# Patient Record
Sex: Female | Born: 1965
Health system: Southern US, Community
[De-identification: ages and names within clinical notes are randomized; demographics above are authoritative.]

## PROBLEM LIST (undated history)

## (undated) DIAGNOSIS — I8500 Esophageal varices without bleeding: Secondary | ICD-10-CM

## (undated) DIAGNOSIS — K709 Alcoholic liver disease, unspecified: Secondary | ICD-10-CM

## (undated) DIAGNOSIS — R17 Unspecified jaundice: Secondary | ICD-10-CM

## (undated) DIAGNOSIS — K219 Gastro-esophageal reflux disease without esophagitis: Secondary | ICD-10-CM

## (undated) DIAGNOSIS — M549 Dorsalgia, unspecified: Secondary | ICD-10-CM

## (undated) DIAGNOSIS — R519 Headache, unspecified: Secondary | ICD-10-CM

## (undated) DIAGNOSIS — K7460 Unspecified cirrhosis of liver: Secondary | ICD-10-CM

## (undated) DIAGNOSIS — G8929 Other chronic pain: Secondary | ICD-10-CM

## (undated) DIAGNOSIS — J948 Other specified pleural conditions: Secondary | ICD-10-CM

## (undated) HISTORY — DX: Other specified pleural conditions: J94.8

## (undated) HISTORY — DX: Gastro-esophageal reflux disease without esophagitis: K21.9

## (undated) HISTORY — DX: Alcoholic liver disease, unspecified: K70.9

## (undated) HISTORY — DX: Dorsalgia, unspecified: M54.9

## (undated) HISTORY — DX: Unspecified jaundice: R17

## (undated) HISTORY — PX: BACK SURGERY: SHX140

## (undated) HISTORY — DX: Other chronic pain: G89.29

## (undated) HISTORY — DX: Unspecified cirrhosis of liver: K74.60

---

## 2016-12-31 DIAGNOSIS — K7460 Unspecified cirrhosis of liver: Secondary | ICD-10-CM | POA: Diagnosis not present

## 2016-12-31 DIAGNOSIS — K760 Fatty (change of) liver, not elsewhere classified: Secondary | ICD-10-CM | POA: Diagnosis not present

## 2016-12-31 DIAGNOSIS — R188 Other ascites: Secondary | ICD-10-CM | POA: Diagnosis not present

## 2016-12-31 DIAGNOSIS — D649 Anemia, unspecified: Secondary | ICD-10-CM | POA: Diagnosis not present

## 2016-12-31 DIAGNOSIS — R140 Abdominal distension (gaseous): Secondary | ICD-10-CM | POA: Diagnosis not present

## 2017-01-01 DIAGNOSIS — I340 Nonrheumatic mitral (valve) insufficiency: Secondary | ICD-10-CM | POA: Diagnosis not present

## 2017-01-01 DIAGNOSIS — K7460 Unspecified cirrhosis of liver: Secondary | ICD-10-CM | POA: Diagnosis not present

## 2017-01-01 DIAGNOSIS — I10 Essential (primary) hypertension: Secondary | ICD-10-CM | POA: Diagnosis not present

## 2017-01-01 DIAGNOSIS — R188 Other ascites: Secondary | ICD-10-CM | POA: Diagnosis not present

## 2017-01-01 DIAGNOSIS — D509 Iron deficiency anemia, unspecified: Secondary | ICD-10-CM | POA: Diagnosis not present

## 2017-01-01 DIAGNOSIS — Z7289 Other problems related to lifestyle: Secondary | ICD-10-CM | POA: Diagnosis not present

## 2017-01-01 DIAGNOSIS — F329 Major depressive disorder, single episode, unspecified: Secondary | ICD-10-CM | POA: Diagnosis not present

## 2017-01-02 DIAGNOSIS — K7460 Unspecified cirrhosis of liver: Secondary | ICD-10-CM | POA: Diagnosis not present

## 2017-01-02 DIAGNOSIS — F329 Major depressive disorder, single episode, unspecified: Secondary | ICD-10-CM | POA: Diagnosis not present

## 2017-01-02 DIAGNOSIS — Z7289 Other problems related to lifestyle: Secondary | ICD-10-CM | POA: Diagnosis not present

## 2017-01-02 DIAGNOSIS — D509 Iron deficiency anemia, unspecified: Secondary | ICD-10-CM | POA: Diagnosis not present

## 2017-01-02 DIAGNOSIS — I10 Essential (primary) hypertension: Secondary | ICD-10-CM | POA: Diagnosis not present

## 2017-01-03 DIAGNOSIS — I10 Essential (primary) hypertension: Secondary | ICD-10-CM | POA: Diagnosis not present

## 2017-01-03 DIAGNOSIS — F329 Major depressive disorder, single episode, unspecified: Secondary | ICD-10-CM | POA: Diagnosis not present

## 2017-01-03 DIAGNOSIS — D509 Iron deficiency anemia, unspecified: Secondary | ICD-10-CM | POA: Diagnosis not present

## 2017-01-03 DIAGNOSIS — K7460 Unspecified cirrhosis of liver: Secondary | ICD-10-CM | POA: Diagnosis not present

## 2017-01-03 DIAGNOSIS — Z7289 Other problems related to lifestyle: Secondary | ICD-10-CM | POA: Diagnosis not present

## 2017-01-04 DIAGNOSIS — Z7289 Other problems related to lifestyle: Secondary | ICD-10-CM | POA: Diagnosis not present

## 2017-01-04 DIAGNOSIS — D509 Iron deficiency anemia, unspecified: Secondary | ICD-10-CM | POA: Diagnosis not present

## 2017-01-04 DIAGNOSIS — F329 Major depressive disorder, single episode, unspecified: Secondary | ICD-10-CM | POA: Diagnosis not present

## 2017-01-04 DIAGNOSIS — K649 Unspecified hemorrhoids: Secondary | ICD-10-CM | POA: Diagnosis not present

## 2017-01-04 DIAGNOSIS — K2960 Other gastritis without bleeding: Secondary | ICD-10-CM | POA: Diagnosis not present

## 2017-01-04 DIAGNOSIS — I8500 Esophageal varices without bleeding: Secondary | ICD-10-CM | POA: Diagnosis not present

## 2017-01-04 DIAGNOSIS — I864 Gastric varices: Secondary | ICD-10-CM | POA: Diagnosis not present

## 2017-01-04 DIAGNOSIS — K2950 Unspecified chronic gastritis without bleeding: Secondary | ICD-10-CM | POA: Diagnosis not present

## 2017-01-04 DIAGNOSIS — K7460 Unspecified cirrhosis of liver: Secondary | ICD-10-CM | POA: Diagnosis not present

## 2017-01-04 DIAGNOSIS — K449 Diaphragmatic hernia without obstruction or gangrene: Secondary | ICD-10-CM | POA: Diagnosis not present

## 2017-01-04 DIAGNOSIS — I10 Essential (primary) hypertension: Secondary | ICD-10-CM | POA: Diagnosis not present

## 2017-01-05 DIAGNOSIS — D509 Iron deficiency anemia, unspecified: Secondary | ICD-10-CM | POA: Diagnosis not present

## 2017-01-05 DIAGNOSIS — K7460 Unspecified cirrhosis of liver: Secondary | ICD-10-CM | POA: Diagnosis not present

## 2017-01-06 DIAGNOSIS — D509 Iron deficiency anemia, unspecified: Secondary | ICD-10-CM | POA: Diagnosis not present

## 2017-01-06 DIAGNOSIS — K7460 Unspecified cirrhosis of liver: Secondary | ICD-10-CM | POA: Diagnosis not present

## 2017-01-06 DIAGNOSIS — R188 Other ascites: Secondary | ICD-10-CM | POA: Diagnosis not present

## 2017-01-06 DIAGNOSIS — K7689 Other specified diseases of liver: Secondary | ICD-10-CM | POA: Diagnosis not present

## 2017-01-15 DIAGNOSIS — Z5181 Encounter for therapeutic drug level monitoring: Secondary | ICD-10-CM | POA: Diagnosis not present

## 2017-01-15 DIAGNOSIS — H538 Other visual disturbances: Secondary | ICD-10-CM | POA: Diagnosis not present

## 2017-01-15 DIAGNOSIS — I10 Essential (primary) hypertension: Secondary | ICD-10-CM | POA: Diagnosis not present

## 2017-01-15 DIAGNOSIS — Z01118 Encounter for examination of ears and hearing with other abnormal findings: Secondary | ICD-10-CM | POA: Diagnosis not present

## 2017-01-15 DIAGNOSIS — D638 Anemia in other chronic diseases classified elsewhere: Secondary | ICD-10-CM | POA: Diagnosis not present

## 2017-01-15 DIAGNOSIS — K7460 Unspecified cirrhosis of liver: Secondary | ICD-10-CM | POA: Diagnosis not present

## 2017-01-15 DIAGNOSIS — Z136 Encounter for screening for cardiovascular disorders: Secondary | ICD-10-CM | POA: Diagnosis not present

## 2017-01-15 DIAGNOSIS — Z131 Encounter for screening for diabetes mellitus: Secondary | ICD-10-CM | POA: Diagnosis not present

## 2017-01-15 DIAGNOSIS — Z720 Tobacco use: Secondary | ICD-10-CM | POA: Diagnosis not present

## 2017-01-15 DIAGNOSIS — I119 Hypertensive heart disease without heart failure: Secondary | ICD-10-CM | POA: Diagnosis not present

## 2017-01-15 DIAGNOSIS — Z113 Encounter for screening for infections with a predominantly sexual mode of transmission: Secondary | ICD-10-CM | POA: Diagnosis not present

## 2017-08-11 DIAGNOSIS — R079 Chest pain, unspecified: Secondary | ICD-10-CM | POA: Diagnosis not present

## 2017-08-11 DIAGNOSIS — K922 Gastrointestinal hemorrhage, unspecified: Secondary | ICD-10-CM | POA: Diagnosis not present

## 2017-08-11 DIAGNOSIS — E872 Acidosis: Secondary | ICD-10-CM | POA: Diagnosis not present

## 2017-08-11 DIAGNOSIS — I959 Hypotension, unspecified: Secondary | ICD-10-CM | POA: Diagnosis not present

## 2017-08-11 DIAGNOSIS — K7030 Alcoholic cirrhosis of liver without ascites: Secondary | ICD-10-CM | POA: Diagnosis not present

## 2017-08-11 DIAGNOSIS — R58 Hemorrhage, not elsewhere classified: Secondary | ICD-10-CM | POA: Diagnosis not present

## 2017-08-11 DIAGNOSIS — D500 Iron deficiency anemia secondary to blood loss (chronic): Secondary | ICD-10-CM | POA: Diagnosis not present

## 2017-08-11 DIAGNOSIS — J9811 Atelectasis: Secondary | ICD-10-CM | POA: Diagnosis not present

## 2017-08-11 DIAGNOSIS — I8501 Esophageal varices with bleeding: Secondary | ICD-10-CM | POA: Diagnosis not present

## 2017-08-12 DIAGNOSIS — I8501 Esophageal varices with bleeding: Secondary | ICD-10-CM | POA: Diagnosis not present

## 2017-08-12 DIAGNOSIS — K7460 Unspecified cirrhosis of liver: Secondary | ICD-10-CM | POA: Diagnosis not present

## 2017-08-12 DIAGNOSIS — J9600 Acute respiratory failure, unspecified whether with hypoxia or hypercapnia: Secondary | ICD-10-CM | POA: Diagnosis not present

## 2017-08-12 DIAGNOSIS — K7030 Alcoholic cirrhosis of liver without ascites: Secondary | ICD-10-CM | POA: Diagnosis not present

## 2017-08-12 DIAGNOSIS — K922 Gastrointestinal hemorrhage, unspecified: Secondary | ICD-10-CM | POA: Diagnosis not present

## 2017-08-12 DIAGNOSIS — R188 Other ascites: Secondary | ICD-10-CM | POA: Diagnosis not present

## 2017-08-13 DIAGNOSIS — K922 Gastrointestinal hemorrhage, unspecified: Secondary | ICD-10-CM | POA: Diagnosis not present

## 2017-08-13 DIAGNOSIS — I8501 Esophageal varices with bleeding: Secondary | ICD-10-CM | POA: Diagnosis not present

## 2017-08-13 DIAGNOSIS — K7030 Alcoholic cirrhosis of liver without ascites: Secondary | ICD-10-CM | POA: Diagnosis not present

## 2017-08-13 DIAGNOSIS — I361 Nonrheumatic tricuspid (valve) insufficiency: Secondary | ICD-10-CM | POA: Diagnosis not present

## 2017-08-13 DIAGNOSIS — R188 Other ascites: Secondary | ICD-10-CM | POA: Diagnosis not present

## 2017-08-14 DIAGNOSIS — K922 Gastrointestinal hemorrhage, unspecified: Secondary | ICD-10-CM | POA: Diagnosis not present

## 2017-08-15 DIAGNOSIS — K922 Gastrointestinal hemorrhage, unspecified: Secondary | ICD-10-CM | POA: Diagnosis not present

## 2017-08-16 DIAGNOSIS — K922 Gastrointestinal hemorrhage, unspecified: Secondary | ICD-10-CM | POA: Diagnosis not present

## 2017-08-17 DIAGNOSIS — K922 Gastrointestinal hemorrhage, unspecified: Secondary | ICD-10-CM | POA: Diagnosis not present

## 2017-08-18 DIAGNOSIS — K922 Gastrointestinal hemorrhage, unspecified: Secondary | ICD-10-CM | POA: Diagnosis not present

## 2017-08-18 DIAGNOSIS — R4182 Altered mental status, unspecified: Secondary | ICD-10-CM | POA: Diagnosis not present

## 2017-08-20 DIAGNOSIS — K922 Gastrointestinal hemorrhage, unspecified: Secondary | ICD-10-CM | POA: Diagnosis not present

## 2017-08-21 DIAGNOSIS — K922 Gastrointestinal hemorrhage, unspecified: Secondary | ICD-10-CM | POA: Diagnosis not present

## 2017-08-22 DIAGNOSIS — K922 Gastrointestinal hemorrhage, unspecified: Secondary | ICD-10-CM | POA: Diagnosis not present

## 2017-08-23 DIAGNOSIS — K922 Gastrointestinal hemorrhage, unspecified: Secondary | ICD-10-CM | POA: Diagnosis not present

## 2017-08-24 DIAGNOSIS — K922 Gastrointestinal hemorrhage, unspecified: Secondary | ICD-10-CM | POA: Diagnosis not present

## 2017-08-25 DIAGNOSIS — K922 Gastrointestinal hemorrhage, unspecified: Secondary | ICD-10-CM | POA: Diagnosis not present

## 2017-08-26 DIAGNOSIS — K922 Gastrointestinal hemorrhage, unspecified: Secondary | ICD-10-CM | POA: Diagnosis not present

## 2017-09-29 ENCOUNTER — Ambulatory Visit: Payer: Medicare Other | Admitting: Family Medicine

## 2017-10-08 ENCOUNTER — Ambulatory Visit: Payer: Medicare Other | Admitting: Family Medicine

## 2017-10-26 ENCOUNTER — Ambulatory Visit (INDEPENDENT_AMBULATORY_CARE_PROVIDER_SITE_OTHER): Payer: Medicare Other | Admitting: Family Medicine

## 2017-10-26 ENCOUNTER — Encounter: Payer: Self-pay | Admitting: Family Medicine

## 2017-10-26 VITALS — BP 162/88 | HR 90 | Resp 16 | Ht 64.0 in

## 2017-10-26 DIAGNOSIS — Z7689 Persons encountering health services in other specified circumstances: Secondary | ICD-10-CM

## 2017-10-26 DIAGNOSIS — F17210 Nicotine dependence, cigarettes, uncomplicated: Secondary | ICD-10-CM

## 2017-10-26 DIAGNOSIS — Z8719 Personal history of other diseases of the digestive system: Secondary | ICD-10-CM

## 2017-10-26 DIAGNOSIS — K7030 Alcoholic cirrhosis of liver without ascites: Secondary | ICD-10-CM | POA: Diagnosis not present

## 2017-10-26 DIAGNOSIS — F17200 Nicotine dependence, unspecified, uncomplicated: Secondary | ICD-10-CM | POA: Insufficient documentation

## 2017-10-26 DIAGNOSIS — K7031 Alcoholic cirrhosis of liver with ascites: Secondary | ICD-10-CM | POA: Insufficient documentation

## 2017-10-26 LAB — COMPREHENSIVE METABOLIC PANEL
ALBUMIN: 3.8 g/dL (ref 3.5–5.2)
ALK PHOS: 180 U/L — AB (ref 39–117)
ALT: 17 U/L (ref 0–35)
AST: 50 U/L — AB (ref 0–37)
BILIRUBIN TOTAL: 1.4 mg/dL — AB (ref 0.2–1.2)
BUN: 8 mg/dL (ref 6–23)
CHLORIDE: 101 meq/L (ref 96–112)
CO2: 25 mEq/L (ref 19–32)
CREATININE: 0.57 mg/dL (ref 0.40–1.20)
Calcium: 9.4 mg/dL (ref 8.4–10.5)
GFR: 142.79 mL/min (ref >60.00)
Glucose, Bld: 83 mg/dL (ref 70–99)
Potassium: 3.9 mEq/L (ref 3.5–5.1)
SODIUM: 140 meq/L (ref 135–145)
TOTAL PROTEIN: 7.8 g/dL (ref 6.0–8.3)

## 2017-10-26 LAB — CBC WITH DIFFERENTIAL/PLATELET
BASOS ABS: 0 10*3/uL (ref 0.0–0.1)
BASOS PCT: 1.2 % (ref 0.0–3.0)
EOS ABS: 0.2 10*3/uL (ref 0.0–0.7)
Eosinophils Relative: 4.2 % (ref 0.0–5.0)
HEMATOCRIT: 34.8 % — AB (ref 36.0–46.0)
HEMOGLOBIN: 11.2 g/dL — AB (ref 12.0–15.0)
LYMPHS PCT: 37.2 % (ref 12.0–46.0)
Lymphs Abs: 1.3 10*3/uL (ref 0.7–4.0)
MCHC: 32.2 g/dL (ref 30.0–36.0)
MCV: 76.8 fl — ABNORMAL LOW (ref 78.0–100.0)
MONO ABS: 0.6 10*3/uL (ref 0.1–1.0)
Monocytes Relative: 16.3 % — ABNORMAL HIGH (ref 3.0–12.0)
Neutro Abs: 1.5 10*3/uL (ref 1.4–7.7)
Neutrophils Relative %: 41.1 % — ABNORMAL LOW (ref 43.0–77.0)
Platelets: 143 10*3/uL — ABNORMAL LOW (ref 150.0–400.0)
RBC: 4.54 Mil/uL (ref 3.87–5.11)
RDW: 20.8 % — AB (ref 11.5–15.5)
WBC: 3.6 10*3/uL — AB (ref 4.0–10.5)

## 2017-10-26 LAB — AMMONIA: AMMONIA: 85 umol/L — AB (ref 11–35)

## 2017-10-26 MED ORDER — LACTULOSE 20 GM/30ML PO SOLN: 30.0000 mL | Freq: Two times a day (BID) | ORAL | 3 refills | Status: DC

## 2017-10-26 MED ORDER — PANTOPRAZOLE SODIUM 40 MG PO TBEC: 40.0000 mg | DELAYED_RELEASE_TABLET | Freq: Every day | ORAL | 3 refills | Status: DC

## 2017-10-26 NOTE — Progress Notes (Signed)
Patient presents to clinic today to establish care.  Patient is accompanied by her daughter  SUBJECTIVE: PMH: Patient is a 52 year old female with past medical history significant for recent GI bleed, liver failure, cirrhosis, alcohol abuse, tobacco use.  Patient was briefly seen and [facility].  Patient endorses recent hospitalization for GIB while visiting [place], [place].  Alcoholic cirrhosis/ recent GIB: -Patient endorses ongoing history of alcohol use. -Currently drinking a sixpack of beer per day.  Patient's daughter thinks she is drinking more. -During recent hospitalization 08/26/2017 pt informed of liver damage 2/2 alcohol use she had 2/2 esophageal varices -Patient started on lactulose 30 mL 3 times daily, Protonix 40 mg daily, propranolol every 8 hours hold for systolic blood pressure less than 90, Spironolactone 50 mg daily, thiamine daily  Allergies: Sulfa-swelling  Past surgical history Back surgery-had rods put in place  Social history: Patient is single.  She has 8 children, 6 boys and 2 girls.  Patient has 2 grandchildren.  Patient is currently retired/disabled.  Patient endorses alcohol use.  Patient endorses recent tobacco use.  Smoking approximately 5 cigarettes/day.  Patient states 1 pack for last 3 to 4 days.  Patient denies drug use.  Family medical history: Mom-alive, thyroid issues, arthritis Dad-deceased MGM-cancer  Health Maintenance: Immunizations --pneumonia and influenza vaccine given during hospitalization March 2019 Mammogram --2 years ago PAP --2 years ago    Past Medical History:  Diagnosis Date  . Alcoholic liver disease ([place])   . Chronic back pain   . Cirrhosis of liver ([place])   . GERD (gastroesophageal reflux disease)   . Jaundice     Current Outpatient Medications on File Prior to Visit  Medication Sig Dispense Refill  . Lactulose 20 GM/30ML SOLN Take 30 mLs by mouth. Take 30 mls every eight hours. Titrate for 3 bm per  24 hrs    . pantoprazole (PROTONIX) 40 MG tablet Take 40 mg by mouth daily.    . propranolol (INDERAL) 10 MG tablet Take 10 mg by mouth every 8 (eight) hours. Hold for SBP <90    . spironolactone (ALDACTONE) 25 MG tablet Take 25 mg by mouth daily. Take 2 oral daily    . Thiamine Mononitrate 100 MG TABS Take by mouth.     No current facility-administered medications on file prior to visit.     Allergies  Allergen Reactions  . Contrast Media [Iodinated Diagnostic Agents]   . Latex   . Sulfa Antibiotics     No family history on file.  Social History   Socioeconomic History  . Marital status: Single    Spouse name: Not on file  . Number of children: Not on file  . Years of education: Not on file  . Highest education level: Not on file  Occupational History  . Not on file  Social Needs  . Financial resource strain: Not on file  . Food insecurity:    Worry: Not on file    Inability: Not on file  . Transportation needs:    Medical: Not on file    Non-medical: Not on file  Tobacco Use  . Smoking status: Current Every Day Smoker  . Smokeless tobacco: Never Used  Substance and Sexual Activity  . Alcohol use: Yes  . Drug use: Not Currently  . Sexual activity: Not on file  Lifestyle  . Physical activity:    Days per week: Not on file    Minutes per session: Not on file  . Stress:  Not on file  Relationships  . Social connections:    Talks on phone: Not on file    Gets together: Not on file    Attends religious service: Not on file    Active member of club or organization: Not on file    Attends meetings of clubs or organizations: Not on file    Relationship status: Not on file  . Intimate partner violence:    Fear of current or ex partner: Not on file    Emotionally abused: Not on file    Physically abused: Not on file    Forced sexual activity: Not on file  Other Topics Concern  . Not on file  Social History Narrative  . Not on file    ROS General: Denies  fever, chills, night sweats, changes in weight, changes in appetite HEENT: Denies headaches, ear pain, changes in vision, rhinorrhea, sore throat CV: Denies CP, palpitations, SOB, orthopnea Pulm: Denies SOB, cough, wheezing GI: Denies abdominal pain, nausea, vomiting, diarrhea, constipation GU: Denies dysuria, hematuria, frequency, vaginal discharge Msk: Denies muscle cramps, joint pains Neuro: Denies weakness, numbness, tingling Skin: Denies rashes, bruising Psych: Denies depression, anxiety, hallucinations  BP (!) 162/88 (BP Location: Left Arm, Patient Position: Sitting, Cuff Size: Large)   Pulse 90   Resp 16   Ht 5\' 4"  (1.626 m)   SpO2 97%   Physical Exam Gen. Pleasant, well developed, well-nourished, in NAD HEENT - [place]/AT, PERRL, conjunctive clear, no scleral icterus, no nasal drainage, pharynx without erythema or exudate.  TMs normal bilaterally.  No cervical lymphadenopathy. Neck: No JVD, no thyromegaly, no carotid bruits Lungs: no use of accessory muscles, CTAB, no wheezes, rales or rhonchi Cardiovascular: RRR, No r/g/m, no peripheral edema Abdomen: BS present, soft, nontender,nondistended, no hepatosplenomegaly Neuro:  A&Ox3, CN II-XII intact, normal gait Skin:  Warm, dry, intact, no lesions Psych: normal affect, mood appropriate, patient hesitant/in denial about her current health problems, tearful at times  No results found for this or any previous visit (from the past 2160 hour(s)).  Assessment/Plan: Alcoholic cirrhosis of liver without ascites ([place]) -Patient advised to refrain from alcohol use.  Patient advised she may have to cut down the amount she is drinking daily to avoid DTs. -Given handout -We will obtain labs this visit  - Plan: Ambulatory referral to Gastroenterology, Comprehensive metabolic panel, CBC with Differential/Platelet, Ammonia  History of GI bleed -Continue Protonix 40 mg daily -Patient given RTC or ED precautions for recurrent bleeding -We will  place referral for GI  Nicotine use -Smoking cessation counseling greater than 3 minutes, less than 10 minutes -Patient smoking approximately 5 cigarettes/day -Discussed cutting down -Patient declines quit aids at this time. -We will readdress at each visit  Encounter to establish care -We reviewed the PMH, PSH, FH, SH, Meds and Allergies. -We provided refills for any medications we will prescribe as needed. -We addressed current concerns per orders and patient instructions. -We have asked for records for pertinent exams, studies, vaccines and notes from previous providers. -We have advised patient to follow up per instructions below.  Follow-up in the next month, sooner if needed  Grier Mitts, MD

## 2017-10-26 NOTE — Patient Instructions (Signed)
Cirrhosis Cirrhosis is long-term (chronic) liver injury. The liver is your largest internal organ, and it performs many functions. The liver converts food into energy, removes toxic material from your blood, makes important proteins, and absorbs necessary vitamins from your diet. If you have cirrhosis, it means many of your healthy liver cells have been replaced by scar tissue. This prevents blood from flowing through your liver, which makes it difficult for your liver to function. This scarring is not reversible, but treatment can prevent it from getting worse. What are the causes? Hepatitis C and long-term alcohol abuse are the most common causes of cirrhosis. Other causes include:  Nonalcoholic fatty liver disease.  Hepatitis B infection.  Autoimmune hepatitis.  Diseases that cause blockage of ducts inside the liver.  Inherited liver diseases.  Reactions to certain long-term medicines.  Parasitic infections.  Long-term exposure to certain toxins.  What increases the risk? You may have a higher risk of cirrhosis if you:  Have certain hepatitis viruses.  Abuse alcohol, especially if you are female.  Are overweight.  Share needles.  Have unprotected sex with someone who has hepatitis.  What are the signs or symptoms? You may not have any signs and symptoms at first. Symptoms may not develop until the damage to your liver starts to get worse. Signs and symptoms of cirrhosis may include:  Tenderness in the right-upper part of your abdomen.  Weakness and tiredness (fatigue).  Loss of appetite.  Nausea.  Weight loss and muscle loss.  Itchiness.  Yellow skin and eyes (jaundice).  Buildup of fluid in the abdomen (ascites).  Swelling of the feet and ankles (edema).  Appearance of tiny blood vessels under the skin.  Mental confusion.  Easy bruising and bleeding.  How is this diagnosed? Your health care provider may suspect cirrhosis based on your symptoms and  medical history, especially if you have other medical conditions or a history of alcohol abuse. Your health care provider will do a physical exam to feel your liver and check for signs of cirrhosis. Your health care provider may perform other tests, including:  Blood tests to check: ? Whether you have hepatitis B or C. ? Kidney function. ? Liver function.  Imaging tests such as: ? MRI or CT scan to look for changes seen in advanced cirrhosis. ? Ultrasound to see if normal liver tissue is being replaced by scar tissue.  A procedure using a long needle to take a sample of liver tissue (biopsy) for examination under a microscope. Liver biopsy can confirm the diagnosis of cirrhosis.  How is this treated? Treatment depends on how damaged your liver is and what caused the damage. Treatment may include treating cirrhosis symptoms or treating the underlying causes of the condition to try to slow the progression of the damage. Treatment may include:  Making lifestyle changes, such as: ? Eating a healthy diet. ? Restricting salt intake. ? Maintaining a healthy weight. ? Not abusing drugs or alcohol.  Taking medicines to: ? Treat liver infections or other infections. ? Control itching. ? Reduce fluid buildup. ? Reduce certain blood toxins. ? Reduce risk of bleeding from enlarged blood vessels in the stomach or esophagus (varices).  If varices are causing bleeding problems, you may need treatment with a procedure that ties up the vessels causing them to fall off (band ligation).  If cirrhosis is causing your liver to fail, your health care provider may recommend a liver transplant.  Other treatments may be recommended depending on any complications   of cirrhosis, such as liver-related kidney failure (hepatorenal syndrome).  Follow these instructions at home:  Take medicines only as directed by your health care provider. Do not use drugs that are toxic to your liver. Ask your health care  provider before taking any new medicines, including over-the-counter medicines.  Rest as needed.  Eat a well-balanced diet. Ask your health care provider or dietitian for more information.  You may have to follow a low-salt diet or restrict your water intake as directed.  Do not drink alcohol. This is especially important if you are taking acetaminophen.  Keep all follow-up visits as directed by your health care provider. This is important. Contact a health care provider if:  You have fatigue or weakness that is getting worse.  You develop swelling of the hands, feet, legs, or face.  You have a fever.  You develop loss of appetite.  You have nausea or vomiting.  You develop jaundice.  You develop easy bruising or bleeding. Get help right away if:  You vomit bright red blood or a material that looks like coffee grounds.  You have blood in your stools.  Your stools appear black and tarry.  You become confused.  You have chest pain or trouble breathing. This information is not intended to replace advice given to you by your health care provider. Make sure you discuss any questions you have with your health care provider. Document Released: 06/09/2005 Document Revised: 10/18/2015 Document Reviewed: 02/15/2014 Elsevier Interactive Patient Education  2018 [employer]. Alcohol Abuse and Nutrition Alcohol abuse is any pattern of alcohol consumption that harms your health, relationships, or work. Alcohol abuse can affect how your body breaks down and absorbs nutrients from food by causing your liver to work abnormally. Additionally, many people who abuse alcohol do not eat enough carbohydrates, protein, fat, vitamins, and minerals. This can cause poor nutrition (malnutrition) and a lack of nutrients (nutrient deficiencies), which can lead to further complications. Nutrients that are commonly lacking (deficient) among people who abuse alcohol include:  Vitamins. ? Vitamin A. This  is stored in your liver. It is important for your vision, metabolism, and ability to fight off infections (immunity). ? B vitamins. These include vitamins such as folate, thiamin, and niacin. These are important in new cell growth and maintenance. ? Vitamin C. This plays an important role in iron absorption, wound healing, and immunity. ? Vitamin D. This is produced by your liver, but you can also get vitamin D from food. Vitamin D is necessary for your body to absorb and use calcium.  Minerals. ? Calcium. This is important for your bones and your heart and blood vessel (cardiovascular) function. ? Iron. This is important for blood, muscle, and nervous system functioning. ? Magnesium. This plays an important role in muscle and nerve function, and it helps to control blood sugar and blood pressure. ? Zinc. This is important for the normal function of your nervous system and digestive system (gastrointestinal tract).  Nutrition is an essential component of therapy for alcohol abuse. Your health care provider or dietitian will work with you to design a plan that can help restore nutrients to your body and prevent potential complications. What is my plan? Your dietitian may develop a specific diet plan that is based on your condition and any other complications you may have. A diet plan will commonly include:  A balanced diet. ? Grains: 6-8 oz per day. ? Vegetables: 2-3 cups per day. ? Fruits: 1-2 cups per day. ?  Meat and other protein: 5-6 oz per day. ? Dairy: 2-3 cups per day.  Vitamin and mineral supplements.  What do I need to know about alcohol and nutrition?  Consume foods that are high in antioxidants, such as grapes, berries, nuts, green tea, and dark green and orange vegetables. This can help to counteract some of the stress that is placed on your liver by consuming alcohol.  Avoid food and drinks that are high in fat and sugar. Foods such as sugared soft drinks, salty snack foods,  and candy contain empty calories. This means that they lack important nutrients such as protein, fiber, and vitamins.  Eat frequent meals and snacks. Try to eat 5-6 small meals each day.  Eat a variety of fresh fruits and vegetables each day. This will help you get plenty of water, fiber, and vitamins in your diet.  Drink plenty of water and other clear fluids. Try to drink at least 48-64 oz (1.5-2 L) of water per day.  If you are a vegetarian, eat a variety of protein-rich foods. Pair whole grains with plant-based proteins at meals and snacks to obtain the greatest nutrient benefit from your food. For example, eat rice with beans, put peanut butter on whole-grain toast, or eat oatmeal with sunflower seeds.  Soak beans and whole grains overnight before cooking. This can help your body to absorb the nutrients more easily.  Include foods fortified with vitamins and minerals in your diet. Commonly fortified foods include milk, orange juice, cereal, and bread.  If you are malnourished, your dietitian may recommend a high-protein, high-calorie diet. This may include: ? 2,000-3,000 calories (kilocalories) per day. ? 70-100 grams of protein per day.  Your health care provider may recommend a complete nutritional supplement beverage. This can help to restore calories, protein, and vitamins to your body. Depending on your condition, you may be advised to consume this instead of or in addition to meals.  Limit your intake of caffeine. Replace drinks like coffee and black tea with decaffeinated coffee and herbal tea.  Eat a variety of foods that are high in omega fatty acids. These include fish, nuts and seeds, and soybeans. These foods may help your liver to recover and may also stabilize your mood.  Certain medicines may cause changes in your appetite, taste, and weight. Work with your health care provider and dietitian to make any adjustments to your medicines and diet plan.  Include other healthy  lifestyle choices in your daily routine. ? Be physically active. ? Get enough sleep. ? Spend time doing activities that you enjoy.  If you are unable to take in enough food and calories by mouth, your health care provider may recommend a feeding tube. This is a tube that passes through your nose and throat, directly into your stomach. Nutritional supplement beverages can be given to you through the feeding tube to help you get the nutrients you need.  Take vitamin or mineral supplements as recommended by your health care provider. What foods can I eat? Grains Enriched pasta. Enriched rice. Fortified whole-grain bread. Fortified whole-grain cereal. Barley. Brown rice. Quinoa. [place]. Vegetables All fresh, frozen, and canned vegetables. Spinach. Kale. Artichoke. Carrots. Winter squash and pumpkin. Sweet potatoes. Broccoli. Cabbage. Cucumbers. Tomatoes. Sweet peppers. Green beans. Peas. Corn. Fruits All fresh and frozen fruits. Berries. Grapes. Mango. Papaya. Guava. Cherries. Apples. Bananas. Peaches. Plums. Pineapple. Watermelon. Cantaloupe. Oranges. Avocado. Meats and Other Protein Sources Beef liver. Lean beef. Pork. Fresh and canned chicken. Fresh fish. Oysters. Sardines.  Canned tuna. Shrimp. Eggs with yolks. Nuts and seeds. Peanut butter. Beans and lentils. Soybeans. Tofu. Dairy Whole, low-fat, and nonfat milk. Whole, low-fat, and nonfat yogurt. Cottage cheese. Sour cream. Hard and soft cheeses. Beverages Water. Herbal tea. Decaffeinated coffee. Decaffeinated green tea. 100% fruit juice. 100% vegetable juice. Instant breakfast shakes. Condiments Ketchup. Mayonnaise. Mustard. Salad dressing. Barbecue sauce. Sweets and Desserts Sugar-free ice cream. Sugar-free pudding. Sugar-free gelatin. Fats and Oils Butter. Vegetable oil, flaxseed oil, olive oil, and walnut oil. Other Complete nutrition shakes. Protein bars. Sugar-free gum. The items listed above may not be a complete list of  recommended foods or beverages. Contact your dietitian for more options. What foods are not recommended? Grains Sugar-sweetened breakfast cereals. Flavored instant oatmeal. Fried breads. Vegetables Breaded or deep-fried vegetables. Fruits Dried fruit with added sugar. Candied fruit. Canned fruit in syrup. Meats and Other Protein Sources Breaded or deep-fried meats. Dairy Flavored milks. Fried cheese curds or fried cheese sticks. Beverages Alcohol. Sugar-sweetened soft drinks. Sugar-sweetened tea. Caffeinated coffee and tea. Condiments Sugar. Honey. Agave nectar. Molasses. Sweets and Desserts Chocolate. Cake. Cookies. Candy. Other Potato chips. Pretzels. Salted nuts. Candied nuts. The items listed above may not be a complete list of foods and beverages to avoid. Contact your dietitian for more information. This information is not intended to replace advice given to you by your health care provider. Make sure you discuss any questions you have with your health care provider. Document Released: 04/03/2005 Document Revised: 10/17/2015 Document Reviewed: 01/10/2014 Elsevier Interactive Patient Education  [employer].

## 2017-10-27 ENCOUNTER — Encounter: Payer: Self-pay | Admitting: Internal Medicine

## 2017-12-28 ENCOUNTER — Ambulatory Visit: Payer: Medicare Other | Admitting: Internal Medicine

## 2017-12-28 ENCOUNTER — Encounter: Payer: Self-pay | Admitting: Family Medicine

## 2018-04-24 ENCOUNTER — Encounter (HOSPITAL_COMMUNITY): Payer: Self-pay | Admitting: Emergency Medicine

## 2018-04-24 ENCOUNTER — Inpatient Hospital Stay (HOSPITAL_COMMUNITY)
Admission: EM | Admit: 2018-04-24 | Discharge: 2018-05-03 | DRG: 432 | Disposition: A | Discharge status: Home Health | Payer: Medicare Other | Attending: Internal Medicine | Admitting: Internal Medicine

## 2018-04-24 DIAGNOSIS — K7011 Alcoholic hepatitis with ascites: Secondary | ICD-10-CM | POA: Diagnosis present

## 2018-04-24 DIAGNOSIS — Z79899 Other long term (current) drug therapy: Secondary | ICD-10-CM

## 2018-04-24 DIAGNOSIS — K7290 Hepatic failure, unspecified without coma: Secondary | ICD-10-CM | POA: Diagnosis not present

## 2018-04-24 DIAGNOSIS — I8501 Esophageal varices with bleeding: Secondary | ICD-10-CM | POA: Diagnosis not present

## 2018-04-24 DIAGNOSIS — F1010 Alcohol abuse, uncomplicated: Secondary | ICD-10-CM | POA: Diagnosis not present

## 2018-04-24 DIAGNOSIS — F10239 Alcohol dependence with withdrawal, unspecified: Secondary | ICD-10-CM | POA: Diagnosis present

## 2018-04-24 DIAGNOSIS — K7460 Unspecified cirrhosis of liver: Secondary | ICD-10-CM | POA: Diagnosis not present

## 2018-04-24 DIAGNOSIS — R188 Other ascites: Secondary | ICD-10-CM | POA: Diagnosis not present

## 2018-04-24 DIAGNOSIS — D62 Acute posthemorrhagic anemia: Secondary | ICD-10-CM | POA: Diagnosis present

## 2018-04-24 DIAGNOSIS — F1020 Alcohol dependence, uncomplicated: Secondary | ICD-10-CM | POA: Diagnosis not present

## 2018-04-24 DIAGNOSIS — D696 Thrombocytopenia, unspecified: Secondary | ICD-10-CM | POA: Diagnosis present

## 2018-04-24 DIAGNOSIS — R000 Tachycardia, unspecified: Secondary | ICD-10-CM | POA: Diagnosis not present

## 2018-04-24 DIAGNOSIS — I8511 Secondary esophageal varices with bleeding: Secondary | ICD-10-CM | POA: Diagnosis not present

## 2018-04-24 DIAGNOSIS — I8500 Esophageal varices without bleeding: Secondary | ICD-10-CM | POA: Diagnosis not present

## 2018-04-24 DIAGNOSIS — G8929 Other chronic pain: Secondary | ICD-10-CM | POA: Diagnosis present

## 2018-04-24 DIAGNOSIS — E872 Acidosis, unspecified: Secondary | ICD-10-CM

## 2018-04-24 DIAGNOSIS — K219 Gastro-esophageal reflux disease without esophagitis: Secondary | ICD-10-CM | POA: Diagnosis not present

## 2018-04-24 DIAGNOSIS — K922 Gastrointestinal hemorrhage, unspecified: Secondary | ICD-10-CM | POA: Diagnosis not present

## 2018-04-24 DIAGNOSIS — R109 Unspecified abdominal pain: Secondary | ICD-10-CM | POA: Diagnosis present

## 2018-04-24 DIAGNOSIS — R791 Abnormal coagulation profile: Secondary | ICD-10-CM | POA: Diagnosis present

## 2018-04-24 DIAGNOSIS — E7220 Disorder of urea cycle metabolism, unspecified: Secondary | ICD-10-CM | POA: Diagnosis not present

## 2018-04-24 DIAGNOSIS — F17210 Nicotine dependence, cigarettes, uncomplicated: Secondary | ICD-10-CM | POA: Diagnosis present

## 2018-04-24 DIAGNOSIS — Z882 Allergy status to sulfonamides status: Secondary | ICD-10-CM

## 2018-04-24 DIAGNOSIS — K7031 Alcoholic cirrhosis of liver with ascites: Principal | ICD-10-CM

## 2018-04-24 DIAGNOSIS — T424X5A Adverse effect of benzodiazepines, initial encounter: Secondary | ICD-10-CM | POA: Diagnosis not present

## 2018-04-24 DIAGNOSIS — K920 Hematemesis: Secondary | ICD-10-CM

## 2018-04-24 DIAGNOSIS — Y92230 Patient room in hospital as the place of occurrence of the external cause: Secondary | ICD-10-CM | POA: Diagnosis not present

## 2018-04-24 DIAGNOSIS — E876 Hypokalemia: Secondary | ICD-10-CM

## 2018-04-24 DIAGNOSIS — Z91040 Latex allergy status: Secondary | ICD-10-CM

## 2018-04-24 DIAGNOSIS — K3189 Other diseases of stomach and duodenum: Secondary | ICD-10-CM | POA: Diagnosis not present

## 2018-04-24 DIAGNOSIS — Z91041 Radiographic dye allergy status: Secondary | ICD-10-CM

## 2018-04-24 DIAGNOSIS — K921 Melena: Secondary | ICD-10-CM

## 2018-04-24 DIAGNOSIS — E871 Hypo-osmolality and hyponatremia: Secondary | ICD-10-CM

## 2018-04-24 DIAGNOSIS — M549 Dorsalgia, unspecified: Secondary | ICD-10-CM | POA: Diagnosis present

## 2018-04-24 DIAGNOSIS — D649 Anemia, unspecified: Secondary | ICD-10-CM

## 2018-04-24 DIAGNOSIS — E43 Unspecified severe protein-calorie malnutrition: Secondary | ICD-10-CM | POA: Diagnosis present

## 2018-04-24 DIAGNOSIS — K7030 Alcoholic cirrhosis of liver without ascites: Secondary | ICD-10-CM

## 2018-04-24 LAB — CBC WITH DIFFERENTIAL/PLATELET
Abs Immature Granulocytes: 0.02 10*3/uL (ref 0.00–0.07)
Basophils Absolute: 0 10*3/uL (ref 0.0–0.1)
Basophils Relative: 1 %
Eosinophils Absolute: 0 10*3/uL (ref 0.0–0.5)
Eosinophils Relative: 1 %
HCT: 28.2 % — ABNORMAL LOW (ref 36.0–46.0)
Hemoglobin: 9.6 g/dL — ABNORMAL LOW (ref 12.0–15.0)
Immature Granulocytes: 0 %
Lymphocytes Relative: 19 %
Lymphs Abs: 1.1 10*3/uL (ref 0.7–4.0)
MCH: 29.3 pg (ref 26.0–34.0)
MCHC: 34 g/dL (ref 30.0–36.0)
MCV: 86 fL (ref 80.0–100.0)
Monocytes Absolute: 0.8 10*3/uL (ref 0.1–1.0)
Monocytes Relative: 14 %
Neutro Abs: 4 10*3/uL (ref 1.7–7.7)
Neutrophils Relative %: 65 %
Platelets: DECREASED 10*3/uL (ref 150–400)
RBC: 3.28 MIL/uL — ABNORMAL LOW (ref 3.87–5.11)
RDW: 22.4 % — ABNORMAL HIGH (ref 11.5–15.5)
WBC: 6.1 10*3/uL (ref 4.0–10.5)
nRBC: 0 % (ref 0.0–0.2)

## 2018-04-24 LAB — I-STAT BETA HCG BLOOD, ED (MC, WL, AP ONLY): I-stat hCG, quantitative: <5 m[IU]/mL (ref <5)

## 2018-04-24 LAB — COMPREHENSIVE METABOLIC PANEL
ALT: 32 U/L (ref 0–44)
AST: 80 U/L — ABNORMAL HIGH (ref 15–41)
Albumin: 1.9 g/dL — ABNORMAL LOW (ref 3.5–5.0)
Alkaline Phosphatase: 184 U/L — ABNORMAL HIGH (ref 38–126)
Anion gap: 15 (ref 5–15)
BUN: 7 mg/dL (ref 6–20)
CO2: 19 mmol/L — ABNORMAL LOW (ref 22–32)
Calcium: 8.4 mg/dL — ABNORMAL LOW (ref 8.9–10.3)
Chloride: 99 mmol/L (ref 98–111)
Creatinine, Ser: 0.9 mg/dL (ref 0.44–1.00)
GFR calc Af Amer: >60 mL/min (ref >60)
GFR calc non Af Amer: >60 mL/min (ref >60)
Glucose, Bld: 95 mg/dL (ref 70–99)
Potassium: 3.3 mmol/L — ABNORMAL LOW (ref 3.5–5.1)
Sodium: 133 mmol/L — ABNORMAL LOW (ref 135–145)
Total Bilirubin: 8.9 mg/dL — ABNORMAL HIGH (ref 0.3–1.2)
Total Protein: 6.7 g/dL (ref 6.5–8.1)

## 2018-04-24 LAB — CBC
HCT: 27.1 % — ABNORMAL LOW (ref 36.0–46.0)
HEMOGLOBIN: 9.3 g/dL — AB (ref 12.0–15.0)
MCH: 29.5 pg (ref 26.0–34.0)
MCHC: 34.3 g/dL (ref 30.0–36.0)
MCV: 86 fL (ref 80.0–100.0)
NRBC: 0 % (ref 0.0–0.2)
PLATELETS: 81 10*3/uL — AB (ref 150–400)
RBC: 3.15 MIL/uL — ABNORMAL LOW (ref 3.87–5.11)
RDW: 22.5 % — ABNORMAL HIGH (ref 11.5–15.5)
WBC: 6.2 10*3/uL (ref 4.0–10.5)

## 2018-04-24 LAB — PROTIME-INR
INR: 2.4
PROTHROMBIN TIME: 25.8 s — AB (ref 11.4–15.2)

## 2018-04-24 LAB — LIPASE, BLOOD: Lipase: 24 U/L (ref 11–51)

## 2018-04-24 MED ORDER — ONDANSETRON HCL 4 MG/2ML IJ SOLN
4.0000 mg | Freq: Four times a day (QID) | INTRAMUSCULAR | Status: DC | PRN
  Administered 2018-04-25 – 2018-04-28 (×2): 4 mg via INTRAVENOUS
  Filled 2018-04-24 (×2): qty 2

## 2018-04-24 MED ORDER — SODIUM CHLORIDE 0.9 % IV SOLN
50.0000 ug/h | INTRAVENOUS | Status: DC
  Administered 2018-04-24 – 2018-04-28 (×8): 50 ug/h via INTRAVENOUS
  Filled 2018-04-24 (×16): qty 1

## 2018-04-24 MED ORDER — VITAMIN K1 10 MG/ML IJ SOLN
10.0000 mg | Freq: Once | INTRAVENOUS | Status: AC
  Administered 2018-04-24: 10 mg via INTRAVENOUS
  Filled 2018-04-24: qty 1

## 2018-04-24 MED ORDER — ONDANSETRON HCL 4 MG/2ML IJ SOLN
4.0000 mg | Freq: Once | INTRAMUSCULAR | Status: AC
  Administered 2018-04-24: 4 mg via INTRAVENOUS
  Filled 2018-04-24: qty 2

## 2018-04-24 MED ORDER — PANTOPRAZOLE SODIUM 40 MG IV SOLR
40.0000 mg | Freq: Once | INTRAVENOUS | Status: AC
  Administered 2018-04-24: 40 mg via INTRAVENOUS
  Filled 2018-04-24: qty 40

## 2018-04-24 MED ORDER — PANTOPRAZOLE SODIUM 40 MG IV SOLR
40.0000 mg | Freq: Two times a day (BID) | INTRAVENOUS | Status: DC
  Administered 2018-04-25 – 2018-04-29 (×9): 40 mg via INTRAVENOUS
  Filled 2018-04-24 (×9): qty 40

## 2018-04-24 MED ORDER — LORAZEPAM 2 MG/ML IJ SOLN
2.0000 mg | INTRAMUSCULAR | Status: DC | PRN
  Administered 2018-04-25 – 2018-04-26 (×6): 2 mg via INTRAVENOUS
  Filled 2018-04-24: qty 1
  Filled 2018-04-24: qty 2
  Filled 2018-04-24 (×3): qty 1

## 2018-04-24 MED ORDER — POTASSIUM CHLORIDE CRYS ER 20 MEQ PO TBCR
40.0000 meq | EXTENDED_RELEASE_TABLET | Freq: Once | ORAL | Status: AC
  Administered 2018-04-24: 40 meq via ORAL
  Filled 2018-04-24: qty 2

## 2018-04-24 MED ORDER — LORAZEPAM 1 MG PO TABS: 1.0000 mg | ORAL_TABLET | Freq: Four times a day (QID) | ORAL | Status: DC | PRN

## 2018-04-24 MED ORDER — MORPHINE SULFATE (PF) 4 MG/ML IV SOLN
4.0000 mg | Freq: Once | INTRAVENOUS | Status: AC
  Administered 2018-04-24: 4 mg via INTRAVENOUS
  Filled 2018-04-24: qty 1

## 2018-04-24 MED ORDER — OCTREOTIDE LOAD VIA INFUSION
50.0000 ug | Freq: Once | INTRAVENOUS | Status: AC
  Administered 2018-04-24: 50 ug via INTRAVENOUS
  Filled 2018-04-24: qty 25

## 2018-04-24 MED ORDER — ADULT MULTIVITAMIN W/MINERALS CH
1.0000 | ORAL_TABLET | Freq: Every day | ORAL | Status: DC
  Administered 2018-04-26 – 2018-05-03 (×8): 1 via ORAL
  Filled 2018-04-24 (×8): qty 1

## 2018-04-24 MED ORDER — BARIUM SULFATE 2.1 % PO SUSP
ORAL | Status: AC
  Filled 2018-04-24: qty 2

## 2018-04-24 MED ORDER — FENTANYL CITRATE (PF) 100 MCG/2ML IJ SOLN
25.0000 ug | INTRAMUSCULAR | Status: DC | PRN
  Administered 2018-04-25: 25 ug via INTRAVENOUS
  Filled 2018-04-24: qty 2

## 2018-04-24 MED ORDER — FENTANYL CITRATE (PF) 100 MCG/2ML IJ SOLN
50.0000 ug | Freq: Once | INTRAMUSCULAR | Status: AC
  Administered 2018-04-24: 50 ug via INTRAVENOUS
  Filled 2018-04-24: qty 2

## 2018-04-24 MED ORDER — SODIUM CHLORIDE 0.9 % IV SOLN
1.0000 g | INTRAVENOUS | Status: DC
  Administered 2018-04-24 – 2018-04-30 (×6): 1 g via INTRAVENOUS
  Filled 2018-04-24 (×6): qty 10

## 2018-04-24 MED ORDER — THIAMINE HCL 100 MG/ML IJ SOLN
100.0000 mg | Freq: Every day | INTRAMUSCULAR | Status: DC
  Administered 2018-04-25 – 2018-04-26 (×2): 100 mg via INTRAVENOUS
  Filled 2018-04-24 (×5): qty 2

## 2018-04-24 MED ORDER — FOLIC ACID 1 MG PO TABS
1.0000 mg | ORAL_TABLET | Freq: Every day | ORAL | Status: DC
  Administered 2018-04-26 – 2018-05-03 (×8): 1 mg via ORAL
  Filled 2018-04-24 (×9): qty 1

## 2018-04-24 MED ORDER — LORAZEPAM 2 MG/ML IJ SOLN: 1.0000 mg | Freq: Four times a day (QID) | INTRAMUSCULAR | Status: DC | PRN

## 2018-04-24 MED ORDER — VITAMIN B-1 100 MG PO TABS
100.0000 mg | ORAL_TABLET | Freq: Every day | ORAL | Status: DC
  Administered 2018-04-27 – 2018-05-03 (×7): 100 mg via ORAL
  Filled 2018-04-24 (×9): qty 1

## 2018-04-24 NOTE — ED Triage Notes (Signed)
Pt here for eval of abdominal distention for 1 week with diarrhea. Pt also having nausea vomiting. Hx of liver cirrhosis.

## 2018-04-24 NOTE — H&P (Addendum)
History and Physical    Katie Holland VQM:[id number] DOB: [birth year] DOA: 04/24/2018  PCP: Billie Ruddy, MD Patient coming from: Home  Chief Complaint: Abdominal pain, blood in vomit and stool  HPI: Katie Holland is a 52 y.o. female with medical history significant of alcoholic liver cirrhosis, GERD presenting to the hospital for evaluation of abdominal pain and blood in vomit and stool.  Patient reports having severe right upper quadrant abdominal pain and abdominal distention for the past 2 days.  Reports having 2 episodes of nonbilious vomit with streaks of blood and 2 episodes of diarrhea mixed with blood in the past 2 days.  Also reports having chills.  States her appetite is okay.  States she was diagnosed with alcoholic liver cirrhosis during a previous hospitalization in [place] in March 2019.  Discharge document from the hospital showing that the patient was diagnosed with esophageal varices at that time.  She reports drinking alcohol for the past 15 years.  She drinks up to a sixpack of beer twice a week.  Her last drink was yesterday.  ED Course: Tachycardic (pulse in 100s to 120s).  Not hypotensive.  Hemoglobin 9.6, was 11.2 six months ago (no recent baseline in chart).  No platelet count reported due to clumping on smear. AST 80, ALT 32, alk phos 184, and T bili 8.9.  Lipase normal.  INR 2.4.  CT abdomen pelvis without contrast pending.  ED physician discussed the case with Dr. Luan Pulling from gastroenterology, recommendation is to get octreotide and IV vitamin K 10 mg.  GI will see the patient in the morning. [facility] paged to admit.   Review of Systems: As per HPI otherwise 10 point review of systems negative.  Past Medical History:  Diagnosis Date  . Alcoholic liver disease ([place])   . Chronic back pain   . Cirrhosis of liver ([place])   . GERD (gastroesophageal reflux disease)   . Jaundice     Past Surgical History:  Procedure Laterality Date  . BACK SURGERY       reports that  she has been smoking. She has never used smokeless tobacco. She reports that she drinks alcohol. She reports that she has current or past drug history.  Allergies  Allergen Reactions  . Contrast Media [Iodinated Diagnostic Agents] Itching and Swelling  . Latex Itching and Swelling    No breathing impairment, however  . Sulfa Antibiotics Itching and Swelling    No breathing impairment, however    No family history on file.  Prior to Admission medications   Medication Sig Start Date End Date Taking? Authorizing Provider  acetaminophen (TYLENOL) 325 MG tablet Take 325-650 mg by mouth every 6 (six) hours as needed (for pain or headaches).    Yes [provider]  Lactulose 20 GM/30ML SOLN Take 30 mLs (20 g total) by mouth 2 (two) times daily. Take 30 mls every eight hours. Titrate for 3 bm per 24 hrs Patient taking differently: Take 30 mLs by mouth 2 (two) times daily.  10/26/17  Yes Billie Ruddy, MD  pantoprazole (PROTONIX) 40 MG tablet Take 1 tablet (40 mg total) by mouth daily. Patient not taking: Reported on 04/24/2018 10/26/17   Billie Ruddy, MD    Physical Exam: Vitals:   04/24/18 1634 04/24/18 1934 04/24/18 2045 04/24/18 2130  BP: 121/78 119/83 107/78 109/76  Pulse: (!) 125 (!) 108 (!) 108 (!) 112  Resp: _0 Temp: 98.4 F (36.9 C)  TempSrc: Oral     SpO2: 99% 100% 99% 99%   Physical Exam  Constitutional: She is oriented to person, place, and time. She appears well-developed and well-nourished.  Appears uncomfortable due to severe abdominal distention  HENT:  Head: Normocephalic.  Mouth/Throat: Oropharynx is clear and moist.  Eyes: Scleral icterus is present.  Neck: Neck supple. JVD present. No tracheal deviation present.  Cardiovascular: Normal rate, regular rhythm and intact distal pulses.  Pulmonary/Chest: Effort normal and breath sounds normal. No respiratory distress. She has no wheezes. She has no rales.  Abdominal:  Severely  distended Generalized tenderness to palpation  Musculoskeletal: She exhibits no edema.  Neurological: She is alert and oriented to person, place, and time.  Skin: Skin is warm and dry. She is not diaphoretic.     Labs on Admission: I have personally reviewed following labs and imaging studies  CBC: Recent Labs  Lab 04/24/18 1657 04/24/18 2105  WBC 6.1 6.2  NEUTROABS 4.0  --   HGB 9.6* 9.3*  HCT 28.2* 27.1*  MCV 86.0 86.0  PLT PLATELET CLUMPS NOTED ON SMEAR, COUNT APPEARS DECREASED PENDING   Basic Metabolic Panel: Recent Labs  Lab 04/24/18 1657  NA 133*  K 3.3*  CL 99  CO2 19*  GLUCOSE 95  BUN 7  CREATININE 0.90  CALCIUM 8.4*   GFR: CrCl cannot be calculated (Unknown ideal weight.). Liver Function Tests: Recent Labs  Lab 04/24/18 1657  AST 80*  ALT 32  ALKPHOS 184*  BILITOT 8.9*  PROT 6.7  ALBUMIN 1.9*   Recent Labs  Lab 04/24/18 1657  LIPASE 24   No results for input(s): AMMONIA in the last 168 hours. Coagulation Profile: Recent Labs  Lab 04/24/18 1758  INR 2.40   Cardiac Enzymes: No results for input(s): CKTOTAL, CKMB, CKMBINDEX, TROPONINI in the last 168 hours. BNP (last 3 results) No results for input(s): PROBNP in the last 8760 hours. HbA1C: No results for input(s): HGBA1C in the last 72 hours. CBG: No results for input(s): GLUCAP in the last 168 hours. Lipid Profile: No results for input(s): CHOL, HDL, LDLCALC, TRIG, CHOLHDL, LDLDIRECT in the last 72 hours. Thyroid Function Tests: No results for input(s): TSH, T4TOTAL, FREET4, T3FREE, THYROIDAB in the last 72 hours. Anemia Panel: No results for input(s): VITAMINB12, FOLATE, FERRITIN, TIBC, IRON, RETICCTPCT in the last 72 hours. Urine analysis: No results found for: COLORURINE, APPEARANCEUR, LABSPEC, PHURINE, GLUCOSEU, HGBUR, BILIRUBINUR, KETONESUR, PROTEINUR, UROBILINOGEN, NITRITE, LEUKOCYTESUR  Radiological Exams on Admission: No results found.  EKG: Independently reviewed.  Sinus  tachycardia (heart rate 115), low voltage, nonspecific ST changes.  No prior EKG for comparison.  Assessment/Plan Principal Problem:   Hyponatremia Active Problems:   Abdominal pain   Hematemesis   Hematochezia   Alcohol abuse   Hypokalemia   Metabolic acidosis, normal anion gap (NAG)  Abdominal pain, hematemesis, hematochezia History of alcoholic liver cirrhosis and esophageal varices.  Severe abdominal distention and scleral icterus on exam.  Suspect her current presentation is due to ascites and esophageal varices. T bili significantly elevated at 8.9, suspicion for biliary tree obstruction. Tachycardic (pulse in 100s to 120s).  Not hypotensive.  Hemoglobin 9.6, was 11.2 six months ago (no recent baseline in chart).  No platelet count reported due to clumping on smear. AST 80, ALT 32, and alk phos 184.  Albumin 1.9.  Lipase normal.  INR 2.4. ED physician discussed the case with Dr. Luan Pulling from gastroenterology, recommendation is to give octreotide and IV vitamin K 10 mg.  GI will see the patient in the morning.  No signs of hepatic encephalopathy at this time. -Received octreotide bolus in the ED, continue infusion -Received IV vitamin K 10 mg in the ED -Start ceftriaxone -IV Protonix 40 mg twice daily -IV fentanyl 25 mg every 2 hours as needed for severe pain -IV Zofran prn nausea -Stat CBC to check platelet count -CBC in a.m. -Hepatic function panel in a.m. -Stat CT abdomen pelvis without contrast pending -Monitor in stepdown -CIWA monitoring; Ativan prn -Keep n.p.o. -Check FOBT -If CT confirms ascites, consult IR in the morning for paracentesis  Alcohol abuse Patient states her last drink was yesterday.  Currently tachycardic but not displaying any other signs of withdrawal. -Stepdown CIWA monitoring; Ativan prn -Thiamine, folate, multivitamin  Mild hyponatremia Sodium 133.  Likely due to underlying liver cirrhosis. -Continue to monitor; BMP in a.m.  Mild  hypokalemia Potassium 3.3 in the setting of nausea and vomiting. -Continue to replete -Check magnesium level -Continue to monitor; BMP in a.m.  Non-anion gap metabolic acidosis Bicarb 19 and anion gap 15.  Likely due to bicarbonate loss from episodes of diarrhea.  -Continue to monitor; BMP in a.m.   DVT prophylaxis: SCDs  Code Status: Patient wishes to be full code. Family Communication: Daughter at bedside updated. Disposition Plan: Anticipate discharge to home in 2 to 3 days. Consults called: Gastroenterology (Dr. Alessandra Bevels) Admission status: It is my clinical opinion that admission to INPATIENT is reasonable and necessary in this 52 y.o. female . presenting with symptoms of abdominal pain, abdominal distention, hematemesis, hematochezia, concerning for esophageal varices and ascites in the setting of alcoholic liver cirrhosis . in the context of PMH including: Alcoholic liver cirrhosis, esophageal varices, alcohol abuse . with pertinent positives on physical exam including: Scleral icterus, abdominal pain, severe abdominal distention, tachycardia . and pertinent positives on radiographic and laboratory data including: Elevated T bili, elevated LFTs . Workup and treatment include IV octreotide, IV vitamin K, IV antibiotic, IV PPI, IV pain management.  GI evaluation pending.   Given the aforementioned, the predictability of an adverse outcome is felt to be significant. I expect that the patient will require at least 2 midnights in the hospital to treat this condition.    Shela Leff MD [facility] Pager [phone]  If 7PM-7AM, please contact night-coverage [email or web address] Password [email or web address]  04/24/2018, 10:12 PM

## 2018-04-24 NOTE — ED Provider Notes (Addendum)
[facility] EMERGENCY DEPARTMENT Provider Note   CSN: [id number] Arrival date & time: 04/24/18  1630     History   Chief Complaint Chief Complaint  Patient presents with  . Abdominal Distention  . Diarrhea    HPI Katie Holland is a 52 y.o. female.  HPI   29yF with increasing abdominal pain and distention.  She is also complaining of diarrhea and nausea/vomiting.  She has had hematemesis. She reports bright red blood in her emesis and darker colored blood in her stool. She reports a hospitalization in March of this year in [place].  She was told she has alcoholic cirrhosis. She says she was told she has esophageal varices..  She reports that they "put in clips and stents."  She is unable to provide me much detail in terms of her care. The discharge summary she has with her just provides generic information and lacks specific. She has been to see a primary care provider here locally but does not sound like she has established with a local GI physician. She continues to drink daily.   Past Medical History:  Diagnosis Date  . Alcoholic liver disease ([place])   . Chronic back pain   . Cirrhosis of liver ([place])   . GERD (gastroesophageal reflux disease)   . Jaundice     Patient Active Problem List   Diagnosis Date Noted  . History of GI bleed 10/26/2017  . Alcoholic cirrhosis of liver without ascites ([place]) 10/26/2017  . Cigarette nicotine dependence without complication 69/67/8938    Past Surgical History:  Procedure Laterality Date  . BACK SURGERY       OB History   None      Home Medications    Prior to Admission medications   Medication Sig Start Date End Date Taking? Authorizing Provider  Lactulose 20 GM/30ML SOLN Take 30 mLs (20 g total) by mouth 2 (two) times daily. Take 30 mls every eight hours. Titrate for 3 bm per 24 hrs 10/26/17   Billie Ruddy, MD  pantoprazole (PROTONIX) 40 MG tablet Take 1 tablet (40 mg total) by mouth daily. 10/26/17    Billie Ruddy, MD  propranolol (INDERAL) 10 MG tablet Take 10 mg by mouth every 8 (eight) hours. Hold for SBP <90    [provider]  spironolactone (ALDACTONE) 25 MG tablet Take 25 mg by mouth daily. Take 2 oral daily    [provider]  Thiamine Mononitrate 100 MG TABS Take by mouth.    [provider]    Family History No family history on file.  Social History Social History   Tobacco Use  . Smoking status: Current Every Day Smoker  . Smokeless tobacco: Never Used  Substance Use Topics  . Alcohol use: Yes  . Drug use: Not Currently     Allergies   Contrast media [iodinated diagnostic agents]; Latex; and Sulfa antibiotics   Review of Systems Review of Systems  All systems reviewed and negative, other than as noted in HPI.  Physical Exam Updated Vital Signs BP 121/78 (BP Location: Right Arm)   Pulse (!) 125   Temp 98.4 F (36.9 C) (Oral)   Resp 18   SpO2 99%   Physical Exam  Constitutional: She appears well-developed and well-nourished. No distress.  HENT:  Head: Normocephalic and atraumatic.  Eyes: Conjunctivae are normal. Right eye exhibits no discharge. Left eye exhibits no discharge.  Neck: Neck supple.  Cardiovascular: Regular rhythm and normal heart sounds. Exam  reveals no gallop and no friction rub.  No murmur heard. Mild tachycardia  Pulmonary/Chest: Effort normal and breath sounds normal. No respiratory distress.  Abdominal: Soft. She exhibits distension. There is no tenderness.  Significant distention.  Mild diffuse abdominal tenderness without rebound or guarding.  Musculoskeletal: She exhibits no edema or tenderness.  Neurological: She is alert.  Skin: Skin is warm and dry.  Psychiatric: She has a normal mood and affect. Her behavior is normal. Thought content normal.  Nursing note and vitals reviewed.    ED Treatments / Results  Labs (all labs ordered are listed, but only abnormal results are displayed) Labs  Reviewed  COMPREHENSIVE METABOLIC PANEL - Abnormal; Notable for the following components:      Result Value   Sodium 133 (*)    Potassium 3.3 (*)    CO2 19 (*)    Calcium 8.4 (*)    Albumin 1.9 (*)    AST 80 (*)    Alkaline Phosphatase 184 (*)    Total Bilirubin 8.9 (*)    All other components within normal limits  CBC WITH DIFFERENTIAL/PLATELET - Abnormal; Notable for the following components:   RBC 3.28 (*)    Hemoglobin 9.6 (*)    HCT 28.2 (*)    RDW 22.4 (*)    All other components within normal limits  PROTIME-INR - Abnormal; Notable for the following components:   Prothrombin Time 25.8 (*)    All other components within normal limits  CBC - Abnormal; Notable for the following components:   RBC 3.15 (*)    Hemoglobin 9.3 (*)    HCT 27.1 (*)    RDW 22.5 (*)    Platelets 81 (*)    All other components within normal limits  CBC - Abnormal; Notable for the following components:   RBC 3.00 (*)    Hemoglobin 8.5 (*)    HCT 25.9 (*)    RDW 21.8 (*)    Platelets 73 (*)    All other components within normal limits  BASIC METABOLIC PANEL - Abnormal; Notable for the following components:   Sodium 134 (*)    Creatinine, Ser 1.01 (*)    Calcium 8.2 (*)    All other components within normal limits  HEPATIC FUNCTION PANEL - Abnormal; Notable for the following components:   Total Protein 6.3 (*)    Albumin 1.7 (*)    AST 66 (*)    Alkaline Phosphatase 161 (*)    Total Bilirubin 9.1 (*)    Bilirubin, Direct 4.5 (*)    Indirect Bilirubin 4.6 (*)    All other components within normal limits  MAGNESIUM - Abnormal; Notable for the following components:   Magnesium 1.6 (*)    All other components within normal limits  CBC - Abnormal; Notable for the following components:   RBC 3.11 (*)    Hemoglobin 8.9 (*)    HCT 26.8 (*)    RDW 21.9 (*)    Platelets 67 (*)    All other components within normal limits  BASIC METABOLIC PANEL - Abnormal; Notable for the following components:     CO2 20 (*)    Calcium 8.3 (*)    All other components within normal limits  POC OCCULT BLOOD, ED - Abnormal; Notable for the following components:   Fecal Occult Bld POSITIVE (*)    All other components within normal limits  MRSA PCR SCREENING  LIPASE, BLOOD  HIV ANTIBODY (ROUTINE TESTING W REFLEX)  MAGNESIUM  URINALYSIS, ROUTINE W REFLEX MICROSCOPIC  OCCULT BLOOD X 1 CARD TO LAB, STOOL  AFP TUMOR MARKER  HEPATITIS PANEL, ACUTE  HEPATITIS A ANTIBODY, TOTAL  HEPATITIS B SURFACE ANTIBODY,QUALITATIVE  CBC  COMPREHENSIVE METABOLIC PANEL  MAGNESIUM  I-STAT BETA HCG BLOOD, ED ([facility], [facility], AP ONLY)  TYPE AND SCREEN  ABO/RH    EKG EKG Interpretation  Date/Time:  Saturday April 24 2018 16:49:10 EDT Ventricular Rate:  115 PR Interval:  152 QRS Duration: 68 QT Interval:  310 QTC Calculation: 428 R Axis:   -4 Text Interpretation:  Sinus tachycardia Minimal voltage criteria for LVH, may be normal variant Cannot rule out Anterior infarct , age undetermined Non-specific ST-t changes Confirmed by Virgel Manifold [phone]) on 04/24/2018 9:46:03 PM   Radiology No results found.  Procedures Procedures (including critical care time)  CRITICAL CARE Performed by: Virgel Manifold Total critical care time: 35 minutes Critical care time was exclusive of separately billable procedures and treating other patients. Critical care was necessary to treat or prevent imminent or life-threatening deterioration. Critical care was time spent personally by me on the following activities: development of treatment plan with patient and/or surrogate as well as nursing, discussions with consultants, evaluation of patient's response to treatment, examination of patient, obtaining history from patient or surrogate, ordering and performing treatments and interventions, ordering and review of laboratory studies, ordering and review of radiographic studies, pulse oximetry and re-evaluation of patient's  condition.  Medications Ordered in ED Medications  octreotide (SANDOSTATIN) 2 mcg/mL load via infusion 50 mcg (has no administration in time range)    And  octreotide (SANDOSTATIN) 500 mcg in sodium chloride 0.9 % 250 mL (2 mcg/mL) infusion (has no administration in time range)  pantoprazole (PROTONIX) injection 40 mg (has no administration in time range)  fentaNYL (SUBLIMAZE) injection 50 mcg (has no administration in time range)  ondansetron (ZOFRAN) injection 4 mg (has no administration in time range)     Initial Impression / Assessment and Plan / ED Course  I have reviewed the triage vital signs and the nursing notes.  Pertinent labs & imaging results that were available during my care of the patient were reviewed by me and considered in my medical decision making (see chart for details).     52 year old female with hematemesis with known esophageal varices.  No hematemesis here in the emergency room.  Mild tachycardia but improving.  No hypotension.  She started on octreotide.  PPI.  INR 2.4.  Vitamin K.  Discussed with GI.  Admitted to the medical service.  Final Clinical Impressions(s) / ED Diagnoses   Final diagnoses:  Alcoholic cirrhosis of liver with ascites ([place])  Anemia, unspecified type    ED Discharge Orders    None       Virgel Manifold, MD 04/25/18 2332    Virgel Manifold, MD 05/06/18 [phone]

## 2018-04-25 ENCOUNTER — Inpatient Hospital Stay (HOSPITAL_COMMUNITY): Payer: Medicare Other | Admitting: Anesthesiology

## 2018-04-25 ENCOUNTER — Other Ambulatory Visit: Payer: Self-pay

## 2018-04-25 ENCOUNTER — Inpatient Hospital Stay (HOSPITAL_COMMUNITY): Payer: Medicare Other

## 2018-04-25 ENCOUNTER — Encounter (HOSPITAL_COMMUNITY): Payer: Self-pay | Admitting: Certified Registered Nurse Anesthetist

## 2018-04-25 ENCOUNTER — Encounter (HOSPITAL_COMMUNITY)
Admission: EM | Disposition: A | Discharge status: Home Health | Payer: Self-pay | Source: Home / Self Care | Attending: Internal Medicine

## 2018-04-25 DIAGNOSIS — E876 Hypokalemia: Secondary | ICD-10-CM

## 2018-04-25 DIAGNOSIS — I8501 Esophageal varices with bleeding: Secondary | ICD-10-CM

## 2018-04-25 HISTORY — PX: ESOPHAGEAL BANDING: SHX5518

## 2018-04-25 HISTORY — PX: ESOPHAGOGASTRODUODENOSCOPY (EGD) WITH PROPOFOL: SHX5813

## 2018-04-25 LAB — CBC
HCT: 25.9 % — ABNORMAL LOW (ref 36.0–46.0)
HEMATOCRIT: 26.8 % — AB (ref 36.0–46.0)
Hemoglobin: 8.5 g/dL — ABNORMAL LOW (ref 12.0–15.0)
Hemoglobin: 8.9 g/dL — ABNORMAL LOW (ref 12.0–15.0)
MCH: 28.3 pg (ref 26.0–34.0)
MCH: 28.6 pg (ref 26.0–34.0)
MCHC: 32.8 g/dL (ref 30.0–36.0)
MCHC: 33.2 g/dL (ref 30.0–36.0)
MCV: 86.3 fL (ref 80.0–100.0)
MCV: 86.2 fL (ref 80.0–100.0)
NRBC: 0 % (ref 0.0–0.2)
NRBC: 0 % (ref 0.0–0.2)
PLATELETS: 73 10*3/uL — AB (ref 150–400)
Platelets: 67 10*3/uL — ABNORMAL LOW (ref 150–400)
RBC: 3 MIL/uL — ABNORMAL LOW (ref 3.87–5.11)
RBC: 3.11 MIL/uL — ABNORMAL LOW (ref 3.87–5.11)
RDW: 21.8 % — ABNORMAL HIGH (ref 11.5–15.5)
RDW: 21.9 % — AB (ref 11.5–15.5)
WBC: 6.5 10*3/uL (ref 4.0–10.5)
WBC: 5.7 10*3/uL (ref 4.0–10.5)

## 2018-04-25 LAB — MAGNESIUM
MAGNESIUM: 2.3 mg/dL (ref 1.7–2.4)
Magnesium: 1.6 mg/dL — ABNORMAL LOW (ref 1.7–2.4)

## 2018-04-25 LAB — BASIC METABOLIC PANEL
ANION GAP: 7 (ref 5–15)
ANION GAP: 12 (ref 5–15)
BUN: 7 mg/dL (ref 6–20)
BUN: 7 mg/dL (ref 6–20)
CO2: 22 mmol/L (ref 22–32)
CO2: 20 mmol/L — AB (ref 22–32)
Calcium: 8.2 mg/dL — ABNORMAL LOW (ref 8.9–10.3)
Calcium: 8.3 mg/dL — ABNORMAL LOW (ref 8.9–10.3)
Chloride: 105 mmol/L (ref 98–111)
Chloride: 104 mmol/L (ref 98–111)
Creatinine, Ser: 1.01 mg/dL — ABNORMAL HIGH (ref 0.44–1.00)
Creatinine, Ser: 0.98 mg/dL (ref 0.44–1.00)
GFR calc Af Amer: >60 mL/min (ref >60)
GFR calc non Af Amer: >60 mL/min (ref >60)
GFR calc non Af Amer: >60 mL/min (ref >60)
GLUCOSE: 83 mg/dL (ref 70–99)
Glucose, Bld: 94 mg/dL (ref 70–99)
POTASSIUM: 3.8 mmol/L (ref 3.5–5.1)
Potassium: 4.1 mmol/L (ref 3.5–5.1)
SODIUM: 134 mmol/L — AB (ref 135–145)
SODIUM: 136 mmol/L (ref 135–145)

## 2018-04-25 LAB — HEPATIC FUNCTION PANEL
ALBUMIN: 1.7 g/dL — AB (ref 3.5–5.0)
ALT: 29 U/L (ref 0–44)
AST: 66 U/L — AB (ref 15–41)
Alkaline Phosphatase: 161 U/L — ABNORMAL HIGH (ref 38–126)
BILIRUBIN INDIRECT: 4.6 mg/dL — AB (ref 0.3–0.9)
Bilirubin, Direct: 4.5 mg/dL — ABNORMAL HIGH (ref 0.0–0.2)
TOTAL PROTEIN: 6.3 g/dL — AB (ref 6.5–8.1)
Total Bilirubin: 9.1 mg/dL — ABNORMAL HIGH (ref 0.3–1.2)

## 2018-04-25 LAB — POC OCCULT BLOOD, ED: Fecal Occult Bld: POSITIVE — AB

## 2018-04-25 LAB — MRSA PCR SCREENING: MRSA by PCR: NEGATIVE

## 2018-04-25 LAB — HIV ANTIBODY (ROUTINE TESTING W REFLEX): HIV SCREEN 4TH GENERATION: NONREACTIVE

## 2018-04-25 SURGERY — ESOPHAGOGASTRODUODENOSCOPY (EGD) WITH PROPOFOL
Anesthesia: Monitor Anesthesia Care

## 2018-04-25 MED ORDER — MORPHINE SULFATE (PF) 4 MG/ML IV SOLN
4.0000 mg | INTRAVENOUS | Status: DC | PRN
  Administered 2018-04-25 – 2018-04-27 (×2): 4 mg via INTRAVENOUS
  Filled 2018-04-25 (×2): qty 1

## 2018-04-25 MED ORDER — MAGNESIUM SULFATE 2 GM/50ML IV SOLN
2.0000 g | Freq: Once | INTRAVENOUS | Status: AC
  Administered 2018-04-25: 2 g via INTRAVENOUS
  Filled 2018-04-25: qty 50

## 2018-04-25 MED ORDER — SODIUM CHLORIDE 0.9 % IV SOLN
INTRAVENOUS | Status: DC
  Administered 2018-04-25: 03:00:00 via INTRAVENOUS

## 2018-04-25 MED ORDER — PROPOFOL 10 MG/ML IV BOLUS
INTRAVENOUS | Status: DC | PRN
  Administered 2018-04-25: 50 mg via INTRAVENOUS
  Administered 2018-04-25: 30 mg via INTRAVENOUS

## 2018-04-25 MED ORDER — PHENYLEPHRINE HCL 10 MG/ML IJ SOLN
INTRAMUSCULAR | Status: DC | PRN
  Administered 2018-04-25: 80 ug via INTRAVENOUS
  Administered 2018-04-25: 120 ug via INTRAVENOUS

## 2018-04-25 MED ORDER — PROPOFOL 500 MG/50ML IV EMUL
INTRAVENOUS | Status: DC | PRN
  Administered 2018-04-25: 100 ug/kg/min via INTRAVENOUS

## 2018-04-25 MED ORDER — LACTATED RINGERS IV SOLN
INTRAVENOUS | Status: DC | PRN
  Administered 2018-04-25: 11:00:00 via INTRAVENOUS

## 2018-04-25 MED ORDER — NICOTINE 7 MG/24HR TD PT24
7.0000 mg | MEDICATED_PATCH | Freq: Every day | TRANSDERMAL | Status: DC
  Administered 2018-04-25 – 2018-05-03 (×9): 7 mg via TRANSDERMAL
  Filled 2018-04-25 (×9): qty 1

## 2018-04-25 SURGICAL SUPPLY — 15 items

## 2018-04-25 NOTE — Progress Notes (Signed)
Kathrin Greathouse is pt's daughter. Her cellphone is [phone]. Work 336 [phone].  She would like a call in the morning with updates for plan/progress please. She is a CSW at [employer] and will be at the hospital tomorrow, 04/26/18.

## 2018-04-25 NOTE — ED Notes (Signed)
Patient currently at CT scan .  

## 2018-04-25 NOTE — Progress Notes (Signed)
Patient admitted to room [phone] in stable condition via stretcher accompanied by daughter and ED RN.  Patient assisted to bed, skin check completed and bath and CHG bath completed.  VSS.  Patient and daughter oriented to room, call bell and phone, both voiced understanding.  RN will continue to monitor patient and make MD aware of any changes.  P.J. Linus Mako, RN

## 2018-04-25 NOTE — Transfer of Care (Signed)
Immediate Anesthesia Transfer of Care Note  Patient: Katie Holland  Procedure(s) Performed: ESOPHAGOGASTRODUODENOSCOPY (EGD) WITH PROPOFOL (N/A ) ESOPHAGEAL BANDING  Patient Location: Endoscopy Unit  Anesthesia Type:MAC  Level of Consciousness: awake, alert , oriented and patient cooperative  Airway & Oxygen Therapy: Patient Spontanous Breathing and Patient connected to nasal cannula oxygen  Post-op Assessment: Report given to RN and Post -op Vital signs reviewed and stable  Post vital signs: Reviewed and stable  Last Vitals:  Vitals Value Taken Time  BP    Temp    Pulse    Resp    SpO2      Last Pain:  Vitals:   04/25/18 1100  TempSrc: Oral  PainSc: 0-No pain      Patients Stated Pain Goal: 5 (17/51/02 5852)  Complications: No apparent anesthesia complications

## 2018-04-25 NOTE — ED Notes (Signed)
Attempted to call report

## 2018-04-25 NOTE — Progress Notes (Signed)
PROGRESS NOTE   Noga Fogg  MEQ:[id number]    DOB: 12/21/1965    DOA: 04/24/2018  PCP: Billie Ruddy, MD   I have briefly reviewed patients previous medical records in [facility].  Brief Narrative:  52 year old female, lives alone, PMH of alcoholic cirrhosis complicated by esophageal varices, ascites, encephalopathy, coagulopathy, ongoing alcohol abuse, tobacco abuse, medication noncompliance, GERD, presented to [facility] ED 04/24/2018 due to progressive abdominal distention, pain, couple of episodes of coffee-ground emesis and dark stools.  Admitted for acute upper GI/variceal bleed, acute blood loss anemia, cirrhosis complicated by ascites and possible SBP.  [facility] [place] [facility] consulted, status post EGD and variceal banding 11/3.   Assessment & Plan:   Principal Problem:   Bleeding esophageal varices (HCC) Active Problems:   Abdominal pain   Hematemesis   Hematochezia   Alcohol abuse   Hyponatremia   Hypokalemia   Metabolic acidosis, normal anion gap (NAG)   Acute upper GI/variceal bleed: She reported history of couple of episodes of coffee-ground emesis and melena more than 48 hours prior to admission.  Hemoglobin 11.2 in May.  Initial hemoglobin 9.6 which has dropped to mid 8 g range and stable.  She was made n.p.o., placed on IV Protonix, IV octreotide infusion, received a dose of vitamin K, IV ceftriaxone.  [facility] was consulted and underwent EGD 11/3 which showed esophageal varices, 7 bands placed, no overt bleeding.  Advance diet gradually.  Acute blood loss anemia complicating chronic anemia: Secondary to GI bleed.  GI bleed seems to have clinically resolved.  Hemoglobin stable in the mid 8 g range.  Follow CBC in a.m.  Transfuse to goal hemoglobin of 7.  Decompensated alcoholic cirrhosis with ascites, suspecting SBP, jaundice, coagulopathy, thrombocytopenia and variceal bleed: Continue IV ceftriaxone.  Discontinued IV fluids due to concern for worsening ascites.  Consult IR in  a.m. for diagnostic and therapeutic ascitic tap and albumin infusion if high-volume >4 L.  Eventually will need to be on Lasix, Aldactone and nonselective beta-blockers for esophageal varices.  Absolute alcohol cessation counseled.  Not a transplant candidate at this time while still using alcohol.  No clinical hepatic encephalopathy at this time.  Strict intake output, daily weights, low-sodium diet when allowed oral intake.  Follow AFP, hepatitis panel.  As per GI, MRI of the liver while inpatient for [facility] screening given CT findings and worsening jaundice (total bilirubin 9.1).  Alcohol dependence: Patient understates the amount of alcohol she consumes.  Abstinence counseled. CIWA protocol.  Tobacco abuse: Cessation counseled.  Patient requests nicotine patch.  Abnormal CT abdomen/possible carcinomatosis: When ascitic tap done, check cytology.  Chronic hypotension: Likely related to her cirrhosis and third spacing.  Asymptomatic.  Avoid IV fluids.  Hypokalemia: Replaced.  Hypomagnesemia: Replaced.  Severe protein calorie malnutrition: Dietitian consulted.  GERD: PPI.   DVT prophylaxis: SCDs. Code Status: Full. Family Communication: Discussed in detail with patient's daughter at bedside. Disposition: DC home pending clinical improvement.   Consultants:  [facility].  Procedures:  EGD 04/25/2018:  Impression:  - Z-line regular. - Large esophageal varices with red markings. Banded x 7 with good result - Normal stomach. - Congested duodenal mucosa.  Recommendations: - Return patient to hospital ward for ongoing care. - NPO for now, clear liquids later today okay if stable - Continue present medications. - Full recommendations per consult note - GI service will continue to follow  Antimicrobials:  IV ceftriaxone 11/2 >   Subjective: Seen this morning prior to procedure.  Had  1 or 2 emesis in the ED, bilious, not coffee-ground and no blood.  No BM since admission.  Ongoing  abdominal distention and pain.  No fever or chills.  No dyspnea or chest pain.  Denies dizziness or lightheadedness.  Last consumed alcohol day prior to admission.  ROS: As above, otherwise negative.  Objective:  Vitals:   04/25/18 1146 04/25/18 1202 04/25/18 1217 04/25/18 1247  BP: (!) 89/67 (!) 85/47 92/70 103/81  Pulse: (!) 105 98 (!) 104   Resp: (!) 30 20 (!) 23 (!) 21  Temp: 97.6 F (36.4 C)     TempSrc: Oral     SpO2: 100% 95%      Examination:  General exam: Pleasant young female, small built, frail and chronically ill looking, lying comfortably propped up in bed.  Appears well-groomed. Respiratory system: Clear to auscultation. Respiratory effort normal. Cardiovascular system: S1 & S2 heard, RRR. No JVD, murmurs, rubs, gallops or clicks. No pedal edema.  Telemetry personally reviewed: Sinus rhythm. Gastrointestinal system: Abdomen is markedly distended but not tense, mild diffuse tenderness without rigidity, guarding or rebound. No organomegaly or masses felt.  Ascites + + +.  Normal bowel sounds heard. Central nervous system: Alert and oriented. No focal neurological deficits.  No asterixis. Extremities: Symmetric 5 x 5 power. Skin: No rashes, lesions or ulcers Psychiatry: Judgement and insight appear normal. Mood & affect appropriate.     Data Reviewed: I have personally reviewed following labs and imaging studies  CBC: Recent Labs  Lab 04/24/18 1657 04/24/18 2105 04/25/18 0310 04/25/18 0946  WBC 6.1 6.2 6.5 5.7  NEUTROABS 4.0  --   --   --   HGB 9.6* 9.3* 8.5* 8.9*  HCT 28.2* 27.1* 25.9* 26.8*  MCV 86.0 86.0 86.3 86.2  PLT PLATELET CLUMPS NOTED ON SMEAR, COUNT APPEARS DECREASED 81* 73* 67*   Basic Metabolic Panel: Recent Labs  Lab 04/24/18 1657 04/24/18 2343 04/25/18 0310 04/25/18 0946  NA 133*  --  134* 136  K 3.3*  --  3.8 4.1  CL 99  --  105 104  CO2 19*  --  22 20*  GLUCOSE 95  --  94 83  BUN 7  --  7 7  CREATININE 0.90  --  1.01* 0.98    CALCIUM 8.4*  --  8.2* 8.3*  MG  --  1.6*  --  2.3   Liver Function Tests: Recent Labs  Lab 04/24/18 1657 04/25/18 0310  AST 80* 66*  ALT 32 29  ALKPHOS 184* 161*  BILITOT 8.9* 9.1*  PROT 6.7 6.3*  ALBUMIN 1.9* 1.7*   Coagulation Profile: Recent Labs  Lab 04/24/18 1758  INR 2.40     Recent Results (from the past 240 hour(s))  MRSA PCR Screening     Status: None   Collection Time: 04/25/18  3:25 AM  Result Value Ref Range Status   MRSA by PCR NEGATIVE NEGATIVE Final    Comment:        The GeneXpert MRSA Assay (FDA approved for NASAL specimens only), is one component of a comprehensive MRSA colonization surveillance program. It is not intended to diagnose MRSA infection nor to guide or monitor treatment for MRSA infections. Performed at [facility], [place] [address], [place], [place] [address]          Radiology Studies: Ct Abdomen Pelvis Wo Contrast  Result Date: 04/25/2018 CLINICAL DATA:  Abdominal pain, hematemesis and diarrhea. History of cirrhosis and alcohol abuse. EXAM: CT  ABDOMEN AND PELVIS WITHOUT CONTRAST TECHNIQUE: Multidetector CT imaging of the abdomen and pelvis was performed following the standard protocol without IV contrast. Oral contrast administered. COMPARISON:  None. FINDINGS: LOWER CHEST: Lung bases are clear. The visualized heart size is normal. No pericardial effusion. Contrast distended esophagus gas with periesophageal varicosities. HEPATOBILIARY: Nodular liver. Granuloma RIGHT lobe of the liver. Faint densities layering in gallbladder equivocal for sludge or gallstones. PANCREAS: Normal. SPLEEN: Normal. ADRENALS/URINARY TRACT: Kidneys are orthotopic, demonstrating normal size and morphology. No nephrolithiasis, hydronephrosis; limited assessment for renal masses by nonenhanced CT. The unopacified ureters are normal in course and caliber. Urinary bladder is partially distended and unremarkable. Normal adrenal glands. STOMACH/BOWEL: The  stomach, small and large bowel are normal in course and caliber without inflammatory changes, poor contrast opacification of colon limiting assessment. Normal appendix. VASCULAR/LYMPHATIC: Aortoiliac vessels are normal in course and caliber. No lymphadenopathy by CT size criteria. REPRODUCTIVE: IUD in central uterus. OTHER: Large volume low-density ascites. Mesenteric edema and nodular omentum. MUSCULOSKELETAL: Non-acute.  Status post L5-S1 PLIF. IMPRESSION: 1. Cirrhosis and large volume ascites. 2. Nodular omentum, carcinomatosis possible. Recommend correlation with sampling of ascites. Electronically Signed   By: Elon Alas M.D.   On: 04/25/2018 01:52        Scheduled Meds: . folic acid  1 mg Oral Daily  . multivitamin with minerals  1 tablet Oral Daily  . pantoprazole (PROTONIX) IV  40 mg Intravenous Q12H  . thiamine  100 mg Oral Daily   Or  . thiamine  100 mg Intravenous Daily   Continuous Infusions: . cefTRIAXone (ROCEPHIN)  IV Stopped (04/24/18 2248)  . octreotide  (SANDOSTATIN)    IV infusion 50 mcg/hr (04/25/18 1108)     LOS: 1 day     Vernell Leep, MD, FACP, [facility]. [facility] Pager [phone]  If 7PM-7AM, please contact night-coverage [email or web address] Password [facility] 04/25/2018, 3:43 PM

## 2018-04-25 NOTE — Progress Notes (Signed)
Pt off floor to Endo for procedure.

## 2018-04-25 NOTE — Anesthesia Postprocedure Evaluation (Signed)
Anesthesia Post Note  Patient: Katie Holland  Procedure(s) Performed: ESOPHAGOGASTRODUODENOSCOPY (EGD) WITH PROPOFOL (N/A ) ESOPHAGEAL BANDING     Patient location during evaluation: PACU Anesthesia Type: MAC Level of consciousness: awake and alert Pain management: pain level controlled Vital Signs Assessment: post-procedure vital signs reviewed and stable Respiratory status: spontaneous breathing and respiratory function stable Cardiovascular status: stable Postop Assessment: no apparent nausea or vomiting Anesthetic complications: no    Last Vitals:  Vitals:   04/25/18 1217 04/25/18 1247  BP: 92/70 103/81  Pulse: (!) 104   Resp: (!) 23 (!) 21  Temp:    SpO2:                    Suellen Durocher DANIEL

## 2018-04-25 NOTE — Progress Notes (Signed)
RN paged Jeannette Corpus, NP to make her aware of patient's BP of 89/58, awaiting response.  Patient asymptomatic at this time.  RN will continue to monitor patient and make provider aware of any changes.  P.J. Linus Mako, RN

## 2018-04-25 NOTE — Progress Notes (Addendum)
Paged Dr Algis Liming for concern of pt's ascites shown on CT scan with NS @ 100 ml/hr. Awaiting callback.   See new orders.

## 2018-04-25 NOTE — Consult Note (Addendum)
Consultation  Referring Provider:     Vernell Leep MD Primary Care Physician:  Billie Ruddy, MD - [place] Primary Gastroenterologist:        NA Reason for Consultation:     Cirrhosis, concern for GI Bleeding         HPI:   Katie Holland is a 52 y.o. female with a reported history of alcoholic cirrhosis. She came to the hospital with abdominal pain, worsening abdominal distension, recent vomiting of blood, we were asked to see in consultation.  She was diagnosed with cirrhosis when she presented to a hospital in [place] last Spring. Reportedly she had a variceal bleed for which received banding, was given diuretics for ascites, propranolol, and lactulose for history of encephalopathy. She has not established with GI since that time. She saw Dr. Volanda Napoleon in May who referred her to [place], she never followed through with that appointment.  She states she drinks a few beers every few days. Her daughter present in the room slipped me a letter stating that her mother drinks beer and hard liquor every day and is not truthful. The daughter additionally reports she has not taken any of the medications that were prescribed to her previously, and is pleading with her to allow [place] to take care of her.   She has had progressively worsening abdominal distension over the past week. She has RUQ TTP. No fevers. Mental status has appeared appropriate. She endorse endorse vomiting dark / coffee ground material a few days ago, none since that time. She thinks her stools are mostly brown / yellow, denies melena, but had some red blood in her stool recently as well.  Hgb has downtrended since admission, down a point from 9s to 8 this AM. INR at 2.4, platelets in 70s. Renal function normal, BUN is 7. She is also newly jaundiced, prior bili of 1.4 in May, now is 9.1.   She denies any FH of liver disease.   Noncontrast CT scan of the liver shows cirrhosis with large volume ascites. She also has a nodular  appearing omentum concerning for possible carcinomatosis.   She was given PPI, octreotide, and vitamin K overnight, she is currently NPO.  Past Medical History:  Diagnosis Date  . Alcoholic liver disease ([place])   . Chronic back pain   . Cirrhosis of liver ([place])   . GERD (gastroesophageal reflux disease)   . Jaundice     Past Surgical History:  Procedure Laterality Date  . BACK SURGERY      No family history on file. Unremarkable FH per patient  Social History   Tobacco Use  . Smoking status: Current Every Day Smoker  . Smokeless tobacco: Never Used  Substance Use Topics  . Alcohol use: Yes  . Drug use: Not Currently    Prior to Admission medications   Medication Sig Start Date End Date Taking? Authorizing Provider  acetaminophen (TYLENOL) 325 MG tablet Take 325-650 mg by mouth every 6 (six) hours as needed (for pain or headaches).    Yes [provider]  Lactulose 20 GM/30ML SOLN Take 30 mLs (20 g total) by mouth 2 (two) times daily. Take 30 mls every eight hours. Titrate for 3 bm per 24 hrs Patient taking differently: Take 30 mLs by mouth 2 (two) times daily.  10/26/17  Yes Billie Ruddy, MD  pantoprazole (PROTONIX) 40 MG tablet Take 1 tablet (40 mg total) by mouth daily. Patient not taking: Reported on 04/24/2018 10/26/17  Billie Ruddy, MD    Current Facility-Administered Medications  Medication Dose Route Frequency Provider Last Rate Last Dose  . 0.9 %  sodium chloride infusion   Intravenous Continuous Modena Jansky, MD 10 mL/hr at 04/25/18 0810    . Barium Sulfate 2.1 % SUSP           . cefTRIAXone (ROCEPHIN) 1 g in sodium chloride 0.9 % 100 mL IVPB  1 g Intravenous Q24H Shela Leff, MD   Stopped at 04/24/18 2248  . fentaNYL (SUBLIMAZE) injection 25 mcg  25 mcg Intravenous Q2H PRN Shela Leff, MD   25 mcg at 04/25/18 0235  . folic acid (FOLVITE) tablet 1 mg  1 mg Oral Daily Shela Leff, MD      . LORazepam (ATIVAN) injection 2-3 mg   2-3 mg Intravenous Q1H PRN Shela Leff, MD      . multivitamin with minerals tablet 1 tablet  1 tablet Oral Daily Shela Leff, MD      . octreotide (SANDOSTATIN) 500 mcg in sodium chloride 0.9 % 250 mL (2 mcg/mL) infusion  50 mcg/hr Intravenous Continuous Shela Leff, MD 25 mL/hr at 04/25/18 0358 50 mcg/hr at 04/25/18 0358  . ondansetron (ZOFRAN) injection 4 mg  4 mg Intravenous Q6H PRN Shela Leff, MD   4 mg at 04/25/18 0235  . pantoprazole (PROTONIX) injection 40 mg  40 mg Intravenous Q12H Shela Leff, MD      . thiamine (VITAMIN B-1) tablet 100 mg  100 mg Oral Daily Shela Leff, MD       Or  . thiamine (B-1) injection 100 mg  100 mg Intravenous Daily Shela Leff, MD        Allergies as of 04/24/2018 - Review Complete 04/24/2018  Allergen Reaction Noted  . Contrast media [iodinated diagnostic agents] Itching and Swelling 10/26/2017  . Latex Itching and Swelling 10/26/2017  . Sulfa antibiotics Itching and Swelling 10/26/2017     Review of Systems:    As per HPI, otherwise negative    Physical Exam:  Vital signs in last 24 hours: Temp:  [97.5 F (36.4 C)-98.4 F (36.9 C)] 97.5 F (36.4 C) (11/03 0355) Pulse Rate:  [94-125] 94 (11/03 0800) Resp:  [16-18] 18 (11/03 0355) BP: (84-121)/(52-83) 89/58 (11/03 0355) SpO2:  [94 %-100 %] 98 % (11/03 0355)   General:   Pleasant femal in NAD Head:  Normocephalic and atraumatic. Eyes:   No icterus.   Conjunctiva icteric. Ears:  Normal auditory acuity. Neck:  Supple Lungs:  Respirations even and unlabored. Lungs clear to auscultation bilaterally.   Heart:  Regular rate and rhythm; no MRG Abdomen:  Soft, distended with ascites, RUQ TTP. [name]   Rectal:  Not performed.  Msk:  Symmetrical without gross deformities.  Extremities:  Without edema. Neurologic:  Alert and  oriented x4;  grossly normal neurologically. Skin:  Intact without significant lesions or rashes. Psych:  Alert and  cooperative. Normal affect.  LAB RESULTS: Recent Labs    04/24/18 1657 04/24/18 2105 04/25/18 0310  WBC 6.1 6.2 6.5  HGB 9.6* 9.3* 8.5*  HCT 28.2* 27.1* 25.9*  PLT PLATELET CLUMPS NOTED ON SMEAR, COUNT APPEARS DECREASED 81* 73*   BMET Recent Labs    04/24/18 1657 04/25/18 0310  NA 133* 134*  K 3.3* 3.8  CL 99 105  CO2 19* 22  GLUCOSE 95 94  BUN 7 7  CREATININE 0.90 1.01*  CALCIUM 8.4* 8.2*   LFT Recent Labs    04/25/18 0310  PROT 6.3*  ALBUMIN 1.7*  AST 66*  ALT 29  ALKPHOS 161*  BILITOT 9.1*  BILIDIR 4.5*  IBILI 4.6*   PT/INR Recent Labs    04/24/18 1758  LABPROT 25.8*  INR 2.40    STUDIES: Ct Abdomen Pelvis Wo Contrast  Result Date: 04/25/2018 CLINICAL DATA:  Abdominal pain, hematemesis and diarrhea. History of cirrhosis and alcohol abuse. EXAM: CT ABDOMEN AND PELVIS WITHOUT CONTRAST TECHNIQUE: Multidetector CT imaging of the abdomen and pelvis was performed following the standard protocol without IV contrast. Oral contrast administered. COMPARISON:  None. FINDINGS: LOWER CHEST: Lung bases are clear. The visualized heart size is normal. No pericardial effusion. Contrast distended esophagus gas with periesophageal varicosities. HEPATOBILIARY: Nodular liver. Granuloma RIGHT lobe of the liver. Faint densities layering in gallbladder equivocal for sludge or gallstones. PANCREAS: Normal. SPLEEN: Normal. ADRENALS/URINARY TRACT: Kidneys are orthotopic, demonstrating normal size and morphology. No nephrolithiasis, hydronephrosis; limited assessment for renal masses by nonenhanced CT. The unopacified ureters are normal in course and caliber. Urinary bladder is partially distended and unremarkable. Normal adrenal glands. STOMACH/BOWEL: The stomach, small and large bowel are normal in course and caliber without inflammatory changes, poor contrast opacification of colon limiting assessment. Normal appendix. VASCULAR/LYMPHATIC: Aortoiliac vessels are normal in course and  caliber. No lymphadenopathy by CT size criteria. REPRODUCTIVE: IUD in central uterus. OTHER: Large volume low-density ascites. Mesenteric edema and nodular omentum. MUSCULOSKELETAL: Non-acute.  Status post L5-S1 PLIF. IMPRESSION: 1. Cirrhosis and large volume ascites. 2. Nodular omentum, carcinomatosis possible. Recommend correlation with sampling of ascites. Electronically Signed   By: Elon Alas M.D.   On: 04/25/2018 01:52      Impression / Plan:   52 y/o female presenting with decompensated alcoholic cirrhosis, still drinking heavily per daughter - history of variceal bleeding, jaundice, ascites, and encephalopathy. She has worsening anemia, recent reports of upper GI bleeding although does not have active symptoms of bleeding currently. Her CT Scan is concerning for possible carcinomatosis. She is quite ill - MELD currently 25. Regarding her pain it's possible she has SBP, she is being empirically treated with ceftriaxone.  I had a lengthy discussion with the patient and daughter about the severity of her liver disease. She needs to completely stop drinking alcohol and will do our best to manage her decompensations. She will need to be plugged in with a Hepatologist as an outpatient but is not currently transplant eligible due to her alcohol use.   Recommend the following: - EGD for re-evaluation of varices in light of her recent bleeding and downtrending Hgb, although I do not think she is actively bleeding right now (BUN normal, last bleeding symptoms a few days ago). If anesthesia is available for EGD today will proceed with it, if not will plan for EGD tomorrow with MAC (she will not sedate well with moderate sedation in light of alcohol use.) Please keep her NPO for now in case we can do her case this AM.  - monitor Hgb, transfuse to goal Hgb of 7 - continue PPI and octreotide until EGD is done - large volume paracentesis - probably tomorrow - send for cell count, culture, cytology,  total protein, albumin. Give albumin if more than 5L removed - continue empiric Ceftriaxone - please do not give normal saline, if she needs volume give albumin. I suspect she has chronic hypotension from her cirrhosis - low Na < 2gm diet when she is cleared to take PO - once EGD / paracentesis done and hemodynamics stable, will need  diuretics - AFP, hepatitis panel, hep A / B total AB - she will warrant MRI of the liver while inpatient for [facility] screening given CT findings and worsening jaundice (CT was noncontrast) - complete alcohol abstinence which we discussed at length   Call with questions, GI service will follow.  [place] Cellar, MD [facility] Gastroenterology

## 2018-04-25 NOTE — Interval H&P Note (Signed)
History and Physical Interval Note:  04/25/2018 10:52 AM  Katie Holland  has presented today for surgery, with the diagnosis of history of varices, anemia, cirrhosis  The various methods of treatment have been discussed with the patient and family. After consideration of risks, benefits and other options for treatment, the patient has consented to  Procedure(s): ESOPHAGOGASTRODUODENOSCOPY (EGD) WITH PROPOFOL (N/A) as a surgical intervention .  The patient's history has been reviewed, patient examined, no change in status, stable for surgery.  I have reviewed the patient's chart and labs.  Questions were answered to the patient's satisfaction.     [name]

## 2018-04-25 NOTE — ED Notes (Signed)
Patient transported to CT scan . 

## 2018-04-25 NOTE — Progress Notes (Addendum)
Pt returned to room from Endo. Per report pt has 7 bands for varices. Pt c/o pain. Per report pt to continue NPO status due to banding. VS assessed. Pt systolic BP in the 11'S. Paged Dr Algis Liming to request options for pain control other than fentanyl IV. Awaiting callback.

## 2018-04-25 NOTE — Op Note (Signed)
[facility] Patient Name: Katie Holland Procedure Date : 04/25/2018 MRN: [id number] Attending MD: Carlota Raspberry. Havery Moros , MD Date of Birth: [birth year] CSN: [id number] Age: 52 Admit Type: Inpatient Procedure:                Upper GI endoscopy Indications:              decompensated cirrhosis with history of esophageal                            varices with bleeding / banding, recent                            hemetemesis, anemia Providers:                Carlota Raspberry. Havery Moros, MD, Baird Cancer, RN, Charolette Child, Technician, Tawni Carnes, CRNA Referring MD:              Medicines:                Monitored Anesthesia Care Complications:            No immediate complications. Estimated blood loss:                            Minimal. Estimated Blood Loss:     Estimated blood loss was minimal. Procedure:                Pre-Anesthesia Assessment:                           - Prior to the procedure, a History and Physical                            was performed, and patient medications and                            allergies were reviewed. The patient's tolerance of                            previous anesthesia was also reviewed. The risks                            and benefits of the procedure and the sedation                            options and risks were discussed with the patient.                            All questions were answered, and informed consent                            was obtained. Prior Anticoagulants: The patient has  taken no previous anticoagulant or antiplatelet                            agents. ASA Grade Assessment: III - A patient with                            severe systemic disease. After reviewing the risks                            and benefits, the patient was deemed in                            satisfactory condition to undergo the procedure.                           After obtaining  informed consent, the endoscope was                            passed under direct vision. Throughout the                            procedure, the patient's blood pressure, pulse, and                            oxygen saturations were monitored continuously. The                            [id number]-H190 ([id number] Olympus Adult EGD was introduced                            through the mouth, and advanced to the second part                            of duodenum. The upper GI endoscopy was                            accomplished without difficulty. The patient                            tolerated the procedure well. Scope In: Scope Out: Findings:      The Z-line appeared regular.      Multiple columns of large varices with some red markings were found in       the middle third of the esophagus and in the lower third of the       esophagus. They were very large in size, some stigmata of prior banding       noted. No active bleeding appreciated. Seven bands were successfully       placed resulting in deflation of the varices.      The exam of the esophagus was otherwise normal.      The entire examined stomach was normal. No gastric varices. No ulcers      Diffusely edematous mucosa without active bleeding and with no stigmata       of bleeding was found in the duodenal bulb and in  the second portion of       the duodenum, likely due to portal hypertension. Impression:               - Z-line regular.                           - Large esophageal varices with red markings.                            Banded x 7 with good result                           - Normal stomach.                           - Congested duodenal mucosa. Recommendation:           - Return patient to hospital ward for ongoing care.                           - NPO for now, clear liquids later today okay if                            stable                           - Continue present medications.                           -  Full recommendations per consult note                           - GI service will continue to follow Procedure Code(s):        --- Professional ---                           [phone], Esophagogastroduodenoscopy, flexible,                            transoral; with band ligation of esophageal/gastric                            varices Diagnosis Code(s):        --- Professional ---                           I85.00, Esophageal varices without bleeding                           K31.89, Other diseases of stomach and duodenum CPT copyright 2018 [employer]. All rights reserved. The codes documented in this report are preliminary and upon coder review may  be revised to meet current compliance requirements. Remo Lipps P. Armbruster, MD 04/25/2018 11:47:08 AM This report has been signed electronically. Number of Addenda: 0

## 2018-04-25 NOTE — Anesthesia Preprocedure Evaluation (Addendum)
Anesthesia Evaluation  Patient identified by MRN, date of birth, ID band Patient awake    Reviewed: Allergy & Precautions, NPO status , Patient's Chart, lab work & pertinent test results  History of Anesthesia Complications Negative for: history of anesthetic complications  Airway Mallampati: II  TM Distance: >3 FB Neck ROM: Full    Dental no notable dental hx. (+) Dental Advisory Given, Caps   Pulmonary Current Smoker,    Pulmonary exam normal        Cardiovascular negative cardio ROS Normal cardiovascular exam     Neuro/Psych negative neurological ROS  negative psych ROS   GI/Hepatic GERD  ,(+) Cirrhosis     substance abuse  alcohol use,   Endo/Other  negative endocrine ROS  Renal/GU negative Renal ROS     Musculoskeletal   Abdominal   Peds  Hematology  (+) anemia ,   Anesthesia Other Findings   Reproductive/Obstetrics                           Anesthesia Physical Anesthesia Plan  ASA: III and emergent  Anesthesia Plan: MAC   Post-op Pain Management:    Induction:   PONV Risk Score and Plan: 1 and Ondansetron  Airway Management Planned: Natural Airway and Simple Face Mask  Additional Equipment:   Intra-op Plan:   Post-operative Plan:   Informed Consent: I have reviewed the patients History and Physical, chart, labs and discussed the procedure including the risks, benefits and alternatives for the proposed anesthesia with the patient or authorized representative who has indicated his/her understanding and acceptance.   Dental advisory given  Plan Discussed with: CRNA and Anesthesiologist  Anesthesia Plan Comments: (Will start with MAC, may need GA.  If GA needed, potential for remaining intubated.  This was discussed with pt and daughter.)      Anesthesia Quick Evaluation

## 2018-04-25 NOTE — H&P (View-Only) (Signed)
   Consultation  Referring Provider:     Anand Hongalgi MD Primary Care Physician:  Banks, Shannon R, MD - [place] Primary Gastroenterologist:        NA Reason for Consultation:     Cirrhosis, concern for GI Bleeding         HPI:   Katie Holland is a 52 y.o. female with a reported history of alcoholic cirrhosis. She came to the hospital with abdominal pain, worsening abdominal distension, recent vomiting of blood, we were asked to see in consultation.  She was diagnosed with cirrhosis when she presented to a hospital in [place] last Spring. Reportedly she had a variceal bleed for which received banding, was given diuretics for ascites, propranolol, and lactulose for history of encephalopathy. She has not established with GI since that time. She saw Dr. Banks in May who referred her to us, she never followed through with that appointment.  She states she drinks a few beers every few days. Her daughter present in the room slipped me a letter stating that her mother drinks beer and hard liquor every day and is not truthful. The daughter additionally reports she has not taken any of the medications that were prescribed to her previously, and is pleading with her to allow us to take care of her.   She has had progressively worsening abdominal distension over the past week. She has RUQ TTP. No fevers. Mental status has appeared appropriate. She endorse endorse vomiting dark / coffee ground material a few days ago, none since that time. She thinks her stools are mostly brown / yellow, denies melena, but had some red blood in her stool recently as well.  Hgb has downtrended since admission, down a point from 9s to 8 this AM. INR at 2.4, platelets in 70s. Renal function normal, BUN is 7. She is also newly jaundiced, prior bili of 1.4 in May, now is 9.1.   She denies any FH of liver disease.   Noncontrast CT scan of the liver shows cirrhosis with large volume ascites. She also has a nodular  appearing omentum concerning for possible carcinomatosis.   She was given PPI, octreotide, and vitamin K overnight, she is currently NPO.  Past Medical History:  Diagnosis Date  . Alcoholic liver disease (HCC)   . Chronic back pain   . Cirrhosis of liver (HCC)   . GERD (gastroesophageal reflux disease)   . Jaundice     Past Surgical History:  Procedure Laterality Date  . BACK SURGERY      No family history on file. Unremarkable FH per patient  Social History   Tobacco Use  . Smoking status: Current Every Day Smoker  . Smokeless tobacco: Never Used  Substance Use Topics  . Alcohol use: Yes  . Drug use: Not Currently    Prior to Admission medications   Medication Sig Start Date End Date Taking? Authorizing Provider  acetaminophen (TYLENOL) 325 MG tablet Take 325-650 mg by mouth every 6 (six) hours as needed (for pain or headaches).    Yes [provider]  Lactulose 20 GM/30ML SOLN Take 30 mLs (20 g total) by mouth 2 (two) times daily. Take 30 mls every eight hours. Titrate for 3 bm per 24 hrs Patient taking differently: Take 30 mLs by mouth 2 (two) times daily.  10/26/17  Yes Banks, Shannon R, MD  pantoprazole (PROTONIX) 40 MG tablet Take 1 tablet (40 mg total) by mouth daily. Patient not taking: Reported on 04/24/2018 10/26/17     Banks, Shannon R, MD    Current Facility-Administered Medications  Medication Dose Route Frequency Provider Last Rate Last Dose  . 0.9 %  sodium chloride infusion   Intravenous Continuous Hongalgi, Anand D, MD 10 mL/hr at 04/25/18 0810    . Barium Sulfate 2.1 % SUSP           . cefTRIAXone (ROCEPHIN) 1 g in sodium chloride 0.9 % 100 mL IVPB  1 g Intravenous Q24H Rathore, Vasundhra, MD   Stopped at 04/24/18 2248  . fentaNYL (SUBLIMAZE) injection 25 mcg  25 mcg Intravenous Q2H PRN Rathore, Vasundhra, MD   25 mcg at 04/25/18 0235  . folic acid (FOLVITE) tablet 1 mg  1 mg Oral Daily Rathore, Vasundhra, MD      . LORazepam (ATIVAN) injection 2-3 mg   2-3 mg Intravenous Q1H PRN Rathore, Vasundhra, MD      . multivitamin with minerals tablet 1 tablet  1 tablet Oral Daily Rathore, Vasundhra, MD      . octreotide (SANDOSTATIN) 500 mcg in sodium chloride 0.9 % 250 mL (2 mcg/mL) infusion  50 mcg/hr Intravenous Continuous Rathore, Vasundhra, MD 25 mL/hr at 04/25/18 0358 50 mcg/hr at 04/25/18 0358  . ondansetron (ZOFRAN) injection 4 mg  4 mg Intravenous Q6H PRN Rathore, Vasundhra, MD   4 mg at 04/25/18 0235  . pantoprazole (PROTONIX) injection 40 mg  40 mg Intravenous Q12H Rathore, Vasundhra, MD      . thiamine (VITAMIN B-1) tablet 100 mg  100 mg Oral Daily Rathore, Vasundhra, MD       Or  . thiamine (B-1) injection 100 mg  100 mg Intravenous Daily Rathore, Vasundhra, MD        Allergies as of 04/24/2018 - Review Complete 04/24/2018  Allergen Reaction Noted  . Contrast media [iodinated diagnostic agents] Itching and Swelling 10/26/2017  . Latex Itching and Swelling 10/26/2017  . Sulfa antibiotics Itching and Swelling 10/26/2017     Review of Systems:    As per HPI, otherwise negative    Physical Exam:  Vital signs in last 24 hours: Temp:  [97.5 F (36.4 C)-98.4 F (36.9 C)] 97.5 F (36.4 C) (11/03 0355) Pulse Rate:  [94-125] 94 (11/03 0800) Resp:  [16-18] 18 (11/03 0355) BP: (84-121)/(52-83) 89/58 (11/03 0355) SpO2:  [94 %-100 %] 98 % (11/03 0355)   General:   Pleasant femal in NAD Head:  Normocephalic and atraumatic. Eyes:   No icterus.   Conjunctiva icteric. Ears:  Normal auditory acuity. Neck:  Supple Lungs:  Respirations even and unlabored. Lungs clear to auscultation bilaterally.   Heart:  Regular rate and rhythm; no MRG Abdomen:  Soft, distended with ascites, RUQ TTP. .   Rectal:  Not performed.  Msk:  Symmetrical without gross deformities.  Extremities:  Without edema. Neurologic:  Alert and  oriented x4;  grossly normal neurologically. Skin:  Intact without significant lesions or rashes. Psych:  Alert and  cooperative. Normal affect.  LAB RESULTS: Recent Labs    04/24/18 1657 04/24/18 2105 04/25/18 0310  WBC 6.1 6.2 6.5  HGB 9.6* 9.3* 8.5*  HCT 28.2* 27.1* 25.9*  PLT PLATELET CLUMPS NOTED ON SMEAR, COUNT APPEARS DECREASED 81* 73*   BMET Recent Labs    04/24/18 1657 04/25/18 0310  NA 133* 134*  K 3.3* 3.8  CL 99 105  CO2 19* 22  GLUCOSE 95 94  BUN 7 7  CREATININE 0.90 1.01*  CALCIUM 8.4* 8.2*   LFT Recent Labs    04/25/18 0310    PROT 6.3*  ALBUMIN 1.7*  AST 66*  ALT 29  ALKPHOS 161*  BILITOT 9.1*  BILIDIR 4.5*  IBILI 4.6*   PT/INR Recent Labs    04/24/18 1758  LABPROT 25.8*  INR 2.40    STUDIES: Ct Abdomen Pelvis Wo Contrast  Result Date: 04/25/2018 CLINICAL DATA:  Abdominal pain, hematemesis and diarrhea. History of cirrhosis and alcohol abuse. EXAM: CT ABDOMEN AND PELVIS WITHOUT CONTRAST TECHNIQUE: Multidetector CT imaging of the abdomen and pelvis was performed following the standard protocol without IV contrast. Oral contrast administered. COMPARISON:  None. FINDINGS: LOWER CHEST: Lung bases are clear. The visualized heart size is normal. No pericardial effusion. Contrast distended esophagus gas with periesophageal varicosities. HEPATOBILIARY: Nodular liver. Granuloma RIGHT lobe of the liver. Faint densities layering in gallbladder equivocal for sludge or gallstones. PANCREAS: Normal. SPLEEN: Normal. ADRENALS/URINARY TRACT: Kidneys are orthotopic, demonstrating normal size and morphology. No nephrolithiasis, hydronephrosis; limited assessment for renal masses by nonenhanced CT. The unopacified ureters are normal in course and caliber. Urinary bladder is partially distended and unremarkable. Normal adrenal glands. STOMACH/BOWEL: The stomach, small and large bowel are normal in course and caliber without inflammatory changes, poor contrast opacification of colon limiting assessment. Normal appendix. VASCULAR/LYMPHATIC: Aortoiliac vessels are normal in course and  caliber. No lymphadenopathy by CT size criteria. REPRODUCTIVE: IUD in central uterus. OTHER: Large volume low-density ascites. Mesenteric edema and nodular omentum. MUSCULOSKELETAL: Non-acute.  Status post L5-S1 PLIF. IMPRESSION: 1. Cirrhosis and large volume ascites. 2. Nodular omentum, carcinomatosis possible. Recommend correlation with sampling of ascites. Electronically Signed   By: Courtnay  Bloomer M.D.   On: 04/25/2018 01:52      Impression / Plan:   52 y/o female presenting with decompensated alcoholic cirrhosis, still drinking heavily per daughter - history of variceal bleeding, jaundice, ascites, and encephalopathy. She has worsening anemia, recent reports of upper GI bleeding although does not have active symptoms of bleeding currently. Her CT Scan is concerning for possible carcinomatosis. She is quite ill - MELD currently 25. Regarding her pain it's possible she has SBP, she is being empirically treated with ceftriaxone.  I had a lengthy discussion with the patient and daughter about the severity of her liver disease. She needs to completely stop drinking alcohol and will do our best to manage her decompensations. She will need to be plugged in with a Hepatologist as an outpatient but is not currently transplant eligible due to her alcohol use.   Recommend the following: - EGD for re-evaluation of varices in light of her recent bleeding and downtrending Hgb, although I do not think she is actively bleeding right now (BUN normal, last bleeding symptoms a few days ago). If anesthesia is available for EGD today will proceed with it, if not will plan for EGD tomorrow with MAC (she will not sedate well with moderate sedation in light of alcohol use.) Please keep her NPO for now in case we can do her case this AM.  - monitor Hgb, transfuse to goal Hgb of 7 - continue PPI and octreotide until EGD is done - large volume paracentesis - probably tomorrow - send for cell count, culture, cytology,  total protein, albumin. Give albumin if more than 5L removed - continue empiric Ceftriaxone - please do not give normal saline, if she needs volume give albumin. I suspect she has chronic hypotension from her cirrhosis - low Na < 2gm diet when she is cleared to take PO - once EGD / paracentesis done and hemodynamics stable, will need   diuretics - AFP, hepatitis panel, hep A / B total AB - she will warrant MRI of the liver while inpatient for HCC screening given CT findings and worsening jaundice (CT was noncontrast) - complete alcohol abstinence which we discussed at length   Call with questions, GI service will follow.  Ariyana Faw, MD [facility]   

## 2018-04-26 ENCOUNTER — Inpatient Hospital Stay (HOSPITAL_COMMUNITY): Payer: Medicare Other

## 2018-04-26 ENCOUNTER — Encounter (HOSPITAL_COMMUNITY): Payer: Self-pay | Admitting: Gastroenterology

## 2018-04-26 DIAGNOSIS — K7031 Alcoholic cirrhosis of liver with ascites: Principal | ICD-10-CM

## 2018-04-26 DIAGNOSIS — E43 Unspecified severe protein-calorie malnutrition: Secondary | ICD-10-CM

## 2018-04-26 HISTORY — PX: IR PARACENTESIS: IMG2679

## 2018-04-26 LAB — COMPREHENSIVE METABOLIC PANEL
ALT: 28 U/L (ref 0–44)
ANION GAP: 6 (ref 5–15)
AST: 70 U/L — ABNORMAL HIGH (ref 15–41)
Albumin: 1.7 g/dL — ABNORMAL LOW (ref 3.5–5.0)
Alkaline Phosphatase: 147 U/L — ABNORMAL HIGH (ref 38–126)
BUN: 14 mg/dL (ref 6–20)
CALCIUM: 8.1 mg/dL — AB (ref 8.9–10.3)
CO2: 21 mmol/L — ABNORMAL LOW (ref 22–32)
Chloride: 108 mmol/L (ref 98–111)
Creatinine, Ser: 1.1 mg/dL — ABNORMAL HIGH (ref 0.44–1.00)
GFR calc non Af Amer: 56 mL/min — ABNORMAL LOW (ref >60)
Glucose, Bld: 93 mg/dL (ref 70–99)
POTASSIUM: 4.3 mmol/L (ref 3.5–5.1)
SODIUM: 135 mmol/L (ref 135–145)
TOTAL PROTEIN: 6.1 g/dL — AB (ref 6.5–8.1)
Total Bilirubin: 9.2 mg/dL — ABNORMAL HIGH (ref 0.3–1.2)

## 2018-04-26 LAB — LACTATE DEHYDROGENASE, PLEURAL OR PERITONEAL FLUID: LD FL: 33 U/L — AB (ref 3–23)

## 2018-04-26 LAB — CBC
HCT: 25.4 % — ABNORMAL LOW (ref 36.0–46.0)
Hemoglobin: 8.3 g/dL — ABNORMAL LOW (ref 12.0–15.0)
MCH: 28.3 pg (ref 26.0–34.0)
MCHC: 32.7 g/dL (ref 30.0–36.0)
MCV: 86.7 fL (ref 80.0–100.0)
PLATELETS: 83 10*3/uL — AB (ref 150–400)
RBC: 2.93 MIL/uL — AB (ref 3.87–5.11)
RDW: 21.4 % — AB (ref 11.5–15.5)
WBC: 7.2 10*3/uL (ref 4.0–10.5)
nRBC: 0 % (ref 0.0–0.2)

## 2018-04-26 LAB — PROTEIN, PLEURAL OR PERITONEAL FLUID: Total protein, fluid: <3 g/dL

## 2018-04-26 LAB — BODY FLUID CELL COUNT WITH DIFFERENTIAL
Lymphs, Fluid: 21 %
MONOCYTE-MACROPHAGE-SEROUS FLUID: 54 % (ref 50–90)
Neutrophil Count, Fluid: 25 % (ref 0–25)
Total Nucleated Cell Count, Fluid: 15 cu mm (ref 0–1000)

## 2018-04-26 LAB — GLUCOSE, PLEURAL OR PERITONEAL FLUID: Glucose, Fluid: 101 mg/dL

## 2018-04-26 LAB — MAGNESIUM: Magnesium: 2.2 mg/dL (ref 1.7–2.4)

## 2018-04-26 LAB — GRAM STAIN

## 2018-04-26 LAB — AFP TUMOR MARKER: AFP, Serum, Tumor Marker: 1.4 ng/mL (ref 0.0–8.3)

## 2018-04-26 LAB — ABO/RH: ABO/RH(D): O POS

## 2018-04-26 LAB — ALBUMIN, PLEURAL OR PERITONEAL FLUID

## 2018-04-26 MED ORDER — BOOST / RESOURCE BREEZE PO LIQD CUSTOM
1.0000 | Freq: Three times a day (TID) | ORAL | Status: DC
  Administered 2018-04-27 – 2018-05-03 (×16): 1 via ORAL

## 2018-04-26 MED ORDER — LIDOCAINE HCL 1 % IJ SOLN
INTRAMUSCULAR | Status: AC | PRN
  Administered 2018-04-26: 10 mL

## 2018-04-26 MED ORDER — LIDOCAINE HCL 1 % IJ SOLN
INTRAMUSCULAR | Status: AC
  Filled 2018-04-26: qty 20

## 2018-04-26 NOTE — Procedures (Signed)
PROCEDURE SUMMARY:  Successful US guided paracentesis from right lateral abdomen.  Yielded 4 of clear yellow fluid.  No immediate complications.  Patient tolerated well.   Specimen was sent for labs.  WENDY S BLAIR PA-C 04/26/2018 2:00 PM

## 2018-04-26 NOTE — Progress Notes (Signed)
PROGRESS NOTE   Katie Holland  IWP:[id number]    DOB: 01/25/1966    DOA: 04/24/2018  PCP: Billie Ruddy, MD   I have briefly reviewed patients previous medical records in [facility].  Brief Narrative:  52 year old female, lives alone, PMH of alcoholic cirrhosis complicated by esophageal varices, ascites, encephalopathy, coagulopathy, ongoing alcohol abuse, tobacco abuse, medication noncompliance, GERD, presented to [facility] ED 04/24/2018 due to progressive abdominal distention, pain, couple of episodes of coffee-ground emesis and dark stools.  Admitted for acute upper GI/variceal bleed, acute blood loss anemia, cirrhosis complicated by ascites and possible SBP.  [facility] consulted, status post EGD and variceal banding 11/3.  Consulted IR for ultrasound-guided diagnostic and therapeutic paracentesis on 11/4.   Assessment & Plan:   Principal Problem:   Bleeding esophageal varices (HCC) Active Problems:   Abdominal pain   Hematemesis   Hematochezia   Alcohol abuse   Hyponatremia   Hypokalemia   Metabolic acidosis, normal anion gap (NAG)   Protein-calorie malnutrition, severe   Acute upper GI/variceal bleed: She reported history of couple of episodes of coffee-ground emesis and melena more than 48 hours prior to admission.  Hemoglobin 11.2 in May.  Initial hemoglobin 9.6 which has dropped to mid 8 g range and stable.  She was made n.p.o., placed on IV Protonix, IV octreotide infusion, received a dose of vitamin K, IV ceftriaxone.  [facility] was consulted and underwent EGD 11/3 which showed esophageal varices, 7 bands placed, no overt bleeding.  Currently on clear liquids and advance diet gradually.  As per GI follow-up, continue IV octreotide until 11/5 night to complete 48 hours.  She will need repeat variceal banding as outpatient in 4-5 weeks.  Acute blood loss anemia complicating chronic anemia: Secondary to GI bleed.  GI bleed seems to have clinically resolved. Follow CBC in a.m.   Transfuse to goal hemoglobin of 7.  Hemoglobin stable in the 8 g range.  Decompensated alcoholic cirrhosis with ascites, suspecting SBP, jaundice, coagulopathy, thrombocytopenia and variceal bleed: Continue IV ceftriaxone.  Discontinued IV fluids due to concern for worsening ascites.  Consult IR in a.m. for diagnostic and therapeutic ascitic tap and albumin infusion if high-volume >4 L.  Eventually will need to be on Lasix, Aldactone and nonselective beta-blockers for esophageal varices.  Absolute alcohol cessation counseled.  Not a transplant candidate at this time while still using alcohol.  No clinical hepatic encephalopathy at this time.  Strict intake output, daily weights, low-sodium diet when allowed oral intake.  Follow AFP, hepatitis panel.  As per GI, MRI of the liver while inpatient for [facility] screening given CT findings and worsening jaundice (total bilirubin 9.1).  IR consulted on 11/4 for ultrasound-guided diagnostic and therapeutic paracentesis.  Alcohol dependence: Patient understates the amount of alcohol she consumes.  Abstinence counseled. CIWA protocol.  Patient somnolent this morning from Ativan she had received earlier.  Clinical social work consult when patient is out of withdrawal and consistently awake and alert to participate in outpatient resources to assist with alcohol dependence issues.  Tobacco abuse: Cessation counseled.  Patient requests nicotine patch.  Abnormal CT abdomen/possible carcinomatosis: When ascitic tap done, check cytology.  Chronic hypotension: Likely related to her cirrhosis and third spacing.  Asymptomatic.  Avoid IV fluids.  Hypokalemia: Replaced.  Hypomagnesemia: Replaced.  Severe protein calorie malnutrition: Dietitian input appreciated.  GERD: PPI.   DVT prophylaxis: SCDs. Code Status: Full. Family Communication: Daughter who works as a Chartered loss adjuster work at [employer],  updated care and answered questions. Disposition: DC home  pending clinical improvement.   Consultants:   GI.  Procedures:  EGD 04/25/2018:  Impression:  - Z-line regular. - Large esophageal varices with red markings. Banded x 7 with good result - Normal stomach. - Congested duodenal mucosa.  Recommendations: - Return patient to hospital ward for ongoing care. - NPO for now, clear liquids later today okay if stable - Continue present medications. - Full recommendations per consult note - GI service will continue to follow  Antimicrobials:  IV ceftriaxone 11/2 >   Subjective: Patient seen this morning.  Drowsy but arousable.  No pain reported.  She received Ativan at 8:14 AM for CIWA of 12.  As per RN, no vomiting or melena.  No other acute issues reported.  ROS: As above, otherwise unable.  Objective:  Vitals:   04/25/18 2110 04/26/18 0052 04/26/18 0404 04/26/18 1031  BP:  95/62 98/66   Pulse:  (!) 112 (!) 108   Resp:  14 18   Temp: 97.6 F (36.4 C) 97.6 F (36.4 C) 97.7 F (36.5 C)   TempSrc: Oral Oral Oral   SpO2:  94% 98%   Weight:    70 kg  Height:    5\' 4"  (1.626 m)    Examination:  General exam: Pleasant young female, small built, frail and chronically ill looking, lying comfortably propped up in bed.  Does not appear in any distress. Respiratory system: Clear to auscultation. Respiratory effort normal.  Stable. Cardiovascular system: S1 & S2 heard, RRR. No JVD, murmurs, rubs, gallops or clicks. No pedal edema.  Telemetry personally reviewed: SR-mild ST in the 110s. Gastrointestinal system: Abdomen is markedly distended but not tense, nontender today. No organomegaly or masses felt.  Ascites + + +.  Normal bowel sounds heard. Central nervous system: Mental status as indicated above. No focal neurological deficits.  No asterixis 11/3. Extremities: Symmetric 5 x 5 power. Skin: No rashes, lesions or ulcers Psychiatry: Judgement and insight appear normal. Mood & affect sleeping.     Data Reviewed: I have  personally reviewed following labs and imaging studies  CBC: Recent Labs  Lab 04/24/18 1657 04/24/18 2105 04/25/18 0310 04/25/18 0946 04/26/18 0451  WBC 6.1 6.2 6.5 5.7 7.2  NEUTROABS 4.0  --   --   --   --   HGB 9.6* 9.3* 8.5* 8.9* 8.3*  HCT 28.2* 27.1* 25.9* 26.8* 25.4*  MCV 86.0 86.0 86.3 86.2 86.7  PLT PLATELET CLUMPS NOTED ON SMEAR, COUNT APPEARS DECREASED 81* 73* 67* 83*   Basic Metabolic Panel: Recent Labs  Lab 04/24/18 1657 04/24/18 2343 04/25/18 0310 04/25/18 0946 04/26/18 0451  NA 133*  --  134* 136 135  K 3.3*  --  3.8 4.1 4.3  CL 99  --  105 104 108  CO2 19*  --  22 20* 21*  GLUCOSE 95  --  94 83 93  BUN 7  --  7 7 14   CREATININE 0.90  --  1.01* 0.98 1.10*  CALCIUM 8.4*  --  8.2* 8.3* 8.1*  MG  --  1.6*  --  2.3 2.2   Liver Function Tests: Recent Labs  Lab 04/24/18 1657 04/25/18 0310 04/26/18 0451  AST 80* 66* 70*  ALT 32 29 28  ALKPHOS 184* 161* 147*  BILITOT 8.9* 9.1* 9.2*  PROT 6.7 6.3* 6.1*  ALBUMIN 1.9* 1.7* 1.7*   Coagulation Profile: Recent Labs  Lab 04/24/18 1758  INR 2.40  Recent Results (from the past 240 hour(s))  MRSA PCR Screening     Status: None   Collection Time: 04/25/18  3:25 AM  Result Value Ref Range Status   MRSA by PCR NEGATIVE NEGATIVE Final    Comment:        The GeneXpert MRSA Assay (FDA approved for NASAL specimens only), is one component of a comprehensive MRSA colonization surveillance program. It is not intended to diagnose MRSA infection nor to guide or monitor treatment for MRSA infections. Performed at [facility], [place] [address], [place], [place] [address]          Radiology Studies: Ct Abdomen Pelvis Wo Contrast  Result Date: 04/25/2018 CLINICAL DATA:  Abdominal pain, hematemesis and diarrhea. History of cirrhosis and alcohol abuse. EXAM: CT ABDOMEN AND PELVIS WITHOUT CONTRAST TECHNIQUE: Multidetector CT imaging of the abdomen and pelvis was performed following the standard  protocol without IV contrast. Oral contrast administered. COMPARISON:  None. FINDINGS: LOWER CHEST: Lung bases are clear. The visualized heart size is normal. No pericardial effusion. Contrast distended esophagus gas with periesophageal varicosities. HEPATOBILIARY: Nodular liver. Granuloma RIGHT lobe of the liver. Faint densities layering in gallbladder equivocal for sludge or gallstones. PANCREAS: Normal. SPLEEN: Normal. ADRENALS/URINARY TRACT: Kidneys are orthotopic, demonstrating normal size and morphology. No nephrolithiasis, hydronephrosis; limited assessment for renal masses by nonenhanced CT. The unopacified ureters are normal in course and caliber. Urinary bladder is partially distended and unremarkable. Normal adrenal glands. STOMACH/BOWEL: The stomach, small and large bowel are normal in course and caliber without inflammatory changes, poor contrast opacification of colon limiting assessment. Normal appendix. VASCULAR/LYMPHATIC: Aortoiliac vessels are normal in course and caliber. No lymphadenopathy by CT size criteria. REPRODUCTIVE: IUD in central uterus. OTHER: Large volume low-density ascites. Mesenteric edema and nodular omentum. MUSCULOSKELETAL: Non-acute.  Status post L5-S1 PLIF. IMPRESSION: 1. Cirrhosis and large volume ascites. 2. Nodular omentum, carcinomatosis possible. Recommend correlation with sampling of ascites. Electronically Signed   By: Elon Alas M.D.   On: 04/25/2018 01:52        Scheduled Meds: . feeding supplement  1 Container Oral TID BM  . folic acid  1 mg Oral Daily  . multivitamin with minerals  1 tablet Oral Daily  . nicotine  7 mg Transdermal Daily  . pantoprazole (PROTONIX) IV  40 mg Intravenous Q12H  . thiamine  100 mg Oral Daily   Or  . thiamine  100 mg Intravenous Daily   Continuous Infusions: . cefTRIAXone (ROCEPHIN)  IV 1 g (04/25/18 2204)  . octreotide  (SANDOSTATIN)    IV infusion 50 mcg/hr (04/26/18 0405)     LOS: 2 days     Vernell Leep, MD, FACP, [facility]. [facility] Pager [phone]  If 7PM-7AM, please contact night-coverage [email or web address] Password [email or web address] 04/26/2018, 11:47 AM

## 2018-04-26 NOTE — Progress Notes (Signed)
Initial Nutrition Assessment  DOCUMENTATION CODES:   Severe malnutrition in context of chronic illness  INTERVENTION:   Boost Breeze po TID, each supplement provides 250 kcal and 9 grams of protein   Encourage PO intake while awake   NUTRITION DIAGNOSIS:   Severe Malnutrition related to chronic illness(cirrhosis ) as evidenced by severe fat depletion, severe muscle depletion.  GOAL:   Patient will meet greater than or equal to 90% of their needs  MONITOR:   PO intake, Supplement acceptance  REASON FOR ASSESSMENT:   Consult Assessment of nutrition requirement/status  ASSESSMENT:   Pt who lives alone with PMH of alcoholic cirrhosis, esophageal varices, ascites, encephalopathy, coagulopathy, ongoing alcohol abuse, tobacco abuse, medication noncompliance, GERD is now admitted due ot progressive abd distention, pain, and bleeding esophageal varices s/p banding (7 bands placed) on 11/3.    Pt will awake to voice but unable to stay awake.  Breakfast (clear liquids) untouched on bedside table. Per nurse tech, pt is too sleepy to eat. Pt on CIWA protocol.  Daughter is a CSW at [employer].   Medications reviewed and include: folic acid, MVI, thiamine, IV octreotide  Labs reviewed: hgb 8.3 (L) Weight is falsely elevated due to +ascites.   NUTRITION - FOCUSED PHYSICAL EXAM:    Most Recent Value  Orbital Region  No depletion  Upper Arm Region  Severe depletion  Thoracic and Lumbar Region  Unable to assess  Buccal Region  Severe depletion  Temple Region  Severe depletion  Clavicle Bone Region  Severe depletion  Clavicle and Acromion Bone Region  Severe depletion  Scapular Bone Region  Unable to assess  Dorsal Hand  Severe depletion  Patellar Region  Mild depletion  Anterior Thigh Region  Mild depletion  Posterior Calf Region  Mild depletion  Edema (RD Assessment)  None [+large ascites]  Hair  Reviewed  Eyes  Reviewed  Mouth  Unable to assess  Skin  Reviewed  Nails  Reviewed        Diet Order:   Diet Order            Diet clear liquid Room service appropriate? Yes; Fluid consistency: Thin  Diet effective now              EDUCATION NEEDS:   Not appropriate for education at this time  Skin:  Skin Assessment: Reviewed RN Assessment  Last BM:  11/2  Height:   Ht Readings from Last 1 Encounters:  04/26/18 5\' 4"  (1.626 m)    Weight:   Wt Readings from Last 1 Encounters:  04/26/18 70 kg    Ideal Body Weight:  54.5 kg  BMI:  Body mass index is 26.49 kg/m.  Estimated Nutritional Needs:   Kcal:  1800-2000  Protein:  85-100 grams  Fluid:  >1.5 L/day  Maylon Peppers RD, LDN, CNSC [phone] Pager [phone] After Hours Pager

## 2018-04-26 NOTE — Progress Notes (Signed)
[facility] Progress Note    Since last GI note: EGD yesterday found, banded large esophageal varices  This morning she is drowsy, arousable but falls asleep quickly  Objective: Vital signs in last 24 hours: Temp:  [97.5 F (36.4 C)-98.1 F (36.7 C)] 97.7 F (36.5 C) (11/04 0404) Pulse Rate:  [91-112] 108 (11/04 0404) Resp:  [13-30] 18 (11/04 0404) BP: (85-108)/(47-81) 98/66 (11/04 0404) SpO2:  [94 %-100 %] 98 % (11/04 0404) Last BM Date: 04/24/18 General: alert and oriented times 1 or 2 (very sleepy) Heart: regular rate and rythm Abdomen: soft, non-tender, +distended, normal bowel sounds No lower ext edema  Lab Results: Recent Labs    04/25/18 0310 04/25/18 0946 04/26/18 0451  WBC 6.5 5.7 7.2  HGB 8.5* 8.9* 8.3*  PLT 73* 67* 83*  MCV 86.3 86.2 86.7   Recent Labs    04/25/18 0310 04/25/18 0946 04/26/18 0451  NA 134* 136 135  K 3.8 4.1 4.3  CL 105 104 108  CO2 22 20* 21*  GLUCOSE 94 83 93  BUN 7 7 14   CREATININE 1.01* 0.98 1.10*  CALCIUM 8.2* 8.3* 8.1*   Recent Labs    04/24/18 1657 04/25/18 0310 04/26/18 0451  PROT 6.7 6.3* 6.1*  ALBUMIN 1.9* 1.7* 1.7*  AST 80* 66* 70*  ALT 32 29 28  ALKPHOS 184* 161* 147*  BILITOT 8.9* 9.1* 9.2*  BILIDIR  --  4.5*  --   IBILI  --  4.6*  --    Recent Labs    04/24/18 1758  INR 2.40    Studies/Results: Ct Abdomen Pelvis Wo Contrast  Result Date: 04/25/2018 CLINICAL DATA:  Abdominal pain, hematemesis and diarrhea. History of cirrhosis and alcohol abuse. EXAM: CT ABDOMEN AND PELVIS WITHOUT CONTRAST TECHNIQUE: Multidetector CT imaging of the abdomen and pelvis was performed following the standard protocol without IV contrast. Oral contrast administered. COMPARISON:  None. FINDINGS: LOWER CHEST: Lung bases are clear. The visualized heart size is normal. No pericardial effusion. Contrast distended esophagus gas with periesophageal varicosities. HEPATOBILIARY: Nodular liver. Granuloma RIGHT lobe of the  liver. Faint densities layering in gallbladder equivocal for sludge or gallstones. PANCREAS: Normal. SPLEEN: Normal. ADRENALS/URINARY TRACT: Kidneys are orthotopic, demonstrating normal size and morphology. No nephrolithiasis, hydronephrosis; limited assessment for renal masses by nonenhanced CT. The unopacified ureters are normal in course and caliber. Urinary bladder is partially distended and unremarkable. Normal adrenal glands. STOMACH/BOWEL: The stomach, small and large bowel are normal in course and caliber without inflammatory changes, poor contrast opacification of colon limiting assessment. Normal appendix. VASCULAR/LYMPHATIC: Aortoiliac vessels are normal in course and caliber. No lymphadenopathy by CT size criteria. REPRODUCTIVE: IUD in central uterus. OTHER: Large volume low-density ascites. Mesenteric edema and nodular omentum. MUSCULOSKELETAL: Non-acute.  Status post L5-S1 PLIF. IMPRESSION: 1. Cirrhosis and large volume ascites. 2. Nodular omentum, carcinomatosis possible. Recommend correlation with sampling of ascites. Electronically Signed   By: Elon Alas M.D.   On: 04/25/2018 01:52     Medications: Scheduled Meds: . folic acid  1 mg Oral Daily  . multivitamin with minerals  1 tablet Oral Daily  . nicotine  7 mg Transdermal Daily  . pantoprazole (PROTONIX) IV  40 mg Intravenous Q12H  . thiamine  100 mg Oral Daily   Or  . thiamine  100 mg Intravenous Daily   Continuous Infusions: . cefTRIAXone (ROCEPHIN)  IV 1 g (04/25/18 2204)  . octreotide  (SANDOSTATIN)    IV infusion 50 mcg/hr (04/26/18 0405)   PRN  Meds:.LORazepam, morphine injection, ondansetron (ZOFRAN) IV    Assessment/Plan: 52 y.o. female with etoh related cirrhosis, decompensated  Recently bled from esophageal varices likely. She does not appear to be still bleeding.  Should continue IV octreotide until tomorrow night (would be 48 hours).  Continue IV PPI. She will need repeat varices banding as an outpatient  in 4-5 weeks.  Workup for other potential causes of her liver disease continues (viral panels ordered, will also order ANA level).  Most important is that she never resume drinking alcohol.  She is in withdrawal from etoh now, CIWA protocol in place seems to be working well.  Continue Abx.  She is getting a paracentesis today for usual testing (cytology, albumin, cell count, differential, protein).  Not sure if the nodular appearing omentum is neoplastic or related to ascites, low albumin soft tissue changes.  Will follow along.  Milus Banister, MD  04/26/2018, 9:01 AM  Gastroenterology Pager [phone]

## 2018-04-27 ENCOUNTER — Encounter (HOSPITAL_COMMUNITY)
Admission: EM | Disposition: A | Discharge status: Home Health | Payer: Self-pay | Source: Home / Self Care | Attending: Internal Medicine

## 2018-04-27 ENCOUNTER — Inpatient Hospital Stay (HOSPITAL_COMMUNITY): Payer: Medicare Other | Admitting: Certified Registered Nurse Anesthetist

## 2018-04-27 ENCOUNTER — Encounter (HOSPITAL_COMMUNITY): Payer: Self-pay | Admitting: *Deleted

## 2018-04-27 DIAGNOSIS — F1010 Alcohol abuse, uncomplicated: Secondary | ICD-10-CM

## 2018-04-27 DIAGNOSIS — I8511 Secondary esophageal varices with bleeding: Secondary | ICD-10-CM

## 2018-04-27 HISTORY — PX: ESOPHAGEAL BANDING: SHX5518

## 2018-04-27 HISTORY — PX: ESOPHAGOGASTRODUODENOSCOPY: SHX5428

## 2018-04-27 LAB — TYPE AND SCREEN
ABO/RH(D): O POS
Antibody Screen: POSITIVE
DAT, IgG: NEGATIVE
Donor AG Type: NEGATIVE
PT AG Type: NEGATIVE
Unit division: 0

## 2018-04-27 LAB — CBC
HEMATOCRIT: 22.3 % — AB (ref 36.0–46.0)
Hemoglobin: 7.4 g/dL — ABNORMAL LOW (ref 12.0–15.0)
MCH: 28.9 pg (ref 26.0–34.0)
MCHC: 33.2 g/dL (ref 30.0–36.0)
MCV: 87.1 fL (ref 80.0–100.0)
NRBC: 0.6 % — AB (ref 0.0–0.2)
Platelets: 75 10*3/uL — ABNORMAL LOW (ref 150–400)
RBC: 2.56 MIL/uL — AB (ref 3.87–5.11)
RDW: 21.6 % — AB (ref 11.5–15.5)
WBC: 5.2 10*3/uL (ref 4.0–10.5)

## 2018-04-27 LAB — BASIC METABOLIC PANEL
Anion gap: 6 (ref 5–15)
BUN: 16 mg/dL (ref 6–20)
CHLORIDE: 107 mmol/L (ref 98–111)
CO2: 21 mmol/L — ABNORMAL LOW (ref 22–32)
Calcium: 7.9 mg/dL — ABNORMAL LOW (ref 8.9–10.3)
Creatinine, Ser: 0.98 mg/dL (ref 0.44–1.00)
Glucose, Bld: 83 mg/dL (ref 70–99)
Potassium: 4 mmol/L (ref 3.5–5.1)
SODIUM: 134 mmol/L — AB (ref 135–145)

## 2018-04-27 LAB — HEPATITIS PANEL, ACUTE
HEP B S AG: NEGATIVE
Hep A IgM: NEGATIVE
Hep B C IgM: NEGATIVE

## 2018-04-27 LAB — BPAM RBC
Blood Product Expiration Date: 201912012359
Unit Type and Rh: 5100

## 2018-04-27 LAB — PROTIME-INR
INR: 2.11
Prothrombin Time: 23.4 seconds — ABNORMAL HIGH (ref 11.4–15.2)

## 2018-04-27 LAB — PREPARE RBC (CROSSMATCH)

## 2018-04-27 LAB — HEPATITIS B SURFACE ANTIBODY,QUALITATIVE: Hep B S Ab: REACTIVE

## 2018-04-27 LAB — HEPATITIS A ANTIBODY, TOTAL: Hep A Total Ab: NEGATIVE

## 2018-04-27 SURGERY — EGD (ESOPHAGOGASTRODUODENOSCOPY)
Anesthesia: Monitor Anesthesia Care

## 2018-04-27 MED ORDER — SODIUM CHLORIDE 0.9% IV SOLUTION
Freq: Once | INTRAVENOUS | Status: AC
  Administered 2018-04-27: 18:00:00 via INTRAVENOUS

## 2018-04-27 MED ORDER — SODIUM CHLORIDE 0.9 % IV SOLN
INTRAVENOUS | Status: DC | PRN
  Administered 2018-04-27: 16:00:00 via INTRAVENOUS

## 2018-04-27 MED ORDER — SODIUM CHLORIDE 0.9 % IV SOLN
INTRAVENOUS | Status: DC | PRN
  Administered 2018-04-27: 40 ug/min via INTRAVENOUS

## 2018-04-27 MED ORDER — FUROSEMIDE 40 MG PO TABS
40.0000 mg | ORAL_TABLET | Freq: Every day | ORAL | Status: DC
  Administered 2018-04-27 – 2018-04-29 (×3): 40 mg via ORAL
  Filled 2018-04-27 (×3): qty 1

## 2018-04-27 MED ORDER — PROPOFOL 10 MG/ML IV BOLUS
INTRAVENOUS | Status: DC | PRN
  Administered 2018-04-27: 40 mg via INTRAVENOUS
  Administered 2018-04-27: 20 mg via INTRAVENOUS

## 2018-04-27 MED ORDER — LACTULOSE 10 GM/15ML PO SOLN
10.0000 g | Freq: Two times a day (BID) | ORAL | Status: DC
  Administered 2018-04-27 (×2): 10 g via ORAL
  Filled 2018-04-27 (×2): qty 15

## 2018-04-27 MED ORDER — PROPOFOL 500 MG/50ML IV EMUL
INTRAVENOUS | Status: DC | PRN
  Administered 2018-04-27: 150 ug/kg/min via INTRAVENOUS

## 2018-04-27 MED ORDER — ALBUMIN HUMAN 5 % IV SOLN
INTRAVENOUS | Status: DC | PRN
  Administered 2018-04-27: 17:00:00 via INTRAVENOUS

## 2018-04-27 MED ORDER — SPIRONOLACTONE 25 MG PO TABS
100.0000 mg | ORAL_TABLET | Freq: Every day | ORAL | Status: DC
  Administered 2018-04-27 – 2018-04-29 (×3): 100 mg via ORAL
  Filled 2018-04-27 (×4): qty 4

## 2018-04-27 MED ORDER — MORPHINE SULFATE (PF) 2 MG/ML IV SOLN: 1.0000 mg | INTRAVENOUS | Status: DC | PRN

## 2018-04-27 MED ORDER — SODIUM CHLORIDE 0.9 % IV SOLN: INTRAVENOUS | Status: DC | PRN

## 2018-04-27 MED ORDER — SODIUM CHLORIDE 0.9% IV SOLUTION: Freq: Once | INTRAVENOUS | Status: DC

## 2018-04-27 MED ORDER — VITAMIN K1 10 MG/ML IJ SOLN
10.0000 mg | Freq: Every day | INTRAMUSCULAR | Status: AC
  Administered 2018-04-27 – 2018-04-29 (×3): 10 mg via SUBCUTANEOUS
  Filled 2018-04-27 (×6): qty 1

## 2018-04-27 NOTE — Care Management Important Message (Signed)
Important Message  Patient Details  Name: Katie Holland MRN: [id number] Date of Birth: [birth year]   Medicare Important Message Given:  Yes    Dexton Zwilling Montine Circle 04/27/2018, 3:43 PM

## 2018-04-27 NOTE — Anesthesia Preprocedure Evaluation (Signed)
Anesthesia Evaluation  Patient identified by MRN, date of birth, ID band Patient awake    Reviewed: Allergy & Precautions, NPO status , Patient's Chart, lab work & pertinent test results  History of Anesthesia Complications Negative for: history of anesthetic complications  Airway Mallampati: II  TM Distance: >3 FB Neck ROM: Full    Dental no notable dental hx. (+) Dental Advisory Given, Caps   Pulmonary Current Smoker,    Pulmonary exam normal        Cardiovascular negative cardio ROS Normal cardiovascular exam     Neuro/Psych negative neurological ROS  negative psych ROS   GI/Hepatic GERD  ,(+) Cirrhosis   Esophageal Varices  substance abuse  alcohol use,   Endo/Other  negative endocrine ROS  Renal/GU negative Renal ROS     Musculoskeletal   Abdominal   Peds  Hematology  (+) anemia ,   Anesthesia Other Findings   Reproductive/Obstetrics                             Anesthesia Physical  Anesthesia Plan  ASA: III and emergent  Anesthesia Plan: MAC   Post-op Pain Management:    Induction:   PONV Risk Score and Plan: 1 and Ondansetron  Airway Management Planned: Natural Airway and Simple Face Mask  Additional Equipment:   Intra-op Plan:   Post-operative Plan:   Informed Consent: I have reviewed the patients History and Physical, chart, labs and discussed the procedure including the risks, benefits and alternatives for the proposed anesthesia with the patient or authorized representative who has indicated his/her understanding and acceptance.   Dental advisory given  Plan Discussed with: CRNA and Anesthesiologist  Anesthesia Plan Comments: (Will start with MAC, may need GA.  If GA needed, potential for remaining intubated.  This was discussed with pt and daughter.)        Anesthesia Quick Evaluation

## 2018-04-27 NOTE — Progress Notes (Signed)
Pt found to be desatting by charge nurse in 52s. Pt stated that she wears 5L of O2 at home. Ppt placed on 2L [place] and O2 sats at 96%. Will continue to monitor.

## 2018-04-27 NOTE — Transfer of Care (Signed)
Immediate Anesthesia Transfer of Care Note  Patient: Katie Holland  Procedure(s) Performed: ESOPHAGOGASTRODUODENOSCOPY (EGD) (N/A ) ESOPHAGEAL BANDING  Patient Location: Endoscopy Unit  Anesthesia Type:MAC  Level of Consciousness: sedated  Airway & Oxygen Therapy: Patient Spontanous Breathing and Patient connected to nasal cannula oxygen  Post-op Assessment: Report given to RN and Post -op Vital signs reviewed and stable  Post vital signs: Reviewed and stable  Last Vitals:  Vitals Value Taken Time  BP    Temp    Pulse    Resp    SpO2      Last Pain:  Vitals:   04/27/18 1559  TempSrc: Oral  PainSc: 0-No pain      Patients Stated Pain Goal: 5 (29/56/21 3086)  Complications: No apparent anesthesia complications. Albumin started per MDA- phenylephrine gtt 40 mcg/min.

## 2018-04-27 NOTE — Anesthesia Postprocedure Evaluation (Signed)
Anesthesia Post Note  Patient: Katie Holland  Procedure(s) Performed: ESOPHAGOGASTRODUODENOSCOPY (EGD) (N/A ) ESOPHAGEAL BANDING     Patient location during evaluation: PACU Anesthesia Type: MAC Level of consciousness: awake and alert Pain management: pain level controlled Vital Signs Assessment: post-procedure vital signs reviewed and stable Respiratory status: spontaneous breathing, nonlabored ventilation, respiratory function stable and patient connected to nasal cannula oxygen Cardiovascular status: blood pressure returned to baseline and stable Postop Assessment: no apparent nausea or vomiting Anesthetic complications: no    Last Vitals:  Vitals:   04/27/18 1700 04/27/18 1705  BP: 111/75 127/75  Pulse: 82 84  Resp: (!) 27 (!) 28  Temp:    SpO2: 100% 100%    Last Pain:  Vitals:   04/27/18 1559  TempSrc: Oral  PainSc: 0-No pain                 Montez Hageman

## 2018-04-27 NOTE — Op Note (Signed)
[facility] Patient Name: Katie Holland Procedure Date : 04/27/2018 MRN: [id number] Attending MD: Milus Banister , MD Date of Birth: 05/16/1966 CSN: [id number] Age: 52 Admit Type: Inpatient Procedure:                Upper GI endoscopy Indications:              Variceal band ligation 2 days ago, suspected                            recurrent bleeding this afternoon (hematochezia) Providers:                Milus Banister, MD, Carlyn Reichert, RN, Elspeth Cho Tech., Technician, Trixie Deis, CRNA Referring MD:              Medicines:                Monitored Anesthesia Care Complications:            No immediate complications. Estimated blood loss:                            None. Estimated Blood Loss:     Estimated blood loss: none. Procedure:                Pre-Anesthesia Assessment:                           - Prior to the procedure, a History and Physical                            was performed, and patient medications and                            allergies were reviewed. The patient's tolerance of                            previous anesthesia was also reviewed. The risks                            and benefits of the procedure and the sedation                            options and risks were discussed with the patient.                            All questions were answered, and informed consent                            was obtained. Prior Anticoagulants: The patient has                            taken no previous anticoagulant or antiplatelet  agents. ASA Grade Assessment: IV - A patient with                            severe systemic disease that is a constant threat                            to life. After reviewing the risks and benefits,                            the patient was deemed in satisfactory condition to                            undergo the procedure.                           After  obtaining informed consent, the endoscope was                            passed under direct vision. Throughout the                            procedure, the patient's blood pressure, pulse, and                            oxygen saturations were monitored continuously. The                            [id number]-H190 ([id number]) Olympus Adult EGD was introduced                            through the mouth, and advanced to the second part                            of duodenum. The upper GI endoscopy was                            accomplished without difficulty. The patient                            tolerated the procedure well. Scope In: Scope Out: Findings:      No recent or old blood in the UGI tract.      There was about 3-400cc of clear liquid in the stomach.      Portal gastropathy changed, mild.      The recently placed variceal ligating bands were still in place along       the esophagus. There were still several areas of large varices in the       distal and mid esophagus and I placed 4 more ligating bands today.      The exam was otherwise without abnormality. Impression:               - No recent or old blood in the UGI tract.                           -  There was about 3-400cc of clear liquid in the                            stomach.                           - Portal gastropathy changed, mild                           - The recently placed variceal ligating bands were                            still in place along the esophagus. There were                            still several areas of large varices in the distal                            and mid esophagus and I placed 4 more ligating                            bands today.                           Possibly the red blood per rectum this afternoon                            was old blood finally evacuating. Also may have                            been from another source, lower in the GI tract. Recommendation:           -  Return patient to hospital ward for ongoing care.                           - Continue IV octreotide, IV ppi for now.                           - Transfuse blood products as needed.                           - She may need a colonoscopy in the near future.                            Given her withdrawal issues, she will probably not                            be able to prep for a while however. Procedure Code(s):        --- Professional ---                           [phone], Esophagogastroduodenoscopy, flexible,  transoral; with band ligation of esophageal/gastric                            varices Diagnosis Code(s):        --- Professional ---                           I85.00, Esophageal varices without bleeding                           K92.1, Melena (includes Hematochezia) CPT copyright 2018 American Medical Association. All rights reserved. The codes documented in this report are preliminary and upon coder review may  be revised to meet current compliance requirements. Milus Banister, MD 04/27/2018 4:40:08 PM This report has been signed electronically. Number of Addenda: 0

## 2018-04-27 NOTE — Progress Notes (Signed)
Removed 4mg  of ativan out of the pyxis, but only needed 2mg  for q4 CIWA score. Returned unopened vial. To pyxis, and documented 0.0 waste per pharmacy instructions.

## 2018-04-27 NOTE — Progress Notes (Signed)
Patient had one dark red loose BM. Dr. Algis Liming and Dr. Ardis Hughs from GI aware. Will continue to monitor.

## 2018-04-27 NOTE — Anesthesia Procedure Notes (Signed)
Procedure Name: MAC Date/Time: 04/27/2018 4:15 PM Performed by: Leonor Liv, CRNA Pre-anesthesia Checklist: Patient identified, Emergency Drugs available, Suction available, Patient being monitored and Timeout performed Oxygen Delivery Method: Nasal cannula Airway Equipment and Method: Bite block Placement Confirmation: positive ETCO2 Dental Injury: Teeth and Oropharynx as per pre-operative assessment

## 2018-04-27 NOTE — H&P (View-Only) (Signed)
[facility] Progress Note   Chief Complaint:   Cirrhosis , upper GI bleed   SUBJECTIVE:    says she vomiting what was dark and looked like "stool" this am   ASSESSMENT AND PLAN:   1. 52 yo female with decompensated ETOH cirrhosis / UGI bleed from varices, s/p EGD with banding x7 on 11/3.  -reportedly vomited stool colored material this am and hgb did drift some overnight from 8.3 >> 7.4.  -Low thresh hold repeat EGD if vomits dark emesis again (assuming she did, patient confused) -keep on clear for now until N/V resolved.  -continue Octreotide gtt.  -continue BID IV PPI. -continue Rocephin - INR 2.11. If evidence for rebleeding than consider Vit K or FFP  -she is s/p 4 liter LVP, still pretty distended but not tense. o SBP.  Renal function okay, will start low dose diuretics.  -cytology pending.  -Workup to exclude other etiologies of liver disease are pending Hep A, B negative. ANA pending.  -Groggy state likely multifactorial ( ETOH withdrawal, Benzos and HE as she has some asterixis). Will start low dose Lactulose and further sort out after ETOH w/d   2. ABL anemia. -will transfuse one unit of blood.  -CBC this evening.    OBJECTIVE:     Vital signs in last 24 hours: Temp:  [97.4 F (36.3 C)-97.9 F (36.6 C)] 97.4 F (36.3 C) (11/05 0800) Pulse Rate:  [99-107] 99 (11/05 0800) Resp:  [18-24] 23 (11/05 0800) BP: (88-114)/(56-79) 101/71 (11/05 0800) SpO2:  [74 %-100 %] 99 % (11/05 0800) Weight:  [68.7 kg-70 kg] 68.7 kg (11/05 0623) Last BM Date: 04/24/18 General:   Groggy thin female in NAD Heart:  Regular rate and rhythm.  No lower extremity edema   Pulm: Normal respiratory effort Abdomen:  Soft, distended, nontender.  Normal bowel sounds     Neurologic:  Groggy. Oriented to year, + asterixis Psych:  cooperative.    Intake/Output from previous day: 11/04 0701 - 11/05 0700 In: 756.3 [I.V.:546.3; IV Piggyback:210] Out: -  Intake/Output this  shift: No intake/output data recorded.  Lab Results: Recent Labs    04/25/18 0946 04/26/18 0451 04/27/18 0316  WBC 5.7 7.2 5.2  HGB 8.9* 8.3* 7.4*  HCT 26.8* 25.4* 22.3*  PLT 67* 83* 75*   BMET Recent Labs    04/25/18 0946 04/26/18 0451 04/27/18 0316  NA 136 135 134*  K 4.1 4.3 4.0  CL 104 108 107  CO2 20* 21* 21*  GLUCOSE 83 93 83  BUN 7 14 16   CREATININE 0.98 1.10* 0.98  CALCIUM 8.3* 8.1* 7.9*   LFT Recent Labs    04/25/18 0310 04/26/18 0451  PROT 6.3* 6.1*  ALBUMIN 1.7* 1.7*  AST 66* 70*  ALT 29 28  ALKPHOS 161* 147*  BILITOT 9.1* 9.2*  BILIDIR 4.5*  --   IBILI 4.6*  --    PT/INR Recent Labs    04/24/18 1758 04/27/18 0316  LABPROT 25.8* 23.4*  INR 2.40 2.11   Hepatitis Panel Recent Labs    04/25/18 0946  HEPBSAG Negative  HCVAB <0.1  HEPAIGM Negative  HEPBIGM Negative    Ir Paracentesis  Result Date: 04/26/2018 INDICATION: Ascites likely secondary to alcohol abuse with cirrhosis. Also nodular omentum seen on CT scan. Request for diagnostic and therapeutic paracentesis up to 4 L. EXAM: ULTRASOUND GUIDED PARACENTESIS MEDICATIONS: 1% lidocaine 10 mL COMPLICATIONS: None immediate. PROCEDURE: Informed written consent was obtained from the patient after a discussion  of the risks, benefits and alternatives to treatment. A timeout was performed prior to the initiation of the procedure. Initial ultrasound scanning demonstrates a large amount of ascites within the right lower abdominal quadrant. The right lower abdomen was prepped and draped in the usual sterile fashion. 1% lidocaine with epinephrine was used for local anesthesia. Following this, a 19 gauge, 7-cm, Yueh catheter was introduced. An ultrasound image was saved for documentation purposes. The paracentesis was performed. The catheter was removed and a dressing was applied. The patient tolerated the procedure well without immediate post procedural complication. FINDINGS: A total of approximately 4 L  of clear yellow fluid was removed. Samples were sent to the laboratory as requested by the clinical team. IMPRESSION: Successful ultrasound-guided paracentesis yielding 4 liters of peritoneal fluid. Read by: Gareth Eagle, PA-C Electronically Signed   By: Aletta Edouard M.D.   On: 04/26/2018 14:00     Principal Problem:   Bleeding esophageal varices (HCC) Active Problems:   Abdominal pain   Hematemesis   Hematochezia   Alcohol abuse   Hyponatremia   Hypokalemia   Metabolic acidosis, normal anion gap (NAG)   Protein-calorie malnutrition, severe     LOS: 3 days   Tye Savoy ,NP 04/27/2018, 8:53 AM

## 2018-04-27 NOTE — Progress Notes (Signed)
PROGRESS NOTE   Katie Holland  CBJ:[id number]    DOB: Oct 09, 1965    DOA: 04/24/2018  PCP: Billie Ruddy, MD   I have briefly reviewed patients previous medical [facility] in [facility].  Brief Narrative:  52 year old female, lives alone, PMH of alcoholic cirrhosis complicated by esophageal varices, ascites, encephalopathy, coagulopathy, ongoing alcohol abuse, tobacco abuse, medication noncompliance, GERD, presented to [facility] ED 04/24/2018 due to progressive abdominal distention, pain, couple of episodes of coffee-ground emesis and dark stools.  Admitted for acute upper GI/variceal bleed, acute blood loss anemia, cirrhosis complicated by ascites and possible SBP.  [facility] consulted, status post EGD and variceal banding 11/3.  Consulted IR for ultrasound-guided diagnostic and therapeutic paracentesis on 11/4.   Assessment & Plan:   Principal Problem:   Bleeding esophageal varices (HCC) Active Problems:   Abdominal pain   Hematemesis   Hematochezia   Alcohol abuse   Hyponatremia   Hypokalemia   Metabolic acidosis, normal anion gap (NAG)   Protein-calorie malnutrition, severe   Acute upper GI/variceal bleed: She reported history of couple of episodes of coffee-ground emesis and melena more than 48 hours prior to admission.  Hemoglobin 11.2 in May.  Initial hemoglobin 9.6 which has dropped to mid 8 g range and stable.  She was made n.p.o., placed on IV Protonix, IV octreotide infusion, received a dose of vitamin K, IV ceftriaxone.  [facility] was consulted and underwent EGD 11/3 which showed esophageal varices, 7 bands placed, no overt bleeding.  On 11/5, reportedly vomited stool colored material in the morning and then had a large dark red loose BM as per RN report, hemoglobin dropped from 8.3 yesterday to 7.4 this morning.  Concern for recurrent variceal bleed.  Discussed with GI and plan to repeat EGD this afternoon.  Continue IV Protonix, octreotide infusion and Rocephin.  Also getting  daily vitamin K x3 days to try and reverse INR.  Remains on clears..  She will need repeat variceal banding as outpatient in 4-5 weeks.  Acute blood loss anemia complicating chronic anemia: Secondary to GI bleed.  Concern for recurrent GI bleeding on 11/5, management as above.  Hemoglobin dropped from 8.3-7.4.  Transfusing 1 unit PRBC.  Follow posttransfusion CBC and transfuse to goal 7 g per DL.  Avoid over transfusion due to varices.  Decompensated alcoholic cirrhosis with ascites, suspecting SBP, jaundice, coagulopathy, thrombocytopenia and variceal bleed: Continue IV ceftriaxone.  Discontinued IV fluids due to concern for worsening ascites.  Absolute alcohol cessation counseled.  Not a transplant candidate at this time while still using alcohol. Strict intake output, daily weights, low-sodium diet when allowed oral intake.  Follow AFP, hepatitis panel.  As per GI, MRI of the liver while inpatient for [facility] screening given CT findings and worsening jaundice (total bilirubin 9.1).  Status post ultrasound-guided 4 L paracentesis by IR on 11/4.  Fluid not consistent with SBP.  However continue ceftriaxone due to GI bleed and ascites.  GI starting low-dose diuretics.  Concern for encephalopathy, starting lactulose.  Check ammonia in a.m.   Alcohol dependence: Patient understates the amount of alcohol she consumes.  Abstinence counseled. CIWA protocol.  Patient somnolent this morning from Ativan she had received earlier or hepatic encephalopathy.  Clinical social work consult when patient is out of withdrawal and consistently awake and alert to participate in outpatient resources to assist with alcohol dependence issues.CIWA 3-7   Tobacco abuse: Cessation counseled.  Patient requests nicotine patch.  Abnormal CT abdomen/possible carcinomatosis: Ascitic fluid  cytology 04/26/2018: No malignant cells identified.  Chronic hypotension: Likely related to her cirrhosis and third spacing.  Asymptomatic.  Avoid IV  fluids.  Hypokalemia: Replaced.  Hypomagnesemia: Replaced.  Severe protein calorie malnutrition: Dietitian input appreciated.  GERD: PPI.   DVT prophylaxis: SCDs. Code Status: Full. Family Communication: Daughter who works as a CSW at [employer], updated care and answered questions. Disposition: DC home pending clinical improvement.  Not medically stable for discharge.   Consultants:  [facility].  Procedures:  EGD 04/25/2018:  Impression:  - Z-line regular. - Large esophageal varices with red markings. Banded x 7 with good result - Normal stomach. - Congested duodenal mucosa.  Recommendations: - Return patient to hospital ward for ongoing care. - NPO for now, clear liquids later today okay if stable - Continue present medications. - Full recommendations per consult note - GI service will continue to follow  Antimicrobials:  IV ceftriaxone 11/2 >   Subjective: Patient seen this morning.  Drowsy but arousable.  Oriented to self.  Indicates abdominal pain is better.  Denies pain elsewhere.  As per report, vomited stool colored material in the morning.  Subsequently RN reported large dark red loose BM.  ROS: As above, otherwise unable.  Objective:  Vitals:   04/27/18 0557 04/27/18 0623 04/27/18 0800 04/27/18 1313  BP:   101/71 106/80  Pulse:   99 96  Resp:   (!) 23 20  Temp:   (!) 97.4 F (36.3 C) (!) 97.4 F (36.3 C)  TempSrc:   Oral Oral  SpO2: (!) 74%  99% 98%  Weight:  68.7 kg    Height:        Examination:  General exam: Pleasant young female, small built, frail and chronically ill looking, lying comfortably propped up in bed.  She did not appear in any distress. Respiratory system: Clear to auscultation.  No increased work of breathing. Cardiovascular system: S1 and S2 heard, RRR.  No JVD, murmurs or pedal edema.  Telemetry personally reviewed: SR-ST in the 110s. Gastrointestinal system: Abdomen is less distended, not tense, nontender.  No  organomegaly or masses appreciated.  Ascites + +.  Normal bowel sounds heard. Central nervous system: Mental status as indicated above. No focal neurological deficits.  Patient not cooperating with me to check asterixis. Extremities: Moves all limbs symmetrically. Skin: No rashes, lesions or ulcers Psychiatry: Judgement and insight impaired currently. Mood & affect unable to assess at this time.     Data Reviewed: I have personally reviewed following labs and imaging studies  CBC: Recent Labs  Lab 04/24/18 1657 04/24/18 2105 04/25/18 0310 04/25/18 0946 04/26/18 0451 04/27/18 0316  WBC 6.1 6.2 6.5 5.7 7.2 5.2  NEUTROABS 4.0  --   --   --   --   --   HGB 9.6* 9.3* 8.5* 8.9* 8.3* 7.4*  HCT 28.2* 27.1* 25.9* 26.8* 25.4* 22.3*  MCV 86.0 86.0 86.3 86.2 86.7 87.1  PLT PLATELET CLUMPS NOTED ON SMEAR, COUNT APPEARS DECREASED 81* 73* 67* 83* 75*   Basic Metabolic Panel: Recent Labs  Lab 04/24/18 1657 04/24/18 2343 04/25/18 0310 04/25/18 0946 04/26/18 0451 04/27/18 0316  NA 133*  --  134* 136 135 134*  K 3.3*  --  3.8 4.1 4.3 4.0  CL 99  --  105 104 108 107  CO2 19*  --  22 20* 21* 21*  GLUCOSE 95  --  94 83 93 83  BUN 7  --  7 7 14  16  CREATININE 0.90  --  1.01* 0.98 1.10* 0.98  CALCIUM 8.4*  --  8.2* 8.3* 8.1* 7.9*  MG  --  1.6*  --  2.3 2.2  --    Liver Function Tests: Recent Labs  Lab 04/24/18 1657 04/25/18 0310 04/26/18 0451  AST 80* 66* 70*  ALT 32 29 28  ALKPHOS 184* 161* 147*  BILITOT 8.9* 9.1* 9.2*  PROT 6.7 6.3* 6.1*  ALBUMIN 1.9* 1.7* 1.7*   Coagulation Profile: Recent Labs  Lab 04/24/18 1758 04/27/18 0316  INR 2.40 2.11     Recent Results (from the past 240 hour(s))  MRSA PCR Screening     Status: None   Collection Time: 04/25/18  3:25 AM  Result Value Ref Range Status   MRSA by PCR NEGATIVE NEGATIVE Final    Comment:        The GeneXpert MRSA Assay (FDA approved for NASAL specimens only), is one component of a comprehensive MRSA  colonization surveillance program. It is not intended to diagnose MRSA infection nor to guide or monitor treatment for MRSA infections. Performed at [facility], [place] [address] [place], [place] [address]   Gram stain     Status: None   Collection Time: 04/26/18  1:34 PM  Result Value Ref Range Status   Specimen Description FLUID  Final   Special Requests NONE  Final   Gram Stain   Final    WBC PRESENT, PREDOMINANTLY MONONUCLEAR NO ORGANISMS SEEN CYTOSPIN SMEAR Performed at [facility], [place] [address], [place], [place] [address]    Report Status 04/26/2018 FINAL  Final         Radiology Studies: Ir Paracentesis  Result Date: 04/26/2018 INDICATION: Ascites likely secondary to alcohol abuse with cirrhosis. Also nodular omentum seen on CT scan. Request for diagnostic and therapeutic paracentesis up to 4 L. EXAM: ULTRASOUND GUIDED PARACENTESIS MEDICATIONS: 1% lidocaine 10 mL COMPLICATIONS: None immediate. PROCEDURE: Informed written consent was obtained from the patient after a discussion of the risks, benefits and alternatives to treatment. A timeout was performed prior to the initiation of the procedure. Initial ultrasound scanning demonstrates a large amount of ascites within the right lower abdominal quadrant. The right lower abdomen was prepped and draped in the usual sterile fashion. 1% lidocaine with epinephrine was used for local anesthesia. Following this, a 19 gauge, 7-cm, Yueh catheter was introduced. An ultrasound image was saved for documentation purposes. The paracentesis was performed. The catheter was removed and a dressing was applied. The patient tolerated the procedure well without immediate post procedural complication. FINDINGS: A total of approximately 4 L of clear yellow fluid was removed. Samples were sent to the laboratory as requested by the clinical team. IMPRESSION: Successful ultrasound-guided paracentesis yielding 4 liters of peritoneal fluid. Read  by: Gareth Eagle, PA-C Electronically Signed   By: Aletta Edouard M.D.   On: 04/26/2018 14:00        Scheduled Meds: . sodium chloride   Intravenous Once  . feeding supplement  1 Container Oral TID BM  . folic acid  1 mg Oral Daily  . furosemide  40 mg Oral Daily  . lactulose  10 g Oral BID  . multivitamin with minerals  1 tablet Oral Daily  . nicotine  7 mg Transdermal Daily  . pantoprazole (PROTONIX) IV  40 mg Intravenous Q12H  . phytonadione  10 mg Subcutaneous Daily  . spironolactone  100 mg Oral Daily  . thiamine  100 mg Oral Daily  Or  . thiamine  100 mg Intravenous Daily   Continuous Infusions: . cefTRIAXone (ROCEPHIN)  IV 1 g (04/27/18 0600)  . octreotide  (SANDOSTATIN)    IV infusion 50 mcg/hr (04/27/18 1046)     LOS: 3 days     Vernell Leep, MD, FACP, [facility]. [facility] Pager [phone]  If 7PM-7AM, please contact night-coverage [email or web address] Password [email or web address] 04/27/2018, 2:49 PM

## 2018-04-27 NOTE — Progress Notes (Signed)
[facility] Progress Note   Chief Complaint:   Cirrhosis , upper GI bleed   SUBJECTIVE:    says she vomiting what was dark and looked like "stool" this am   ASSESSMENT AND PLAN:   1. 52 yo female with decompensated ETOH cirrhosis / UGI bleed from varices, s/p EGD with banding x7 on 11/3.  -reportedly vomited stool colored material this am and hgb did drift some overnight from 8.3 >> 7.4.  -Low thresh hold repeat EGD if vomits dark emesis again (assuming she did, patient confused) -keep on clear for now until N/V resolved.  -continue Octreotide gtt.  -continue BID IV PPI. -continue Rocephin - INR 2.11. If evidence for rebleeding than consider Vit K or FFP  -she is s/p 4 liter LVP, still pretty distended but not tense. o SBP.  Renal function okay, will start low dose diuretics.  -cytology pending.  -Workup to exclude other etiologies of liver disease are pending Hep A, B negative. ANA pending.  -Groggy state likely multifactorial ( ETOH withdrawal, Benzos and HE as she has some asterixis). Will start low dose Lactulose and further sort out after ETOH w/d   2. ABL anemia. -will transfuse one unit of blood.  -CBC this evening.    OBJECTIVE:     Vital signs in last 24 hours: Temp:  [97.4 F (36.3 C)-97.9 F (36.6 C)] 97.4 F (36.3 C) (11/05 0800) Pulse Rate:  [99-107] 99 (11/05 0800) Resp:  [18-24] 23 (11/05 0800) BP: (88-114)/(56-79) 101/71 (11/05 0800) SpO2:  [74 %-100 %] 99 % (11/05 0800) Weight:  [68.7 kg-70 kg] 68.7 kg (11/05 0623) Last BM Date: 04/24/18 General:   Groggy thin female in NAD Heart:  Regular rate and rhythm.  No lower extremity edema   Pulm: Normal respiratory effort Abdomen:  Soft, distended, nontender.  Normal bowel sounds     Neurologic:  Groggy. Oriented to year, + asterixis Psych:  cooperative.    Intake/Output from previous day: 11/04 0701 - 11/05 0700 In: 756.3 [I.V.:546.3; IV Piggyback:210] Out: -  Intake/Output this  shift: No intake/output data recorded.  Lab Results: Recent Labs    04/25/18 0946 04/26/18 0451 04/27/18 0316  WBC 5.7 7.2 5.2  HGB 8.9* 8.3* 7.4*  HCT 26.8* 25.4* 22.3*  PLT 67* 83* 75*   BMET Recent Labs    04/25/18 0946 04/26/18 0451 04/27/18 0316  NA 136 135 134*  K 4.1 4.3 4.0  CL 104 108 107  CO2 20* 21* 21*  GLUCOSE 83 93 83  BUN 7 14 16   CREATININE 0.98 1.10* 0.98  CALCIUM 8.3* 8.1* 7.9*   LFT Recent Labs    04/25/18 0310 04/26/18 0451  PROT 6.3* 6.1*  ALBUMIN 1.7* 1.7*  AST 66* 70*  ALT 29 28  ALKPHOS 161* 147*  BILITOT 9.1* 9.2*  BILIDIR 4.5*  --   IBILI 4.6*  --    PT/INR Recent Labs    04/24/18 1758 04/27/18 0316  LABPROT 25.8* 23.4*  INR 2.40 2.11   Hepatitis Panel Recent Labs    04/25/18 0946  HEPBSAG Negative  HCVAB <0.1  HEPAIGM Negative  HEPBIGM Negative    Ir Paracentesis  Result Date: 04/26/2018 INDICATION: Ascites likely secondary to alcohol abuse with cirrhosis. Also nodular omentum seen on CT scan. Request for diagnostic and therapeutic paracentesis up to 4 L. EXAM: ULTRASOUND GUIDED PARACENTESIS MEDICATIONS: 1% lidocaine 10 mL COMPLICATIONS: None immediate. PROCEDURE: Informed written consent was obtained from the patient after a discussion  of the risks, benefits and alternatives to treatment. A timeout was performed prior to the initiation of the procedure. Initial ultrasound scanning demonstrates a large amount of ascites within the right lower abdominal quadrant. The right lower abdomen was prepped and draped in the usual sterile fashion. 1% lidocaine with epinephrine was used for local anesthesia. Following this, a 19 gauge, 7-cm, Yueh catheter was introduced. An ultrasound image was saved for documentation purposes. The paracentesis was performed. The catheter was removed and a dressing was applied. The patient tolerated the procedure well without immediate post procedural complication. FINDINGS: A total of approximately 4 L  of clear yellow fluid was removed. Samples were sent to the laboratory as requested by the clinical team. IMPRESSION: Successful ultrasound-guided paracentesis yielding 4 liters of peritoneal fluid. Read by: Gareth Eagle, PA-C Electronically Signed   By: Aletta Edouard M.D.   On: 04/26/2018 14:00     Principal Problem:   Bleeding esophageal varices (HCC) Active Problems:   Abdominal pain   Hematemesis   Hematochezia   Alcohol abuse   Hyponatremia   Hypokalemia   Metabolic acidosis, normal anion gap (NAG)   Protein-calorie malnutrition, severe     LOS: 3 days   Tye Savoy ,NP 04/27/2018, 8:53 AM

## 2018-04-27 NOTE — Interval H&P Note (Signed)
History and Physical Interval Note:  04/27/2018 3:59 PM  Katie Holland  has presented today for surgery, with the diagnosis of variceal bleed  The various methods of treatment have been discussed with the patient and family. After consideration of risks, benefits and other options for treatment, the patient has consented to  Procedure(s): ESOPHAGOGASTRODUODENOSCOPY (EGD) (N/A) as a surgical intervention .  The patient's history has been reviewed, patient examined, no change in status, stable for surgery.  I have reviewed the patient's chart and labs.  Questions were answered to the patient's satisfaction.     Milus Banister

## 2018-04-28 ENCOUNTER — Encounter (HOSPITAL_COMMUNITY): Payer: Self-pay | Admitting: Gastroenterology

## 2018-04-28 DIAGNOSIS — E7220 Disorder of urea cycle metabolism, unspecified: Secondary | ICD-10-CM

## 2018-04-28 LAB — CBC
HCT: 26.8 % — ABNORMAL LOW (ref 36.0–46.0)
HEMATOCRIT: 27.1 % — AB (ref 36.0–46.0)
HEMOGLOBIN: 9.4 g/dL — AB (ref 12.0–15.0)
Hemoglobin: 8.9 g/dL — ABNORMAL LOW (ref 12.0–15.0)
MCH: 28.3 pg (ref 26.0–34.0)
MCH: 29.5 pg (ref 26.0–34.0)
MCHC: 33.2 g/dL (ref 30.0–36.0)
MCHC: 34.7 g/dL (ref 30.0–36.0)
MCV: 85 fL (ref 80.0–100.0)
MCV: 85.4 fL (ref 80.0–100.0)
NRBC: 1 % — AB (ref 0.0–0.2)
Platelets: 63 10*3/uL — ABNORMAL LOW (ref 150–400)
Platelets: 64 10*3/uL — ABNORMAL LOW (ref 150–400)
RBC: 3.14 MIL/uL — AB (ref 3.87–5.11)
RBC: 3.19 MIL/uL — ABNORMAL LOW (ref 3.87–5.11)
RDW: 20.9 % — ABNORMAL HIGH (ref 11.5–15.5)
RDW: 21.2 % — AB (ref 11.5–15.5)
WBC: 3.9 10*3/uL — AB (ref 4.0–10.5)
WBC: 4.3 10*3/uL (ref 4.0–10.5)
nRBC: 0.5 % — ABNORMAL HIGH (ref 0.0–0.2)

## 2018-04-28 LAB — COMPREHENSIVE METABOLIC PANEL
ALBUMIN: 2 g/dL — AB (ref 3.5–5.0)
ALT: 30 U/L (ref 0–44)
AST: 72 U/L — AB (ref 15–41)
Alkaline Phosphatase: 127 U/L — ABNORMAL HIGH (ref 38–126)
Anion gap: 6 (ref 5–15)
BUN: 12 mg/dL (ref 6–20)
CHLORIDE: 107 mmol/L (ref 98–111)
CO2: 23 mmol/L (ref 22–32)
Calcium: 8.2 mg/dL — ABNORMAL LOW (ref 8.9–10.3)
Creatinine, Ser: 0.71 mg/dL (ref 0.44–1.00)
GFR calc Af Amer: >60 mL/min (ref >60)
GFR calc non Af Amer: >60 mL/min (ref >60)
Glucose, Bld: 115 mg/dL — ABNORMAL HIGH (ref 70–99)
POTASSIUM: 3.3 mmol/L — AB (ref 3.5–5.1)
SODIUM: 136 mmol/L (ref 135–145)
Total Bilirubin: 9.2 mg/dL — ABNORMAL HIGH (ref 0.3–1.2)
Total Protein: 5.6 g/dL — ABNORMAL LOW (ref 6.5–8.1)

## 2018-04-28 LAB — ANTINUCLEAR ANTIBODIES, IFA: ANA Ab, IFA: NEGATIVE

## 2018-04-28 LAB — AMMONIA: AMMONIA: 73 umol/L — AB (ref 9–35)

## 2018-04-28 MED ORDER — LACTULOSE 10 GM/15ML PO SOLN: 20.0000 g | Freq: Three times a day (TID) | ORAL | Status: DC

## 2018-04-28 MED ORDER — PREDNISOLONE 5 MG PO TABS
40.0000 mg | ORAL_TABLET | Freq: Every day | ORAL | Status: DC
  Administered 2018-04-28 – 2018-05-03 (×6): 40 mg via ORAL
  Filled 2018-04-28 (×6): qty 8

## 2018-04-28 MED ORDER — MORPHINE SULFATE (PF) 2 MG/ML IV SOLN
0.5000 mg | INTRAVENOUS | Status: DC | PRN
  Administered 2018-04-28: 0.5 mg via INTRAVENOUS
  Filled 2018-04-28 (×3): qty 1

## 2018-04-28 MED ORDER — POTASSIUM CHLORIDE 20 MEQ PO PACK
40.0000 meq | PACK | Freq: Once | ORAL | Status: AC
  Administered 2018-04-28: 40 meq via ORAL
  Filled 2018-04-28 (×2): qty 2

## 2018-04-28 MED ORDER — LACTULOSE 10 GM/15ML PO SOLN
20.0000 g | Freq: Three times a day (TID) | ORAL | Status: DC
  Administered 2018-04-28: 20 g via ORAL
  Filled 2018-04-28: qty 30

## 2018-04-28 MED ORDER — LACTULOSE 10 GM/15ML PO SOLN: 30.0000 g | Freq: Three times a day (TID) | ORAL | Status: DC

## 2018-04-28 MED ORDER — LACTULOSE 10 GM/15ML PO SOLN
30.0000 g | Freq: Four times a day (QID) | ORAL | Status: DC
  Administered 2018-04-28 – 2018-04-29 (×7): 30 g via ORAL
  Filled 2018-04-28 (×9): qty 45

## 2018-04-28 NOTE — Progress Notes (Signed)
PROGRESS NOTE   Katie Holland  QQV:[id number]    DOB: 08/07/1965    DOA: 04/24/2018  PCP: Billie Ruddy, MD   I have briefly reviewed patients previous medical records in [facility].  Brief Narrative:  52 year old female, lives alone, PMH of alcoholic cirrhosis complicated by esophageal varices, ascites, encephalopathy, coagulopathy, ongoing alcohol abuse, tobacco abuse, medication noncompliance, GERD, presented to [facility] ED 04/24/2018 due to progressive abdominal distention, pain, couple of episodes of coffee-ground emesis and dark stools.  Admitted for acute upper GI/variceal bleed, acute blood loss anemia, cirrhosis complicated by ascites and possible SBP.  [facility] consulted, status post EGD and variceal banding 11/3.  Consulted IR for ultrasound-guided diagnostic and therapeutic paracentesis on 11/4.   Assessment & Plan:   Principal Problem:   Bleeding esophageal varices (HCC) Active Problems:   Abdominal pain   Hematemesis   Hematochezia   Alcohol abuse   Hyponatremia   Hypokalemia   Metabolic acidosis, normal anion gap (NAG)   Protein-calorie malnutrition, severe   Acute upper GI/variceal bleed:  -Most likely in the setting of esophageal variceal bleed, active bleed during endoscopy 04/25/2018, 04/27/2018, . [facility] for the endoscopy 04/25/2018, noted for scarring from previous banding, no active bleeding or stigmata of bleeding, she had 7 bands placed 2 large esophageal varices, noted to have congested duodenal mucosa -Endoscopy 04/27/2018, significant for mild portal gastropathy, where for additional bands were placed, no fresh blood was noted. -Management per rheumatology, she remains on IV Protonix, IV octreotide, and IV Rocephin.  Acute blood loss anemia complicating chronic anemia: -Secondary to GI bleed, most likely apple, variceal, so far required 2 units PRBC transfusion 04/27/2018. -Monitor CBC closely and transfuse as needed   alcoholic cirrhosis -Decompensated,  with recurrent ascites, jaundice thrombocytopenia, hyperammonemia and coagulopathy -With elevated LFTs, AST/alk phos is improving, total bilirubin still elevated . -With known thrombocytopenia, on SCD for DVT prophylaxis, monitor platelet count closely . -With coagulopathy, on vitamin K  -With hyperammonemia , increase her lactulose dosing and monitor closely  -With recurrent ascites, large volume, with 4 L paracentesis 04/26/2018, no evidence of SBP , appears to be reaccumulating, started on Aldactone and Lasix   Encephalopathy -She remains altered, lethargic, multifactorial, most likely toxic in the setting of alcohol withdrawals, and hepatic in the setting of hyperammonemia  Alcohol dependence:  - Patient understates the amount of alcohol she consumes.  Abstinence counseled. CIWA protocol.    Tobacco abuse: Cessation counseled.  Patient requests nicotine patch.  Abnormal CT abdomen/possible carcinomatosis: Ascitic fluid cytology 04/26/2018: No malignant cells identified.  Chronic hypotension: Likely related to her cirrhosis and third spacing.  Asymptomatic.  Avoid IV fluids.  Hypokalemia: Replaced.  Hypomagnesemia: Replaced.  Severe protein calorie malnutrition: Dietitian input appreciated.  GERD: PPI.   DVT prophylaxis: SCDs. Code Status: Full. Family Communication: none at bedside Disposition: DC home pending clinical improvement.  Not medically stable for discharge.   Consultants:   GI.  Procedures:  EGD 04/25/2018:  Impression:  - Z-line regular. - Large esophageal varices with red markings. Banded x 7 with good result - Normal stomach. - Congested duodenal mucosa.  Recommendations: - Return patient to hospital ward for ongoing care. - NPO for now, clear liquids later today okay if stable - Continue present medications. - Full recommendations per consult note - GI service will continue to follow  Antimicrobials:  IV ceftriaxone 11/2  >   Subjective: Patient unable to provide any complaints, remains drowsy, per staff no significant events overnight .  Objective:  Vitals:   04/28/18 0451 04/28/18 0746 04/28/18 1200 04/28/18 1241  BP:  94/61  95/74  Pulse:  (!) 102  (!) 102  Resp:  17  18  Temp: 98 F (36.7 C) 98.3 F (36.8 C) 98.3 F (36.8 C) 98.4 F (36.9 C)  TempSrc: Oral Oral Oral Oral  SpO2:  98%  98%  Weight:      Height:        Examination:  Sleeping comfortably, but wakes up to verbal stimuli, confused, unable to answer questions appropriately, no apparent distress, asterixis plus  Symmetrical Chest wall movement, Good air movement bilaterally, CTAB RRR,No Gallops,Rubs or new Murmurs, No Parasternal Heave +ve B.Sounds, mildly distended as it appears ascites is reaccumulating , No tenderness, No rebound - guarding or rigidity. No Cyanosis, Clubbing or edema, No new Rash or bruise     Data Reviewed: I have personally reviewed following labs and imaging studies  CBC: Recent Labs  Lab 04/24/18 1657  04/25/18 0946 04/26/18 0451 04/27/18 0316 04/28/18 0001 04/28/18 0332  WBC 6.1   < > 5.7 7.2 5.2 4.3 3.9*  NEUTROABS 4.0  --   --   --   --   --   --   HGB 9.6*   < > 8.9* 8.3* 7.4* 9.4* 8.9*  HCT 28.2*   < > 26.8* 25.4* 22.3* 27.1* 26.8*  MCV 86.0   < > 86.2 86.7 87.1 85.0 85.4  PLT PLATELET CLUMPS NOTED ON SMEAR, COUNT APPEARS DECREASED   < > 67* 83* 75* 63* 64*   < > = values in this interval not displayed.   Basic Metabolic Panel: Recent Labs  Lab 04/24/18 2343 04/25/18 0310 04/25/18 0946 04/26/18 0451 04/27/18 0316 04/28/18 0332  NA  --  134* 136 135 134* 136  K  --  3.8 4.1 4.3 4.0 3.3*  CL  --  105 104 108 107 107  CO2  --  22 20* 21* 21* 23  GLUCOSE  --  94 83 93 83 115*  BUN  --  '7 7 14 16 12  '$ CREATININE  --  1.01* 0.98 1.10* 0.98 0.71  CALCIUM  --  8.2* 8.3* 8.1* 7.9* 8.2*  MG 1.6*  --  2.3 2.2  --   --    Liver Function Tests: Recent Labs  Lab 04/24/18 1657  04/25/18 0310 04/26/18 0451 04/28/18 0332  AST 80* 66* 70* 72*  ALT 32 '29 28 30  '$ ALKPHOS 184* 161* 147* 127*  BILITOT 8.9* 9.1* 9.2* 9.2*  PROT 6.7 6.3* 6.1* 5.6*  ALBUMIN 1.9* 1.7* 1.7* 2.0*   Coagulation Profile: Recent Labs  Lab 04/24/18 1758 04/27/18 0316  INR 2.40 2.11     Recent Results (from the past 240 hour(s))  MRSA PCR Screening     Status: None   Collection Time: 04/25/18  3:25 AM  Result Value Ref Range Status   MRSA by PCR NEGATIVE NEGATIVE Final    Comment:        The GeneXpert MRSA Assay (FDA approved for NASAL specimens only), is one component of a comprehensive MRSA colonization surveillance program. It is not intended to diagnose MRSA infection nor to guide or monitor treatment for MRSA infections. Performed at [facility], [place] [address], [place], [place] [address]   Gram stain     Status: None   Collection Time: 04/26/18  1:34 PM  Result Value Ref Range Status   Specimen Description FLUID  Final  Special Requests NONE  Final   Gram Stain   Final    WBC PRESENT, PREDOMINANTLY MONONUCLEAR NO ORGANISMS SEEN CYTOSPIN SMEAR Performed at [facility], [place] [address] [place], [place] [address]    Report Status 04/26/2018 FINAL  Final  Culture, body fluid-bottle     Status: None (Preliminary result)   Collection Time: 04/26/18  3:30 PM  Result Value Ref Range Status   Specimen Description PERITONEAL CAVITY  Final   Special Requests NONE  Final   Culture   Final    NO GROWTH 2 DAYS Performed at [facility], [place] [address], [place], [place] [address]    Report Status PENDING  Incomplete         Radiology Studies: Ir Paracentesis  Result Date: 04/26/2018 INDICATION: Ascites likely secondary to alcohol abuse with cirrhosis. Also nodular omentum seen on CT scan. Request for diagnostic and therapeutic paracentesis up to 4 L. EXAM: ULTRASOUND GUIDED PARACENTESIS MEDICATIONS: 1% lidocaine 10 mL COMPLICATIONS: None  immediate. PROCEDURE: Informed written consent was obtained from the patient after a discussion of the risks, benefits and alternatives to treatment. A timeout was performed prior to the initiation of the procedure. Initial ultrasound scanning demonstrates a large amount of ascites within the right lower abdominal quadrant. The right lower abdomen was prepped and draped in the usual sterile fashion. 1% lidocaine with epinephrine was used for local anesthesia. Following this, a 19 gauge, 7-cm, Yueh catheter was introduced. An ultrasound image was saved for documentation purposes. The paracentesis was performed. The catheter was removed and a dressing was applied. The patient tolerated the procedure well without immediate post procedural complication. FINDINGS: A total of approximately 4 L of clear yellow fluid was removed. Samples were sent to the laboratory as requested by the clinical team. IMPRESSION: Successful ultrasound-guided paracentesis yielding 4 liters of peritoneal fluid. Read by: Gareth Eagle, PA-C Electronically Signed   By: Aletta Edouard M.D.   On: 04/26/2018 14:00        Scheduled Meds: . sodium chloride   Intravenous Once  . feeding supplement  1 Container Oral TID BM  . folic acid  1 mg Oral Daily  . furosemide  40 mg Oral Daily  . lactulose  20 g Oral TID  . multivitamin with minerals  1 tablet Oral Daily  . nicotine  7 mg Transdermal Daily  . pantoprazole (PROTONIX) IV  40 mg Intravenous Q12H  . phytonadione  10 mg Subcutaneous Daily  . spironolactone  100 mg Oral Daily  . thiamine  100 mg Oral Daily   Or  . thiamine  100 mg Intravenous Daily   Continuous Infusions: . cefTRIAXone (ROCEPHIN)  IV 1 g (04/28/18 0655)  . octreotide  (SANDOSTATIN)    IV infusion 50 mcg/hr (04/28/18 5176)     LOS: 4 days     Phillips Climes, MD,. [facility] Pager [phone]  If 7PM-7AM, please contact night-coverage [email or web address] Password [email or web address] 04/28/2018, 1:09 PM

## 2018-04-28 NOTE — Progress Notes (Addendum)
Daily Rounding Note  04/28/2018, 9:42 AM  LOS: 4 days   SUBJECTIVE:   Chief complaint: CGE, variceal bleed, cirrhosis    Remains encephalopathic, somnolent.   Last BM was the bloody stool she passed before EGD # 2 yesterday.   Pt denies abd pain, denies nausea.    OBJECTIVE:         Vital signs in last 24 hours:    Temp:  [97.2 F (36.2 C)-98.4 F (36.9 C)] 98.3 F (36.8 C) (11/06 0746) Pulse Rate:  [82-103] 102 (11/06 0746) Resp:  [13-38] 17 (11/06 0746) BP: (74-128)/(41-97) 94/61 (11/06 0746) SpO2:  [94 %-100 %] 98 % (11/06 0746) Weight:  [68.7 kg] 68.7 kg (11/05 1559) Last BM Date: 04/27/18 Filed Weights   04/26/18 1031 04/27/18 0623 04/27/18 1559  Weight: 70 kg 68.7 kg 68.7 kg   General: somnolent, difficulty maintaining arousal   Heart: RRR Chest: clear in front.  No dyspnea or cough Abdomen: soft, slight distention, NT.  Active BS  Extremities: no CCE Neuro/Psych:  Oriented to self, "institution", [place], 2017.   Automatically flexed hands, wrists as I began to assess arms and hands for asterixis, which is present.    Intake/Output from previous day: 11/05 0701 - 11/06 0700 In: 1413.8 [I.V.:582.8; Blood:831] Out: 650 [Urine:650]  Intake/Output this shift: No intake/output data recorded.  Lab Results: Recent Labs    04/27/18 0316 04/28/18 0001 04/28/18 0332  WBC 5.2 4.3 3.9*  HGB 7.4* 9.4* 8.9*  HCT 22.3* 27.1* 26.8*  PLT 75* 63* 64*   BMET Recent Labs    04/26/18 0451 04/27/18 0316 04/28/18 0332  NA 135 134* 136  K 4.3 4.0 3.3*  CL 108 107 107  CO2 21* 21* 23  GLUCOSE 93 83 115*  BUN '14 16 12  '$ CREATININE 1.10* 0.98 0.71  CALCIUM 8.1* 7.9* 8.2*   LFT Recent Labs    04/26/18 0451 04/28/18 0332  PROT 6.1* 5.6*  ALBUMIN 1.7* 2.0*  AST 70* 72*  ALT 28 30  ALKPHOS 147* 127*  BILITOT 9.2* 9.2*   PT/INR Recent Labs    04/27/18 0316  LABPROT 23.4*  INR 2.11   Hepatitis  Panel Recent Labs    04/25/18 0946  HEPBSAG Negative  HCVAB <0.1  HEPAIGM Negative  HEPBIGM Negative    Studies/Results: Ir Paracentesis  Result Date: 04/26/2018 INDICATION: Ascites likely secondary to alcohol abuse with cirrhosis. Also nodular omentum seen on CT scan. Request for diagnostic and therapeutic paracentesis up to 4 L. EXAM: ULTRASOUND GUIDED PARACENTESIS MEDICATIONS: 1% lidocaine 10 mL COMPLICATIONS: None immediate. PROCEDURE: Informed written consent was obtained from the patient after a discussion of the risks, benefits and alternatives to treatment. A timeout was performed prior to the initiation of the procedure. Initial ultrasound scanning demonstrates a large amount of ascites within the right lower abdominal quadrant. The right lower abdomen was prepped and draped in the usual sterile fashion. 1% lidocaine with epinephrine was used for local anesthesia. Following this, a 19 gauge, 7-cm, Yueh catheter was introduced. An ultrasound image was saved for documentation purposes. The paracentesis was performed. The catheter was removed and a dressing was applied. The patient tolerated the procedure well without immediate post procedural complication. FINDINGS: A total of approximately 4 L of clear yellow fluid was removed. Samples were sent to the laboratory as requested by the clinical team. IMPRESSION: Successful ultrasound-guided paracentesis yielding 4 liters of peritoneal fluid. Read by: Gareth Eagle, PA-C  Electronically Signed   By: Aletta Edouard M.D.   On: 04/26/2018 14:00   Scheduled Meds: . sodium chloride   Intravenous Once  . feeding supplement  1 Container Oral TID BM  . folic acid  1 mg Oral Daily  . furosemide  40 mg Oral Daily  . lactulose  20 g Oral TID  . multivitamin with minerals  1 tablet Oral Daily  . nicotine  7 mg Transdermal Daily  . pantoprazole (PROTONIX) IV  40 mg Intravenous Q12H  . phytonadione  10 mg Subcutaneous Daily  . spironolactone  100 mg  Oral Daily  . thiamine  100 mg Oral Daily   Or  . thiamine  100 mg Intravenous Daily   Continuous Infusions: . cefTRIAXone (ROCEPHIN)  IV 1 g (04/28/18 0655)  . octreotide  (SANDOSTATIN)    IV infusion 50 mcg/hr (04/28/18 0649)   PRN Meds:.LORazepam, morphine injection, ondansetron (ZOFRAN) IV  ASSESMENT:   *  CG, dark emesis.    Esophageal variceal banding in Spring 2019.   EGD 04/25/18:  Scarring from previous banding, no active bleeding or stigmata of bleeding.  7 bands placed to large esophageal varices. Congested duodenal mucosa.    EGD 04/27/18: mild portal gastropathy  Retained esophageal variceal bands, 4 additional bands placed.  No fresh or old blood seen.   On Octreotide (day 4), Protonix 40 IV BID, Rocephin, .    *    Blood loss anemia.  S/p PRBC x 1 U 11/5.    *   ETOH cirrhosis, decmopensated.  ETOH hepatitis.  T bili remains elevated;  AST and alk phos improved.  Continues to drink heavily.   Hep ABC negative.   Experiencing ETOH withdrawal sxs, prn Lorazepam.   MELD 23, MELD-Na 25.  Discriminant fx score 62.    *  Large volume ascites, abdominal pain.   Started on diuretics in spring 2019.   4 liter paracentesis op 11/4, no signs of SBP.  Weight 70 >> 68.7 Kg in last 3 days.   Aldactone 100 mg, Lasix 40 mg daily.    *   Hepatic encephalopathy, treated with Lactulose starting spring 2019.  Not sure if still taking.  TID Lactulose in place.    *   Nodular omentum on CT, concerning for carcinomatosis.  No malignant cells on ascitic cytology study.     *   Thrombocytopenia.    *    Coagulopathy.  S/p FFP x 2 on 11/6. Daily x 3 days sq Vit K in place.    PLAN   *  Queried care everywhere for records from [facility], "no matching pt's found".   *   Continue supportive care.  ? How long to continue Octreotide.  She is beyond 72 hours of this. Also consider switching to PO Protonix, will d/w MD.    *   Leave on clear liquids.   ? Add prednisolone?        Azucena Freed  04/28/2018, 9:42 AM Phone [phone]

## 2018-04-29 LAB — COMPREHENSIVE METABOLIC PANEL
ALT: 37 U/L (ref 0–44)
AST: 74 U/L — ABNORMAL HIGH (ref 15–41)
Albumin: 1.8 g/dL — ABNORMAL LOW (ref 3.5–5.0)
Alkaline Phosphatase: 132 U/L — ABNORMAL HIGH (ref 38–126)
Anion gap: 8 (ref 5–15)
BILIRUBIN TOTAL: 8.8 mg/dL — AB (ref 0.3–1.2)
BUN: 7 mg/dL (ref 6–20)
CO2: 21 mmol/L — ABNORMAL LOW (ref 22–32)
CREATININE: 0.76 mg/dL (ref 0.44–1.00)
Calcium: 8.5 mg/dL — ABNORMAL LOW (ref 8.9–10.3)
Chloride: 109 mmol/L (ref 98–111)
GFR calc non Af Amer: >60 mL/min (ref >60)
Glucose, Bld: 155 mg/dL — ABNORMAL HIGH (ref 70–99)
POTASSIUM: 3.4 mmol/L — AB (ref 3.5–5.1)
Sodium: 138 mmol/L (ref 135–145)
TOTAL PROTEIN: 6 g/dL — AB (ref 6.5–8.1)

## 2018-04-29 LAB — PREPARE FRESH FROZEN PLASMA
UNIT DIVISION: 0
Unit division: 0

## 2018-04-29 LAB — CBC
HEMATOCRIT: 27.7 % — AB (ref 36.0–46.0)
Hemoglobin: 9.2 g/dL — ABNORMAL LOW (ref 12.0–15.0)
MCH: 28.3 pg (ref 26.0–34.0)
MCHC: 33.2 g/dL (ref 30.0–36.0)
MCV: 85.2 fL (ref 80.0–100.0)
Platelets: 80 10*3/uL — ABNORMAL LOW (ref 150–400)
RBC: 3.25 MIL/uL — ABNORMAL LOW (ref 3.87–5.11)
RDW: 21.4 % — ABNORMAL HIGH (ref 11.5–15.5)
WBC: 3.8 10*3/uL — ABNORMAL LOW (ref 4.0–10.5)
nRBC: 0 % (ref 0.0–0.2)

## 2018-04-29 LAB — BPAM FFP
BLOOD PRODUCT EXPIRATION DATE: 201911072359
BLOOD PRODUCT EXPIRATION DATE: 201911092359
ISSUE DATE / TIME: 201911060251
ISSUE DATE / TIME: 201911060005
Unit Type and Rh: 6200
Unit Type and Rh: 6200

## 2018-04-29 LAB — AMMONIA: Ammonia: 49 umol/L — ABNORMAL HIGH (ref 9–35)

## 2018-04-29 MED ORDER — POTASSIUM CHLORIDE CRYS ER 20 MEQ PO TBCR
40.0000 meq | EXTENDED_RELEASE_TABLET | Freq: Once | ORAL | Status: AC
  Administered 2018-04-29: 40 meq via ORAL
  Filled 2018-04-29: qty 2

## 2018-04-29 MED ORDER — FUROSEMIDE 80 MG PO TABS
80.0000 mg | ORAL_TABLET | Freq: Every day | ORAL | Status: DC
  Administered 2018-04-30 – 2018-05-03 (×4): 80 mg via ORAL
  Filled 2018-04-29 (×4): qty 1

## 2018-04-29 MED ORDER — SPIRONOLACTONE 25 MG PO TABS
100.0000 mg | ORAL_TABLET | Freq: Two times a day (BID) | ORAL | Status: DC
  Administered 2018-04-29 – 2018-05-03 (×8): 100 mg via ORAL
  Filled 2018-04-29 (×8): qty 4

## 2018-04-29 MED ORDER — PANTOPRAZOLE SODIUM 40 MG PO TBEC
40.0000 mg | DELAYED_RELEASE_TABLET | Freq: Two times a day (BID) | ORAL | Status: DC
  Administered 2018-04-29 – 2018-04-30 (×2): 40 mg via ORAL
  Filled 2018-04-29 (×2): qty 1

## 2018-04-29 NOTE — Progress Notes (Signed)
Daily Rounding Note  04/29/2018, 11:52 AM  LOS: 5 days   SUBJECTIVE:   Chief complaint:    No BMs yet today, 2 stools reported yesterday but not clear what color they were..  Some pain in the mid to lower abdomen.  Appetite pretty good, ate 100% of yesterday's meals but this morning she did not eat as much. Has not required Ativan since 11/4.  Mental status is improving.   OBJECTIVE:         Vital signs in last 24 hours:    Temp:  [98.2 F (36.8 C)-98.6 F (37 C)] 98.6 F (37 C) (11/07 0812) Pulse Rate:  [25-110] 110 (11/07 0812) Resp:  [12-19] 19 (11/07 0812) BP: (94-109)/(64-75) 108/75 (11/07 0812) SpO2:  [94 %-98 %] 94 % (11/07 0812) Last BM Date: 04/28/18 Filed Weights   04/26/18 1031 04/27/18 0623 04/27/18 1559  Weight: 70 kg 68.7 kg 68.7 kg   General: More alert.  Seems very depressed.  Laconic. Heart: RRR. Chest: Clear bilaterally.  No labored breathing, no cough. Abdomen: Soft, protuberant.  Nontender.  Bowel sounds normal but hypoactive. Extremities: No CCE. Neuro/Psych: Asterixis resolved.  No tremor.  Oriented to self, [facility] but not able to tell me the year accurately  Intake/Output from previous day: 11/06 0701 - 11/07 0700 In: 118 [P.O.:118] Out: -   Intake/Output this shift: Total I/O In: 237 [Other:237] Out: -   Lab Results: Recent Labs    04/28/18 0001 04/28/18 0332 04/29/18 0451  WBC 4.3 3.9* 3.8*  HGB 9.4* 8.9* 9.2*  HCT 27.1* 26.8* 27.7*  PLT 63* 64* 80*   BMET Recent Labs    04/27/18 0316 04/28/18 0332 04/29/18 0451  NA 134* 136 138  K 4.0 3.3* 3.4*  CL 107 107 109  CO2 21* 23 21*  GLUCOSE 83 115* 155*  BUN 16 12 7   CREATININE 0.98 0.71 0.76  CALCIUM 7.9* 8.2* 8.5*   LFT Recent Labs    04/28/18 0332 04/29/18 0451  PROT 5.6* 6.0*  ALBUMIN 2.0* 1.8*  AST 72* 74*  ALT 30 37  ALKPHOS 127* 132*  BILITOT 9.2* 8.8*   PT/INR Recent Labs   04/27/18 0316  LABPROT 23.4*  INR 2.11   Hepatitis Panel No results for input(s): HEPBSAG, HCVAB, HEPAIGM, HEPBIGM in the last 72 hours.  Studies/Results: No results found.   Scheduled Meds: . sodium chloride   Intravenous Once  . feeding supplement  1 Container Oral TID BM  . folic acid  1 mg Oral Daily  . furosemide  40 mg Oral Daily  . lactulose  30 g Oral QID  . multivitamin with minerals  1 tablet Oral Daily  . nicotine  7 mg Transdermal Daily  . pantoprazole (PROTONIX) IV  40 mg Intravenous Q12H  . phytonadione  10 mg Subcutaneous Daily  . prednisoLONE  40 mg Oral Daily  . spironolactone  100 mg Oral Daily  . thiamine  100 mg Oral Daily   Continuous Infusions: . cefTRIAXone (ROCEPHIN)  IV 1 g (04/29/18 0519)   PRN Meds:.LORazepam, morphine injection, ondansetron (ZOFRAN) IV   ASSESMENT:   *  CG, dark emesis.    Esophageal variceal banding in Spring 2019.   EGD 04/25/18:  Scarring from previous banding, no active bleeding or stigmata of bleeding.  7 bands placed to large esophageal varices. Congested duodenal mucosa.    EGD 04/27/18: mild portal gastropathy  Retained esophageal variceal bands,  4 additional bands placed.  No fresh or old blood seen.   Finished 4 d Octreotide on 11/6.  Protonix 40 IV BID, Rocephin, .    *    Blood loss anemia.  S/p PRBC x 1 U 11/5.  Hgb overall improved.    *   ETOH cirrhosis, decmopensated.  ETOH hepatitis.   Continues to drink heavily at home, ETOH withdrawal observed.   Hep ABC negative.  MELD 23, MELD-Na 25.  Discriminant fx score 62.  AFP 1.4.   Prednisolone started 11/6.  LFT's overall stable to improved.     *  Large volume ascites, abdominal pain.   Started on diuretics in spring 2019.   4 liter paracentesis op 11/4, no signs of SBP.  Weight 70 >> 68.7 Kg in last 3 days.   Aldactone 100 mg, Lasix 40 mg daily.    *   Hepatic encephalopathy, treated with Lactulose starting spring 2019.  Not sure if still taking.  TID  Lactulose in place.    *   Nodular omentum on CT, concerning for carcinomatosis.  No malignant cells on ascitic cytology study.     *   Thrombocytopenia.  improved, non-critical at 80.    *    Coagulopathy.  S/p FFP x 2 on 11/6. Daily x 3 days sq Vit K in place.      PLAN   *  Continue supportive care.  Switch to PO BID Protonix.  Continue Rocephin, lactulose. ?  Increase dose of Aldactone and Lasix?,  Her renal function is normal and should tolerate increase diuretics.  *   PT/INR, CMET, CBC in AM.    *   Does the CT finding of nodular omentum concerning for carcinomatosis need any further work-up given that there were no malignant cells on ascitic fluid?    Azucena Freed  04/29/2018, 11:52 AM Phone [phone]

## 2018-04-29 NOTE — Progress Notes (Signed)
PROGRESS NOTE   Katie Holland  PZW:[id number]    DOB: 1965/07/13    DOA: 04/24/2018  PCP: Billie Ruddy, MD   I have briefly reviewed patients previous medical records in [facility].  Brief Narrative:  52 year old female, lives alone, PMH of alcoholic cirrhosis complicated by esophageal varices, ascites, encephalopathy, coagulopathy, ongoing alcohol abuse, tobacco abuse, medication noncompliance, GERD, presented to [facility] ED 04/24/2018 due to progressive abdominal distention, pain, couple of episodes of coffee-ground emesis and dark stools.  Admitted for acute upper GI/variceal bleed, acute blood loss anemia, cirrhosis complicated by ascites and possible SBP.  [facility] consulted, status post EGD and variceal banding 11/3.  As well 11/3, consulted IR for ultrasound-guided diagnostic and therapeutic paracentesis on 11/4.   Assessment & Plan:   Principal Problem:   Bleeding esophageal varices (HCC) Active Problems:   Alcoholic cirrhosis of liver with ascites (HCC)   Abdominal pain   Hematemesis   Hematochezia   Alcohol abuse   Hyponatremia   Hypokalemia   Metabolic acidosis, normal anion gap (NAG)   Protein-calorie malnutrition, severe   Acute upper GI/variceal bleed:  -Most likely in the setting of esophageal variceal bleed, active bleed during endoscopy 04/25/2018, 04/27/2018, . [facility] for the endoscopy 04/25/2018, noted for scarring from previous banding, no active bleeding or stigmata of bleeding, she had 7 bands placed 2 large esophageal varices, noted to have congested duodenal mucosa -Endoscopy 04/27/2018, significant for mild portal gastropathy, where for additional bands were placed, no fresh blood was noted. -Globin appears to be stable, no recurrence of GI bleed, Protonix drip has been stopped, as well IV octreotide drip stopped 04/28/2018, continue with IV Rocephin .  Acute blood loss anemia complicating chronic anemia: -Secondary to GI bleed, most likely upper, variceal, so  far required 2 units PRBC transfusion 04/27/2018. -Monitor CBC closely and transfuse as needed, hemoglobin remained stable so far   alcoholic cirrhosis -Decompensated, with recurrent ascites, jaundice thrombocytopenia, hyperammonemia and coagulopathy -With elevated LFTs, AST/alk phos is improving, total bilirubin still elevated .  Please see discussion below regarding alcoholic hepatitis. -With known thrombocytopenia, on SCD for DVT prophylaxis, monitor platelet count closely . -With coagulopathy, on vitamin K  -With recurrent ascites, large volume, with 4 L paracentesis 04/26/2018, no evidence of SBP , appears to be reaccumulating, started on Aldactone and Lasix dose has been increased today per GI  Alcoholic hepatitis -Started on prednisone  Hyperammonemia -Due to liver failure, ammonia level has been trending down, as well mentation has improved today, continue with current dose of lactulose, repeat ammonia in a.m.  Encephalopathy - multifactorial, most likely toxic in the setting of alcohol withdrawals, and hepatic in the setting of hyperammonemia -More awake and conversant and appropriate today  Alcohol dependence:  - Patient understates the amount of alcohol she consumes.  Abstinence counseled. CIWA protocol.    Tobacco abuse: Cessation counseled.  Patient requests nicotine patch.  Abnormal CT abdomen/possible carcinomatosis: Ascitic fluid cytology 04/26/2018: No malignant cells identified.  Chronic hypotension: Likely related to her cirrhosis and third spacing.  Asymptomatic.  Avoid IV fluids.  Hypokalemia: Replaced.  Hypomagnesemia: Replaced.  Severe protein calorie malnutrition: Dietitian input appreciated.  GERD: PPI.  Generalized weakness and severe deconditioning -He is more awake and alert today, will try to get out of bed to chair and consult PT  DVT prophylaxis: SCDs. Code Status: Full. Family Communication: none at bedside Disposition: We will consult  PT  Consultants:  [facility].  Procedures:  EGD 04/25/2018: 04/27/2018  Paracentesis 2 units PRBC transfusion  Impression:  - Z-line regular. - Large esophageal varices with red markings. Banded x 7 with good result - Normal stomach. - Congested duodenal mucosa.  Recommendations: - Return patient to hospital ward for ongoing care. - NPO for now, clear liquids later today okay if stable - Continue present medications. - Full recommendations per consult note - GI service will continue to follow  Antimicrobials:  IV ceftriaxone 11/2 >   Subjective: Patient unable to provide any complaints, remains drowsy, per staff no significant events overnight .  Objective:  Vitals:   04/29/18 0032 04/29/18 0419 04/29/18 0812 04/29/18 1211  BP: 105/66 104/71 108/75 127/79  Pulse:  (!) 103 (!) 110   Resp: '12 16 19 19  '$ Temp: 98.4 F (36.9 C) 98.4 F (36.9 C) 98.6 F (37 C) 97.9 F (36.6 C)  TempSrc: Oral Oral Oral Oral  SpO2:  96% 94%   Weight:      Height:        Examination:  More awake and appropriate today, answer couple questions appropriately by [place] now, appears to be more coherent, but she remains confused/encephalopathic . Good air entry bilaterally, symmetrical chest wall movement, clear to auscultation  Regular rate and rhythm, no rubs murmurs gallops  Bowel sounds present, nontender, no rebound, no guarding, ascites present  Lower extremities with no edema, clubbing or cyanosis     Data Reviewed: I have personally reviewed following labs and imaging studies  CBC: Recent Labs  Lab 04/24/18 1657  04/26/18 0451 04/27/18 0316 04/28/18 0001 04/28/18 0332 04/29/18 0451  WBC 6.1   < > 7.2 5.2 4.3 3.9* 3.8*  NEUTROABS 4.0  --   --   --   --   --   --   HGB 9.6*   < > 8.3* 7.4* 9.4* 8.9* 9.2*  HCT 28.2*   < > 25.4* 22.3* 27.1* 26.8* 27.7*  MCV 86.0   < > 86.7 87.1 85.0 85.4 85.2  PLT PLATELET CLUMPS NOTED ON SMEAR, COUNT APPEARS DECREASED   < > 83* 75* 63* 64*  80*   < > = values in this interval not displayed.   Basic Metabolic Panel: Recent Labs  Lab 04/24/18 2343  04/25/18 0946 04/26/18 0451 04/27/18 0316 04/28/18 0332 04/29/18 0451  NA  --    < > 136 135 134* 136 138  K  --    < > 4.1 4.3 4.0 3.3* 3.4*  CL  --    < > 104 108 107 107 109  CO2  --    < > 20* 21* 21* 23 21*  GLUCOSE  --    < > 83 93 83 115* 155*  BUN  --    < > '7 14 16 12 7  '$ CREATININE  --    < > 0.98 1.10* 0.98 0.71 0.76  CALCIUM  --    < > 8.3* 8.1* 7.9* 8.2* 8.5*  MG 1.6*  --  2.3 2.2  --   --   --    < > = values in this interval not displayed.   Liver Function Tests: Recent Labs  Lab 04/24/18 1657 04/25/18 0310 04/26/18 0451 04/28/18 0332 04/29/18 0451  AST 80* 66* 70* 72* 74*  ALT 32 '29 28 30 '$ 37  ALKPHOS 184* 161* 147* 127* 132*  BILITOT 8.9* 9.1* 9.2* 9.2* 8.8*  PROT 6.7 6.3* 6.1* 5.6* 6.0*  ALBUMIN 1.9* 1.7* 1.7* 2.0* 1.8*   Coagulation Profile: Recent  Labs  Lab 04/24/18 1758 04/27/18 0316  INR 2.40 2.11     Recent Results (from the past 240 hour(s))  MRSA PCR Screening     Status: None   Collection Time: 04/25/18  3:25 AM  Result Value Ref Range Status   MRSA by PCR NEGATIVE NEGATIVE Final    Comment:        The GeneXpert MRSA Assay (FDA approved for NASAL specimens only), is one component of a comprehensive MRSA colonization surveillance program. It is not intended to diagnose MRSA infection nor to guide or monitor treatment for MRSA infections. Performed at [facility], [place] [address] [place], [place] [address]   Gram stain     Status: None   Collection Time: 04/26/18  1:34 PM  Result Value Ref Range Status   Specimen Description FLUID  Final   Special Requests NONE  Final   Gram Stain   Final    WBC PRESENT, PREDOMINANTLY MONONUCLEAR NO ORGANISMS SEEN CYTOSPIN SMEAR Performed at [facility], [place] [address], [place], [place] [address]    Report Status 04/26/2018 FINAL  Final  Culture, body fluid-bottle      Status: None (Preliminary result)   Collection Time: 04/26/18  3:30 PM  Result Value Ref Range Status   Specimen Description PERITONEAL CAVITY  Final   Special Requests NONE  Final   Culture   Final    NO GROWTH 3 DAYS Performed at [facility] [address] [place], [place] [address]    Report Status PENDING  Incomplete         Radiology Studies: No results found.      Scheduled Meds: . sodium chloride   Intravenous Once  . feeding supplement  1 Container Oral TID BM  . folic acid  1 mg Oral Daily  . [START ON 04/30/2018] furosemide  80 mg Oral Daily  . lactulose  30 g Oral QID  . multivitamin with minerals  1 tablet Oral Daily  . nicotine  7 mg Transdermal Daily  . pantoprazole  40 mg Oral BID  . prednisoLONE  40 mg Oral Daily  . spironolactone  100 mg Oral BID  . thiamine  100 mg Oral Daily   Continuous Infusions: . cefTRIAXone (ROCEPHIN)  IV 1 g (04/29/18 0519)     LOS: 5 days     Phillips Climes, MD,. [facility] Pager [phone]  If 7PM-7AM, please contact night-coverage [email or web address] Password [facility] 04/29/2018, 3:27 PM

## 2018-04-30 ENCOUNTER — Telehealth: Payer: Self-pay

## 2018-04-30 DIAGNOSIS — K7031 Alcoholic cirrhosis of liver with ascites: Secondary | ICD-10-CM

## 2018-04-30 LAB — COMPREHENSIVE METABOLIC PANEL
ALK PHOS: 112 U/L (ref 38–126)
ALT: 35 U/L (ref 0–44)
AST: 67 U/L — ABNORMAL HIGH (ref 15–41)
Albumin: 1.7 g/dL — ABNORMAL LOW (ref 3.5–5.0)
Anion gap: 8 (ref 5–15)
BUN: 7 mg/dL (ref 6–20)
CALCIUM: 8.7 mg/dL — AB (ref 8.9–10.3)
CHLORIDE: 109 mmol/L (ref 98–111)
CO2: 21 mmol/L — ABNORMAL LOW (ref 22–32)
CREATININE: 0.77 mg/dL (ref 0.44–1.00)
GFR calc Af Amer: >60 mL/min (ref >60)
Glucose, Bld: 170 mg/dL — ABNORMAL HIGH (ref 70–99)
Potassium: 3.5 mmol/L (ref 3.5–5.1)
SODIUM: 138 mmol/L (ref 135–145)
Total Bilirubin: 6.9 mg/dL — ABNORMAL HIGH (ref 0.3–1.2)
Total Protein: 5.6 g/dL — ABNORMAL LOW (ref 6.5–8.1)

## 2018-04-30 LAB — CBC
HCT: 26.4 % — ABNORMAL LOW (ref 36.0–46.0)
HEMOGLOBIN: 8.9 g/dL — AB (ref 12.0–15.0)
MCH: 29.1 pg (ref 26.0–34.0)
MCHC: 33.7 g/dL (ref 30.0–36.0)
MCV: 86.3 fL (ref 80.0–100.0)
NRBC: 0.3 % — AB (ref 0.0–0.2)
Platelets: 76 10*3/uL — ABNORMAL LOW (ref 150–400)
RBC: 3.06 MIL/uL — ABNORMAL LOW (ref 3.87–5.11)
RDW: 22 % — ABNORMAL HIGH (ref 11.5–15.5)
WBC: 6.8 10*3/uL (ref 4.0–10.5)

## 2018-04-30 LAB — AMMONIA: Ammonia: 60 umol/L — ABNORMAL HIGH (ref 9–35)

## 2018-04-30 LAB — PROTIME-INR
INR: 2.1
PROTHROMBIN TIME: 23.3 s — AB (ref 11.4–15.2)

## 2018-04-30 MED ORDER — LACTULOSE 10 GM/15ML PO SOLN
30.0000 g | ORAL | Status: AC
  Administered 2018-04-30 – 2018-05-01 (×6): 30 g via ORAL
  Filled 2018-04-30 (×5): qty 45

## 2018-04-30 MED ORDER — VITAMIN K1 10 MG/ML IJ SOLN
10.0000 mg | Freq: Every day | INTRAMUSCULAR | Status: DC
  Administered 2018-04-30: 10 mg via SUBCUTANEOUS
  Filled 2018-04-30: qty 1

## 2018-04-30 MED ORDER — PANTOPRAZOLE SODIUM 40 MG PO TBEC
40.0000 mg | DELAYED_RELEASE_TABLET | Freq: Every day | ORAL | Status: DC
  Administered 2018-05-01 – 2018-05-03 (×3): 40 mg via ORAL
  Filled 2018-04-30 (×3): qty 1

## 2018-04-30 MED ORDER — LACTULOSE 10 GM/15ML PO SOLN: 30.0000 g | Freq: Three times a day (TID) | ORAL | Status: DC

## 2018-04-30 NOTE — Evaluation (Signed)
Physical Therapy Evaluation Patient Details Name: Katie Holland MRN: [id number] DOB: [birth year] Today's Date: 04/30/2018   History of Present Illness  52 yo female with onset of esophageal varices with bleeding was admitted and referred to PT.  Her symptoms were evaluated by EGD on 11/5, paracentesis on 11/4, on CIWA protocol.  Has decompensated cirrhosis, encephalopathy, transfused one unit blood.   PMHx:  hepatitis, cirrhosis, anemia, LBP, jaundice,   Clinical Impression  Pt was seen for evaluation of mobility with notable control of standing with walker.  Pt is expecting to return home with HHPT per conversation with PT and expect her to need to use RW inside now instead of SPC as previously.  Pt in agreement with this plan so will work acutely on strengthening and balance to increase her transition to independence faster.    Follow Up Recommendations Home health PT;Supervision for mobility/OOB    Equipment Recommendations  Rolling walker with 5" wheels(if her walker is not in good shape)    Recommendations for Other Services       Precautions / Restrictions Precautions Precautions: Fall Restrictions Weight Bearing Restrictions: No      Mobility  Bed Mobility Overal bed mobility: Modified Independent                Transfers Overall transfer level: Needs assistance Equipment used: Rolling walker (2 wheeled);1 person hand held assist Transfers: Sit to/from Stand Sit to Stand: Min guard         General transfer comment: min guard for safety  Ambulation/Gait Ambulation/Gait assistance: Min guard Gait Distance (Feet): 35 Feet Assistive device: Rolling walker (2 wheeled);1 person hand held assist Gait Pattern/deviations: Step-through pattern;Decreased stride length;Wide base of support;Drifts right/left Gait velocity: reduced Gait velocity interpretation: <1.31 ft/sec, indicative of household ambulator General Gait Details: cued pt to direct walker with  purpose  Stairs            Wheelchair Mobility    Modified Rankin (Stroke Patients Only)       Balance Overall balance assessment: Needs assistance Sitting-balance support: Feet supported;Bilateral upper extremity supported Sitting balance-Leahy Scale: Good     Standing balance support: Bilateral upper extremity supported;During functional activity Standing balance-Leahy Scale: Fair Standing balance comment: pt is unsafe with HHA and requires walker with dynamic mobility                             Pertinent Vitals/Pain Pain Assessment: No/denies pain    Home Living Family/patient expects to be discharged to:: Private residence Living Arrangements: Alone Available Help at Discharge: Family;Available PRN/intermittently Type of Home: House Home Access: Level entry     Home Layout: One level Home Equipment: Walker - 2 wheels;Cane - single point Additional Comments: uses SPC inside and RW outdoors    Prior Function Level of Independence: Independent with assistive device(s)               Hand Dominance   Dominant Hand: Right    Extremity/Trunk Assessment   Upper Extremity Assessment Upper Extremity Assessment: Overall WFL for tasks assessed    Lower Extremity Assessment Lower Extremity Assessment: Generalized weakness    Cervical / Trunk Assessment Cervical / Trunk Assessment: Normal  Communication   Communication: No difficulties  Cognition Arousal/Alertness: Awake/alert Behavior During Therapy: WFL for tasks assessed/performed Overall Cognitive Status: Within Functional Limits for tasks assessed  General Comments: pt reports dizziness and has no corroborating vitals      General Comments      Exercises Other Exercises Other Exercises: LE strength is 4+ hips and WFL otherwise   Assessment/Plan    PT Assessment Patient needs continued PT services  PT Problem List Decreased  strength;Decreased range of motion;Decreased activity tolerance;Decreased balance;Decreased mobility;Decreased coordination;Decreased knowledge of use of DME;Decreased safety awareness       PT Treatment Interventions DME instruction;Gait training;Functional mobility training;Therapeutic activities;Therapeutic exercise;Balance training;Neuromuscular re-education;Patient/family education    PT Goals (Current goals can be found in the Care Plan section)  Acute Rehab PT Goals Patient Stated Goal: to get stronger and get home PT Goal Formulation: With patient Time For Goal Achievement: 05/14/18 Potential to Achieve Goals: Good    Frequency Min 3X/week   Barriers to discharge Decreased caregiver support home alone at times    Co-evaluation               AM-PAC PT "6 Clicks" Daily Activity  Outcome Measure Difficulty turning over in bed (including adjusting bedclothes, sheets and blankets)?: A Little Difficulty moving from lying on back to sitting on the side of the bed? : A Little Difficulty sitting down on and standing up from a chair with arms (e.g., wheelchair, bedside commode, etc,.)?: A Little Help needed moving to and from a bed to chair (including a wheelchair)?: A Little Help needed walking in hospital room?: A Little Help needed climbing 3-5 steps with a railing? : A Lot 6 Click Score: 17    End of Session Equipment Utilized During Treatment: Gait belt Activity Tolerance: Patient tolerated treatment well Patient left: in bed;with call bell/phone within reach;with bed alarm set Nurse Communication: Mobility status PT Visit Diagnosis: Unsteadiness on feet (R26.81);Muscle weakness (generalized) (M62.81);Difficulty in walking, not elsewhere classified (R26.2)    Time: 4270-6237 PT Time Calculation (min) (ACUTE ONLY): 21 min   Charges:   PT Evaluation $PT Eval Moderate Complexity: 1 Mod         Ramond Dial 04/30/2018, 4:28 PM   Mee Hives, PT MS Acute Rehab  Dept. Number: [place] and [place]

## 2018-04-30 NOTE — Progress Notes (Addendum)
Pt ascites site leaking clear yellow fluid. Site unremarkable, no redness or signs of infection. Pt gown and bed pad wet. ABD placed and MD notifies. Pt stable.

## 2018-04-30 NOTE — Telephone Encounter (Signed)
-----   Message from Milus Banister, MD sent at 04/30/2018  1:47 PM EST ----- She will be leaving [facility] in 1-2 days. Needs OV with extender or Armbruster in 3-4 weeks. Needs labs a couple days prior (cbc, cmet, inr).  thanks

## 2018-04-30 NOTE — Telephone Encounter (Signed)
appt made with Dr Havery Moros and labs in [place]. Pt to be notified at discharge

## 2018-04-30 NOTE — Progress Notes (Addendum)
Daily Rounding Note  04/30/2018, 11:42 AM  LOS: 6 days   SUBJECTIVE:   Chief complaint: Variceal bleed, anemia, ascites, cirrhosis Patient has some abdominal discomfort in the upper abdomen.  It is mild.  Some occasional nausea but eating well, tolerating solids. 2 loose, brown stools yesterday.  One this morning. Has not noticed an increase in urine output.    OBJECTIVE:         Vital signs in last 24 hours:    Temp:  [97.9 F (36.6 C)-98.6 F (37 C)] 98.6 F (37 C) (11/08 0821) Pulse Rate:  [91] 91 (11/08 0821) Resp:  [13-19] 18 (11/08 0821) BP: (107-127)/(73-79) 107/73 (11/08 0821) SpO2:  [98 %] 98 % (11/08 0821) Weight:  [69.2 kg] 69.2 kg (11/08 0440) Last BM Date: 04/29/18 Filed Weights   04/27/18 0623 04/27/18 1559 04/30/18 0440  Weight: 68.7 kg 68.7 kg 69.2 kg   General: Sitting up in the chair at the bedside, looks better.  A little bit more engaged. Heart: Regular rate.  Slightly tachycardic. Chest: Clear bilaterally.  No cough or labored breathing. Abdomen: Soft but protuberant.  Not tender.  Active bowel sounds. Extremities: No CCE. Neuro/Psych: Alert.  Oriented x3.  No asterixis or tremor.  Intake/Output from previous day: 11/07 0701 - 11/08 0700 In: 717 [P.O.:480] Out: -   Intake/Output this shift: No intake/output data recorded.  Lab Results: Recent Labs    04/28/18 0332 04/29/18 0451 04/30/18 0250  WBC 3.9* 3.8* 6.8  HGB 8.9* 9.2* 8.9*  HCT 26.8* 27.7* 26.4*  PLT 64* 80* 76*   BMET Recent Labs    04/28/18 0332 04/29/18 0451 04/30/18 0250  NA 136 138 138  K 3.3* 3.4* 3.5  CL 107 109 109  CO2 23 21* 21*  GLUCOSE 115* 155* 170*  BUN 12 7 7   CREATININE 0.71 0.76 0.77  CALCIUM 8.2* 8.5* 8.7*   LFT Recent Labs    04/28/18 0332 04/29/18 0451 04/30/18 0250  PROT 5.6* 6.0* 5.6*  ALBUMIN 2.0* 1.8* 1.7*  AST 72* 74* 67*  ALT 30 37 35  ALKPHOS 127* 132* 112  BILITOT 9.2*  8.8* 6.9*   PT/INR Recent Labs    04/30/18 0250  LABPROT 23.3*  INR 2.10   Hepatitis Panel No results for input(s): HEPBSAG, HCVAB, HEPAIGM, HEPBIGM in the last 72 hours.  Studies/Results: No results found.   Scheduled Meds: . sodium chloride   Intravenous Once  . feeding supplement  1 Container Oral TID BM  . folic acid  1 mg Oral Daily  . furosemide  80 mg Oral Daily  . lactulose  30 g Oral Q3H  . [START ON 05/01/2018] lactulose  30 g Oral TID  . multivitamin with minerals  1 tablet Oral Daily  . nicotine  7 mg Transdermal Daily  . pantoprazole  40 mg Oral BID  . phytonadione  10 mg Subcutaneous Daily  . prednisoLONE  40 mg Oral Daily  . spironolactone  100 mg Oral BID  . thiamine  100 mg Oral Daily   Continuous Infusions: . cefTRIAXone (ROCEPHIN)  IV 1 g (04/30/18 0522)   PRN Meds:.LORazepam, morphine injection, ondansetron (ZOFRAN) IV   ASSESMENT:    *CG, dark emesis.  Esophageal variceal banding in Spring 2019.  EGD 04/25/18:Scarring from previous banding, no active bleeding or stigmata of bleeding.7 bands placed to large esophageal varices. Congested duodenal mucosa.  EGD 04/27/18: mild portal gastropathy Retained esophageal variceal bands,  4 additional bands placed. No fresh or old blood seen.  Finished 4 d Octreotide on 11/6.  Protonix 40 IV >> PO BID, Rocephin, .   * Blood loss anemia. S/p PRBC x 1 U 11/5. Hgb fluctuating but overall improved.    * ETOH cirrhosis, decompensated.ETOH hepatitis.   Continues to drink heavily at home, ETOH withdrawal observed a few days ago. Hep ABC negative.  MELD 23, MELD-Na 25. Discriminant fx score 62.  AFP 1.4.   Prednisolone started 11/6, day 3.  LFT's overall stable to improved.   * Large volume ascites, abdominal pain.  4 liter paracentesis op 11/4, no signs of SBP. Weight 70 >>68.7 kg >> 69.2 in last 4 days.  Aldactone 100 mg BID , Lasix 80 mg daily. No signs of renal dysfx.    *  Hepatic encephalopathy.  TID Lactulose in place.   * Nodular omentum on CT, concerning for carcinomatosis. No malignant cells on ascitic cytology study. Dr Ardis Hughs is not recommending further work up of this.    * Thrombocytopenia. improved, non-critical at 76.  * Coagulopathy. S/p FFP x 2 on 11/6. Daily x 3 days sq Vit K in place.    PLAN   *   Stop daily SQ vitamin K.  In 3 days there has been minor improvement in her PT, INR.  More doses of vitamin K or not going to remedy her coagulopathy which is driven by her cirrhosis though seated liver dysfunction.  *    Complete a 30-day course of prednisolone.   Stopping Rocephin after today, she has completed 7 days. Continue lactulose and current diuretics. Going to drop the Protonix to 40 mg daily  *   Within the next several weeks she is going to need repeat EGD for surveillance of esophageal varices and repeat banding if needed.  *   Reminded patient that she needs to have complete alcohol abstinence.      Katie Holland  04/30/2018, 11:42 AM Phone [phone]

## 2018-04-30 NOTE — Progress Notes (Signed)
PROGRESS NOTE   Katie Holland  DJS:[id number]    DOB: 1965/12/18    DOA: 04/24/2018  PCP: Billie Ruddy, MD   I have briefly reviewed patients previous medical records in [facility].  Brief Narrative:  52 year old female, lives alone, PMH of alcoholic cirrhosis complicated by esophageal varices, ascites, encephalopathy, coagulopathy, ongoing alcohol abuse, tobacco abuse, medication noncompliance, GERD, presented to [facility] ED 04/24/2018 due to progressive abdominal distention, pain, couple of episodes of coffee-ground emesis and dark stools.  Admitted for acute upper GI/variceal bleed, acute blood loss anemia, cirrhosis complicated by ascites and possible SBP.  [facility] consulted, status post EGD and variceal banding 11/3.  As well 11/3, consulted IR for ultrasound-guided diagnostic and therapeutic paracentesis on 11/4.  Subjective: Patient unable to provide any complaints, more awake today, was able to sit on the recliner per staff, she had multiple bowel movements today after using her lactulose dose. Assessment & Plan:   Principal Problem:   Bleeding esophageal varices (HCC) Active Problems:   Alcoholic cirrhosis of liver with ascites (HCC)   Abdominal pain   Hematemesis   Hematochezia   Alcohol abuse   Hyponatremia   Hypokalemia   Metabolic acidosis, normal anion gap (NAG)   Protein-calorie malnutrition, severe   Acute upper GI/variceal bleed:  -Most likely in the setting of esophageal variceal bleed, active bleed during endoscopy 04/25/2018, 04/27/2018, . Martin [facility]estic for the endoscopy 04/25/2018, noted for scarring from previous banding, no active bleeding or stigmata of bleeding, she had 7 bands placed 2 large esophageal varices, noted to have congested duodenal mucosa -Endoscopy 04/27/2018, significant for mild portal gastropathy, where for additional bands were placed, no fresh blood was noted. -Hemoglobin appears to be stable, no recurrence of GI bleed, Protonix drip has been  stopped, as well IV octreotide drip stopped 04/28/2018, continue with IV Rocephin .   Acute blood loss anemia complicating chronic anemia: -Secondary to GI bleed, most likely upper, variceal, so far required 2 units PRBC transfusion 04/27/2018. -Continue to monitor hemoglobin closely, it is 8.9 today, no indication for transfusion   alcoholic cirrhosis -Decompensated, with recurrent ascites, jaundice thrombocytopenia, hyperammonemia and coagulopathy -With elevated LFTs, AST/alk phos is improving, total bilirubin still elevated .  Please see discussion below regarding alcoholic hepatitis. -With known thrombocytopenia, on SCD for DVT prophylaxis, monitor platelet count closely . -With coagulopathy, on vitamin K, she received total of 3 subcu doses, INR remains elevated at 2.1 today, will give another 3 days -With recurrent ascites, large volume, with 4 L paracentesis 04/26/2018, no evidence of SBP , appears to be reaccumulating, she is on Aldactone and Lasix, will discuss with GI as appropriate to repeat paracentesis  Alcoholic hepatitis -Started on prednisolone, total bili is trending down  Hyperammonemia -Due to liver failure, ammonia level still elevated, but overall is trending down, mentation has improved, ammonia is back up to 60 today, I will give her 30 mils of lactulose every 3 hours total of 5 doses, and repeat ammonia in a.m. .  Encephalopathy - multifactorial, most likely toxic in the setting of alcohol withdrawals, and hepatic in the setting of hyperammonemia -More awake and conversant and appropriate today  Alcohol dependence:  - Patient understates the amount of alcohol she consumes.  Abstinence counseled. CIWA protocol.    Tobacco abuse: Cessation counseled.  Patient requests nicotine patch.  Abnormal CT abdomen/possible carcinomatosis: Ascitic fluid cytology 04/26/2018: No malignant cells identified.  Chronic hypotension: Likely related to her cirrhosis and third spacing.   Asymptomatic.  Avoid IV fluids.  Hypokalemia: Replaced.  Hypomagnesemia: Replaced.  Severe protein calorie malnutrition: Dietitian input appreciated.  GERD: PPI.  Generalized weakness and severe deconditioning -He is more awake and alert today, will try to get out of bed to chair and consult PT  DVT prophylaxis: SCDs. Code Status: Full. Family Communication: Discussed with daughter phone 04/29/2018 Disposition: We will consult PT  Consultants:  [facility].  Procedures:  EGD 04/25/2018: 04/27/2018 Paracentesis 2 units PRBC transfusion  Impression:  - Z-line regular. - Large esophageal varices with red markings. Banded x 7 with good result - Normal stomach. - Congested duodenal mucosa.  Recommendations: - Return patient to hospital ward for ongoing care. - NPO for now, clear liquids later today okay if stable - Continue present medications. - Full recommendations per consult note - GI service will continue to follow  Antimicrobials:  IV ceftriaxone 11/2 >     Objective:  Vitals:   04/29/18 2000 04/30/18 0000 04/30/18 0440 04/30/18 0821  BP:    107/73  Pulse:    91  Resp:    18  Temp: 98 F (36.7 C) 98.2 F (36.8 C)  98.6 F (37 C)  TempSrc: Oral Oral  Oral  SpO2:    98%  Weight:   69.2 kg   Height:        Examination:  More awake, alert and appropriate today, answering questions more appropriately, but still confused Symmetrical Chest wall movement, Good air movement bilaterally, CTAB RRR,No Gallops,Rubs or new Murmurs, No Parasternal Heave +ve B.Sounds, Abd Soft, No tenderness, ascites present no rebound - guarding or rigidity. No Cyanosis, Clubbing or edema, No new Rash or bruise     Data Reviewed: I have personally reviewed following labs and imaging studies  CBC: Recent Labs  Lab 04/24/18 1657  04/27/18 0316 04/28/18 0001 04/28/18 0332 04/29/18 0451 04/30/18 0250  WBC 6.1   < > 5.2 4.3 3.9* 3.8* 6.8  NEUTROABS 4.0  --   --   --   --    --   --   HGB 9.6*   < > 7.4* 9.4* 8.9* 9.2* 8.9*  HCT 28.2*   < > 22.3* 27.1* 26.8* 27.7* 26.4*  MCV 86.0   < > 87.1 85.0 85.4 85.2 86.3  PLT PLATELET CLUMPS NOTED ON SMEAR, COUNT APPEARS DECREASED   < > 75* 63* 64* 80* 76*   < > = values in this interval not displayed.   Basic Metabolic Panel: Recent Labs  Lab 04/24/18 2343  04/25/18 0946 04/26/18 0451 04/27/18 0316 04/28/18 0332 04/29/18 0451 04/30/18 0250  NA  --    < > 136 135 134* 136 138 138  K  --    < > 4.1 4.3 4.0 3.3* 3.4* 3.5  CL  --    < > 104 108 107 107 109 109  CO2  --    < > 20* 21* 21* 23 21* 21*  GLUCOSE  --    < > 83 93 83 115* 155* 170*  BUN  --    < > '7 14 16 12 7 7  '$ CREATININE  --    < > 0.98 1.10* 0.98 0.71 0.76 0.77  CALCIUM  --    < > 8.3* 8.1* 7.9* 8.2* 8.5* 8.7*  MG 1.6*  --  2.3 2.2  --   --   --   --    < > = values in this interval not displayed.   Liver Function Tests:  Recent Labs  Lab 04/25/18 0310 04/26/18 0451 04/28/18 0332 04/29/18 0451 04/30/18 0250  AST 66* 70* 72* 74* 67*  ALT '29 28 30 '$ 37 35  ALKPHOS 161* 147* 127* 132* 112  BILITOT 9.1* 9.2* 9.2* 8.8* 6.9*  PROT 6.3* 6.1* 5.6* 6.0* 5.6*  ALBUMIN 1.7* 1.7* 2.0* 1.8* 1.7*   Coagulation Profile: Recent Labs  Lab 04/24/18 1758 04/27/18 0316 04/30/18 0250  INR 2.40 2.11 2.10     Recent Results (from the past 240 hour(s))  MRSA PCR Screening     Status: None   Collection Time: 04/25/18  3:25 AM  Result Value Ref Range Status   MRSA by PCR NEGATIVE NEGATIVE Final    Comment:        The GeneXpert MRSA Assay (FDA approved for NASAL specimens only), is one component of a comprehensive MRSA colonization surveillance program. It is not intended to diagnose MRSA infection nor to guide or monitor treatment for MRSA infections. Performed at [facility], [place] [address] [place], [place] [address]   Gram stain     Status: None   Collection Time: 04/26/18  1:34 PM  Result Value Ref Range Status   Specimen  Description FLUID  Final   Special Requests NONE  Final   Gram Stain   Final    WBC PRESENT, PREDOMINANTLY MONONUCLEAR NO ORGANISMS SEEN CYTOSPIN SMEAR Performed at [facility], [place] [address] [place], [place] [address]    Report Status 04/26/2018 FINAL  Final  Culture, body fluid-bottle     Status: None (Preliminary result)   Collection Time: 04/26/18  3:30 PM  Result Value Ref Range Status   Specimen Description PERITONEAL CAVITY  Final   Special Requests NONE  Final   Culture   Final    NO GROWTH 3 DAYS Performed at [facility] [address], [place], [place] [address]    Report Status PENDING  Incomplete         Radiology Studies: No results found.      Scheduled Meds: . sodium chloride   Intravenous Once  . feeding supplement  1 Container Oral TID BM  . folic acid  1 mg Oral Daily  . furosemide  80 mg Oral Daily  . lactulose  30 g Oral Q3H  . [START ON 05/01/2018] lactulose  30 g Oral TID  . multivitamin with minerals  1 tablet Oral Daily  . nicotine  7 mg Transdermal Daily  . pantoprazole  40 mg Oral BID  . prednisoLONE  40 mg Oral Daily  . spironolactone  100 mg Oral BID  . thiamine  100 mg Oral Daily   Continuous Infusions: . cefTRIAXone (ROCEPHIN)  IV 1 g (04/30/18 0522)     LOS: 6 days     Phillips Climes, MD,. [facility] Pager [phone]  If 7PM-7AM, please contact night-coverage [email or web address] Password [facility] 04/30/2018, 9:26 AM

## 2018-05-01 LAB — COMPREHENSIVE METABOLIC PANEL
ALBUMIN: 1.6 g/dL — AB (ref 3.5–5.0)
ALT: 41 U/L (ref 0–44)
AST: 68 U/L — AB (ref 15–41)
Alkaline Phosphatase: 109 U/L (ref 38–126)
Anion gap: 7 (ref 5–15)
BUN: 6 mg/dL (ref 6–20)
CHLORIDE: 107 mmol/L (ref 98–111)
CO2: 23 mmol/L (ref 22–32)
Calcium: 8.4 mg/dL — ABNORMAL LOW (ref 8.9–10.3)
Creatinine, Ser: 0.74 mg/dL (ref 0.44–1.00)
GFR calc Af Amer: >60 mL/min (ref >60)
GFR calc non Af Amer: >60 mL/min (ref >60)
GLUCOSE: 132 mg/dL — AB (ref 70–99)
POTASSIUM: 3.3 mmol/L — AB (ref 3.5–5.1)
Sodium: 137 mmol/L (ref 135–145)
Total Bilirubin: 6.7 mg/dL — ABNORMAL HIGH (ref 0.3–1.2)
Total Protein: 5.6 g/dL — ABNORMAL LOW (ref 6.5–8.1)

## 2018-05-01 LAB — TYPE AND SCREEN
ABO/RH(D): O POS
ANTIBODY SCREEN: POSITIVE
DAT, IgG: NEGATIVE
Unit division: 0
Unit division: 0

## 2018-05-01 LAB — CULTURE, BODY FLUID W GRAM STAIN -BOTTLE: Culture: NO GROWTH

## 2018-05-01 LAB — BPAM RBC
BLOOD PRODUCT EXPIRATION DATE: 201912012359
BLOOD PRODUCT EXPIRATION DATE: 201912032359
ISSUE DATE / TIME: 201911051801
UNIT TYPE AND RH: 5100
Unit Type and Rh: 5100

## 2018-05-01 LAB — CBC
HEMATOCRIT: 26.3 % — AB (ref 36.0–46.0)
Hemoglobin: 9 g/dL — ABNORMAL LOW (ref 12.0–15.0)
MCH: 28.9 pg (ref 26.0–34.0)
MCHC: 34.2 g/dL (ref 30.0–36.0)
MCV: 84.6 fL (ref 80.0–100.0)
NRBC: 0 % (ref 0.0–0.2)
Platelets: 77 10*3/uL — ABNORMAL LOW (ref 150–400)
RBC: 3.11 MIL/uL — AB (ref 3.87–5.11)
RDW: 21.2 % — ABNORMAL HIGH (ref 11.5–15.5)
WBC: 6.6 10*3/uL (ref 4.0–10.5)

## 2018-05-01 LAB — AMMONIA: Ammonia: 48 umol/L — ABNORMAL HIGH (ref 9–35)

## 2018-05-01 MED ORDER — ALBUMIN HUMAN 25 % IV SOLN
12.5000 g | Freq: Once | INTRAVENOUS | Status: AC | PRN
  Administered 2018-05-02: 12.5 g via INTRAVENOUS
  Filled 2018-05-01: qty 50

## 2018-05-01 MED ORDER — LACTULOSE 10 GM/15ML PO SOLN
30.0000 g | Freq: Four times a day (QID) | ORAL | Status: DC
  Administered 2018-05-01: 30 g via ORAL
  Filled 2018-05-01: qty 45

## 2018-05-01 MED ORDER — LACTULOSE 10 GM/15ML PO SOLN
30.0000 g | ORAL | Status: DC
  Administered 2018-05-01 – 2018-05-02 (×6): 30 g via ORAL
  Filled 2018-05-01 (×5): qty 45

## 2018-05-01 NOTE — Progress Notes (Signed)
PROGRESS NOTE   Therma Lasure  NLZ:[id number]    DOB: 07/16/1965    DOA: 04/24/2018  PCP: Billie Ruddy, MD   I have briefly reviewed patients previous medical records in [facility].  Brief Narrative:  52 year old female, lives alone, PMH of alcoholic cirrhosis complicated by esophageal varices, ascites, encephalopathy, coagulopathy, ongoing alcohol abuse, tobacco abuse, medication noncompliance, GERD, presented to [facility] ED 04/24/2018 due to progressive abdominal distention, pain, couple of episodes of coffee-ground emesis and dark stools.  Admitted for acute upper GI/variceal bleed, acute blood loss anemia, cirrhosis complicated by ascites and possible SBP.  [facility] consulted, status post EGD and variceal banding 11/3.  As well 11/3, consulted IR for ultrasound-guided diagnostic and therapeutic paracentesis on 11/4.  Subjective: Patient is awake alert, pleasant and appropriate today, she denies any complaints, she only had 2 bowel movements yesterday. Assessment & Plan:   Principal Problem:   Bleeding esophageal varices (HCC) Active Problems:   Alcoholic cirrhosis of liver with ascites (HCC)   Abdominal pain   Hematemesis   Hematochezia   Alcohol abuse   Hyponatremia   Hypokalemia   Metabolic acidosis, normal anion gap (NAG)   Protein-calorie malnutrition, severe   Acute upper GI/variceal bleed:  -Most likely in the setting of esophageal variceal bleed, active bleed during endoscopy 04/25/2018, 04/27/2018, . [facility]estic for the endoscopy 04/25/2018, noted for scarring from previous banding, no active bleeding or stigmata of bleeding, she had 7 bands placed 2 large esophageal varices, noted to have congested duodenal mucosa -Endoscopy 04/27/2018, significant for mild portal gastropathy, where for additional bands were placed, no fresh blood was noted. -Hemoglobin appears to be stable, no recurrence of GI bleed, Protonix drip has been stopped, as well IV octreotide drip stopped 04/28/2018,  treated with IV Rocephin  Acute blood loss anemia complicating chronic anemia: -Secondary to GI bleed, most likely upper, variceal, so far required 2 units PRBC transfusion 04/27/2018. -Globin remained stable, continue to monitor closely   alcoholic cirrhosis -Decompensated, with recurrent ascites, jaundice thrombocytopenia, hyperammonemia and coagulopathy -With elevated LFTs, AST/alk phos is improving, total bilirubin still elevated .  Please see discussion below regarding alcoholic hepatitis. -With known thrombocytopenia, on SCD for DVT prophylaxis, monitor platelet count closely . -With coagulopathy, on vitamin K, she received total of 3 subcu doses, INR remains elevated at 2.1 today, will give another 3 days -With recurrent ascites, large volume, with 4 L paracentesis 04/26/2018, no evidence of SBP , appears to be reaccumulating, she is on Aldactone and Lasix, patient with recurrence of her ascites, I have requested therapeutic paracentesis, will give albumin with it giving her low albumin at 1.6  Alcoholic hepatitis -Started on prednisolone, total bili is trending down  Hyperammonemia -Due to liver failure, ammonia level still elevated, but overall is trending down, mentation has improved, when he has improved to 48 today, will continue with lactulose today as well, repeat ammonia in a.m. .  Encephalopathy - multifactorial, most likely toxic in the setting of alcohol withdrawals, and hepatic in the setting of hyperammonemia -Do think the most contributing factor is hyperammonemia, as she significantly improved today after improvement of her ammonia level  Alcohol dependence:  - Patient understates the amount of alcohol she consumes.  Abstinence counseled. CIWA protocol.  Will consult social worker  Tobacco abuse: Cessation counseled.  Patient requests nicotine patch.  Abnormal CT abdomen/possible carcinomatosis: Ascitic fluid cytology 04/26/2018: No malignant cells  identified.  Chronic hypotension: Likely related to her cirrhosis and third spacing.  Asymptomatic.  Avoid IV fluids.  Hypokalemia: Replaced.  Hypomagnesemia: Replaced.  Severe protein calorie malnutrition: Dietitian input appreciated.  GERD: PPI.  Generalized weakness and severe deconditioning -He is more awake and alert today, will try to get out of bed to chair and consult PT  DVT prophylaxis: SCDs. Code Status: Full. Family Communication: Discussed with daughter phone today Disposition: PT consulted  Consultants:  [facility].  Procedures:  EGD 04/25/2018: 04/27/2018 Paracentesis 2 units PRBC transfusion  Impression:  - Z-line regular. - Large esophageal varices with red markings. Banded x 7 with good result - Normal stomach. - Congested duodenal mucosa.  Recommendations: - Return patient to hospital ward for ongoing care. - NPO for now, clear liquids later today okay if stable - Continue present medications. - Full recommendations per consult note - GI service will continue to follow  Antimicrobials:  IV ceftriaxone 11/2 >     Objective:  Vitals:   05/01/18 0308 05/01/18 0334 05/01/18 0548 05/01/18 1500  BP: 101/71   113/76  Pulse: 91     Resp: 16     Temp:  98.3 F (36.8 C)  98.5 F (36.9 C)  TempSrc:  Oral  Oral  SpO2: 96%     Weight:   70.2 kg   Height:        Examination:  Awake Alert, Oriented X 3, No new F.N deficits, Normal affect, pleasant and appropriate, no asterixis today Symmetrical Chest wall movement, Good air movement bilaterally, CTAB RRR,No Gallops,Rubs or new Murmurs, No Parasternal Heave +ve B.Sounds, Abd Soft, No tenderness, ascites present, no rebound - guarding or rigidity. No Cyanosis, Clubbing or edema, No new Rash or bruise      Data Reviewed: I have personally reviewed following labs and imaging studies  CBC: Recent Labs  Lab 04/24/18 1657  04/28/18 0001 04/28/18 0332 04/29/18 0451 04/30/18 0250  05/01/18 0353  WBC 6.1   < > 4.3 3.9* 3.8* 6.8 6.6  NEUTROABS 4.0  --   --   --   --   --   --   HGB 9.6*   < > 9.4* 8.9* 9.2* 8.9* 9.0*  HCT 28.2*   < > 27.1* 26.8* 27.7* 26.4* 26.3*  MCV 86.0   < > 85.0 85.4 85.2 86.3 84.6  PLT PLATELET CLUMPS NOTED ON SMEAR, COUNT APPEARS DECREASED   < > 63* 64* 80* 76* 77*   < > = values in this interval not displayed.   Basic Metabolic Panel: Recent Labs  Lab 04/24/18 2343  04/25/18 0946 04/26/18 0451 04/27/18 0316 04/28/18 0332 04/29/18 0451 04/30/18 0250 05/01/18 0353  NA  --    < > 136 135 134* 136 138 138 137  K  --    < > 4.1 4.3 4.0 3.3* 3.4* 3.5 3.3*  CL  --    < > 104 108 107 107 109 109 107  CO2  --    < > 20* 21* 21* 23 21* 21* 23  GLUCOSE  --    < > 83 93 83 115* 155* 170* 132*  BUN  --    < > _0 CREATININE  --    < > 0.98 1.10* 0.98 0.71 0.76 0.77 0.74  CALCIUM  --    < > 8.3* 8.1* 7.9* 8.2* 8.5* 8.7* 8.4*  MG 1.6*  --  2.3 2.2  --   --   --   --   --    < > =  values in this interval not displayed.   Liver Function Tests: Recent Labs  Lab 04/26/18 0451 04/28/18 0332 04/29/18 0451 04/30/18 0250 05/01/18 0353  AST 70* 72* 74* 67* 68*  ALT 28 30 37 35 41  ALKPHOS 147* 127* 132* 112 109  BILITOT 9.2* 9.2* 8.8* 6.9* 6.7*  PROT 6.1* 5.6* 6.0* 5.6* 5.6*  ALBUMIN 1.7* 2.0* 1.8* 1.7* 1.6*   Coagulation Profile: Recent Labs  Lab 04/24/18 1758 04/27/18 0316 04/30/18 0250  INR 2.40 2.11 2.10     Recent Results (from the past 240 hour(s))  MRSA PCR Screening     Status: None   Collection Time: 04/25/18  3:25 AM  Result Value Ref Range Status   MRSA by PCR NEGATIVE NEGATIVE Final    Comment:        The GeneXpert MRSA Assay (FDA approved for NASAL specimens only), is one component of a comprehensive MRSA colonization surveillance program. It is not intended to diagnose MRSA infection nor to guide or monitor treatment for MRSA infections. Performed at [facility], [place] [address],  [place], [place] [address]   Gram stain     Status: None   Collection Time: 04/26/18  1:34 PM  Result Value Ref Range Status   Specimen Description FLUID  Final   Special Requests NONE  Final   Gram Stain   Final    WBC PRESENT, PREDOMINANTLY MONONUCLEAR NO ORGANISMS SEEN CYTOSPIN SMEAR Performed at [facility], [place] [address], [place], [place] [address]    Report Status 04/26/2018 FINAL  Final  Culture, body fluid-bottle     Status: None (Preliminary result)   Collection Time: 04/26/18  3:30 PM  Result Value Ref Range Status   Specimen Description PERITONEAL CAVITY  Final   Special Requests NONE  Final   Culture   Final    NO GROWTH 4 DAYS Performed at [facility] [address] [place], [place] [address]    Report Status PENDING  Incomplete         Radiology Studies: No results found.      Scheduled Meds: . sodium chloride   Intravenous Once  . feeding supplement  1 Container Oral TID BM  . folic acid  1 mg Oral Daily  . furosemide  80 mg Oral Daily  . lactulose  30 g Oral Q4H  . multivitamin with minerals  1 tablet Oral Daily  . nicotine  7 mg Transdermal Daily  . pantoprazole  40 mg Oral Daily  . prednisoLONE  40 mg Oral Daily  . spironolactone  100 mg Oral BID  . thiamine  100 mg Oral Daily   Continuous Infusions: . albumin human       LOS: 7 days     Phillips Climes, MD,. [facility] Pager [phone]  If 7PM-7AM, please contact night-coverage [email or web address] Password [email or web address] 05/01/2018, 3:40 PM

## 2018-05-02 ENCOUNTER — Inpatient Hospital Stay (HOSPITAL_COMMUNITY): Payer: Medicare Other

## 2018-05-02 LAB — COMPREHENSIVE METABOLIC PANEL
ALT: 42 U/L (ref 0–44)
ANION GAP: 7 (ref 5–15)
AST: 64 U/L — ABNORMAL HIGH (ref 15–41)
Albumin: 1.5 g/dL — ABNORMAL LOW (ref 3.5–5.0)
Alkaline Phosphatase: 105 U/L (ref 38–126)
BUN: 7 mg/dL (ref 6–20)
CHLORIDE: 103 mmol/L (ref 98–111)
CO2: 23 mmol/L (ref 22–32)
Calcium: 8.5 mg/dL — ABNORMAL LOW (ref 8.9–10.3)
Creatinine, Ser: 0.8 mg/dL (ref 0.44–1.00)
Glucose, Bld: 166 mg/dL — ABNORMAL HIGH (ref 70–99)
POTASSIUM: 3.1 mmol/L — AB (ref 3.5–5.1)
Sodium: 133 mmol/L — ABNORMAL LOW (ref 135–145)
Total Bilirubin: 6.2 mg/dL — ABNORMAL HIGH (ref 0.3–1.2)
Total Protein: 5.2 g/dL — ABNORMAL LOW (ref 6.5–8.1)

## 2018-05-02 LAB — CBC
HCT: 25.5 % — ABNORMAL LOW (ref 36.0–46.0)
HEMOGLOBIN: 8.8 g/dL — AB (ref 12.0–15.0)
MCH: 29 pg (ref 26.0–34.0)
MCHC: 34.5 g/dL (ref 30.0–36.0)
MCV: 84.2 fL (ref 80.0–100.0)
PLATELETS: 70 10*3/uL — AB (ref 150–400)
RBC: 3.03 MIL/uL — ABNORMAL LOW (ref 3.87–5.11)
RDW: 20.5 % — AB (ref 11.5–15.5)
WBC: 5.9 10*3/uL (ref 4.0–10.5)
nRBC: 0.5 % — ABNORMAL HIGH (ref 0.0–0.2)

## 2018-05-02 LAB — AMMONIA: AMMONIA: 52 umol/L — AB (ref 9–35)

## 2018-05-02 MED ORDER — LIDOCAINE HCL (PF) 1 % IJ SOLN
INTRAMUSCULAR | Status: AC
  Filled 2018-05-02: qty 30

## 2018-05-02 MED ORDER — LACTULOSE 10 GM/15ML PO SOLN
30.0000 g | Freq: Four times a day (QID) | ORAL | Status: DC
  Administered 2018-05-02 – 2018-05-03 (×4): 30 g via ORAL
  Filled 2018-05-02 (×4): qty 45

## 2018-05-02 MED ORDER — ALBUMIN HUMAN 25 % IV SOLN
12.5000 g | Freq: Once | INTRAVENOUS | Status: AC
  Administered 2018-05-02: 12.5 g via INTRAVENOUS
  Filled 2018-05-02: qty 50

## 2018-05-02 MED ORDER — POTASSIUM CHLORIDE CRYS ER 20 MEQ PO TBCR
40.0000 meq | EXTENDED_RELEASE_TABLET | ORAL | Status: AC
  Administered 2018-05-02 (×3): 40 meq via ORAL
  Filled 2018-05-02 (×3): qty 2

## 2018-05-02 NOTE — Procedures (Signed)
PROCEDURE SUMMARY:  Successful image-guided paracentesis from the left lower abdomen.  Yielded 3.6 liters of clear yellow fluid.  No immediate complications.  Patient tolerated well.   Specimen was not sent for labs.  Joaquim Nam PA-C 05/02/2018 10:32 AM

## 2018-05-02 NOTE — Progress Notes (Signed)
PROGRESS NOTE   Katie Holland  ZHY:[id number]    DOB: 1965-10-17    DOA: 04/24/2018  PCP: Billie Ruddy, MD   I have briefly reviewed patients previous medical records in [facility].  Brief Narrative:  52 year old female, lives alone, PMH of alcoholic cirrhosis complicated by esophageal varices, ascites, encephalopathy, coagulopathy, ongoing alcohol abuse, tobacco abuse, medication noncompliance, GERD, presented to [facility] ED 04/24/2018 due to progressive abdominal distention, pain, couple of episodes of coffee-ground emesis and dark stools.  Admitted for acute upper GI/variceal bleed, acute blood loss anemia, cirrhosis complicated by ascites and possible SBP.   GI consulted, status post EGD and variceal banding 11/3.  As well 11/3, consulted IR for ultrasound-guided diagnostic and therapeutic paracentesis on 11/4.  Subjective: Patient is awake alert, pleasant and appropriate today, she denies any complaints, she only had 2 bowel movements yesterday. Assessment & Plan:   Principal Problem:   Bleeding esophageal varices (HCC) Active Problems:   Alcoholic cirrhosis of liver with ascites (HCC)   Abdominal pain   Hematemesis   Hematochezia   Alcohol abuse   Hyponatremia   Hypokalemia   Metabolic acidosis, normal anion gap (NAG)   Protein-calorie malnutrition, severe   Acute upper GI/variceal bleed:  -Most likely in the setting of esophageal variceal bleed, active bleed during endoscopy 04/25/2018, 04/27/2018, . Martin Majestic for the endoscopy 04/25/2018, noted for scarring from previous banding, no active bleeding or stigmata of bleeding, she had 7 bands placed 2 large esophageal varices, noted to have congested duodenal mucosa -Endoscopy 04/27/2018, significant for mild portal gastropathy, where for additional bands were placed, no fresh blood was noted. -Hemoglobin appears to be stable, no recurrence of GI bleed, Protonix drip has been stopped, as well IV octreotide drip stopped 04/28/2018,  treated with IV Rocephin as well  Acute blood loss anemia complicating chronic anemia: -Secondary to GI bleed, most likely upper, variceal, so far required 2 units PRBC transfusion 04/27/2018. -Hemoglobin remained stable, continue to monitor closely   alcoholic cirrhosis -Decompensated, with recurrent ascites, jaundice thrombocytopenia, hyperammonemia and coagulopathy -With elevated LFTs, AST/alk phos is improving, total bilirubin still elevated .  Please see discussion below regarding alcoholic hepatitis. -With known thrombocytopenia, on SCD for DVT prophylaxis, monitor platelet count closely . -With coagulopathy, on vitamin K, she received total of 3 subcu doses, INR remains elevated at 2.1 today, will give another 3 days -With recurrent ascites, large volume, with 4 L paracentesis 04/26/2018, no evidence of SBP , appears to be reaccumulating, he went for another paracentesis today with 3.6 L drained, will give albumin x2. - 52 is on Aldactone and Lasix,  Alcoholic hepatitis -Started on prednisolone, lefty/total bilirubin trending down  Hyperammonemia -Due to liver failure, ammonia level still elevated, but overall is trending down, continue with lactulose every 4 hour today as well as her ammonia level still elevated at 52   Encephalopathy - multifactorial, most likely toxic in the setting of alcohol withdrawals, and hepatic in the setting of hyperammonemia -Do think the most contributing factor is hyperammonemia, as she significantly improved today after improvement of her ammonia level  Alcohol dependence:  - Patient understates the amount of alcohol she consumes.  Abstinence counseled. CIWA protocol.  Will consult social worker  Tobacco abuse: Cessation counseled.  Patient requests nicotine patch.  Abnormal CT abdomen/possible carcinomatosis: Ascitic fluid cytology 04/26/2018: No malignant cells identified.  Chronic hypotension: Likely related to her cirrhosis and third spacing.   Asymptomatic.  Avoid IV fluids.  Hypokalemia: Replaced.  Hypomagnesemia:  Replaced.  Severe protein calorie malnutrition: Dietitian input appreciated.  GERD: PPI.  Generalized weakness and severe deconditioning -He is more awake and alert today, will try to get out of bed to chair and consult PT  DVT prophylaxis: SCDs. Code Status: Full. Family Communication: Discussed with daughter phone 05/01/2018 Disposition: PT consulted  Consultants:  [facility].  Procedures:  EGD 04/25/2018: 04/27/2018 Paracentesis on 04/26/2018 with 4 L removed, and paracentesis on 05/02/2018 with 3.6 L removed 2 units PRBC transfusion  Impression:  - Z-line regular. - Large esophageal varices with red markings. Banded x 7 with good result - Normal stomach. - Congested duodenal mucosa.  Recommendations: - Return patient to hospital ward for ongoing care. - NPO for now, clear liquids later today okay if stable - Continue present medications. - Full recommendations per consult note - GI service will continue to follow  Antimicrobials:  IV ceftriaxone 11/2 >     Objective:  Vitals:   05/02/18 1018 05/02/18 1026 05/02/18 1028 05/02/18 1038  BP: 114/75 110/72 107/71 106/69  Pulse:      Resp:      Temp:      TempSrc:      SpO2:      Weight:      Height:        Examination:  Awake Alert, Oriented X 3, No new F.N deficits, Normal affect, no asterixis Symmetrical Chest wall movement, Good air movement bilaterally, CTAB RRR,No Gallops,Rubs or new Murmurs, No Parasternal Heave +ve B.Sounds, Abd Soft, No tenderness, No rebound - guarding or rigidity.  Ascites(seen before paracentesis done today) No Cyanosis, Clubbing or edema, No new Rash or bruise    Data Reviewed: I have personally reviewed following labs and imaging studies  CBC: Recent Labs  Lab 04/28/18 0332 04/29/18 0451 04/30/18 0250 05/01/18 0353 05/02/18 0440  WBC 3.9* 3.8* 6.8 6.6 5.9  HGB 8.9* 9.2* 8.9* 9.0* 8.8*  HCT  26.8* 27.7* 26.4* 26.3* 25.5*  MCV 85.4 85.2 86.3 84.6 84.2  PLT 64* 80* 76* 77* 70*   Basic Metabolic Panel: Recent Labs  Lab 04/26/18 0451  04/28/18 0332 04/29/18 0451 04/30/18 0250 05/01/18 0353 05/02/18 0440  NA 135   < > 136 138 138 137 133*  K 4.3   < > 3.3* 3.4* 3.5 3.3* 3.1*  CL 108   < > 107 109 109 107 103  CO2 21*   < > 23 21* 21* 23 23  GLUCOSE 93   < > 115* 155* 170* 132* 166*  BUN 14   < > '12 7 7 6 7  '$ CREATININE 1.10*   < > 0.71 0.76 0.77 0.74 0.80  CALCIUM 8.1*   < > 8.2* 8.5* 8.7* 8.4* 8.5*  MG 2.2  --   --   --   --   --   --    < > = values in this interval not displayed.   Liver Function Tests: Recent Labs  Lab 04/28/18 0332 04/29/18 0451 04/30/18 0250 05/01/18 0353 05/02/18 0440  AST 72* 74* 67* 68* 64*  ALT 30 37 35 41 42  ALKPHOS 127* 132* 112 109 105  BILITOT 9.2* 8.8* 6.9* 6.7* 6.2*  PROT 5.6* 6.0* 5.6* 5.6* 5.2*  ALBUMIN 2.0* 1.8* 1.7* 1.6* 1.5*   Coagulation Profile: Recent Labs  Lab 04/27/18 0316 04/30/18 0250  INR 2.11 2.10     Recent Results (from the past 240 hour(s))  MRSA PCR Screening     Status: None  Collection Time: 04/25/18  3:25 AM  Result Value Ref Range Status   MRSA by PCR NEGATIVE NEGATIVE Final    Comment:        The GeneXpert MRSA Assay (FDA approved for NASAL specimens only), is one component of a comprehensive MRSA colonization surveillance program. It is not intended to diagnose MRSA infection nor to guide or monitor treatment for MRSA infections. Performed at [facility], [place] [address], [place], [place] [address]   Gram stain     Status: None   Collection Time: 04/26/18  1:34 PM  Result Value Ref Range Status   Specimen Description FLUID  Final   Special Requests NONE  Final   Gram Stain   Final    WBC PRESENT, PREDOMINANTLY MONONUCLEAR NO ORGANISMS SEEN CYTOSPIN SMEAR Performed at [facility], [place] [address] [place], [place] [address]    Report Status 04/26/2018 FINAL  Final    Culture, body fluid-bottle     Status: None   Collection Time: 04/26/18  3:30 PM  Result Value Ref Range Status   Specimen Description PERITONEAL CAVITY  Final   Special Requests NONE  Final   Culture   Final    NO GROWTH 5 DAYS Performed at [facility] [address], [place], [place] [address]    Report Status 05/01/2018 FINAL  Final         Radiology Studies: US Paracentesis  Result Date: 05/02/2018 INDICATION: History of ETOH cirrhosis with recurrent ascites, esophageal varices, GI bleed and encephalopathy. Request for therapeutic paracentesis today. EXAM: ULTRASOUND GUIDED THERAPEUTIC PARACENTESIS MEDICATIONS: 10 mL 1% lidocaine COMPLICATIONS: None immediate. PROCEDURE: Informed written consent was obtained from the patient after a discussion of the risks, benefits and alternatives to treatment. A timeout was performed prior to the initiation of the procedure. Initial ultrasound scanning demonstrates a large amount of ascites within the left lower abdominal quadrant. The left lower abdomen was prepped and draped in the usual sterile fashion. 1% lidocaine was used for local anesthesia. Following this, a 19 gauge, 7-cm, Yueh catheter was introduced. An ultrasound image was saved for documentation purposes. The paracentesis was performed. The catheter was removed and a dressing was applied. The patient tolerated the procedure well without immediate post procedural complication. FINDINGS: A total of approximately 3.6 L of clear yellow fluid was removed. IMPRESSION: Successful ultrasound-guided paracentesis yielding 3.6 liters of peritoneal fluid. Read by Candiss Norse, PA-C Electronically Signed   By: Jacqulynn Cadet M.D.   On: 05/02/2018 11:08        Scheduled Meds: . sodium chloride   Intravenous Once  . feeding supplement  1 Container Oral TID BM  . folic acid  1 mg Oral Daily  . furosemide  80 mg Oral Daily  . lactulose  30 g Oral QID  . multivitamin with  minerals  1 tablet Oral Daily  . nicotine  7 mg Transdermal Daily  . pantoprazole  40 mg Oral Daily  . potassium chloride  40 mEq Oral Q4H  . prednisoLONE  40 mg Oral Daily  . spironolactone  100 mg Oral BID  . thiamine  100 mg Oral Daily   Continuous Infusions: . albumin human       LOS: 8 days     Phillips Climes, MD,. [facility] Pager [phone]  If 7PM-7AM, please contact night-coverage [email or web address] Password [email or web address] 05/02/2018, 1:18 PM

## 2018-05-03 MED ORDER — FUROSEMIDE 80 MG PO TABS: 80.0000 mg | ORAL_TABLET | Freq: Every day | ORAL | 0 refills | Status: DC

## 2018-05-03 MED ORDER — ADULT MULTIVITAMIN W/MINERALS CH: 1.0000 | ORAL_TABLET | Freq: Every day | ORAL | 0 refills | Status: DC

## 2018-05-03 MED ORDER — NADOLOL 20 MG PO TABS: 20.0000 mg | ORAL_TABLET | Freq: Every day | ORAL | 0 refills | Status: DC

## 2018-05-03 MED ORDER — SPIRONOLACTONE 100 MG PO TABS: 100.0000 mg | ORAL_TABLET | Freq: Two times a day (BID) | ORAL | 0 refills | Status: DC

## 2018-05-03 MED ORDER — NADOLOL 20 MG PO TABS
20.0000 mg | ORAL_TABLET | Freq: Every day | ORAL | Status: DC
  Filled 2018-05-03 (×2): qty 1

## 2018-05-03 MED ORDER — PREDNISOLONE 5 MG PO TABS: 40.0000 mg | ORAL_TABLET | Freq: Every day | ORAL | 0 refills | Status: DC

## 2018-05-03 MED ORDER — FOLIC ACID 1 MG PO TABS: 1.0000 mg | ORAL_TABLET | Freq: Every day | ORAL | 0 refills | Status: DC

## 2018-05-03 MED ORDER — POTASSIUM CHLORIDE ER 10 MEQ PO TBCR: 10.0000 meq | EXTENDED_RELEASE_TABLET | Freq: Every day | ORAL | 0 refills | Status: DC

## 2018-05-03 MED ORDER — THIAMINE HCL 100 MG PO TABS: 100.0000 mg | ORAL_TABLET | Freq: Every day | ORAL | 0 refills | Status: DC

## 2018-05-03 MED ORDER — LACTULOSE 20 GM/30ML PO SOLN: 45.0000 mL | Freq: Three times a day (TID) | ORAL | 3 refills | Status: DC

## 2018-05-03 MED ORDER — FERROUS SULFATE 325 (65 FE) MG PO TBEC: 325.0000 mg | DELAYED_RELEASE_TABLET | Freq: Two times a day (BID) | ORAL | 0 refills | Status: DC

## 2018-05-03 NOTE — Progress Notes (Signed)
CSW received referral regarding ETOH resources. CSW spoke with patient. She reports that this hospital admission scared her and she really wants to stop drinking alcohol. She states that she will be staying with her daughter and son in law for a little while while she is recovering. She reports that she is very motivated to get better and be there fore her family. She accepted resources and thanked CSW.   CSW signing off.   Percell Locus Trenita Hulme LCSW [phone]

## 2018-05-03 NOTE — Care Management Note (Addendum)
Case Management Note  Patient Details  Name: Katie Holland MRN: [id number] Date of Birth: 26-Feb-1966  Subjective/Objective:         Bleeding esophageal varices. From home alone. Independent with ADL's PTA, no DME usage.  Kathrin Greathouse (Daughter)     [phone]      PCP: Grier Mitts  Action/Plan: Transition to home today with home health services to follow.  Rolling walker will be delivered to bedside prior to d/c. Pt states has transportation to home.  Expected Discharge Date:  05/03/18               Expected Discharge Plan:  [place]  In-House Referral:  Clinical Social Work  Discharge planning Services  CM Consult  Post Acute Care Choice:    Choice offered to:  Patient  DME Arranged:  Walker rolling DME Agency:  [place]:  PT, RN, Nurse's Aide, Social Work [employer] Agency:  [place]  Status of Service:  Completed, signed off  If discussed at [employer], dates discussed:    Additional Comments:  Sharin Mons, RN 05/03/2018, 9:53 AM

## 2018-05-03 NOTE — Progress Notes (Signed)
Leane Para to be D/C'd to home per MD order. Discussed with the patient and all questions fully answered.   VVS, Skin clean, dry and intact without evidence of skin break down, no evidence of skin tears noted.  IV catheter discontinued intact. Site without signs and symptoms of complications. Dressing and pressure applied.  An After Visit Summary was printed and given to the patient.  Patient escorted via [place], and D/C home via private auto.  Tama High  05/03/2018 1:06 PM

## 2018-05-03 NOTE — Progress Notes (Signed)
Pt requested morphine but BP 97/80 and MD said to hold. 1mg  wasted in controlled substance container with [place]isa.

## 2018-05-03 NOTE — Discharge Summary (Signed)
Katie Holland, is a 52 y.o. female  DOB Jun 14, 1966  MRN [id number].  Admission date:  04/24/2018  Admitting Physician  Shela Leff, MD  Discharge Date:  05/03/2018   Primary MD  Billie Ruddy, MD  Recommendations for primary care physician for things to follow:  -Check CBC, CMP, ammonia level as an outpatient, -continue counseling about alcohol cessation  Admission Diagnosis  Abdominal pain [R10.9]   Discharge Diagnosis  Abdominal pain [R10.9]   Principal Problem:   Bleeding esophageal varices ([place]) Active Problems:   Alcoholic cirrhosis of liver with ascites ([facility])   Abdominal pain   Hematemesis   Hematochezia   Alcohol abuse   Hyponatremia   Hypokalemia   Metabolic acidosis, normal anion gap (NAG)   Protein-calorie malnutrition, severe      Past Medical History:  Diagnosis Date  . Alcoholic liver disease ([place])   . Chronic back pain   . Cirrhosis of liver ([place])   . GERD (gastroesophageal reflux disease)   . Jaundice     Past Surgical History:  Procedure Laterality Date  . BACK SURGERY    . ESOPHAGEAL BANDING  04/25/2018   Procedure: ESOPHAGEAL BANDING;  Surgeon: Yetta Flock, MD;  Location: [facility] ENDOSCOPY;  Service: Gastroenterology;;  . ESOPHAGEAL BANDING  04/27/2018   Procedure: ESOPHAGEAL BANDING;  Surgeon: Milus Banister, MD;  Location: [facility] ENDOSCOPY;  Service: Endoscopy;;  . ESOPHAGOGASTRODUODENOSCOPY N/A 04/27/2018   Procedure: ESOPHAGOGASTRODUODENOSCOPY (EGD);  Surgeon: Milus Banister, MD;  Location: [facility] ENDOSCOPY;  Service: Endoscopy;  Laterality: N/A;  . ESOPHAGOGASTRODUODENOSCOPY (EGD) WITH PROPOFOL N/A 04/25/2018   Procedure: ESOPHAGOGASTRODUODENOSCOPY (EGD) WITH PROPOFOL;  Surgeon: Yetta Flock, MD;  Location: [place];  Service: Gastroenterology;  Laterality: N/A;  . IR PARACENTESIS  04/26/2018       History of present illness and  Hospital  Course:     Kindly see H&P for history of present illness and admission details, please review complete Labs, Consult reports and Test reports for all details in brief  HPI  from the history and physical done on the day of admission 04/24/2018   HPI: Katie Holland is a 52 y.o. female with medical history significant of alcoholic liver cirrhosis, GERD presenting to the hospital for evaluation of abdominal pain and blood in vomit and stool.  Patient reports having severe right upper quadrant abdominal pain and abdominal distention for the past 2 days.  Reports having 2 episodes of nonbilious vomit with streaks of blood and 2 episodes of diarrhea mixed with blood in the past 2 days.  Also reports having chills.  States her appetite is okay.  States she was diagnosed with alcoholic liver cirrhosis during a previous hospitalization in [place] in March 2019.  Discharge document from the hospital showing that the patient was diagnosed with esophageal varices at that time.  She reports drinking alcohol for the past 15 years.  She drinks up to a sixpack of beer twice a week.  Her last drink was yesterday.  ED Course: Tachycardic (pulse in  100s to 120s).  Not hypotensive.  Hemoglobin 9.6, was 11.2 six months ago (no recent baseline in chart).  No platelet count reported due to clumping on smear. AST 80, ALT 32, alk phos 184, and T bili 8.9.  Lipase normal.  INR 2.4.  CT abdomen pelvis without contrast pending.  ED physician discussed the case with Dr. Luan Pulling from gastroenterology, recommendation is to get octreotide and IV vitamin K 10 mg.  GI will see the patient in the morning. [facility] paged to admit.    Hospital Course  52 year old female, lives alone, PMH of alcoholic cirrhosis complicated by esophageal varices, ascites, encephalopathy, coagulopathy, ongoing alcohol abuse, tobacco abuse, medication noncompliance, GERD, presented to [facility] ED 04/24/2018 due to progressive abdominal distention, pain, couple of  episodes of coffee-ground emesis and dark stools.  Admitted for acute upper GI/variceal bleed, acute blood loss anemia, cirrhosis complicated by ascites and possible SBP.  [facility] consulted, status post EGD and variceal banding 11/3.  As well 11/3, consulted IR for ultrasound-guided diagnostic and therapeutic paracentesis on 11/4. As well Therapeutic paracentesis 05/02/2018    Acute upper GI/variceal bleed:  -Most likely in the setting of esophageal variceal bleed, active bleed during endoscopy 04/25/2018, 04/27/2018, . Martin Majestic for the endoscopy 04/25/2018, noted for scarring from previous banding, no active bleeding or stigmata of bleeding, she had 7 bands placed 2 large esophageal varices, noted to have congested duodenal mucosa -Endoscopy 04/27/2018, significant for mild portal gastropathy, where for additional bands were placed, no fresh blood was noted. -Hemoglobin appears to be stable, no recurrence of GI bleed, Protonix drip has been stopped, as well IV octreotide drip stopped 04/28/2018, treated with IV Rocephin as well -Will be discharged on Protonix, the pressure is soft, there is no room to start beta-blockers  Acute blood loss anemia complicating chronic anemia: -Secondary to GI bleed, most likely upper, variceal, so far required 2 units PRBC transfusion 04/27/2018. -Hemoglobin remained stable, continue to monitor closely, started on iron supplements on discharge   alcoholic cirrhosis -Decompensated, with recurrent ascites, jaundice thrombocytopenia, hyperammonemia and coagulopathy -With elevated LFTs, AST/alk phos is improving, total bilirubin still elevated .  Please see discussion below regarding alcoholic hepatitis. -With known thrombocytopenia, on SCD for DVT prophylaxis, monitor platelet count closely . -With coagulopathy, he received vitamin K during hospital stay -With recurrent ascites, large volume, with 4 L paracentesis 04/26/2018, no evidence of SBP , appears to be  reaccumulating, he went for another paracentesis today with 3.6 L drained 05/02/2018, will give albumin x2. - she is on Aldactone and Lasix,  Alcoholic hepatitis -Started on prednisolone, lefty/total bilirubin trending down continue with prednisone 40 mg oral daily for 3 days, she will be seen by GI by then regarding further recommendation  Hyperammonemia -Due to liver failure, ammonia level trending down, her lactulose has been increased on discharge   Encephalopathy - multifactorial, most likely toxic in the setting of alcohol withdrawals, and hepatic in the setting of hyperammonemia -Do think the most contributing factor is hyperammonemia, as she significantly improved today after improvement of her ammonia level  Alcohol dependence:  - Patient understates the amount of alcohol she consumes.  Abstinence counseled. CIWA protocol during hospital stay,.    Social worker consulted  Tobacco abuse: Cessation counseled.  Patient requests nicotine patch.  Abnormal CT abdomen/possible carcinomatosis: Ascitic fluid cytology 04/26/2018: No malignant cells identified.  Chronic hypotension: Likely related to her cirrhosis and third spacing.  Asymptomatic.  Avoid IV fluids.  Hypokalemia: Replaced.  Hypomagnesemia: Replaced.  Severe protein calorie malnutrition: Dietitian input appreciated.  GERD: PPI.  Generalized weakness and severe deconditioning -home health has been arranged   Discharge Condition:   Stable Follow UP  Follow-up Information    Billie Ruddy, MD Follow up in 1 week(s).   Specialty:  Family Medicine Contact information: [place] [address] [phone]        Yetta Flock, MD Follow up.   Specialty:  Gastroenterology Why:  you will be called with appointment with hime, or one of his assistants Contact information: [place] [address] [phone]        [facility] -  Dme Follow up.   Contact information: [place] [address] [place], [facility] Follow up.   Specialty:  Home Health Services Why:  home health services arranged Contact information: [address] [place] [address] [phone]             Discharge Instructions  and  Discharge Medications    Discharge Instructions    Discharge instructions   Complete by:  As directed    GI bleed, liver cirrhosis   Increase activity slowly   Complete by:  As directed      Allergies as of 05/03/2018      Reactions   Contrast Media [iodinated Diagnostic Agents] Itching, Swelling   Latex Itching, Swelling   No breathing impairment, however   Sulfa Antibiotics Itching, Swelling   No breathing impairment, however      Medication List    STOP taking these medications   acetaminophen 325 MG tablet Commonly known as:  TYLENOL     TAKE these medications   ferrous sulfate 325 (65 FE) MG EC tablet Take 1 tablet (325 mg total) by mouth 2 (two) times daily.   folic acid 1 MG tablet Commonly known as:  FOLVITE Take 1 tablet (1 mg total) by mouth daily.   furosemide 80 MG tablet Commonly known as:  LASIX Take 1 tablet (80 mg total) by mouth daily.   Lactulose 20 GM/30ML Soln Take 45 mLs (30 g total) by mouth 3 (three) times daily. Take 30 mls every eight hours. Titrate for 3 bm per 24 hrs What changed:    how much to take  when to take this   multivitamin with minerals Tabs tablet Take 1 tablet by mouth daily.   pantoprazole 40 MG tablet Commonly known as:  PROTONIX Take 1 tablet (40 mg total) by mouth daily.   potassium chloride 10 MEQ tablet Commonly known as:  K-DUR Take 1 tablet (10 mEq total) by mouth daily.   prednisoLONE 5 MG Tabs tablet Take 8 tablets (40 mg total) by mouth daily.   spironolactone 100 MG tablet Commonly known as:  ALDACTONE Take 1 tablet (100 mg total) by mouth 2 (two) times  daily.   thiamine 100 MG tablet Take 1 tablet (100 mg total) by mouth daily.            Durable Medical Equipment  (From admission, onward)         Start     Ordered   05/03/18 1001  For home use only DME Walker rolling  Once    Question:  Patient needs a walker to treat with the following condition  Answer:  Weakness   05/03/18 1001  Diet and Activity recommendation: See Discharge Instructions above   Consults obtained -  Gastroenterology   Major procedures and Radiology Reports - PLEASE review detailed and final reports for all details, in brief -   - EGD 04/25/2018 and 04/27/2018 - Paracentesis on 04/26/2018 with 4 L removed, and paracentesis on 05/02/2018 with 3.6 L removed - 2 units PRBC transfusion   Ct Abdomen Pelvis Wo Contrast  Result Date: 04/25/2018 CLINICAL DATA:  Abdominal pain, hematemesis and diarrhea. History of cirrhosis and alcohol abuse. EXAM: CT ABDOMEN AND PELVIS WITHOUT CONTRAST TECHNIQUE: Multidetector CT imaging of the abdomen and pelvis was performed following the standard protocol without IV contrast. Oral contrast administered. COMPARISON:  None. FINDINGS: LOWER CHEST: Lung bases are clear. The visualized heart size is normal. No pericardial effusion. Contrast distended esophagus gas with periesophageal varicosities. HEPATOBILIARY: Nodular liver. Granuloma RIGHT lobe of the liver. Faint densities layering in gallbladder equivocal for sludge or gallstones. PANCREAS: Normal. SPLEEN: Normal. ADRENALS/URINARY TRACT: Kidneys are orthotopic, demonstrating normal size and morphology. No nephrolithiasis, hydronephrosis; limited assessment for renal masses by nonenhanced CT. The unopacified ureters are normal in course and caliber. Urinary bladder is partially distended and unremarkable. Normal adrenal glands. STOMACH/BOWEL: The stomach, small and large bowel are normal in course and caliber without inflammatory changes, poor contrast opacification  of colon limiting assessment. Normal appendix. VASCULAR/LYMPHATIC: Aortoiliac vessels are normal in course and caliber. No lymphadenopathy by CT size criteria. REPRODUCTIVE: IUD in central uterus. OTHER: Large volume low-density ascites. Mesenteric edema and nodular omentum. MUSCULOSKELETAL: Non-acute.  Status post L5-S1 PLIF. IMPRESSION: 1. Cirrhosis and large volume ascites. 2. Nodular omentum, carcinomatosis possible. Recommend correlation with sampling of ascites. Electronically Signed   By: Elon Alas M.D.   On: 04/25/2018 01:52   US Paracentesis  Result Date: 05/02/2018 INDICATION: History of ETOH cirrhosis with recurrent ascites, esophageal varices, GI bleed and encephalopathy. Request for therapeutic paracentesis today. EXAM: ULTRASOUND GUIDED THERAPEUTIC PARACENTESIS MEDICATIONS: 10 mL 1% lidocaine COMPLICATIONS: None immediate. PROCEDURE: Informed written consent was obtained from the patient after a discussion of the risks, benefits and alternatives to treatment. A timeout was performed prior to the initiation of the procedure. Initial ultrasound scanning demonstrates a large amount of ascites within the left lower abdominal quadrant. The left lower abdomen was prepped and draped in the usual sterile fashion. 1% lidocaine was used for local anesthesia. Following this, a 19 gauge, 7-cm, Yueh catheter was introduced. An ultrasound image was saved for documentation purposes. The paracentesis was performed. The catheter was removed and a dressing was applied. The patient tolerated the procedure well without immediate post procedural complication. FINDINGS: A total of approximately 3.6 L of clear yellow fluid was removed. IMPRESSION: Successful ultrasound-guided paracentesis yielding 3.6 liters of peritoneal fluid. Read by Candiss Norse, PA-C Electronically Signed   By: Jacqulynn Cadet M.D.   On: 05/02/2018 11:08   Ir Paracentesis  Result Date: 04/26/2018 INDICATION: Ascites likely  secondary to alcohol abuse with cirrhosis. Also nodular omentum seen on CT scan. Request for diagnostic and therapeutic paracentesis up to 4 L. EXAM: ULTRASOUND GUIDED PARACENTESIS MEDICATIONS: 1% lidocaine 10 mL COMPLICATIONS: None immediate. PROCEDURE: Informed written consent was obtained from the patient after a discussion of the risks, benefits and alternatives to treatment. A timeout was performed prior to the initiation of the procedure. Initial ultrasound scanning demonstrates a large amount of ascites within the right lower abdominal quadrant. The right lower abdomen was prepped and draped in the usual sterile fashion. 1% lidocaine with epinephrine  was used for local anesthesia. Following this, a 19 gauge, 7-cm, Yueh catheter was introduced. An ultrasound image was saved for documentation purposes. The paracentesis was performed. The catheter was removed and a dressing was applied. The patient tolerated the procedure well without immediate post procedural complication. FINDINGS: A total of approximately 4 L of clear yellow fluid was removed. Samples were sent to the laboratory as requested by the clinical team. IMPRESSION: Successful ultrasound-guided paracentesis yielding 4 liters of peritoneal fluid. Read by: Gareth Eagle, PA-C Electronically Signed   By: Aletta Edouard M.D.   On: 04/26/2018 14:00    Micro Results     Recent Results (from the past 240 hour(s))  MRSA PCR Screening     Status: None   Collection Time: 04/25/18  3:25 AM  Result Value Ref Range Status   MRSA by PCR NEGATIVE NEGATIVE Final    Comment:        The GeneXpert MRSA Assay (FDA approved for NASAL specimens only), is one component of a comprehensive MRSA colonization surveillance program. It is not intended to diagnose MRSA infection nor to guide or monitor treatment for MRSA infections. Performed at [facility], [place] [address], [place], [place] [address]   Gram stain     Status: None   Collection Time:  04/26/18  1:34 PM  Result Value Ref Range Status   Specimen Description FLUID  Final   Special Requests NONE  Final   Gram Stain   Final    WBC PRESENT, PREDOMINANTLY MONONUCLEAR NO ORGANISMS SEEN CYTOSPIN SMEAR Performed at [facility], [place] [address], [place], [place] [address]    Report Status 04/26/2018 FINAL  Final  Culture, body fluid-bottle     Status: None   Collection Time: 04/26/18  3:30 PM  Result Value Ref Range Status   Specimen Description PERITONEAL CAVITY  Final   Special Requests NONE  Final   Culture   Final    NO GROWTH 5 DAYS Performed at [facility] [address] [place], [place] [address]    Report Status 05/01/2018 FINAL  Final       Today   Subjective:   Katie Holland today has no headache,no chest or abdominal pain,no new weakness tingling or numbness, feels much better wants to go home today.   Objective:   Blood pressure 97/80, pulse 90, temperature 98.7 F (37.1 C), temperature source Oral, resp. rate 14, height '5\' 4"'$  (1.626 m), weight 72.1 kg, SpO2 98 %.   Intake/Output Summary (Last 24 hours) at 05/03/2018 1302 Last data filed at 05/02/2018 1825 Gross per 24 hour  Intake 14.83 ml  Output -  Net 14.83 ml    Exam Awake Alert, Oriented x 3, No new F.N deficits, Normal affect Symmetrical Chest wall movement, Good air movement bilaterally, CTAB RRR,No Gallops,Rubs or new Murmurs, No Parasternal Heave +ve B.Sounds, Abd Soft, Non tender, mild ascitis, No rebound -guarding or rigidity. No Cyanosis, Clubbing or edema, No new Rash or bruise  Data Review   CBC w Diff:  Lab Results  Component Value Date   WBC 5.9 05/02/2018   HGB 8.8 (L) 05/02/2018   HCT 25.5 (L) 05/02/2018   PLT 70 (L) 05/02/2018   LYMPHOPCT 19 04/24/2018   MONOPCT 14 04/24/2018   EOSPCT 1 04/24/2018   BASOPCT 1 04/24/2018    CMP:  Lab Results  Component Value Date   NA 133 (L) 05/02/2018   K 3.1 (L) 05/02/2018  CL 103 05/02/2018   CO2 23  05/02/2018   BUN 7 05/02/2018   CREATININE 0.80 05/02/2018   PROT 5.2 (L) 05/02/2018   ALBUMIN 1.5 (L) 05/02/2018   BILITOT 6.2 (H) 05/02/2018   ALKPHOS 105 05/02/2018   AST 64 (H) 05/02/2018   ALT 42 05/02/2018  .   Total Time in preparing paper work, data evaluation and todays exam - 67 minutes  Phillips Climes M.D on 05/03/2018 at 1:02 PM  [facility]   Office  [phone]

## 2018-05-03 NOTE — Discharge Instructions (Signed)
Follow with Primary MD Billie Ruddy, MD in 7 days   Get CBC, CMP, ammoia, INR checked  by Primary MD next visit.    Activity: As tolerated with Full fall precautions use walker/cane & assistance as needed   Disposition Home with home health   Diet: Heart Healthy , low salt  , with feeding assistance and aspiration precautions.  For Heart failure patients - Check your Weight same time everyday, if you gain over 2 pounds, or you develop in leg swelling, experience more shortness of breath or chest pain, call your Primary MD immediately. Follow Cardiac Low Salt Diet and 1.5 lit/day fluid restriction.   On your next visit with your primary care physician please Get Medicines reviewed and adjusted.   Please request your Prim.MD to go over all Hospital Tests and Procedure/Radiological results at the follow up, please get all Hospital records sent to your Prim MD by signing hospital release before you go home.   If you experience worsening of your admission symptoms, develop shortness of breath, life threatening emergency, suicidal or homicidal thoughts you must seek medical attention immediately by calling 911 or calling your MD immediately  if symptoms less severe.  You Must read complete instructions/literature along with all the possible adverse reactions/side effects for all the Medicines you take and that have been prescribed to you. Take any new Medicines after you have completely understood and accpet all the possible adverse reactions/side effects.   Do not drive, operating heavy machinery, perform activities at heights, swimming or participation in water activities or provide baby sitting services if your were admitted for syncope or siezures until you have seen by Primary MD or a Neurologist and advised to do so again.  Do not drive when taking Pain medications.    Do not take more than prescribed Pain, Sleep and Anxiety Medications  Special Instructions: If you have smoked  or chewed Tobacco  in the last 2 yrs please stop smoking, stop any regular Alcohol  and or any Recreational drug use.  Wear Seat belts while driving.   Please note  You were cared for by a hospitalist during your hospital stay. If you have any questions about your discharge medications or the care you received while you were in the hospital after you are discharged, you can call the unit and asked to speak with the hospitalist on call if the hospitalist that took care of you is not available. Once you are discharged, your primary care physician will handle any further medical issues. Please note that NO REFILLS for any discharge medications will be authorized once you are discharged, as it is imperative that you return to your primary care physician (or establish a relationship with a primary care physician if you do not have one) for your aftercare needs so that they can reassess your need for medications and monitor your lab values.

## 2018-05-10 ENCOUNTER — Telehealth: Payer: Self-pay | Admitting: Family Medicine

## 2018-05-10 NOTE — Telephone Encounter (Signed)
Copied from [place] [id number]. Topic: Quick Communication - Home Health Verbal Orders >> May 10, 2018  2:26 PM Conception Chancy, NT wrote: Caller/Agency: Jeff/ PT [place] Number: [phone] Requesting OT/PT/Skilled Nursing/Social Work:  PT Frequency: 2x a week for 2 weeks and 1x a week for 2 weeks.

## 2018-05-12 NOTE — Telephone Encounter (Signed)
Ok for verbal orders ?

## 2018-05-12 NOTE — Telephone Encounter (Signed)
ok 

## 2018-05-13 NOTE — Telephone Encounter (Signed)
Left message for Merry Proud to return call for verbal orders.

## 2018-05-17 ENCOUNTER — Other Ambulatory Visit: Payer: Self-pay

## 2018-05-17 ENCOUNTER — Ambulatory Visit: Payer: Self-pay | Admitting: *Deleted

## 2018-05-17 ENCOUNTER — Emergency Department (HOSPITAL_COMMUNITY)
Admission: EM | Admit: 2018-05-17 | Discharge: 2018-05-17 | Disposition: A | Discharge status: Home / Self Care | Payer: Medicare Other | Attending: Emergency Medicine | Admitting: Emergency Medicine

## 2018-05-17 ENCOUNTER — Encounter (HOSPITAL_COMMUNITY): Payer: Self-pay | Admitting: Emergency Medicine

## 2018-05-17 ENCOUNTER — Ambulatory Visit: Payer: Medicare Other | Admitting: Family Medicine

## 2018-05-17 DIAGNOSIS — Z79899 Other long term (current) drug therapy: Secondary | ICD-10-CM | POA: Insufficient documentation

## 2018-05-17 DIAGNOSIS — R188 Other ascites: Secondary | ICD-10-CM

## 2018-05-17 DIAGNOSIS — K7031 Alcoholic cirrhosis of liver with ascites: Secondary | ICD-10-CM | POA: Diagnosis not present

## 2018-05-17 DIAGNOSIS — R109 Unspecified abdominal pain: Secondary | ICD-10-CM | POA: Diagnosis not present

## 2018-05-17 DIAGNOSIS — F17210 Nicotine dependence, cigarettes, uncomplicated: Secondary | ICD-10-CM | POA: Insufficient documentation

## 2018-05-17 DIAGNOSIS — Z91040 Latex allergy status: Secondary | ICD-10-CM | POA: Diagnosis not present

## 2018-05-17 LAB — CBC
HEMATOCRIT: 31 % — AB (ref 36.0–46.0)
Hemoglobin: 10.1 g/dL — ABNORMAL LOW (ref 12.0–15.0)
MCH: 27.7 pg (ref 26.0–34.0)
MCHC: 32.6 g/dL (ref 30.0–36.0)
MCV: 85.2 fL (ref 80.0–100.0)
NRBC: 0 % (ref 0.0–0.2)
Platelets: 120 10*3/uL — ABNORMAL LOW (ref 150–400)
RBC: 3.64 MIL/uL — ABNORMAL LOW (ref 3.87–5.11)
RDW: 22.9 % — AB (ref 11.5–15.5)
WBC: 4.9 10*3/uL (ref 4.0–10.5)

## 2018-05-17 LAB — URINALYSIS, ROUTINE W REFLEX MICROSCOPIC
Bilirubin Urine: NEGATIVE
GLUCOSE, UA: NEGATIVE mg/dL
Hgb urine dipstick: NEGATIVE
KETONES UR: NEGATIVE mg/dL
Nitrite: NEGATIVE
PROTEIN: NEGATIVE mg/dL
Specific Gravity, Urine: 1.01 (ref 1.005–1.030)
pH: 5 (ref 5.0–8.0)

## 2018-05-17 LAB — COMPREHENSIVE METABOLIC PANEL
ALBUMIN: 2.4 g/dL — AB (ref 3.5–5.0)
ALT: 33 U/L (ref 0–44)
AST: 60 U/L — AB (ref 15–41)
Alkaline Phosphatase: 160 U/L — ABNORMAL HIGH (ref 38–126)
Anion gap: 11 (ref 5–15)
BUN: 13 mg/dL (ref 6–20)
CHLORIDE: 102 mmol/L (ref 98–111)
CO2: 21 mmol/L — AB (ref 22–32)
CREATININE: 1.16 mg/dL — AB (ref 0.44–1.00)
Calcium: 8.8 mg/dL — ABNORMAL LOW (ref 8.9–10.3)
GFR calc Af Amer: >60 mL/min (ref >60)
GFR calc non Af Amer: 53 mL/min — ABNORMAL LOW (ref >60)
Glucose, Bld: 123 mg/dL — ABNORMAL HIGH (ref 70–99)
Potassium: 3 mmol/L — ABNORMAL LOW (ref 3.5–5.1)
SODIUM: 134 mmol/L — AB (ref 135–145)
Total Bilirubin: 9.6 mg/dL — ABNORMAL HIGH (ref 0.3–1.2)
Total Protein: 7.1 g/dL (ref 6.5–8.1)

## 2018-05-17 LAB — LIPASE, BLOOD: LIPASE: 25 U/L (ref 11–51)

## 2018-05-17 LAB — I-STAT BETA HCG BLOOD, ED (MC, WL, AP ONLY): I-stat hCG, quantitative: <5 m[IU]/mL (ref <5)

## 2018-05-17 MED ORDER — POTASSIUM CHLORIDE CRYS ER 20 MEQ PO TBCR
40.0000 meq | EXTENDED_RELEASE_TABLET | Freq: Once | ORAL | Status: AC
  Administered 2018-05-17: 40 meq via ORAL
  Filled 2018-05-17: qty 2

## 2018-05-17 MED ORDER — SODIUM CHLORIDE 0.9 % IV BOLUS: 500.0000 mL | Freq: Once | INTRAVENOUS | Status: DC

## 2018-05-17 NOTE — ED Notes (Signed)
Patient verbalizes understanding of discharge instructions. Opportunity for questioning and answers were provided. Armband removed by staff, pt discharged from ED ambulatory.   

## 2018-05-17 NOTE — Telephone Encounter (Addendum)
Patient has been having increased GI symptoms with increased bloating- daughter is calling to report. She is not with patient now- but did give contact number for patient- [phone].  Patient reports she took lactulose she can't get to the bathroom quick enough. Patient is still having some bloating- she is eating light- she is progressing diet- she is finding out what she can and can not eat. Patient states she is not having pain with bloating-although her daughter stated it seemed very tight yesterday. Patient states if she is up and moving and staying hydrated- it seems to be better. Advised patient to report any fever, pain, changes in bowel- bleeding. Patient states she will. Called daughter and reported mother seems stable at this time- I will send note for provider review.  Call to office with concerns that the mother states she is fine- but the daughter states her abdomen was very tight yesterday. Not sure if patient is doing as well as she reports.  Answer Assessment - Initial Assessment Questions 1. DIARRHEA SEVERITY: "How bad is the diarrhea?" "How many extra stools have you had in the past 24 hours than normal?"    - NO DIARRHEA (SCALE 0)   - MILD (SCALE 1-3): Few loose or mushy BMs; increase of 1-3 stools over normal daily number of stools; mild increase in ostomy output.   -  MODERATE (SCALE 4-7): Increase of 4-6 stools daily over normal; moderate increase in ostomy output. * SEVERE (SCALE 8-10; OR 'WORST POSSIBLE'): Increase of 7 or more stools daily over normal; moderate increase in ostomy output; incontinence.     2 loose stool today- over the week end- twice daily 2. ONSET: "When did the diarrhea begin?"      today 3. BM CONSISTENCY: "How loose or watery is the diarrhea?"      Watery stools at this point 4. VOMITING: "Are you also vomiting?" If so, ask: "How many times in the past 24 hours?"      No vomiting 5. ABDOMINAL PAIN: "Are you having any abdominal pain?" If yes:  "What does it feel like?" (e.g., crampy, dull, intermittent, constant)      On side where tubes were- but otherwise no 6. ABDOMINAL PAIN SEVERITY: If present, ask: "How bad is the pain?"  (e.g., Scale 1-10; mild, moderate, or severe)   - MILD (1-3): doesn't interfere with normal activities, abdomen soft and not tender to touch    - MODERATE (4-7): interferes with normal activities or awakens from sleep, tender to touch    - SEVERE (8-10): excruciating pain, doubled over, unable to do any normal activities       Mild- tightness with bloating- no pain 7. ORAL INTAKE: If vomiting, "Have you been able to drink liquids?" "How much fluids have you had in the past 24 hours?"     Gatorade and water  8. HYDRATION: "Any signs of dehydration?" (e.g., dry mouth [not just dry lips], too weak to stand, dizziness, new weight loss) "When did you last urinate?"     Urinating normally and staying hydrated 9. EXPOSURE: "Have you traveled to a foreign country recently?" "Have you been exposed to anyone with diarrhea?" "Could you have eaten any food that was spoiled?"     n/a 10. ANTIBIOTIC USE: "Are you taking antibiotics now or have you taken antibiotics in the past 2 months?"       Hospitalized recently 11. OTHER SYMPTOMS: "Do you have any other symptoms?" (e.g., fever, blood in stool)  No- muscle aches- but other than that patient reports she is doing fine 12. PREGNANCY: "Is there any chance you are pregnant?" "When was your last menstrual period?"       n/a  Protocols used: DIARRHEA-A-AH

## 2018-05-17 NOTE — Telephone Encounter (Signed)
Vinie Sill with [facility] with approval for verbal orders for pt  as requested

## 2018-05-17 NOTE — ED Provider Notes (Signed)
[facility] EMERGENCY DEPARTMENT Provider Note   CSN: [id number] Arrival date & time: 05/17/18  1422     History   Chief Complaint Chief Complaint  Patient presents with  . Abdominal Pain    HPI Jakhia Buxton is a 52 y.o. female.  Patient with hx etoh related liver disease/cirrhosis, presents with abd distension c/w prior ascites. States last had paracentesis 2 weeks ago with improvement in symptoms. States compliant w home meds. Is having normal bms, loose. No constipation. Denies nausea/vomiting. No focal abd pain, but states the distension is diffusely uncomfortable. No back or flank pain. No fever or chills.   The history is provided by the patient.  Abdominal Pain   Pertinent negatives include fever, vomiting and headaches.    Past Medical History:  Diagnosis Date  . Alcoholic liver disease ([place])   . Chronic back pain   . Cirrhosis of liver ([place])   . GERD (gastroesophageal reflux disease)   . Jaundice     Patient Active Problem List   Diagnosis Date Noted  . Protein-calorie malnutrition, severe 04/26/2018  . Bleeding esophageal varices ([place])   . Abdominal pain 04/24/2018  . Hematemesis 04/24/2018  . Hematochezia 04/24/2018  . Alcohol abuse 04/24/2018  . Hyponatremia 04/24/2018  . Hypokalemia 04/24/2018  . Metabolic acidosis, normal anion gap (NAG) 04/24/2018  . History of GI bleed 10/26/2017  . Alcoholic cirrhosis of liver with ascites ([place]) 10/26/2017  . Cigarette nicotine dependence without complication 01/60/1093    Past Surgical History:  Procedure Laterality Date  . BACK SURGERY    . ESOPHAGEAL BANDING  04/25/2018   Procedure: ESOPHAGEAL BANDING;  Surgeon: Yetta Flock, MD;  Location: [facility] ENDOSCOPY;  Service: Gastroenterology;;  . ESOPHAGEAL BANDING  04/27/2018   Procedure: ESOPHAGEAL BANDING;  Surgeon: Milus Banister, MD;  Location: [facility] ENDOSCOPY;  Service: Endoscopy;;  . ESOPHAGOGASTRODUODENOSCOPY N/A 04/27/2018   Procedure:  ESOPHAGOGASTRODUODENOSCOPY (EGD);  Surgeon: Milus Banister, MD;  Location: [facility] ENDOSCOPY;  Service: Endoscopy;  Laterality: N/A;  . ESOPHAGOGASTRODUODENOSCOPY (EGD) WITH PROPOFOL N/A 04/25/2018   Procedure: ESOPHAGOGASTRODUODENOSCOPY (EGD) WITH PROPOFOL;  Surgeon: Yetta Flock, MD;  Location: [facility];  Service: Gastroenterology;  Laterality: N/A;  . IR PARACENTESIS  04/26/2018     OB History   None      Home Medications    Prior to Admission medications   Medication Sig Start Date End Date Taking? Authorizing Provider  ferrous sulfate 325 (65 FE) MG EC tablet Take 1 tablet (325 mg total) by mouth 2 (two) times daily. 05/03/18 05/03/19  Elgergawy, Silver Huguenin, MD  folic acid (FOLVITE) 1 MG tablet Take 1 tablet (1 mg total) by mouth daily. 05/03/18   Elgergawy, Silver Huguenin, MD  furosemide (LASIX) 80 MG tablet Take 1 tablet (80 mg total) by mouth daily. 05/03/18   Elgergawy, Silver Huguenin, MD  Lactulose 20 GM/30ML SOLN Take 45 mLs (30 g total) by mouth 3 (three) times daily. Take 30 mls every eight hours. Titrate for 3 bm per 24 hrs 05/03/18   Elgergawy, Silver Huguenin, MD  Multiple Vitamin (MULTIVITAMIN WITH MINERALS) TABS tablet Take 1 tablet by mouth daily. 05/03/18   Elgergawy, Silver Huguenin, MD  pantoprazole (PROTONIX) 40 MG tablet Take 1 tablet (40 mg total) by mouth daily. Patient not taking: Reported on 04/24/2018 10/26/17   Billie Ruddy, MD  potassium chloride (K-DUR) 10 MEQ tablet Take 1 tablet (10 mEq total) by mouth daily. 05/03/18   Elgergawy, Silver Huguenin, MD  prednisoLONE 5 MG TABS tablet Take 8 tablets (40 mg total) by mouth daily. 05/03/18   Elgergawy, Silver Huguenin, MD  spironolactone (ALDACTONE) 100 MG tablet Take 1 tablet (100 mg total) by mouth 2 (two) times daily. 05/03/18   Elgergawy, Silver Huguenin, MD  thiamine 100 MG tablet Take 1 tablet (100 mg total) by mouth daily. 05/03/18   Elgergawy, Silver Huguenin, MD    Family History No family history on file.  Social History Social History    Tobacco Use  . Smoking status: Current Every Day Smoker  . Smokeless tobacco: Never Used  Substance Use Topics  . Alcohol use: Yes  . Drug use: Not Currently     Allergies   Contrast media [iodinated diagnostic agents]; Latex; and Sulfa antibiotics   Review of Systems Review of Systems  Constitutional: Negative for fever.  HENT: Negative for sore throat.   Eyes: Negative for redness.  Respiratory: Negative for cough and shortness of breath.   Cardiovascular: Negative for chest pain.  Gastrointestinal: Positive for abdominal pain. Negative for vomiting.  Genitourinary: Negative for flank pain.  Musculoskeletal: Negative for back pain and neck pain.  Skin: Negative for rash.  Neurological: Negative for headaches.  Hematological: Does not bruise/bleed easily.  Psychiatric/Behavioral: Negative for confusion.     Physical Exam Updated Vital Signs BP 124/83   Pulse (!) 110   Temp 98.3 F (36.8 C) (Oral)   Resp 20   SpO2 100%   Physical Exam  Constitutional: She appears well-developed and well-nourished.  HENT:  Mouth/Throat: Oropharynx is clear and moist.  Eyes: Conjunctivae are normal. No scleral icterus.  Neck: Neck supple. No tracheal deviation present.  Cardiovascular: Normal rate, regular rhythm, normal heart sounds and intact distal pulses. Exam reveals no gallop and no friction rub.  No murmur heard. Pulmonary/Chest: Effort normal and breath sounds normal. No respiratory distress.  Abdominal: Soft. Normal appearance and bowel sounds are normal. She exhibits distension. She exhibits no mass. There is no tenderness. There is no rebound and no guarding. No hernia.  Genitourinary:  Genitourinary Comments: No cva tenderness.   Musculoskeletal:  Mild bil foot/ankle edema.  Neurological: She is alert.  Alert, oriented. No confusion. Speech clear. Steady gait.   Skin: Skin is warm and dry. No rash noted.  Psychiatric: She has a normal mood and affect.  Nursing  note and vitals reviewed.    ED Treatments / Results  Labs (all labs ordered are listed, but only abnormal results are displayed) Results for orders placed or performed during the hospital encounter of 05/17/18  Lipase, blood  Result Value Ref Range   Lipase 25 11 - 51 U/L  Comprehensive metabolic panel  Result Value Ref Range   Sodium 134 (L) 135 - 145 mmol/L   Potassium 3.0 (L) 3.5 - 5.1 mmol/L   Chloride 102 98 - 111 mmol/L   CO2 21 (L) 22 - 32 mmol/L   Glucose, Bld 123 (H) 70 - 99 mg/dL   BUN 13 6 - 20 mg/dL   Creatinine, Ser 1.16 (H) 0.44 - 1.00 mg/dL   Calcium 8.8 (L) 8.9 - 10.3 mg/dL   Total Protein 7.1 6.5 - 8.1 g/dL   Albumin 2.4 (L) 3.5 - 5.0 g/dL   AST 60 (H) 15 - 41 U/L   ALT 33 0 - 44 U/L   Alkaline Phosphatase 160 (H) 38 - 126 U/L   Total Bilirubin 9.6 (H) 0.3 - 1.2 mg/dL   GFR calc non Af Amer 53 (  L) >60 mL/min   GFR calc Af Amer >60 >60 mL/min   Anion gap 11 5 - 15  CBC  Result Value Ref Range   WBC 4.9 4.0 - 10.5 K/uL   RBC 3.64 (L) 3.87 - 5.11 MIL/uL   Hemoglobin 10.1 (L) 12.0 - 15.0 g/dL   HCT 31.0 (L) 36.0 - 46.0 %   MCV 85.2 80.0 - 100.0 fL   MCH 27.7 26.0 - 34.0 pg   MCHC 32.6 30.0 - 36.0 g/dL   RDW 22.9 (H) 11.5 - 15.5 %   Platelets 120 (L) 150 - 400 K/uL   nRBC 0.0 0.0 - 0.2 %  Urinalysis, Routine w reflex microscopic  Result Value Ref Range   Color, Urine AMBER (A) YELLOW   APPearance HAZY (A) CLEAR   Specific Gravity, Urine 1.010 1.005 - 1.030   pH 5.0 5.0 - 8.0   Glucose, UA NEGATIVE NEGATIVE mg/dL   Hgb urine dipstick NEGATIVE NEGATIVE   Bilirubin Urine NEGATIVE NEGATIVE   Ketones, ur NEGATIVE NEGATIVE mg/dL   Protein, ur NEGATIVE NEGATIVE mg/dL   Nitrite NEGATIVE NEGATIVE   Leukocytes, UA SMALL (A) NEGATIVE   RBC / HPF 0-5 0 - 5 RBC/hpf   WBC, UA 0-5 0 - 5 WBC/hpf   Bacteria, UA RARE (A) NONE SEEN   Squamous Epithelial / LPF 11-20 0 - 5   Mucus PRESENT    Hyaline Casts, UA PRESENT    Non Squamous Epithelial 0-5 (A) NONE SEEN   I-Stat beta hCG blood, ED  Result Value Ref Range   I-stat hCG, quantitative <5.0 <5 mIU/mL   Comment 3           Ct Abdomen Pelvis Wo Contrast  Result Date: 04/25/2018 CLINICAL DATA:  Abdominal pain, hematemesis and diarrhea. History of cirrhosis and alcohol abuse. EXAM: CT ABDOMEN AND PELVIS WITHOUT CONTRAST TECHNIQUE: Multidetector CT imaging of the abdomen and pelvis was performed following the standard protocol without IV contrast. Oral contrast administered. COMPARISON:  None. FINDINGS: LOWER CHEST: Lung bases are clear. The visualized heart size is normal. No pericardial effusion. Contrast distended esophagus gas with periesophageal varicosities. HEPATOBILIARY: Nodular liver. Granuloma RIGHT lobe of the liver. Faint densities layering in gallbladder equivocal for sludge or gallstones. PANCREAS: Normal. SPLEEN: Normal. ADRENALS/URINARY TRACT: Kidneys are orthotopic, demonstrating normal size and morphology. No nephrolithiasis, hydronephrosis; limited assessment for renal masses by nonenhanced CT. The unopacified ureters are normal in course and caliber. Urinary bladder is partially distended and unremarkable. Normal adrenal glands. STOMACH/BOWEL: The stomach, small and large bowel are normal in course and caliber without inflammatory changes, poor contrast opacification of colon limiting assessment. Normal appendix. VASCULAR/LYMPHATIC: Aortoiliac vessels are normal in course and caliber. No lymphadenopathy by CT size criteria. REPRODUCTIVE: IUD in central uterus. OTHER: Large volume low-density ascites. Mesenteric edema and nodular omentum. MUSCULOSKELETAL: Non-acute.  Status post L5-S1 PLIF. IMPRESSION: 1. Cirrhosis and large volume ascites. 2. Nodular omentum, carcinomatosis possible. Recommend correlation with sampling of ascites. Electronically Signed   By: Elon Alas M.D.   On: 04/25/2018 01:52   US Paracentesis  Result Date: 05/02/2018 INDICATION: History of ETOH cirrhosis with  recurrent ascites, esophageal varices, GI bleed and encephalopathy. Request for therapeutic paracentesis today. EXAM: ULTRASOUND GUIDED THERAPEUTIC PARACENTESIS MEDICATIONS: 10 mL 1% lidocaine COMPLICATIONS: None immediate. PROCEDURE: Informed written consent was obtained from the patient after a discussion of the risks, benefits and alternatives to treatment. A timeout was performed prior to the initiation of the procedure. Initial ultrasound scanning demonstrates  a large amount of ascites within the left lower abdominal quadrant. The left lower abdomen was prepped and draped in the usual sterile fashion. 1% lidocaine was used for local anesthesia. Following this, a 19 gauge, 7-cm, Yueh catheter was introduced. An ultrasound image was saved for documentation purposes. The paracentesis was performed. The catheter was removed and a dressing was applied. The patient tolerated the procedure well without immediate post procedural complication. FINDINGS: A total of approximately 3.6 L of clear yellow fluid was removed. IMPRESSION: Successful ultrasound-guided paracentesis yielding 3.6 liters of peritoneal fluid. Read by Candiss Norse, PA-C Electronically Signed   By: Jacqulynn Cadet M.D.   On: 05/02/2018 11:08   Ir Paracentesis  Result Date: 04/26/2018 INDICATION: Ascites likely secondary to alcohol abuse with cirrhosis. Also nodular omentum seen on CT scan. Request for diagnostic and therapeutic paracentesis up to 4 L. EXAM: ULTRASOUND GUIDED PARACENTESIS MEDICATIONS: 1% lidocaine 10 mL COMPLICATIONS: None immediate. PROCEDURE: Informed written consent was obtained from the patient after a discussion of the risks, benefits and alternatives to treatment. A timeout was performed prior to the initiation of the procedure. Initial ultrasound scanning demonstrates a large amount of ascites within the right lower abdominal quadrant. The right lower abdomen was prepped and draped in the usual sterile fashion. 1%  lidocaine with epinephrine was used for local anesthesia. Following this, a 19 gauge, 7-cm, Yueh catheter was introduced. An ultrasound image was saved for documentation purposes. The paracentesis was performed. The catheter was removed and a dressing was applied. The patient tolerated the procedure well without immediate post procedural complication. FINDINGS: A total of approximately 4 L of clear yellow fluid was removed. Samples were sent to the laboratory as requested by the clinical team. IMPRESSION: Successful ultrasound-guided paracentesis yielding 4 liters of peritoneal fluid. Read by: Gareth Eagle, PA-C Electronically Signed   By: Aletta Edouard M.D.   On: 04/26/2018 14:00    EKG None  Radiology No results found.  Procedures Procedures (including critical care time)  Medications Ordered in ED Medications - No data to display   Initial Impression / Assessment and Plan / ED Course  I have reviewed the triage vital signs and the nursing notes.  Pertinent labs & imaging results that were available during my care of the patient were reviewed by me and considered in my medical decision making (see chart for details).  Labs sent.   Reviewed nursing notes and prior charts for additional history.   U/s paracentesis ordered.   Labs reviewed - k low. kcl po.   Cr mildly elev. Recent relatively poor po intake. No vomiting. Iv ns bolus.   Pt tolerating po.  abd is soft and non tender.  Radiology/IR/[facility] called and they indicate unable to do paracentesis today, but able to do as outpt radiology procedure tomorrow at 2 pm - they indicate for pt to arrive at 130 tomororow.  Pt is agreeable with this plan.  Pt is breathing comfortably, tolerating po, afebrile, abd soft nt, +distended - Pt currently appears stable for d/c.   Return precautions provided.      Final Clinical Impressions(s) / ED Diagnoses   Final diagnoses:  Ascites    ED Discharge Orders    None         Lajean Saver, MD 05/17/18 1709

## 2018-05-17 NOTE — ED Triage Notes (Signed)
Here for abd distention , states  Starts  10 dfys ago  Some n/v/d she states , has had to have it tapped before

## 2018-05-17 NOTE — ED Notes (Signed)
Per IR, pt cannot have paracentesis today. Will have to schedule outpatient appointment tomorrow at 2:00 pm. Pt and MD aware.

## 2018-05-17 NOTE — Discharge Instructions (Addendum)
It was our pleasure to provide your ER care today - we hope that you feel better.  We talked with our radiology department - they indicate for you to go directly to the radiology department tomorrow, for paracentesis - radiology is located on the first floor of this hospital - please arrive at 1:30 pm for the procedure that has been arranged for 2:00 pm - tell them that we spoke with Abigail Butts in radiology today.  From todays lab tests - your potassium level is low (3) - eat plenty of fruits and vegetables, and follow up with primary care doctor in 1 week for recheck.   Return to ER if worse, new symptoms, fevers, severe abdominal pain, persistent vomiting, other concern.

## 2018-05-17 NOTE — Telephone Encounter (Signed)
  Reason for Disposition . [1] MILD diarrhea (e.g., 1-3 or more stools than normal in past 24 hours) without known cause AND [2] present >  7 days    Patient has been in hospital recently and she is under treatment for chronic illness- reported to office for review of call.  Protocols used: [facility]

## 2018-05-17 NOTE — Telephone Encounter (Signed)
Pt scheduled to see [facility] today.

## 2018-05-17 NOTE — ED Notes (Signed)
Pt ambulated to bathroom with assistance of her walker. Pt given urine sample cup and instructed to bring back to room.

## 2018-05-18 ENCOUNTER — Other Ambulatory Visit (HOSPITAL_COMMUNITY): Payer: Self-pay | Admitting: Emergency Medicine

## 2018-05-18 ENCOUNTER — Ambulatory Visit (HOSPITAL_COMMUNITY)
Admission: RE | Admit: 2018-05-18 | Discharge: 2018-05-18 | Disposition: A | Discharge status: Home / Self Care | Payer: Medicare Other | Source: Ambulatory Visit | Attending: Emergency Medicine | Admitting: Emergency Medicine

## 2018-05-18 ENCOUNTER — Encounter (HOSPITAL_COMMUNITY): Payer: Self-pay | Admitting: Diagnostic Radiology

## 2018-05-18 DIAGNOSIS — R188 Other ascites: Secondary | ICD-10-CM

## 2018-05-18 DIAGNOSIS — K7031 Alcoholic cirrhosis of liver with ascites: Secondary | ICD-10-CM | POA: Diagnosis not present

## 2018-05-18 HISTORY — PX: IR PARACENTESIS: IMG2679

## 2018-05-18 MED ORDER — LIDOCAINE HCL (PF) 1 % IJ SOLN
INTRAMUSCULAR | Status: DC | PRN
  Administered 2018-05-18: 10 mL

## 2018-05-18 MED ORDER — LIDOCAINE HCL 1 % IJ SOLN
INTRAMUSCULAR | Status: AC
  Filled 2018-05-18: qty 20

## 2018-05-18 NOTE — Procedures (Signed)
PROCEDURE SUMMARY:  Successful US guided paracentesis from right lateral abdomen.  Yielded 6.5 L of clear yellow fluid.  No immediate complications.  Patient tolerated well.  EBL < 5 mL   Ladarrious Kirksey S Dorrance Sellick PA-C 05/18/2018 3:46 PM

## 2018-05-21 ENCOUNTER — Telehealth: Payer: Self-pay | Admitting: Family Medicine

## 2018-05-21 NOTE — Telephone Encounter (Signed)
Copied from [place] [id number]. Topic: Quick Communication - Rx Refill/Question >> May 21, 2018  1:49 PM Reyne Dumas L wrote: Medication: prednisoLONE 5 MG TABS tablet  Myriam Jacobson from [facility] calling.  States she needs to know if pt is supposed to be on this medication.  States it is on pt's med list, but pt states she has been out for several days.  Per NT send message to office.  Myriam Jacobson can be reached at [phone]

## 2018-05-24 ENCOUNTER — Encounter: Payer: Self-pay | Admitting: Family Medicine

## 2018-05-24 ENCOUNTER — Telehealth: Payer: Self-pay

## 2018-05-24 ENCOUNTER — Ambulatory Visit (INDEPENDENT_AMBULATORY_CARE_PROVIDER_SITE_OTHER): Payer: Medicare Other | Admitting: Family Medicine

## 2018-05-24 VITALS — BP 102/88 | HR 114 | Temp 98.4°F | Wt 141.0 lb

## 2018-05-24 DIAGNOSIS — K7031 Alcoholic cirrhosis of liver with ascites: Secondary | ICD-10-CM | POA: Diagnosis not present

## 2018-05-24 DIAGNOSIS — E43 Unspecified severe protein-calorie malnutrition: Secondary | ICD-10-CM | POA: Diagnosis not present

## 2018-05-24 NOTE — Progress Notes (Signed)
Subjective:    Patient ID: Katie Holland, female    DOB: September 21, 1965, 52 y.o.   MRN: [id number]  No chief complaint on file. Patient accompanied by her son-in-law.  HPI Patient with pmh sig for alcoholic cirrhosis, esophageal varices, nicotine dependence was seen today for HFU and ED f/u.  Patient admitted 84/1-32/44 for alcoholic cirrhosis with ascites.  Patient had abdominal distention, pain, coffee-ground emesis, and dark stools.  Would be our GI consulted, pt s/p EGD and variceal banding (7 bands placed)on 11/3.  Pt transfused 2 u pRBCs.  Pt underwent diagnostic and therapeutic paracentesis on 11/4, 4 L removed.  Pt had a 3.6 L removed at second therapeutic paracentesis w/ albumin x 2 given on 05/02/2018.  Pt went to the ED on 11/25 for continued abdominal distention.  She was unable to have paracentesis done in the ED, but was scheduled with outpatient radiology for 11/26.  Since discharge from hospital patient states she is feeling somewhat better.  Pt states she has stopped drinking.  Pt's son-in-law is unsure if if this is accurate.  Pt states she is motivated to do better.  Pt endorses medication compliance.  Pt notes itching all over which is improving.  Pt missed her initial outpatient GI appt as she was late.  Pt has appointment scheduled for December 9.     Past Medical History:  Diagnosis Date  . Alcoholic liver disease ([place])   . Chronic back pain   . Cirrhosis of liver ([place])   . GERD (gastroesophageal reflux disease)   . Jaundice     Allergies  Allergen Reactions  . Contrast Media [Iodinated Diagnostic Agents] Itching and Swelling  . Latex Itching and Swelling    No breathing impairment, however  . Sulfa Antibiotics Itching and Swelling    No breathing impairment, however    ROS General: Denies fever, chills, night sweats, changes in weight, changes in appetite HEENT: Denies headaches, ear pain, changes in vision, rhinorrhea, sore throat  + yellowing of eyes CV: Denies  CP, palpitations, SOB, orthopnea Pulm: Denies SOB, cough, wheezing GI: Denies abdominal pain, nausea, vomiting, diarrhea, constipation  + abdominal distention GU: Denies dysuria, hematuria, frequency, vaginal discharge Msk: Denies muscle cramps, joint pains Neuro: Denies weakness, numbness, tingling Skin: Denies rashes, bruising  + pruritus Psych: Denies depression, anxiety, hallucinations     Objective:    Blood pressure 102/88, pulse (!) 114, temperature 98.4 F (36.9 C), temperature source Oral, weight 141 lb (64 kg), SpO2 96 %.  Gen. Pleasant, well-nourished, in no distress, normal affect   HEENT: Normal/AT, face symmetric, scleral icterus, PERRLA, nares patent without drainage.  No spider telangiectasias Lungs: no accessory muscle use, CTAB, no wheezes or rales Cardiovascular: Tachycardic, no m/r/g, no peripheral edema Abdomen: BS present, soft, NT, moderately distended but not tight, no caput medusa Neuro:  A&Ox3, CN II-XII intact, ambulates with a cane Skin:  Warm, dry, no lesions/ rash  Wt Readings from Last 3 Encounters:  05/24/18 141 lb (64 kg)  05/02/18 158 lb 15.2 oz (72.1 kg)    Lab Results  Component Value Date   WBC 4.9 05/17/2018   HGB 10.1 (L) 05/17/2018   HCT 31.0 (L) 05/17/2018   PLT 120 (L) 05/17/2018   GLUCOSE 123 (H) 05/17/2018   ALT 33 05/17/2018   AST 60 (H) 05/17/2018   NA 134 (L) 05/17/2018   K 3.0 (L) 05/17/2018   CL 102 05/17/2018   CREATININE 1.16 (H) 05/17/2018   BUN 13 05/17/2018  CO2 21 (L) 05/17/2018   INR 2.10 04/30/2018    Assessment/Plan:  Alcoholic cirrhosis of liver with ascites ([place])  -Will obtain labs: CMP, Ammonia, CBC -called GI to move pt's appt up as she will likely need therapeutic paracentesis prior to December 9.  Message given to Dr. Doyne Keel nurse.  GI to call patient with appointment details.   -Tentative appointment on Thursday, December 5 at 2 PM - Plan: CBC with Differential/Platelet, Comprehensive metabolic  panel, Ammonia -Stressed the importance of avoiding alcohol.  -Discussed the possibility of hospice in the future depending on disease progression.  Protein-calorie malnutrition, severe -Pt encouraged to monitor sodium intake -Encouraged to eat several small meals if possible.  Follow-up PRN in the next few weeks, sooner if needed  Grier Mitts, MD

## 2018-05-24 NOTE — Telephone Encounter (Signed)
error 

## 2018-05-24 NOTE — Telephone Encounter (Signed)
Patient was evluated in the office with Dr. Volanda Napoleon.  She asked that we see the patient this week.  She will come in and see Nicoletta Ba PA on Thursday.  Dr. Volanda Napoleon feels patient most likely will need a paracentesis soon. Last 05/18/18.  Dr. Havery Moros please advise

## 2018-05-24 NOTE — Patient Instructions (Signed)
Cirrhosis Cirrhosis is long-term (chronic) liver injury. The liver is your largest internal organ, and it performs many functions. The liver converts food into energy, removes toxic material from your blood, makes important proteins, and absorbs necessary vitamins from your diet. If you have cirrhosis, it means many of your healthy liver cells have been replaced by scar tissue. This prevents blood from flowing through your liver, which makes it difficult for your liver to function. This scarring is not reversible, but treatment can prevent it from getting worse. What are the causes? Hepatitis C and long-term alcohol abuse are the most common causes of cirrhosis. Other causes include:  Nonalcoholic fatty liver disease.  Hepatitis B infection.  Autoimmune hepatitis.  Diseases that cause blockage of ducts inside the liver.  Inherited liver diseases.  Reactions to certain long-term medicines.  Parasitic infections.  Long-term exposure to certain toxins.  What increases the risk? You may have a higher risk of cirrhosis if you:  Have certain hepatitis viruses.  Abuse alcohol, especially if you are female.  Are overweight.  Share needles.  Have unprotected sex with someone who has hepatitis.  What are the signs or symptoms? You may not have any signs and symptoms at first. Symptoms may not develop until the damage to your liver starts to get worse. Signs and symptoms of cirrhosis may include:  Tenderness in the right-upper part of your abdomen.  Weakness and tiredness (fatigue).  Loss of appetite.  Nausea.  Weight loss and muscle loss.  Itchiness.  Yellow skin and eyes (jaundice).  Buildup of fluid in the abdomen (ascites).  Swelling of the feet and ankles (edema).  Appearance of tiny blood vessels under the skin.  Mental confusion.  Easy bruising and bleeding.  How is this diagnosed? Your health care provider may suspect cirrhosis based on your symptoms and  medical history, especially if you have other medical conditions or a history of alcohol abuse. Your health care provider will do a physical exam to feel your liver and check for signs of cirrhosis. Your health care provider may perform other tests, including:  Blood tests to check: ? Whether you have hepatitis B or C. ? Kidney function. ? Liver function.  Imaging tests such as: ? MRI or CT scan to look for changes seen in advanced cirrhosis. ? Ultrasound to see if normal liver tissue is being replaced by scar tissue.  A procedure using a long needle to take a sample of liver tissue (biopsy) for examination under a microscope. Liver biopsy can confirm the diagnosis of cirrhosis.  How is this treated? Treatment depends on how damaged your liver is and what caused the damage. Treatment may include treating cirrhosis symptoms or treating the underlying causes of the condition to try to slow the progression of the damage. Treatment may include:  Making lifestyle changes, such as: ? Eating a healthy diet. ? Restricting salt intake. ? Maintaining a healthy weight. ? Not abusing drugs or alcohol.  Taking medicines to: ? Treat liver infections or other infections. ? Control itching. ? Reduce fluid buildup. ? Reduce certain blood toxins. ? Reduce risk of bleeding from enlarged blood vessels in the stomach or esophagus (varices).  If varices are causing bleeding problems, you may need treatment with a procedure that ties up the vessels causing them to fall off (band ligation).  If cirrhosis is causing your liver to fail, your health care provider may recommend a liver transplant.  Other treatments may be recommended depending on any complications   of cirrhosis, such as liver-related kidney failure (hepatorenal syndrome).  Follow these instructions at home:  Take medicines only as directed by your health care provider. Do not use drugs that are toxic to your liver. Ask your health care  provider before taking any new medicines, including over-the-counter medicines.  Rest as needed.  Eat a well-balanced diet. Ask your health care provider or dietitian for more information.  You may have to follow a low-salt diet or restrict your water intake as directed.  Do not drink alcohol. This is especially important if you are taking acetaminophen.  Keep all follow-up visits as directed by your health care provider. This is important. Contact a health care provider if:  You have fatigue or weakness that is getting worse.  You develop swelling of the hands, feet, legs, or face.  You have a fever.  You develop loss of appetite.  You have nausea or vomiting.  You develop jaundice.  You develop easy bruising or bleeding. Get help right away if:  You vomit bright red blood or a material that looks like coffee grounds.  You have blood in your stools.  Your stools appear black and tarry.  You become confused.  You have chest pain or trouble breathing. This information is not intended to replace advice given to you by your health care provider. Make sure you discuss any questions you have with your health care provider. Document Released: 06/09/2005 Document Revised: 10/18/2015 Document Reviewed: 02/15/2014 Elsevier Interactive Patient Education  2018 [employer].  Esophageal Varices The esophagus is the passage that connects the throat to the stomach. Esophageal varices are blood vessels in the esophagus that have become enlarged. They develop when extra blood is forced to flow through them because the blood's normal pathway is blocked. Without treatment these blood vessels eventually break and bleed (hemorrhage). A hemorrhage is life-threatening. What are the causes? This condition may be caused by:  Scarring of the liver (cirrhosis) due to alcoholism. This is the most common cause.  Liver disease.  Severe heart failure.  A blood clot in the portal  vein.  Sarcoidosis. This is an inflammatory disease that can affect the liver.  Schistosomiasis. This is a parasitic infection that can cause liver damage.  What are the signs or symptoms? Usually there are no symptoms unless the esophageal varices bleed. Symptoms of bleeding esophageal varices include:  Vomiting material that is bright red or that is black and looks like coffee grounds.  Coughing up blood.  Black, tarry stools.  Dizziness or lightheadedness.  Low blood pressure.  Loss of consciousness.  How is this diagnosed? This condition is diagnosed with tests, such as:  Endoscopy. During this test a thin, lighted tube is inserted through the mouth and into the esophagus.  Blood tests. These may be done to check liver function, blood counts, and the body's ability to form blood clots.  How is this treated? This condition may be treated with:  Medicines that reduce pressure in the esophageal varices and reduce the risk of bleeding.  Procedures to reduce pressure in the esophageal varices and reduce the risk of bleeding or stop bleeding. These include: ? Variceal ligation. In this procedure, a rubber band is placed around the esophageal varices to keep them from bleeding. ? Injection therapy. This treatment involves an injection that causes the esophageal varices to shrink and close (sclerotherapy). Medicines that tighten blood vessels or alter blood flow may also be used. ? Balloon tamponade. In this procedure, a tube  is put into the esophagus and a balloon is passed through it and inflated. ? Transjugular intrahepatic portosystemic shunt (TIPS) placement. In this procedure, a small tube is placed within the liver veins. This decreases blood flow and pressure to the esophageal varices.  A liver transplant. This may be done if other treatments do not work.  Follow these instructions at home:  Take medicines only as directed by your health care provider.  Follow your  health care provider's instructions about rest and physical activity. Get help right away if:  You have any symptoms of this condition after treatment.  You are unable to eat or drink.  You have chest pain. This information is not intended to replace advice given to you by your health care provider. Make sure you discuss any questions you have with your health care provider. Document Released: 08/30/2003 Document Revised: 11/15/2015 Document Reviewed: 06/05/2014 Elsevier Interactive Patient Education  2018 [employer]. Alcoholic Liver Disease The liver is an organ that converts food into energy, absorbs vitamins from food, removes toxins from the blood, and makes proteins. Alcoholic liver disease happens when the liver becomes damaged due to alcohol consumption and it stops working properly. What are the causes? This condition is caused by drinking too much alcohol over a number of years. What increases the risk? This condition is more likely to develop in:  Women.  People who have a family history of the disease.  People who have poor nutrition.  People who are obese.  What are the signs or symptoms? Early symptoms of this condition include:  Fatigue.  Weakness.  Abdominal discomfort on the upper right side.  Symptoms of moderate disease include:  Fever.  Yellow, pale, or darkening skin.  Abdominal pain.  Nausea and vomiting.  Weight loss.  Loss of appetite.  Symptoms of advanced disease include:  Abdominal swelling.  Nosebleeds or bleeding gums.  Itchy skin.  Enlarged fingertips (clubbing).  Light-colored stools that smell very bad (steatorrhea).  Mood changes.  Feeling agitated.  Confusion.  Trouble concentrating.  Some people do not have symptoms until the condition becomes severe. Symptoms are often worse after heavy drinking. How is this diagnosed? This condition is diagnosed with:  A physical exam.  Blood tests.  Taking a sample of  liver tissue to examine under a microscope (liver biopsy).  You may also have other tests, including:  X-rays.  Ultrasound.  How is this treated? Treatment may include:  Stopping alcohol use to allow the liver to heal.  Joining a support group or meeting with a counselor.  Medicines to reduce inflammation. These may be recommended if the disease is severe.  A liver transplant. This is the only treatment if the disease is very severe.  Nutritional therapy. This may involve: ? Taking vitamins. ? Eating foods that are high in thiamine, such as whole-wheat cereals, pork, and raw vegetables. ? Eating foods that have a lot of folic acid, such as vegetables, fruits, meats, beans, nuts, and dairy foods. ? Eating a diet that includes carbohydrate-rich foods, such as yogurt, beans, potatoes, and rice.  Follow these instructions at home:  Do not drink alcohol.  Take medicines only as directed by your health care provider.  Take vitamins only as directed by your health care provider.  Follow any diet instructions that are given to you by your health care provider. Contact a health care provider if:  You have a fever.  You have shortness of breath or have difficulty breathing.  You  have bright red blood in your stool, or you have black, tarry stools.  You are vomiting blood.  Your skin color becomes more yellow, pale, or dark.  You develop headaches.  You have trouble thinking.  You have problems balancing or walking. This information is not intended to replace advice given to you by your health care provider. Make sure you discuss any questions you have with your health care provider. Document Released: 06/30/2014 Document Revised: 11/15/2015 Document Reviewed: 05/11/2014 Elsevier Interactive Patient Education  [employer].

## 2018-05-25 LAB — COMPREHENSIVE METABOLIC PANEL
ALBUMIN: 2.9 g/dL — AB (ref 3.5–5.2)
ALT: 27 U/L (ref 0–35)
AST: 52 U/L — ABNORMAL HIGH (ref 0–37)
Alkaline Phosphatase: 199 U/L — ABNORMAL HIGH (ref 39–117)
BUN: 22 mg/dL (ref 6–23)
CALCIUM: 9.3 mg/dL (ref 8.4–10.5)
CO2: 22 meq/L (ref 19–32)
Chloride: 100 mEq/L (ref 96–112)
Creatinine, Ser: 1.58 mg/dL — ABNORMAL HIGH (ref 0.40–1.20)
GFR: 43.93 mL/min — ABNORMAL LOW (ref >60.00)
Glucose, Bld: 102 mg/dL — ABNORMAL HIGH (ref 70–99)
POTASSIUM: 4.5 meq/L (ref 3.5–5.1)
SODIUM: 134 meq/L — AB (ref 135–145)
Total Bilirubin: 7.9 mg/dL — ABNORMAL HIGH (ref 0.2–1.2)
Total Protein: 7.1 g/dL (ref 6.0–8.3)

## 2018-05-25 LAB — CBC WITH DIFFERENTIAL/PLATELET
BASOS PCT: 1.1 % (ref 0.0–3.0)
Basophils Absolute: 0.1 10*3/uL (ref 0.0–0.1)
EOS ABS: 0.1 10*3/uL (ref 0.0–0.7)
EOS PCT: 2.1 % (ref 0.0–5.0)
HEMATOCRIT: 37.1 % (ref 36.0–46.0)
HEMOGLOBIN: 12.2 g/dL (ref 12.0–15.0)
Lymphocytes Relative: 24.8 % (ref 12.0–46.0)
Lymphs Abs: 1.5 10*3/uL (ref 0.7–4.0)
MCHC: 33 g/dL (ref 30.0–36.0)
MCV: 90.8 fl (ref 78.0–100.0)
MONO ABS: 0.8 10*3/uL (ref 0.1–1.0)
Monocytes Relative: 12.5 % — ABNORMAL HIGH (ref 3.0–12.0)
Neutro Abs: 3.6 10*3/uL (ref 1.4–7.7)
Neutrophils Relative %: 59.5 % (ref 43.0–77.0)
PLATELETS: 160 10*3/uL (ref 150.0–400.0)
RBC: 4.08 Mil/uL (ref 3.87–5.11)
RDW: 22.2 % — AB (ref 11.5–15.5)
WBC: 6.1 10*3/uL (ref 4.0–10.5)

## 2018-05-25 NOTE — Telephone Encounter (Signed)
Patient reports "I am doing okay" Patient can't come today for the lab work.  She does not have transportation She will come tomorrow when her daughter can bring her.  She will keep the appt for Thursday with Amy [facility] PA

## 2018-05-25 NOTE — Telephone Encounter (Signed)
Looks like she had an order for labs yesterday per PCP, she needs to go to the lab and have CMET, CBC, INR. I would prefer them to be ordered under me so I can see the results. If renal function stable may need to increase her diuretics. If she is significantly distended and does not think she can wait until clinic appointment this week can place order to IR for large volume paracentesis, give albumin if > 5 L removed. Thanks

## 2018-05-25 NOTE — Telephone Encounter (Signed)
Spoke with Myriam Jacobson from [facility] regarding pt Rx for prednisolone, requests if pt should continue taking this medication and if so is it ok to send refill to pt pharmacy.

## 2018-05-25 NOTE — Addendum Note (Signed)
Addended by: Marlon Pel on: 05/25/2018 10:48 AM   Modules accepted: Orders

## 2018-05-25 NOTE — Addendum Note (Signed)
Addended by: Gwynne Edinger on: 05/25/2018 10:30 AM   Modules accepted: Orders

## 2018-05-26 ENCOUNTER — Telehealth: Payer: Self-pay | Admitting: Gastroenterology

## 2018-05-26 NOTE — Telephone Encounter (Signed)
Reuel Derby, PT assistant with [facility] called requesting some medication for pt so she can making through the night before her appt tomorrow. She stated that pt has too much liquid in her stomach. Mary's phone is [phone]. She said that we could also call pt.

## 2018-05-26 NOTE — Telephone Encounter (Signed)
The prednisone 40 mg daily was started by GI while patient was hospitalized.  Patient should follow-up with the GI for further recommendations in regards to prednisone.  Patient has an appointment on Thursday with GI.

## 2018-05-26 NOTE — Telephone Encounter (Signed)
Left message for Katie Holland to call back 

## 2018-05-27 ENCOUNTER — Other Ambulatory Visit (INDEPENDENT_AMBULATORY_CARE_PROVIDER_SITE_OTHER): Payer: Medicare Other

## 2018-05-27 ENCOUNTER — Encounter: Payer: Self-pay | Admitting: Physician Assistant

## 2018-05-27 ENCOUNTER — Ambulatory Visit (INDEPENDENT_AMBULATORY_CARE_PROVIDER_SITE_OTHER): Payer: Medicare Other | Admitting: Physician Assistant

## 2018-05-27 VITALS — BP 104/74 | HR 116 | Ht 64.0 in | Wt 140.1 lb

## 2018-05-27 DIAGNOSIS — K7011 Alcoholic hepatitis with ascites: Secondary | ICD-10-CM

## 2018-05-27 DIAGNOSIS — K7030 Alcoholic cirrhosis of liver without ascites: Secondary | ICD-10-CM

## 2018-05-27 DIAGNOSIS — K7290 Hepatic failure, unspecified without coma: Secondary | ICD-10-CM

## 2018-05-27 LAB — CBC WITH DIFFERENTIAL/PLATELET
Basophils Absolute: 0 10*3/uL (ref 0.0–0.1)
Basophils Relative: 0.4 % (ref 0.0–3.0)
Eosinophils Absolute: 0.1 10*3/uL (ref 0.0–0.7)
Eosinophils Relative: 1.3 % (ref 0.0–5.0)
HCT: 35.3 % — ABNORMAL LOW (ref 36.0–46.0)
Hemoglobin: 11.5 g/dL — ABNORMAL LOW (ref 12.0–15.0)
Lymphocytes Relative: 25.1 % (ref 12.0–46.0)
Lymphs Abs: 1.2 10*3/uL (ref 0.7–4.0)
MCHC: 32.5 g/dL (ref 30.0–36.0)
MCV: 90.3 fl (ref 78.0–100.0)
Monocytes Absolute: 0.6 10*3/uL (ref 0.1–1.0)
Monocytes Relative: 11.6 % (ref 3.0–12.0)
NEUTROS PCT: 61.6 % (ref 43.0–77.0)
Neutro Abs: 3 10*3/uL (ref 1.4–7.7)
Platelets: 148 10*3/uL — ABNORMAL LOW (ref 150.0–400.0)
RBC: 3.91 Mil/uL (ref 3.87–5.11)
RDW: 22.1 % — ABNORMAL HIGH (ref 11.5–15.5)
WBC: 4.8 10*3/uL (ref 4.0–10.5)

## 2018-05-27 LAB — PROTIME-INR
INR: 1.6 ratio — ABNORMAL HIGH (ref 0.8–1.0)
Prothrombin Time: 18.5 s — ABNORMAL HIGH (ref 9.6–13.1)

## 2018-05-27 LAB — COMPREHENSIVE METABOLIC PANEL
ALT: 28 U/L (ref 0–35)
AST: 50 U/L — ABNORMAL HIGH (ref 0–37)
Albumin: 2.8 g/dL — ABNORMAL LOW (ref 3.5–5.2)
Alkaline Phosphatase: 212 U/L — ABNORMAL HIGH (ref 39–117)
BUN: 25 mg/dL — ABNORMAL HIGH (ref 6–23)
CO2: 23 mEq/L (ref 19–32)
Calcium: 9 mg/dL (ref 8.4–10.5)
Chloride: 101 mEq/L (ref 96–112)
Creatinine, Ser: 1.92 mg/dL — ABNORMAL HIGH (ref 0.40–1.20)
GFR: 35.08 mL/min — ABNORMAL LOW (ref >60.00)
Glucose, Bld: 135 mg/dL — ABNORMAL HIGH (ref 70–99)
Potassium: 4.4 mEq/L (ref 3.5–5.1)
Sodium: 134 mEq/L — ABNORMAL LOW (ref 135–145)
Total Bilirubin: 7 mg/dL — ABNORMAL HIGH (ref 0.2–1.2)
Total Protein: 6.9 g/dL (ref 6.0–8.3)

## 2018-05-27 LAB — AMMONIA: AMMONIA: 95 umol/L — AB (ref 11–35)

## 2018-05-27 MED ORDER — SPIRONOLACTONE 100 MG PO TABS: 100.0000 mg | ORAL_TABLET | Freq: Two times a day (BID) | ORAL | 3 refills | Status: DC

## 2018-05-27 MED ORDER — LACTULOSE 20 GM/30ML PO SOLN: 45.0000 mL | Freq: Three times a day (TID) | ORAL | 6 refills | Status: DC

## 2018-05-27 MED ORDER — FUROSEMIDE 80 MG PO TABS: 80.0000 mg | ORAL_TABLET | Freq: Every day | ORAL | 4 refills | Status: DC

## 2018-05-27 MED ORDER — RIFAXIMIN 550 MG PO TABS: 550.0000 mg | ORAL_TABLET | Freq: Two times a day (BID) | ORAL | 11 refills | Status: DC

## 2018-05-27 NOTE — Telephone Encounter (Signed)
Patient was evaluated in the office today by Nicoletta Ba PA

## 2018-05-27 NOTE — Progress Notes (Signed)
Subjective:    Patient ID: Katie Holland, female    DOB: 08/09/1965, 52 y.o.   MRN: [id number]  HPI Katie Holland is a pleasant 52 year old African-American female, new to the office today who was seen in consultation by Dr. Havery Moros during recent hospitalization when she presented with abdominal pain distention and hematemesis.  She was admitted on 04/24/2018 with diagnosis of decompensated alcoholic liver disease which had been previously diagnosed in [facility].  Her family relates that she did have a hospitalization earlier this year in [place] due to the decompensated cirrhosis and ascites and did undergo a couple of paracenteses. During her recent hospitalization at [facility] she had EGD on 04/25/2018 which showed multiple columns of large varices and 7 bands were placed also noted diffuse duodenitis consistent with portal hypertensive changes.  Due to concerns about repeat bleeding she had EGD again on 04/27/2018 per Dr. Ardis Hughs.  There was no blood noted in the esophagus or the stomach, portal gastropathy was noted and she still had large varices so for more bands were placed. She underwent paracentesis on 04/26/2018, then again on 05/02/2018.  She had an ER visit on 05/17/2018 and then subsequently had another paracentesis the following day with removal of 6.5 L of fluid.  She was discharged from the hospital about 2-1/2 weeks ago on Lasix 80 mg daily and Aldactone 100 mg twice daily which she says she is taking. Despite the fairly high-dose diuretics she has had rapid reaccumulation of ascites and says her abdomen feels very distended this makes her feel somewhat short of breath.  She denies pain.  Not had any fever or chills.  No nausea vomiting or hematemesis and says her bowel movements are loose but appear normal.  She has been taking lactulose 45 cc 3 times daily says she has 2-3 bowel movements per day.  She remains weak.  She is currently living alone but has family support here  in [place].  She denies any recent EtOH use.  Last alcohol prior to admission in early November.  CT imaging was also done during recent hospitalization which did not show any adenopathy there was a large amount of ascites and mesenteric edema and also noted a somewhat nodular omentum and sampling the ascites was suggested. She has had cell counts, Gram stain's and cultures but has not had cytology done.  Labs from 05/24/2018 hemoglobin 12.2 platelets 160 creatinine 1.58 which is up from 1.16 a week ago, sodium 134, T bili 7.9, alk phos 199, AST 52, ALT of 27.  She does not have any peripheral edema.  She has not not been following a low-sodium diet and was unaware that she should be.  Review of Systems Pertinent positive and negative review of systems were noted in the above HPI section.  All other review of systems was otherwise negative.  Outpatient Encounter Medications as of 05/27/2018  Medication Sig  . ferrous sulfate 325 (65 FE) MG EC tablet Take 1 tablet (325 mg total) by mouth 2 (two) times daily.  . folic acid (FOLVITE) 1 MG tablet Take 1 tablet (1 mg total) by mouth daily.  . furosemide (LASIX) 80 MG tablet Take 1 tablet (80 mg total) by mouth daily.  . Lactulose 20 GM/30ML SOLN Take 45 mLs (30 g total) by mouth 3 (three) times daily. Take 30 mls every eight hours. Titrate for 3 bm per 24 hrs  . Multiple Vitamin (MULTIVITAMIN WITH MINERALS) TABS tablet Take 1 tablet by mouth daily.  [name]  pantoprazole (PROTONIX) 40 MG tablet Take 1 tablet (40 mg total) by mouth daily.  . potassium chloride (K-DUR) 10 MEQ tablet Take 1 tablet (10 mEq total) by mouth daily.  . prednisoLONE 5 MG TABS tablet Take 8 tablets (40 mg total) by mouth daily.  [name] spironolactone (ALDACTONE) 100 MG tablet Take 1 tablet (100 mg total) by mouth 2 (two) times daily.  [name] thiamine 100 MG tablet Take 1 tablet (100 mg total) by mouth daily.  . [DISCONTINUED] furosemide (LASIX) 80 MG tablet Take 1 tablet (80 mg total) by  mouth daily.  . [DISCONTINUED] Lactulose 20 GM/30ML SOLN Take 45 mLs (30 g total) by mouth 3 (three) times daily. Take 30 mls every eight hours. Titrate for 3 bm per 24 hrs  . [DISCONTINUED] spironolactone (ALDACTONE) 100 MG tablet Take 1 tablet (100 mg total) by mouth 2 (two) times daily.  . rifaximin (XIFAXAN) 550 MG TABS tablet Take 1 tablet (550 mg total) by mouth 2 (two) times daily.   No facility-administered encounter medications on file as of 05/27/2018.    Allergies  Allergen Reactions  . Contrast Media [Iodinated Diagnostic Agents] Itching and Swelling  . Latex Itching and Swelling    No breathing impairment, however  . Sulfa Antibiotics Itching and Swelling    No breathing impairment, however   Patient Active Problem List   Diagnosis Date Noted  . Protein-calorie malnutrition, severe 04/26/2018  . Bleeding esophageal varices ([place])   . Abdominal pain 04/24/2018  . Hematemesis 04/24/2018  . Hematochezia 04/24/2018  . Alcohol abuse 04/24/2018  . Hyponatremia 04/24/2018  . Hypokalemia 04/24/2018  . Metabolic acidosis, normal anion gap (NAG) 04/24/2018  . History of GI bleed 10/26/2017  . Alcoholic cirrhosis of liver with ascites ([place]) 10/26/2017  . Cigarette nicotine dependence without complication 40/98/1191   Social History   Socioeconomic History  . Marital status: Single    Spouse name: Not on file  . Number of children: Not on file  . Years of education: Not on file  . Highest education level: Not on file  Occupational History  . Not on file  Social Needs  . Financial resource strain: Not on file  . Food insecurity:    Worry: Not on file    Inability: Not on file  . Transportation needs:    Medical: Not on file    Non-medical: Not on file  Tobacco Use  . Smoking status: Current Every Day Smoker  . Smokeless tobacco: Never Used  Substance and Sexual Activity  . Alcohol use: Yes  . Drug use: Not Currently  . Sexual activity: Not on file  Lifestyle  .  Physical activity:    Days per week: Not on file    Minutes per session: Not on file  . Stress: Not on file  Relationships  . Social connections:    Talks on phone: Not on file    Gets together: Not on file    Attends religious service: Not on file    Active member of club or organization: Not on file    Attends meetings of clubs or organizations: Not on file    Relationship status: Not on file  . Intimate partner violence:    Fear of current or ex partner: Not on file    Emotionally abused: Not on file    Physically abused: Not on file    Forced sexual activity: Not on file  Other Topics Concern  . Not on file  Social History Narrative  .  Not on file    Ms. Novicki's family history is not on file.      Objective:    Vitals:   05/27/18 1347  BP: 104/74  Pulse: (!) 116    Physical Exam; well-developed, chronically ill-appearing African-American female in no acute distress.  Pleasant, accompanied by her daughter and son-in-law.  Blood pressure 104/74 pulse 116, height 5 foot 4, weight 140, BMI 24.0.  HEENT; nontraumatic normocephalic EOMI PERRLA sclera are icteric, oral mucosa moist, Cardiovascular ;bounding regular rhythm with S1 and S2 no murmur rub or gallop, Pulmonary ;clear bilaterally, Abdomen; grossly protuberant with fairly tight ascites not tense, nontender, she has palpable nodular liver border in the right upper quadrant and palpable spleen tip in the left mid quadrant.  Rectal ;exam not done, Extremities ;no clubbing cyanosis or edema.  Neuropsych ;alert and oriented, with a nonfocal mood and affect appropriate, mentating well no asterixis       Assessment & Plan:   #54 52 year old African-American female with decompensated alcohol induced cirrhosis with recent hospitalization with abdominal distention and hematemesis.  She also met criteria for alcoholic hepatitis, and has been on a 28-day course of prednisolone. She has been stable since discharge, however has had 3  large-volume paracenteses within the past month with rapid reaccumulation of ascites despite diuretics. CT scan 1 month ago also mentioned a nodular omentum which may be secondary to underlying cirrhosis, however warrants further investigation to rule out malignancy.  #2 subacute variceal bleed-large varices noted at recent EGD x2 with multiple bands placed. #3 portal gastropathy  Plan; start 2 g sodium diet, this was reviewed with the patient and her daughter and emphasized importance of long-term adherence  Will schedule for  large-volume paracentesis-with removal of 6 to 7 L of fluid.  Fluid to be sent for cell counts and cytology.  Hesitant to increase dose of diuretics as her creatinine has been slowly on the rise.  For today we will continue Lasix 80 mg p.o. daily and Aldactone 100 mg twice daily and will adjust depending on his else of today's creatinine.  Labs today to include CBC, C met, INR, ammonia, AFP and CA 125 Continue lactulose 45 cc p.o. 3 times daily Start Xifaxan 500 mg p.o. twice daily Continue Protonix 40 mg daily Patient will complete current course of prednisolone 40 mg p.o. daily complete 28-day course  She will need follow-up EGD with Dr. Havery Moros and probable repeat banding.  She will see Dr. Havery Moros back in the office for follow-up in early January and can schedule at that time. Patient commended for staying off of alcohol and encouraged to continue complete abstinence Her daughter is to get her a scale and to weigh herself daily and record weights.   Calculated from today;s labs MELD =25 DF=32  Addendum; today's labs reviewed and creatinine now 1.9 Will discuss further with Dr. Havery Moros, for now decrease Lasix to 60 mg p.o. every morning and Aldactone 150 mg p.o. once daily. Repeat BMET in 1 week.   S  PA-C 05/27/2018   Cc: Billie Ruddy, MD

## 2018-05-27 NOTE — Patient Instructions (Addendum)
If you are age 53 or older, your body mass index should be between 23-30. Your Body mass index is 24.05 kg/m. If this is out of the aforementioned range listed, please consider follow up with your Primary Care Provider.  If you are age 63 or younger, your body mass index should be between 19-25. Your Body mass index is 24.05 kg/m. If this is out of the aformentioned range listed, please consider follow up with your Primary Care Provider.   You have been scheduled for ultrasound-guided IR paracentesis at [facility]  Radiology Dept.on May 31, 2018 at 12:45  Your provider has requested that you go to the basement level for lab work before leaving today. Press "B" on the elevator. The lab is located at the first door on the left as you exit the elevator.  We have sent the following medications to your pharmacy for you to pick up at your convenience: Lasix Aldactone Lactulose Xifaxan  Do daily weight checks and record.  Follow 2 gm Sodium Diet.  You have been given a handout.  Follow up with Dr. Havery Moros on 06/29/18 at 2:45 pm.  Thank you for choosing me and [facility].   Amy Esterwood, PA-C

## 2018-05-27 NOTE — Telephone Encounter (Signed)
Left a message on [facility] regarding pt, gave Dr Volanda Napoleon recommendation regarding pt concerns with the prednisone Rx

## 2018-05-28 ENCOUNTER — Other Ambulatory Visit: Payer: Self-pay

## 2018-05-28 ENCOUNTER — Other Ambulatory Visit: Payer: Self-pay | Admitting: Physician Assistant

## 2018-05-28 LAB — CA 125: CA 125: 485 U/mL — ABNORMAL HIGH (ref <35)

## 2018-05-28 LAB — AFP TUMOR MARKER: AFP-Tumor Marker: 2.7 ng/mL

## 2018-05-28 MED ORDER — SPIRONOLACTONE 100 MG PO TABS: 150.0000 mg | ORAL_TABLET | Freq: Every day | ORAL | 1 refills | Status: DC

## 2018-05-28 MED ORDER — FUROSEMIDE 20 MG PO TABS: 60.0000 mg | ORAL_TABLET | Freq: Every day | ORAL | 1 refills | Status: DC

## 2018-05-28 NOTE — Progress Notes (Signed)
Agree with assessment and plan as outlined. Patient has decompensated cirrhosis with high MELD - history of varices and difficult to manage ascites with encephalopathy. Her CA 125 level is markedly elevated and quite concerning. I agree she should have had cytology with her paracentesis, this was recommended previously and unclear why not done while inpatient. Agree with repeat large volume paracentesis, albumin if > 5 L removed, with cytology, and may need to have large volume paracentesis once every 7-14 days. Will need to review her cytology first. Pending her course she may need formal hepatology consult although is not yet a transplant candidate given relatively recent alcohol use. If she continues to have severe ascites and diuretics can't manage it, may need to consider TIPS if ascites is not malignant. Will need to be closely followed moving forward.

## 2018-05-31 ENCOUNTER — Ambulatory Visit (HOSPITAL_COMMUNITY)
Admission: RE | Admit: 2018-05-31 | Discharge: 2018-05-31 | Disposition: A | Discharge status: Home / Self Care | Payer: Medicare Other | Source: Ambulatory Visit | Attending: Physician Assistant | Admitting: Physician Assistant

## 2018-05-31 ENCOUNTER — Encounter (HOSPITAL_COMMUNITY): Payer: Self-pay | Admitting: Physician Assistant

## 2018-05-31 ENCOUNTER — Ambulatory Visit: Payer: Medicare Other | Admitting: Gastroenterology

## 2018-05-31 DIAGNOSIS — K7031 Alcoholic cirrhosis of liver with ascites: Secondary | ICD-10-CM | POA: Diagnosis not present

## 2018-05-31 DIAGNOSIS — K922 Gastrointestinal hemorrhage, unspecified: Secondary | ICD-10-CM | POA: Diagnosis not present

## 2018-05-31 DIAGNOSIS — R1110 Vomiting, unspecified: Secondary | ICD-10-CM | POA: Diagnosis not present

## 2018-05-31 DIAGNOSIS — J9690 Respiratory failure, unspecified, unspecified whether with hypoxia or hypercapnia: Secondary | ICD-10-CM | POA: Diagnosis not present

## 2018-05-31 DIAGNOSIS — R188 Other ascites: Secondary | ICD-10-CM | POA: Diagnosis not present

## 2018-05-31 DIAGNOSIS — Z8719 Personal history of other diseases of the digestive system: Secondary | ICD-10-CM | POA: Diagnosis not present

## 2018-05-31 DIAGNOSIS — K7030 Alcoholic cirrhosis of liver without ascites: Secondary | ICD-10-CM

## 2018-05-31 HISTORY — PX: IR PARACENTESIS: IMG2679

## 2018-05-31 LAB — BODY FLUID CELL COUNT WITH DIFFERENTIAL
Eos, Fluid: 0 %
Lymphs, Fluid: 27 %
MONOCYTE-MACROPHAGE-SEROUS FLUID: 57 % (ref 50–90)
Neutrophil Count, Fluid: 16 % (ref 0–25)
Other Cells, Fluid: 12 %
Total Nucleated Cell Count, Fluid: 56 cu mm (ref 0–1000)

## 2018-05-31 MED ORDER — LIDOCAINE HCL 1 % IJ SOLN
INTRAMUSCULAR | Status: AC
  Filled 2018-05-31: qty 20

## 2018-05-31 MED ORDER — LIDOCAINE HCL 1 % IJ SOLN
INTRAMUSCULAR | Status: DC | PRN
  Administered 2018-05-31: 10 mL

## 2018-05-31 MED ORDER — ALBUMIN HUMAN 25 % IV SOLN
INTRAVENOUS | Status: AC
  Filled 2018-05-31: qty 200

## 2018-05-31 NOTE — Procedures (Signed)
PROCEDURE SUMMARY:  Successful US guided paracentesis from right lateral abdomen.  Yielded 7 Liters of clear yellow fluid.  No immediate complications.  Patient tolerated well.  EBL trace   Specimen was sent for labs.  Heddy Vidana S Raywood Wailes PA-C 05/31/2018 2:12 PM

## 2018-06-02 ENCOUNTER — Inpatient Hospital Stay (HOSPITAL_COMMUNITY): Payer: Medicare Other | Admitting: Certified Registered"

## 2018-06-02 ENCOUNTER — Inpatient Hospital Stay (HOSPITAL_COMMUNITY): Payer: Medicare Other

## 2018-06-02 ENCOUNTER — Inpatient Hospital Stay (HOSPITAL_COMMUNITY)
Admission: EM | Admit: 2018-06-02 | Discharge: 2018-06-06 | DRG: 432 | Disposition: A | Discharge status: Home / Self Care | Payer: Medicare Other | Attending: Internal Medicine | Admitting: Internal Medicine

## 2018-06-02 ENCOUNTER — Other Ambulatory Visit: Payer: Self-pay

## 2018-06-02 ENCOUNTER — Encounter (HOSPITAL_COMMUNITY)
Admission: EM | Disposition: A | Discharge status: Home / Self Care | Payer: Self-pay | Source: Home / Self Care | Attending: Pulmonary Disease

## 2018-06-02 ENCOUNTER — Encounter (HOSPITAL_COMMUNITY): Payer: Self-pay | Admitting: *Deleted

## 2018-06-02 DIAGNOSIS — F17210 Nicotine dependence, cigarettes, uncomplicated: Secondary | ICD-10-CM | POA: Diagnosis present

## 2018-06-02 DIAGNOSIS — R188 Other ascites: Secondary | ICD-10-CM

## 2018-06-02 DIAGNOSIS — R971 Elevated cancer antigen 125 [CA 125]: Secondary | ICD-10-CM | POA: Diagnosis present

## 2018-06-02 DIAGNOSIS — K766 Portal hypertension: Secondary | ICD-10-CM | POA: Diagnosis present

## 2018-06-02 DIAGNOSIS — D696 Thrombocytopenia, unspecified: Secondary | ICD-10-CM | POA: Diagnosis not present

## 2018-06-02 DIAGNOSIS — I8501 Esophageal varices with bleeding: Secondary | ICD-10-CM | POA: Diagnosis present

## 2018-06-02 DIAGNOSIS — K7031 Alcoholic cirrhosis of liver with ascites: Secondary | ICD-10-CM | POA: Diagnosis present

## 2018-06-02 DIAGNOSIS — I81 Portal vein thrombosis: Secondary | ICD-10-CM | POA: Diagnosis present

## 2018-06-02 DIAGNOSIS — G9340 Encephalopathy, unspecified: Secondary | ICD-10-CM | POA: Diagnosis not present

## 2018-06-02 DIAGNOSIS — G8929 Other chronic pain: Secondary | ICD-10-CM | POA: Diagnosis present

## 2018-06-02 DIAGNOSIS — K219 Gastro-esophageal reflux disease without esophagitis: Secondary | ICD-10-CM | POA: Diagnosis present

## 2018-06-02 DIAGNOSIS — F1020 Alcohol dependence, uncomplicated: Secondary | ICD-10-CM | POA: Diagnosis present

## 2018-06-02 DIAGNOSIS — Z91040 Latex allergy status: Secondary | ICD-10-CM

## 2018-06-02 DIAGNOSIS — Z8719 Personal history of other diseases of the digestive system: Secondary | ICD-10-CM

## 2018-06-02 DIAGNOSIS — K3189 Other diseases of stomach and duodenum: Secondary | ICD-10-CM | POA: Diagnosis present

## 2018-06-02 DIAGNOSIS — Z79899 Other long term (current) drug therapy: Secondary | ICD-10-CM | POA: Diagnosis not present

## 2018-06-02 DIAGNOSIS — D689 Coagulation defect, unspecified: Secondary | ICD-10-CM | POA: Diagnosis not present

## 2018-06-02 DIAGNOSIS — K920 Hematemesis: Secondary | ICD-10-CM | POA: Diagnosis not present

## 2018-06-02 DIAGNOSIS — Z882 Allergy status to sulfonamides status: Secondary | ICD-10-CM

## 2018-06-02 DIAGNOSIS — K7290 Hepatic failure, unspecified without coma: Secondary | ICD-10-CM | POA: Diagnosis present

## 2018-06-02 DIAGNOSIS — K7010 Alcoholic hepatitis without ascites: Secondary | ICD-10-CM | POA: Diagnosis present

## 2018-06-02 DIAGNOSIS — D6959 Other secondary thrombocytopenia: Secondary | ICD-10-CM | POA: Diagnosis present

## 2018-06-02 DIAGNOSIS — R58 Hemorrhage, not elsewhere classified: Secondary | ICD-10-CM | POA: Diagnosis not present

## 2018-06-02 DIAGNOSIS — K7460 Unspecified cirrhosis of liver: Secondary | ICD-10-CM | POA: Diagnosis not present

## 2018-06-02 DIAGNOSIS — R578 Other shock: Secondary | ICD-10-CM | POA: Diagnosis present

## 2018-06-02 DIAGNOSIS — R1111 Vomiting without nausea: Secondary | ICD-10-CM | POA: Diagnosis not present

## 2018-06-02 DIAGNOSIS — I8511 Secondary esophageal varices with bleeding: Secondary | ICD-10-CM

## 2018-06-02 DIAGNOSIS — Z91041 Radiographic dye allergy status: Secondary | ICD-10-CM | POA: Diagnosis not present

## 2018-06-02 DIAGNOSIS — K709 Alcoholic liver disease, unspecified: Secondary | ICD-10-CM | POA: Insufficient documentation

## 2018-06-02 DIAGNOSIS — K922 Gastrointestinal hemorrhage, unspecified: Secondary | ICD-10-CM

## 2018-06-02 DIAGNOSIS — I8500 Esophageal varices without bleeding: Secondary | ICD-10-CM | POA: Insufficient documentation

## 2018-06-02 DIAGNOSIS — R1084 Generalized abdominal pain: Secondary | ICD-10-CM | POA: Diagnosis not present

## 2018-06-02 DIAGNOSIS — D62 Acute posthemorrhagic anemia: Secondary | ICD-10-CM | POA: Diagnosis not present

## 2018-06-02 DIAGNOSIS — R1110 Vomiting, unspecified: Secondary | ICD-10-CM | POA: Diagnosis present

## 2018-06-02 DIAGNOSIS — J9690 Respiratory failure, unspecified, unspecified whether with hypoxia or hypercapnia: Secondary | ICD-10-CM | POA: Diagnosis not present

## 2018-06-02 DIAGNOSIS — R52 Pain, unspecified: Secondary | ICD-10-CM | POA: Diagnosis not present

## 2018-06-02 DIAGNOSIS — F17200 Nicotine dependence, unspecified, uncomplicated: Secondary | ICD-10-CM

## 2018-06-02 HISTORY — PX: ESOPHAGEAL BANDING: SHX5518

## 2018-06-02 HISTORY — DX: Esophageal varices without bleeding: I85.00

## 2018-06-02 HISTORY — PX: ESOPHAGOGASTRODUODENOSCOPY (EGD) WITH PROPOFOL: SHX5813

## 2018-06-02 LAB — COMPREHENSIVE METABOLIC PANEL
ALT: 21 U/L (ref 0–44)
AST: 49 U/L — ABNORMAL HIGH (ref 15–41)
Albumin: 2.6 g/dL — ABNORMAL LOW (ref 3.5–5.0)
Alkaline Phosphatase: 152 U/L — ABNORMAL HIGH (ref 38–126)
Anion gap: 14 (ref 5–15)
BUN: 25 mg/dL — ABNORMAL HIGH (ref 6–20)
CO2: 19 mmol/L — ABNORMAL LOW (ref 22–32)
Calcium: 8.8 mg/dL — ABNORMAL LOW (ref 8.9–10.3)
Chloride: 102 mmol/L (ref 98–111)
Creatinine, Ser: 1.45 mg/dL — ABNORMAL HIGH (ref 0.44–1.00)
GFR calc Af Amer: 48 mL/min — ABNORMAL LOW (ref >60)
GFR, EST NON AFRICAN AMERICAN: 41 mL/min — AB (ref >60)
GLUCOSE: 103 mg/dL — AB (ref 70–99)
Potassium: 4.6 mmol/L (ref 3.5–5.1)
Sodium: 135 mmol/L (ref 135–145)
Total Bilirubin: 5.6 mg/dL — ABNORMAL HIGH (ref 0.3–1.2)
Total Protein: 6.2 g/dL — ABNORMAL LOW (ref 6.5–8.1)

## 2018-06-02 LAB — CBC
HCT: 25.4 % — ABNORMAL LOW (ref 36.0–46.0)
Hemoglobin: 8.1 g/dL — ABNORMAL LOW (ref 12.0–15.0)
MCH: 28.2 pg (ref 26.0–34.0)
MCHC: 31.9 g/dL (ref 30.0–36.0)
MCV: 88.5 fL (ref 80.0–100.0)
Platelets: 82 10*3/uL — ABNORMAL LOW (ref 150–400)
RBC: 2.87 MIL/uL — ABNORMAL LOW (ref 3.87–5.11)
RDW: 20.3 % — ABNORMAL HIGH (ref 11.5–15.5)
WBC: 6.1 10*3/uL (ref 4.0–10.5)
nRBC: 0 % (ref 0.0–0.2)

## 2018-06-02 LAB — CBC WITH DIFFERENTIAL/PLATELET
BAND NEUTROPHILS: 0 %
Basophils Absolute: 0.1 10*3/uL (ref 0.0–0.1)
Basophils Relative: 1 %
Blasts: 0 %
Eosinophils Absolute: 0.2 10*3/uL (ref 0.0–0.5)
Eosinophils Relative: 3 %
HCT: 28.2 % — ABNORMAL LOW (ref 36.0–46.0)
Hemoglobin: 9 g/dL — ABNORMAL LOW (ref 12.0–15.0)
LYMPHS ABS: 1.7 10*3/uL (ref 0.7–4.0)
Lymphocytes Relative: 29 %
MCH: 28 pg (ref 26.0–34.0)
MCHC: 31.9 g/dL (ref 30.0–36.0)
MCV: 87.9 fL (ref 80.0–100.0)
MONO ABS: 0.6 10*3/uL (ref 0.1–1.0)
Metamyelocytes Relative: 0 %
Monocytes Relative: 10 %
Myelocytes: 0 %
Neutro Abs: 3.1 10*3/uL (ref 1.7–7.7)
Neutrophils Relative %: 57 %
Other: 0 %
PROMYELOCYTES RELATIVE: 0 %
Platelets: 108 10*3/uL — ABNORMAL LOW (ref 150–400)
RBC: 3.21 MIL/uL — ABNORMAL LOW (ref 3.87–5.11)
RDW: 23.1 % — ABNORMAL HIGH (ref 11.5–15.5)
WBC: 5.7 10*3/uL (ref 4.0–10.5)
nRBC: 0 % (ref 0.0–0.2)
nRBC: 0 /100 WBC

## 2018-06-02 LAB — LIPASE, BLOOD: Lipase: 29 U/L (ref 11–51)

## 2018-06-02 LAB — PROTIME-INR
INR: 1.64
Prothrombin Time: 19.2 seconds — ABNORMAL HIGH (ref 11.4–15.2)

## 2018-06-02 LAB — GLUCOSE, CAPILLARY: Glucose-Capillary: 92 mg/dL (ref 70–99)

## 2018-06-02 LAB — AMMONIA: Ammonia: 97 umol/L — ABNORMAL HIGH (ref 9–35)

## 2018-06-02 LAB — MRSA PCR SCREENING: MRSA by PCR: NEGATIVE

## 2018-06-02 LAB — PREPARE RBC (CROSSMATCH)

## 2018-06-02 SURGERY — ESOPHAGOGASTRODUODENOSCOPY (EGD) WITH PROPOFOL
Anesthesia: General

## 2018-06-02 MED ORDER — LACTATED RINGERS IV SOLN
INTRAVENOUS | Status: DC | PRN
  Administered 2018-06-02: 12:00:00 via INTRAVENOUS

## 2018-06-02 MED ORDER — PHENYLEPHRINE 40 MCG/ML (10ML) SYRINGE FOR IV PUSH (FOR BLOOD PRESSURE SUPPORT)
PREFILLED_SYRINGE | INTRAVENOUS | Status: DC | PRN
  Administered 2018-06-02: 40 ug via INTRAVENOUS
  Administered 2018-06-02 (×2): 80 ug via INTRAVENOUS

## 2018-06-02 MED ORDER — ONDANSETRON HCL 4 MG/2ML IJ SOLN
4.0000 mg | Freq: Four times a day (QID) | INTRAMUSCULAR | Status: DC | PRN
  Administered 2018-06-02 – 2018-06-03 (×2): 4 mg via INTRAVENOUS
  Filled 2018-06-02 (×2): qty 2

## 2018-06-02 MED ORDER — PREDNISOLONE 5 MG PO TABS
40.0000 mg | ORAL_TABLET | Freq: Every day | ORAL | Status: DC
  Administered 2018-06-03 – 2018-06-06 (×4): 40 mg via ORAL
  Filled 2018-06-02 (×5): qty 8

## 2018-06-02 MED ORDER — SODIUM CHLORIDE 0.9 % IV SOLN
8.0000 mg/h | INTRAVENOUS | Status: DC
  Administered 2018-06-02 – 2018-06-04 (×5): 8 mg/h via INTRAVENOUS
  Filled 2018-06-02 (×9): qty 80

## 2018-06-02 MED ORDER — LACTATED RINGERS IV BOLUS
500.0000 mL | Freq: Once | INTRAVENOUS | Status: AC
  Administered 2018-06-02: 500 mL via INTRAVENOUS

## 2018-06-02 MED ORDER — ONDANSETRON HCL 4 MG PO TABS: 4.0000 mg | ORAL_TABLET | Freq: Four times a day (QID) | ORAL | Status: DC | PRN

## 2018-06-02 MED ORDER — NICOTINE 14 MG/24HR TD PT24
14.0000 mg | MEDICATED_PATCH | Freq: Every day | TRANSDERMAL | Status: DC
  Administered 2018-06-02: 14 mg via TRANSDERMAL
  Filled 2018-06-02: qty 1

## 2018-06-02 MED ORDER — SODIUM CHLORIDE 0.9 % IV SOLN
INTRAVENOUS | Status: DC | PRN
  Administered 2018-06-02: 50 ug/min via INTRAVENOUS

## 2018-06-02 MED ORDER — SODIUM CHLORIDE 0.9% IV SOLUTION: Freq: Once | INTRAVENOUS | Status: DC

## 2018-06-02 MED ORDER — OCTREOTIDE LOAD VIA INFUSION
50.0000 ug | Freq: Once | INTRAVENOUS | Status: AC
  Administered 2018-06-02: 50 ug via INTRAVENOUS
  Filled 2018-06-02: qty 25

## 2018-06-02 MED ORDER — ONDANSETRON HCL 4 MG/2ML IJ SOLN
4.0000 mg | Freq: Once | INTRAMUSCULAR | Status: AC
  Administered 2018-06-02: 4 mg via INTRAVENOUS
  Filled 2018-06-02: qty 2

## 2018-06-02 MED ORDER — SODIUM CHLORIDE 0.9 % IV SOLN
50.0000 ug/h | INTRAVENOUS | Status: AC
  Administered 2018-06-02 – 2018-06-04 (×5): 50 ug/h via INTRAVENOUS
  Filled 2018-06-02 (×12): qty 1

## 2018-06-02 MED ORDER — PROPOFOL 500 MG/50ML IV EMUL
INTRAVENOUS | Status: DC | PRN
  Administered 2018-06-02: 100 ug/kg/min via INTRAVENOUS

## 2018-06-02 MED ORDER — KETOROLAC TROMETHAMINE 15 MG/ML IJ SOLN: 15.0000 mg | Freq: Three times a day (TID) | INTRAMUSCULAR | Status: DC | PRN

## 2018-06-02 MED ORDER — PROPOFOL 10 MG/ML IV BOLUS
INTRAVENOUS | Status: DC | PRN
  Administered 2018-06-02: 20 mg via INTRAVENOUS
  Administered 2018-06-02: 70 mg via INTRAVENOUS
  Administered 2018-06-02: 10 mg via INTRAVENOUS

## 2018-06-02 MED ORDER — VITAMIN K1 10 MG/ML IJ SOLN
10.0000 mg | Freq: Every day | INTRAMUSCULAR | Status: AC
  Administered 2018-06-02 – 2018-06-03 (×2): 10 mg via SUBCUTANEOUS
  Filled 2018-06-02 (×5): qty 1

## 2018-06-02 MED ORDER — SODIUM CHLORIDE 0.9 % IV SOLN
80.0000 mg | Freq: Once | INTRAVENOUS | Status: AC
  Administered 2018-06-02: 80 mg via INTRAVENOUS
  Filled 2018-06-02: qty 80

## 2018-06-02 MED ORDER — PREDNISOLONE 5 MG PO TABS: 40.0000 mg | ORAL_TABLET | Freq: Every day | ORAL | Status: DC

## 2018-06-02 MED ORDER — ALBUMIN HUMAN 25 % IV SOLN
50.0000 g | Freq: Once | INTRAVENOUS | Status: AC
  Administered 2018-06-02: 50 g via INTRAVENOUS
  Filled 2018-06-02: qty 200

## 2018-06-02 MED ORDER — SUCCINYLCHOLINE CHLORIDE 200 MG/10ML IV SOSY
PREFILLED_SYRINGE | INTRAVENOUS | Status: DC | PRN
  Administered 2018-06-02: 100 mg via INTRAVENOUS

## 2018-06-02 MED ORDER — RIFAXIMIN 550 MG PO TABS
550.0000 mg | ORAL_TABLET | Freq: Two times a day (BID) | ORAL | Status: DC
  Administered 2018-06-03 – 2018-06-06 (×7): 550 mg via ORAL
  Filled 2018-06-02 (×8): qty 1

## 2018-06-02 MED ORDER — SODIUM CHLORIDE 0.9 % IV SOLN: INTRAVENOUS | Status: DC

## 2018-06-02 MED ORDER — LACTULOSE ENEMA
300.0000 mL | Freq: Two times a day (BID) | ORAL | Status: DC
  Administered 2018-06-02: 300 mL via RECTAL
  Filled 2018-06-02 (×3): qty 300

## 2018-06-02 MED ORDER — PHENYLEPHRINE HCL-NACL 10-0.9 MG/250ML-% IV SOLN
0.0000 ug/min | INTRAVENOUS | Status: DC
  Administered 2018-06-02: 20 ug/min via INTRAVENOUS
  Administered 2018-06-03: 40 ug/min via INTRAVENOUS
  Administered 2018-06-03: 30 ug/min via INTRAVENOUS
  Filled 2018-06-02: qty 250

## 2018-06-02 MED ORDER — LIDOCAINE 2% (20 MG/ML) 5 ML SYRINGE
INTRAMUSCULAR | Status: DC | PRN
  Administered 2018-06-02: 60 mg via INTRAVENOUS

## 2018-06-02 MED ORDER — SODIUM CHLORIDE 0.9 % IV SOLN
1.0000 g | INTRAVENOUS | Status: AC
  Administered 2018-06-02 – 2018-06-04 (×3): 1 g via INTRAVENOUS
  Filled 2018-06-02 (×3): qty 10

## 2018-06-02 MED ORDER — THIAMINE HCL 100 MG/ML IJ SOLN
INTRAVENOUS | Status: AC
  Administered 2018-06-02 – 2018-06-03 (×2): via INTRAVENOUS
  Filled 2018-06-02 (×3): qty 1000

## 2018-06-02 MED ORDER — ONDANSETRON HCL 4 MG/2ML IJ SOLN
INTRAMUSCULAR | Status: DC | PRN
  Administered 2018-06-02: 4 mg via INTRAVENOUS

## 2018-06-02 MED ORDER — LORAZEPAM 2 MG/ML IJ SOLN
2.0000 mg | INTRAMUSCULAR | Status: DC | PRN
  Administered 2018-06-02 – 2018-06-03 (×2): 2 mg via INTRAVENOUS
  Filled 2018-06-02 (×2): qty 1

## 2018-06-02 MED ORDER — SODIUM CHLORIDE 0.9 % IV BOLUS
1000.0000 mL | Freq: Once | INTRAVENOUS | Status: AC
  Administered 2018-06-02: 1000 mL via INTRAVENOUS

## 2018-06-02 SURGICAL SUPPLY — 14 items

## 2018-06-02 NOTE — ED Provider Notes (Addendum)
[facility] EMERGENCY DEPARTMENT Provider Note   CSN: [id number] Arrival date & time: 06/02/18  0507     History   Chief Complaint Chief Complaint  Patient presents with  . Emesis    HPI Katie Holland is a 52 y.o. female.  HPI  This is a 52 year old female with a history of cirrhosis, esophageal varices who presents with hematemesis.  Patient reports she woke up tonight and had 2 episodes of vomiting blood clots and coffee grounds.  Denies any black stools.  Reports that she has some crampy abdominal pain.  It is across her upper abdomen.  She has some abdominal pain at baseline.  She denies any recent fevers.  She had a paracentesis on 12/9 which was uneventful per the patient.  She has not had any fevers.  Is any significant pain at this time.  Of note, most of the history obtained from patient and her daughter.  At times the patient appears confused regarding her history.  She denies at this time chest pain, shortness of breath.  Patient was admitted to the hospital in early November.  At that time she had esophageal banding for bleeding esophageal varices.  She did rebleed once.  She since has had 2 paracenteses.  Past Medical History:  Diagnosis Date  . Alcoholic liver disease ([place])   . Chronic back pain   . Cirrhosis of liver ([place])   . GERD (gastroesophageal reflux disease)   . Jaundice     Patient Active Problem List   Diagnosis Date Noted  . Protein-calorie malnutrition, severe 04/26/2018  . Bleeding esophageal varices ([place])   . Abdominal pain 04/24/2018  . Hematemesis 04/24/2018  . Hematochezia 04/24/2018  . Alcohol abuse 04/24/2018  . Hyponatremia 04/24/2018  . Hypokalemia 04/24/2018  . Metabolic acidosis, normal anion gap (NAG) 04/24/2018  . History of GI bleed 10/26/2017  . Alcoholic cirrhosis of liver with ascites ([place]) 10/26/2017  . Cigarette nicotine dependence without complication 08/67/6195    Past Surgical History:  Procedure  Laterality Date  . BACK SURGERY    . ESOPHAGEAL BANDING  04/25/2018   Procedure: ESOPHAGEAL BANDING;  Surgeon: Yetta Flock, MD;  Location: [facility] ENDOSCOPY;  Service: Gastroenterology;;  . ESOPHAGEAL BANDING  04/27/2018   Procedure: ESOPHAGEAL BANDING;  Surgeon: Milus Banister, MD;  Location: [facility] ENDOSCOPY;  Service: Endoscopy;;  . ESOPHAGOGASTRODUODENOSCOPY N/A 04/27/2018   Procedure: ESOPHAGOGASTRODUODENOSCOPY (EGD);  Surgeon: Milus Banister, MD;  Location: [facility] ENDOSCOPY;  Service: Endoscopy;  Laterality: N/A;  . ESOPHAGOGASTRODUODENOSCOPY (EGD) WITH PROPOFOL N/A 04/25/2018   Procedure: ESOPHAGOGASTRODUODENOSCOPY (EGD) WITH PROPOFOL;  Surgeon: Yetta Flock, MD;  Location: [facility];  Service: Gastroenterology;  Laterality: N/A;  . IR PARACENTESIS  04/26/2018  . IR PARACENTESIS  05/18/2018  . IR PARACENTESIS  05/31/2018     OB History   None      Home Medications    Prior to Admission medications   Medication Sig Start Date End Date Taking? Authorizing Provider  ferrous sulfate 325 (65 FE) MG EC tablet Take 1 tablet (325 mg total) by mouth 2 (two) times daily. 05/03/18 05/03/19  Elgergawy, Silver Huguenin, MD  folic acid (FOLVITE) 1 MG tablet Take 1 tablet (1 mg total) by mouth daily. 05/03/18   Elgergawy, Silver Huguenin, MD  furosemide (LASIX) 20 MG tablet Take 3 tablets (60 mg total) by mouth daily. 05/28/18 06/27/18  Esterwood, Amy S, PA-C  Lactulose 20 GM/30ML SOLN Take 45 mLs (30 g total) by mouth 3 (  three) times daily. Take 30 mls every eight hours. Titrate for 3 bm per 24 hrs 05/27/18   Esterwood, Amy S, PA-C  Multiple Vitamin (MULTIVITAMIN WITH MINERALS) TABS tablet Take 1 tablet by mouth daily. 05/03/18   Elgergawy, Silver Huguenin, MD  pantoprazole (PROTONIX) 40 MG tablet Take 1 tablet (40 mg total) by mouth daily. 10/26/17   Billie Ruddy, MD  potassium chloride (K-DUR) 10 MEQ tablet Take 1 tablet (10 mEq total) by mouth daily. 05/03/18   Elgergawy, Silver Huguenin, MD  prednisoLONE 5 MG  TABS tablet Take 8 tablets (40 mg total) by mouth daily. 05/03/18   Elgergawy, Silver Huguenin, MD  rifaximin (XIFAXAN) 550 MG TABS tablet Take 1 tablet (550 mg total) by mouth 2 (two) times daily. 05/27/18   Esterwood, Amy S, PA-C  spironolactone (ALDACTONE) 100 MG tablet Take 1.5 tablets (150 mg total) by mouth daily. 05/28/18 06/27/18  Esterwood, Amy S, PA-C  thiamine 100 MG tablet Take 1 tablet (100 mg total) by mouth daily. 05/03/18   Elgergawy, Silver Huguenin, MD    Family History No family history on file.  Social History Social History   Tobacco Use  . Smoking status: Current Every Day Smoker  . Smokeless tobacco: Never Used  Substance Use Topics  . Alcohol use: Yes  . Drug use: Not Currently     Allergies   Contrast media [iodinated diagnostic agents]; Latex; and Sulfa antibiotics   Review of Systems Review of Systems  Constitutional: Negative for fever.  Respiratory: Negative for shortness of breath.   Cardiovascular: Negative for chest pain.  Gastrointestinal: Positive for abdominal distention, abdominal pain, nausea and vomiting. Negative for blood in stool and diarrhea.       Hematemesis  Genitourinary: Negative for dysuria.  Neurological: Negative for light-headedness.  Psychiatric/Behavioral: Positive for confusion.  All other systems reviewed and are negative.    Physical Exam Updated Vital Signs BP 110/81   Pulse (!) 108   Temp 98 F (36.7 C) (Oral)   Resp 20   Ht 1.626 m (5\' 4" )   Wt 63 kg   SpO2 100%   BMI 23.84 kg/m   Physical Exam  Constitutional: She is oriented to person, place, and time.  Chronically ill-appearing, nontoxic  HENT:  Head: Normocephalic and atraumatic.  Eyes: Pupils are equal, round, and reactive to light.  Scleral icterus noted  Neck: Neck supple.  Cardiovascular: Regular rhythm and normal heart sounds.  Tachycardia  Pulmonary/Chest: Effort normal and breath sounds normal. No respiratory distress. She has no wheezes.  Abdominal:  Soft. Bowel sounds are normal. She exhibits distension. There is no tenderness. There is no guarding.  Distended abdomen, no rebound or guarding, no significant tenderness to palpation  Musculoskeletal: She exhibits no edema.  Neurological: She is alert and oriented to person, place, and time.  Intermittently confused  Skin: Skin is warm and dry.  Psychiatric: She has a normal mood and affect.  Nursing note and vitals reviewed.    ED Treatments / Results  Labs (all labs ordered are listed, but only abnormal results are displayed) Labs Reviewed  CBC WITH DIFFERENTIAL/PLATELET - Abnormal; Notable for the following components:      Result Value   RBC 3.21 (*)    Hemoglobin 9.0 (*)    HCT 28.2 (*)    RDW 23.1 (*)    Platelets 108 (*)    All other components within normal limits  COMPREHENSIVE METABOLIC PANEL - Abnormal; Notable for the following components:  CO2 19 (*)    Glucose, Bld 103 (*)    BUN 25 (*)    Creatinine, Ser 1.45 (*)    Calcium 8.8 (*)    Total Protein 6.2 (*)    Albumin 2.6 (*)    AST 49 (*)    Alkaline Phosphatase 152 (*)    Total Bilirubin 5.6 (*)    GFR calc non Af Amer 41 (*)    GFR calc Af Amer 48 (*)    All other components within normal limits  AMMONIA - Abnormal; Notable for the following components:   Ammonia 97 (*)    All other components within normal limits  PROTIME-INR - Abnormal; Notable for the following components:   Prothrombin Time 19.2 (*)    All other components within normal limits  LIPASE, BLOOD  TYPE AND SCREEN    EKG None  Radiology Ir Paracentesis  Result Date: 05/31/2018 INDICATION: History of ETOH cirrhosis with recurrent ascites, esophageal varices, GI bleed and encephalopathy. Request for diagnostic and therapeutic paracentesis today. EXAM: ULTRASOUND GUIDED PARACENTESIS MEDICATIONS: 1% lidocaine 10 mL COMPLICATIONS: None immediate. PROCEDURE: Informed written consent was obtained from the patient after a discussion of  the risks, benefits and alternatives to treatment. A timeout was performed prior to the initiation of the procedure. Initial ultrasound scanning demonstrates a large amount of ascites within the right lower abdominal quadrant. The right lower abdomen was prepped and draped in the usual sterile fashion. 1% lidocaine with epinephrine was used for local anesthesia. Following this, a 19 gauge, 7-cm, Yueh catheter was introduced. An ultrasound image was saved for documentation purposes. The paracentesis was performed. The catheter was removed and a dressing was applied. The patient tolerated the procedure well without immediate post procedural complication. FINDINGS: A total of approximately 7 L of clear yellow fluid was removed. Samples were sent to the laboratory as requested by the clinical team. IMPRESSION: Successful ultrasound-guided paracentesis yielding 7 liters of peritoneal fluid. Read by: Gareth Eagle, PA-C Electronically Signed   By: Markus Daft M.D.   On: 05/31/2018 14:12    Procedures Procedures (including critical care time)  CRITICAL CARE Performed by: Merryl Hacker   Total critical care time: 50 minutes  Critical care time was exclusive of separately billable procedures and treating other patients.  Critical care was necessary to treat or prevent imminent or life-threatening deterioration.  Critical care was time spent personally by me on the following activities: development of treatment plan with patient and/or surrogate as well as nursing, discussions with consultants, evaluation of patient's response to treatment, examination of patient, obtaining history from patient or surrogate, ordering and performing treatments and interventions, ordering and review of laboratory studies, ordering and review of radiographic studies, pulse oximetry and re-evaluation of patient's condition.  Angiocath insertion Performed by: Merryl Hacker  Consent: Verbal consent obtained. Risks and  benefits: risks, benefits and alternatives were discussed Time out: Immediately prior to procedure a "time out" was called to verify the correct patient, procedure, equipment, support staff and site/side marked as required.  Preparation: Patient was prepped and draped in the usual sterile fashion.  Vein Location: left EJ   Gauge: 20  Normal blood return and flush without difficulty Patient tolerance: Patient tolerated the procedure well with no immediate complications.    Medications Ordered in ED Medications  octreotide (SANDOSTATIN) 2 mcg/mL load via infusion 50 mcg (50 mcg Intravenous Bolus from Bag 06/02/18 0630)    And  octreotide (SANDOSTATIN) 500 mcg in sodium  chloride 0.9 % 250 mL (2 mcg/mL) infusion (50 mcg/hr Intravenous Rate/Dose Change 06/02/18 0646)  pantoprazole (PROTONIX) 80 mg in sodium chloride 0.9 % 100 mL IVPB (80 mg Intravenous New Bag/Given 06/02/18 0656)  pantoprazole (PROTONIX) 80 mg in sodium chloride 0.9 % 250 mL (0.32 mg/mL) infusion (has no administration in time range)  ondansetron (ZOFRAN) injection 4 mg (4 mg Intravenous Given 06/02/18 0607)  sodium chloride 0.9 % bolus 1,000 mL (0 mLs Intravenous Stopped 06/02/18 0710)     Initial Impression / Assessment and Plan / ED Course  I have reviewed the triage vital signs and the nursing notes.  Pertinent labs & imaging results that were available during my care of the patient were reviewed by me and considered in my medical decision making (see chart for details).  Clinical Course as of Jun 07 653  Wed Jun 02, 2018  0740 Spoke with [facility].  Will be reassessed by gastroenterology.  Currently she is hemodynamically stable.  We will plan for admission to the hospitalist to the stepdown unit.   [CH]    Clinical Course User Index [CH] , Barbette Hair, MD    Patient presents with reported hematemesis at home.  Reports 2 episodes of hematemesis.  She is slightly tachycardic but otherwise her  vital signs are reassuring.  No active vomiting on my initial evaluation.  Abdomen is distended but soft and no significant tenderness.  IVs were placed.  Patient was typed and screened.  She has a history of variceal bleeds recently.  Given confusion, ammonia was also added to work-up.  Patient had no recurrent bleeding while in the emergency department on multiple evaluations.  I did place an EJ for additional IV access.  IV octreotide and Protonix were initiated immediately.  She was typed and screened.  She was given 1 L of fluids for tachycardia.  Lab work notable for hemoglobin of 9.  Last hemoglobin was 11.5 six days ago.  She will need to be admitted for reevaluation by GI.  History is highly suspicious that she may be rebleeding after banding.  At this time no indication for transfusion.  Will consult the hospitalist and [facility].   Final Clinical Impressions(s) / ED Diagnoses   Final diagnoses:  Upper GI bleed  History of esophageal varices    ED Discharge Orders    None       , Barbette Hair, MD 06/02/18 0037    Merryl Hacker, MD 06/07/18 [phone]

## 2018-06-02 NOTE — Transfer of Care (Signed)
Immediate Anesthesia Transfer of Care Note  Patient: Katie Holland  Procedure(s) Performed: ESOPHAGOGASTRODUODENOSCOPY (EGD) WITH PROPOFOL (N/A ) ESOPHAGEAL BANDING  Patient Location: PACU  Anesthesia Type:General  Level of Consciousness: awake and patient cooperative  Airway & Oxygen Therapy: Patient Spontanous Breathing and Patient connected to face mask oxygen  Post-op Assessment: Report given to RN and Post -op Vital signs reviewed and stable  Post vital signs: Reviewed and stable  Last Vitals:  Vitals Value Taken Time  BP 102/57 06/02/2018 12:43 PM  Temp    Pulse 117 06/02/2018 12:46 PM  Resp 55 06/02/2018 12:46 PM  SpO2 100 % 06/02/2018 12:46 PM  Vitals shown include unvalidated device data.  Last Pain:  Vitals:   06/02/18 1125  TempSrc: Oral  PainSc: 8          Complications: No apparent anesthesia complications

## 2018-06-02 NOTE — ED Notes (Signed)
resp rate on admission was 18 not 185

## 2018-06-02 NOTE — H&P (View-Only) (Signed)
Referring Provider:   Primary Care Physician:  Billie Ruddy, MD Primary Gastroenterologist:  Dr. Havery Moros  Reason for Consultation:  Cirrhosis, vomiting blood  HPI: Katie Holland is a 52 y.o. African-American female who was seen in consultation by Dr. Havery Moros during recent hospitalization when she presented with abdominal pain, distention, and hematemesis.  She was admitted on 04/24/2018 with diagnosis of decompensated alcoholic liver disease which had been previously diagnosed in [place], [place].  Her family related that she did have a hospitalization earlier this year in [place] due to the decompensated cirrhosis and ascites and did undergo a couple of paracenteses. During her recent hospitalization at [facility] she had EGD on 04/25/2018 which showed multiple columns of large varices and 7 bands were placed also noted diffuse duodenitis consistent with portal hypertensive changes.  Due to concerns about repeat bleeding she had EGD again on 04/27/2018 per Dr. Ardis Hughs.  There was no blood noted in the esophagus or the stomach, portal gastropathy was noted and she still had large varices so 4 more bands were placed.  She underwent paracentesis on 04/26/2018, then again on 05/02/2018.  She had an ER visit on 05/17/2018 and then subsequently had another paracentesis the following day with removal of 6.5 L of fluid.  She was discharged from the hospital on that occasion with Lasix 80 mg daily and Aldactone 100 mg twice daily, which she reported that she had been taking at her follow-up with one of our APP's last week.  CT imaging was also done during recent hospitalization which did not show any adenopathy but there was a large amount of ascites and mesenteric edema and also noted a somewhat nodular omentum and sampling the ascites was suggested. She has had cell counts, Gram stain's, but cytology was not done.  Since her OV on 12/5 she has undergone another 7 Liter paracentesis.   Fluid studies negative for SBP and cytology showed no malignant cells.  Due to some worsening renal function her lasix was decreased to 60 mg daily and aldactone decreased to 150 mg daily.  Presents to the hospital on this occasion with complaints of vomiting blood and having maroon stools.  She had the BM overnight and that was followed by 2 episodes of hematemesis with clots.  No further vomiting or other BM's since that time.  Nine days ago Hgb was 12.2 grams, then six days ago it was 11.5 grams, and today it was 9.0 grams on admission.  She has been started on PPI gtt and octreotide gtt.  Reports that she had a beer yesterday but that was the first and only since her last hospitalization.  Endoscopic history:  EGD 04/25/2018:  - Z-line regular. - Large esophageal varices with red markings. Banded x 7 with good result - Normal stomach. - Congested duodenal mucosa.  EGD 04/27/2018:  - No recent or old blood in the UGI tract. - There was about 3-400cc of clear liquid in the stomach. - Portal gastropathy changed, mild - The recently placed variceal ligating bands were still in place along the esophagus. There were still several areas of large varices in the distal and mid esophagus and I placed 4 more ligating bands today. Possibly the red blood per rectum this afternoon was old blood finally evacuating. Also may have been from another source, lower in the GI tract.  Past Medical History:  Diagnosis Date  . Alcoholic liver disease ([place])   . Chronic back pain    on disability  .  Cirrhosis of liver ([place])   . Esophageal varices ([place])   . GERD (gastroesophageal reflux disease)   . Jaundice     Past Surgical History:  Procedure Laterality Date  . BACK SURGERY    . ESOPHAGEAL BANDING  04/25/2018   Procedure: ESOPHAGEAL BANDING;  Surgeon: Yetta Flock, MD;  Location: [facility] ENDOSCOPY;  Service: Gastroenterology;;  . ESOPHAGEAL BANDING  04/27/2018   Procedure: ESOPHAGEAL BANDING;   Surgeon: Milus Banister, MD;  Location: [facility] ENDOSCOPY;  Service: Endoscopy;;  . ESOPHAGOGASTRODUODENOSCOPY N/A 04/27/2018   Procedure: ESOPHAGOGASTRODUODENOSCOPY (EGD);  Surgeon: Milus Banister, MD;  Location: [facility] ENDOSCOPY;  Service: Endoscopy;  Laterality: N/A;  . ESOPHAGOGASTRODUODENOSCOPY (EGD) WITH PROPOFOL N/A 04/25/2018   Procedure: ESOPHAGOGASTRODUODENOSCOPY (EGD) WITH PROPOFOL;  Surgeon: Yetta Flock, MD;  Location: [place];  Service: Gastroenterology;  Laterality: N/A;  . IR PARACENTESIS  04/26/2018  . IR PARACENTESIS  05/18/2018  . IR PARACENTESIS  05/31/2018    Prior to Admission medications   Medication Sig Start Date End Date Taking? Authorizing Provider  ferrous sulfate 325 (65 FE) MG EC tablet Take 1 tablet (325 mg total) by mouth 2 (two) times daily. 05/03/18 05/03/19 Yes Elgergawy, Silver Huguenin, MD  folic acid (FOLVITE) 1 MG tablet Take 1 tablet (1 mg total) by mouth daily. 05/03/18  Yes Elgergawy, Silver Huguenin, MD  furosemide (LASIX) 20 MG tablet Take 3 tablets (60 mg total) by mouth daily. 05/28/18 06/27/18 Yes Esterwood, Amy S, PA-C  Lactulose 20 GM/30ML SOLN Take 45 mLs (30 g total) by mouth 3 (three) times daily. Take 30 mls every eight hours. Titrate for 3 bm per 24 hrs 05/27/18  Yes Esterwood, Amy S, PA-C  Multiple Vitamin (MULTIVITAMIN WITH MINERALS) TABS tablet Take 1 tablet by mouth daily. 05/03/18  Yes Elgergawy, Silver Huguenin, MD  pantoprazole (PROTONIX) 40 MG tablet Take 1 tablet (40 mg total) by mouth daily. 10/26/17  Yes Billie Ruddy, MD  potassium chloride (K-DUR) 10 MEQ tablet Take 1 tablet (10 mEq total) by mouth daily. 05/03/18  Yes Elgergawy, Silver Huguenin, MD  prednisoLONE 5 MG TABS tablet Take 8 tablets (40 mg total) by mouth daily. 05/03/18  Yes Elgergawy, Silver Huguenin, MD  rifaximin (XIFAXAN) 550 MG TABS tablet Take 1 tablet (550 mg total) by mouth 2 (two) times daily. 05/27/18  Yes Esterwood, Amy S, PA-C  thiamine 100 MG tablet Take 1 tablet (100 mg total) by mouth  daily. 05/03/18  Yes Elgergawy, Silver Huguenin, MD  spironolactone (ALDACTONE) 100 MG tablet Take 1.5 tablets (150 mg total) by mouth daily. Patient not taking: Reported on 06/02/2018 05/28/18 06/27/18  Alfredia Ferguson, PA-C    Current Facility-Administered Medications  Medication Dose Route Frequency Provider Last Rate Last Dose  . octreotide (SANDOSTATIN) 500 mcg in sodium chloride 0.9 % 250 mL (2 mcg/mL) infusion  50 mcg/hr Intravenous Continuous Merryl Hacker, MD 25 mL/hr at 06/02/18 0646 50 mcg/hr at 06/02/18 0646  . pantoprazole (PROTONIX) 80 mg in sodium chloride 0.9 % 250 mL (0.32 mg/mL) infusion  8 mg/hr Intravenous Continuous Horton, Barbette Hair, MD 25 mL/hr at 06/02/18 0805 8 mg/hr at 06/02/18 0805   Current Outpatient Medications  Medication Sig Dispense Refill  . ferrous sulfate 325 (65 FE) MG EC tablet Take 1 tablet (325 mg total) by mouth 2 (two) times daily. 60 tablet 0  . folic acid (FOLVITE) 1 MG tablet Take 1 tablet (1 mg total) by mouth daily. 30 tablet 0  . furosemide (LASIX) 20  MG tablet Take 3 tablets (60 mg total) by mouth daily. 90 tablet 1  . Lactulose 20 GM/30ML SOLN Take 45 mLs (30 g total) by mouth 3 (three) times daily. Take 30 mls every eight hours. Titrate for 3 bm per 24 hrs 1892 mL 6  . Multiple Vitamin (MULTIVITAMIN WITH MINERALS) TABS tablet Take 1 tablet by mouth daily. 30 tablet 0  . pantoprazole (PROTONIX) 40 MG tablet Take 1 tablet (40 mg total) by mouth daily. 30 tablet 3  . potassium chloride (K-DUR) 10 MEQ tablet Take 1 tablet (10 mEq total) by mouth daily. 30 tablet 0  . prednisoLONE 5 MG TABS tablet Take 8 tablets (40 mg total) by mouth daily. 240 each 0  . rifaximin (XIFAXAN) 550 MG TABS tablet Take 1 tablet (550 mg total) by mouth 2 (two) times daily. 60 tablet 11  . thiamine 100 MG tablet Take 1 tablet (100 mg total) by mouth daily. 30 tablet 0  . spironolactone (ALDACTONE) 100 MG tablet Take 1.5 tablets (150 mg total) by mouth daily. (Patient not  taking: Reported on 06/02/2018) 45 tablet 1    Allergies as of 06/02/2018 - Review Complete 06/02/2018  Allergen Reaction Noted  . Contrast media [iodinated diagnostic agents] Itching and Swelling 10/26/2017  . Latex Itching and Swelling 10/26/2017  . Sulfa antibiotics Itching and Swelling 10/26/2017    History reviewed. No pertinent family history.  Social History   Socioeconomic History  . Marital status: Single    Spouse name: Not on file  . Number of children: Not on file  . Years of education: Not on file  . Highest education level: Not on file  Occupational History  . Occupation: disability  Social Needs  . Financial resource strain: Not on file  . Food insecurity:    Worry: Not on file    Inability: Not on file  . Transportation needs:    Medical: Not on file    Non-medical: Not on file  Tobacco Use  . Smoking status: Current Every Day Smoker    Packs/day: 0.75    Years: 20.00    Pack years: 15.00  . Smokeless tobacco: Never Used  Substance and Sexual Activity  . Alcohol use: Yes    Comment: denies h/o withdrawal  . Drug use: Not Currently  . Sexual activity: Not on file  Lifestyle  . Physical activity:    Days per week: Not on file    Minutes per session: Not on file  . Stress: Not on file  Relationships  . Social connections:    Talks on phone: Not on file    Gets together: Not on file    Attends religious service: Not on file    Active member of club or organization: Not on file    Attends meetings of clubs or organizations: Not on file    Relationship status: Not on file  . Intimate partner violence:    Fear of current or ex partner: Not on file    Emotionally abused: Not on file    Physically abused: Not on file    Forced sexual activity: Not on file  Other Topics Concern  . Not on file  Social History Narrative  . Not on file    Review of Systems: ROS is O/W negative except as mentioned in HPI.  Physical Exam: Vital signs in last 24  hours: Temp:  [98 F (36.7 C)] 98 F (36.7 C) (12/11 0519) Pulse Rate:  [95-109] 105 (12/11 0730)  Resp:  [11-185] 17 (12/11 0730) BP: (101-121)/(59-90) 113/66 (12/11 0730) SpO2:  [100 %] 100 % (12/11 0730) Weight:  [32 kg] 63 kg (12/11 0520)   General:  Alert, Well-developed, well-nourished, pleasant and cooperative in NAD Head:  Normocephalic and atraumatic.  Has some temporal wasting. Eyes:  Sclera mildly icteric. Ears:  Normal auditory acuity. Mouth:  No deformity or lesions.   Lungs:  Clear throughout to auscultation.  No wheezes, crackles, or rhonchi.  Heart:  Tachy.  No murmurs noted. Abdomen:  Soft, distended with ascites fluid.  BS present.  Non-tender.   Msk:  Symmetrical without gross deformities. Pulses:  Normal pulses noted. Extremities:  Without clubbing or edema. Neurologic:  Alert and oriented x 4;  grossly normal neurologically. Skin:  Intact without significant lesions or rashes. Psych:  Alert and cooperative. Normal mood and affect.  Intake/Output this shift: Total I/O In: 93.8 [IV Piggyback:93.8] Out: -   Lab Results: Recent Labs    06/02/18 0540  WBC 5.7  HGB 9.0*  HCT 28.2*  PLT 108*   BMET Recent Labs    06/02/18 0540  NA 135  K 4.6  CL 102  CO2 19*  GLUCOSE 103*  BUN 25*  CREATININE 1.45*  CALCIUM 8.8*   LFT Recent Labs    06/02/18 0540  PROT 6.2*  ALBUMIN 2.6*  AST 49*  ALT 21  ALKPHOS 152*  BILITOT 5.6*   PT/INR Recent Labs    06/02/18 0540  LABPROT 19.2*  INR 1.64   Studies/Results: Ir Paracentesis  Result Date: 05/31/2018 INDICATION: History of ETOH cirrhosis with recurrent ascites, esophageal varices, GI bleed and encephalopathy. Request for diagnostic and therapeutic paracentesis today. EXAM: ULTRASOUND GUIDED PARACENTESIS MEDICATIONS: 1% lidocaine 10 mL COMPLICATIONS: None immediate. PROCEDURE: Informed written consent was obtained from the patient after a discussion of the risks, benefits and alternatives to  treatment. A timeout was performed prior to the initiation of the procedure. Initial ultrasound scanning demonstrates a large amount of ascites within the right lower abdominal quadrant. The right lower abdomen was prepped and draped in the usual sterile fashion. 1% lidocaine with epinephrine was used for local anesthesia. Following this, a 19 gauge, 7-cm, Yueh catheter was introduced. An ultrasound image was saved for documentation purposes. The paracentesis was performed. The catheter was removed and a dressing was applied. The patient tolerated the procedure well without immediate post procedural complication. FINDINGS: A total of approximately 7 L of clear yellow fluid was removed. Samples were sent to the laboratory as requested by the clinical team. IMPRESSION: Successful ultrasound-guided paracentesis yielding 7 liters of peritoneal fluid. Read by: Gareth Eagle, PA-C Electronically Signed   By: Markus Daft M.D.   On: 05/31/2018 14:12   IMPRESSION:  *ETOH cirrhosis complicated by ascites, HE, bleeding esophageal varices, thrombocytopenia, and mild coagulopathy. *Variceal bleed with large esophageal varices with 10 bands total placed last month:  Presented on this occasion with 2 episodes of hematemesis.  Is on PPI gtt and octreotide gtt. *Acute blood loss anemia:  Hgb down about about 3 grams in the past 9 days. *Ascites:  S/p 7 Liter paracentesis on 12/9.  Cytology negative for malignant cells and studies negative for SBP.  Is on lasix 60 mg daily and aldactone 150 mg daily at home currently. *HE:  On lactulose 30 grams TID and xifaxan 550 mg BID at home. *ETOH abuse:  Needs abstinence.  Strongly encouraged to seek professional counseling. *Elevated CA 125 at 485  PLAN: *I have started  IV Rocephin. *Hopefully EGD later this afternoon with Dr. Loletha Carrow. *Monitor Hgb and transfuse prn.   Katie Holland. Katie Holland  06/02/2018, 8:59 AM

## 2018-06-02 NOTE — Progress Notes (Signed)
  I received a call from nursing about this patient, vomited up some dark red blood clot this evening. Vitals appear mostly stable compared to previous today. Recommend sending stat CBC to see where this is trending. Unclear if she is acutely rebleeding or passed old blood from her stomach as she had a significant burden of blood in her stomach during EGD this AM.  I spoke with Dr. Loletha Carrow who performed her EGD this AM. He banded the varices in her esophagus, does not appear to have much room left for additional banding after his therapy. In light of her recurrent bleeding, with 2 variceal bleeds in the past month, TIPS may be the best option to provide therapy for her varices and her severe ascites moving forward. I have called Dr. Kathlene Cote this evening and discussed her case.   If she has significant rebleeding this evening, TIPS would be the recommended modality to treat this acutely. She does have some reported history of encephalopathy but I think has been mild, I think she is still a candidate for TIPS. I would continue PPI, octreotide, and NPO tonight. If she is stable overnight, IR will evaluate her tomorrow for possible TIPS. Please contact me if she has any rebleeding overnight. Our service will otherwise reassess her in the morning.   [place] Cellar, MD [facility] Gastroenterology

## 2018-06-02 NOTE — Anesthesia Procedure Notes (Signed)
Procedure Name: Intubation Date/Time: 06/02/2018 12:12 PM Performed by: Orlie Dakin, CRNA Pre-anesthesia Checklist: Patient identified, Emergency Drugs available, Suction available, Patient being monitored and Timeout performed Patient Re-evaluated:Patient Re-evaluated prior to induction Oxygen Delivery Method: Circle system utilized Preoxygenation: Pre-oxygenation with 100% oxygen Induction Type: IV induction, Rapid sequence and Cricoid Pressure applied Ventilation: Mask ventilation without difficulty Laryngoscope Size: Mac and 3 Grade View: Grade I Tube type: Oral Tube size: 7.0 mm Number of attempts: 1 Airway Equipment and Method: Stylet Placement Confirmation: ETT inserted through vocal cords under direct vision,  positive ETCO2,  CO2 detector and breath sounds checked- equal and bilateral Secured at: 21 cm Tube secured with: Tape Dental Injury: Teeth and Oropharynx as per pre-operative assessment

## 2018-06-02 NOTE — H&P (Signed)
NAME:  Katie Holland, MRN:  [id number], DOB:  04-May-1966, LOS: 0 ADMISSION DATE:  06/02/2018, CONSULTATION DATE:  06/02/18 REFERRING MD:  Madelon Lips MD, CHIEF COMPLAINT:  GI bleed  Brief History   52 year old with alcohol cirrhosis, esophageal varices presenting with upper GI bleed Underwent EGD, banding.  Became hypotensive during procedure requiring Neo-Synephrine.  PCCM requested to admit to ICU for monitoring.  History of present illness   Complains of vomiting blood overnight.  Reports to episodes of emesis with bright red blood.  Trace history of similar episodes in the past with banding done 11 3 and 11 5.  Past Medical History  Alcohol cirrhosis, esophageal varices, GERD, chronic back pain Considering possible peritoneal carcinomatosis.  However ascites cytology is benign.  Significant Hospital Events   12/11-admit, EGD with banding of varices.  Consults:  GI 12/11-  Procedures:  12/11-EGD  Significant Diagnostic Tests:    Micro Data:    Antimicrobials:  Ceftriaxone 12/11>>   Interim history/subjective:    Objective   Blood pressure 98/64, pulse (!) 113, temperature 98.1 F (36.7 C), resp. rate (!) 41, height 5\' 4"  (1.626 m), weight 63 kg, SpO2 99 %.        Intake/Output Summary (Last 24 hours) at 06/02/2018 1324 Last data filed at 06/02/2018 1245 Gross per 24 hour  Intake 373.77 ml  Output -  Net 373.77 ml   Filed Weights   06/02/18 0520 06/02/18 1125  Weight: 63 kg 63 kg    Examination: Gen:      No acute distress HEENT:  EOMI, sclera + icterus Neck:     No masses; no thyromegaly Lungs:    Clear to auscultation bilaterally; normal respiratory effort CV:         Regular rate and rhythm; no murmurs Abd:      + bowel sounds; soft, non-tender; no palpable masses, no distension Ext:    No edema; adequate peripheral perfusion Skin:      Warm and dry; no rash Neuro: alert and oriented x 3  Resolved Hospital Problem list     Assessment & Plan:    Acute variceal bleed status post banding Admit to ICU Currently getting 1 unit PRBC Follow CBC.  Transfuse for hemoglobin less than 7 Octreotide, Protonix drip Empiric ceftriaxone  Hemorrhagic shock Required Neo-Synephrine during procedure Off pressors May need CVL if she becomes unstable again  EtOH cirrhosis Portal hypertension. Ascites Undergoes regular large-volume paracentesis.  Last tap was on 12/9.  Hepatic Encephalopathy Lactulose, rifaximin.  Best practice:  Diet: N.p.o. Pain/Anxiety/Delirium protocol (if indicated): NA VAP protocol (if indicated): NA DVT prophylaxis: SCDs GI prophylaxis: PPI drip Glucose control: NA Mobility: Bed Code Status: Full Family Communication:  Disposition: ICU  Labs   CBC: Recent Labs  Lab 05/27/18 1514 06/02/18 0540  WBC 4.8 5.7  NEUTROABS 3.0 3.1  HGB 11.5* 9.0*  HCT 35.3* 28.2*  MCV 90.3 87.9  PLT 148.0* 108*    Basic Metabolic Panel: Recent Labs  Lab 05/27/18 1514 06/02/18 0540  NA 134* 135  K 4.4 4.6  CL 101 102  CO2 23 19*  GLUCOSE 135* 103*  BUN 25* 25*  CREATININE 1.92* 1.45*  CALCIUM 9.0 8.8*   GFR: Estimated Creatinine Clearance: 38.7 mL/min (A) (by C-G formula based on SCr of 1.45 mg/dL (H)). Recent Labs  Lab 05/27/18 1514 06/02/18 0540  WBC 4.8 5.7    Liver Function Tests: Recent Labs  Lab 05/27/18 1514 06/02/18 0540  AST 50* 49*  ALT 28 21  ALKPHOS 212* 152*  BILITOT 7.0* 5.6*  PROT 6.9 6.2*  ALBUMIN 2.8* 2.6*   Recent Labs  Lab 06/02/18 0540  LIPASE 29   Recent Labs  Lab 05/27/18 1514 06/02/18 0540  AMMONIA 95* 97*    ABG No results found for: PHART, PCO2ART, PO2ART, HCO3, TCO2, ACIDBASEDEF, O2SAT   Coagulation Profile: Recent Labs  Lab 05/27/18 1514 06/02/18 0540  INR 1.6* 1.64    Cardiac Enzymes: No results for input(s): CKTOTAL, CKMB, CKMBINDEX, TROPONINI in the last 168 hours.  HbA1C: No results found for: HGBA1C  CBG: No results for input(s): GLUCAP  in the last 168 hours.  Review of Systems:    All negative; except for those that are bolded, which indicate positives.  Constitutional: weight loss, weight gain, night sweats, fevers, chills, fatigue, weakness.  HEENT: headaches, sore throat, sneezing, nasal congestion, post nasal drip, difficulty swallowing, tooth/dental problems, visual complaints, visual changes, ear aches. Neuro: difficulty with speech, weakness, numbness, ataxia. CV:  chest pain, orthopnea, PND, swelling in lower extremities, dizziness, palpitations, syncope.  Resp: cough, hemoptysis, dyspnea, wheezing. GI: heartburn, indigestion, abdominal pain, nausea, vomiting, diarrhea, constipation, change in bowel habits, loss of appetite, hematemesis, melena, hematochezia.  GU: dysuria, change in color of urine, urgency or frequency, flank pain, hematuria. MSK: joint pain or swelling, decreased range of motion. Psych: change in mood or affect, depression, anxiety, suicidal ideations, homicidal ideations. Skin: rash, itching, bruising.  Past Medical History  She,  has a past medical history of Alcoholic liver disease ([place]), Chronic back pain, Cirrhosis of liver ([place]), Esophageal varices ([place]), GERD (gastroesophageal reflux disease), and Jaundice.   Surgical History    Past Surgical History:  Procedure Laterality Date  . BACK SURGERY    . ESOPHAGEAL BANDING  04/25/2018   Procedure: ESOPHAGEAL BANDING;  Surgeon: Yetta Flock, MD;  Location: [facility] ENDOSCOPY;  Service: Gastroenterology;;  . ESOPHAGEAL BANDING  04/27/2018   Procedure: ESOPHAGEAL BANDING;  Surgeon: Milus Banister, MD;  Location: [facility] ENDOSCOPY;  Service: Endoscopy;;  . ESOPHAGOGASTRODUODENOSCOPY N/A 04/27/2018   Procedure: ESOPHAGOGASTRODUODENOSCOPY (EGD);  Surgeon: Milus Banister, MD;  Location: [facility] ENDOSCOPY;  Service: Endoscopy;  Laterality: N/A;  . ESOPHAGOGASTRODUODENOSCOPY (EGD) WITH PROPOFOL N/A 04/25/2018   Procedure: ESOPHAGOGASTRODUODENOSCOPY (EGD)  WITH PROPOFOL;  Surgeon: Yetta Flock, MD;  Location: [facility];  Service: Gastroenterology;  Laterality: N/A;  . IR PARACENTESIS  04/26/2018  . IR PARACENTESIS  05/18/2018  . IR PARACENTESIS  05/31/2018     Social History   reports that she has been smoking. She has a 15.00 pack-year smoking history. She has never used smokeless tobacco. She reports that she drinks alcohol. She reports that she has current or past drug history.   Family History   Her family history is not on file.   Allergies Allergies  Allergen Reactions  . Contrast Media [Iodinated Diagnostic Agents] Itching and Swelling  . Latex Itching and Swelling    No breathing impairment, however  . Sulfa Antibiotics Itching and Swelling    No breathing impairment, however     Home Medications  Prior to Admission medications   Medication Sig Start Date End Date Taking? Authorizing Provider  ferrous sulfate 325 (65 FE) MG EC tablet Take 1 tablet (325 mg total) by mouth 2 (two) times daily. 05/03/18 05/03/19 Yes Elgergawy, Silver Huguenin, MD  folic acid (FOLVITE) 1 MG tablet Take 1 tablet (1 mg total) by mouth daily. 05/03/18  Yes Elgergawy, Silver Huguenin, MD  furosemide (  LASIX) 20 MG tablet Take 3 tablets (60 mg total) by mouth daily. 05/28/18 06/27/18 Yes Esterwood, Amy S, PA-C  Lactulose 20 GM/30ML SOLN Take 45 mLs (30 g total) by mouth 3 (three) times daily. Take 30 mls every eight hours. Titrate for 3 bm per 24 hrs 05/27/18  Yes Esterwood, Amy S, PA-C  Multiple Vitamin (MULTIVITAMIN WITH MINERALS) TABS tablet Take 1 tablet by mouth daily. 05/03/18  Yes Elgergawy, Silver Huguenin, MD  pantoprazole (PROTONIX) 40 MG tablet Take 1 tablet (40 mg total) by mouth daily. 10/26/17  Yes Billie Ruddy, MD  potassium chloride (K-DUR) 10 MEQ tablet Take 1 tablet (10 mEq total) by mouth daily. 05/03/18  Yes Elgergawy, Silver Huguenin, MD  prednisoLONE 5 MG TABS tablet Take 8 tablets (40 mg total) by mouth daily. 05/03/18  Yes Elgergawy, Silver Huguenin, MD    rifaximin (XIFAXAN) 550 MG TABS tablet Take 1 tablet (550 mg total) by mouth 2 (two) times daily. 05/27/18  Yes Esterwood, Amy S, PA-C  thiamine 100 MG tablet Take 1 tablet (100 mg total) by mouth daily. 05/03/18  Yes Elgergawy, Silver Huguenin, MD  spironolactone (ALDACTONE) 100 MG tablet Take 1.5 tablets (150 mg total) by mouth daily. Patient not taking: Reported on 06/02/2018 05/28/18 06/27/18  Alfredia Ferguson, PA-C    The patient is critically ill with multiple organ system failure and requires high complexity decision making for assessment and support, frequent evaluation and titration of therapies, advanced monitoring, review of radiographic studies and interpretation of complex data.   Critical Care Time devoted to patient care services, exclusive of separately billable procedures, described in this note is 35 minutes.   Marshell Garfinkel MD [facility] Pager [phone] If no answer call: [phone] 06/02/2018, 1:32 PM

## 2018-06-02 NOTE — Consult Note (Signed)
Referring Provider:   Primary Care Physician:  Billie Ruddy, MD Primary Gastroenterologist:  Dr. Havery Moros  Reason for Consultation:  Cirrhosis, vomiting blood  HPI: Katie Holland is a 52 y.o. African-American female who was seen in consultation by Dr. Havery Moros during recent hospitalization when she presented with abdominal pain, distention, and hematemesis.  She was admitted on 04/24/2018 with diagnosis of decompensated alcoholic liver disease which had been previously diagnosed in [place], [place].  Her family related that she did have a hospitalization earlier this year in [place] due to the decompensated cirrhosis and ascites and did undergo a couple of paracenteses. During her recent hospitalization at [facility] she had EGD on 04/25/2018 which showed multiple columns of large varices and 7 bands were placed also noted diffuse duodenitis consistent with portal hypertensive changes.  Due to concerns about repeat bleeding she had EGD again on 04/27/2018 per Dr. Ardis Hughs.  There was no blood noted in the esophagus or the stomach, portal gastropathy was noted and she still had large varices so 4 more bands were placed.  She underwent paracentesis on 04/26/2018, then again on 05/02/2018.  She had an ER visit on 05/17/2018 and then subsequently had another paracentesis the following day with removal of 6.5 L of fluid.  She was discharged from the hospital on that occasion with Lasix 80 mg daily and Aldactone 100 mg twice daily, which she reported that she had been taking at her follow-up with one of our APP's last week.  CT imaging was also done during recent hospitalization which did not show any adenopathy but there was a large amount of ascites and mesenteric edema and also noted a somewhat nodular omentum and sampling the ascites was suggested. She has had cell counts, Gram stain's, but cytology was not done.  Since her OV on 12/5 she has undergone another 7 Liter paracentesis.   Fluid studies negative for SBP and cytology showed no malignant cells.  Due to some worsening renal function her lasix was decreased to 60 mg daily and aldactone decreased to 150 mg daily.  Presents to the hospital on this occasion with complaints of vomiting blood and having maroon stools.  She had the BM overnight and that was followed by 2 episodes of hematemesis with clots.  No further vomiting or other BM's since that time.  Nine days ago Hgb was 12.2 grams, then six days ago it was 11.5 grams, and today it was 9.0 grams on admission.  She has been started on PPI gtt and octreotide gtt.  Reports that she had a beer yesterday but that was the first and only since her last hospitalization.  Endoscopic history:  EGD 04/25/2018:  - Z-line regular. - Large esophageal varices with red markings. Banded x 7 with good result - Normal stomach. - Congested duodenal mucosa.  EGD 04/27/2018:  - No recent or old blood in the UGI tract. - There was about 3-400cc of clear liquid in the stomach. - Portal gastropathy changed, mild - The recently placed variceal ligating bands were still in place along the esophagus. There were still several areas of large varices in the distal and mid esophagus and I placed 4 more ligating bands today. Possibly the red blood per rectum this afternoon was old blood finally evacuating. Also may have been from another source, lower in the GI tract.  Past Medical History:  Diagnosis Date  . Alcoholic liver disease ([place])   . Chronic back pain    on disability  .  Cirrhosis of liver ([place])   . Esophageal varices ([place])   . GERD (gastroesophageal reflux disease)   . Jaundice     Past Surgical History:  Procedure Laterality Date  . BACK SURGERY    . ESOPHAGEAL BANDING  04/25/2018   Procedure: ESOPHAGEAL BANDING;  Surgeon: Yetta Flock, MD;  Location: [facility] ENDOSCOPY;  Service: Gastroenterology;;  . ESOPHAGEAL BANDING  04/27/2018   Procedure: ESOPHAGEAL BANDING;   Surgeon: Milus Banister, MD;  Location: [facility] ENDOSCOPY;  Service: Endoscopy;;  . ESOPHAGOGASTRODUODENOSCOPY N/A 04/27/2018   Procedure: ESOPHAGOGASTRODUODENOSCOPY (EGD);  Surgeon: Milus Banister, MD;  Location: [facility] ENDOSCOPY;  Service: Endoscopy;  Laterality: N/A;  . ESOPHAGOGASTRODUODENOSCOPY (EGD) WITH PROPOFOL N/A 04/25/2018   Procedure: ESOPHAGOGASTRODUODENOSCOPY (EGD) WITH PROPOFOL;  Surgeon: Yetta Flock, MD;  Location: [facility];  Service: Gastroenterology;  Laterality: N/A;  . IR PARACENTESIS  04/26/2018  . IR PARACENTESIS  05/18/2018  . IR PARACENTESIS  05/31/2018    Prior to Admission medications   Medication Sig Start Date End Date Taking? Authorizing Provider  ferrous sulfate 325 (65 FE) MG EC tablet Take 1 tablet (325 mg total) by mouth 2 (two) times daily. 05/03/18 05/03/19 Yes Elgergawy, Silver Huguenin, MD  folic acid (FOLVITE) 1 MG tablet Take 1 tablet (1 mg total) by mouth daily. 05/03/18  Yes Elgergawy, Silver Huguenin, MD  furosemide (LASIX) 20 MG tablet Take 3 tablets (60 mg total) by mouth daily. 05/28/18 06/27/18 Yes Esterwood, Amy S, PA-C  Lactulose 20 GM/30ML SOLN Take 45 mLs (30 g total) by mouth 3 (three) times daily. Take 30 mls every eight hours. Titrate for 3 bm per 24 hrs 05/27/18  Yes Esterwood, Amy S, PA-C  Multiple Vitamin (MULTIVITAMIN WITH MINERALS) TABS tablet Take 1 tablet by mouth daily. 05/03/18  Yes Elgergawy, Silver Huguenin, MD  pantoprazole (PROTONIX) 40 MG tablet Take 1 tablet (40 mg total) by mouth daily. 10/26/17  Yes Billie Ruddy, MD  potassium chloride (K-DUR) 10 MEQ tablet Take 1 tablet (10 mEq total) by mouth daily. 05/03/18  Yes Elgergawy, Silver Huguenin, MD  prednisoLONE 5 MG TABS tablet Take 8 tablets (40 mg total) by mouth daily. 05/03/18  Yes Elgergawy, Silver Huguenin, MD  rifaximin (XIFAXAN) 550 MG TABS tablet Take 1 tablet (550 mg total) by mouth 2 (two) times daily. 05/27/18  Yes Esterwood, Amy S, PA-C  thiamine 100 MG tablet Take 1 tablet (100 mg total) by mouth  daily. 05/03/18  Yes Elgergawy, Silver Huguenin, MD  spironolactone (ALDACTONE) 100 MG tablet Take 1.5 tablets (150 mg total) by mouth daily. Patient not taking: Reported on 06/02/2018 05/28/18 06/27/18  Alfredia Ferguson, PA-C    Current Facility-Administered Medications  Medication Dose Route Frequency Provider Last Rate Last Dose  . octreotide (SANDOSTATIN) 500 mcg in sodium chloride 0.9 % 250 mL (2 mcg/mL) infusion  50 mcg/hr Intravenous Continuous Merryl Hacker, MD 25 mL/hr at 06/02/18 0646 50 mcg/hr at 06/02/18 0646  . pantoprazole (PROTONIX) 80 mg in sodium chloride 0.9 % 250 mL (0.32 mg/mL) infusion  8 mg/hr Intravenous Continuous Horton, Barbette Hair, MD 25 mL/hr at 06/02/18 0805 8 mg/hr at 06/02/18 0805   Current Outpatient Medications  Medication Sig Dispense Refill  . ferrous sulfate 325 (65 FE) MG EC tablet Take 1 tablet (325 mg total) by mouth 2 (two) times daily. 60 tablet 0  . folic acid (FOLVITE) 1 MG tablet Take 1 tablet (1 mg total) by mouth daily. 30 tablet 0  . furosemide (LASIX) 20  MG tablet Take 3 tablets (60 mg total) by mouth daily. 90 tablet 1  . Lactulose 20 GM/30ML SOLN Take 45 mLs (30 g total) by mouth 3 (three) times daily. Take 30 mls every eight hours. Titrate for 3 bm per 24 hrs 1892 mL 6  . Multiple Vitamin (MULTIVITAMIN WITH MINERALS) TABS tablet Take 1 tablet by mouth daily. 30 tablet 0  . pantoprazole (PROTONIX) 40 MG tablet Take 1 tablet (40 mg total) by mouth daily. 30 tablet 3  . potassium chloride (K-DUR) 10 MEQ tablet Take 1 tablet (10 mEq total) by mouth daily. 30 tablet 0  . prednisoLONE 5 MG TABS tablet Take 8 tablets (40 mg total) by mouth daily. 240 each 0  . rifaximin (XIFAXAN) 550 MG TABS tablet Take 1 tablet (550 mg total) by mouth 2 (two) times daily. 60 tablet 11  . thiamine 100 MG tablet Take 1 tablet (100 mg total) by mouth daily. 30 tablet 0  . spironolactone (ALDACTONE) 100 MG tablet Take 1.5 tablets (150 mg total) by mouth daily. (Patient not  taking: Reported on 06/02/2018) 45 tablet 1    Allergies as of 06/02/2018 - Review Complete 06/02/2018  Allergen Reaction Noted  . Contrast media [iodinated diagnostic agents] Itching and Swelling 10/26/2017  . Latex Itching and Swelling 10/26/2017  . Sulfa antibiotics Itching and Swelling 10/26/2017    History reviewed. No pertinent family history.  Social History   Socioeconomic History  . Marital status: Single    Spouse name: Not on file  . Number of children: Not on file  . Years of education: Not on file  . Highest education level: Not on file  Occupational History  . Occupation: disability  Social Needs  . Financial resource strain: Not on file  . Food insecurity:    Worry: Not on file    Inability: Not on file  . Transportation needs:    Medical: Not on file    Non-medical: Not on file  Tobacco Use  . Smoking status: Current Every Day Smoker    Packs/day: 0.75    Years: 20.00    Pack years: 15.00  . Smokeless tobacco: Never Used  Substance and Sexual Activity  . Alcohol use: Yes    Comment: denies h/o withdrawal  . Drug use: Not Currently  . Sexual activity: Not on file  Lifestyle  . Physical activity:    Days per week: Not on file    Minutes per session: Not on file  . Stress: Not on file  Relationships  . Social connections:    Talks on phone: Not on file    Gets together: Not on file    Attends religious service: Not on file    Active member of club or organization: Not on file    Attends meetings of clubs or organizations: Not on file    Relationship status: Not on file  . Intimate partner violence:    Fear of current or ex partner: Not on file    Emotionally abused: Not on file    Physically abused: Not on file    Forced sexual activity: Not on file  Other Topics Concern  . Not on file  Social History Narrative  . Not on file    Review of Systems: ROS is O/W negative except as mentioned in HPI.  Physical Exam: Vital signs in last 24  hours: Temp:  [98 F (36.7 C)] 98 F (36.7 C) (12/11 0519) Pulse Rate:  [95-109] 105 (12/11 0730)  Resp:  [11-185] 17 (12/11 0730) BP: (101-121)/(59-90) 113/66 (12/11 0730) SpO2:  [100 %] 100 % (12/11 0730) Weight:  [48 kg] 63 kg (12/11 0520)   General:  Alert, Well-developed, well-nourished, pleasant and cooperative in NAD Head:  Normocephalic and atraumatic.  Has some temporal wasting. Eyes:  Sclera mildly icteric. Ears:  Normal auditory acuity. Mouth:  No deformity or lesions.   Lungs:  Clear throughout to auscultation.  No wheezes, crackles, or rhonchi.  Heart:  Tachy.  No murmurs noted. Abdomen:  Soft, distended with ascites fluid.  BS present.  Non-tender.   Msk:  Symmetrical without gross deformities. Pulses:  Normal pulses noted. Extremities:  Without clubbing or edema. Neurologic:  Alert and oriented x 4;  grossly normal neurologically. Skin:  Intact without significant lesions or rashes. Psych:  Alert and cooperative. Normal mood and affect.  Intake/Output this shift: Total I/O In: 93.8 [IV Piggyback:93.8] Out: -   Lab Results: Recent Labs    06/02/18 0540  WBC 5.7  HGB 9.0*  HCT 28.2*  PLT 108*   BMET Recent Labs    06/02/18 0540  NA 135  K 4.6  CL 102  CO2 19*  GLUCOSE 103*  BUN 25*  CREATININE 1.45*  CALCIUM 8.8*   LFT Recent Labs    06/02/18 0540  PROT 6.2*  ALBUMIN 2.6*  AST 49*  ALT 21  ALKPHOS 152*  BILITOT 5.6*   PT/INR Recent Labs    06/02/18 0540  LABPROT 19.2*  INR 1.64   Studies/Results: Ir Paracentesis  Result Date: 05/31/2018 INDICATION: History of ETOH cirrhosis with recurrent ascites, esophageal varices, GI bleed and encephalopathy. Request for diagnostic and therapeutic paracentesis today. EXAM: ULTRASOUND GUIDED PARACENTESIS MEDICATIONS: 1% lidocaine 10 mL COMPLICATIONS: None immediate. PROCEDURE: Informed written consent was obtained from the patient after a discussion of the risks, benefits and alternatives to  treatment. A timeout was performed prior to the initiation of the procedure. Initial ultrasound scanning demonstrates a large amount of ascites within the right lower abdominal quadrant. The right lower abdomen was prepped and draped in the usual sterile fashion. 1% lidocaine with epinephrine was used for local anesthesia. Following this, a 19 gauge, 7-cm, Yueh catheter was introduced. An ultrasound image was saved for documentation purposes. The paracentesis was performed. The catheter was removed and a dressing was applied. The patient tolerated the procedure well without immediate post procedural complication. FINDINGS: A total of approximately 7 L of clear yellow fluid was removed. Samples were sent to the laboratory as requested by the clinical team. IMPRESSION: Successful ultrasound-guided paracentesis yielding 7 liters of peritoneal fluid. Read by: Gareth Eagle, PA-C Electronically Signed   By: Markus Daft M.D.   On: 05/31/2018 14:12   IMPRESSION:  *ETOH cirrhosis complicated by ascites, HE, bleeding esophageal varices, thrombocytopenia, and mild coagulopathy. *Variceal bleed with large esophageal varices with 10 bands total placed last month:  Presented on this occasion with 2 episodes of hematemesis.  Is on PPI gtt and octreotide gtt. *Acute blood loss anemia:  Hgb down about about 3 grams in the past 9 days. *Ascites:  S/p 7 Liter paracentesis on 12/9.  Cytology negative for malignant cells and studies negative for SBP.  Is on lasix 60 mg daily and aldactone 150 mg daily at home currently. *HE:  On lactulose 30 grams TID and xifaxan 550 mg BID at home. *ETOH abuse:  Needs abstinence.  Strongly encouraged to seek professional counseling. *Elevated CA 125 at 485  PLAN: *I have started  IV Rocephin. *Hopefully EGD later this afternoon with Dr. Loletha Carrow. *Monitor Hgb and transfuse prn.   Laban Emperor. Cadynce Garrette  06/02/2018, 8:59 AM

## 2018-06-02 NOTE — Op Note (Signed)
[facility] Patient Name: Katie Holland Procedure Date : 06/02/2018 MRN: [id number] Attending MD: Estill Cotta. Loletha Carrow , MD Date of Birth: 10-16-1965 CSN: [id number] Age: 52 Admit Type: Outpatient Procedure:                Upper GI endoscopy Indications:              Acute post hemorrhagic anemia, Hematemesis,                            Cirrhosis with UGI bleeding suspected esophageal                            varices (patient with recent esophageal variceal                            bleeding) Providers:                Estill Cotta. Loletha Carrow, MD, Burtis Junes, RN, Elspeth Cho                            Tech., Technician, Vania Rea, CRNA Referring MD:             [facility] Medicines:                General Anesthesia (initially MAC, then converted                            to GETA for airway protection when active bleeding                            discovered) Complications:            No immediate complications. Estimated Blood Loss:     Estimated blood loss was minimal. Procedure:                Pre-Anesthesia Assessment:                           - Prior to the procedure, a History and Physical                            was performed, and patient medications and                            allergies were reviewed. The patient's tolerance of                            previous anesthesia was also reviewed. The risks                            and benefits of the procedure and the sedation                            options and risks were discussed with the patient.  All questions were answered, and informed consent                            was obtained. Prior Anticoagulants: The patient has                            taken no previous anticoagulant or antiplatelet                            agents. ASA Grade Assessment: IV - A patient with                            severe systemic disease that is a constant threat   to life. After reviewing the risks and benefits,                            the patient was deemed in satisfactory condition to                            undergo the procedure.                           After obtaining informed consent, the endoscope was                            passed under direct vision. Throughout the                            procedure, the patient's blood pressure, pulse, and                            oxygen saturations were monitored continuously. The                            GIF-H190 ([id number] Olympus Adult EGD was introduced                            through the mouth, and advanced to the second part                            of duodenum. The upper GI endoscopy was                            accomplished without difficulty. The patient                            tolerated the procedure well. Scope In: Scope Out: Findings:      Spurting grade II varices were found in the lower third of the       esophagus, 32 to 38 cm from the incisors. Scarring from prior treatment       was visible. Four bands were successfully placed with complete       eradication, resulting in deflation of varices. One additional band       failed. Bleeding had stopped  at the end of the procedure.      A large amount of fresh and clotted blood was found in the gastric       fundus and in the gastric body, obscuring visualization.      A small amount of fresh blood was found in the entire duodenum. It was       washed away without underyling bleeding source seen. Impression:               - Bleeding grade II esophageal varices. Completely                            eradicated. Banded.                           - Red blood in the gastric fundus and in the                            gastric body.                           - Blood in the entire examined duodenum.                           - No specimens collected. Moderate Sedation:      GETA Recommendation:           - Return patient  to ICU for ongoing care.                            Continuous protonix and octreotide drip x 48 hours,                            serial CBC and transfuse blood products as needed.                           No NGT or OGT                           - Continue present medications.                           - 2 grams ceftriaxone daily x 4 more days. Procedure Code(s):        --- Professional ---                           [phone], Esophagogastroduodenoscopy, flexible,                            transoral; with band ligation of esophageal/gastric                            varices Diagnosis Code(s):        --- Professional ---                           K74.60, Unspecified cirrhosis of liver  I85.11, Secondary esophageal varices with bleeding                           D62, Acute posthemorrhagic anemia                           K92.0, Hematemesis CPT copyright 2018 American Medical Association. All rights reserved. The codes documented in this report are preliminary and upon coder review may  be revised to meet current compliance requirements. Lache Dagher L. Loletha Carrow, MD 06/02/2018 12:58:10 PM This report has been signed electronically. Number of Addenda: 0

## 2018-06-02 NOTE — H&P (Signed)
History and Physical    Carloyn Lahue HER:[id number] DOB: 06-30-1965 DOA: 06/02/2018  PCP: Billie Ruddy, MD Consultants:  Ardis Hughs - GI Patient coming from:  Home - lives alone; [facility]: Daughter, [phone]  Chief Complaint: UGI bleeding  HPI: Katie Holland is a 52 y.o. female with medical history significant of alcoholic liver disease with cirrhosis and esophageal varices presenting with UGI bleeding.  She started vomiting blood overnight.  She had 2 episodes of bright red emesis with clots.  She denies h/o similar.  Last banding was 11/3 and 11/5.  She feels a bit better but is still nauseated.  She last drank a beer yesterday.   ED Course:  H/o esophageal bleeds - cirrhosis, ETOH dependence.  H/o banding in November.  2 paracentesis since including 2 days ago.  2 episodes of bloody emesis, no bleeding here.  Hgb 9, was 11.5 2 weeks ago.   Hemodynamically stable on Octreotide and Protonix.  [facility] will see.  Likely needs SDU.  Review of Systems: As per HPI; otherwise review of systems reviewed and negative.   Ambulatory Status:  Ambulates with a cane  Past Medical History:  Diagnosis Date  . Alcoholic liver disease ([place])   . Chronic back pain    on disability  . Cirrhosis of liver ([place])   . Esophageal varices ([place])   . GERD (gastroesophageal reflux disease)   . Jaundice     Past Surgical History:  Procedure Laterality Date  . BACK SURGERY    . ESOPHAGEAL BANDING  04/25/2018   Procedure: ESOPHAGEAL BANDING;  Surgeon: Yetta Flock, MD;  Location: [facility] ENDOSCOPY;  Service: Gastroenterology;;  . ESOPHAGEAL BANDING  04/27/2018   Procedure: ESOPHAGEAL BANDING;  Surgeon: Milus Banister, MD;  Location: [facility] ENDOSCOPY;  Service: Endoscopy;;  . ESOPHAGOGASTRODUODENOSCOPY N/A 04/27/2018   Procedure: ESOPHAGOGASTRODUODENOSCOPY (EGD);  Surgeon: Milus Banister, MD;  Location: [facility] ENDOSCOPY;  Service: Endoscopy;  Laterality: N/A;  . ESOPHAGOGASTRODUODENOSCOPY (EGD) WITH PROPOFOL N/A  04/25/2018   Procedure: ESOPHAGOGASTRODUODENOSCOPY (EGD) WITH PROPOFOL;  Surgeon: Yetta Flock, MD;  Location: [place];  Service: Gastroenterology;  Laterality: N/A;  . IR PARACENTESIS  04/26/2018  . IR PARACENTESIS  05/18/2018  . IR PARACENTESIS  05/31/2018    Social History   Socioeconomic History  . Marital status: Single    Spouse name: Not on file  . Number of children: Not on file  . Years of education: Not on file  . Highest education level: Not on file  Occupational History  . Occupation: disability  Social Needs  . Financial resource strain: Not on file  . Food insecurity:    Worry: Not on file    Inability: Not on file  . Transportation needs:    Medical: Not on file    Non-medical: Not on file  Tobacco Use  . Smoking status: Current Every Day Smoker    Packs/day: 0.75    Years: 20.00    Pack years: 15.00  . Smokeless tobacco: Never Used  Substance and Sexual Activity  . Alcohol use: Yes    Comment: denies h/o withdrawal  . Drug use: Not Currently  . Sexual activity: Not on file  Lifestyle  . Physical activity:    Days per week: Not on file    Minutes per session: Not on file  . Stress: Not on file  Relationships  . Social connections:    Talks on phone: Not on file    Gets together: Not on file  Attends religious service: Not on file    Active member of club or organization: Not on file    Attends meetings of clubs or organizations: Not on file    Relationship status: Not on file  . Intimate partner violence:    Fear of current or ex partner: Not on file    Emotionally abused: Not on file    Physically abused: Not on file    Forced sexual activity: Not on file  Other Topics Concern  . Not on file  Social History Narrative  . Not on file    Allergies  Allergen Reactions  . Contrast Media [Iodinated Diagnostic Agents] Itching and Swelling  . Latex Itching and Swelling    No breathing impairment, however  . Sulfa Antibiotics Itching  and Swelling    No breathing impairment, however    History reviewed. No pertinent family history.  Prior to Admission medications   Medication Sig Start Date End Date Taking? Authorizing Provider  ferrous sulfate 325 (65 FE) MG EC tablet Take 1 tablet (325 mg total) by mouth 2 (two) times daily. 05/03/18 05/03/19 Yes Elgergawy, Silver Huguenin, MD  folic acid (FOLVITE) 1 MG tablet Take 1 tablet (1 mg total) by mouth daily. 05/03/18  Yes Elgergawy, Silver Huguenin, MD  furosemide (LASIX) 20 MG tablet Take 3 tablets (60 mg total) by mouth daily. 05/28/18 06/27/18 Yes Esterwood, Amy S, PA-C  Lactulose 20 GM/30ML SOLN Take 45 mLs (30 g total) by mouth 3 (three) times daily. Take 30 mls every eight hours. Titrate for 3 bm per 24 hrs 05/27/18  Yes Esterwood, Amy S, PA-C  Multiple Vitamin (MULTIVITAMIN WITH MINERALS) TABS tablet Take 1 tablet by mouth daily. 05/03/18  Yes Elgergawy, Silver Huguenin, MD  pantoprazole (PROTONIX) 40 MG tablet Take 1 tablet (40 mg total) by mouth daily. 10/26/17  Yes Billie Ruddy, MD  potassium chloride (K-DUR) 10 MEQ tablet Take 1 tablet (10 mEq total) by mouth daily. 05/03/18  Yes Elgergawy, Silver Huguenin, MD  prednisoLONE 5 MG TABS tablet Take 8 tablets (40 mg total) by mouth daily. 05/03/18  Yes Elgergawy, Silver Huguenin, MD  rifaximin (XIFAXAN) 550 MG TABS tablet Take 1 tablet (550 mg total) by mouth 2 (two) times daily. 05/27/18  Yes Esterwood, Amy S, PA-C  thiamine 100 MG tablet Take 1 tablet (100 mg total) by mouth daily. 05/03/18  Yes Elgergawy, Silver Huguenin, MD  spironolactone (ALDACTONE) 100 MG tablet Take 1.5 tablets (150 mg total) by mouth daily. Patient not taking: Reported on 06/02/2018 05/28/18 06/27/18  Alfredia Ferguson, PA-C    Physical Exam: Vitals:   06/02/18 1315 06/02/18 1316 06/02/18 1330 06/02/18 1400  BP: 98/64 98/64 (!) 88/61 103/65  Pulse:   (!) 110 81  Resp:  (!) 41 (!) 32 20  Temp:  98.1 F (36.7 C)  98.5 F (36.9 C)  TempSrc:    Oral  SpO2:   100% 100%  Weight:        Height:         General:  Appears calm and comfortable and is NAD Eyes:  PERRL, EOMI, normal lids, iris ENT:  grossly normal hearing, lips & tongue, mmm; poor dentition Neck:  no LAD, masses or thyromegaly Cardiovascular:  RR with mild tachycardia, no m/r/g. No LE edema.  Respiratory:   CTA bilaterally with no wheezes/rales/rhonchi.  Normal respiratory effort. Abdomen:  soft, NT, ND, NABS Back:   normal alignment, no CVAT Skin:  no rash or induration seen on  limited exam Musculoskeletal:  grossly normal tone BUE/BLE, good ROM, no bony abnormality Psychiatric:  grossly normal mood and affect, speech fluent and appropriate, AOx3 Neurologic:  CN 2-12 grossly intact, moves all extremities in coordinated fashion, sensation intact    Radiological Exams on Admission: No results found.  EKG: Independently reviewed.  Sinus tachycardia with rate 107; nonspecific ST changes with no evidence of acute ischemia   Labs on Admission: I have personally reviewed the available labs and imaging studies at the time of the admission.  Pertinent labs:   CO2 19 Glucose 103 BUN 25/Creatinine 1.45/GFR 41 AP 152 AST 49/ALT 21/Bili 5.6 WBC 5.7 Hgb 9.0; 11.5 on 12/5; 12.2 on 12/2 Plt 108 INR 1.64 CA 125 on 12/5  Assessment/Plan Principal Problem:   Acute upper GI bleeding Active Problems:   Alcoholic cirrhosis of liver with ascites (HCC)   Tobacco dependence   Bleeding esophageal varices (HCC)   Alcohol dependence syndrome (HCC)   Acute upper GI bleeding from varices -Patient with known alcoholic liver disease with h/o variceal bleeding presenting with recurrent UGI bleeding, likely variceal  - will admit to SDU bed - GI consulted by ED, will follow up recommendations - NPO for possible EGD - Patient started on Octreotide and Protonix drips - Zofran IV for nausea - Avoid NSAIDs and SQ heparin - Maintain IV access (2 large bore IVs if possible). - Monitor closely and follow cbc,  transfuse as necessary. -Initial plan was to admit to SDU; however, patient was not as stable during/after the procedure -GI called to report that there was active bleeding from varices and she is S/p banding -During the procedure, she became hypotensive, started on Neosynephrine -GI Will call PCCM since she will need ICU admission  Alcoholic cirrhosis with ascites -MELD/MELD-Na score is 22/24, with a mortality rate of 19.6% -She is not a candidate for liver transplant since she is still drinking -She has been started on prednisolone based on possibly improved survival according to discriminant function -She undergoes routine paracentesis -She CA-125 is quite elevated and is concerning for hepatocellular carcinoma; however, cytology was benign on 12/9 paracentesis -She may need TIPS in the future  ETOH dependence -Patient with chronic ETOH dependence -She is at high risk for complications of withdrawal including seizures, DTs -SDU CIWA protocol ordered  Tobacco dependence -Tobacco Dependence: encourage cessation.   -This was discussed with the patient and should be reviewed on an ongoing basis.   -Patch ordered at patient request.  DVT prophylaxis:  SCDs Code Status:  Full - confirmed with patient Family Communication: None present Disposition Plan:  Home once clinically improved Consults called: GI (then PCCM)  Admission status: Admit - It is my clinical opinion that admission to [place] is reasonable and necessary because of the expectation that this patient will require hospital care that crosses at least 2 midnights to treat this condition based on the medical complexity of the problems presented.  Given the aforementioned information, the predictability of an adverse outcome is felt to be significant.    Karmen Bongo MD [facility]  If note is complete, please contact covering daytime or nighttime physician. [email or web address] Password [email or web address]  06/02/2018, 3:13 PM

## 2018-06-02 NOTE — ED Notes (Signed)
Pt transported to endoscopy for EGD. 

## 2018-06-02 NOTE — ED Triage Notes (Signed)
Patient states she was recently admitted to hospital for upper GI bleed  States today she was having some abd. Pain was able to go to sleep however the pain woke her up and states she vomited x 2 first she had dark clots then it was bright red,. Abd. Is di stented.

## 2018-06-02 NOTE — Anesthesia Preprocedure Evaluation (Addendum)
Anesthesia Evaluation  Patient identified by MRN, date of birth, ID band Patient awake    Reviewed: Allergy & Precautions, NPO status , Patient's Chart, lab work & pertinent test results  History of Anesthesia Complications Negative for: history of anesthetic complications  Airway Mallampati: I  TM Distance: >3 FB Neck ROM: Full    Dental no notable dental hx.    Pulmonary Current Smoker,    Pulmonary exam normal        Cardiovascular negative cardio ROS Normal cardiovascular exam     Neuro/Psych negative neurological ROS  negative psych ROS   GI/Hepatic GERD  ,(+) Cirrhosis   Esophageal Varices and ascites  substance abuse  alcohol use,   Endo/Other  negative endocrine ROS  Renal/GU negative Renal ROS  negative genitourinary   Musculoskeletal negative musculoskeletal ROS (+)   Abdominal   Peds  Hematology  (+) anemia ,   Anesthesia Other Findings EGD for bleeding esophageal varices. No vomiting since last night.  Reproductive/Obstetrics                            Anesthesia Physical Anesthesia Plan  ASA: III  Anesthesia Plan: MAC   Post-op Pain Management:    Induction:   PONV Risk Score and Plan: 1 and Propofol infusion and Treatment may vary due to age or medical condition  Airway Management Planned: Nasal Cannula and Simple Face Mask  Additional Equipment: None  Intra-op Plan:   Post-operative Plan: Extubation in OR  Informed Consent: I have reviewed the patients History and Physical, chart, labs and discussed the procedure including the risks, benefits and alternatives for the proposed anesthesia with the patient or authorized representative who has indicated his/her understanding and acceptance.     Plan Discussed with:   Anesthesia Plan Comments:        Anesthesia Quick Evaluation

## 2018-06-02 NOTE — Interval H&P Note (Signed)
History and Physical Interval Note:  06/02/2018 11:57 AM  Katie Holland  has presented today for surgery, with the diagnosis of Vomiting blood; history of esophageal varices  The various methods of treatment have been discussed with the patient and family. After consideration of risks, benefits and other options for treatment, the patient has consented to  Procedure(s): ESOPHAGOGASTRODUODENOSCOPY (EGD) WITH PROPOFOL (N/A) as a surgical intervention .  The patient's history has been reviewed, patient examined, no change in status, stable for surgery.  I have reviewed the patient's chart and labs.  Questions were answered to the patient's satisfaction.    I personally interviewed and evaluated the patient in the pre-procedure area, discussed case with my team, and reviewed data.  Probable recurrent variceal bleed.  Nelida Meuse III

## 2018-06-02 NOTE — Progress Notes (Signed)
Called into patient's room by NT. Patient had vomited up a large amount of dark red blood clots. Zofran given as well as Ativan per CIWA score. GI notified as well as CCM. STAT CBC per GI and 568ml LR bolus per CCM.

## 2018-06-03 DIAGNOSIS — D62 Acute posthemorrhagic anemia: Secondary | ICD-10-CM

## 2018-06-03 DIAGNOSIS — F1020 Alcohol dependence, uncomplicated: Secondary | ICD-10-CM

## 2018-06-03 DIAGNOSIS — D689 Coagulation defect, unspecified: Secondary | ICD-10-CM

## 2018-06-03 DIAGNOSIS — D696 Thrombocytopenia, unspecified: Secondary | ICD-10-CM

## 2018-06-03 DIAGNOSIS — K7290 Hepatic failure, unspecified without coma: Secondary | ICD-10-CM

## 2018-06-03 DIAGNOSIS — K7031 Alcoholic cirrhosis of liver with ascites: Principal | ICD-10-CM

## 2018-06-03 LAB — BASIC METABOLIC PANEL
ANION GAP: 12 (ref 5–15)
BUN: 26 mg/dL — ABNORMAL HIGH (ref 6–20)
CO2: 17 mmol/L — ABNORMAL LOW (ref 22–32)
Calcium: 8.4 mg/dL — ABNORMAL LOW (ref 8.9–10.3)
Chloride: 110 mmol/L (ref 98–111)
Creatinine, Ser: 1.41 mg/dL — ABNORMAL HIGH (ref 0.44–1.00)
GFR calc Af Amer: 49 mL/min — ABNORMAL LOW (ref >60)
GFR calc non Af Amer: 42 mL/min — ABNORMAL LOW (ref >60)
Glucose, Bld: 108 mg/dL — ABNORMAL HIGH (ref 70–99)
Potassium: 4.8 mmol/L (ref 3.5–5.1)
Sodium: 139 mmol/L (ref 135–145)

## 2018-06-03 LAB — CBC
HCT: 21.9 % — ABNORMAL LOW (ref 36.0–46.0)
Hemoglobin: 7.1 g/dL — ABNORMAL LOW (ref 12.0–15.0)
MCH: 28.7 pg (ref 26.0–34.0)
MCHC: 32.4 g/dL (ref 30.0–36.0)
MCV: 88.7 fL (ref 80.0–100.0)
Platelets: 76 10*3/uL — ABNORMAL LOW (ref 150–400)
RBC: 2.47 MIL/uL — AB (ref 3.87–5.11)
RDW: 20.1 % — ABNORMAL HIGH (ref 11.5–15.5)
WBC: 4.2 10*3/uL (ref 4.0–10.5)
nRBC: 0 % (ref 0.0–0.2)

## 2018-06-03 LAB — HEMOGLOBIN AND HEMATOCRIT, BLOOD
HCT: 27.3 % — ABNORMAL LOW (ref 36.0–46.0)
Hemoglobin: 8.8 g/dL — ABNORMAL LOW (ref 12.0–15.0)

## 2018-06-03 LAB — GLUCOSE, CAPILLARY
GLUCOSE-CAPILLARY: 107 mg/dL — AB (ref 70–99)
Glucose-Capillary: 94 mg/dL (ref 70–99)

## 2018-06-03 LAB — PREPARE RBC (CROSSMATCH)

## 2018-06-03 MED ORDER — SODIUM CHLORIDE 0.9% IV SOLUTION: Freq: Once | INTRAVENOUS | Status: DC

## 2018-06-03 MED ORDER — LACTULOSE 10 GM/15ML PO SOLN
20.0000 g | Freq: Three times a day (TID) | ORAL | Status: DC
  Administered 2018-06-03 – 2018-06-06 (×10): 20 g via ORAL
  Filled 2018-06-03 (×10): qty 30

## 2018-06-03 MED ORDER — IBUPROFEN 100 MG PO CHEW
400.0000 mg | CHEWABLE_TABLET | Freq: Three times a day (TID) | ORAL | Status: DC | PRN
  Filled 2018-06-03 (×3): qty 4

## 2018-06-03 MED ORDER — SODIUM CHLORIDE 0.9% IV SOLUTION
Freq: Once | INTRAVENOUS | Status: AC
  Administered 2018-06-03: 12:00:00 via INTRAVENOUS

## 2018-06-03 MED ORDER — SODIUM CHLORIDE 0.9% IV SOLUTION
Freq: Once | INTRAVENOUS | Status: AC
  Administered 2018-06-03: 17:00:00 via INTRAVENOUS

## 2018-06-03 MED ORDER — THIAMINE HCL 100 MG/ML IJ SOLN
100.0000 mg | Freq: Every day | INTRAMUSCULAR | Status: DC
  Administered 2018-06-04 – 2018-06-05 (×2): 100 mg via INTRAVENOUS
  Filled 2018-06-03 (×2): qty 2

## 2018-06-03 MED ORDER — FOLIC ACID 5 MG/ML IJ SOLN
1.0000 mg | Freq: Every day | INTRAMUSCULAR | Status: DC
  Administered 2018-06-04 – 2018-06-05 (×2): 1 mg via INTRAVENOUS
  Filled 2018-06-03 (×2): qty 0.2

## 2018-06-03 NOTE — Progress Notes (Addendum)
[facility] Progress Note Patient Name: Katie Holland DOB: 30-Nov-1965 MRN: [id number]   Date of Service  06/03/2018  HPI/Events of Note  PT with GI bleed.  Pt was given lactulose enema this morning and had dark bloody bowel movement.  Hgb 7.1 <--8.1  eICU Interventions  Transfuse 1 unit pRBC. D/c enema     Intervention Category Major Interventions: Hemorrhage - evaluation and management  Elsie Lincoln 06/03/2018, 5:59 AM   6:45 AM  Per RN has not had any urine output in 12 hours.  Bladder scan reveals >800cc. Insert foley cath.

## 2018-06-03 NOTE — Consult Note (Signed)
Chief Complaint: Patient was seen in consultation today for TIPS.  Supervising Physician: Markus Daft  Patient Status: [facility] - In-pt  History of Present Illness: Katie Holland is a 52 y.o. female with a past medical history significant for ETOH abuse, ETOH cirrhosis with recurrent ascites with most recent paracentesis 05/31/18 in IR and esophageal varices with recent banding 04/25/18 and 04/27/18 by GI who presented to [facility] ED on 06/02/18 due to hematemesis and abdominal pain. She was admitted for further evaluation and management. She was evaluated by GI who proceeded with EGD later that day which showed grade II bleeding esophageal varices which were banded x 4, large amount of fresh and clotted blood in the gastric fundus and gastric body as well as small amount of fresh blood in the entire duodenum. She was placed in ICU for ongoing management with continuous protonix and octreotide drip. Since EGD she had one episode of dark red emesis, serial CBCs were obtained which showed a decrease in hgb from 11.5 (12/5) to 7.1 (12/12) and she is currently receiving fluid bolus as well as PRBCs/FFP. Request has been made to IR for evaluation of patient for possible TIPS.  Patient somnolent on arrival, does arouse easily to verbal cues, able to participate somewhat in discussion with Dr. Anselm Pancoast regarding TIPS procedure. She states that she believes this procedure has been discussed with her previously at another facility but she is unsure. She does have paperwork in a folder however it's unclear after brief review if TIPS has been discussed before.   Past Medical History:  Diagnosis Date  . Alcoholic liver disease ([place])   . Chronic back pain    on disability  . Cirrhosis of liver ([place])   . Esophageal varices ([place])   . GERD (gastroesophageal reflux disease)   . Jaundice     Past Surgical History:  Procedure Laterality Date  . BACK SURGERY    . ESOPHAGEAL BANDING  04/25/2018   Procedure: ESOPHAGEAL  BANDING;  Surgeon: Yetta Flock, MD;  Location: [facility] ENDOSCOPY;  Service: Gastroenterology;;  . ESOPHAGEAL BANDING  04/27/2018   Procedure: ESOPHAGEAL BANDING;  Surgeon: Milus Banister, MD;  Location: [facility] ENDOSCOPY;  Service: Endoscopy;;  . ESOPHAGOGASTRODUODENOSCOPY N/A 04/27/2018   Procedure: ESOPHAGOGASTRODUODENOSCOPY (EGD);  Surgeon: Milus Banister, MD;  Location: [facility] ENDOSCOPY;  Service: Endoscopy;  Laterality: N/A;  . ESOPHAGOGASTRODUODENOSCOPY (EGD) WITH PROPOFOL N/A 04/25/2018   Procedure: ESOPHAGOGASTRODUODENOSCOPY (EGD) WITH PROPOFOL;  Surgeon: Yetta Flock, MD;  Location: [place];  Service: Gastroenterology;  Laterality: N/A;  . IR PARACENTESIS  04/26/2018  . IR PARACENTESIS  05/18/2018  . IR PARACENTESIS  05/31/2018    Allergies: Contrast media [iodinated diagnostic agents]; Latex; and Sulfa antibiotics  Medications: Prior to Admission medications   Medication Sig Start Date End Date Taking? Authorizing Provider  ferrous sulfate 325 (65 FE) MG EC tablet Take 1 tablet (325 mg total) by mouth 2 (two) times daily. 05/03/18 05/03/19 Yes Elgergawy, Silver Huguenin, MD  folic acid (FOLVITE) 1 MG tablet Take 1 tablet (1 mg total) by mouth daily. 05/03/18  Yes Elgergawy, Silver Huguenin, MD  furosemide (LASIX) 20 MG tablet Take 3 tablets (60 mg total) by mouth daily. 05/28/18 06/27/18 Yes Esterwood, Amy S, PA-C  Lactulose 20 GM/30ML SOLN Take 45 mLs (30 g total) by mouth 3 (three) times daily. Take 30 mls every eight hours. Titrate for 3 bm per 24 hrs 05/27/18  Yes Esterwood, Amy S, PA-C  Multiple Vitamin (MULTIVITAMIN WITH MINERALS) TABS tablet  Take 1 tablet by mouth daily. 05/03/18  Yes Elgergawy, Silver Huguenin, MD  pantoprazole (PROTONIX) 40 MG tablet Take 1 tablet (40 mg total) by mouth daily. 10/26/17  Yes Billie Ruddy, MD  potassium chloride (K-DUR) 10 MEQ tablet Take 1 tablet (10 mEq total) by mouth daily. 05/03/18  Yes Elgergawy, Silver Huguenin, MD  prednisoLONE 5 MG TABS tablet Take 8  tablets (40 mg total) by mouth daily. 05/03/18  Yes Elgergawy, Silver Huguenin, MD  rifaximin (XIFAXAN) 550 MG TABS tablet Take 1 tablet (550 mg total) by mouth 2 (two) times daily. 05/27/18  Yes Esterwood, Amy S, PA-C  thiamine 100 MG tablet Take 1 tablet (100 mg total) by mouth daily. 05/03/18  Yes Elgergawy, Silver Huguenin, MD  spironolactone (ALDACTONE) 100 MG tablet Take 1.5 tablets (150 mg total) by mouth daily. Patient not taking: Reported on 06/02/2018 05/28/18 06/27/18  Alfredia Ferguson, PA-C     History reviewed. No pertinent family history.  Social History   Socioeconomic History  . Marital status: Single    Spouse name: Not on file  . Number of children: Not on file  . Years of education: Not on file  . Highest education level: Not on file  Occupational History  . Occupation: disability  Social Needs  . Financial resource strain: Not on file  . Food insecurity:    Worry: Not on file    Inability: Not on file  . Transportation needs:    Medical: Not on file    Non-medical: Not on file  Tobacco Use  . Smoking status: Current Every Day Smoker    Packs/day: 0.75    Years: 20.00    Pack years: 15.00  . Smokeless tobacco: Never Used  Substance and Sexual Activity  . Alcohol use: Yes    Comment: denies h/o withdrawal  . Drug use: Not Currently  . Sexual activity: Not on file  Lifestyle  . Physical activity:    Days per week: Not on file    Minutes per session: Not on file  . Stress: Not on file  Relationships  . Social connections:    Talks on phone: Not on file    Gets together: Not on file    Attends religious service: Not on file    Active member of club or organization: Not on file    Attends meetings of clubs or organizations: Not on file    Relationship status: Not on file  Other Topics Concern  . Not on file  Social History Narrative  . Not on file     Review of Systems: A 12 point ROS discussed and pertinent positives are indicated in the HPI above.  All other  systems are negative.  Review of Systems  Constitutional: Positive for fatigue.       Limited ROS due to somnolence  Respiratory: Negative for shortness of breath.   Cardiovascular: Negative for chest pain.  Gastrointestinal: Positive for abdominal pain (improved; still some in LLQ) and nausea. Negative for vomiting (not since last night).    Vital Signs: BP 96/66   Pulse 92   Temp (!) 97.4 F (36.3 C) (Oral)   Resp 17   Ht 5\' 4"  (1.626 m)   Wt 138 lb 14.2 oz (63 kg)   SpO2 99%   BMI 23.84 kg/m   Physical Exam Vitals signs and nursing note reviewed.  Constitutional:      General: She is not in acute distress.    Appearance: She is  not diaphoretic.     Comments: Thin, ill appearing, no family at bedside  HENT:     Head: Normocephalic.      Comments: Left EJ presentCardiovascular:     Rate and Rhythm: Normal rate.     Pulses: Intact distal pulses.  Pulmonary:     Effort: Pulmonary effort is normal.  Abdominal:     General: There is distension.     Tenderness: There is abdominal tenderness.  Skin:    General: Skin is warm and dry.  Neurological:     Comments: somnolent      Imaging: US Liver Doppler  Result Date: 06/03/2018 CLINICAL DATA:  Cirrhosis, portal hypertension, acute variceal bleeding from esophageal varices, ascites and possible need for TIPS procedure. Assessment of liver vasculature and portal vein patency prior to possible TIPS. EXAM: DUPLEX ULTRASOUND OF LIVER TECHNIQUE: Color and duplex Doppler ultrasound was performed to evaluate the hepatic in-flow and out-flow vessels. COMPARISON:  CT of the abdomen without contrast on 04/25/2018 FINDINGS: Portal Vein Velocities The portal vein is completely occluded and contains thrombus. There are some collateral vessels in the porta hepatis that appear to reconstitute some intrahepatic portal vein radicles in both right and left lobes. Findings are consistent with cavernous transformation. Hepatic Vein Velocities  Right:  12 cm/sec Middle:  19 cm/sec Left:  30 cm/sec Hepatic Artery Velocity: 196 cm/sec. Hepatic arterial waveform is normal. Elevated velocities are consistent with demonstrated portal vein thrombosis and compensatory increased hepatic arterial flow. Splenic Vein Velocity:  26 cm/sec Varices: No abdominal varices or perisplenic varices visualized by ultrasound. Ascites: Large volume ascites identified in the peritoneal cavity, especially around the liver. The liver to self demonstrates evidence of advanced cirrhosis and is small, fibrotic and shrunken in appearance with grossly nodular surface contour. No gross hepatic masses identified by ultrasound. The spleen is not enlarged. IMPRESSION: 1. Complete thrombosis of the main portal vein with collateral vessels in the porta hepatis that appear to reconstitute intrahepatic portal vein radicles. Findings are consistent with cavernous transformation. Due to what appears to be chronic thrombosis of the portal vein, the patient is likely not a candidate for TIPS. CT of the abdomen and pelvis with contrast may be of additional benefit in reconfirming portal vein thrombosis and assessing for other venous collateral pathways. 2. Compensatory increased hepatic arterial flow. 3. The hepatic veins are patent.  Large volume of ascites present. 4. The liver shows evidence of advanced cirrhosis. Electronically Signed   By: Aletta Edouard M.D.   On: 06/03/2018 08:17   Ir Paracentesis  Result Date: 05/31/2018 INDICATION: History of ETOH cirrhosis with recurrent ascites, esophageal varices, GI bleed and encephalopathy. Request for diagnostic and therapeutic paracentesis today. EXAM: ULTRASOUND GUIDED PARACENTESIS MEDICATIONS: 1% lidocaine 10 mL COMPLICATIONS: None immediate. PROCEDURE: Informed written consent was obtained from the patient after a discussion of the risks, benefits and alternatives to treatment. A timeout was performed prior to the initiation of the procedure.  Initial ultrasound scanning demonstrates a large amount of ascites within the right lower abdominal quadrant. The right lower abdomen was prepped and draped in the usual sterile fashion. 1% lidocaine with epinephrine was used for local anesthesia. Following this, a 19 gauge, 7-cm, Yueh catheter was introduced. An ultrasound image was saved for documentation purposes. The paracentesis was performed. The catheter was removed and a dressing was applied. The patient tolerated the procedure well without immediate post procedural complication. FINDINGS: A total of approximately 7 L of clear yellow fluid was removed.  Samples were sent to the laboratory as requested by the clinical team. IMPRESSION: Successful ultrasound-guided paracentesis yielding 7 liters of peritoneal fluid. Read by: Gareth Eagle, PA-C Electronically Signed   By: Markus Daft M.D.   On: 05/31/2018 14:12   Ir Paracentesis  Result Date: 05/18/2018 INDICATION: Ascites secondary to alcoholic liver disease. Request for therapeutic paracentesis. EXAM: ULTRASOUND GUIDED PARACENTESIS MEDICATIONS: 1% lidocaine 10 mL COMPLICATIONS: None immediate. PROCEDURE: Informed written consent was obtained from the patient after a discussion of the risks, benefits and alternatives to treatment. A timeout was performed prior to the initiation of the procedure. Initial ultrasound scanning demonstrates a large amount of ascites within the right lower abdominal quadrant. The right lower abdomen was prepped and draped in the usual sterile fashion. 1% lidocaine with epinephrine was used for local anesthesia. Following this, a 19 gauge, 7-cm, Yueh catheter was introduced. An ultrasound image was saved for documentation purposes. The paracentesis was performed. The catheter was removed and a dressing was applied. The patient tolerated the procedure well without immediate post procedural complication. FINDINGS: A total of approximately 6.5 L of clear yellow fluid was removed.  IMPRESSION: Successful ultrasound-guided paracentesis yielding 6.5 liters of peritoneal fluid. Read by: Gareth Eagle, PA-C Electronically Signed   By: Markus Daft M.D.   On: 05/18/2018 15:42    Labs:  CBC: Recent Labs    05/27/18 1514 06/02/18 0540 06/02/18 1825 06/03/18 0202 06/03/18 1032  WBC 4.8 5.7 6.1 4.2  --   HGB 11.5* 9.0* 8.1* 7.1* 8.8*  HCT 35.3* 28.2* 25.4* 21.9* 27.3*  PLT 148.0* 108* 82* 76*  --     COAGS: Recent Labs    04/27/18 0316 04/30/18 0250 05/27/18 1514 06/02/18 0540  INR 2.11 2.10 1.6* 1.64    BMP: Recent Labs    05/02/18 0440 05/17/18 1453 05/24/18 1713 05/27/18 1514 06/02/18 0540 06/03/18 0202  NA 133* 134* 134* 134* 135 139  K 3.1* 3.0* 4.5 4.4 4.6 4.8  CL 103 102 100 101 102 110  CO2 23 21* 22 23 19* 17*  GLUCOSE 166* 123* 102* 135* 103* 108*  BUN 7 13 22  25* 25* 26*  CALCIUM 8.5* 8.8* 9.3 9.0 8.8* 8.4*  CREATININE 0.80 1.16* 1.58* 1.92* 1.45* 1.41*  GFRNONAA >60 53*  --   --  41* 42*  GFRAA >60 >60  --   --  48* 49*    LIVER FUNCTION TESTS: Recent Labs    05/17/18 1453 05/24/18 1713 05/27/18 1514 06/02/18 0540  BILITOT 9.6* 7.9* 7.0* 5.6*  AST 60* 52* 50* 49*  ALT 33 27 28 21   ALKPHOS 160* 199* 212* 152*  PROT 7.1 7.1 6.9 6.2*  ALBUMIN 2.4* 2.9* 2.8* 2.6*    TUMOR MARKERS: Recent Labs    05/27/18 1514  AFPTM 2.7    Assessment and Plan:  Patient with history of ETOH cirrhosis, continued ETOH use and recurrent bleeding esophageal varices s/p banding 04/25/18, 04/27/18 and 06/02/18 admitted for upper GI bleed. Request for TIPS consultation today.  Hgb 8.8, INR 1.64, ammonia 97, creatinine 1.45, t.bili 5.6, AST 49, ALT 21, ALP 152,  MELD-Na 24, RUQ [place] 12/12 shows complete thrombosis of the main portal vein.   Patient has been reviewed by Dr. Anselm Pancoast and is not currently a candidate for TIPS procedure given MELD-Na score of 24, complete portal vein thrombosis and t.bili 5.6. If liver function improves in the future could  consider CT/MRI with contrast to further evaluate portal vein thrombosis and re-consult IR  for possible TIPS procedure.  Please call IR with questions or concerns.  Thank you for this interesting consult.  I greatly enjoyed meeting Aron Needles and look forward to participating in their care.  A copy of this report was sent to the requesting provider on this date.  Electronically Signed: Joaquim Nam, PA-C 06/03/2018, 11:24 AM   I spent a total of 40 Minutes in face to face in clinical consultation, greater than 50% of which was counseling/coordinating care for TIPS consult.

## 2018-06-03 NOTE — Progress Notes (Addendum)
[facility] Progress Note   Chief Complaint:   Cirrhosis, GI bleed    SUBJECTIVE:    she is a little sleepy but smiling. No GI bleeding since episode last night. No complaints.    ASSESSMENT AND PLAN:   Decompensated ETOH cirrhosis (ESLD) requiring frequent paracentesis. Currently admitted with GI bleed. She is s/p EGD yesterday with findings of bleeding grade II varices, s/p banding. Vomited dark red blood last night.  -INR 1.64. In light of bleeding will give FFP as well as platelets since they are steadily declining.  -continue empiric antibiotics for SBP (needs 3 more days) -continue octreotide and PPI gtt through Friday.  -We discussed TIPs procedure with IR. Based on u/s doppler, her portal vein is clotted so probably not candidate but IR will evaluate. -She needs lactulose, the enemas were stopped. Will start PO lactulose.   2. ABL anemia. hgb 7.1 today, down from 8.1 last night and 9.0 on admission. Received another unit of blood this am and follow up H+H pending.     OBJECTIVE:     Vital signs in last 24 hours: Temp:  [97.4 F (36.3 C)-98.7 F (37.1 C)] 97.4 F (36.3 C) (12/12 0900) Pulse Rate:  [81-123] 92 (12/12 0900) Resp:  [0-41] 19 (12/12 0900) BP: (73-115)/(45-77) 96/69 (12/12 0900) SpO2:  [94 %-100 %] 100 % (12/12 0900) Arterial Line BP: (104-107)/(57-59) 104/57 (12/11 1410) Weight:  [48 kg] 63 kg (12/11 1125) Last BM Date: 06/02/18 General:   sleepy female in NAD EENT:  Normal hearing, non icteric sclera, conjunctive pink.  Heart:  Regular rate and rhythm; no murmur.  No lower extremity edema   Pulm: Normal respiratory effort, lungs CTA bilaterally without wheezes or crackles. Abdomen:  Soft, nondistended, nontender.  Normal bowel sounds, no masses felt.       Neurologic:  Alert and  oriented x4;  Asterixis on exam . Psych:  Pleasant, cooperative.  Normal mood and affect.   Intake/Output from previous day: 12/11 0701 - 12/12  0700 In: 4058.2 [I.V.:2979.1; Blood:315; IV Piggyback:764.1] Out: 376 [Urine:375; Stool:1] Intake/Output this shift: Total I/O In: 315 [Blood:315] Out: -   Lab Results: Recent Labs    06/02/18 0540 06/02/18 1825 06/03/18 0202  WBC 5.7 6.1 4.2  HGB 9.0* 8.1* 7.1*  HCT 28.2* 25.4* 21.9*  PLT 108* 82* 76*   BMET Recent Labs    06/02/18 0540 06/03/18 0202  NA 135 139  K 4.6 4.8  CL 102 110  CO2 19* 17*  GLUCOSE 103* 108*  BUN 25* 26*  CREATININE 1.45* 1.41*  CALCIUM 8.8* 8.4*   LFT Recent Labs    06/02/18 0540  PROT 6.2*  ALBUMIN 2.6*  AST 49*  ALT 21  ALKPHOS 152*  BILITOT 5.6*   PT/INR Recent Labs    06/02/18 0540  LABPROT 19.2*  INR 1.64   Hepatitis Panel No results for input(s): HEPBSAG, HCVAB, HEPAIGM, HEPBIGM in the last 72 hours.  US Liver Doppler  Result Date: 06/03/2018 CLINICAL DATA:  Cirrhosis, portal hypertension, acute variceal bleeding from esophageal varices, ascites and possible need for TIPS procedure. Assessment of liver vasculature and portal vein patency prior to possible TIPS. EXAM: DUPLEX ULTRASOUND OF LIVER TECHNIQUE: Color and duplex Doppler ultrasound was performed to evaluate the hepatic in-flow and out-flow vessels. COMPARISON:  CT of the abdomen without contrast on 04/25/2018 FINDINGS: Portal Vein Velocities The portal vein is completely occluded and contains thrombus. There are some collateral vessels in the porta  hepatis that appear to reconstitute some intrahepatic portal vein radicles in both right and left lobes. Findings are consistent with cavernous transformation. Hepatic Vein Velocities Right:  12 cm/sec Middle:  19 cm/sec Left:  30 cm/sec Hepatic Artery Velocity: 196 cm/sec. Hepatic arterial waveform is normal. Elevated velocities are consistent with demonstrated portal vein thrombosis and compensatory increased hepatic arterial flow. Splenic Vein Velocity:  26 cm/sec Varices: No abdominal varices or perisplenic varices  visualized by ultrasound. Ascites: Large volume ascites identified in the peritoneal cavity, especially around the liver. The liver to self demonstrates evidence of advanced cirrhosis and is small, fibrotic and shrunken in appearance with grossly nodular surface contour. No gross hepatic masses identified by ultrasound. The spleen is not enlarged. IMPRESSION: 1. Complete thrombosis of the main portal vein with collateral vessels in the porta hepatis that appear to reconstitute intrahepatic portal vein radicles. Findings are consistent with cavernous transformation. Due to what appears to be chronic thrombosis of the portal vein, the patient is likely not a candidate for TIPS. CT of the abdomen and pelvis with contrast may be of additional benefit in reconfirming portal vein thrombosis and assessing for other venous collateral pathways. 2. Compensatory increased hepatic arterial flow. 3. The hepatic veins are patent.  Large volume of ascites present. 4. The liver shows evidence of advanced cirrhosis. Electronically Signed   By: Aletta Edouard M.D.   On: 06/03/2018 08:17     Principal Problem:   Acute upper GI bleeding Active Problems:   Alcoholic cirrhosis of liver with ascites (H[facility])   Tobacco dependence   Bleeding esophageal varices ([facility])   Alcohol dependence syndrome ([place])     LOS: 1 day   Tye Savoy ,NP 06/03/2018, 9:55 AM  I have discussed the case with the PA, and that is the plan I formulated. I personally interviewed and examined the patient.  Esophageal variceal bleed Acute GI blood loss anemia Underlying alcoholic cirrhosis with portal hypertension ascites and hepatic encephalopathy  Overnight events noted, I also discussed case with Dr. Havery Moros was on-call last evening and heard about the patient's bleeding episode.  He had vomited blood, but it was not clear if it was retained blood in the stomach or ongoing GI bleeding.  Given that she has been persistently hypotensive  requiring pressors and hemoglobin has dropped, seems she may have ongoing GI bleeding.  Interventional radiology was therefore consulted to evaluate for possible TIPS placement.  Ultrasound with Doppler flow shows thrombosed portal vein.  Unfortunately, that means that she is not a candidate for TIPS placement.  She is also not a candidate for liver transplantation given recent alcohol use.  I am hopeful that perhaps with some time, transfusion of blood products and octreotide, the bleeding may yet stop.  Even if it does, her prognosis remains poor since she is at high risk of recurrent upper GI bleeding and endoscopic management patients are limited.  There is no need for repeat upper endoscopy at this point, as the varices were banded as much as possible.  Patient is received PRBCs, but we have also ordered FFP and platelets. Oral lactulose was added as this patient is at high risk for decompensation of mental status from encephalopathy she has encephalopathy at baseline and now has GI bleeding.  Continue 2 g ceftriaxone daily in the setting of upper GI bleed with ascites.  If she continues to bleed, consideration will need to be given to comfort care.  We will follow.  Total time 40 minutes, over  half spent questions with her care team, counseling and coordinating care.  I also reviewed her imaging and case with Dr. Anselm Pancoast of interventional radiology.  Nelida Meuse III Office: [phone]

## 2018-06-03 NOTE — Progress Notes (Signed)
Patient given scheduled lactulose enema. As expected- has put out at least 300 of bloody discharge from rectum. Clots present. Will notify GI night doctor to make aware. Will continue to monitor- but another 600 of output expected from enema instillation.   Milford Cage, RN

## 2018-06-03 NOTE — Progress Notes (Signed)
NAME:  Katie Holland, MRN:  [id number], DOB:  Jun 14, 1966, LOS: 1 ADMISSION DATE:  06/02/2018, CONSULTATION DATE:  06/02/18 REFERRING MD:  Madelon Lips MD, CHIEF COMPLAINT:  GI bleed  Brief History   52 year old with alcohol cirrhosis, esophageal varices presenting with upper GI bleed Underwent EGD, banding.  Became hypotensive during procedure requiring Neo-Synephrine.  PCCM requested to admit to ICU for monitoring.  History of present illness   Complains of vomiting blood overnight.  Reports to episodes of emesis with bright red blood.  Trace history of similar episodes in the past with banding done 11 3 and 11 5.  Past Medical History  Alcohol cirrhosis, esophageal varices, GERD, chronic back pain Considering possible peritoneal carcinomatosis.  However ascites cytology is benign.  Significant Hospital Events   12/11-admit, EGD with banding of varices.  Consults:  GI 12/11-  Procedures:  12/11-EGD  Significant Diagnostic Tests:  Liver ultrasound 06/03/2018 which shows thrombus of the portal vein and occlusion.  Micro Data:    Antimicrobials:  Ceftriaxone 12/11>>   Interim history/subjective:    Objective   Blood pressure 96/66, pulse 92, temperature (!) 97.4 F (36.3 C), temperature source Oral, resp. rate 17, height 5\' 4"  (1.626 m), weight 63 kg, SpO2 99 %.        Intake/Output Summary (Last 24 hours) at 06/03/2018 1115 Last data filed at 06/03/2018 1040 Gross per 24 hour  Intake 4932.29 ml  Output 376 ml  Net 4556.29 ml   Filed Weights   06/02/18 0520 06/02/18 1125  Weight: 63 kg 63 kg    Examination: General: Thin female who responds to verbal stimuli HEENT: Left EJ IV l line in place.  Poor oral care. Neuro: Somewhat stuttered but follows commands CV: s1s2 rrr, no m/r/g PULM: even/non-labored, lungs bilaterally diminished in the bases GI: Distended fluid wave positive tender Extremities: warm/dry, plus edema  Skin: no rashes or lesions   Resolved  Hospital Problem list     Assessment & Plan:  Acute variceal bleed status post banding.  Note liver ultrasound reveals thrombus and clot of portal vein which most likely she cannot have interventional tips.  Therefore little to offer if she has massive bleeding episode. Admitted to the intensive care unit And transfuse with packed red blood cells, fresh frozen plasma, and platelets Serial CBCs Octreotide and Protonix drip Empirical ceftriaxone   Hemorrhagic shock Currently weaning Neo-Synephrine May need central venous line access currently has left EJ And she has blood products  EtOH cirrhosis Portal hypertension.  Now with portal vein thrombus most likely cannot tolerate will be a candidate for TIPS Ascites Last paracentesis was on 05/31/2018 was 7 L removed Interventional radiology is evaluating for possible TIPS Placed on steroids per GI services Stop Toradol  Hepatic Encephalopathy, improved Considering holding lactulose with GI bleed   Best practice:  Diet: N.p.o. Pain/Anxiety/Delirium protocol (if indicated): NA VAP protocol (if indicated): NA DVT prophylaxis: SCDs GI prophylaxis: PPI drip Glucose control: NA Mobility: Bed Code Status: Full Family Communication: 06/03/2018 no family at bedside Disposition: ICU  Labs   CBC: Recent Labs  Lab 05/27/18 1514 06/02/18 0540 06/02/18 1825 06/03/18 0202 06/03/18 1032  WBC 4.8 5.7 6.1 4.2  --   NEUTROABS 3.0 3.1  --   --   --   HGB 11.5* 9.0* 8.1* 7.1* 8.8*  HCT 35.3* 28.2* 25.4* 21.9* 27.3*  MCV 90.3 87.9 88.5 88.7  --   PLT 148.0* 108* 82* 76*  --     Basic  Metabolic Panel: Recent Labs  Lab 05/27/18 1514 06/02/18 0540 06/03/18 0202  NA 134* 135 139  K 4.4 4.6 4.8  CL 101 102 110  CO2 23 19* 17*  GLUCOSE 135* 103* 108*  BUN 25* 25* 26*  CREATININE 1.92* 1.45* 1.41*  CALCIUM 9.0 8.8* 8.4*   GFR: Estimated Creatinine Clearance: 39.8 mL/min (A) (by C-G formula based on SCr of 1.41 mg/dL  (H)). Recent Labs  Lab 05/27/18 1514 06/02/18 0540 06/02/18 1825 06/03/18 0202  WBC 4.8 5.7 6.1 4.2    Liver Function Tests: Recent Labs  Lab 05/27/18 1514 06/02/18 0540  AST 50* 49*  ALT 28 21  ALKPHOS 212* 152*  BILITOT 7.0* 5.6*  PROT 6.9 6.2*  ALBUMIN 2.8* 2.6*   Recent Labs  Lab 06/02/18 0540  LIPASE 29   Recent Labs  Lab 05/27/18 1514 06/02/18 0540  AMMONIA 95* 97*    ABG No results found for: PHART, PCO2ART, PO2ART, HCO3, TCO2, ACIDBASEDEF, O2SAT   Coagulation Profile: Recent Labs  Lab 05/27/18 1514 06/02/18 0540  INR 1.6* 1.64    Cardiac Enzymes: No results for input(s): CKTOTAL, CKMB, CKMBINDEX, TROPONINI in the last 168 hours.  HbA1C: No results found for: HGBA1C  CBG: Recent Labs  Lab 06/02/18 1535 06/03/18 0326 06/03/18 0855  GLUCAP 92 94 107*     App cct 35 min  Richardson Landry Erial Fikes ACNP Maryanna Shape PCCM Pager [phone] till 1 pm If no answer page [phone] 06/03/2018, 11:15 AM

## 2018-06-03 NOTE — Anesthesia Postprocedure Evaluation (Signed)
Anesthesia Post Note  Patient: Katie Holland  Procedure(s) Performed: ESOPHAGOGASTRODUODENOSCOPY (EGD) WITH PROPOFOL (N/A ) ESOPHAGEAL BANDING     Patient location during evaluation: PACU Anesthesia Type: General Level of consciousness: awake and alert Pain management: pain level controlled Vital Signs Assessment: post-procedure vital signs reviewed and stable Respiratory status: spontaneous breathing, nonlabored ventilation and respiratory function stable Cardiovascular status: blood pressure returned to baseline and stable Postop Assessment: no apparent nausea or vomiting Anesthetic complications: no    Last Vitals:  Vitals:   06/03/18 0841 06/03/18 0900  BP: 105/74 96/69  Pulse: 91 92  Resp: 17 19  Temp:    SpO2: 100% 100%    Last Pain:  Vitals:   06/03/18 0646  TempSrc: Oral  PainSc:                  Lidia Collum

## 2018-06-04 ENCOUNTER — Inpatient Hospital Stay (HOSPITAL_COMMUNITY): Payer: Medicare Other

## 2018-06-04 ENCOUNTER — Encounter (HOSPITAL_COMMUNITY): Payer: Self-pay | Admitting: Gastroenterology

## 2018-06-04 ENCOUNTER — Other Ambulatory Visit: Payer: Self-pay

## 2018-06-04 LAB — PREPARE PLATELET PHERESIS
Unit division: 0
Unit division: 0
Unit division: 0

## 2018-06-04 LAB — CBC WITH DIFFERENTIAL/PLATELET
Abs Immature Granulocytes: 0.02 10*3/uL (ref 0.00–0.07)
BASOS ABS: 0 10*3/uL (ref 0.0–0.1)
Basophils Relative: 0 %
Eosinophils Absolute: 0 10*3/uL (ref 0.0–0.5)
Eosinophils Relative: 0 %
HCT: 28.1 % — ABNORMAL LOW (ref 36.0–46.0)
Hemoglobin: 9.1 g/dL — ABNORMAL LOW (ref 12.0–15.0)
IMMATURE GRANULOCYTES: 1 %
Lymphocytes Relative: 8 %
Lymphs Abs: 0.3 10*3/uL — ABNORMAL LOW (ref 0.7–4.0)
MCH: 29.4 pg (ref 26.0–34.0)
MCHC: 32.4 g/dL (ref 30.0–36.0)
MCV: 90.6 fL (ref 80.0–100.0)
Monocytes Absolute: 0.1 10*3/uL (ref 0.1–1.0)
Monocytes Relative: 3 %
NRBC: 0.5 % — AB (ref 0.0–0.2)
Neutro Abs: 3.9 10*3/uL (ref 1.7–7.7)
Neutrophils Relative %: 88 %
Platelets: 63 10*3/uL — ABNORMAL LOW (ref 150–400)
RBC: 3.1 MIL/uL — ABNORMAL LOW (ref 3.87–5.11)
RDW: 19.9 % — ABNORMAL HIGH (ref 11.5–15.5)
WBC: 4.4 10*3/uL (ref 4.0–10.5)

## 2018-06-04 LAB — PREPARE FRESH FROZEN PLASMA: Unit division: 0

## 2018-06-04 LAB — BASIC METABOLIC PANEL
Anion gap: 15 (ref 5–15)
Anion gap: 9 (ref 5–15)
BUN: 20 mg/dL (ref 6–20)
BUN: 19 mg/dL (ref 6–20)
CO2: 13 mmol/L — AB (ref 22–32)
CO2: 20 mmol/L — ABNORMAL LOW (ref 22–32)
Calcium: 6.6 mg/dL — ABNORMAL LOW (ref 8.9–10.3)
Calcium: 8.6 mg/dL — ABNORMAL LOW (ref 8.9–10.3)
Chloride: 114 mmol/L — ABNORMAL HIGH (ref 98–111)
Chloride: 113 mmol/L — ABNORMAL HIGH (ref 98–111)
Creatinine, Ser: 1.07 mg/dL — ABNORMAL HIGH (ref 0.44–1.00)
Creatinine, Ser: 1.13 mg/dL — ABNORMAL HIGH (ref 0.44–1.00)
GFR calc Af Amer: >60 mL/min (ref >60)
GFR calc Af Amer: >60 mL/min (ref >60)
GFR calc non Af Amer: 59 mL/min — ABNORMAL LOW (ref >60)
GFR calc non Af Amer: 55 mL/min — ABNORMAL LOW (ref >60)
Glucose, Bld: 153 mg/dL — ABNORMAL HIGH (ref 70–99)
Glucose, Bld: 147 mg/dL — ABNORMAL HIGH (ref 70–99)
Potassium: 6.4 mmol/L (ref 3.5–5.1)
Potassium: 4.6 mmol/L (ref 3.5–5.1)
SODIUM: 142 mmol/L (ref 135–145)
Sodium: 142 mmol/L (ref 135–145)

## 2018-06-04 LAB — TYPE AND SCREEN
ABO/RH(D): O POS
Antibody Screen: POSITIVE
Donor AG Type: NEGATIVE
Donor AG Type: NEGATIVE
Unit division: 0
Unit division: 0

## 2018-06-04 LAB — HEPATIC FUNCTION PANEL
ALT: 21 U/L (ref 0–44)
AST: 40 U/L (ref 15–41)
Albumin: 3 g/dL — ABNORMAL LOW (ref 3.5–5.0)
Alkaline Phosphatase: 80 U/L (ref 38–126)
Bilirubin, Direct: 2.3 mg/dL — ABNORMAL HIGH (ref 0.0–0.2)
Indirect Bilirubin: 2.4 mg/dL — ABNORMAL HIGH (ref 0.3–0.9)
Total Bilirubin: 4.7 mg/dL — ABNORMAL HIGH (ref 0.3–1.2)
Total Protein: 5.6 g/dL — ABNORMAL LOW (ref 6.5–8.1)

## 2018-06-04 LAB — BPAM RBC
Blood Product Expiration Date: 202001082359
Blood Product Expiration Date: 202001082359
ISSUE DATE / TIME: 201912111235
ISSUE DATE / TIME: 201912120622
Unit Type and Rh: 5100
Unit Type and Rh: 5100

## 2018-06-04 LAB — BPAM FFP
BLOOD PRODUCT EXPIRATION DATE: 201912152359
Blood Product Expiration Date: 201912152359
ISSUE DATE / TIME: 201912121209
ISSUE DATE / TIME: 201912121514
Unit Type and Rh: 600
Unit Type and Rh: 6200

## 2018-06-04 LAB — BPAM PLATELET PHERESIS
Blood Product Expiration Date: 201912132359
Blood Product Expiration Date: 201912132359
Blood Product Expiration Date: 201912142359
ISSUE DATE / TIME: 201912121702
ISSUE DATE / TIME: 201912121816
ISSUE DATE / TIME: 201912130555
UNIT TYPE AND RH: 6200
Unit Type and Rh: 5100
Unit Type and Rh: 6200

## 2018-06-04 LAB — MAGNESIUM: Magnesium: 1.6 mg/dL — ABNORMAL LOW (ref 1.7–2.4)

## 2018-06-04 LAB — PHOSPHORUS: Phosphorus: 3.6 mg/dL (ref 2.5–4.6)

## 2018-06-04 LAB — PROTIME-INR
INR: 1.47
Prothrombin Time: 17.7 seconds — ABNORMAL HIGH (ref 11.4–15.2)

## 2018-06-04 LAB — APTT: aPTT: 27 seconds (ref 24–36)

## 2018-06-04 MED ORDER — PANTOPRAZOLE SODIUM 40 MG IV SOLR
40.0000 mg | Freq: Two times a day (BID) | INTRAVENOUS | Status: DC
  Administered 2018-06-04 – 2018-06-05 (×2): 40 mg via INTRAVENOUS
  Filled 2018-06-04 (×2): qty 40

## 2018-06-04 MED ORDER — SODIUM CHLORIDE 0.9 % IV SOLN
1.0000 g | INTRAVENOUS | Status: DC
  Administered 2018-06-05: 1 g via INTRAVENOUS
  Filled 2018-06-04: qty 10

## 2018-06-04 NOTE — Progress Notes (Signed)
[facility] Progress Note   Chief Complaint:   Cirrhosis, GI bleed    SUBJECTIVE:    no specific complaints. Spoke with RN. Stool still dark. This am a small ring of pink blood around the stool. Having 2-3 loose BMs daily on Lactulose per nurse   ASSESSMENT AND PLAN:   Decompensated ETOH cirrhosis with recurrent ascites / variceal bleed. She is s/p EGD this admission with findings of bleeding grade II varices, s/p banding. Vomited dark red blood night before last. Yesterday received FFP, platelets and other unit of pRBC.   -Hgb stable at 9.1 post transfusion -INR slightly better after FFP (1.64 >>1.47) -Yesterday IR evaluated patient for TIPS procedure. Due to high MELD and portal vein thrombosis she was not felt to be TIPS candidate. IR would reevaluate if MELD score improved. Rechecking liver tests today -asking for diet advancement, will advance to full -continue octreotide through today. Her IV status is tenuous, we can change PPI gtt to BID.  -continue current dose of lactulose and also Xifaxan.    OBJECTIVE:     Vital signs in last 24 hours: Temp:  [97.4 F (36.3 C)-98.7 F (37.1 C)] 98.6 F (37 C) (12/13 0813) Pulse Rate:  [90-135] 97 (12/13 0900) Resp:  [12-36] 23 (12/13 0900) BP: (87-133)/(59-100) 118/93 (12/13 0900) SpO2:  [91 %-100 %] 97 % (12/13 0900) Last BM Date: (P) 06/03/18 General:   Sleepy female in NAD EENT:  Normal hearing, non icteric sclera, conjunctive pink.  Heart:  Regular rate and rhythm.  No lower extremity edema   Pulm: Normal respiratory effort, lungs CTA bilaterally without wheezes or crackles. Abdomen:  Soft, , nontender, largely distended.  Normal bowel sounds.       Neurologic:  Alert , oriented to year but not place.[name] Psych:  Pleasant, cooperative.  Normal mood and affect.   Intake/Output from previous day: 12/12 0701 - 12/13 0700 In: 3082.7 [P.O.:180; I.V.:1821.9; Blood:981; IV Piggyback:99.9] Out: 380  [Urine:380] Intake/Output this shift: Total I/O In: 95.3 [I.V.:95.3] Out: -   Lab Results: Recent Labs    06/02/18 1825 06/03/18 0202 06/03/18 1032 06/04/18 0548  WBC 6.1 4.2  --  4.4  HGB 8.1* 7.1* 8.8* 9.1*  HCT 25.4* 21.9* 27.3* 28.1*  PLT 82* 76*  --  63*   BMET Recent Labs    06/03/18 0202 06/04/18 0319 06/04/18 0548  NA 139 142 142  K 4.8 6.4* 4.6  CL 110 114* 113*  CO2 17* 13* 20*  GLUCOSE 108* 153* 147*  BUN 26* 20 19  CREATININE 1.41* 1.07* 1.13*  CALCIUM 8.4* 6.6* 8.6*   LFT Recent Labs    06/02/18 0540  PROT 6.2*  ALBUMIN 2.6*  AST 49*  ALT 21  ALKPHOS 152*  BILITOT 5.6*   PT/INR Recent Labs    06/02/18 0540 06/04/18 0319  LABPROT 19.2* 17.7*  INR 1.64 1.47   Hepatitis Panel No results for input(s): HEPBSAG, HCVAB, HEPAIGM, HEPBIGM in the last 72 hours.  Dg Chest Port 1 View  Result Date: 06/04/2018 CLINICAL DATA:  Respiratory failure EXAM: PORTABLE CHEST 1 VIEW COMPARISON:  None. FINDINGS: Hypoventilation with decreased lung volume and bibasilar atelectasis. Negative for edema or effusion. Heart size normal. IMPRESSION: Hypoventilation with bibasilar atelectasis. Electronically Signed   By: Franchot Gallo M.D.   On: 06/04/2018 07:58   US Liver Doppler  Result Date: 06/03/2018 CLINICAL DATA:  Cirrhosis, portal hypertension, acute variceal bleeding from esophageal varices, ascites and possible need for  TIPS procedure. Assessment of liver vasculature and portal vein patency prior to possible TIPS. EXAM: DUPLEX ULTRASOUND OF LIVER TECHNIQUE: Color and duplex Doppler ultrasound was performed to evaluate the hepatic in-flow and out-flow vessels. COMPARISON:  CT of the abdomen without contrast on 04/25/2018 FINDINGS: Portal Vein Velocities The portal vein is completely occluded and contains thrombus. There are some collateral vessels in the porta hepatis that appear to reconstitute some intrahepatic portal vein radicles in both right and left lobes.  Findings are consistent with cavernous transformation. Hepatic Vein Velocities Right:  12 cm/sec Middle:  19 cm/sec Left:  30 cm/sec Hepatic Artery Velocity: 196 cm/sec. Hepatic arterial waveform is normal. Elevated velocities are consistent with demonstrated portal vein thrombosis and compensatory increased hepatic arterial flow. Splenic Vein Velocity:  26 cm/sec Varices: No abdominal varices or perisplenic varices visualized by ultrasound. Ascites: Large volume ascites identified in the peritoneal cavity, especially around the liver. The liver to self demonstrates evidence of advanced cirrhosis and is small, fibrotic and shrunken in appearance with grossly nodular surface contour. No gross hepatic masses identified by ultrasound. The spleen is not enlarged. IMPRESSION: 1. Complete thrombosis of the main portal vein with collateral vessels in the porta hepatis that appear to reconstitute intrahepatic portal vein radicles. Findings are consistent with cavernous transformation. Due to what appears to be chronic thrombosis of the portal vein, the patient is likely not a candidate for TIPS. CT of the abdomen and pelvis with contrast may be of additional benefit in reconfirming portal vein thrombosis and assessing for other venous collateral pathways. 2. Compensatory increased hepatic arterial flow. 3. The hepatic veins are patent.  Large volume of ascites present. 4. The liver shows evidence of advanced cirrhosis. Electronically Signed   By: Aletta Edouard M.D.   On: 06/03/2018 08:17     Principal Problem:   Acute upper GI bleeding Active Problems:   Alcoholic cirrhosis of liver with ascites (HCC)   Tobacco dependence   Bleeding esophageal varices (HCC)   Cirrhosis ([place])   Alcohol dependence syndrome ([place])     LOS: 2 days   Tye Savoy ,NP 06/04/2018, 10:21 AM

## 2018-06-04 NOTE — Progress Notes (Signed)
NAME:  Katie Holland, MRN:  [id number], DOB:  [birth year], LOS: 2 ADMISSION DATE:  06/02/2018, CONSULTATION DATE:  06/02/18 REFERRING MD:  Madelon Lips MD, CHIEF COMPLAINT:  GI bleed  Brief History   52 year old with alcohol cirrhosis, esophageal varices presenting with upper GI bleed Underwent EGD, banding.  Became hypotensive during procedure requiring Neo-Synephrine.  PCCM requested to admit to ICU for monitoring.  History of present illness   Complains of vomiting blood overnight.  Reports to episodes of emesis with bright red blood.  Trace history of similar episodes in the past with banding done 11 3 and 11 5.  Past Medical History  Alcohol cirrhosis, esophageal varices, GERD, chronic back pain Considering possible peritoneal carcinomatosis.  However ascites cytology is benign.  Significant Hospital Events   12/11-admit, EGD with banding of varices. 06/03/2018 thrombus of portal vein not a candidate for TIPS Consults:  GI 12/11-  Procedures:  12/11-EGD  Significant Diagnostic Tests:  Liver ultrasound 06/03/2018 which shows thrombus of the portal vein and occlusion.  Micro Data:    Antimicrobials:  Ceftriaxone 12/11>>   Interim history/subjective:    Objective   Blood pressure (!) 118/93, pulse 97, temperature 98.6 F (37 C), temperature source Oral, resp. rate (!) 23, height 5\' 4"  (1.626 m), weight 63 kg, SpO2 97 %.        Intake/Output Summary (Last 24 hours) at 06/04/2018 1056 Last data filed at 06/04/2018 1000 Gross per 24 hour  Intake 2549.9 ml  Output 365 ml  Net 2184.9 ml   Filed Weights   06/02/18 0520 06/02/18 1125  Weight: 63 kg 63 kg    Examination: General: Ill-appearing female in no acute distress no recent reports of bleeding HEENT: No JVD or lymphadenopathy is appreciated Neuro: Communicates on a simple level CV: Sounds are regular PULM: even/non-labored, lungs bilaterally managed GI: Positive bowel sounds, positive fluid wave with  ascites Extremities: warm/dry, edema edema  Skin: no rashes or lesions    Resolved Hospital Problem list     Assessment & Plan:  Acute variceal bleed status post banding.  Note liver ultrasound reveals thrombus and clot of portal vein which most likely she cannot have interventional tips.  Therefore little to offer if she has massive bleeding episode. She has been transfused She has stabilized Transfer to stepdown unit and to try a service as of 06/05/2018   Hemorrhagic shock Off pressor support Hemoglobin on stable Transfer out of the intensive care unit  EtOH cirrhosis Portal hypertension.  Now with portal vein thrombus most likely cannot tolerate will be a candidate for TIPS Ascites Last paracentesis 05/31/2018 with 7 L removed She is not a candidate for TIPS or interventional radiology at this time She remains on a octreotide drip for 1 more day per GI service also on Protonix drip per GI service  Hepatic Encephalopathy, improved Lactulose p.o.   Best practice:  Diet: Diet advanced per GI services on 06/04/2018 Pain/Anxiety/Delirium protocol (if indicated): NA VAP protocol (if indicated): NA DVT prophylaxis: SCDs GI prophylaxis: PPI drip Glucose control: NA Mobility: Bed Code Status: Full, needs discussions about being no CODE BLUE.  06/04/2018 attempted discussions patient is concerning CODE STATUS she wants to be a full code. Family Communication: 06/04/2018 no family at bedside. Disposition: ICU  Labs   CBC: Recent Labs  Lab 06/02/18 0540 06/02/18 1825 06/03/18 0202 06/03/18 1032 06/04/18 0548  WBC 5.7 6.1 4.2  --  4.4  NEUTROABS 3.1  --   --   --  3.9  HGB 9.0* 8.1* 7.1* 8.8* 9.1*  HCT 28.2* 25.4* 21.9* 27.3* 28.1*  MCV 87.9 88.5 88.7  --  90.6  PLT 108* 82* 76*  --  63*    Basic Metabolic Panel: Recent Labs  Lab 06/02/18 0540 06/03/18 0202 06/04/18 0319 06/04/18 0548  NA 135 139 142 142  K 4.6 4.8 6.4* 4.6  CL 102 110 114* 113*  CO2 19*  17* 13* 20*  GLUCOSE 103* 108* 153* 147*  BUN 25* 26* 20 19  CREATININE 1.45* 1.41* 1.07* 1.13*  CALCIUM 8.8* 8.4* 6.6* 8.6*  MG  --   --  1.6*  --   PHOS  --   --  3.6  --    GFR: Estimated Creatinine Clearance: 49.7 mL/min (A) (by C-G formula based on SCr of 1.13 mg/dL (H)). Recent Labs  Lab 06/02/18 0540 06/02/18 1825 06/03/18 0202 06/04/18 0548  WBC 5.7 6.1 4.2 4.4    Liver Function Tests: Recent Labs  Lab 06/02/18 0540  AST 49*  ALT 21  ALKPHOS 152*  BILITOT 5.6*  PROT 6.2*  ALBUMIN 2.6*   Recent Labs  Lab 06/02/18 0540  LIPASE 29   Recent Labs  Lab 06/02/18 0540  AMMONIA 97*    ABG No results found for: PHART, PCO2ART, PO2ART, HCO3, TCO2, ACIDBASEDEF, O2SAT   Coagulation Profile: Recent Labs  Lab 06/02/18 0540 06/04/18 0319  INR 1.64 1.47    Cardiac Enzymes: No results for input(s): CKTOTAL, CKMB, CKMBINDEX, TROPONINI in the last 168 hours.  HbA1C: No results found for: HGBA1C  CBG: Recent Labs  Lab 06/02/18 1535 06/03/18 0326 06/03/18 0855  GLUCAP 92 94 107*     App cct 35 min  Richardson Landry Lauraann Missey ACNP Maryanna Shape PCCM Pager [phone] till 1 pm If no answer page [phone] 06/04/2018, 10:56 AM

## 2018-06-04 NOTE — Progress Notes (Signed)
[facility]  Patient Status: Active (receiving services up to time of hospitalization)  [facility] is providing the following services: RN and PT  If patient discharges after hours, please call [phone].   Katie Holland 06/04/2018, 11:07 AM

## 2018-06-04 NOTE — Plan of Care (Signed)
Pt has diet progressing from cl liq to full liquid - tolerated well. Also discussed with pt about her alcohol intake - last day of drink was 12/10 & how it is detrimental to her health & that it would not be good for her or her body to drink alcohol anymore

## 2018-06-04 NOTE — Progress Notes (Addendum)
Pt transferred from 9M via staff to 5W. Pt is a full code. Vital signs stable, pt placed on PC monitor. Oriented to their room and the unit. Call bell within reach, bed in lowest position. Belongings are in pt's closet. Will continue to monitor.

## 2018-06-04 NOTE — Progress Notes (Signed)
Pt to 5 West bed 38 per bed accompanied by 2 NT -

## 2018-06-04 NOTE — Progress Notes (Signed)
Call to [facility] [place] - gave report to [place] - pt going per bed to 5 West bed 38 with all belongings - glasses in a case with patient

## 2018-06-04 NOTE — Progress Notes (Signed)
[facility] Progress Note Patient Name: Katie Holland DOB: 1965-07-12 MRN: [id number]   Date of Service  06/04/2018  HPI/Events of Note  Notified of K 6.4, creatinine 1.07.  Does not appear to have peaked T waves on telemetry.  eICU Interventions  Ordered an EKG and repeat BMP stat        Shona Needles Yarenis Cerino 06/04/2018, 5:35 AM

## 2018-06-05 ENCOUNTER — Inpatient Hospital Stay (HOSPITAL_COMMUNITY): Payer: Medicare Other

## 2018-06-05 LAB — BASIC METABOLIC PANEL
Anion gap: 7 (ref 5–15)
BUN: 17 mg/dL (ref 6–20)
CO2: 20 mmol/L — ABNORMAL LOW (ref 22–32)
Calcium: 8.7 mg/dL — ABNORMAL LOW (ref 8.9–10.3)
Chloride: 113 mmol/L — ABNORMAL HIGH (ref 98–111)
Creatinine, Ser: 1.01 mg/dL — ABNORMAL HIGH (ref 0.44–1.00)
GFR calc Af Amer: >60 mL/min (ref >60)
GFR calc non Af Amer: >60 mL/min (ref >60)
GLUCOSE: 151 mg/dL — AB (ref 70–99)
POTASSIUM: 4.2 mmol/L (ref 3.5–5.1)
Sodium: 140 mmol/L (ref 135–145)

## 2018-06-05 LAB — CBC
HCT: 31.6 % — ABNORMAL LOW (ref 36.0–46.0)
Hemoglobin: 10 g/dL — ABNORMAL LOW (ref 12.0–15.0)
MCH: 29.3 pg (ref 26.0–34.0)
MCHC: 31.6 g/dL (ref 30.0–36.0)
MCV: 92.7 fL (ref 80.0–100.0)
NRBC: 0 % (ref 0.0–0.2)
Platelets: 92 10*3/uL — ABNORMAL LOW (ref 150–400)
RBC: 3.41 MIL/uL — ABNORMAL LOW (ref 3.87–5.11)
RDW: 20.6 % — ABNORMAL HIGH (ref 11.5–15.5)
WBC: 9.2 10*3/uL (ref 4.0–10.5)

## 2018-06-05 LAB — PHOSPHORUS: Phosphorus: 2.6 mg/dL (ref 2.5–4.6)

## 2018-06-05 LAB — MAGNESIUM: Magnesium: 2.1 mg/dL (ref 1.7–2.4)

## 2018-06-05 MED ORDER — PANTOPRAZOLE SODIUM 40 MG PO TBEC
40.0000 mg | DELAYED_RELEASE_TABLET | Freq: Two times a day (BID) | ORAL | Status: DC
  Administered 2018-06-05 – 2018-06-06 (×2): 40 mg via ORAL
  Filled 2018-06-05 (×2): qty 1

## 2018-06-05 MED ORDER — FUROSEMIDE 20 MG PO TABS
20.0000 mg | ORAL_TABLET | Freq: Every day | ORAL | Status: DC
  Administered 2018-06-05: 20 mg via ORAL
  Filled 2018-06-05: qty 1

## 2018-06-05 MED ORDER — VITAMIN B-1 100 MG PO TABS
100.0000 mg | ORAL_TABLET | Freq: Every day | ORAL | Status: DC
  Administered 2018-06-06: 100 mg via ORAL
  Filled 2018-06-05: qty 1

## 2018-06-05 MED ORDER — SPIRONOLACTONE 25 MG PO TABS
50.0000 mg | ORAL_TABLET | Freq: Every day | ORAL | Status: DC
  Administered 2018-06-05: 50 mg via ORAL
  Filled 2018-06-05: qty 2

## 2018-06-05 MED ORDER — THIAMINE HCL 100 MG/ML IJ SOLN: 100.0000 mg | Freq: Every day | INTRAMUSCULAR | Status: DC

## 2018-06-05 MED ORDER — SPIRONOLACTONE 25 MG PO TABS
100.0000 mg | ORAL_TABLET | Freq: Every day | ORAL | Status: DC
  Administered 2018-06-06: 100 mg via ORAL
  Filled 2018-06-05: qty 4

## 2018-06-05 MED ORDER — FOLIC ACID 1 MG PO TABS
1.0000 mg | ORAL_TABLET | Freq: Every day | ORAL | Status: DC
  Administered 2018-06-06: 1 mg via ORAL
  Filled 2018-06-05: qty 1

## 2018-06-05 MED ORDER — FUROSEMIDE 40 MG PO TABS
40.0000 mg | ORAL_TABLET | Freq: Every day | ORAL | Status: DC
  Administered 2018-06-06: 40 mg via ORAL
  Filled 2018-06-05: qty 1

## 2018-06-05 NOTE — Progress Notes (Addendum)
[facility] Progress Note   Chief Complaint:   Cirrhosis, GI bleed   SUBJECTIVE:    feels okay. No abdominal pain. No N/V. No further bleeding. She is tolerating full liquid and hopes to go home soon. Having BMs on lactulose. She looks the best I have seen her.    ASSESSMENT AND PLAN:   Decompensated ETOH cirrhosis with recurrent ascites / variceal bleed. She is s/p EGDthis admission with banding of bleeding grade II varices.  Vomited dark red blood on evening of procedure 06/02/18 but no bleeding since. Hgb has remained stable at 10 .1 post pRBCs. MELD 17, an improvement though the FFP did lower INR contributing some to the better score. -Right now patient is not candidate for TIPS given MELD and portal vein thrombosis. IR will reconsider if MELD improves. -continue lactulose and Xifaxan -Octreotide tx complete -Will change IV PPI to PO -tomorrow will be 5th day of antibiotics for SBP prophylaxis and that should be ample.  -can perform therapeutic LVP prior to discharge.  -ETOH avoidance is paramount -will need to restart diuretics at some point   OBJECTIVE:     Vital signs in last 24 hours: Temp:  [97.6 F (36.4 C)-98.2 F (36.8 C)] 98.2 F (36.8 C) (12/14 0415) Pulse Rate:  [92-111] 111 (12/14 0415) Resp:  [18-20] 18 (12/14 0415) BP: (100-131)/(76-97) 102/76 (12/14 0415) SpO2:  [97 %-100 %] 97 % (12/14 0415) Last BM Date: 06/04/18 General:   Alert, female in NAD. Smiling. Oriented Heart:  Regular rate and rhythm.  No lower extremity edema   Pulm: Normal respiratory effort, lungs CTA bilaterally without wheezes or crackles. Abdomen:  Soft, distended, nontender.  Normal bowel sounds.       Neurologic:  Alert and  oriented x4;  grossly normal neurologically. Psych:  Pleasant, cooperative.  Normal mood and affect.  Review of systems: No chest pain or dyspnea Generalized fatigue Remainder of systems negative except as above  Intake/Output from  previous day: 12/13 0701 - 12/14 0700 In: 1422.5 [P.O.:600; I.V.:617.4; IV Piggyback:205.1] Out: 180 [Urine:180] Intake/Output this shift: No intake/output data recorded.  Lab Results: Recent Labs    06/03/18 0202 06/03/18 1032 06/04/18 0548 06/05/18 0727  WBC 4.2  --  4.4 9.2  HGB 7.1* 8.8* 9.1* 10.0*  HCT 21.9* 27.3* 28.1* 31.6*  PLT 76*  --  63* 92*   BMET Recent Labs    06/04/18 0319 06/04/18 0548 06/05/18 0219  NA 142 142 140  K 6.4* 4.6 4.2  CL 114* 113* 113*  CO2 13* 20* 20*  GLUCOSE 153* 147* 151*  BUN 20 19 17   CREATININE 1.07* 1.13* 1.01*  CALCIUM 6.6* 8.6* 8.7*   LFT Recent Labs    06/04/18 0548  PROT 5.6*  ALBUMIN 3.0*  AST 40  ALT 21  ALKPHOS 80  BILITOT 4.7*  BILIDIR 2.3*  IBILI 2.4*   PT/INR Recent Labs    06/04/18 0319  LABPROT 17.7*  INR 1.47   Hepatitis Panel No results for input(s): HEPBSAG, HCVAB, HEPAIGM, HEPBIGM in the last 72 hours.  Dg Chest Port 1 View  Result Date: 06/05/2018 CLINICAL DATA:  Respiratory failure. EXAM: PORTABLE CHEST 1 VIEW COMPARISON:  Radiograph June 04, 2018. FINDINGS: The heart size and mediastinal contours are within normal limits. Both lungs are clear. No pneumothorax or pleural effusion is noted. The visualized skeletal structures are unremarkable. IMPRESSION: No active disease. Electronically Signed   By: Marijo Conception, M.D.   On:  06/05/2018 09:08   Dg Chest Port 1 View  Result Date: 06/04/2018 CLINICAL DATA:  Respiratory failure EXAM: PORTABLE CHEST 1 VIEW COMPARISON:  None. FINDINGS: Hypoventilation with decreased lung volume and bibasilar atelectasis. Negative for edema or effusion. Heart size normal. IMPRESSION: Hypoventilation with bibasilar atelectasis. Electronically Signed   By: Franchot Gallo M.D.   On: 06/04/2018 07:58     Principal Problem:   Acute upper GI bleeding Active Problems:   Alcoholic cirrhosis of liver with ascites (HCC)   Tobacco dependence   Bleeding esophageal  varices (HCC)   Cirrhosis ([place])   Alcohol dependence syndrome ([place])     LOS: 3 days   Tye Savoy ,NP 06/05/2018, 9:32 AM   I have discussed the case with the PA, and that is the plan I formulated. I personally interviewed and examined the patient.  No further bleeding, renal function stable, diuretics resumed large volume ascites that is not tense.  Agree that if she remains stable tomorrow she can be discharged home for those outpatient follow-up with our clinic.  I will message Dr. Havery Moros to coordinate that, and I think the patient will likely need an elective outpatient therapeutic paracentesis bout a week after discharge.  Her volume retention is increased due to the amount of fluids received during admission.  Advance to soft diet today.  She should stay on that for a week, then resume regular diet.  She will need repeat EGD in several weeks to band any remaining varices also to be coordinated when she follows up with our clinic.  Total time 25 minutes.  Nelida Meuse III Office: [phone]

## 2018-06-05 NOTE — Progress Notes (Signed)
PROGRESS NOTE        PATIENT DETAILS Name: Katie Holland Age: 52 y.o. Sex: female Date of Birth: 1965/10/28 Admit Date: 06/02/2018 Admitting Physician Marshell Garfinkel, MD JSE:[id number], Langley Adie, MD  Brief Narrative: Patient is a 52 y.o. female with history of alcoholic liver cirrhosis, ongoing alcohol use-presented with upper GI bleeding secondary to variceal bleeding.  Hospital course complicated by development of hemorrhagic shock requiring multiple units of PRBC and transient pressure support.  Once stabilized in the ICU-she was transferred to the [facility] hospitalist service on 12/14.  See below for further details  Subjective: Feels better-claims that she has decided to completely quit alcohol.  Stools were brown earlier this morning.  Denies any abdominal pain.  Assessment/Plan: Hemorrhagic shock: Secondary to variceal bleeding-required multiple units of PRBC transfusion and transient pressure support.  Was monitored and managed by PCCM in the ICU.  Currently her blood pressure is much stable.  Upper GI bleeding due to variceal bleeding with acute blood loss anemia: Brown stool this morning-upper GI bleeding has resolved.  She underwent EGD with banding on 12/11.  Required several units of blood products including PRBC, FFP and platelets.  Per GI/IR-not a candidate for TIPS.  Remains on IV Rocephin for SBP prophylaxis-has been discontinued off octreotide earlier this morning.  Alcoholic liver cirrhosis: Unfortunately continues to drink alcohol-poor overall prognosis.  Appears to have significant amount of ascites but abdomen is not tense.  Will restart Aldactone/Lasix follow blood pressure trend given significant ascites.   Remains on lactulose and rifaximin-is completely awake and alert.  Thrombocytopenia: Likely secondary to hypersplenism/liver cirrhosis-stable for close monitoring at this time.  History of alcoholic hepatitis: On 40 mg daily of prednisolone-plans  were to complete a 28-day course.  Alcohol abuse: No signs of withdrawal-on Ativan per protocol.  DVT Prophylaxis:  SCD's  Code Status: Full code or DNR  Family Communication: None at bedside  Disposition Plan: Remain inpatient-shown in the next 1-2 days if clinical improvement continues  Antimicrobial agents: Anti-infectives (From admission, onward)   Start     Dose/Rate Route Frequency Ordered Stop   06/05/18 1000  cefTRIAXone (ROCEPHIN) 1 g in sodium chloride 0.9 % 100 mL IVPB     1 g 200 mL/hr over 30 Minutes Intravenous Every 24 hours 06/04/18 1947 06/07/18 0959   06/02/18 1000  rifaximin (XIFAXAN) tablet 550 mg     550 mg Oral 2 times daily 06/02/18 0905     06/02/18 1000  cefTRIAXone (ROCEPHIN) 1 g in sodium chloride 0.9 % 100 mL IVPB     1 g 200 mL/hr over 30 Minutes Intravenous Every 24 hours 06/02/18 0922 06/05/18 0900      Procedures: 12/10>>EGD with banding  CONSULTS:  GI  Time spent: 25- minutes-Greater than 50% of this time was spent in counseling, explanation of diagnosis, planning of further management, and coordination of care.  MEDICATIONS: Scheduled Meds: . sodium chloride   Intravenous Once  . folic acid  1 mg Intravenous Daily  . furosemide  20 mg Oral Daily  . lactulose  20 g Oral TID  . pantoprazole (PROTONIX) IV  40 mg Intravenous Q12H  . phytonadione  10 mg Subcutaneous Q2000  . prednisoLONE  40 mg Oral Daily  . rifaximin  550 mg Oral BID  . spironolactone  50 mg Oral Daily  . thiamine injection  100 mg Intravenous Daily   Continuous Infusions: . cefTRIAXone (ROCEPHIN)  IV 1 g (06/05/18 0852)   PRN Meds:.LORazepam, ondansetron **OR** ondansetron (ZOFRAN) IV   PHYSICAL EXAM: Vital signs: Vitals:   06/04/18 1300 06/04/18 1500 06/04/18 2141 06/05/18 0415  BP: 100/80 (!) 131/97 (!) 118/94 102/76  Pulse: 92 (!) 103 97 (!) 111  Resp: 18 20 18 18   Temp:  98.1 F (36.7 C) 98 F (36.7 C) 98.2 F (36.8 C)  TempSrc:  Oral Oral     SpO2: 97% 100% 99% 97%  Weight:      Height:       Filed Weights   06/02/18 0520 06/02/18 1125  Weight: 63 kg 63 kg   Body mass index is 23.84 kg/m.   General appearance :Awake, alert, not in any distress.  HEENT: Atraumatic and Normocephalic Neck: supple Resp:Good air entry bilaterally, no added sounds  CVS: S1 S2 regular, no murmurs.  GI: Bowel sounds present, Non tender-but distended (not tense)-fluid thrill present Extremities: B/L Lower Ext shows no edema, both legs are warm to touch Neurology:  speech clear,Non focal, sensation is grossly intact. Psychiatric: Normal judgment and insight. Alert and oriented x 3. Normal mood. Musculoskeletal:No digital cyanosis Skin:No Rash, warm and dry Wounds:N/A  I have personally reviewed following labs and imaging studies  LABORATORY DATA: CBC: Recent Labs  Lab 06/02/18 0540 06/02/18 1825 06/03/18 0202 06/03/18 1032 06/04/18 0548 06/05/18 0727  WBC 5.7 6.1 4.2  --  4.4 9.2  NEUTROABS 3.1  --   --   --  3.9  --   HGB 9.0* 8.1* 7.1* 8.8* 9.1* 10.0*  HCT 28.2* 25.4* 21.9* 27.3* 28.1* 31.6*  MCV 87.9 88.5 88.7  --  90.6 92.7  PLT 108* 82* 76*  --  63* 92*    Basic Metabolic Panel: Recent Labs  Lab 06/02/18 0540 06/03/18 0202 06/04/18 0319 06/04/18 0548 06/05/18 0219  NA 135 139 142 142 140  K 4.6 4.8 6.4* 4.6 4.2  CL 102 110 114* 113* 113*  CO2 19* 17* 13* 20* 20*  GLUCOSE 103* 108* 153* 147* 151*  BUN 25* 26* 20 19 17   CREATININE 1.45* 1.41* 1.07* 1.13* 1.01*  CALCIUM 8.8* 8.4* 6.6* 8.6* 8.7*  MG  --   --  1.6*  --  2.1  PHOS  --   --  3.6  --  2.6    GFR: Estimated Creatinine Clearance: 55.6 mL/min (A) (by C-G formula based on SCr of 1.01 mg/dL (H)).  Liver Function Tests: Recent Labs  Lab 06/02/18 0540 06/04/18 0548  AST 49* 40  ALT 21 21  ALKPHOS 152* 80  BILITOT 5.6* 4.7*  PROT 6.2* 5.6*  ALBUMIN 2.6* 3.0*   Recent Labs  Lab 06/02/18 0540  LIPASE 29   Recent Labs  Lab 06/02/18 0540   AMMONIA 97*    Coagulation Profile: Recent Labs  Lab 06/02/18 0540 06/04/18 0319  INR 1.64 1.47    Cardiac Enzymes: No results for input(s): CKTOTAL, CKMB, CKMBINDEX, TROPONINI in the last 168 hours.  BNP (last 3 results) No results for input(s): PROBNP in the last 8760 hours.  HbA1C: No results for input(s): HGBA1C in the last 72 hours.  CBG: Recent Labs  Lab 06/02/18 1535 06/03/18 0326 06/03/18 0855  GLUCAP 92 94 107*    Lipid Profile: No results for input(s): CHOL, HDL, LDLCALC, TRIG, CHOLHDL, LDLDIRECT in the last 72 hours.  Thyroid Function Tests: No results for input(s): TSH, T4TOTAL, FREET4, T3FREE,  THYROIDAB in the last 72 hours.  Anemia Panel: No results for input(s): VITAMINB12, FOLATE, FERRITIN, TIBC, IRON, RETICCTPCT in the last 72 hours.  Urine analysis:    Component Value Date/Time   COLORURINE AMBER (A) 05/17/2018 1630   APPEARANCEUR HAZY (A) 05/17/2018 1630   LABSPEC 1.010 05/17/2018 1630   PHURINE 5.0 05/17/2018 1630   GLUCOSEU NEGATIVE 05/17/2018 1630   HGBUR NEGATIVE 05/17/2018 1630   BILIRUBINUR NEGATIVE 05/17/2018 1630   KETONESUR NEGATIVE 05/17/2018 1630   PROTEINUR NEGATIVE 05/17/2018 1630   NITRITE NEGATIVE 05/17/2018 1630   LEUKOCYTESUR SMALL (A) 05/17/2018 1630    Sepsis Labs: Lactic Acid, Venous No results found for: LATICACIDVEN  MICROBIOLOGY: Recent Results (from the past 240 hour(s))  MRSA PCR Screening     Status: None   Collection Time: 06/02/18  1:56 PM  Result Value Ref Range Status   MRSA by PCR NEGATIVE NEGATIVE Final    Comment:        The GeneXpert MRSA Assay (FDA approved for NASAL specimens only), is one component of a comprehensive MRSA colonization surveillance program. It is not intended to diagnose MRSA infection nor to guide or monitor treatment for MRSA infections. Performed at [facility], [place] [address], [place], [place] [address]     RADIOLOGY STUDIES/RESULTS: Dg Chest Port 1  View  Result Date: 06/05/2018 CLINICAL DATA:  Respiratory failure. EXAM: PORTABLE CHEST 1 VIEW COMPARISON:  Radiograph June 04, 2018. FINDINGS: The heart size and mediastinal contours are within normal limits. Both lungs are clear. No pneumothorax or pleural effusion is noted. The visualized skeletal structures are unremarkable. IMPRESSION: No active disease. Electronically Signed   By: Marijo Conception, M.D.   On: 06/05/2018 09:08   Dg Chest Port 1 View  Result Date: 06/04/2018 CLINICAL DATA:  Respiratory failure EXAM: PORTABLE CHEST 1 VIEW COMPARISON:  None. FINDINGS: Hypoventilation with decreased lung volume and bibasilar atelectasis. Negative for edema or effusion. Heart size normal. IMPRESSION: Hypoventilation with bibasilar atelectasis. Electronically Signed   By: Franchot Gallo M.D.   On: 06/04/2018 07:58   US Liver Doppler  Result Date: 06/03/2018 CLINICAL DATA:  Cirrhosis, portal hypertension, acute variceal bleeding from esophageal varices, ascites and possible need for TIPS procedure. Assessment of liver vasculature and portal vein patency prior to possible TIPS. EXAM: DUPLEX ULTRASOUND OF LIVER TECHNIQUE: Color and duplex Doppler ultrasound was performed to evaluate the hepatic in-flow and out-flow vessels. COMPARISON:  CT of the abdomen without contrast on 04/25/2018 FINDINGS: Portal Vein Velocities The portal vein is completely occluded and contains thrombus. There are some collateral vessels in the porta hepatis that appear to reconstitute some intrahepatic portal vein radicles in both right and left lobes. Findings are consistent with cavernous transformation. Hepatic Vein Velocities Right:  12 cm/sec Middle:  19 cm/sec Left:  30 cm/sec Hepatic Artery Velocity: 196 cm/sec. Hepatic arterial waveform is normal. Elevated velocities are consistent with demonstrated portal vein thrombosis and compensatory increased hepatic arterial flow. Splenic Vein Velocity:  26 cm/sec Varices: No  abdominal varices or perisplenic varices visualized by ultrasound. Ascites: Large volume ascites identified in the peritoneal cavity, especially around the liver. The liver to self demonstrates evidence of advanced cirrhosis and is small, fibrotic and shrunken in appearance with grossly nodular surface contour. No gross hepatic masses identified by ultrasound. The spleen is not enlarged. IMPRESSION: 1. Complete thrombosis of the main portal vein with collateral vessels in the porta hepatis that appear to reconstitute intrahepatic portal vein radicles. Findings are  consistent with cavernous transformation. Due to what appears to be chronic thrombosis of the portal vein, the patient is likely not a candidate for TIPS. CT of the abdomen and pelvis with contrast may be of additional benefit in reconfirming portal vein thrombosis and assessing for other venous collateral pathways. 2. Compensatory increased hepatic arterial flow. 3. The hepatic veins are patent.  Large volume of ascites present. 4. The liver shows evidence of advanced cirrhosis. Electronically Signed   By: Aletta Edouard M.D.   On: 06/03/2018 08:17   Ir Paracentesis  Result Date: 05/31/2018 INDICATION: History of ETOH cirrhosis with recurrent ascites, esophageal varices, GI bleed and encephalopathy. Request for diagnostic and therapeutic paracentesis today. EXAM: ULTRASOUND GUIDED PARACENTESIS MEDICATIONS: 1% lidocaine 10 mL COMPLICATIONS: None immediate. PROCEDURE: Informed written consent was obtained from the patient after a discussion of the risks, benefits and alternatives to treatment. A timeout was performed prior to the initiation of the procedure. Initial ultrasound scanning demonstrates a large amount of ascites within the right lower abdominal quadrant. The right lower abdomen was prepped and draped in the usual sterile fashion. 1% lidocaine with epinephrine was used for local anesthesia. Following this, a 19 gauge, 7-cm, Yueh catheter was  introduced. An ultrasound image was saved for documentation purposes. The paracentesis was performed. The catheter was removed and a dressing was applied. The patient tolerated the procedure well without immediate post procedural complication. FINDINGS: A total of approximately 7 L of clear yellow fluid was removed. Samples were sent to the laboratory as requested by the clinical team. IMPRESSION: Successful ultrasound-guided paracentesis yielding 7 liters of peritoneal fluid. Read by: Gareth Eagle, PA-C Electronically Signed   By: Markus Daft M.D.   On: 05/31/2018 14:12   Ir Paracentesis  Result Date: 05/18/2018 INDICATION: Ascites secondary to alcoholic liver disease. Request for therapeutic paracentesis. EXAM: ULTRASOUND GUIDED PARACENTESIS MEDICATIONS: 1% lidocaine 10 mL COMPLICATIONS: None immediate. PROCEDURE: Informed written consent was obtained from the patient after a discussion of the risks, benefits and alternatives to treatment. A timeout was performed prior to the initiation of the procedure. Initial ultrasound scanning demonstrates a large amount of ascites within the right lower abdominal quadrant. The right lower abdomen was prepped and draped in the usual sterile fashion. 1% lidocaine with epinephrine was used for local anesthesia. Following this, a 19 gauge, 7-cm, Yueh catheter was introduced. An ultrasound image was saved for documentation purposes. The paracentesis was performed. The catheter was removed and a dressing was applied. The patient tolerated the procedure well without immediate post procedural complication. FINDINGS: A total of approximately 6.5 L of clear yellow fluid was removed. IMPRESSION: Successful ultrasound-guided paracentesis yielding 6.5 liters of peritoneal fluid. Read by: Gareth Eagle, PA-C Electronically Signed   By: Markus Daft M.D.   On: 05/18/2018 15:42     LOS: 3 days   Oren Binet, MD  [facility]  If 7PM-7AM, please contact  night-coverage  Please page via [email or web address]-click on MD name and type text message  06/05/2018, 11:20 AM

## 2018-06-05 NOTE — Progress Notes (Signed)
  Progress Note   Date: 06/05/2018  Patient Name: Katie Holland        MRN#: [id number]   Potassium was 4.6, no intervention needed      Asa Saunas, NP

## 2018-06-06 ENCOUNTER — Inpatient Hospital Stay (HOSPITAL_COMMUNITY): Payer: Medicare Other

## 2018-06-06 LAB — CBC
HCT: 26.7 % — ABNORMAL LOW (ref 36.0–46.0)
Hemoglobin: 8.5 g/dL — ABNORMAL LOW (ref 12.0–15.0)
MCH: 29.3 pg (ref 26.0–34.0)
MCHC: 31.8 g/dL (ref 30.0–36.0)
MCV: 92.1 fL (ref 80.0–100.0)
Platelets: 79 10*3/uL — ABNORMAL LOW (ref 150–400)
RBC: 2.9 MIL/uL — ABNORMAL LOW (ref 3.87–5.11)
RDW: 20.6 % — ABNORMAL HIGH (ref 11.5–15.5)
WBC: 8.2 10*3/uL (ref 4.0–10.5)
nRBC: 0 % (ref 0.0–0.2)

## 2018-06-06 MED ORDER — LIDOCAINE-EPINEPHRINE 1 %-1:100000 IJ SOLN
INTRAMUSCULAR | Status: AC
  Filled 2018-06-06: qty 1

## 2018-06-06 MED ORDER — PANTOPRAZOLE SODIUM 40 MG PO TBEC: 40.0000 mg | DELAYED_RELEASE_TABLET | Freq: Two times a day (BID) | ORAL | 3 refills | Status: DC

## 2018-06-06 MED ORDER — ALBUMIN HUMAN 25 % IV SOLN
50.0000 g | Freq: Once | INTRAVENOUS | Status: AC
  Administered 2018-06-06: 50 g via INTRAVENOUS
  Filled 2018-06-06: qty 100

## 2018-06-06 NOTE — Procedures (Signed)
Pre Procedural Dx: Symptomatic Ascites Post Procedural Dx: Same  Successful US guided paracentesis yielding 4 L of serous ascitic fluid.  EBL: None Complications: None immediate  Jay Merville Hijazi, MD Pager #: [phone]   

## 2018-06-06 NOTE — Progress Notes (Signed)
Resumption of services order for [facility] placed, [facility] will follow after DC. No other CM needs identified.

## 2018-06-06 NOTE — Discharge Summary (Signed)
PATIENT DETAILS Name: Katie Holland Age: 52 y.o. Sex: female Date of Birth: 1965/12/01 MRN: [id number]. Admitting Physician: Marshell Garfinkel, MD [id number], Langley Adie, MD  Admit Date: 06/02/2018 Discharge date: 06/06/2018  Recommendations for Outpatient Follow-up:  1. Follow up with PCP in 1-2 weeks 2. Please obtain BMP/CBC in one week 3. Please follow up with gastroenterology-probably requires a second look endoscopy to see if repeat banding is required.  Admitted From:  Home  Disposition: [place]: No  Equipment/Devices: None  Discharge Condition: Stable  CODE STATUS: FULL CODE  Diet recommendation:  Heart Healthy   Brief Summary: See H&P, Labs, Consult and Test reports for all details in brief, Patient is a 52 y.o. female with history of alcoholic liver cirrhosis, ongoing alcohol use-presented with upper GI bleeding secondary to variceal bleeding.  Hospital course complicated by development of hemorrhagic shock requiring multiple units of PRBC and transient pressure support.  Once stabilized in the ICU-she was transferred to the [facility] hospitalist service on 12/14.  See below for further details  Brief Hospital Course: Hemorrhagic shock:  Resolved following EGD banding and PRBC transfusion.  Secondary to variceal bleeding-required multiple units of PRBC transfusion and transient pressor support.    Pressure now stable.  Upper GI bleeding due to variceal bleeding with acute blood loss anemia:  Resolved-brown stools this morning.  Hemoglobin stable.  She underwent EGD with banding on 12/11.  Required several units of blood products including PRBC, FFP and platelets.  Per GI/IR-not a candidate for TIPS.    Completed a course of IV Rocephin for SBP prophylaxis.  Also was briefly on IV octreotide and IV PPI that have since been discontinued.  Spoke with GI-okay for discharge-no further recommendations while inpatient-they will arrange for outpatient follow-up, may  require a second endoscopy in the next few weeks for repeat banding.    Alcoholic liver cirrhosis: Unfortunately continues to drink alcohol-poor overall prognosis.  Due to worsening ascites while on diuretics-have ordered paracentesis on 12/15-she will be subsequently discharged home after that.  On discharge she will continue on her usual dosing of Aldactone, Lasix.  She is completely awake and alert-with no signs of hepatic encephalopathy-she will continue with lactulose and rifaximin.  Portal venous thrombosis: Secondary to liver cirrhosis-not a candidate for anticoagulation given severe GI bleeding with hemorrhagic shock and acute blood loss anemia.  Thrombocytopenia: Likely secondary to hypersplenism/liver cirrhosis-stable for close monitoring at this time.  History of alcoholic hepatitis: On 40 mg daily of prednisolone-plans were to complete a 28-day course.  Alcohol abuse: No signs of withdrawal-aged with Ativan per protocol.  Has been counseled extensively regarding the importance of completely abstaining from alcohol use  Procedures/Studies: 12/15>> ultrasound-guided paracentesis by IR 12/11>> EGD with banding  Discharge Diagnoses:  Principal Problem:   Acute upper GI bleeding Active Problems:   Alcoholic cirrhosis of liver with ascites ([place])   Tobacco dependence   Bleeding esophageal varices ([facility])   Cirrhosis ([place])   Alcohol dependence syndrome ([place])   Discharge Instructions:  Activity:  As tolerated with Full fall precautions use walker/cane & assistance as needed  Discharge Instructions    Call MD for:   Complete by:  As directed    Bloody stools, black stools or if you vomit blood   Call MD for:  persistant nausea and vomiting   Complete by:  As directed    Call MD for:  severe uncontrolled pain   Complete by:  As directed    Diet -  low sodium heart healthy   Complete by:  As directed    Discharge instructions   Complete by:  As directed    Follow with  Primary MD  Billie Ruddy, MD in 1 week  Follow-up with your primary gastroenterologist in 1 week  Abstain from using further alcohol use  Please get a complete blood count and chemistry panel checked by your Primary MD at your next visit, and again as instructed by your Primary MD.  Get Medicines reviewed and adjusted: Please take all your medications with you for your next visit with your Primary MD  Laboratory/radiological data: Please request your Primary MD to go over all hospital tests and procedure/radiological results at the follow up, please ask your Primary MD to get all Hospital records sent to his/her office.  In some cases, they will be blood work, cultures and biopsy results pending at the time of your discharge. Please request that your primary care M.D. follows up on these results.  Also Note the following: If you experience worsening of your admission symptoms, develop shortness of breath, life threatening emergency, suicidal or homicidal thoughts you must seek medical attention immediately by calling 911 or calling your MD immediately  if symptoms less severe.  You must read complete instructions/literature along with all the possible adverse reactions/side effects for all the Medicines you take and that have been prescribed to you. Take any new Medicines after you have completely understood and accpet all the possible adverse reactions/side effects.   Do not drive when taking Pain medications or sleeping medications (Benzodaizepines)  Do not take more than prescribed Pain, Sleep and Anxiety Medications. It is not advisable to combine anxiety,sleep and pain medications without talking with your primary care practitioner  Special Instructions: If you have smoked or chewed Tobacco  in the last 2 yrs please stop smoking, stop any regular Alcohol  and or any Recreational drug use.  Wear Seat belts while driving.  Please note: You were cared for by a hospitalist during  your hospital stay. Once you are discharged, your primary care physician will handle any further medical issues. Please note that NO REFILLS for any discharge medications will be authorized once you are discharged, as it is imperative that you return to your primary care physician (or establish a relationship with a primary care physician if you do not have one) for your post hospital discharge needs so that they can reassess your need for medications and monitor your lab values.   Increase activity slowly   Complete by:  As directed      Allergies as of 06/06/2018      Reactions   Contrast Media [iodinated Diagnostic Agents] Itching, Swelling   Latex Itching, Swelling   No breathing impairment, however   Sulfa Antibiotics Itching, Swelling   No breathing impairment, however      Medication List    TAKE these medications   ferrous sulfate 325 (65 FE) MG EC tablet Take 1 tablet (325 mg total) by mouth 2 (two) times daily.   folic acid 1 MG tablet Commonly known as:  FOLVITE Take 1 tablet (1 mg total) by mouth daily.   furosemide 20 MG tablet Commonly known as:  LASIX Take 3 tablets (60 mg total) by mouth daily.   Lactulose 20 GM/30ML Soln Take 45 mLs (30 g total) by mouth 3 (three) times daily. Take 30 mls every eight hours. Titrate for 3 bm per 24 hrs   multivitamin with minerals Tabs tablet  Take 1 tablet by mouth daily.   pantoprazole 40 MG tablet Commonly known as:  PROTONIX Take 1 tablet (40 mg total) by mouth 2 (two) times daily. What changed:  when to take this   potassium chloride 10 MEQ tablet Commonly known as:  K-DUR Take 1 tablet (10 mEq total) by mouth daily.   prednisoLONE 5 MG Tabs tablet Take 8 tablets (40 mg total) by mouth daily.   rifaximin 550 MG Tabs tablet Commonly known as:  XIFAXAN Take 1 tablet (550 mg total) by mouth 2 (two) times daily.   spironolactone 100 MG tablet Commonly known as:  ALDACTONE Take 1.5 tablets (150 mg total) by mouth  daily.   thiamine 100 MG tablet Take 1 tablet (100 mg total) by mouth daily.      Follow-up Information    primary care MD. Schedule an appointment as soon as possible for a visit in 1 week(s).        Armbruster, Carlota Raspberry, MD. Schedule an appointment as soon as possible for a visit in 1 week(s).   Specialty:  Gastroenterology Contact information: [address] [place] [address] [phone]          Allergies  Allergen Reactions  . Contrast Media [Iodinated Diagnostic Agents] Itching and Swelling  . Latex Itching and Swelling    No breathing impairment, however  . Sulfa Antibiotics Itching and Swelling    No breathing impairment, however    Consultations:   pulmonary/intensive care, GI and IR   Other Procedures/Studies: Dg Chest Port 1 View  Result Date: 06/05/2018 CLINICAL DATA:  Respiratory failure. EXAM: PORTABLE CHEST 1 VIEW COMPARISON:  Radiograph June 04, 2018. FINDINGS: The heart size and mediastinal contours are within normal limits. Both lungs are clear. No pneumothorax or pleural effusion is noted. The visualized skeletal structures are unremarkable. IMPRESSION: No active disease. Electronically Signed   By: Marijo Conception, M.D.   On: 06/05/2018 09:08   Dg Chest Port 1 View  Result Date: 06/04/2018 CLINICAL DATA:  Respiratory failure EXAM: PORTABLE CHEST 1 VIEW COMPARISON:  None. FINDINGS: Hypoventilation with decreased lung volume and bibasilar atelectasis. Negative for edema or effusion. Heart size normal. IMPRESSION: Hypoventilation with bibasilar atelectasis. Electronically Signed   By: Franchot Gallo M.D.   On: 06/04/2018 07:58   US Liver Doppler  Result Date: 06/03/2018 CLINICAL DATA:  Cirrhosis, portal hypertension, acute variceal bleeding from esophageal varices, ascites and possible need for TIPS procedure. Assessment of liver vasculature and portal vein patency prior to possible TIPS. EXAM: DUPLEX ULTRASOUND OF LIVER TECHNIQUE:  Color and duplex Doppler ultrasound was performed to evaluate the hepatic in-flow and out-flow vessels. COMPARISON:  CT of the abdomen without contrast on 04/25/2018 FINDINGS: Portal Vein Velocities The portal vein is completely occluded and contains thrombus. There are some collateral vessels in the porta hepatis that appear to reconstitute some intrahepatic portal vein radicles in both right and left lobes. Findings are consistent with cavernous transformation. Hepatic Vein Velocities Right:  12 cm/sec Middle:  19 cm/sec Left:  30 cm/sec Hepatic Artery Velocity: 196 cm/sec. Hepatic arterial waveform is normal. Elevated velocities are consistent with demonstrated portal vein thrombosis and compensatory increased hepatic arterial flow. Splenic Vein Velocity:  26 cm/sec Varices: No abdominal varices or perisplenic varices visualized by ultrasound. Ascites: Large volume ascites identified in the peritoneal cavity, especially around the liver. The liver to self demonstrates evidence of advanced cirrhosis and is small, fibrotic and shrunken in appearance with grossly  nodular surface contour. No gross hepatic masses identified by ultrasound. The spleen is not enlarged. IMPRESSION: 1. Complete thrombosis of the main portal vein with collateral vessels in the porta hepatis that appear to reconstitute intrahepatic portal vein radicles. Findings are consistent with cavernous transformation. Due to what appears to be chronic thrombosis of the portal vein, the patient is likely not a candidate for TIPS. CT of the abdomen and pelvis with contrast may be of additional benefit in reconfirming portal vein thrombosis and assessing for other venous collateral pathways. 2. Compensatory increased hepatic arterial flow. 3. The hepatic veins are patent.  Large volume of ascites present. 4. The liver shows evidence of advanced cirrhosis. Electronically Signed   By: Aletta Edouard M.D.   On: 06/03/2018 08:17   Ir Paracentesis  Result  Date: 05/31/2018 INDICATION: History of ETOH cirrhosis with recurrent ascites, esophageal varices, GI bleed and encephalopathy. Request for diagnostic and therapeutic paracentesis today. EXAM: ULTRASOUND GUIDED PARACENTESIS MEDICATIONS: 1% lidocaine 10 mL COMPLICATIONS: None immediate. PROCEDURE: Informed written consent was obtained from the patient after a discussion of the risks, benefits and alternatives to treatment. A timeout was performed prior to the initiation of the procedure. Initial ultrasound scanning demonstrates a large amount of ascites within the right lower abdominal quadrant. The right lower abdomen was prepped and draped in the usual sterile fashion. 1% lidocaine with epinephrine was used for local anesthesia. Following this, a 19 gauge, 7-cm, Yueh catheter was introduced. An ultrasound image was saved for documentation purposes. The paracentesis was performed. The catheter was removed and a dressing was applied. The patient tolerated the procedure well without immediate post procedural complication. FINDINGS: A total of approximately 7 L of clear yellow fluid was removed. Samples were sent to the laboratory as requested by the clinical team. IMPRESSION: Successful ultrasound-guided paracentesis yielding 7 liters of peritoneal fluid. Read by: Gareth Eagle, PA-C Electronically Signed   By: Markus Daft M.D.   On: 05/31/2018 14:12   Ir Paracentesis  Result Date: 05/18/2018 INDICATION: Ascites secondary to alcoholic liver disease. Request for therapeutic paracentesis. EXAM: ULTRASOUND GUIDED PARACENTESIS MEDICATIONS: 1% lidocaine 10 mL COMPLICATIONS: None immediate. PROCEDURE: Informed written consent was obtained from the patient after a discussion of the risks, benefits and alternatives to treatment. A timeout was performed prior to the initiation of the procedure. Initial ultrasound scanning demonstrates a large amount of ascites within the right lower abdominal quadrant. The right lower  abdomen was prepped and draped in the usual sterile fashion. 1% lidocaine with epinephrine was used for local anesthesia. Following this, a 19 gauge, 7-cm, Yueh catheter was introduced. An ultrasound image was saved for documentation purposes. The paracentesis was performed. The catheter was removed and a dressing was applied. The patient tolerated the procedure well without immediate post procedural complication. FINDINGS: A total of approximately 6.5 L of clear yellow fluid was removed. IMPRESSION: Successful ultrasound-guided paracentesis yielding 6.5 liters of peritoneal fluid. Read by: Gareth Eagle, PA-C Electronically Signed   By: Markus Daft M.D.   On: 05/18/2018 15:42      TODAY-DAY OF DISCHARGE:  Subjective:   Katie Holland today has no headache,no chest abdominal pain,no new weakness tingling or numbness, feels much better wants to go home today.   Objective:   Blood pressure 117/82, pulse 96, temperature 97.7 F (36.5 C), resp. rate 16, height 5\' 4"  (1.626 m), weight 63 kg, SpO2 100 %.  Intake/Output Summary (Last 24 hours) at 06/06/2018 0947 Last data filed at 06/05/2018 1756 Gross  per 24 hour  Intake 340 ml  Output 700 ml  Net -360 ml   Filed Weights   06/02/18 0520 06/02/18 1125  Weight: 63 kg 63 kg    Exam: Awake Alert, Oriented *3, No new F.N deficits, Normal affect [place].AT,PERRAL Supple Neck,No JVD, No cervical lymphadenopathy appriciated.  Symmetrical Chest wall movement, Good air movement bilaterally, CTAB RRR,No Gallops,Rubs or new Murmurs, No Parasternal Heave +ve B.Sounds, Abd Soft, Non tender, No organomegaly appriciated, No rebound -guarding or rigidity. No Cyanosis, Clubbing or edema, No new Rash or bruise   PERTINENT RADIOLOGIC STUDIES: Dg Chest Port 1 View  Result Date: 06/05/2018 CLINICAL DATA:  Respiratory failure. EXAM: PORTABLE CHEST 1 VIEW COMPARISON:  Radiograph June 04, 2018. FINDINGS: The heart size and mediastinal contours are within normal  limits. Both lungs are clear. No pneumothorax or pleural effusion is noted. The visualized skeletal structures are unremarkable. IMPRESSION: No active disease. Electronically Signed   By: Marijo Conception, M.D.   On: 06/05/2018 09:08   Dg Chest Port 1 View  Result Date: 06/04/2018 CLINICAL DATA:  Respiratory failure EXAM: PORTABLE CHEST 1 VIEW COMPARISON:  None. FINDINGS: Hypoventilation with decreased lung volume and bibasilar atelectasis. Negative for edema or effusion. Heart size normal. IMPRESSION: Hypoventilation with bibasilar atelectasis. Electronically Signed   By: Franchot Gallo M.D.   On: 06/04/2018 07:58   US Liver Doppler  Result Date: 06/03/2018 CLINICAL DATA:  Cirrhosis, portal hypertension, acute variceal bleeding from esophageal varices, ascites and possible need for TIPS procedure. Assessment of liver vasculature and portal vein patency prior to possible TIPS. EXAM: DUPLEX ULTRASOUND OF LIVER TECHNIQUE: Color and duplex Doppler ultrasound was performed to evaluate the hepatic in-flow and out-flow vessels. COMPARISON:  CT of the abdomen without contrast on 04/25/2018 FINDINGS: Portal Vein Velocities The portal vein is completely occluded and contains thrombus. There are some collateral vessels in the porta hepatis that appear to reconstitute some intrahepatic portal vein radicles in both right and left lobes. Findings are consistent with cavernous transformation. Hepatic Vein Velocities Right:  12 cm/sec Middle:  19 cm/sec Left:  30 cm/sec Hepatic Artery Velocity: 196 cm/sec. Hepatic arterial waveform is normal. Elevated velocities are consistent with demonstrated portal vein thrombosis and compensatory increased hepatic arterial flow. Splenic Vein Velocity:  26 cm/sec Varices: No abdominal varices or perisplenic varices visualized by ultrasound. Ascites: Large volume ascites identified in the peritoneal cavity, especially around the liver. The liver to self demonstrates evidence of advanced  cirrhosis and is small, fibrotic and shrunken in appearance with grossly nodular surface contour. No gross hepatic masses identified by ultrasound. The spleen is not enlarged. IMPRESSION: 1. Complete thrombosis of the main portal vein with collateral vessels in the porta hepatis that appear to reconstitute intrahepatic portal vein radicles. Findings are consistent with cavernous transformation. Due to what appears to be chronic thrombosis of the portal vein, the patient is likely not a candidate for TIPS. CT of the abdomen and pelvis with contrast may be of additional benefit in reconfirming portal vein thrombosis and assessing for other venous collateral pathways. 2. Compensatory increased hepatic arterial flow. 3. The hepatic veins are patent.  Large volume of ascites present. 4. The liver shows evidence of advanced cirrhosis. Electronically Signed   By: Aletta Edouard M.D.   On: 06/03/2018 08:17   Ir Paracentesis  Result Date: 05/31/2018 INDICATION: History of ETOH cirrhosis with recurrent ascites, esophageal varices, GI bleed and encephalopathy. Request for diagnostic and therapeutic paracentesis today. EXAM: ULTRASOUND GUIDED PARACENTESIS  MEDICATIONS: 1% lidocaine 10 mL COMPLICATIONS: None immediate. PROCEDURE: Informed written consent was obtained from the patient after a discussion of the risks, benefits and alternatives to treatment. A timeout was performed prior to the initiation of the procedure. Initial ultrasound scanning demonstrates a large amount of ascites within the right lower abdominal quadrant. The right lower abdomen was prepped and draped in the usual sterile fashion. 1% lidocaine with epinephrine was used for local anesthesia. Following this, a 19 gauge, 7-cm, Yueh catheter was introduced. An ultrasound image was saved for documentation purposes. The paracentesis was performed. The catheter was removed and a dressing was applied. The patient tolerated the procedure well without immediate  post procedural complication. FINDINGS: A total of approximately 7 L of clear yellow fluid was removed. Samples were sent to the laboratory as requested by the clinical team. IMPRESSION: Successful ultrasound-guided paracentesis yielding 7 liters of peritoneal fluid. Read by: Gareth Eagle, PA-C Electronically Signed   By: Markus Daft M.D.   On: 05/31/2018 14:12   Ir Paracentesis  Result Date: 05/18/2018 INDICATION: Ascites secondary to alcoholic liver disease. Request for therapeutic paracentesis. EXAM: ULTRASOUND GUIDED PARACENTESIS MEDICATIONS: 1% lidocaine 10 mL COMPLICATIONS: None immediate. PROCEDURE: Informed written consent was obtained from the patient after a discussion of the risks, benefits and alternatives to treatment. A timeout was performed prior to the initiation of the procedure. Initial ultrasound scanning demonstrates a large amount of ascites within the right lower abdominal quadrant. The right lower abdomen was prepped and draped in the usual sterile fashion. 1% lidocaine with epinephrine was used for local anesthesia. Following this, a 19 gauge, 7-cm, Yueh catheter was introduced. An ultrasound image was saved for documentation purposes. The paracentesis was performed. The catheter was removed and a dressing was applied. The patient tolerated the procedure well without immediate post procedural complication. FINDINGS: A total of approximately 6.5 L of clear yellow fluid was removed. IMPRESSION: Successful ultrasound-guided paracentesis yielding 6.5 liters of peritoneal fluid. Read by: Gareth Eagle, PA-C Electronically Signed   By: Markus Daft M.D.   On: 05/18/2018 15:42     PERTINENT LAB RESULTS: CBC: Recent Labs    06/05/18 0727 06/06/18 0523  WBC 9.2 8.2  HGB 10.0* 8.5*  HCT 31.6* 26.7*  PLT 92* 79*   CMET CMP     Component Value Date/Time   NA 140 06/05/2018 0219   K 4.2 06/05/2018 0219   CL 113 (H) 06/05/2018 0219   CO2 20 (L) 06/05/2018 0219   GLUCOSE 151 (H)  06/05/2018 0219   BUN 17 06/05/2018 0219   CREATININE 1.01 (H) 06/05/2018 0219   CALCIUM 8.7 (L) 06/05/2018 0219   PROT 5.6 (L) 06/04/2018 0548   ALBUMIN 3.0 (L) 06/04/2018 0548   AST 40 06/04/2018 0548   ALT 21 06/04/2018 0548   ALKPHOS 80 06/04/2018 0548   BILITOT 4.7 (H) 06/04/2018 0548   GFRNONAA >60 06/05/2018 0219   GFRAA >60 06/05/2018 0219    GFR Estimated Creatinine Clearance: 55.6 mL/min (A) (by C-G formula based on SCr of 1.01 mg/dL (H)). No results for input(s): LIPASE, AMYLASE in the last 72 hours. No results for input(s): CKTOTAL, CKMB, CKMBINDEX, TROPONINI in the last 72 hours. Invalid input(s): POCBNP No results for input(s): DDIMER in the last 72 hours. No results for input(s): HGBA1C in the last 72 hours. No results for input(s): CHOL, HDL, LDLCALC, TRIG, CHOLHDL, LDLDIRECT in the last 72 hours. No results for input(s): TSH, T4TOTAL, T3FREE, THYROIDAB in the last 72 hours.  Invalid input(s): FREET3 No results for input(s): VITAMINB12, FOLATE, FERRITIN, TIBC, IRON, RETICCTPCT in the last 72 hours. Coags: Recent Labs    06/04/18 0319  INR 1.47   Microbiology: Recent Results (from the past 240 hour(s))  MRSA PCR Screening     Status: None   Collection Time: 06/02/18  1:56 PM  Result Value Ref Range Status   MRSA by PCR NEGATIVE NEGATIVE Final    Comment:        The GeneXpert MRSA Assay (FDA approved for NASAL specimens only), is one component of a comprehensive MRSA colonization surveillance program. It is not intended to diagnose MRSA infection nor to guide or monitor treatment for MRSA infections. Performed at [facility], [place] [address], [place], [place] [address]     FURTHER DISCHARGE INSTRUCTIONS:  Get Medicines reviewed and adjusted: Please take all your medications with you for your next visit with your Primary MD  Laboratory/radiological data: Please request your Primary MD to go over all hospital tests and procedure/radiological  results at the follow up, please ask your Primary MD to get all Hospital records sent to his/her office.  In some cases, they will be blood work, cultures and biopsy results pending at the time of your discharge. Please request that your primary care M.D. goes through all the records of your hospital data and follows up on these results.  Also Note the following: If you experience worsening of your admission symptoms, develop shortness of breath, life threatening emergency, suicidal or homicidal thoughts you must seek medical attention immediately by calling 911 or calling your MD immediately  if symptoms less severe.  You must read complete instructions/literature along with all the possible adverse reactions/side effects for all the Medicines you take and that have been prescribed to you. Take any new Medicines after you have completely understood and accpet all the possible adverse reactions/side effects.   Do not drive when taking Pain medications or sleeping medications (Benzodaizepines)  Do not take more than prescribed Pain, Sleep and Anxiety Medications. It is not advisable to combine anxiety,sleep and pain medications without talking with your primary care practitioner  Special Instructions: If you have smoked or chewed Tobacco  in the last 2 yrs please stop smoking, stop any regular Alcohol  and or any Recreational drug use.  Wear Seat belts while driving.  Please note: You were cared for by a hospitalist during your hospital stay. Once you are discharged, your primary care physician will handle any further medical issues. Please note that NO REFILLS for any discharge medications will be authorized once you are discharged, as it is imperative that you return to your primary care physician (or establish a relationship with a primary care physician if you do not have one) for your post hospital discharge needs so that they can reassess your need for medications and monitor your lab  values.  Total Time spent coordinating discharge including counseling, education and face to face time equals 35 minutes.  SignedOren Binet 06/06/2018 9:47 AM

## 2018-06-06 NOTE — Progress Notes (Signed)
Nsg Discharge Note  Admit Date:  06/02/2018 Discharge date: 06/06/2018   Katie Holland to be D/C'd Home per MD order.  AVS teaching completed. Patient/caregiver able to verbalize understanding.   Skin clean, dry and intact without evidence of skin break down, no evidence of skin tears noted. IV catheter discontinued intact. Site without signs and symptoms of complications - no redness or edema noted at insertion site, patient denies c/o pain - only slight tenderness at site.  Dressing with slight pressure applied.  D/c Instructions-Education: Discharge instructions given to patient/family with verbalized understanding. D/c education completed with patient/family including follow up instructions, medication list, d/c activities limitations if indicated, with other d/c instructions as indicated by MD - patient able to verbalize understanding, all questions fully answered. Patient instructed to return to ED, call 911, or call MD for any changes in condition.  Patient escorted via [place], and D/C home via private auto.  Hiram Comber, RN 06/06/2018 2:31 PM

## 2018-06-07 ENCOUNTER — Telehealth: Payer: Self-pay

## 2018-06-07 NOTE — Telephone Encounter (Signed)
Called and Lm for pt to call back to schedule appt with an APP for this week or next - lots of open appts before Christmas.

## 2018-06-07 NOTE — Telephone Encounter (Signed)
-----   Message from Yetta Flock, MD sent at 06/07/2018  4:35 PM EST ----- Geri [place], This patient is due to see me on 06/29/17. She should keep this appointment, but not sure if one of the APPs has an opening in the next week or so before Christmas to see her. She needs close monitoring right now, she's quite ill. Will coordinate follow up EGD at her next visit. Thanks  ----- Message ----- From: Doran Stabler, MD Sent: 06/05/2018   2:22 PM EST To: Yetta Flock, MD  Richardson Landry,  This patient's bleeding stopped and she is likely to go home on 12/15.  Diuretics resumed as spironolactone 100 mg daily and furosemide 40 mg daily.  Not surprisingly, she has fluid retention from liquids administered during hospitalization.  Her abdomen is soft, but I think she is likely need a therapeutic paracentesis about a week after discharge.  She knows to call sooner if she feels becomes necessary more urgently.  She will need close follow-up with you or one of the apps within a couple of weeks, and a repeat upper endoscopy in perhaps a month to obliterate any remaining varices.  I think prophylactic variceal banding is probably her only option at this point since the TIPS is not feasible.  We have tried very hard to stress to her the importance of complete alcohol abstinence.  -HD

## 2018-06-08 ENCOUNTER — Telehealth: Payer: Self-pay | Admitting: Family Medicine

## 2018-06-08 ENCOUNTER — Telehealth: Payer: Self-pay

## 2018-06-08 NOTE — Telephone Encounter (Signed)
Copied from [place] [id number]. Topic: Quick Communication - Home Health Verbal Orders >> Jun 08, 2018 10:11 AM Berneta Levins wrote: Caller/Agency: Jenny Reichmann with [facility] Number: [phone], OK to leave a message Requesting OT/PT/Skilled Nursing/Social Work: social work evaluation Frequency: initial evaluation

## 2018-06-08 NOTE — Telephone Encounter (Signed)
Spoke to [place] with [facility] voiced understanding that dr Volanda Napoleon hav approved the verbal orders for pt as requested

## 2018-06-08 NOTE — Telephone Encounter (Signed)
Unable to reach patient at time of TCM Call.  Left message for patient to return call when available. CRM created.

## 2018-06-08 NOTE — Telephone Encounter (Signed)
ok 

## 2018-06-08 NOTE — Telephone Encounter (Signed)
Ok for the orders 

## 2018-06-09 ENCOUNTER — Telehealth: Payer: Self-pay | Admitting: Family Medicine

## 2018-06-09 NOTE — Telephone Encounter (Signed)
Admit Date: 06/02/2018 Discharge date: 06/06/2018  Recommendations for Outpatient Follow-up:  1. Follow up with PCP in 1-2 weeks 2. Please obtain BMP/CBC in one week 3. Please follow up with gastroenterology-probably requires a second look endoscopy to see if repeat banding is required.  Admitted From:  Home  Disposition: [place]: No  Equipment/Devices: None  Discharge Condition: Stable  CODE STATUS: FULL CODE  Diet recommendation:  Heart Healthy    Transition Care Management Follow-up Telephone Call   Date discharged? 06/06/18   How have you been since you were released from the hospital? "Here and there, up and down" "trying to gain my strength back"   Do you understand why you were in the hospital? yes   Do you understand the discharge instructions? yes   Where were you discharged to? Home   Items Reviewed:  Medications reviewed: yes  Allergies reviewed: yes  Dietary changes reviewed: yes  Referrals reviewed: yes, daughter and Casie with [facility] is working on getting GI appt   Functional Questionnaire:   Activities of Daily Living (ADLs):   She states they are independent in the following: ambulation, bathing and hygiene, feeding, continence, grooming, toileting and dressing States they require assistance with the following: n/a   Any transportation issues/concerns?: no, daughter and son-in-law will be bringing her.   Any patient concerns? no   Confirmed importance and date/time of follow-up visits scheduled yes  Provider Appointment booked with Dr Volanda Napoleon on 06/11/18 at 2pm  Confirmed with patient if condition begins to worsen call PCP or go to the ER.  Patient was given the office number and encouraged to call back with question or concerns.  : yes

## 2018-06-09 NOTE — Telephone Encounter (Signed)
Patient is scheduled to see Ellouise Newer, PA-C on 06/14/18.

## 2018-06-09 NOTE — Telephone Encounter (Signed)
Casie with [facility] called for verbal orders for continuation of nursing care. Needing VO for continuation of care, 1 time per week Also needing VO for social work eval for transportation  Please advise Dr Volanda Napoleon. Thanks.

## 2018-06-09 NOTE — Telephone Encounter (Signed)
Ok for verbal orders ?

## 2018-06-09 NOTE — Telephone Encounter (Signed)
ok 

## 2018-06-10 NOTE — Telephone Encounter (Signed)
Spoke with Katie Holland with [facility] verbalized understanding that pt verbal order request has been approved by dr Volanda Napoleon

## 2018-06-11 ENCOUNTER — Encounter: Payer: Self-pay | Admitting: Family Medicine

## 2018-06-11 ENCOUNTER — Telehealth: Payer: Self-pay

## 2018-06-11 ENCOUNTER — Other Ambulatory Visit: Payer: Self-pay

## 2018-06-11 ENCOUNTER — Ambulatory Visit (INDEPENDENT_AMBULATORY_CARE_PROVIDER_SITE_OTHER): Payer: Medicare Other | Admitting: Family Medicine

## 2018-06-11 VITALS — BP 110/58 | HR 102 | Temp 98.4°F | Wt 151.0 lb

## 2018-06-11 DIAGNOSIS — F17200 Nicotine dependence, unspecified, uncomplicated: Secondary | ICD-10-CM

## 2018-06-11 DIAGNOSIS — F17210 Nicotine dependence, cigarettes, uncomplicated: Secondary | ICD-10-CM | POA: Diagnosis not present

## 2018-06-11 DIAGNOSIS — K7031 Alcoholic cirrhosis of liver with ascites: Secondary | ICD-10-CM | POA: Diagnosis not present

## 2018-06-11 LAB — CBC WITH DIFFERENTIAL/PLATELET
BASOS ABS: 0 10*3/uL (ref 0.0–0.1)
Basophils Relative: 0.2 % (ref 0.0–3.0)
Eosinophils Absolute: 0.1 10*3/uL (ref 0.0–0.7)
Eosinophils Relative: 2.3 % (ref 0.0–5.0)
HCT: 30.2 % — ABNORMAL LOW (ref 36.0–46.0)
Hemoglobin: 10 g/dL — ABNORMAL LOW (ref 12.0–15.0)
Lymphocytes Relative: 18.8 % (ref 12.0–46.0)
Lymphs Abs: 1.1 10*3/uL (ref 0.7–4.0)
MCHC: 33.2 g/dL (ref 30.0–36.0)
MCV: 91.2 fl (ref 78.0–100.0)
MONOS PCT: 17.3 % — AB (ref 3.0–12.0)
Monocytes Absolute: 1 10*3/uL (ref 0.1–1.0)
Neutro Abs: 3.6 10*3/uL (ref 1.4–7.7)
Neutrophils Relative %: 61.4 % (ref 43.0–77.0)
Platelets: 76 10*3/uL — ABNORMAL LOW (ref 150.0–400.0)
RBC: 3.31 Mil/uL — ABNORMAL LOW (ref 3.87–5.11)
RDW: 19.2 % — ABNORMAL HIGH (ref 11.5–15.5)
WBC: 5.9 10*3/uL (ref 4.0–10.5)

## 2018-06-11 LAB — COMPREHENSIVE METABOLIC PANEL
ALT: 22 U/L (ref 0–35)
AST: 38 U/L — ABNORMAL HIGH (ref 0–37)
Albumin: 3.3 g/dL — ABNORMAL LOW (ref 3.5–5.2)
Alkaline Phosphatase: 176 U/L — ABNORMAL HIGH (ref 39–117)
BUN: 14 mg/dL (ref 6–23)
CO2: 27 mEq/L (ref 19–32)
Calcium: 8.9 mg/dL (ref 8.4–10.5)
Chloride: 100 mEq/L (ref 96–112)
Creatinine, Ser: 0.83 mg/dL (ref 0.40–1.20)
GFR: 92.32 mL/min (ref >60.00)
Glucose, Bld: 100 mg/dL — ABNORMAL HIGH (ref 70–99)
Potassium: 4 mEq/L (ref 3.5–5.1)
Sodium: 135 mEq/L (ref 135–145)
Total Bilirubin: 4.6 mg/dL — ABNORMAL HIGH (ref 0.2–1.2)
Total Protein: 5.8 g/dL — ABNORMAL LOW (ref 6.0–8.3)

## 2018-06-11 MED ORDER — ADULT MULTIVITAMIN W/MINERALS CH: 1.0000 | ORAL_TABLET | Freq: Every day | ORAL | 1 refills | Status: DC

## 2018-06-11 MED ORDER — FERROUS SULFATE 325 (65 FE) MG PO TBEC: 325.0000 mg | DELAYED_RELEASE_TABLET | Freq: Two times a day (BID) | ORAL | 3 refills | Status: DC

## 2018-06-11 MED ORDER — PREDNISOLONE 5 MG PO TABS: 40.0000 mg | ORAL_TABLET | Freq: Every day | ORAL | 0 refills | Status: DC

## 2018-06-11 MED ORDER — FOLIC ACID 1 MG PO TABS: 1.0000 mg | ORAL_TABLET | Freq: Every day | ORAL | 1 refills | Status: DC

## 2018-06-11 NOTE — Progress Notes (Signed)
Subjective:    Patient ID: Katie Holland, female    DOB: 09-Jan-1966, 52 y.o.   MRN: [id number]  No chief complaint on file.   HPI Patient was seen today for f/u.  Pt was admitted 12/11-12/15/19 for UGIB 2/2 variceal bleed.  Hospital course complicated by hemorrhagic shock requiring multiple units of pRBCs, FFP, plts and pressure support in ICU.  Patient was transferred to hospitalist service on 12/14.  In hospital it was determined patient was not a candidate for TIPS.  Paracentesis done on 12/15.  Pt to continue lactulose and rifaximin.  Since d/c from hospital pt claims she is not drinking.  Pt's son in law states he can neither confirm nor deny this.  Pt states she is trying to do better.  HH comes to house 2 times per week, however patient states they only check her vitals.  Tobacco dependence: -Patient continues to smoke cigarettes, <1 ppd -Is not ready to quit.  Past Medical History:  Diagnosis Date  . Alcoholic liver disease ([place])   . Chronic back pain    on disability  . Cirrhosis of liver ([place])   . Esophageal varices ([place])   . GERD (gastroesophageal reflux disease)   . Jaundice     Allergies  Allergen Reactions  . Contrast Media [Iodinated Diagnostic Agents] Itching and Swelling  . Latex Itching and Swelling    No breathing impairment, however  . Sulfa Antibiotics Itching and Swelling    No breathing impairment, however    ROS General: Denies fever, chills, night sweats, changes in weight, changes in appetite HEENT: Denies headaches, ear pain, changes in vision, rhinorrhea, sore throat CV: Denies CP, palpitations, SOB, orthopnea Pulm: Denies SOB, cough, wheezing GI: Denies abdominal pain, nausea, vomiting, diarrhea, constipation   +increased abdominal size GU: Denies dysuria, hematuria, frequency, vaginal discharge Msk: Denies muscle cramps, joint pains Neuro: Denies weakness, numbness, tingling Skin: Denies rashes, bruising  + pruritus Psych: Denies depression,  anxiety, hallucinations    Objective:    Blood pressure (!) 110/58, pulse (!) 102, temperature 98.4 F (36.9 C), temperature source Oral, weight 151 lb (68.5 kg), SpO2 96 %.  Gen. Pleasant, well-nourished, in no distress, normal affect  HEENT: [place]/AT, face symmetric, mild scleral icterus, PERRLA, EOMI, nares patent without drainage Lungs: no accessory muscle use, CTAB, no wheezes or rales Cardiovascular: RRR, no m/r/g, no peripheral edema Abdomen: BS present, soft, NT, distended, serous drainage noted at RLQ site of paracentesis. Area dressed with Tefla pad and paper tape. Musculoskeletal: No deformities, no cyanosis or clubbing, normal tone Neuro:  A&Ox3, CN II-XII intact, normal gait Skin:  Warm, no lesions/ rash   Wt Readings from Last 3 Encounters:  06/11/18 151 lb (68.5 kg)  06/02/18 138 lb 14.2 oz (63 kg)  05/27/18 140 lb 2 oz (63.6 kg)    Lab Results  Component Value Date   WBC 8.2 06/06/2018   HGB 8.5 (L) 06/06/2018   HCT 26.7 (L) 06/06/2018   PLT 79 (L) 06/06/2018   GLUCOSE 151 (H) 06/05/2018   ALT 21 06/04/2018   AST 40 06/04/2018   NA 140 06/05/2018   K 4.2 06/05/2018   CL 113 (H) 06/05/2018   CREATININE 1.01 (H) 06/05/2018   BUN 17 06/05/2018   CO2 20 (L) 06/05/2018   INR 1.47 06/04/2018    Assessment/Plan:  Alcoholic cirrhosis of liver with ascites ([place])  -Discussed importance of refraining from drinking any alcohol. -Patient to continue current medications including lactulose 3 times daily, rifaximin  mean, spironolactone, and multivitamins -Discussed importance of keeping follow-up appointments with gastroenterology - Plan: CBC with Differential/Platelet, Comprehensive metabolic panel, ferrous sulfate 325 (65 FE) MG EC tablet, folic acid (FOLVITE) 1 MG tablet, DISCONTINUED: Multiple Vitamin (MULTIVITAMIN WITH MINERALS) TABS tablet, DISCONTINUED: prednisoLONE 5 MG TABS tablet -Given handouts -We will attempt to contact H/H agency in regards to medication  assistance   Tobacco dependence -Smoking cessation counseling greater than 3 minutes, less than 10 minutes -Patient has no desire to quit at this time -We will readdress at each OFV  Follow-up in the next 1 to 2 months  Grier Mitts, MD

## 2018-06-11 NOTE — Telephone Encounter (Signed)
Pt was seen this afternoon at our clinic, requested to have a refill on the prednisolone Rx, called pt left a message per Dr Volanda Napoleon that the Rx was for a short term course and that she should check with her GI to see if she needs to stay on the Rx long term.

## 2018-06-11 NOTE — Patient Instructions (Signed)
Ascites  Ascites is a collection of too much fluid in the abdomen. Ascites can range from mild to severe. If ascites is not treated, it can get worse. What are the causes? This condition may be caused by:  A liver condition called cirrhosis. This is the most common cause of ascites.  Long-term (chronic) or alcoholic hepatitis.  Infection or inflammation in the abdomen.  Cancer in the abdomen.  Heart failure.  Kidney disease.  Inflammation of the pancreas.  Clots in the veins of the liver. What are the signs or symptoms? Symptoms of this condition include:  A feeling of fullness in the abdomen. This is common.  An increase in the size of the abdomen or waist.  Swelling in the legs.  Swelling of the scrotum (in men).  Difficulty breathing.  Pain in the abdomen.  Sudden weight gain. If the condition is mild, you may not have symptoms. How is this diagnosed? This condition is diagnosed based on your medical history and a physical exam. Your health care provider may order imaging tests, such as an ultrasound or CT scan of your abdomen. How is this treated? Treatment for this condition depends on the cause of the ascites. It may include:  Taking a pill to make you urinate. This is called a water pill (diuretic pill).  Strictly reducing your salt (sodium) intake. Salt can cause extra fluid to be kept (retained) in the body, and this makes ascites worse.  Having a procedure to remove fluid from your abdomen (paracentesis).  Having a procedure that connects two of the major veins within your liver and relieves pressure on your liver. This is called a TIPS procedure (transjugular intrahepatic portosystemic shunt procedure).  Placement of a drainage catheter (peritoneovenous shunt) to manage the extra fluid in the abdomen. Ascites may go away or improve when the condition that caused it is treated. Follow these instructions at home:  Keep track of your weight. To do this,  weigh yourself at the same time every day and write down your weight.  Keep track of how much you drink and any changes in how much or how often you urinate.  Follow any instructions that your health care provider gives you about how much to drink.  Try not to eat salty (high-sodium) foods.  Take over-the-counter and prescription medicines only as told by your health care provider.  Keep all follow-up visits as told by your health care provider. This is important.  Report any changes in your health to your health care provider, especially if you develop new symptoms or your symptoms get worse. Contact a health care provider if:  You gain more than 3 lb (1.36 kg) in 3 days.  Your waist size increases.  You have new swelling in your legs.  The swelling in your legs gets worse. Get help right away if:  You have a fever.  You are confused.  You have new or worsening breathing trouble.  You have new or worsening pain in your abdomen.  You have new or worsening swelling in the scrotum (in men). Summary  Ascites is a collection of too much fluid in the abdomen.  Ascites may be caused by various conditions, such as cirrhosis, hepatitis, cancer, or congestive heart failure.  Symptoms may include swelling of the abdomen and other areas due to extra fluid in the body.  Treatments may involve dietary changes, medicines, or procedures. This information is not intended to replace advice given to you by your health care   provider. Make sure you discuss any questions you have with your health care provider. Document Released: 06/09/2005 Document Revised: 02/19/2017 Document Reviewed: 02/19/2017 Elsevier Interactive Patient Education  2019 [employer]  Alcoholic Liver Disease Alcoholic liver disease refers to liver damage that is caused by drinking a lot of alcohol over a long period of time. The liver is an organ that:  Helps to divide (break down) and absorb fats and other  nutrients in the blood.  Helps to remove harmful substances from the blood.  Makes the parts of the blood that help to form clots and prevent excessive bleeding. Damage may cause the liver to stop working properly. There are three main types of alcoholic liver disease, and they usually occur in the following order. 1. Fatty liver disease. This is considered the early stage of alcoholic liver disease. It is a condition in which too much fat has built up in the liver cells. In many cases, fatty liver disease does not cause any symptoms. It is often diagnosed when lab tests are done for other reasons. 2. Alcoholic hepatitis. This is liver inflammation that decreases the liver's ability to function normally. 3. Alcoholic cirrhosis. Cirrhosis is long-term (chronic) liver injury. Having cirrhosis means that many healthy liver cells have been replaced by scar tissue. This prevents blood from flowing through the liver, which makes it difficult for the liver to function. Symptoms of this condition usually get worse (progress) over time if alcohol use continues. Treatment requires stopping all alcohol intake. What are the causes? Alcoholic liver disease is caused by long-term (chronic) heavy alcohol use. The liver filters alcohol out of the bloodstream. When alcohol gets broken down in the liver, it releases poisonous (toxic) chemicals that damage liver cells. What increases the risk? This condition is more likely to develop in:  Women.  People who have a family history of alcoholic liver disease.  People who have poor nutrition.  People who are obese.  People who drink a lot of alcohol, especially people who often drink a lot during short periods of time (binge drink).  People who have an underlying liver disease such as hepatitis B or hepatitis C.  People who lack certain nutrients, such as folate or thiamine (have nutrient deficiencies). What are the signs or symptoms? Most people do not have  symptoms in the early stages of this disease. If early symptoms do develop, they may include:  Loss of appetite.  Nausea and vomiting. Symptoms of moderate disease include:  Loss of appetite.  Nausea and vomiting.  Diarrhea.  Weight loss without trying.  Fatigue.  Yellowing of the skin and the whites of the eyes (jaundice).  Pain and swelling in the abdomen.  Fever. Symptoms of advanced disease include:  Weight loss and muscle loss.  Itchy skin.  Buildup of fluid in the abdomen (ascites).  Swelling of the feet and ankles (edema).  Nosebleeds or bleeding gums.  Bloody bowel movements.  Enlarged fingertips (clubbing).  Trouble concentrating.  Trouble sleeping.  Mood changes.  Agitation.  Confusion. Some people do not have symptoms until the condition becomes severe. Symptoms often get worse right after a period of heavy drinking. How is this diagnosed? This condition may be diagnosed based on:  A physical exam.  Blood tests.  Tests that create detailed images of the body. These may include: ? Liver ultrasound. ? CT scan. ? MRI.  A liver biopsy. For this test, a small sample of liver tissue is removed and checked for signs of  damage. How is this treated? The most important part of treatment is to stop drinking alcohol. If you are addicted to alcohol, your health care provider will help you make a plan to quit. This plan may involve:  Taking medicine to decrease unpleasant symptoms that are caused by stopping or decreasing alcohol use (withdrawal symptoms).  Entering a treatment program to help you stop drinking.  Joining a support group. Treatment for alcoholic liver disease may also include:  Nutritional therapy. Your health care provider or a diet and nutrition specialist (dietitian) may recommend: ? Eating a healthy diet. ? Taking vitamins. ? Eating foods that contain a lot of zinc or B vitamins such as folate, thiamine, or  pyridoxine.  Steroid medicines to reduce liver inflammation. These may be recommended if your disease is severe.  Receiving a donated liver (liver transplant). This is only done in very severe cases, and only for people who have completely stopped drinking and can commit to never drinking alcohol again. Follow these instructions at home:   Do not drink alcohol. Follow your treatment plan, and work with your health care provider as needed.  Consider joining an alcohol support group. These groups can provide emotional support and guidance.  Take over-the-counter and prescription medicines only as told by your health care provider. These include vitamins and supplements.  Do not use medicines or eat foods that contain alcohol unless told by your health care provider.  Follow instructions from your health care provider or dietitian about eating a healthy diet.  Keep all follow-up visits as told by your health care provider. This is important. Contact a health care provider if:  You develop a fever.  Your skin color becomes more yellow, pale, or dark.  You develop headaches. Get help right away if:  You vomit blood.  You have bright red blood in your stool.  You have black, tarry stools.  You have trouble: ? Thinking. ? Walking. ? Balancing. ? Breathing. Summary  Alcoholic liver disease refers to liver damage that is caused by drinking a lot of alcohol over a long period of time.  Symptoms of this condition usually get worse (progress) over time if alcohol use continues.  Your health care provider will do a physical exam and tests to diagnose your condition.  The most important part of treatment is to stop drinking alcohol. Follow your treatment plan, and work with your health care provider as needed. This information is not intended to replace advice given to you by your health care provider. Make sure you discuss any questions you have with your health care  provider. Document Released: 06/30/2014 Document Revised: 02/19/2017 Document Reviewed: 02/19/2017 Elsevier Interactive Patient Education  2019 [employer]  Esophageal Varices  Esophageal varices are enlarged veins in the part of the body that moves food from the mouth to the stomach (esophagus). They develop when extra blood is forced to flow through these veins because the blood's normal pathway is blocked. Without treatment, esophageal varices eventually break and bleed (hemorrhage), which can be life-threatening. What are the causes? This condition may be caused by:  Scarring of the liver (cirrhosis) due to alcoholism. This is the most common cause.  Long-term (chronic) liver disease.  Severe heart failure.  A blood clot in a vein that supplies the liver (portal vein).  A disease that causes inflammation in the organs and other body areas (sarcoidosis).  A parasitic infection that can cause liver damage (schistosomiasis). What are the signs or symptoms? Esophageal varices  usually do not cause symptoms unless they start to bleed. Symptoms of bleeding esophageal varices include:  Vomiting material that is bright red or that is black and looks like coffee grounds.  Coughing up blood.  Stools (feces) that look black and tarry.  Dizziness or light-headedness.  Low blood pressure.  Loss of consciousness. How is this diagnosed? This condition is diagnosed with a procedure called endoscopy. During endoscopy, your health care provider uses a flexible tube with a small camera on the end of it (endoscope) to look down your throat and examine your esophagus. You may also have other tests, including:  Imaging tests such as a CT scan or ultrasound.  Blood tests. How is this treated? This condition may be treated with medicines or procedures that reduce pressure in the varices and reduce the risk of bleeding. Medicines are usually used for varices that are not bleeding. Procedures  that may be done for bleeding varices include:  Placing an elastic band around the varices to keep them from bleeding (variceal ligation).  Replacing blood that you have lost due to bleeding. This may include getting a transfusion of blood or parts of blood, such as platelets or clotting factors.  You may be given antibiotic medicine to help prevent infection.  Getting an injection that causes the varices to shrink and close (sclerotherapy). You may also be given medicines that tighten (constrict) blood vessels or change blood flow.  Placing a tube into your esophagus and then passing a balloon through the tube and inflating the balloon (balloon tamponade). The balloon applies pressure to the bleeding veins to help stop the bleeding.  Placing a small tube within the veins in the liver (transjugular intrahepatic portosystemic shunt, TIPS). This decreases blood flow and pressure in the esophageal varices. If other treatments do not work, you may need a liver transplant. Follow these instructions at home:  Take over-the-counter and prescription medicines only as told by your health care provider.  If you were prescribed an antibiotic medicine, take it as told by your health care provider. Do not stop taking the antibiotic even if you start to feel better.  Do not take any NSAIDs (such as aspirin or ibuprofen) before first getting approval from your health care provider.  Do not drink alcohol.  Return to your normal activities as told by your health care provider. Ask your health care provider what activities are safe for you.  Keep all follow-up visits as told by your health care provider. This is important. Contact a health care provider if:  You have abdominal pain.  You are unable to eat or drink. Get help right away if:  You have blood in your stool or vomit.  You have stools that look black or tarry.  You have chest pain.  You feel dizzy or have low blood pressure.  You  lose consciousness. These symptoms may represent a serious problem that is an emergency. Do not wait to see if the symptoms will go away. Get medical help right away. Call your local emergency services (911 in the [place] Do not drive yourself to the hospital. Summary  Esophageal varices are enlarged veins in the esophagus, the part of your body that moves food from your mouth to your stomach.  Without treatment, esophageal varices eventually break and bleed (hemorrhage), which can be life-threatening.  Esophageal varices usually do not cause symptoms unless they start to bleed.  Keep all follow-up visits as told by your health care provider. This is important.  This information is not intended to replace advice given to you by your health care provider. Make sure you discuss any questions you have with your health care provider. Document Released: 08/30/2003 Document Revised: 03/11/2017 Document Reviewed: 03/11/2017 Elsevier Interactive Patient Education  2019 [employer].

## 2018-06-14 ENCOUNTER — Other Ambulatory Visit (INDEPENDENT_AMBULATORY_CARE_PROVIDER_SITE_OTHER): Payer: Medicare Other

## 2018-06-14 ENCOUNTER — Ambulatory Visit (INDEPENDENT_AMBULATORY_CARE_PROVIDER_SITE_OTHER): Payer: Medicare Other | Admitting: Physician Assistant

## 2018-06-14 ENCOUNTER — Encounter: Payer: Self-pay | Admitting: Physician Assistant

## 2018-06-14 VITALS — BP 106/62 | HR 106 | Ht 64.0 in | Wt 154.1 lb

## 2018-06-14 DIAGNOSIS — K7460 Unspecified cirrhosis of liver: Secondary | ICD-10-CM

## 2018-06-14 DIAGNOSIS — K7030 Alcoholic cirrhosis of liver without ascites: Secondary | ICD-10-CM

## 2018-06-14 DIAGNOSIS — K7290 Hepatic failure, unspecified without coma: Secondary | ICD-10-CM | POA: Diagnosis not present

## 2018-06-14 DIAGNOSIS — K7031 Alcoholic cirrhosis of liver with ascites: Secondary | ICD-10-CM | POA: Diagnosis not present

## 2018-06-14 DIAGNOSIS — I8500 Esophageal varices without bleeding: Secondary | ICD-10-CM

## 2018-06-14 LAB — COMPREHENSIVE METABOLIC PANEL
ALBUMIN: 3.1 g/dL — AB (ref 3.5–5.2)
ALT: 17 U/L (ref 0–35)
AST: 33 U/L (ref 0–37)
Alkaline Phosphatase: 144 U/L — ABNORMAL HIGH (ref 39–117)
BUN: 10 mg/dL (ref 6–23)
CO2: 23 mEq/L (ref 19–32)
Calcium: 8.8 mg/dL (ref 8.4–10.5)
Chloride: 103 mEq/L (ref 96–112)
Creatinine, Ser: 0.8 mg/dL (ref 0.40–1.20)
GFR: 96.33 mL/min (ref >60.00)
Glucose, Bld: 92 mg/dL (ref 70–99)
Potassium: 3.6 mEq/L (ref 3.5–5.1)
SODIUM: 134 meq/L — AB (ref 135–145)
Total Bilirubin: 4.1 mg/dL — ABNORMAL HIGH (ref 0.2–1.2)
Total Protein: 5.7 g/dL — ABNORMAL LOW (ref 6.0–8.3)

## 2018-06-14 LAB — CBC WITH DIFFERENTIAL/PLATELET
Basophils Absolute: 0 10*3/uL (ref 0.0–0.1)
Basophils Relative: 0.5 % (ref 0.0–3.0)
Eosinophils Absolute: 0.1 10*3/uL (ref 0.0–0.7)
Eosinophils Relative: 1.9 % (ref 0.0–5.0)
HCT: 28.8 % — ABNORMAL LOW (ref 36.0–46.0)
Hemoglobin: 9.6 g/dL — ABNORMAL LOW (ref 12.0–15.0)
Lymphocytes Relative: 26.2 % (ref 12.0–46.0)
Lymphs Abs: 1.6 10*3/uL (ref 0.7–4.0)
MCHC: 33.3 g/dL (ref 30.0–36.0)
MCV: 90 fl (ref 78.0–100.0)
Monocytes Absolute: 0.8 10*3/uL (ref 0.1–1.0)
Monocytes Relative: 14.1 % — ABNORMAL HIGH (ref 3.0–12.0)
Neutro Abs: 3.4 10*3/uL (ref 1.4–7.7)
Neutrophils Relative %: 57.3 % (ref 43.0–77.0)
Platelets: 81 10*3/uL — ABNORMAL LOW (ref 150.0–400.0)
RBC: 3.2 Mil/uL — ABNORMAL LOW (ref 3.87–5.11)
RDW: 19.7 % — ABNORMAL HIGH (ref 11.5–15.5)
WBC: 6 10*3/uL (ref 4.0–10.5)

## 2018-06-14 LAB — PROTIME-INR
INR: 1.6 ratio — ABNORMAL HIGH (ref 0.8–1.0)
Prothrombin Time: 18.7 s — ABNORMAL HIGH (ref 9.6–13.1)

## 2018-06-14 LAB — AMMONIA: Ammonia: 62 umol/L — ABNORMAL HIGH (ref 11–35)

## 2018-06-14 NOTE — Progress Notes (Signed)
Chief Complaint: Decompensated alcoholic cirrhosis with ascites and esophageal varices  HPI:    Katie Holland is a 52 year old African-American female with a past medical history as listed below including decompensated alcoholic cirrhosis with ascites and esophageal varices, who presents to clinic today for follow-up after recent hospitalization for variceal bleed.    05/27/2018 office visit with Nicoletta Ba, PA-C.  At that time, she was following up after recent hospitalization with abdominal distention and hematemesis.  She met criteria for alcoholic hepatitis and was on a 28-day course of Prednisolone.  It was documented she had 3 large volume paracentesis in the past month with rapid reaccumulation of ascites despite diuretics.  Also had subacute variceal bleed with large varices noted recent EGD x2 with multiple bands placed and portal gastropathy.  Patient started on 2 g sodium diet, discussion needed follow-up EGD with Dr. Havery Moros.    06/02/2018 patient admitted to the hospital for cirrhosis and hematemesis.  MELD at that time 23, CPT score 11.  Was discussed 7 bands had been placed 04/25/2018 and additional bands placed 04/27/2018.  A recent CT scan had showed nodular appearing omentum, Ca1 25 is elevated although this was thought to be from cirrhosis with a small volume of ascites negative for malignancy.  AFP normal.  Started on empiric antibiotics.  Also repeat EGD.    EGD 06/02/2018 with bleeding grade 2 esophageal varices completely eradicated.  Banded.  Red blood in the fundus and body of the stomach.    06/05/2018 patient discharged.  She had a MELD of 17.  As discussed she was not a candidate for TIPS given her MELD and portal vein thrombosis.  IR would consider if MELD improved.  Continued on Lactulose and Xifaxan.  Also PPI twice daily.  It was discussed she would need a repeat EGD in several weeks to band any remaining varices.    Today, the patient presents clinic and explains that she  has had severe abdominal distention since being discharged from the hospital.  She feels "pregnant" and this is very uncomfortable for her as she is trying to get around.  Also bilateral swelling in her legs and feet.  Also complains of a headache today but tells me she has not been taking anything for it because someone told her not to take Tylenol.  Currently using Lasix 60 mg daily, Lactulose 45 mL's 3 times a day, Pantoprazole 40 mg twice daily, Potassium chloride 10 mEq daily, Rifaximin 550 mg twice daily and Spironolactone 150 mg daily.    Currently denies any further drinking since discharge.    Denies fever, chills, weight loss, anorexia, nausea, vomiting, heartburn, reflux or symptoms that awaken her from sleep.  Past Medical History:  Diagnosis Date  . Alcoholic liver disease ([place])   . Chronic back pain    on disability  . Cirrhosis of liver ([place])   . Esophageal varices ([place])   . GERD (gastroesophageal reflux disease)   . Jaundice     Past Surgical History:  Procedure Laterality Date  . BACK SURGERY    . ESOPHAGEAL BANDING  04/25/2018   Procedure: ESOPHAGEAL BANDING;  Surgeon: Yetta Flock, MD;  Location: [facility] ENDOSCOPY;  Service: Gastroenterology;;  . ESOPHAGEAL BANDING  04/27/2018   Procedure: ESOPHAGEAL BANDING;  Surgeon: Milus Banister, MD;  Location: [facility] ENDOSCOPY;  Service: Endoscopy;;  . ESOPHAGEAL BANDING  06/02/2018   Procedure: ESOPHAGEAL BANDING;  Surgeon: Doran Stabler, MD;  Location: [place];  Service: Gastroenterology;;  .  ESOPHAGOGASTRODUODENOSCOPY N/A 04/27/2018   Procedure: ESOPHAGOGASTRODUODENOSCOPY (EGD);  Surgeon: Milus Banister, MD;  Location: [facility] ENDOSCOPY;  Service: Endoscopy;  Laterality: N/A;  . ESOPHAGOGASTRODUODENOSCOPY (EGD) WITH PROPOFOL N/A 04/25/2018   Procedure: ESOPHAGOGASTRODUODENOSCOPY (EGD) WITH PROPOFOL;  Surgeon: Yetta Flock, MD;  Location: [place];  Service: Gastroenterology;  Laterality: N/A;  .  ESOPHAGOGASTRODUODENOSCOPY (EGD) WITH PROPOFOL N/A 06/02/2018   Procedure: ESOPHAGOGASTRODUODENOSCOPY (EGD) WITH PROPOFOL;  Surgeon: Doran Stabler, MD;  Location: [place];  Service: Gastroenterology;  Laterality: N/A;  . IR PARACENTESIS  04/26/2018  . IR PARACENTESIS  05/18/2018  . IR PARACENTESIS  05/31/2018    Current Outpatient Medications  Medication Sig Dispense Refill  . ferrous sulfate 325 (65 FE) MG EC tablet Take 1 tablet (325 mg total) by mouth 2 (two) times daily. 295 tablet 3  . folic acid (FOLVITE) 1 MG tablet Take 1 tablet (1 mg total) by mouth daily. 90 tablet 1  . furosemide (LASIX) 20 MG tablet Take 3 tablets (60 mg total) by mouth daily. 90 tablet 1  . Lactulose 20 GM/30ML SOLN Take 45 mLs (30 g total) by mouth 3 (three) times daily. Take 30 mls every eight hours. Titrate for 3 bm per 24 hrs 1892 mL 6  . Multiple Vitamin (MULTIVITAMIN WITH MINERALS) TABS tablet Take 1 tablet by mouth daily. 90 tablet 1  . potassium chloride (K-DUR) 10 MEQ tablet Take 1 tablet (10 mEq total) by mouth daily. (Patient taking differently: Take 5 mEq by mouth daily. ) 30 tablet 0  . rifaximin (XIFAXAN) 550 MG TABS tablet Take 1 tablet (550 mg total) by mouth 2 (two) times daily. 60 tablet 11  . spironolactone (ALDACTONE) 100 MG tablet Take 1.5 tablets (150 mg total) by mouth daily. 45 tablet 1  . thiamine 100 MG tablet Take 1 tablet (100 mg total) by mouth daily. 30 tablet 0  . pantoprazole (PROTONIX) 40 MG tablet Take 1 tablet (40 mg total) by mouth 2 (two) times daily. (Patient not taking: Reported on 06/14/2018) 30 tablet 3   No current facility-administered medications for this visit.     Allergies as of 06/14/2018 - Review Complete 06/14/2018  Allergen Reaction Noted  . Contrast media [iodinated diagnostic agents] Itching and Swelling 10/26/2017  . Latex Itching and Swelling 10/26/2017  . Sulfa antibiotics Itching and Swelling 10/26/2017    Family History  Problem Relation Age  of Onset  . Colon cancer Neg Hx   . Esophageal cancer Neg Hx     Social History   Socioeconomic History  . Marital status: Single    Spouse name: Not on file  . Number of children: Not on file  . Years of education: Not on file  . Highest education level: Not on file  Occupational History  . Occupation: disability  Social Needs  . Financial resource strain: Not on file  . Food insecurity:    Worry: Not on file    Inability: Not on file  . Transportation needs:    Medical: Not on file    Non-medical: Not on file  Tobacco Use  . Smoking status: Current Every Day Smoker    Packs/day: 0.75    Years: 20.00    Pack years: 15.00  . Smokeless tobacco: Never Used  Substance and Sexual Activity  . Alcohol use: Not Currently    Comment: denies h/o withdrawal  . Drug use: Not Currently  . Sexual activity: Not on file  Lifestyle  . Physical activity:  Days per week: Not on file    Minutes per session: Not on file  . Stress: Not on file  Relationships  . Social connections:    Talks on phone: Not on file    Gets together: Not on file    Attends religious service: Not on file    Active member of club or organization: Not on file    Attends meetings of clubs or organizations: Not on file    Relationship status: Not on file  . Intimate partner violence:    Fear of current or ex partner: Not on file    Emotionally abused: Not on file    Physically abused: Not on file    Forced sexual activity: Not on file  Other Topics Concern  . Not on file  Social History Narrative  . Not on file    Review of Systems:    Constitutional: No weight loss, fever or chills Cardiovascular: No chest pain   Respiratory: No SOB  Gastrointestinal: See HPI and otherwise negative   Physical Exam:  Vital signs: BP 106/62   Pulse (!) 106   Ht _0  (1.626 m)   Wt 154 lb 2 oz (69.9 kg)   BMI 26.46 kg/m   Constitutional:   Pleasant ill appearing AA female appears to be in NAD, Well  developed, Well nourished, alert and cooperative Respiratory: Respirations even and unlabored. Lungs clear to auscultation bilaterally.   No wheezes, crackles, or rhonchi.  Cardiovascular: Normal S1, S2. No MRG. Regular rate and rhythm. +b/l edema to level of knee Gastrointestinal:Tight, Marked distension, mild generalized ttp. No rebound or guarding. Normal bowel sounds. No appreciable masses or hepatomegaly. Rectal:  Not performed.  Psychiatric: Demonstrates good judgement and reason without abnormal affect or behaviors.  MOST RECENT LABS AND IMAGING: CBC    Component Value Date/Time   WBC 5.9 06/11/2018 1456   RBC 3.31 (L) 06/11/2018 1456   HGB 10.0 (L) 06/11/2018 1456   HCT 30.2 (L) 06/11/2018 1456   PLT 76.0 (L) 06/11/2018 1456   MCV 91.2 06/11/2018 1456   MCH 29.3 06/06/2018 0523   MCHC 33.2 06/11/2018 1456   RDW 19.2 (H) 06/11/2018 1456   LYMPHSABS 1.1 06/11/2018 1456   MONOABS 1.0 06/11/2018 1456   EOSABS 0.1 06/11/2018 1456   BASOSABS 0.0 06/11/2018 1456    CMP     Component Value Date/Time   NA 135 06/11/2018 1456   K 4.0 06/11/2018 1456   CL 100 06/11/2018 1456   CO2 27 06/11/2018 1456   GLUCOSE 100 (H) 06/11/2018 1456   BUN 14 06/11/2018 1456   CREATININE 0.83 06/11/2018 1456   CALCIUM 8.9 06/11/2018 1456   PROT 5.8 (L) 06/11/2018 1456   ALBUMIN 3.3 (L) 06/11/2018 1456   AST 38 (H) 06/11/2018 1456   ALT 22 06/11/2018 1456   ALKPHOS 176 (H) 06/11/2018 1456   BILITOT 4.6 (H) 06/11/2018 1456   GFRNONAA >60 06/05/2018 0219   GFRAA >60 06/05/2018 0219    Assessment: 1.  Decompensated alcoholic cirrhosis: With varices and refractory ascites, presents to clinic today with marked abdominal distention and bilateral peripheral edema  Plan: 1.  Scheduled patient for an ultrasound-guided paracentesis.  Ordered 7 g of albumin for every liter over 5 L taken. 2.  Sent cytology for labs including cell count and differential, culture and Gram stain 3.  Patient continue  current medications. 4.  Repeated labs CBC, CMP, PT/INR and Ammonia 5.  Patient will need a repeat EGD  with possible banding with Dr. Havery Moros in the hospital.  He did not have any available appointments today.  Will discuss with him for scheduling in Jan/Feb.  She will also need follow-up in our clinic after that or possibly prior, pending on symptoms. 6.  Discussed with patient she can try over-the-counter Ibuprofen every 8 hours for her headache. 7.  Patient continue on a low-sodium diet, less than 2 g/day.  Also discussed complete alcohol abstinence.  Also recommend she do daily weights to ensure she is not gaining too much fluid. 8.  Patient to follow in clinic per recommendations from Dr. Havery Moros.  Katie Newer, PA-C [facility] 06/14/2018, 9:33 AM  Cc: Billie Ruddy, MD

## 2018-06-14 NOTE — Patient Instructions (Signed)
If you are age 53 or older, your body mass index should be between 23-30. Your Body mass index is 26.46 kg/m. If this is out of the aforementioned range listed, please consider follow up with your Primary Care Provider.  If you are age 80 or younger, your body mass index should be between 19-25. Your Body mass index is 26.46 kg/m. If this is out of the aformentioned range listed, please consider follow up with your Primary Care Provider.   Your provider has requested that you go to the basement level for lab work before leaving today. Press "B" on the elevator. The lab is located at the first door on the left as you exit the elevator.  You have been scheduled for US guided paracentesis on 06/15/18 at 9 am at [facility]. Please arrive 15 minutes early.  Continue diuretics, Xifaxan, and Lactulose as prescribed.  Can try over-the-counter Ibuprofen every 8 hours as needed for headache.  We will contact you regarding EGD with banding at hospital after talking with Dr. Havery Moros.  Follow up with me  Thank you for choosing me and [facility].    Ellouise Newer, PA-C

## 2018-06-15 ENCOUNTER — Ambulatory Visit (HOSPITAL_COMMUNITY)
Admission: RE | Admit: 2018-06-15 | Discharge: 2018-06-15 | Disposition: A | Discharge status: Home / Self Care | Payer: Medicare Other | Source: Ambulatory Visit | Attending: Physician Assistant | Admitting: Physician Assistant

## 2018-06-15 DIAGNOSIS — K7031 Alcoholic cirrhosis of liver with ascites: Secondary | ICD-10-CM | POA: Diagnosis not present

## 2018-06-15 DIAGNOSIS — K7290 Hepatic failure, unspecified without coma: Secondary | ICD-10-CM | POA: Diagnosis not present

## 2018-06-15 DIAGNOSIS — K7460 Unspecified cirrhosis of liver: Secondary | ICD-10-CM

## 2018-06-15 DIAGNOSIS — R188 Other ascites: Secondary | ICD-10-CM | POA: Diagnosis not present

## 2018-06-15 LAB — BODY FLUID CELL COUNT WITH DIFFERENTIAL
Eos, Fluid: 0 %
Lymphs, Fluid: 8 %
Monocyte-Macrophage-Serous Fluid: 84 % (ref 50–90)
Neutrophil Count, Fluid: 8 % (ref 0–25)
Total Nucleated Cell Count, Fluid: 74 cu mm (ref 0–1000)

## 2018-06-15 MED ORDER — ALBUMIN HUMAN 25 % IV SOLN
12.5000 g | Freq: Once | INTRAVENOUS | Status: AC
  Administered 2018-06-15: 12.5 g via INTRAVENOUS
  Filled 2018-06-15 (×2): qty 50

## 2018-06-15 MED ORDER — LIDOCAINE HCL 1 % IJ SOLN
INTRAMUSCULAR | Status: AC
  Filled 2018-06-15: qty 10

## 2018-06-15 MED ORDER — SODIUM CHLORIDE 0.9 % IV SOLN
INTRAVENOUS | Status: AC
  Administered 2018-06-15: 11:00:00 via INTRAVENOUS

## 2018-06-15 NOTE — Discharge Instructions (Signed)
Albumin injection °What is this medicine? °ALBUMIN (al BYOO min) is used to treat or prevent shock following serious injury, bleeding, surgery, or burns by increasing the volume of blood plasma. This medicine can also replace low blood protein. °This medicine may be used for other purposes; ask your health care provider or pharmacist if you have questions. °COMMON BRAND NAME(S): Albuked, Albumarc, Albuminar, Albuminex, AlbuRx, Albutein, Buminate, Flexbumin, Kedbumin, Macrotec, Plasbumin °What should I tell my health care provider before I take this medicine? °They need to know if you have any of the following conditions: °-anemia °-heart disease °-kidney disease °-an unusual or allergic reaction to albumin, other medicines, foods, dyes, or preservatives °-pregnant or trying to get pregnant °-breast-feeding °How should I use this medicine? °This medicine is for infusion into a vein. It is given by a health-care professional in a hospital or clinic. °Talk to your pediatrician regarding the use of this medicine in children. While this drug may be prescribed for selected conditions, precautions do apply. °Overdosage: If you think you have taken too much of this medicine contact a poison control center or emergency room at once. °NOTE: This medicine is only for you. Do not share this medicine with others. °What if I miss a dose? °This does not apply. °What may interact with this medicine? °Interactions are not expected. °This list may not describe all possible interactions. Give your health care provider a list of all the medicines, herbs, non-prescription drugs, or dietary supplements you use. Also tell them if you smoke, drink alcohol, or use illegal drugs. Some items may interact with your medicine. °What should I watch for while using this medicine? °Your condition will be closely monitored while you receive this medicine. °Some products are derived from human plasma, and there is a small risk that these products may  contain certain types of virus or bacteria. All products are processed to kill most viruses and bacteria. If you have questions concerning the risk of infections, discuss them with your doctor or health care professional. °What side effects may I notice from receiving this medicine? °Side effects that you should report to your doctor or health care professional as soon as possible: °-allergic reactions like skin rash, itching or hives, swelling of the face, lips, or tongue °-breathing problems °-changes in heartbeat °-fever, chills °-pain, redness or swelling at the injection site °-signs of viral infection including fever, drowsiness, chills, runny nose followed in about 2 weeks by a rash and joint pain °-tightness in the chest °Side effects that usually do not require medical attention (report to your doctor or health care professional if they continue or are bothersome): °-increased salivation °-nausea, vomiting °This list may not describe all possible side effects. Call your doctor for medical advice about side effects. You may report side effects to FDA at 1-800-FDA-1088. °Where should I keep my medicine? °This does not apply. You will not be given this medicine to store at home. °NOTE: This sheet is a summary. It may not cover all possible information. If you have questions about this medicine, talk to your doctor, pharmacist, or health care provider. °© 2019 Elsevier/Gold Standard (2007-09-02 10:18:55) ° °

## 2018-06-15 NOTE — Procedures (Signed)
PROCEDURE SUMMARY:  Successful image-guided paracentesis from the right lower abdomen.  Yielded 7.0 liters of clear yellow fluid.  No immediate complications.  EBL: zero Patient tolerated well.   Specimen was sent for labs. Patient to receive post procedure albumin per ordering provider.  Please see imaging section of Epic for full dictation.  Joaquim Nam PA-C 06/15/2018 10:03 AM

## 2018-06-17 ENCOUNTER — Telehealth: Payer: Self-pay | Admitting: Family Medicine

## 2018-06-17 NOTE — Telephone Encounter (Signed)
Copied from [place] [id number]. Topic: Quick Communication - See Telephone Encounter >> Jun 17, 2018 10:41 AM Sheran Luz wrote: CRM for notification. See Telephone encounter for: 06/17/18.  Kasey with [facility] calling to inform patients PCP, Dr. Volanda Napoleon, that patient is unavailable for home health visit this week. They will resume services next week.

## 2018-06-18 LAB — BODY FLUID CULTURE: Culture: NO GROWTH

## 2018-06-22 NOTE — Telephone Encounter (Signed)
Called Katie Holland with [facility] left a message with an ok to resume services for  Home health visit this week.

## 2018-06-24 ENCOUNTER — Telehealth: Payer: Self-pay

## 2018-06-24 DIAGNOSIS — K7290 Hepatic failure, unspecified without coma: Secondary | ICD-10-CM

## 2018-06-24 DIAGNOSIS — K7460 Unspecified cirrhosis of liver: Secondary | ICD-10-CM

## 2018-06-24 NOTE — Telephone Encounter (Signed)
-----   Message from Levin Erp, [place] sent at 06/24/2018  8:55 AM EST ----- Regarding: See Dr. Ozella Rocks addendum. Please call patient and make sure she is aware of things dr Havery Moros pointed out. Thanks-JLL ----- Message ----- From: Yetta Flock, MD Sent: 06/24/2018   8:10 AM EST To: Levin Erp, PA    ----- Message ----- From: Levin Erp, [place] Sent: 06/14/2018   9:50 AM EST To: Yetta Flock, MD

## 2018-06-24 NOTE — Telephone Encounter (Signed)
Left message for patient to call back  

## 2018-06-24 NOTE — Telephone Encounter (Signed)
See alternate note  

## 2018-06-24 NOTE — Telephone Encounter (Signed)
Agree with the note as outlined with the following changes: - patient should not be on any NSAIDs with her liver disease and recent bleeding. Please let her know this. For headaches she should use low dose tylenol (<2gm / day is safe in cirrhosis, pending she is no longer drinking) - she is quick sick with high MELD, not yet a transplant candidate given her alcohol use recently. She unfortunately is not a good TIPS candidate given portal vein thrombosis and high MELD - she will need close monitoring with repeat banding of the varices in the near future. I will look at the hospital endo schedule with our staff and coordinate a banding soon - labs stable since discharge - critically important she stop all alcohol to minimize risk of disease progression and if she is going to be considered for transplant in the future

## 2018-06-24 NOTE — Progress Notes (Signed)
I was able to get a spot at the hospital for EGD with banding on 07/07/17 at [facility]. Hopefully the patient can make this appointment, our staff will contact her about it.

## 2018-06-24 NOTE — Telephone Encounter (Signed)
-----   Message from Yetta Flock, MD sent at 06/24/2018  8:25 AM EST ----- Regarding: hospital EGD Sheri, I was able to get a spot at the hospital for 07/07/17 at [facility] for EGD with banding of varices for this patient. Can you contact her and see if she can do this, and give her instructions?  She should get a CBC, INR, and CMET done within a few days of the exam, if you can let her know to go to the lab for that. Thanks

## 2018-06-24 NOTE — Progress Notes (Signed)
Agree with the note as outlined with the following changes: - patient should not be on any NSAIDs with her liver disease and recent bleeding. Please let her know this. For headaches she should use low dose tylenol (<2gm / day is safe in cirrhosis, pending she is no longer drinking) - she is quick sick with high MELD, not yet a transplant candidate given her alcohol use recently. She unfortunately is not a good TIPS candidate given portal vein thrombosis and high MELD - she will need close monitoring with repeat banding of the varices in the near future. I will look at the hospital endo schedule with our staff and coordinate a banding soon - labs stable since discharge - critically important she stop all alcohol to minimize risk of disease progression and if she is going to be considered for transplant in the future

## 2018-06-24 NOTE — Telephone Encounter (Signed)
Left message on machine to call back  

## 2018-06-25 NOTE — Telephone Encounter (Signed)
Pt's daughter [name] returned your call. Pls call her at her cell [phone] or at her work [phone].

## 2018-06-25 NOTE — Telephone Encounter (Signed)
Left message for patient to call back  

## 2018-06-29 ENCOUNTER — Other Ambulatory Visit: Payer: Self-pay

## 2018-06-29 ENCOUNTER — Ambulatory Visit (INDEPENDENT_AMBULATORY_CARE_PROVIDER_SITE_OTHER): Payer: Medicare Other | Admitting: Gastroenterology

## 2018-06-29 ENCOUNTER — Encounter: Payer: Self-pay | Admitting: Gastroenterology

## 2018-06-29 VITALS — BP 130/80 | HR 92 | Ht 64.0 in | Wt 149.0 lb

## 2018-06-29 DIAGNOSIS — I8500 Esophageal varices without bleeding: Secondary | ICD-10-CM

## 2018-06-29 DIAGNOSIS — K7290 Hepatic failure, unspecified without coma: Secondary | ICD-10-CM

## 2018-06-29 DIAGNOSIS — K7460 Unspecified cirrhosis of liver: Secondary | ICD-10-CM

## 2018-06-29 DIAGNOSIS — R971 Elevated cancer antigen 125 [CA 125]: Secondary | ICD-10-CM | POA: Diagnosis not present

## 2018-06-29 DIAGNOSIS — Z23 Encounter for immunization: Secondary | ICD-10-CM | POA: Diagnosis not present

## 2018-06-29 DIAGNOSIS — K7031 Alcoholic cirrhosis of liver with ascites: Secondary | ICD-10-CM | POA: Diagnosis not present

## 2018-06-29 DIAGNOSIS — K7682 Hepatic encephalopathy: Secondary | ICD-10-CM

## 2018-06-29 MED ORDER — FUROSEMIDE 20 MG PO TABS: 40.0000 mg | ORAL_TABLET | Freq: Two times a day (BID) | ORAL | 1 refills | Status: DC

## 2018-06-29 MED ORDER — SPIRONOLACTONE 100 MG PO TABS: 200.0000 mg | ORAL_TABLET | Freq: Every day | ORAL | 1 refills | Status: DC

## 2018-06-29 NOTE — Addendum Note (Signed)
Addended by: Marlon Pel on: 06/29/2018 09:52 AM   Modules accepted: Orders

## 2018-06-29 NOTE — H&P (View-Only) (Signed)
HPI :  53 year old female here for follow-up visit. She has a history of decompensated alcoholic cirrhosis complicated by ascites, recurrent bleeding from esophageal varices, and hepatic encephalopathy. I initially met her in early November when she was admitted for variceal bleed. She required 2 EGDs for banding of varices at that time. She was unfortunately admitted about a month later for recurrent variceal bleeding, again requiring banding of varices. She was evaluated by IR for tips however given her high meld score and portal vein thrombosis she was not considered a good candidate for TIPS. Given her refractory ascites she has not been on a nonselective beta blocker.  She is here for a follow-up visit today. She is accompanied by her children. Daughter endorses that she continues to drink beer and has not been abstaining from alcohol. She endorses drinking about a 6 pack per week, although unclear how much she is consuming routinely. She is taking 150 mg of Aldactone a day and Lasix 60 mg a day for her ascites. She continues to require periodic large volume paracentesis, last was done on December 24. This is her main complaint today. She otherwise has been compliant with lactulose and rifaximin and patient and children feel her encephalopathy is well controlled. Of note she had a questionable nodular-appearing omentum with an elevated CA-125 level. Ascitic cytology 2 has been negative. She had a CT scan which did not show any problems in the pelvis her ovaries. She has no evidence of HCC.  Family is very concerned about her recurrent ascites and recurrent bleeding. The patient states she has been compliant with a low sodium diet. Of note she is not immune to hepatitis A and has not received a vaccine for that yet. She has not been referred to hepatology yet given her ongoing alcohol use.  She is scheduled for EGD 07/07/17   Past Medical History:  Diagnosis Date  . Alcoholic liver disease ([place])    . Chronic back pain    on disability  . Cirrhosis of liver ([place])   . Esophageal varices ([place])   . GERD (gastroesophageal reflux disease)   . Jaundice      Past Surgical History:  Procedure Laterality Date  . BACK SURGERY    . ESOPHAGEAL BANDING  04/25/2018   Procedure: ESOPHAGEAL BANDING;  Surgeon: Yetta Flock, MD;  Location: [facility] ENDOSCOPY;  Service: Gastroenterology;;  . ESOPHAGEAL BANDING  04/27/2018   Procedure: ESOPHAGEAL BANDING;  Surgeon: Milus Banister, MD;  Location: [facility] ENDOSCOPY;  Service: Endoscopy;;  . ESOPHAGEAL BANDING  06/02/2018   Procedure: ESOPHAGEAL BANDING;  Surgeon: Doran Stabler, MD;  Location: [facility] ENDOSCOPY;  Service: Gastroenterology;;  . ESOPHAGOGASTRODUODENOSCOPY N/A 04/27/2018   Procedure: ESOPHAGOGASTRODUODENOSCOPY (EGD);  Surgeon: Milus Banister, MD;  Location: [facility] ENDOSCOPY;  Service: Endoscopy;  Laterality: N/A;  . ESOPHAGOGASTRODUODENOSCOPY (EGD) WITH PROPOFOL N/A 04/25/2018   Procedure: ESOPHAGOGASTRODUODENOSCOPY (EGD) WITH PROPOFOL;  Surgeon: Yetta Flock, MD;  Location: [place];  Service: Gastroenterology;  Laterality: N/A;  . ESOPHAGOGASTRODUODENOSCOPY (EGD) WITH PROPOFOL N/A 06/02/2018   Procedure: ESOPHAGOGASTRODUODENOSCOPY (EGD) WITH PROPOFOL;  Surgeon: Doran Stabler, MD;  Location: [place];  Service: Gastroenterology;  Laterality: N/A;  . IR PARACENTESIS  04/26/2018  . IR PARACENTESIS  05/18/2018  . IR PARACENTESIS  05/31/2018   Family History  Problem Relation Age of Onset  . Colon cancer Neg Hx   . Esophageal cancer Neg Hx    Social History   Tobacco Use  . Smoking status:  Current Every Day Smoker    Packs/day: 0.75    Years: 20.00    Pack years: 15.00    Types: Cigarettes  . Smokeless tobacco: Never Used  Substance Use Topics  . Alcohol use: Not Currently    Comment: denies h/o withdrawal  . Drug use: Not Currently   Current Outpatient Medications  Medication Sig Dispense Refill  . ferrous  sulfate 325 (65 FE) MG EC tablet Take 1 tablet (325 mg total) by mouth 2 (two) times daily. 166 tablet 3  . folic acid (FOLVITE) 1 MG tablet Take 1 tablet (1 mg total) by mouth daily. 90 tablet 1  . Lactulose 20 GM/30ML SOLN Take 45 mLs (30 g total) by mouth 3 (three) times daily. Take 30 mls every eight hours. Titrate for 3 bm per 24 hrs 1892 mL 6  . Multiple Vitamin (MULTIVITAMIN WITH MINERALS) TABS tablet Take 1 tablet by mouth daily. 90 tablet 1  . pantoprazole (PROTONIX) 40 MG tablet Take 1 tablet (40 mg total) by mouth 2 (two) times daily. 30 tablet 3  . potassium chloride (K-DUR) 10 MEQ tablet Take 1 tablet (10 mEq total) by mouth daily. (Patient taking differently: Take 5 mEq by mouth daily. ) 30 tablet 0  . rifaximin (XIFAXAN) 550 MG TABS tablet Take 1 tablet (550 mg total) by mouth 2 (two) times daily. 60 tablet 11  . thiamine 100 MG tablet Take 1 tablet (100 mg total) by mouth daily. 30 tablet 0  . furosemide (LASIX) 20 MG tablet Take 3 tablets (60 mg total) by mouth daily. 90 tablet 1  . spironolactone (ALDACTONE) 100 MG tablet Take 1.5 tablets (150 mg total) by mouth daily. 45 tablet 1   No current facility-administered medications for this visit.    Allergies  Allergen Reactions  . Contrast Media [Iodinated Diagnostic Agents] Itching and Swelling  . Latex Itching and Swelling    No breathing impairment, however  . Sulfa Antibiotics Itching and Swelling    No breathing impairment, however     Review of Systems: All systems reviewed and negative except where noted in HPI.    US Paracentesis  Result Date: 06/15/2018 INDICATION: Patient with history of ETOH cirrhosis with recurrent ascites requiring repeated large volume paracenteses. Request to IR for diagnostic and therapeutic paracentesis today with post procedure albumin administration if >5L removed. EXAM: ULTRASOUND GUIDED DIAGNOSTIC AND THERAPEUTIC PARACENTESIS MEDICATIONS: 10 mL 1% lidocaine. COMPLICATIONS: None  immediate. PROCEDURE: Informed written consent was obtained from the patient after a discussion of the risks, benefits and alternatives to treatment. A timeout was performed prior to the initiation of the procedure. Initial ultrasound scanning demonstrates a large amount of ascites within the right lower abdominal quadrant. The right lower abdomen was prepped and draped in the usual sterile fashion. 1% lidocaine was used for local anesthesia. Following this, a 19 gauge, 7-cm, Yueh catheter was introduced. An ultrasound image was saved for documentation purposes. The paracentesis was performed. The catheter was removed and a dressing was applied. The patient tolerated the procedure well without immediate post procedural complication. FINDINGS: A total of approximately 7.0 L of clear yellow fluid was removed. Samples were sent to the laboratory as requested by the clinical team. IMPRESSION: Successful ultrasound-guided paracentesis yielding 7.0 liters of peritoneal fluid. Read by Candiss Norse, PA-C Electronically Signed   By: Sandi Mariscal M.D.   On: 06/15/2018 10:12   US Paracentesis  Result Date: 06/06/2018 INDICATION: History of alcoholic cirrhosis now with recurrent symptomatic ascites.  Please perform ultrasound-guided paracentesis for therapeutic purposes with max paracentesis of volume of 4 L. EXAM: ULTRASOUND-GUIDED PARACENTESIS COMPARISON:  Multiple previous ultrasound-guided paracenteses, most recently on 05/31/2018 yielding 7 L MEDICATIONS: None. COMPLICATIONS: None immediate. TECHNIQUE: Informed written consent was obtained from the patient after a discussion of the risks, benefits and alternatives to treatment. A timeout was performed prior to the initiation of the procedure. Initial ultrasound scanning demonstrates a large amount of ascites within the right lower abdominal quadrant. The right lower abdomen was prepped and draped in the usual sterile fashion. 1% lidocaine with epinephrine was used  for local anesthesia. Under direct ultrasound guidance, a 19 gauge, 7-cm, Yueh catheter was introduced. An ultrasound image was saved for documentation purposed. The paracentesis was performed. The catheter was removed and a dressing was applied. The patient tolerated the procedure well without immediate post procedural complication. FINDINGS: A total of approximately 4 liters of serous fluid was removed. IMPRESSION: Successful ultrasound-guided paracentesis yielding 4 liters of peritoneal fluid. Electronically Signed   By: Sandi Mariscal M.D.   On: 06/06/2018 10:54   Dg Chest Port 1 View  Result Date: 06/05/2018 CLINICAL DATA:  Respiratory failure. EXAM: PORTABLE CHEST 1 VIEW COMPARISON:  Radiograph June 04, 2018. FINDINGS: The heart size and mediastinal contours are within normal limits. Both lungs are clear. No pneumothorax or pleural effusion is noted. The visualized skeletal structures are unremarkable. IMPRESSION: No active disease. Electronically Signed   By: Marijo Conception, M.D.   On: 06/05/2018 09:08   Dg Chest Port 1 View  Result Date: 06/04/2018 CLINICAL DATA:  Respiratory failure EXAM: PORTABLE CHEST 1 VIEW COMPARISON:  None. FINDINGS: Hypoventilation with decreased lung volume and bibasilar atelectasis. Negative for edema or effusion. Heart size normal. IMPRESSION: Hypoventilation with bibasilar atelectasis. Electronically Signed   By: Franchot Gallo M.D.   On: 06/04/2018 07:58   US Liver Doppler  Result Date: 06/03/2018 CLINICAL DATA:  Cirrhosis, portal hypertension, acute variceal bleeding from esophageal varices, ascites and possible need for TIPS procedure. Assessment of liver vasculature and portal vein patency prior to possible TIPS. EXAM: DUPLEX ULTRASOUND OF LIVER TECHNIQUE: Color and duplex Doppler ultrasound was performed to evaluate the hepatic in-flow and out-flow vessels. COMPARISON:  CT of the abdomen without contrast on 04/25/2018 FINDINGS: Portal Vein Velocities The  portal vein is completely occluded and contains thrombus. There are some collateral vessels in the porta hepatis that appear to reconstitute some intrahepatic portal vein radicles in both right and left lobes. Findings are consistent with cavernous transformation. Hepatic Vein Velocities Right:  12 cm/sec Middle:  19 cm/sec Left:  30 cm/sec Hepatic Artery Velocity: 196 cm/sec. Hepatic arterial waveform is normal. Elevated velocities are consistent with demonstrated portal vein thrombosis and compensatory increased hepatic arterial flow. Splenic Vein Velocity:  26 cm/sec Varices: No abdominal varices or perisplenic varices visualized by ultrasound. Ascites: Large volume ascites identified in the peritoneal cavity, especially around the liver. The liver to self demonstrates evidence of advanced cirrhosis and is small, fibrotic and shrunken in appearance with grossly nodular surface contour. No gross hepatic masses identified by ultrasound. The spleen is not enlarged. IMPRESSION: 1. Complete thrombosis of the main portal vein with collateral vessels in the porta hepatis that appear to reconstitute intrahepatic portal vein radicles. Findings are consistent with cavernous transformation. Due to what appears to be chronic thrombosis of the portal vein, the patient is likely not a candidate for TIPS. CT of the abdomen and pelvis with contrast may be of additional  benefit in reconfirming portal vein thrombosis and assessing for other venous collateral pathways. 2. Compensatory increased hepatic arterial flow. 3. The hepatic veins are patent.  Large volume of ascites present. 4. The liver shows evidence of advanced cirrhosis. Electronically Signed   By: Aletta Edouard M.D.   On: 06/03/2018 08:17   Ir Paracentesis  Result Date: 05/31/2018 INDICATION: History of ETOH cirrhosis with recurrent ascites, esophageal varices, GI bleed and encephalopathy. Request for diagnostic and therapeutic paracentesis today. EXAM:  ULTRASOUND GUIDED PARACENTESIS MEDICATIONS: 1% lidocaine 10 mL COMPLICATIONS: None immediate. PROCEDURE: Informed written consent was obtained from the patient after a discussion of the risks, benefits and alternatives to treatment. A timeout was performed prior to the initiation of the procedure. Initial ultrasound scanning demonstrates a large amount of ascites within the right lower abdominal quadrant. The right lower abdomen was prepped and draped in the usual sterile fashion. 1% lidocaine with epinephrine was used for local anesthesia. Following this, a 19 gauge, 7-cm, Yueh catheter was introduced. An ultrasound image was saved for documentation purposes. The paracentesis was performed. The catheter was removed and a dressing was applied. The patient tolerated the procedure well without immediate post procedural complication. FINDINGS: A total of approximately 7 L of clear yellow fluid was removed. Samples were sent to the laboratory as requested by the clinical team. IMPRESSION: Successful ultrasound-guided paracentesis yielding 7 liters of peritoneal fluid. Read by: Gareth Eagle, PA-C Electronically Signed   By: Markus Daft M.D.   On: 05/31/2018 14:12    Lab Results  Component Value Date   WBC 6.0 06/14/2018   HGB 9.6 (L) 06/14/2018   HCT 28.8 (L) 06/14/2018   MCV 90.0 06/14/2018   PLT 81.0 (L) 06/14/2018    Lab Results  Component Value Date   CREATININE 0.80 06/14/2018   BUN 10 06/14/2018   NA 134 (L) 06/14/2018   K 3.6 06/14/2018   CL 103 06/14/2018   CO2 23 06/14/2018    Lab Results  Component Value Date   ALT 17 06/14/2018   AST 33 06/14/2018   ALKPHOS 144 (H) 06/14/2018   BILITOT 4.1 (H) 06/14/2018    Lab Results  Component Value Date   INR 1.6 (H) 06/14/2018   INR 1.47 06/04/2018   INR 1.64 06/02/2018     Physical Exam: BP 130/80   Pulse 92   Ht '5\' 4"'$  (1.626 m)   Wt 149 lb (67.6 kg)   BMI 25.58 kg/m  Constitutional: Pleasant,, female in no acute  distress. HEENT: Normocephalic and atraumatic. Conjunctivae are normal.(+) scleral icterus. Neck supple.  Cardiovascular: Normal rate, regular rhythm.  Pulmonary/chest: Effort normal and breath sounds normal. No wheezing, rales or rhonchi. Abdominal: distended with ascites, , nontender. . There are no masses palpable.  Extremities: (+) 1-2 LE edema Lymphadenopathy: No cervical adenopathy noted. Neurological: Alert and oriented to person place and time. No asterixis Skin: Skin is warm and dry. No rashes noted. Psychiatric: Normal mood and affect. Behavior is normal.   ASSESSMENT AND PLAN: 53 year old female here for reassessment of the following issues:  Alcoholic cirrhosis with ascites Esophageal varices Hepatic encephalopathy Elevated CA 125  Overall the patient is decompensated, meld of 17. She continues to drink beer as above. I had a lengthy discussion with the patient and her family about her progressive decompensation, risk for mortality with worsening. She absolutely needs to abstain from alcohol in order to minimize further progression. Further I counseled her she is not a candidate for liver transplant  she does not abstain from alcohol. When she starts to abstain I will refer her to hepatology for transplant evaluation. Otherwise she is not a candidate for TIPS for her esophageal varices or her ascites given her high meld score and portal vein thrombosis. We are left managing her varices with banding as well as managing her ascites with diuresis and frequent paracentesis. She does not appear to have [place]. Her elevated CA 125 could be coming from her cirrhosis and ascites, no evidence of malignancy is on cytology from ascitic fluid and prior imaging did not show any problems with the ovaries, however will send for a formal pelvic ultrasound to ensure okay.   Recommendations as below: - COMPLETE ABSTINENCE FROM ALCOHOL - low Na diet - increase lasix to '40mg'$  BID and aldactone to '200mg'$   daily - continue lactulose and Rifaximin - repeat labs on Friday (ensure platelets and INR okay prior to banding), recheck renal function on higher doses of diuretics - will send labs to ensure no other cause of chronic liver diseases - hepatitis A vaccine today, advised patient to get flu shot as well - paracentesis - large volume - albumin if > 5 L - EGD at the hospital on 1/15 for further banding of varices. Holding on propranolol given refractory ascites - follow up in clinic in 6-8 weeks  [place] Cellar, MD [facility] Gastroenterology

## 2018-06-29 NOTE — H&P (View-Only) (Signed)
HPI :  53 year old female here for follow-up visit. She has a history of decompensated alcoholic cirrhosis complicated by ascites, recurrent bleeding from esophageal varices, and hepatic encephalopathy. I initially met her in early November when she was admitted for variceal bleed. She required 2 EGDs for banding of varices at that time. She was unfortunately admitted about a month later for recurrent variceal bleeding, again requiring banding of varices. She was evaluated by IR for tips however given her high meld score and portal vein thrombosis she was not considered a good candidate for TIPS. Given her refractory ascites she has not been on a nonselective beta blocker.  She is here for a follow-up visit today. She is accompanied by her children. Daughter endorses that she continues to drink beer and has not been abstaining from alcohol. She endorses drinking about a 6 pack per week, although unclear how much she is consuming routinely. She is taking 150 mg of Aldactone a day and Lasix 60 mg a day for her ascites. She continues to require periodic large volume paracentesis, last was done on December 24. This is her main complaint today. She otherwise has been compliant with lactulose and rifaximin and patient and children feel her encephalopathy is well controlled. Of note she had a questionable nodular-appearing omentum with an elevated CA-125 level. Ascitic cytology 2 has been negative. She had a CT scan which did not show any problems in the pelvis her ovaries. She has no evidence of HCC.  Family is very concerned about her recurrent ascites and recurrent bleeding. The patient states she has been compliant with a low sodium diet. Of note she is not immune to hepatitis A and has not received a vaccine for that yet. She has not been referred to hepatology yet given her ongoing alcohol use.  She is scheduled for EGD 07/07/17   Past Medical History:  Diagnosis Date  . Alcoholic liver disease ([place])    . Chronic back pain    on disability  . Cirrhosis of liver ([place])   . Esophageal varices ([place])   . GERD (gastroesophageal reflux disease)   . Jaundice      Past Surgical History:  Procedure Laterality Date  . BACK SURGERY    . ESOPHAGEAL BANDING  04/25/2018   Procedure: ESOPHAGEAL BANDING;  Surgeon: Yetta Flock, MD;  Location: [facility] ENDOSCOPY;  Service: Gastroenterology;;  . ESOPHAGEAL BANDING  04/27/2018   Procedure: ESOPHAGEAL BANDING;  Surgeon: Milus Banister, MD;  Location: [facility] ENDOSCOPY;  Service: Endoscopy;;  . ESOPHAGEAL BANDING  06/02/2018   Procedure: ESOPHAGEAL BANDING;  Surgeon: Doran Stabler, MD;  Location: [facility] ENDOSCOPY;  Service: Gastroenterology;;  . ESOPHAGOGASTRODUODENOSCOPY N/A 04/27/2018   Procedure: ESOPHAGOGASTRODUODENOSCOPY (EGD);  Surgeon: Milus Banister, MD;  Location: [facility] ENDOSCOPY;  Service: Endoscopy;  Laterality: N/A;  . ESOPHAGOGASTRODUODENOSCOPY (EGD) WITH PROPOFOL N/A 04/25/2018   Procedure: ESOPHAGOGASTRODUODENOSCOPY (EGD) WITH PROPOFOL;  Surgeon: Yetta Flock, MD;  Location: [place];  Service: Gastroenterology;  Laterality: N/A;  . ESOPHAGOGASTRODUODENOSCOPY (EGD) WITH PROPOFOL N/A 06/02/2018   Procedure: ESOPHAGOGASTRODUODENOSCOPY (EGD) WITH PROPOFOL;  Surgeon: Doran Stabler, MD;  Location: [place];  Service: Gastroenterology;  Laterality: N/A;  . IR PARACENTESIS  04/26/2018  . IR PARACENTESIS  05/18/2018  . IR PARACENTESIS  05/31/2018   Family History  Problem Relation Age of Onset  . Colon cancer Neg Hx   . Esophageal cancer Neg Hx    Social History   Tobacco Use  . Smoking status:  Current Every Day Smoker    Packs/day: 0.75    Years: 20.00    Pack years: 15.00    Types: Cigarettes  . Smokeless tobacco: Never Used  Substance Use Topics  . Alcohol use: Not Currently    Comment: denies h/o withdrawal  . Drug use: Not Currently   Current Outpatient Medications  Medication Sig Dispense Refill  . ferrous  sulfate 325 (65 FE) MG EC tablet Take 1 tablet (325 mg total) by mouth 2 (two) times daily. 824 tablet 3  . folic acid (FOLVITE) 1 MG tablet Take 1 tablet (1 mg total) by mouth daily. 90 tablet 1  . Lactulose 20 GM/30ML SOLN Take 45 mLs (30 g total) by mouth 3 (three) times daily. Take 30 mls every eight hours. Titrate for 3 bm per 24 hrs 1892 mL 6  . Multiple Vitamin (MULTIVITAMIN WITH MINERALS) TABS tablet Take 1 tablet by mouth daily. 90 tablet 1  . pantoprazole (PROTONIX) 40 MG tablet Take 1 tablet (40 mg total) by mouth 2 (two) times daily. 30 tablet 3  . potassium chloride (K-DUR) 10 MEQ tablet Take 1 tablet (10 mEq total) by mouth daily. (Patient taking differently: Take 5 mEq by mouth daily. ) 30 tablet 0  . rifaximin (XIFAXAN) 550 MG TABS tablet Take 1 tablet (550 mg total) by mouth 2 (two) times daily. 60 tablet 11  . thiamine 100 MG tablet Take 1 tablet (100 mg total) by mouth daily. 30 tablet 0  . furosemide (LASIX) 20 MG tablet Take 3 tablets (60 mg total) by mouth daily. 90 tablet 1  . spironolactone (ALDACTONE) 100 MG tablet Take 1.5 tablets (150 mg total) by mouth daily. 45 tablet 1   No current facility-administered medications for this visit.    Allergies  Allergen Reactions  . Contrast Media [Iodinated Diagnostic Agents] Itching and Swelling  . Latex Itching and Swelling    No breathing impairment, however  . Sulfa Antibiotics Itching and Swelling    No breathing impairment, however     Review of Systems: All systems reviewed and negative except where noted in HPI.    US Paracentesis  Result Date: 06/15/2018 INDICATION: Patient with history of ETOH cirrhosis with recurrent ascites requiring repeated large volume paracenteses. Request to IR for diagnostic and therapeutic paracentesis today with post procedure albumin administration if >5L removed. EXAM: ULTRASOUND GUIDED DIAGNOSTIC AND THERAPEUTIC PARACENTESIS MEDICATIONS: 10 mL 1% lidocaine. COMPLICATIONS: None  immediate. PROCEDURE: Informed written consent was obtained from the patient after a discussion of the risks, benefits and alternatives to treatment. A timeout was performed prior to the initiation of the procedure. Initial ultrasound scanning demonstrates a large amount of ascites within the right lower abdominal quadrant. The right lower abdomen was prepped and draped in the usual sterile fashion. 1% lidocaine was used for local anesthesia. Following this, a 19 gauge, 7-cm, Yueh catheter was introduced. An ultrasound image was saved for documentation purposes. The paracentesis was performed. The catheter was removed and a dressing was applied. The patient tolerated the procedure well without immediate post procedural complication. FINDINGS: A total of approximately 7.0 L of clear yellow fluid was removed. Samples were sent to the laboratory as requested by the clinical team. IMPRESSION: Successful ultrasound-guided paracentesis yielding 7.0 liters of peritoneal fluid. Read by Candiss Norse, PA-C Electronically Signed   By: Sandi Mariscal M.D.   On: 06/15/2018 10:12   US Paracentesis  Result Date: 06/06/2018 INDICATION: History of alcoholic cirrhosis now with recurrent symptomatic ascites.  Please perform ultrasound-guided paracentesis for therapeutic purposes with max paracentesis of volume of 4 L. EXAM: ULTRASOUND-GUIDED PARACENTESIS COMPARISON:  Multiple previous ultrasound-guided paracenteses, most recently on 05/31/2018 yielding 7 L MEDICATIONS: None. COMPLICATIONS: None immediate. TECHNIQUE: Informed written consent was obtained from the patient after a discussion of the risks, benefits and alternatives to treatment. A timeout was performed prior to the initiation of the procedure. Initial ultrasound scanning demonstrates a large amount of ascites within the right lower abdominal quadrant. The right lower abdomen was prepped and draped in the usual sterile fashion. 1% lidocaine with epinephrine was used  for local anesthesia. Under direct ultrasound guidance, a 19 gauge, 7-cm, Yueh catheter was introduced. An ultrasound image was saved for documentation purposed. The paracentesis was performed. The catheter was removed and a dressing was applied. The patient tolerated the procedure well without immediate post procedural complication. FINDINGS: A total of approximately 4 liters of serous fluid was removed. IMPRESSION: Successful ultrasound-guided paracentesis yielding 4 liters of peritoneal fluid. Electronically Signed   By: Sandi Mariscal M.D.   On: 06/06/2018 10:54   Dg Chest Port 1 View  Result Date: 06/05/2018 CLINICAL DATA:  Respiratory failure. EXAM: PORTABLE CHEST 1 VIEW COMPARISON:  Radiograph June 04, 2018. FINDINGS: The heart size and mediastinal contours are within normal limits. Both lungs are clear. No pneumothorax or pleural effusion is noted. The visualized skeletal structures are unremarkable. IMPRESSION: No active disease. Electronically Signed   By: Marijo Conception, M.D.   On: 06/05/2018 09:08   Dg Chest Port 1 View  Result Date: 06/04/2018 CLINICAL DATA:  Respiratory failure EXAM: PORTABLE CHEST 1 VIEW COMPARISON:  None. FINDINGS: Hypoventilation with decreased lung volume and bibasilar atelectasis. Negative for edema or effusion. Heart size normal. IMPRESSION: Hypoventilation with bibasilar atelectasis. Electronically Signed   By: Franchot Gallo M.D.   On: 06/04/2018 07:58   US Liver Doppler  Result Date: 06/03/2018 CLINICAL DATA:  Cirrhosis, portal hypertension, acute variceal bleeding from esophageal varices, ascites and possible need for TIPS procedure. Assessment of liver vasculature and portal vein patency prior to possible TIPS. EXAM: DUPLEX ULTRASOUND OF LIVER TECHNIQUE: Color and duplex Doppler ultrasound was performed to evaluate the hepatic in-flow and out-flow vessels. COMPARISON:  CT of the abdomen without contrast on 04/25/2018 FINDINGS: Portal Vein Velocities The  portal vein is completely occluded and contains thrombus. There are some collateral vessels in the porta hepatis that appear to reconstitute some intrahepatic portal vein radicles in both right and left lobes. Findings are consistent with cavernous transformation. Hepatic Vein Velocities Right:  12 cm/sec Middle:  19 cm/sec Left:  30 cm/sec Hepatic Artery Velocity: 196 cm/sec. Hepatic arterial waveform is normal. Elevated velocities are consistent with demonstrated portal vein thrombosis and compensatory increased hepatic arterial flow. Splenic Vein Velocity:  26 cm/sec Varices: No abdominal varices or perisplenic varices visualized by ultrasound. Ascites: Large volume ascites identified in the peritoneal cavity, especially around the liver. The liver to self demonstrates evidence of advanced cirrhosis and is small, fibrotic and shrunken in appearance with grossly nodular surface contour. No gross hepatic masses identified by ultrasound. The spleen is not enlarged. IMPRESSION: 1. Complete thrombosis of the main portal vein with collateral vessels in the porta hepatis that appear to reconstitute intrahepatic portal vein radicles. Findings are consistent with cavernous transformation. Due to what appears to be chronic thrombosis of the portal vein, the patient is likely not a candidate for TIPS. CT of the abdomen and pelvis with contrast may be of additional  benefit in reconfirming portal vein thrombosis and assessing for other venous collateral pathways. 2. Compensatory increased hepatic arterial flow. 3. The hepatic veins are patent.  Large volume of ascites present. 4. The liver shows evidence of advanced cirrhosis. Electronically Signed   By: Aletta Edouard M.D.   On: 06/03/2018 08:17   Ir Paracentesis  Result Date: 05/31/2018 INDICATION: History of ETOH cirrhosis with recurrent ascites, esophageal varices, GI bleed and encephalopathy. Request for diagnostic and therapeutic paracentesis today. EXAM:  ULTRASOUND GUIDED PARACENTESIS MEDICATIONS: 1% lidocaine 10 mL COMPLICATIONS: None immediate. PROCEDURE: Informed written consent was obtained from the patient after a discussion of the risks, benefits and alternatives to treatment. A timeout was performed prior to the initiation of the procedure. Initial ultrasound scanning demonstrates a large amount of ascites within the right lower abdominal quadrant. The right lower abdomen was prepped and draped in the usual sterile fashion. 1% lidocaine with epinephrine was used for local anesthesia. Following this, a 19 gauge, 7-cm, Yueh catheter was introduced. An ultrasound image was saved for documentation purposes. The paracentesis was performed. The catheter was removed and a dressing was applied. The patient tolerated the procedure well without immediate post procedural complication. FINDINGS: A total of approximately 7 L of clear yellow fluid was removed. Samples were sent to the laboratory as requested by the clinical team. IMPRESSION: Successful ultrasound-guided paracentesis yielding 7 liters of peritoneal fluid. Read by: Gareth Eagle, PA-C Electronically Signed   By: Markus Daft M.D.   On: 05/31/2018 14:12    Lab Results  Component Value Date   WBC 6.0 06/14/2018   HGB 9.6 (L) 06/14/2018   HCT 28.8 (L) 06/14/2018   MCV 90.0 06/14/2018   PLT 81.0 (L) 06/14/2018    Lab Results  Component Value Date   CREATININE 0.80 06/14/2018   BUN 10 06/14/2018   NA 134 (L) 06/14/2018   K 3.6 06/14/2018   CL 103 06/14/2018   CO2 23 06/14/2018    Lab Results  Component Value Date   ALT 17 06/14/2018   AST 33 06/14/2018   ALKPHOS 144 (H) 06/14/2018   BILITOT 4.1 (H) 06/14/2018    Lab Results  Component Value Date   INR 1.6 (H) 06/14/2018   INR 1.47 06/04/2018   INR 1.64 06/02/2018     Physical Exam: BP 130/80   Pulse 92   Ht '5\' 4"'$  (1.626 m)   Wt 149 lb (67.6 kg)   BMI 25.58 kg/m  Constitutional: Pleasant,, female in no acute  distress. HEENT: Normocephalic and atraumatic. Conjunctivae are normal.(+) scleral icterus. Neck supple.  Cardiovascular: Normal rate, regular rhythm.  Pulmonary/chest: Effort normal and breath sounds normal. No wheezing, rales or rhonchi. Abdominal: distended with ascites, , nontender. . There are no masses palpable.  Extremities: (+) 1-2 LE edema Lymphadenopathy: No cervical adenopathy noted. Neurological: Alert and oriented to person place and time. No asterixis Skin: Skin is warm and dry. No rashes noted. Psychiatric: Normal mood and affect. Behavior is normal.   ASSESSMENT AND PLAN: 53 year old female here for reassessment of the following issues:  Alcoholic cirrhosis with ascites Esophageal varices Hepatic encephalopathy Elevated CA 125  Overall the patient is decompensated, meld of 17. She continues to drink beer as above. I had a lengthy discussion with the patient and her family about her progressive decompensation, risk for mortality with worsening. She absolutely needs to abstain from alcohol in order to minimize further progression. Further I counseled her she is not a candidate for liver transplant  she does not abstain from alcohol. When she starts to abstain I will refer her to hepatology for transplant evaluation. Otherwise she is not a candidate for TIPS for her esophageal varices or her ascites given her high meld score and portal vein thrombosis. We are left managing her varices with banding as well as managing her ascites with diuresis and frequent paracentesis. She does not appear to have [place]. Her elevated CA 125 could be coming from her cirrhosis and ascites, no evidence of malignancy is on cytology from ascitic fluid and prior imaging did not show any problems with the ovaries, however will send for a formal pelvic ultrasound to ensure okay.   Recommendations as below: - COMPLETE ABSTINENCE FROM ALCOHOL - low Na diet - increase lasix to '40mg'$  BID and aldactone to '200mg'$   daily - continue lactulose and Rifaximin - repeat labs on Friday (ensure platelets and INR okay prior to banding), recheck renal function on higher doses of diuretics - will send labs to ensure no other cause of chronic liver diseases - hepatitis A vaccine today, advised patient to get flu shot as well - paracentesis - large volume - albumin if > 5 L - EGD at the hospital on 1/15 for further banding of varices. Holding on propranolol given refractory ascites - follow up in clinic in 6-8 weeks  Northport Cellar, MD [facility] Gastroenterology

## 2018-06-29 NOTE — Patient Instructions (Addendum)
If you are age 54 or older, your body mass index should be between 23-30. Your Body mass index is 25.58 kg/m. If this is out of the aforementioned range listed, please consider follow up with your Primary Care Provider.  If you are age 80 or younger, your body mass index should be between 19-25. Your Body mass index is 25.58 kg/m. If this is out of the aformentioned range listed, please consider follow up with your Primary Care Provider.   Please discontinue alcohol use.  You have been scheduled for an abdominal paracentesis at [facility] radiology (1st floor of hospital) tomorrow on Wednesday, 06-30-2018 at 2:00pm. Please arrive at least 15 minutes prior to your appointment time for registration. Should you need to reschedule this appointment for any reason, please call our office at [phone].  You have been scheduled for an pelvic ultrasound at [facility] (1st floor of hospital) on Friday, 07-02-18 at 1:30pm. Please arrive 15 minutes prior to your appointment for registration. Please arrive with a FULL BLADDER. Should you need to reschedule your appointment, please contact radiology at [phone].   Please go to the lab in the basement of our building on Friday the 10th after your ultrasound to have blood work done for Dr. Havery Moros:  CBC, CMET and INR   We are giving you a Hepatitis A vaccine today.  You will be due for your 2nd and final injection in 6 months.  We will call you to schedule that appointment.   Please contact your Primary Care Provider or a local pharmacy to get a flu shot.   Increase your Lasix 40 mg to twice a day. Increase your Aldactone to 200mg  once in the morning.

## 2018-06-29 NOTE — Progress Notes (Signed)
HPI :  53 year old female here for follow-up visit. She has a history of decompensated alcoholic cirrhosis complicated by ascites, recurrent bleeding from esophageal varices, and hepatic encephalopathy. I initially met her in early November when she was admitted for variceal bleed. She required 2 EGDs for banding of varices at that time. She was unfortunately admitted about a month later for recurrent variceal bleeding, again requiring banding of varices. She was evaluated by IR for tips however given her high meld score and portal vein thrombosis she was not considered a good candidate for TIPS. Given her refractory ascites she has not been on a nonselective beta blocker.  She is here for a follow-up visit today. She is accompanied by her children. Daughter endorses that she continues to drink beer and has not been abstaining from alcohol. She endorses drinking about a 6 pack per week, although unclear how much she is consuming routinely. She is taking 150 mg of Aldactone a day and Lasix 60 mg a day for her ascites. She continues to require periodic large volume paracentesis, last was done on December 24. This is her main complaint today. She otherwise has been compliant with lactulose and rifaximin and patient and children feel her encephalopathy is well controlled. Of note she had a questionable nodular-appearing omentum with an elevated CA-125 level. Ascitic cytology 2 has been negative. She had a CT scan which did not show any problems in the pelvis her ovaries. She has no evidence of HCC.  Family is very concerned about her recurrent ascites and recurrent bleeding. The patient states she has been compliant with a low sodium diet. Of note she is not immune to hepatitis A and has not received a vaccine for that yet. She has not been referred to hepatology yet given her ongoing alcohol use.  She is scheduled for EGD 07/07/17   Past Medical History:  Diagnosis Date  . Alcoholic liver disease ([place])    . Chronic back pain    on disability  . Cirrhosis of liver ([place])   . Esophageal varices ([name])   . GERD (gastroesophageal reflux disease)   . Jaundice      Past Surgical History:  Procedure Laterality Date  . BACK SURGERY    . ESOPHAGEAL BANDING  04/25/2018   Procedure: ESOPHAGEAL BANDING;  Surgeon: Yetta Flock, MD;  Location: [facility] ENDOSCOPY;  Service: Gastroenterology;;  . ESOPHAGEAL BANDING  04/27/2018   Procedure: ESOPHAGEAL BANDING;  Surgeon: Milus Banister, MD;  Location: [facility] ENDOSCOPY;  Service: Endoscopy;;  . ESOPHAGEAL BANDING  06/02/2018   Procedure: ESOPHAGEAL BANDING;  Surgeon: Doran Stabler, MD;  Location: [facility] ENDOSCOPY;  Service: Gastroenterology;;  . ESOPHAGOGASTRODUODENOSCOPY N/A 04/27/2018   Procedure: ESOPHAGOGASTRODUODENOSCOPY (EGD);  Surgeon: Milus Banister, MD;  Location: [facility] ENDOSCOPY;  Service: Endoscopy;  Laterality: N/A;  . ESOPHAGOGASTRODUODENOSCOPY (EGD) WITH PROPOFOL N/A 04/25/2018   Procedure: ESOPHAGOGASTRODUODENOSCOPY (EGD) WITH PROPOFOL;  Surgeon: Yetta Flock, MD;  Location: [place];  Service: Gastroenterology;  Laterality: N/A;  . ESOPHAGOGASTRODUODENOSCOPY (EGD) WITH PROPOFOL N/A 06/02/2018   Procedure: ESOPHAGOGASTRODUODENOSCOPY (EGD) WITH PROPOFOL;  Surgeon: Doran Stabler, MD;  Location: [place];  Service: Gastroenterology;  Laterality: N/A;  . IR PARACENTESIS  04/26/2018  . IR PARACENTESIS  05/18/2018  . IR PARACENTESIS  05/31/2018   Family History  Problem Relation Age of Onset  . Colon cancer Neg Hx   . Esophageal cancer Neg Hx    Social History   Tobacco Use  . Smoking status:  Current Every Day Smoker    Packs/day: 0.75    Years: 20.00    Pack years: 15.00    Types: Cigarettes  . Smokeless tobacco: Never Used  Substance Use Topics  . Alcohol use: Not Currently    Comment: denies h/o withdrawal  . Drug use: Not Currently   Current Outpatient Medications  Medication Sig Dispense Refill  . ferrous  sulfate 325 (65 FE) MG EC tablet Take 1 tablet (325 mg total) by mouth 2 (two) times daily. 350 tablet 3  . folic acid (FOLVITE) 1 MG tablet Take 1 tablet (1 mg total) by mouth daily. 90 tablet 1  . Lactulose 20 GM/30ML SOLN Take 45 mLs (30 g total) by mouth 3 (three) times daily. Take 30 mls every eight hours. Titrate for 3 bm per 24 hrs 1892 mL 6  . Multiple Vitamin (MULTIVITAMIN WITH MINERALS) TABS tablet Take 1 tablet by mouth daily. 90 tablet 1  . pantoprazole (PROTONIX) 40 MG tablet Take 1 tablet (40 mg total) by mouth 2 (two) times daily. 30 tablet 3  . potassium chloride (K-DUR) 10 MEQ tablet Take 1 tablet (10 mEq total) by mouth daily. (Patient taking differently: Take 5 mEq by mouth daily. ) 30 tablet 0  . rifaximin (XIFAXAN) 550 MG TABS tablet Take 1 tablet (550 mg total) by mouth 2 (two) times daily. 60 tablet 11  . thiamine 100 MG tablet Take 1 tablet (100 mg total) by mouth daily. 30 tablet 0  . furosemide (LASIX) 20 MG tablet Take 3 tablets (60 mg total) by mouth daily. 90 tablet 1  . spironolactone (ALDACTONE) 100 MG tablet Take 1.5 tablets (150 mg total) by mouth daily. 45 tablet 1   No current facility-administered medications for this visit.    Allergies  Allergen Reactions  . Contrast Media [Iodinated Diagnostic Agents] Itching and Swelling  . Latex Itching and Swelling    No breathing impairment, however  . Sulfa Antibiotics Itching and Swelling    No breathing impairment, however     Review of Systems: All systems reviewed and negative except where noted in HPI.    US Paracentesis  Result Date: 06/15/2018 INDICATION: Patient with history of ETOH cirrhosis with recurrent ascites requiring repeated large volume paracenteses. Request to IR for diagnostic and therapeutic paracentesis today with post procedure albumin administration if >5L removed. EXAM: ULTRASOUND GUIDED DIAGNOSTIC AND THERAPEUTIC PARACENTESIS MEDICATIONS: 10 mL 1% lidocaine. COMPLICATIONS: None  immediate. PROCEDURE: Informed written consent was obtained from the patient after a discussion of the risks, benefits and alternatives to treatment. A timeout was performed prior to the initiation of the procedure. Initial ultrasound scanning demonstrates a large amount of ascites within the right lower abdominal quadrant. The right lower abdomen was prepped and draped in the usual sterile fashion. 1% lidocaine was used for local anesthesia. Following this, a 19 gauge, 7-cm, Yueh catheter was introduced. An ultrasound image was saved for documentation purposes. The paracentesis was performed. The catheter was removed and a dressing was applied. The patient tolerated the procedure well without immediate post procedural complication. FINDINGS: A total of approximately 7.0 L of clear yellow fluid was removed. Samples were sent to the laboratory as requested by the clinical team. IMPRESSION: Successful ultrasound-guided paracentesis yielding 7.0 liters of peritoneal fluid. Read by Candiss Norse, PA-C Electronically Signed   By: Sandi Mariscal M.D.   On: 06/15/2018 10:12   US Paracentesis  Result Date: 06/06/2018 INDICATION: History of alcoholic cirrhosis now with recurrent symptomatic ascites.  Please perform ultrasound-guided paracentesis for therapeutic purposes with max paracentesis of volume of 4 L. EXAM: ULTRASOUND-GUIDED PARACENTESIS COMPARISON:  Multiple previous ultrasound-guided paracenteses, most recently on 05/31/2018 yielding 7 L MEDICATIONS: None. COMPLICATIONS: None immediate. TECHNIQUE: Informed written consent was obtained from the patient after a discussion of the risks, benefits and alternatives to treatment. A timeout was performed prior to the initiation of the procedure. Initial ultrasound scanning demonstrates a large amount of ascites within the right lower abdominal quadrant. The right lower abdomen was prepped and draped in the usual sterile fashion. 1% lidocaine with epinephrine was used  for local anesthesia. Under direct ultrasound guidance, a 19 gauge, 7-cm, Yueh catheter was introduced. An ultrasound image was saved for documentation purposed. The paracentesis was performed. The catheter was removed and a dressing was applied. The patient tolerated the procedure well without immediate post procedural complication. FINDINGS: A total of approximately 4 liters of serous fluid was removed. IMPRESSION: Successful ultrasound-guided paracentesis yielding 4 liters of peritoneal fluid. Electronically Signed   By: Sandi Mariscal M.D.   On: 06/06/2018 10:54   Dg Chest Port 1 View  Result Date: 06/05/2018 CLINICAL DATA:  Respiratory failure. EXAM: PORTABLE CHEST 1 VIEW COMPARISON:  Radiograph June 04, 2018. FINDINGS: The heart size and mediastinal contours are within normal limits. Both lungs are clear. No pneumothorax or pleural effusion is noted. The visualized skeletal structures are unremarkable. IMPRESSION: No active disease. Electronically Signed   By: Marijo Conception, M.D.   On: 06/05/2018 09:08   Dg Chest Port 1 View  Result Date: 06/04/2018 CLINICAL DATA:  Respiratory failure EXAM: PORTABLE CHEST 1 VIEW COMPARISON:  None. FINDINGS: Hypoventilation with decreased lung volume and bibasilar atelectasis. Negative for edema or effusion. Heart size normal. IMPRESSION: Hypoventilation with bibasilar atelectasis. Electronically Signed   By: Franchot Gallo M.D.   On: 06/04/2018 07:58   US Liver Doppler  Result Date: 06/03/2018 CLINICAL DATA:  Cirrhosis, portal hypertension, acute variceal bleeding from esophageal varices, ascites and possible need for TIPS procedure. Assessment of liver vasculature and portal vein patency prior to possible TIPS. EXAM: DUPLEX ULTRASOUND OF LIVER TECHNIQUE: Color and duplex Doppler ultrasound was performed to evaluate the hepatic in-flow and out-flow vessels. COMPARISON:  CT of the abdomen without contrast on 04/25/2018 FINDINGS: Portal Vein Velocities The  portal vein is completely occluded and contains thrombus. There are some collateral vessels in the porta hepatis that appear to reconstitute some intrahepatic portal vein radicles in both right and left lobes. Findings are consistent with cavernous transformation. Hepatic Vein Velocities Right:  12 cm/sec Middle:  19 cm/sec Left:  30 cm/sec Hepatic Artery Velocity: 196 cm/sec. Hepatic arterial waveform is normal. Elevated velocities are consistent with demonstrated portal vein thrombosis and compensatory increased hepatic arterial flow. Splenic Vein Velocity:  26 cm/sec Varices: No abdominal varices or perisplenic varices visualized by ultrasound. Ascites: Large volume ascites identified in the peritoneal cavity, especially around the liver. The liver to self demonstrates evidence of advanced cirrhosis and is small, fibrotic and shrunken in appearance with grossly nodular surface contour. No gross hepatic masses identified by ultrasound. The spleen is not enlarged. IMPRESSION: 1. Complete thrombosis of the main portal vein with collateral vessels in the porta hepatis that appear to reconstitute intrahepatic portal vein radicles. Findings are consistent with cavernous transformation. Due to what appears to be chronic thrombosis of the portal vein, the patient is likely not a candidate for TIPS. CT of the abdomen and pelvis with contrast may be of additional  benefit in reconfirming portal vein thrombosis and assessing for other venous collateral pathways. 2. Compensatory increased hepatic arterial flow. 3. The hepatic veins are patent.  Large volume of ascites present. 4. The liver shows evidence of advanced cirrhosis. Electronically Signed   By: Aletta Edouard M.D.   On: 06/03/2018 08:17   Ir Paracentesis  Result Date: 05/31/2018 INDICATION: History of ETOH cirrhosis with recurrent ascites, esophageal varices, GI bleed and encephalopathy. Request for diagnostic and therapeutic paracentesis today. EXAM:  ULTRASOUND GUIDED PARACENTESIS MEDICATIONS: 1% lidocaine 10 mL COMPLICATIONS: None immediate. PROCEDURE: Informed written consent was obtained from the patient after a discussion of the risks, benefits and alternatives to treatment. A timeout was performed prior to the initiation of the procedure. Initial ultrasound scanning demonstrates a large amount of ascites within the right lower abdominal quadrant. The right lower abdomen was prepped and draped in the usual sterile fashion. 1% lidocaine with epinephrine was used for local anesthesia. Following this, a 19 gauge, 7-cm, Yueh catheter was introduced. An ultrasound image was saved for documentation purposes. The paracentesis was performed. The catheter was removed and a dressing was applied. The patient tolerated the procedure well without immediate post procedural complication. FINDINGS: A total of approximately 7 L of clear yellow fluid was removed. Samples were sent to the laboratory as requested by the clinical team. IMPRESSION: Successful ultrasound-guided paracentesis yielding 7 liters of peritoneal fluid. Read by: Gareth Eagle, PA-C Electronically Signed   By: Markus Daft M.D.   On: 05/31/2018 14:12    Lab Results  Component Value Date   WBC 6.0 06/14/2018   HGB 9.6 (L) 06/14/2018   HCT 28.8 (L) 06/14/2018   MCV 90.0 06/14/2018   PLT 81.0 (L) 06/14/2018    Lab Results  Component Value Date   CREATININE 0.80 06/14/2018   BUN 10 06/14/2018   NA 134 (L) 06/14/2018   K 3.6 06/14/2018   CL 103 06/14/2018   CO2 23 06/14/2018    Lab Results  Component Value Date   ALT 17 06/14/2018   AST 33 06/14/2018   ALKPHOS 144 (H) 06/14/2018   BILITOT 4.1 (H) 06/14/2018    Lab Results  Component Value Date   INR 1.6 (H) 06/14/2018   INR 1.47 06/04/2018   INR 1.64 06/02/2018     Physical Exam: BP 130/80   Pulse 92   Ht '5\' 4"'$  (1.626 m)   Wt 149 lb (67.6 kg)   BMI 25.58 kg/m  Constitutional: Pleasant,, female in no acute  distress. HEENT: Normocephalic and atraumatic. Conjunctivae are normal.(+) scleral icterus. Neck supple.  Cardiovascular: Normal rate, regular rhythm.  Pulmonary/chest: Effort normal and breath sounds normal. No wheezing, rales or rhonchi. Abdominal: distended with ascites, , nontender. . There are no masses palpable.  Extremities: (+) 1-2 LE edema Lymphadenopathy: No cervical adenopathy noted. Neurological: Alert and oriented to person place and time. No asterixis Skin: Skin is warm and dry. No rashes noted. Psychiatric: Normal mood and affect. Behavior is normal.   ASSESSMENT AND PLAN: 53 year old female here for reassessment of the following issues:  Alcoholic cirrhosis with ascites Esophageal varices Hepatic encephalopathy Elevated CA 125  Overall the patient is decompensated, meld of 17. She continues to drink beer as above. I had a lengthy discussion with the patient and her family about her progressive decompensation, risk for mortality with worsening. She absolutely needs to abstain from alcohol in order to minimize further progression. Further I counseled her she is not a candidate for liver transplant  she does not abstain from alcohol. When she starts to abstain I will refer her to hepatology for transplant evaluation. Otherwise she is not a candidate for TIPS for her esophageal varices or her ascites given her high meld score and portal vein thrombosis. We are left managing her varices with banding as well as managing her ascites with diuresis and frequent paracentesis. She does not appear to have [place]. Her elevated CA 125 could be coming from her cirrhosis and ascites, no evidence of malignancy is on cytology from ascitic fluid and prior imaging did not show any problems with the ovaries, however will send for a formal pelvic ultrasound to ensure okay.   Recommendations as below: - COMPLETE ABSTINENCE FROM ALCOHOL - low Na diet - increase lasix to '40mg'$  BID and aldactone to '200mg'$   daily - continue lactulose and Rifaximin - repeat labs on Friday (ensure platelets and INR okay prior to banding), recheck renal function on higher doses of diuretics - will send labs to ensure no other cause of chronic liver diseases - hepatitis A vaccine today, advised patient to get flu shot as well - paracentesis - large volume - albumin if > 5 L - EGD at the hospital on 1/15 for further banding of varices. Holding on propranolol given refractory ascites - follow up in clinic in 6-8 weeks  Marsing Cellar, MD [facility] Gastroenterology

## 2018-06-29 NOTE — Telephone Encounter (Signed)
Patient's daughter notified of the results and recommendations She has follow up arranged for today She understands to have her come for labs on 07/05/18 She verbalized understanding that her mom needs to arrive at [facility] admitting 12:30 07/07/18 and be NPO after midnight.

## 2018-06-30 ENCOUNTER — Ambulatory Visit (HOSPITAL_COMMUNITY)
Admission: RE | Admit: 2018-06-30 | Discharge: 2018-06-30 | Disposition: A | Discharge status: Home / Self Care | Payer: Medicare Other | Source: Ambulatory Visit | Attending: Gastroenterology | Admitting: Gastroenterology

## 2018-06-30 ENCOUNTER — Encounter (HOSPITAL_COMMUNITY): Payer: Self-pay | Admitting: Physician Assistant

## 2018-06-30 DIAGNOSIS — K7031 Alcoholic cirrhosis of liver with ascites: Secondary | ICD-10-CM | POA: Diagnosis present

## 2018-06-30 HISTORY — PX: IR PARACENTESIS: IMG2679

## 2018-06-30 LAB — ALBUMIN, PLEURAL OR PERITONEAL FLUID: Albumin, Fluid: <1 g/dL

## 2018-06-30 LAB — BODY FLUID CELL COUNT WITH DIFFERENTIAL
Eos, Fluid: 0 %
Lymphs, Fluid: 17 %
Monocyte-Macrophage-Serous Fluid: 76 % (ref 50–90)
Neutrophil Count, Fluid: 7 % (ref 0–25)
Total Nucleated Cell Count, Fluid: 129 cu mm (ref 0–1000)

## 2018-06-30 MED ORDER — ALBUMIN HUMAN 25 % IV SOLN
50.0000 g | Freq: Once | INTRAVENOUS | Status: AC
  Administered 2018-06-30: 50 g via INTRAVENOUS

## 2018-06-30 MED ORDER — LIDOCAINE HCL 1 % IJ SOLN
INTRAMUSCULAR | Status: AC
  Filled 2018-06-30: qty 20

## 2018-06-30 MED ORDER — ALBUMIN HUMAN 25 % IV SOLN
INTRAVENOUS | Status: AC
  Filled 2018-06-30: qty 200

## 2018-06-30 MED ORDER — LIDOCAINE HCL 1 % IJ SOLN
INTRAMUSCULAR | Status: DC | PRN
  Administered 2018-06-30: 10 mL

## 2018-06-30 NOTE — Procedures (Signed)
PROCEDURE SUMMARY:  Successful image-guided paracentesis from the right lower abdomen.  Yielded 6.0 liters of clear yellow fluid.  No immediate complications.  EBL: zero Patient tolerated well.   Specimen was sent for labs.  Please see imaging section of Epic for full dictation.  Joaquim Nam PA-C 06/30/2018 2:18 PM

## 2018-07-01 LAB — PATHOLOGIST SMEAR REVIEW

## 2018-07-02 ENCOUNTER — Ambulatory Visit (HOSPITAL_COMMUNITY)
Admission: RE | Admit: 2018-07-02 | Discharge: 2018-07-02 | Disposition: A | Discharge status: Home / Self Care | Payer: Medicare Other | Source: Ambulatory Visit | Attending: Gastroenterology | Admitting: Gastroenterology

## 2018-07-02 ENCOUNTER — Other Ambulatory Visit (INDEPENDENT_AMBULATORY_CARE_PROVIDER_SITE_OTHER): Payer: Medicare Other

## 2018-07-02 DIAGNOSIS — K7290 Hepatic failure, unspecified without coma: Secondary | ICD-10-CM | POA: Diagnosis not present

## 2018-07-02 DIAGNOSIS — R971 Elevated cancer antigen 125 [CA 125]: Secondary | ICD-10-CM | POA: Diagnosis not present

## 2018-07-02 DIAGNOSIS — K7460 Unspecified cirrhosis of liver: Secondary | ICD-10-CM

## 2018-07-02 DIAGNOSIS — K7031 Alcoholic cirrhosis of liver with ascites: Secondary | ICD-10-CM | POA: Diagnosis not present

## 2018-07-02 LAB — CBC WITH DIFFERENTIAL/PLATELET
Basophils Relative: 0 % (ref 0.0–3.0)
Eosinophils Relative: 0 % (ref 0.0–5.0)
HCT: 31.4 % — ABNORMAL LOW (ref 36.0–46.0)
Hemoglobin: 10.6 g/dL — ABNORMAL LOW (ref 12.0–15.0)
LYMPHS PCT: 34 % (ref 12.0–46.0)
MCHC: 33.7 g/dL (ref 30.0–36.0)
MCV: 87.6 fl (ref 78.0–100.0)
MONOS PCT: 4 % (ref 3.0–12.0)
NEUTROS PCT: 62 % (ref 43.0–77.0)
Platelets: 147 10*3/uL — ABNORMAL LOW (ref 150.0–400.0)
RBC: 3.59 Mil/uL — ABNORMAL LOW (ref 3.87–5.11)
RDW: 20.8 % — ABNORMAL HIGH (ref 11.5–15.5)
WBC: 7.4 10*3/uL (ref 4.0–10.5)

## 2018-07-02 LAB — COMPREHENSIVE METABOLIC PANEL
ALT: 20 U/L (ref 0–35)
AST: 40 U/L — ABNORMAL HIGH (ref 0–37)
Albumin: 3.5 g/dL (ref 3.5–5.2)
Alkaline Phosphatase: 219 U/L — ABNORMAL HIGH (ref 39–117)
BUN: 10 mg/dL (ref 6–23)
CO2: 27 mEq/L (ref 19–32)
Calcium: 9.6 mg/dL (ref 8.4–10.5)
Chloride: 102 mEq/L (ref 96–112)
Creatinine, Ser: 0.72 mg/dL (ref 0.40–1.20)
GFR: 108.76 mL/min (ref >60.00)
GLUCOSE: 82 mg/dL (ref 70–99)
POTASSIUM: 3.5 meq/L (ref 3.5–5.1)
Sodium: 137 mEq/L (ref 135–145)
Total Bilirubin: 2.7 mg/dL — ABNORMAL HIGH (ref 0.2–1.2)
Total Protein: 6.3 g/dL (ref 6.0–8.3)

## 2018-07-02 LAB — FERRITIN: Ferritin: 14.8 ng/mL (ref 10.0–291.0)

## 2018-07-02 LAB — PROTIME-INR
INR: 1.6 ratio — ABNORMAL HIGH (ref 0.8–1.0)
Prothrombin Time: 18.9 s — ABNORMAL HIGH (ref 9.6–13.1)

## 2018-07-03 LAB — IRON, TOTAL/TOTAL IRON BINDING CAP
%SAT: 17 % (calc) (ref 16–45)
Iron: 30 ug/dL — ABNORMAL LOW (ref 45–160)
TIBC: 180 mcg/dL (calc) — ABNORMAL LOW (ref 250–450)

## 2018-07-03 LAB — IGG: IgG (Immunoglobin G), Serum: 1144 mg/dL (ref 600–1640)

## 2018-07-06 ENCOUNTER — Encounter (HOSPITAL_COMMUNITY): Payer: Self-pay | Admitting: *Deleted

## 2018-07-06 ENCOUNTER — Other Ambulatory Visit: Payer: Self-pay

## 2018-07-07 ENCOUNTER — Encounter (HOSPITAL_COMMUNITY): Payer: Self-pay | Admitting: *Deleted

## 2018-07-07 ENCOUNTER — Encounter (HOSPITAL_COMMUNITY)
Admission: RE | Disposition: A | Discharge status: Home / Self Care | Payer: Self-pay | Source: Home / Self Care | Attending: Gastroenterology

## 2018-07-07 ENCOUNTER — Ambulatory Visit (HOSPITAL_COMMUNITY)
Admission: RE | Admit: 2018-07-07 | Discharge: 2018-07-07 | Disposition: A | Discharge status: Home / Self Care | Payer: Medicare Other | Attending: Gastroenterology | Admitting: Gastroenterology

## 2018-07-07 ENCOUNTER — Ambulatory Visit (HOSPITAL_COMMUNITY): Payer: Medicare Other | Admitting: Anesthesiology

## 2018-07-07 ENCOUNTER — Other Ambulatory Visit: Payer: Self-pay

## 2018-07-07 DIAGNOSIS — I8501 Esophageal varices with bleeding: Secondary | ICD-10-CM

## 2018-07-07 DIAGNOSIS — R971 Elevated cancer antigen 125 [CA 125]: Secondary | ICD-10-CM | POA: Insufficient documentation

## 2018-07-07 DIAGNOSIS — K7460 Unspecified cirrhosis of liver: Secondary | ICD-10-CM

## 2018-07-07 DIAGNOSIS — K7031 Alcoholic cirrhosis of liver with ascites: Secondary | ICD-10-CM | POA: Diagnosis present

## 2018-07-07 DIAGNOSIS — E876 Hypokalemia: Secondary | ICD-10-CM | POA: Diagnosis not present

## 2018-07-07 DIAGNOSIS — F17210 Nicotine dependence, cigarettes, uncomplicated: Secondary | ICD-10-CM | POA: Diagnosis not present

## 2018-07-07 DIAGNOSIS — K7290 Hepatic failure, unspecified without coma: Secondary | ICD-10-CM

## 2018-07-07 DIAGNOSIS — K219 Gastro-esophageal reflux disease without esophagitis: Secondary | ICD-10-CM | POA: Diagnosis not present

## 2018-07-07 DIAGNOSIS — I81 Portal vein thrombosis: Secondary | ICD-10-CM | POA: Insufficient documentation

## 2018-07-07 DIAGNOSIS — Z79899 Other long term (current) drug therapy: Secondary | ICD-10-CM | POA: Diagnosis not present

## 2018-07-07 DIAGNOSIS — I8500 Esophageal varices without bleeding: Secondary | ICD-10-CM | POA: Diagnosis not present

## 2018-07-07 DIAGNOSIS — I8510 Secondary esophageal varices without bleeding: Secondary | ICD-10-CM | POA: Insufficient documentation

## 2018-07-07 DIAGNOSIS — D649 Anemia, unspecified: Secondary | ICD-10-CM | POA: Diagnosis not present

## 2018-07-07 DIAGNOSIS — F1010 Alcohol abuse, uncomplicated: Secondary | ICD-10-CM | POA: Diagnosis not present

## 2018-07-07 HISTORY — PX: ESOPHAGOGASTRODUODENOSCOPY (EGD) WITH PROPOFOL: SHX5813

## 2018-07-07 HISTORY — PX: ESOPHAGEAL BANDING: SHX5518

## 2018-07-07 SURGERY — ESOPHAGOGASTRODUODENOSCOPY (EGD) WITH PROPOFOL
Anesthesia: Monitor Anesthesia Care

## 2018-07-07 MED ORDER — SODIUM CHLORIDE 0.9 % IV SOLN: INTRAVENOUS | Status: DC

## 2018-07-07 MED ORDER — MIDAZOLAM HCL 5 MG/5ML IJ SOLN
INTRAMUSCULAR | Status: DC | PRN
  Administered 2018-07-07: 2 mg via INTRAVENOUS

## 2018-07-07 MED ORDER — MIDAZOLAM HCL 2 MG/2ML IJ SOLN
INTRAMUSCULAR | Status: AC
  Filled 2018-07-07: qty 2

## 2018-07-07 MED ORDER — PROPOFOL 10 MG/ML IV BOLUS
INTRAVENOUS | Status: AC
  Filled 2018-07-07: qty 40

## 2018-07-07 MED ORDER — ONDANSETRON HCL 4 MG/2ML IJ SOLN
INTRAMUSCULAR | Status: DC | PRN
  Administered 2018-07-07: 4 mg via INTRAVENOUS

## 2018-07-07 MED ORDER — PROPOFOL 500 MG/50ML IV EMUL
INTRAVENOUS | Status: DC | PRN
  Administered 2018-07-07: 50 mg via INTRAVENOUS
  Administered 2018-07-07: 20 mg via INTRAVENOUS

## 2018-07-07 MED ORDER — LACTATED RINGERS IV SOLN
INTRAVENOUS | Status: DC
  Administered 2018-07-07: 13:00:00 via INTRAVENOUS

## 2018-07-07 MED ORDER — PROPOFOL 500 MG/50ML IV EMUL
INTRAVENOUS | Status: DC | PRN
  Administered 2018-07-07: 125 ug/kg/min via INTRAVENOUS

## 2018-07-07 SURGICAL SUPPLY — 15 items

## 2018-07-07 NOTE — Interval H&P Note (Signed)
History and Physical Interval Note:  07/07/2018 1:09 PM  Katie Holland  has presented today for surgery, with the diagnosis of varices  The various methods of treatment have been discussed with the patient and family. After consideration of risks, benefits and other options for treatment, the patient has consented to  Procedure(s): ESOPHAGOGASTRODUODENOSCOPY (EGD) WITH PROPOFOL (N/A) ESOPHAGEAL BANDING (N/A) as a surgical intervention .  The patient's history has been reviewed, patient examined, no change in status, stable for surgery.  I have reviewed the patient's chart and labs.  Questions were answered to the patient's satisfaction.     [place]

## 2018-07-07 NOTE — Anesthesia Postprocedure Evaluation (Signed)
Anesthesia Post Note  Patient: Katie Holland  Procedure(s) Performed: ESOPHAGOGASTRODUODENOSCOPY (EGD) WITH PROPOFOL (N/A ) ESOPHAGEAL BANDING (N/A )     Patient location during evaluation: Endoscopy Anesthesia Type: MAC Level of consciousness: awake and alert Pain management: pain level controlled Vital Signs Assessment: post-procedure vital signs reviewed and stable Respiratory status: spontaneous breathing, nonlabored ventilation and respiratory function stable Cardiovascular status: stable and blood pressure returned to baseline Postop Assessment: no apparent nausea or vomiting Anesthetic complications: no    Last Vitals:  Vitals:   07/07/18 1500 07/07/18 1510  BP: (!) 143/91 (!) 137/92  Pulse: (!) 107 (!) 108  Resp: 12 (!) 22  Temp:    SpO2: 100% 97%    Last Pain:  Vitals:   07/07/18 1510  TempSrc:   PainSc: 0-No pain                 Lynda Rainwater

## 2018-07-07 NOTE — Op Note (Signed)
[facility] Patient Name: Katie Holland Procedure Date: 07/07/2018 MRN: [id number] Attending MD: Carlota Raspberry. Havery Moros , MD Date of Birth: May 03, 1966 CSN: [id number] Age: 53 Admit Type: Outpatient Procedure:                Upper GI endoscopy Indications:              For therapy of esophageal varices - history of                            bleeding esophageal varices x 2 since November.                            Total of 15 bands placed during 3 EGDs since that                            time. No bleeding since last EGD, here for further                            treatment to prevent rebleeding (not candidate for                            TIPS, and due to refractory ascites not on                            propranolol right now) Providers:                Carlota Raspberry. Havery Moros, MD, Truddie Coco, RN, Charolette Child, Technician, Arnoldo Hooker, CRNA Referring MD:              Medicines:                Monitored Anesthesia Care Complications:            No immediate complications. Estimated blood loss:                            Minimal. Estimated Blood Loss:     Estimated blood loss was minimal. Procedure:                Pre-Anesthesia Assessment:                           - Prior to the procedure, a History and Physical                            was performed, and patient medications and                            allergies were reviewed. The patient's tolerance of                            previous anesthesia was also reviewed. The risks  and benefits of the procedure and the sedation                            options and risks were discussed with the patient.                            All questions were answered, and informed consent                            was obtained. Prior Anticoagulants: The patient has                            taken no previous anticoagulant or antiplatelet                            agents.  ASA Grade Assessment: III - A patient with                            severe systemic disease. After reviewing the risks                            and benefits, the patient was deemed in                            satisfactory condition to undergo the procedure.                           After obtaining informed consent, the endoscope was                            passed under direct vision. Throughout the                            procedure, the patient's blood pressure, pulse, and                            oxygen saturations were monitored continuously. The                            GIF-H190 ([id number] Olympus gastroscope was                            introduced through the mouth, and advanced to the                            second part of duodenum. The upper GI endoscopy was                            accomplished without difficulty. The patient                            tolerated the procedure well. Scope In: Scope Out: Findings:      3 columns of varices were found in the lower  third of the esophagus.       Stigmata of prior banding noted, the varices in the mid esophagus looked       considerably improved from her initial endoscopy. Overall varices are       smaller than previous but remain large. Four bands were successfully       placed, starting at the GEJ and working up proximally, resulting in       deflation of varices.      The exam of the esophagus was otherwise normal.      The entire examined stomach was normal.      The duodenal bulb and second portion of the duodenum were normal. Impression:               - Large esophageal varices as above - overall                            improved from previous from prior banding with                            multiple areas of scarring, but remain large.                            Banded x 4                           - Normal stomach.                           - Normal duodenal bulb and second portion of the                             duodenum. Moderate Sedation:      No moderate sedation, case performed with MAC Recommendation:           - Patient has a contact number available for                            emergencies. The signs and symptoms of potential                            delayed complications were discussed with the                            patient. Return to normal activities tomorrow.                            Written discharge instructions were provided to the                            patient.                           - Liquid diet this afternoon, then soft tomorrow.                           - Continue present medications.                           -  Repeat upper endoscopy in 2-4 weeks for                            retreatment. Our office will contact you for                            scheduling Procedure Code(s):        --- Professional ---                           [phone], Esophagogastroduodenoscopy, flexible,                            transoral; with band ligation of esophageal/gastric                            varices Diagnosis Code(s):        --- Professional ---                           I85.00, Esophageal varices without bleeding CPT copyright 2018 American Medical Association. All rights reserved. The codes documented in this report are preliminary and upon coder review may  be revised to meet current compliance requirements. Remo Lipps P. Jaston Havens, MD 07/07/2018 2:54:48 PM This report has been signed electronically. Number of Addenda: 0

## 2018-07-07 NOTE — Discharge Instructions (Signed)
YOU HAD AN ENDOSCOPIC PROCEDURE TODAY: Refer to the procedure report and other information in the discharge instructions given to you for any specific questions about what was found during the examination. If this information does not answer your questions, please call [place] office at [phone] to clarify.   YOU SHOULD EXPECT: Some feelings of bloating in the abdomen. Passage of more gas than usual. Walking can help get rid of the air that was put into your GI tract during the procedure and reduce the bloating. If you had a lower endoscopy (such as a colonoscopy or flexible sigmoidoscopy) you may notice spotting of blood in your stool or on the toilet paper. Some abdominal soreness may be present for a day or two, also.  DIET: Your first meal following the procedure should be a light meal and then it is ok to progress to your normal diet. A half-sandwich or bowl of soup is an example of a good first meal. Heavy or fried foods are harder to digest and may make you feel nauseous or bloated. Drink plenty of fluids but you should avoid alcoholic beverages for 24 hours. If you had a esophageal dilation, please see attached instructions for diet.    ACTIVITY: Your care partner should take you home directly after the procedure. You should plan to take it easy, moving slowly for the rest of the day. You can resume normal activity the day after the procedure however YOU SHOULD NOT DRIVE, use power tools, machinery or perform tasks that involve climbing or major physical exertion for 24 hours (because of the sedation medicines used during the test).   SYMPTOMS TO REPORT IMMEDIATELY: A gastroenterologist can be reached at any hour. Please call [phone]  for any of the following symptoms:   Following upper endoscopy (EGD, EUS, ERCP, esophageal dilation) Vomiting of blood or coffee ground material  New, significant abdominal pain  New, significant chest pain or pain under the shoulder blades  Painful or  persistently difficult swallowing  New shortness of breath  Black, tarry-looking or red, bloody stools  FOLLOW UP:  If any biopsies were taken you will be contacted by phone or by letter within the next 1-3 weeks. Call [phone]  if you have not heard about the biopsies in 3 weeks.  Please also call with any specific questions about appointments or follow up tests.  

## 2018-07-07 NOTE — Anesthesia Preprocedure Evaluation (Signed)
Anesthesia Evaluation  Patient identified by MRN, date of birth, ID band Patient awake    Reviewed: Allergy & Precautions, NPO status , Patient's Chart, lab work & pertinent test results  History of Anesthesia Complications Negative for: history of anesthetic complications  Airway Mallampati: I  TM Distance: >3 FB Neck ROM: Full    Dental no notable dental hx.    Pulmonary Current Smoker,    Pulmonary exam normal breath sounds clear to auscultation       Cardiovascular negative cardio ROS Normal cardiovascular exam Rhythm:Regular Rate:Normal     Neuro/Psych negative neurological ROS  negative psych ROS   GI/Hepatic GERD  ,(+) Cirrhosis   Esophageal Varices and ascites  substance abuse  alcohol use,   Endo/Other  negative endocrine ROS  Renal/GU negative Renal ROS  negative genitourinary   Musculoskeletal negative musculoskeletal ROS (+)   Abdominal   Peds  Hematology  (+) anemia ,   Anesthesia Other Findings EGD for bleeding esophageal varices. No vomiting since last night.  Reproductive/Obstetrics                             Anesthesia Physical  Anesthesia Plan  ASA: III  Anesthesia Plan: MAC   Post-op Pain Management:    Induction:   PONV Risk Score and Plan: 1 and Propofol infusion and Treatment may vary due to age or medical condition  Airway Management Planned: Nasal Cannula and Simple Face Mask  Additional Equipment: None  Intra-op Plan:   Post-operative Plan: Extubation in OR  Informed Consent: I have reviewed the patients History and Physical, chart, labs and discussed the procedure including the risks, benefits and alternatives for the proposed anesthesia with the patient or authorized representative who has indicated his/her understanding and acceptance.       Plan Discussed with:   Anesthesia Plan Comments:         Anesthesia Quick Evaluation

## 2018-07-07 NOTE — Transfer of Care (Signed)
Immediate Anesthesia Transfer of Care Note  Patient: Katie Holland  Procedure(s) Performed: Procedure(s): ESOPHAGOGASTRODUODENOSCOPY (EGD) WITH PROPOFOL (N/A) ESOPHAGEAL BANDING (N/A)  Patient Location: PACU  Anesthesia Type:MAC  Level of Consciousness:  sedated, patient cooperative and responds to stimulation  Airway & Oxygen Therapy:Patient Spontanous Breathing and Patient connected to face mask oxgen  Post-op Assessment:  Report given to PACU RN and Post -op Vital signs reviewed and stable  Post vital signs:  Reviewed and stable  Last Vitals:  Vitals:   07/07/18 1235  BP: 118/88  Pulse: 94  Resp: 12  Temp: 36.7 C  SpO2: 858%    Complications: No apparent anesthesia complications

## 2018-07-08 ENCOUNTER — Telehealth: Payer: Self-pay

## 2018-07-08 DIAGNOSIS — K7460 Unspecified cirrhosis of liver: Secondary | ICD-10-CM

## 2018-07-08 DIAGNOSIS — K7290 Hepatic failure, unspecified without coma: Secondary | ICD-10-CM

## 2018-07-08 DIAGNOSIS — I8500 Esophageal varices without bleeding: Secondary | ICD-10-CM

## 2018-07-08 NOTE — Telephone Encounter (Signed)
-----   Message from Yetta Flock, MD sent at 07/07/2018  2:55 PM EST ----- Regarding: banding at hospital Jan remind me in clinic tomorrow, need to look at the hospital schedule to coordinate another variceal banding for this patient. Thanks

## 2018-07-08 NOTE — Telephone Encounter (Signed)
Per Dr. Havery Moros, scheduled pt for EGD at [facility] with banding of Esophageal Varices on 07-27-18 at 9:15am.   Pt To arrive at 7:45am.  Letter with instructions mailed to patient.  Will call patient to discuss instructions on Monday as she just went home from hospital yesterday after EGD with multiple esophageal bandings.

## 2018-07-12 ENCOUNTER — Telehealth: Payer: Self-pay

## 2018-07-12 ENCOUNTER — Encounter (HOSPITAL_COMMUNITY): Payer: Self-pay | Admitting: Gastroenterology

## 2018-07-12 NOTE — Telephone Encounter (Signed)
Called and spoke to pt.  Notified her regarding EGD at [facility] on 07-27-18 at 9:15am to arrive by 7:45am. She expressed that she can make that. Because today is a [place] there will be no mail delivery. She should receive the instructions that were mailed last week hopefully tomorrow or Wednesday. She will review them and call with any questions or concerns.  I reminded her about having to have a ride that could stay and drive her home. She indicated her daughter can bring her. She understands she must stop everything by mouth 3 hours prior and no solid foods after midnight.

## 2018-07-12 NOTE — Telephone Encounter (Signed)
-----   Message from Roetta Sessions, [place] sent at 07/08/2018  3:01 PM EST ----- Regarding: pt scheduled for EGD at [facility] on 07-27-18 Call pt to go over instructions for EGD (banding esophageal varices) at [facility] on 07-27-18. Instructions mailed to patient on Thursday 07-08-18.

## 2018-07-27 ENCOUNTER — Telehealth: Payer: Self-pay

## 2018-07-27 ENCOUNTER — Other Ambulatory Visit: Payer: Self-pay

## 2018-07-27 ENCOUNTER — Ambulatory Visit (HOSPITAL_COMMUNITY): Payer: Medicare Other | Admitting: Anesthesiology

## 2018-07-27 ENCOUNTER — Encounter (HOSPITAL_COMMUNITY): Payer: Self-pay

## 2018-07-27 ENCOUNTER — Encounter (HOSPITAL_COMMUNITY)
Admission: RE | Disposition: A | Discharge status: Home / Self Care | Payer: Self-pay | Source: Ambulatory Visit | Attending: Gastroenterology

## 2018-07-27 ENCOUNTER — Ambulatory Visit (HOSPITAL_COMMUNITY)
Admission: RE | Admit: 2018-07-27 | Discharge: 2018-07-27 | Disposition: A | Discharge status: Home / Self Care | Payer: Medicare Other | Source: Ambulatory Visit | Attending: Gastroenterology | Admitting: Gastroenterology

## 2018-07-27 DIAGNOSIS — K7290 Hepatic failure, unspecified without coma: Secondary | ICD-10-CM | POA: Insufficient documentation

## 2018-07-27 DIAGNOSIS — K219 Gastro-esophageal reflux disease without esophagitis: Secondary | ICD-10-CM | POA: Insufficient documentation

## 2018-07-27 DIAGNOSIS — K7031 Alcoholic cirrhosis of liver with ascites: Secondary | ICD-10-CM | POA: Diagnosis not present

## 2018-07-27 DIAGNOSIS — K920 Hematemesis: Secondary | ICD-10-CM | POA: Diagnosis not present

## 2018-07-27 DIAGNOSIS — R971 Elevated cancer antigen 125 [CA 125]: Secondary | ICD-10-CM | POA: Diagnosis not present

## 2018-07-27 DIAGNOSIS — G8929 Other chronic pain: Secondary | ICD-10-CM | POA: Diagnosis not present

## 2018-07-27 DIAGNOSIS — I8500 Esophageal varices without bleeding: Secondary | ICD-10-CM | POA: Insufficient documentation

## 2018-07-27 DIAGNOSIS — I8501 Esophageal varices with bleeding: Secondary | ICD-10-CM

## 2018-07-27 DIAGNOSIS — K766 Portal hypertension: Secondary | ICD-10-CM | POA: Insufficient documentation

## 2018-07-27 DIAGNOSIS — M549 Dorsalgia, unspecified: Secondary | ICD-10-CM | POA: Diagnosis not present

## 2018-07-27 DIAGNOSIS — Z79899 Other long term (current) drug therapy: Secondary | ICD-10-CM | POA: Diagnosis not present

## 2018-07-27 DIAGNOSIS — I8511 Secondary esophageal varices with bleeding: Secondary | ICD-10-CM | POA: Diagnosis not present

## 2018-07-27 DIAGNOSIS — Z23 Encounter for immunization: Secondary | ICD-10-CM | POA: Insufficient documentation

## 2018-07-27 DIAGNOSIS — I81 Portal vein thrombosis: Secondary | ICD-10-CM | POA: Diagnosis not present

## 2018-07-27 DIAGNOSIS — R188 Other ascites: Secondary | ICD-10-CM

## 2018-07-27 DIAGNOSIS — F17210 Nicotine dependence, cigarettes, uncomplicated: Secondary | ICD-10-CM | POA: Insufficient documentation

## 2018-07-27 DIAGNOSIS — E876 Hypokalemia: Secondary | ICD-10-CM | POA: Diagnosis not present

## 2018-07-27 HISTORY — PX: ESOPHAGOGASTRODUODENOSCOPY (EGD) WITH PROPOFOL: SHX5813

## 2018-07-27 HISTORY — PX: ESOPHAGEAL BANDING: SHX5518

## 2018-07-27 SURGERY — ESOPHAGOGASTRODUODENOSCOPY (EGD) WITH PROPOFOL
Anesthesia: Monitor Anesthesia Care

## 2018-07-27 MED ORDER — LIDOCAINE 2% (20 MG/ML) 5 ML SYRINGE
INTRAMUSCULAR | Status: DC | PRN
  Administered 2018-07-27: 100 mg via INTRAVENOUS

## 2018-07-27 MED ORDER — LACTATED RINGERS IV SOLN
INTRAVENOUS | Status: DC
  Administered 2018-07-27: 08:00:00 via INTRAVENOUS

## 2018-07-27 MED ORDER — SODIUM CHLORIDE 0.9 % IV SOLN: INTRAVENOUS | Status: DC

## 2018-07-27 MED ORDER — ACETAMINOPHEN 500 MG PO TABS: 1000.0000 mg | ORAL_TABLET | Freq: Two times a day (BID) | ORAL | 0 refills | Status: DC | PRN

## 2018-07-27 MED ORDER — PROPOFOL 10 MG/ML IV BOLUS
INTRAVENOUS | Status: DC | PRN
  Administered 2018-07-27: 175 ug/kg/min via INTRAVENOUS

## 2018-07-27 MED ORDER — PROPOFOL 10 MG/ML IV BOLUS
INTRAVENOUS | Status: DC | PRN
  Administered 2018-07-27: 40 mg via INTRAVENOUS
  Administered 2018-07-27: 60 mg via INTRAVENOUS
  Administered 2018-07-27 (×2): 20 mg via INTRAVENOUS

## 2018-07-27 SURGICAL SUPPLY — 14 items

## 2018-07-27 NOTE — Anesthesia Postprocedure Evaluation (Signed)
Anesthesia Post Note  Patient: Katie Holland  Procedure(s) Performed: ESOPHAGOGASTRODUODENOSCOPY (EGD) WITH PROPOFOL (N/A ) ESOPHAGEAL BANDING     Patient location during evaluation: PACU Anesthesia Type: MAC Level of consciousness: awake and alert Pain management: pain level controlled Vital Signs Assessment: post-procedure vital signs reviewed and stable Respiratory status: spontaneous breathing, nonlabored ventilation and respiratory function stable Cardiovascular status: blood pressure returned to baseline and stable Postop Assessment: no apparent nausea or vomiting Anesthetic complications: no    Last Vitals:  Vitals:   07/27/18 0930 07/27/18 0941  BP: (!) 151/90 (!) 157/99  Pulse: (!) 101 91  Resp: (!) 29 14  Temp:    SpO2: 98% 100%    Last Pain:  Vitals:   07/27/18 0941  TempSrc:   PainSc: 0-No pain                 Lidia Collum

## 2018-07-27 NOTE — Transfer of Care (Signed)
Immediate Anesthesia Transfer of Care Note  Patient: Katie Holland  Procedure(s) Performed: ESOPHAGOGASTRODUODENOSCOPY (EGD) WITH PROPOFOL (N/A ) ESOPHAGEAL BANDING  Patient Location: PACU and Endoscopy Unit  Anesthesia Type:MAC  Level of Consciousness: awake, alert  and oriented  Airway & Oxygen Therapy: Patient Spontanous Breathing and Patient connected to nasal cannula oxygen  Post-op Assessment: Report given to RN and Post -op Vital signs reviewed and stable  Post vital signs: Reviewed and stable  Last Vitals:  Vitals Value Taken Time  BP 126/89 07/27/2018  9:20 AM  Temp    Pulse 109 07/27/2018  9:21 AM  Resp 26 07/27/2018  9:21 AM  SpO2 100 % 07/27/2018  9:21 AM  Vitals shown include unvalidated device data.  Last Pain:  Vitals:   07/27/18 0920  TempSrc:   PainSc: 0-No pain         Complications: No apparent anesthesia complications

## 2018-07-27 NOTE — Telephone Encounter (Signed)
You have been scheduled for an abdominal paracentesis at [facility] radiology (1st floor of hospital) on 07/30/18  at 10 am. Please arrive at least 15 minutes prior to your appointment time for registration. Should you need to reschedule this appointment for any reason, please call our office at [phone].  The patient has been notified of this information and all questions answered.

## 2018-07-27 NOTE — Telephone Encounter (Signed)
-----   Message from Yetta Flock, MD sent at 07/27/2018  2:31 PM EST ----- Regarding: paracentesis Rylan Kaufmann this patient needs a large volume paracentesis. Can you help schedule this? If more than 5 L taken she needs albumin infusion. Thanks

## 2018-07-27 NOTE — Op Note (Signed)
[facility] Patient Name: Katie Holland Procedure Date: 07/27/2018 MRN: [id number] Attending MD: Carlota Raspberry. Havery Moros , MD Date of Birth: [birth year] CSN: [id number] Age: 53 Admit Type: Outpatient Procedure:                Upper GI endoscopy Indications:              For therapy of esophageal varices - total of 19                            bands placed in recent months for banding of                            varices, history fo bleeding Providers:                Carlota Raspberry. Havery Moros, MD, Jeanella Cara,                            RN, Elspeth Cho Tech., Technician, Stephanie                            [place] CRNA Referring MD:              Medicines:                Monitored Anesthesia Care Complications:            No immediate complications. Estimated blood loss:                            Minimal. Estimated Blood Loss:     Estimated blood loss was minimal. Procedure:                Pre-Anesthesia Assessment:                           - Prior to the procedure, a History and Physical                            was performed, and patient medications and                            allergies were reviewed. The patient's tolerance of                            previous anesthesia was also reviewed. The risks                            and benefits of the procedure and the sedation                            options and risks were discussed with the patient.                            All questions were answered, and informed consent  was obtained. Prior Anticoagulants: The patient has                            taken no previous anticoagulant or antiplatelet                            agents. ASA Grade Assessment: III - A patient with                            severe systemic disease. After reviewing the risks                            and benefits, the patient was deemed in                            satisfactory condition to  undergo the procedure.                           After obtaining informed consent, the endoscope was                            passed under direct vision. Throughout the                            procedure, the patient's blood pressure, pulse, and                            oxygen saturations were monitored continuously. The                            GIF-H190 ([id number] Olympus gastroscope was                            introduced through the mouth, and advanced to the                            second part of duodenum. The upper GI endoscopy was                            accomplished without difficulty. The patient                            tolerated the procedure well. Scope In: Scope Out: Findings:      The Z-line was regular and was found 33 cm from the incisors.      Varices were found in the lower third of the esophagus. 2 columns that       were medium in size, overall significantly improved since the last few       exams. Stigmata of prior banding was noted in the mid to distal       esophagus. Three bands were successfully placed in the lower esophagus       resulting in deflation of varices.      The exam of the esophagus was otherwise normal.      The entire examined  stomach was normal.      The duodenal bulb and second portion of the duodenum were normal. Impression:               - Z-line regular, 33 cm from the incisors.                           - Esophageal varices in the lower esophagus -                            improved in appearance compared to previous and                            multiple areas of scarring noted - 2 columns noted                            in the distal esophagus s/p banding x 3 today with                            good result.                           - Normal stomach.                           - Normal duodenal bulb and second portion of the                            duodenum. Moderate Sedation:      No moderate sedation, case  performed with MAC Recommendation:           - Patient has a contact number available for                            emergencies. The signs and symptoms of potential                            delayed complications were discussed with the                            patient. Return to normal activities tomorrow.                            Written discharge instructions were provided to the                            patient.                           - Liquid diet now, then advance slowly over the                            next day or so.                           - Continue present medications.                           -  Repeat upper endoscopy in approximately 1 month                            for retreatment if needed.                           - Patient requesting large volume paracentesis for                            recurrent ascites, will have office coordinate this                            to be done in near future Procedure Code(s):        --- Professional ---                           [phone], Esophagogastroduodenoscopy, flexible,                            transoral; with band ligation of esophageal/gastric                            varices Diagnosis Code(s):        --- Professional ---                           I85.00, Esophageal varices without bleeding CPT copyright 2018 American Medical Association. All rights reserved. The codes documented in this report are preliminary and upon coder review may  be revised to meet current compliance requirements. Remo Lipps P. Armbruster, MD 07/27/2018 9:19:27 AM This report has been signed electronically. Number of Addenda: 0

## 2018-07-27 NOTE — Anesthesia Preprocedure Evaluation (Signed)
Anesthesia Evaluation  Patient identified by MRN, date of birth, ID band Patient awake    Reviewed: Allergy & Precautions, NPO status , Patient's Chart, lab work & pertinent test results  History of Anesthesia Complications Negative for: history of anesthetic complications  Airway Mallampati: I  TM Distance: >3 FB Neck ROM: Full    Dental no notable dental hx. (+) Teeth Intact   Pulmonary Current Smoker,    Pulmonary exam normal        Cardiovascular negative cardio ROS Normal cardiovascular exam     Neuro/Psych negative neurological ROS  negative psych ROS   GI/Hepatic GERD  ,(+) Cirrhosis   Esophageal Varices and ascites  substance abuse  alcohol use,   Endo/Other  negative endocrine ROS  Renal/GU negative Renal ROS  negative genitourinary   Musculoskeletal negative musculoskeletal ROS (+)   Abdominal   Peds  Hematology  (+) anemia ,   Anesthesia Other Findings 53 yo F for EGD - PMH: current smoker, cirrhosis w/ esophageal varices, EtOH abuse, GERD  Reproductive/Obstetrics                            Anesthesia Physical Anesthesia Plan  ASA: III  Anesthesia Plan: MAC   Post-op Pain Management:    Induction:   PONV Risk Score and Plan: 1 and Propofol infusion and Treatment may vary due to age or medical condition  Airway Management Planned: Nasal Cannula and Simple Face Mask  Additional Equipment: None  Intra-op Plan:   Post-operative Plan:   Informed Consent: I have reviewed the patients History and Physical, chart, labs and discussed the procedure including the risks, benefits and alternatives for the proposed anesthesia with the patient or authorized representative who has indicated his/her understanding and acceptance.       Plan Discussed with:   Anesthesia Plan Comments:        Anesthesia Quick Evaluation

## 2018-07-27 NOTE — Discharge Instructions (Signed)
Upper Endoscopy, Adult, Care After °This sheet gives you information about how to care for yourself after your procedure. Your health care provider may also give you more specific instructions. If you have problems or questions, contact your health care provider. °What can I expect after the procedure? °After the procedure, it is common to have: °· A sore throat. °· Mild stomach pain or discomfort. °· Bloating. °· Nausea. °Follow these instructions at home: ° °· Follow instructions from your health care provider about what to eat or drink after your procedure. °· Return to your normal activities as told by your health care provider. Ask your health care provider what activities are safe for you. °· Take over-the-counter and prescription medicines only as told by your health care provider. °· Do not drive for 24 hours if you were given a sedative during your procedure. °· Keep all follow-up visits as told by your health care provider. This is important. °Contact a health care provider if you have: °· A sore throat that lasts longer than one day. °· Trouble swallowing. °Get help right away if: °· You vomit blood or your vomit looks like coffee grounds. °· You have: °? A fever. °? Bloody, black, or tarry stools. °? A severe sore throat or you cannot swallow. °? Difficulty breathing. °? Severe pain in your chest or abdomen. °Summary °· After the procedure, it is common to have a sore throat, mild stomach discomfort, bloating, and nausea. °· Do not drive for 24 hours if you were given a sedative during the procedure. °· Follow instructions from your health care provider about what to eat or drink after your procedure. °· Return to your normal activities as told by your health care provider. °This information is not intended to replace advice given to you by your health care provider. Make sure you discuss any questions you have with your health care provider. °Document Released: 12/09/2011 Document Revised: 11/09/2017  Document Reviewed: 11/09/2017 °Elsevier Interactive Patient Education © 2019 [employer] Inc. ° °

## 2018-07-27 NOTE — Interval H&P Note (Signed)
History and Physical Interval Note: Patient underwent repeat EGD with banding on 1/15, patient did well without problems. Further bands placed. Here for repeat EGD with additional banding as needed. Patient reports feeling well without any recent changes. Continues medications. She wishes to proceed after discussion of risks / benefits. Labs stable  07/27/2018 8:07 AM  Katie Holland  has presented today for surgery, with the diagnosis of esophageal varices/jmh  The various methods of treatment have been discussed with the patient and family. After consideration of risks, benefits and other options for treatment, the patient has consented to  Procedure(s): ESOPHAGOGASTRODUODENOSCOPY (EGD) WITH PROPOFOL (N/A) as a surgical intervention .  The patient's history has been reviewed, patient examined, no change in status, stable for surgery.  I have reviewed the patient's chart and labs.  Questions were answered to the patient's satisfaction.     [place]

## 2018-07-28 ENCOUNTER — Other Ambulatory Visit: Payer: Self-pay

## 2018-07-28 ENCOUNTER — Telehealth: Payer: Self-pay

## 2018-07-28 ENCOUNTER — Encounter (HOSPITAL_COMMUNITY): Payer: Self-pay | Admitting: Gastroenterology

## 2018-07-28 DIAGNOSIS — K7460 Unspecified cirrhosis of liver: Secondary | ICD-10-CM

## 2018-07-28 DIAGNOSIS — K7031 Alcoholic cirrhosis of liver with ascites: Secondary | ICD-10-CM

## 2018-07-28 DIAGNOSIS — I8500 Esophageal varices without bleeding: Secondary | ICD-10-CM

## 2018-07-28 DIAGNOSIS — K7290 Hepatic failure, unspecified without coma: Secondary | ICD-10-CM

## 2018-07-28 NOTE — Telephone Encounter (Signed)
Called and spoke to pt.  Scheduled her for EGD at [facility] on 3-9 at 7:30am to arrive at 6:00am.  Pt understands she will need transportation.  Mailing pt instructions as she just had the same procedure on 2-4.  Pt understands to call with any questions.

## 2018-07-28 NOTE — Progress Notes (Signed)
EGD at [facility] on 3-9 for banding of varices

## 2018-07-28 NOTE — Telephone Encounter (Signed)
-----   Message from Yetta Flock, MD sent at 07/27/2018  9:22 AM EST ----- Regarding: varices banding in March Hey Jan can you schedule this patient for an EGD at the hospital for our March schedule, banding of varices? She also is in need of a large volume paracentesis with albumin if > 5 L removed if you can help coordinate. If nursing needs to help with that, Chong Sicilian is covering me this week. Thanks!

## 2018-07-30 ENCOUNTER — Ambulatory Visit (HOSPITAL_COMMUNITY): Admission: RE | Admit: 2018-07-30 | Payer: Medicare Other | Source: Ambulatory Visit

## 2018-08-05 ENCOUNTER — Emergency Department (HOSPITAL_COMMUNITY)
Admission: EM | Admit: 2018-08-05 | Discharge: 2018-08-05 | Disposition: A | Discharge status: Home / Self Care | Payer: Medicare Other | Attending: Emergency Medicine | Admitting: Emergency Medicine

## 2018-08-05 ENCOUNTER — Emergency Department (HOSPITAL_COMMUNITY): Payer: Medicare Other

## 2018-08-05 ENCOUNTER — Encounter (HOSPITAL_COMMUNITY): Payer: Self-pay | Admitting: *Deleted

## 2018-08-05 DIAGNOSIS — Z91040 Latex allergy status: Secondary | ICD-10-CM | POA: Insufficient documentation

## 2018-08-05 DIAGNOSIS — R188 Other ascites: Secondary | ICD-10-CM | POA: Diagnosis not present

## 2018-08-05 DIAGNOSIS — R1084 Generalized abdominal pain: Secondary | ICD-10-CM | POA: Diagnosis present

## 2018-08-05 DIAGNOSIS — K7031 Alcoholic cirrhosis of liver with ascites: Secondary | ICD-10-CM | POA: Diagnosis not present

## 2018-08-05 DIAGNOSIS — Z8719 Personal history of other diseases of the digestive system: Secondary | ICD-10-CM | POA: Diagnosis not present

## 2018-08-05 DIAGNOSIS — Z79899 Other long term (current) drug therapy: Secondary | ICD-10-CM | POA: Insufficient documentation

## 2018-08-05 HISTORY — PX: IR PARACENTESIS: IMG2679

## 2018-08-05 LAB — LIPASE, BLOOD: Lipase: 19 U/L (ref 11–51)

## 2018-08-05 LAB — COMPREHENSIVE METABOLIC PANEL
ALT: 13 U/L (ref 0–44)
AST: 43 U/L — ABNORMAL HIGH (ref 15–41)
Albumin: 2.8 g/dL — ABNORMAL LOW (ref 3.5–5.0)
Alkaline Phosphatase: 134 U/L — ABNORMAL HIGH (ref 38–126)
Anion gap: 9 (ref 5–15)
BUN: 5 mg/dL — ABNORMAL LOW (ref 6–20)
CO2: 22 mmol/L (ref 22–32)
Calcium: 8.9 mg/dL (ref 8.9–10.3)
Chloride: 108 mmol/L (ref 98–111)
Creatinine, Ser: 0.7 mg/dL (ref 0.44–1.00)
GFR calc Af Amer: >60 mL/min (ref >60)
GFR calc non Af Amer: >60 mL/min (ref >60)
Glucose, Bld: 87 mg/dL (ref 70–99)
Potassium: 3.4 mmol/L — ABNORMAL LOW (ref 3.5–5.1)
Sodium: 139 mmol/L (ref 135–145)
Total Bilirubin: 4.4 mg/dL — ABNORMAL HIGH (ref 0.3–1.2)
Total Protein: 6.4 g/dL — ABNORMAL LOW (ref 6.5–8.1)

## 2018-08-05 LAB — PROTIME-INR
INR: 1.45
Prothrombin Time: 17.5 seconds — ABNORMAL HIGH (ref 11.4–15.2)

## 2018-08-05 LAB — CBC
HCT: 33.5 % — ABNORMAL LOW (ref 36.0–46.0)
Hemoglobin: 10.8 g/dL — ABNORMAL LOW (ref 12.0–15.0)
MCH: 27.4 pg (ref 26.0–34.0)
MCHC: 32.2 g/dL (ref 30.0–36.0)
MCV: 85 fL (ref 80.0–100.0)
Platelets: 94 10*3/uL — ABNORMAL LOW (ref 150–400)
RBC: 3.94 MIL/uL (ref 3.87–5.11)
RDW: 21.2 % — ABNORMAL HIGH (ref 11.5–15.5)
WBC: 4.5 10*3/uL (ref 4.0–10.5)
nRBC: 0 % (ref 0.0–0.2)

## 2018-08-05 LAB — I-STAT BETA HCG BLOOD, ED (MC, WL, AP ONLY): I-stat hCG, quantitative: <5 m[IU]/mL (ref <5)

## 2018-08-05 MED ORDER — LIDOCAINE HCL 1 % IJ SOLN
INTRAMUSCULAR | Status: AC
  Filled 2018-08-05: qty 20

## 2018-08-05 MED ORDER — SODIUM CHLORIDE 0.9% FLUSH: 3.0000 mL | Freq: Once | INTRAVENOUS | Status: DC

## 2018-08-05 NOTE — ED Notes (Signed)
Patient verbalizes understanding of discharge instructions. Opportunity for questioning and answers were provided. 

## 2018-08-05 NOTE — Procedures (Signed)
PROCEDURE SUMMARY:  Successful image-guided paracentesis from the right lower abdomen.  Yielded 7.6 liters of clear yellow fluid.  No immediate complications.  EBL: zero Patient tolerated well.   Specimen was not sent for labs.  Please see imaging section of Epic for full dictation.  Joaquim Nam PA-C 08/05/2018 4:19 PM

## 2018-08-05 NOTE — ED Notes (Signed)
Pt returned from IR.

## 2018-08-05 NOTE — ED Notes (Signed)
Pt transported to IR 

## 2018-08-05 NOTE — ED Provider Notes (Signed)
[place] EMERGENCY DEPARTMENT Provider Note   CSN: [id number] Arrival date & time: 08/05/18  1011     History   Chief Complaint Chief Complaint  Patient presents with  . Abdominal Pain    HPI Katie Holland is a 53 y.o. female.  Pt presents to the ED today with abdominal pain from ascites.  The pt has a hx of cirrhosis from alcohol abuse.  She has had to have paracentesis in the past.  She is followed by Dr. Havery Moros [facility]) who scheduled her for one, but she missed it.  Pt's friend said she went to pick pt up for the appointment and pt was intoxicated.  Pt said she feels like her stomach is going to explode.  The pt had banding on her esophageal varices on 2/4.  No complications from that.     Past Medical History:  Diagnosis Date  . Alcoholic liver disease ([place])   . Chronic back pain    on disability  . Cirrhosis of liver ([place])   . Esophageal varices ([place])   . GERD (gastroesophageal reflux disease)   . Jaundice     Patient Active Problem List   Diagnosis Date Noted  . Acute upper GI bleeding 06/02/2018  . Alcohol dependence syndrome ([place]) 06/02/2018  . Decompensated hepatic cirrhosis ([place])   . Esophageal varices ([place])   . Alcoholic liver disease ([place])   . Protein-calorie malnutrition, severe 04/26/2018  . Bleeding esophageal varices ([place])   . Abdominal pain 04/24/2018  . Hematemesis 04/24/2018  . Hematochezia 04/24/2018  . Alcohol abuse 04/24/2018  . Hyponatremia 04/24/2018  . Hypokalemia 04/24/2018  . Metabolic acidosis, normal anion gap (NAG) 04/24/2018  . History of GI bleed 10/26/2017  . Alcoholic cirrhosis of liver with ascites ([place]) 10/26/2017  . Tobacco dependence 10/26/2017    Past Surgical History:  Procedure Laterality Date  . BACK SURGERY    . ESOPHAGEAL BANDING  04/25/2018   Procedure: ESOPHAGEAL BANDING;  Surgeon: Yetta Flock, MD;  Location: [facility] ENDOSCOPY;  Service: Gastroenterology;;  . ESOPHAGEAL BANDING   04/27/2018   Procedure: ESOPHAGEAL BANDING;  Surgeon: Milus Banister, MD;  Location: [place];  Service: Endoscopy;;  . ESOPHAGEAL BANDING  06/02/2018   Procedure: ESOPHAGEAL BANDING;  Surgeon: Doran Stabler, MD;  Location: [place];  Service: Gastroenterology;;  . ESOPHAGEAL BANDING N/A 07/07/2018   Procedure: ESOPHAGEAL BANDING;  Surgeon: Yetta Flock, MD;  [facility];  Service: Gastroenterology;  Laterality: N/A;  . ESOPHAGEAL BANDING  07/27/2018   Procedure: ESOPHAGEAL BANDING;  Surgeon: Yetta Flock, MD;  [facility] ENDOSCOPY;  Service: Gastroenterology;;  . ESOPHAGOGASTRODUODENOSCOPY N/A 04/27/2018   Procedure: ESOPHAGOGASTRODUODENOSCOPY (EGD);  Surgeon: Milus Banister, MD;  Location[facility] END[facility]COPY;  Service: Endoscopy;  Laterality: N/A;  . ESOPHAGOGASTRODUODENOSCOPY (EGD) WITH PROPOFOL N/A 04/25/2018   Procedure: ESOPHAGOGASTRODUODENOSCOPY (EGD) WITH PROPOFOL;  Surgeon: Yetta Flock, MD;  Location: [place];  Service: Gastroenterology;  Laterality: N/A;  . ESOPHAGOGASTRODUODENOSCOPY (EGD) WITH PROPOFOL N/A 06/02/2018   Procedure: ESOPHAGOGASTRODUODENOSCOPY (EGD) WITH PROPOFOL;  Surgeon: Doran Stabler, MD;  Location: [place];  Service: Gastroenterology;  Laterality: N/A;  . ESOPHAGOGASTRODUODENOSCOPY (EGD) WITH PROPOFOL N/A 07/07/2018   Procedure: ESOPHAGOGASTRODUODENOSCOPY (EGD) WITH PROPOFOL;  Surgeon: Yetta Flock, MD;  [facility];  Service: Gastroenterology;  Laterality: N/A;  . ESOPHAGOGASTRODUODENOSCOPY (EGD) WITH PROPOFOL N/A 07/27/2018   Procedure: ESOPHAGOGASTRODUODENOSCOPY (EGD) WITH PROPOFOL;  Surgeon: Yetta Flock, MD;  [facility] ENDOS[facility];  Service:  Gastroenterology;  Laterality: N/A;  . IR PARACENTESIS  04/26/2018  . IR PARACENTESIS  05/18/2018  . IR PARACENTESIS  05/31/2018  . IR PARACENTESIS  06/30/2018     OB History   No obstetric history on file.      Home Medications     Prior to Admission medications   Medication Sig Start Date End Date Taking? Authorizing Provider  acetaminophen (TYLENOL) 500 MG tablet Take 2 tablets (1,000 mg total) by mouth every 12 (twelve) hours as needed for moderate pain or headache. 07/27/18  Yes Armbruster, Carlota Raspberry, MD  ferrous sulfate 325 (65 FE) MG EC tablet Take 1 tablet (325 mg total) by mouth 2 (two) times daily. 06/11/18 06/11/19 Yes Billie Ruddy, MD  folic acid (FOLVITE) 1 MG tablet Take 1 tablet (1 mg total) by mouth daily. 06/11/18  Yes Billie Ruddy, MD  furosemide (LASIX) 20 MG tablet Take 2 tablets (40 mg total) by mouth 2 (two) times daily. Patient taking differently: Take 60 mg by mouth daily.  06/29/18  Yes Armbruster, Carlota Raspberry, MD  Lactulose 20 GM/30ML SOLN Take 45 mLs (30 g total) by mouth 3 (three) times daily. Take 30 mls every eight hours. Titrate for 3 bm per 24 hrs Patient taking differently: Take 20 g by mouth 3 (three) times daily.  05/27/18  Yes Esterwood, Amy S, PA-C  Multiple Vitamin (MULTIVITAMIN WITH MINERALS) TABS tablet Take 1 tablet by mouth daily. 06/11/18  Yes Billie Ruddy, MD  potassium chloride (K-DUR) 10 MEQ tablet Take 1 tablet (10 mEq total) by mouth daily. 05/03/18  Yes Elgergawy, Silver Huguenin, MD  predniSONE (DELTASONE) 5 MG tablet Take 40 mg by mouth daily with breakfast.   Yes [provider]  pantoprazole (PROTONIX) 40 MG tablet Take 1 tablet (40 mg total) by mouth 2 (two) times daily. Patient not taking: Reported on 07/05/2018 06/06/18   Jonetta Osgood, MD  rifaximin (XIFAXAN) 550 MG TABS tablet Take 1 tablet (550 mg total) by mouth 2 (two) times daily. Patient not taking: Reported on 07/05/2018 05/27/18   Esterwood, Amy S, PA-C  spironolactone (ALDACTONE) 100 MG tablet Take 2 tablets (200 mg total) by mouth daily before breakfast. Patient not taking: Reported on 08/05/2018 06/29/18 07/29/18  Armbruster, Carlota Raspberry, MD    Family History Family History  Problem Relation Age of  Onset  . Colon cancer Neg Hx   . Esophageal cancer Neg Hx     Social History Social History   Tobacco Use  . Smoking status: Current Every Day Smoker    Packs/day: 0.75    Years: 20.00    Pack years: 15.00    Types: Cigarettes  . Smokeless tobacco: Never Used  Substance Use Topics  . Alcohol use: Not Currently    Comment: denies h/o withdrawal, quit 05/23/2018  . Drug use: Not Currently     Allergies   Contrast media [iodinated diagnostic agents]; Latex; and Sulfa antibiotics   Review of Systems Review of Systems  Gastrointestinal: Positive for abdominal distention.  All other systems reviewed and are negative.    Physical Exam Updated Vital Signs BP (!) 147/100   Pulse (!) 115   Temp 98 F (36.7 C) (Oral)   Resp 20   SpO2 98%   Physical Exam Vitals signs and nursing note reviewed.  Constitutional:      Appearance: She is well-developed.  HENT:     Head: Normocephalic and atraumatic.  Eyes:     Extraocular Movements:  Extraocular movements intact.  Cardiovascular:     Rate and Rhythm: Normal rate and regular rhythm.  Pulmonary:     Effort: Pulmonary effort is normal.     Breath sounds: Normal breath sounds.  Abdominal:     Palpations: There is fluid wave.     Tenderness: There is generalized abdominal tenderness.     Comments: Massive ascites  Skin:    General: Skin is warm and dry.     Capillary Refill: Capillary refill takes less than 2 seconds.  Neurological:     General: No focal deficit present.     Mental Status: She is alert and oriented to person, place, and time.  Psychiatric:        Mood and Affect: Mood normal.        Behavior: Behavior normal.      ED Treatments / Results  Labs (all labs ordered are listed, but only abnormal results are displayed) Labs Reviewed  COMPREHENSIVE METABOLIC PANEL - Abnormal; Notable for the following components:      Result Value   Potassium 3.4 (*)    BUN 5 (*)    Total Protein 6.4 (*)    Albumin  2.8 (*)    AST 43 (*)    Alkaline Phosphatase 134 (*)    Total Bilirubin 4.4 (*)    All other components within normal limits  CBC - Abnormal; Notable for the following components:   Hemoglobin 10.8 (*)    HCT 33.5 (*)    RDW 21.2 (*)    Platelets 94 (*)    All other components within normal limits  PROTIME-INR - Abnormal; Notable for the following components:   Prothrombin Time 17.5 (*)    All other components within normal limits  LIPASE, BLOOD  URINALYSIS, ROUTINE W REFLEX MICROSCOPIC  I-STAT BETA HCG BLOOD, ED ([facility], [facility], [facility] ONLY)    EKG None  Radiology No results found.  Procedures Procedures (including critical care time)  Medications Ordered in ED Medications  sodium chloride flush (NS) 0.9 % injection 3 mL (has no administration in time range)     Initial Impression / Assessment and Plan / ED Course  I have reviewed the triage vital signs and the nursing notes.  Pertinent labs & imaging results that were available during my care of the patient were reviewed by me and considered in my medical decision making (see chart for details).   IR is able to do a paracentesis today.  This is pending at shift change.  Pt will be able to go home after the paracentesis.  Final Clinical Impressions(s) / ED Diagnoses   Final diagnoses:  Ascites  Alcoholic cirrhosis of liver with ascites [facility])    ED Discharge Orders    None       Isla Pence, MD 08/05/18 1501

## 2018-08-05 NOTE — ED Triage Notes (Signed)
Pt in c/o abd distention with recent paracentesis d/t liver failure, pt last ETOH in the beginning of December, denies n/v/d, pt A&O x4

## 2018-08-11 ENCOUNTER — Other Ambulatory Visit: Payer: Self-pay

## 2018-08-12 ENCOUNTER — Other Ambulatory Visit: Payer: Self-pay

## 2018-08-12 NOTE — Patient Outreach (Signed)
[place] [facility]) Care Management  08/11/2018  Katie Holland [birth year] [id number]   Referral Date: 08/11/2018 Referral Source: UM referral Referral Reason: Transportation assistance, ? Medication assistance   Outreach Attempt: No answer.  HIPAA compliant voice message left.   Plan: RN CM will attempt again within 4 business days and send letter.     Jone Baseman, RN, MSN [facility] Care Management Care Management Coordinator Direct Line [phone] Toll Free: [phone]  Fax: [phone]

## 2018-08-12 NOTE — Patient Outreach (Addendum)
[place] [facility]) Care Management  08/12/2018  Katie Holland 10-15-1965 [id number]   Referral Date: 08/11/2018 Referral Source: UM referral Referral Reason: Transportation assistance, ? Medication assistance   Outreach Attempt: No answer.  HIPAA compliant voice message left.   Plan: RN CM will attempt again within 4 business days.     Jone Baseman, RN, MSN [facility] Care Management Care Management Coordinator Direct Line [phone] Toll Free: [phone]  Fax: [phone]

## 2018-08-13 ENCOUNTER — Other Ambulatory Visit: Payer: Self-pay

## 2018-08-13 NOTE — Patient Outreach (Signed)
[place] [facility]) Care Management  08/13/2018  Katie Holland 09/19/1965 [id number]   Referral Date: 08/11/2018 Referral Source: UM referral Referral Reason: Transportation assistance, ? Medication assistance   Outreach Attempt: No answer.  HIPAA compliant voice message left.   Plan: RN CM will wait return call.  If no return call will close case.  Jone Baseman, RN, MSN [place] Management Care Management Coordinator Direct Line [phone] Cell [phone] Toll Free: [phone]  Fax: [phone]

## 2018-08-16 ENCOUNTER — Ambulatory Visit: Payer: Self-pay

## 2018-08-25 ENCOUNTER — Other Ambulatory Visit: Payer: Self-pay

## 2018-08-25 NOTE — Patient Outreach (Signed)
[place] [facility]) Care Management  08/25/2018  Sherrise Liberto 1966/04/07 [id number]   Multiple attempts to establish contact with patient without success. No response from letter mailed to patient.   Plan: RN CM will close case at this time.   Jone Baseman, RN, MSN [place] Management Care Management Coordinator Direct Line [phone] Cell [phone] Toll Free: [phone]  Fax: [phone]

## 2018-08-26 ENCOUNTER — Encounter (HOSPITAL_COMMUNITY): Payer: Self-pay | Admitting: *Deleted

## 2018-08-29 NOTE — Anesthesia Preprocedure Evaluation (Addendum)
Anesthesia Evaluation  Patient identified by MRN, date of birth, ID band Patient awake    Reviewed: Allergy & Precautions, NPO status , Patient's Chart, lab work & pertinent test results  Airway Mallampati: II  TM Distance: >3 FB Neck ROM: Full    Dental no notable dental hx. (+) Teeth Intact   Pulmonary Current Smoker,    Pulmonary exam normal breath sounds clear to auscultation       Cardiovascular Normal cardiovascular exam Rhythm:Regular Rate:Normal  06/04/18 EKG SR R 95   Neuro/Psych negative neurological ROS     GI/Hepatic GERD  Medicated,(+) Cirrhosis   Esophageal Varices and ascites  substance abuse  alcohol use,   Endo/Other    Renal/GU      Musculoskeletal   Abdominal   Peds  Hematology Hgb 10.8 Plts 94   Anesthesia Other Findings   Reproductive/Obstetrics                           Anesthesia Physical Anesthesia Plan  ASA: III  Anesthesia Plan: MAC   Post-op Pain Management:    Induction: Intravenous  PONV Risk Score and Plan: Treatment may vary due to age or medical condition  Airway Management Planned: Nasal Cannula and Natural Airway  Additional Equipment:   Intra-op Plan:   Post-operative Plan:   Informed Consent: I have reviewed the patients History and Physical, chart, labs and discussed the procedure including the risks, benefits and alternatives for the proposed anesthesia with the patient or authorized representative who has indicated his/her understanding and acceptance.     Dental advisory given  Plan Discussed with: CRNA  Anesthesia Plan Comments:         Anesthesia Quick Evaluation

## 2018-08-30 ENCOUNTER — Other Ambulatory Visit: Payer: Self-pay

## 2018-08-30 ENCOUNTER — Ambulatory Visit (HOSPITAL_COMMUNITY): Payer: Medicare Other | Admitting: Anesthesiology

## 2018-08-30 ENCOUNTER — Ambulatory Visit (HOSPITAL_COMMUNITY)
Admission: RE | Admit: 2018-08-30 | Discharge: 2018-08-30 | Disposition: A | Discharge status: Home / Self Care | Payer: Medicare Other | Attending: Gastroenterology | Admitting: Gastroenterology

## 2018-08-30 ENCOUNTER — Encounter (HOSPITAL_COMMUNITY)
Admission: RE | Disposition: A | Discharge status: Home / Self Care | Payer: Self-pay | Source: Home / Self Care | Attending: Gastroenterology

## 2018-08-30 ENCOUNTER — Encounter (HOSPITAL_COMMUNITY): Payer: Self-pay | Admitting: *Deleted

## 2018-08-30 ENCOUNTER — Telehealth: Payer: Self-pay

## 2018-08-30 DIAGNOSIS — K709 Alcoholic liver disease, unspecified: Secondary | ICD-10-CM | POA: Diagnosis not present

## 2018-08-30 DIAGNOSIS — G8929 Other chronic pain: Secondary | ICD-10-CM | POA: Diagnosis not present

## 2018-08-30 DIAGNOSIS — K7290 Hepatic failure, unspecified without coma: Secondary | ICD-10-CM

## 2018-08-30 DIAGNOSIS — K7031 Alcoholic cirrhosis of liver with ascites: Secondary | ICD-10-CM

## 2018-08-30 DIAGNOSIS — I8500 Esophageal varices without bleeding: Secondary | ICD-10-CM

## 2018-08-30 DIAGNOSIS — I8510 Secondary esophageal varices without bleeding: Secondary | ICD-10-CM | POA: Diagnosis not present

## 2018-08-30 DIAGNOSIS — Z79899 Other long term (current) drug therapy: Secondary | ICD-10-CM | POA: Diagnosis not present

## 2018-08-30 DIAGNOSIS — K7460 Unspecified cirrhosis of liver: Secondary | ICD-10-CM

## 2018-08-30 DIAGNOSIS — F17210 Nicotine dependence, cigarettes, uncomplicated: Secondary | ICD-10-CM | POA: Insufficient documentation

## 2018-08-30 DIAGNOSIS — E876 Hypokalemia: Secondary | ICD-10-CM | POA: Diagnosis not present

## 2018-08-30 DIAGNOSIS — K219 Gastro-esophageal reflux disease without esophagitis: Secondary | ICD-10-CM | POA: Diagnosis not present

## 2018-08-30 DIAGNOSIS — E871 Hypo-osmolality and hyponatremia: Secondary | ICD-10-CM | POA: Diagnosis not present

## 2018-08-30 HISTORY — PX: ESOPHAGOGASTRODUODENOSCOPY (EGD) WITH PROPOFOL: SHX5813

## 2018-08-30 HISTORY — PX: ESOPHAGEAL BANDING: SHX5518

## 2018-08-30 SURGERY — ESOPHAGOGASTRODUODENOSCOPY (EGD) WITH PROPOFOL
Anesthesia: Monitor Anesthesia Care

## 2018-08-30 MED ORDER — LACTATED RINGERS IV SOLN
INTRAVENOUS | Status: DC
  Administered 2018-08-30: 1000 mL via INTRAVENOUS

## 2018-08-30 MED ORDER — PROPOFOL 10 MG/ML IV BOLUS
INTRAVENOUS | Status: DC | PRN
  Administered 2018-08-30 (×2): 20 mg via INTRAVENOUS
  Administered 2018-08-30: 10 mg via INTRAVENOUS
  Administered 2018-08-30: 20 mg via INTRAVENOUS
  Administered 2018-08-30: 10 mg via INTRAVENOUS
  Administered 2018-08-30: 20 mg via INTRAVENOUS

## 2018-08-30 MED ORDER — FENTANYL CITRATE (PF) 100 MCG/2ML IJ SOLN
INTRAMUSCULAR | Status: AC
  Filled 2018-08-30: qty 2

## 2018-08-30 MED ORDER — PROPOFOL 500 MG/50ML IV EMUL
INTRAVENOUS | Status: DC | PRN
  Administered 2018-08-30: 150 ug/kg/min via INTRAVENOUS

## 2018-08-30 MED ORDER — PROPOFOL 10 MG/ML IV BOLUS
INTRAVENOUS | Status: AC
  Filled 2018-08-30: qty 40

## 2018-08-30 MED ORDER — OXYCODONE-ACETAMINOPHEN 10-325 MG PO TABS: 1.0000 | ORAL_TABLET | Freq: Four times a day (QID) | ORAL | 0 refills | Status: DC | PRN

## 2018-08-30 MED ORDER — ONDANSETRON HCL 4 MG/2ML IJ SOLN
4.0000 mg | Freq: Once | INTRAMUSCULAR | Status: AC
  Administered 2018-08-30: 4 mg via INTRAVENOUS

## 2018-08-30 MED ORDER — FENTANYL CITRATE (PF) 100 MCG/2ML IJ SOLN
25.0000 ug | Freq: Once | INTRAMUSCULAR | Status: AC
  Administered 2018-08-30: 25 ug via INTRAVENOUS

## 2018-08-30 MED ORDER — SODIUM CHLORIDE 0.9 % IV SOLN: INTRAVENOUS | Status: DC

## 2018-08-30 MED ORDER — ONDANSETRON HCL 4 MG/2ML IJ SOLN
INTRAMUSCULAR | Status: AC
  Filled 2018-08-30: qty 2

## 2018-08-30 SURGICAL SUPPLY — 14 items

## 2018-08-30 NOTE — Discharge Instructions (Signed)
YOU HAD AN ENDOSCOPIC PROCEDURE TODAY: Refer to the procedure report and other information in the discharge instructions given to you for any specific questions about what was found during the examination. If this information does not answer your questions, please call [place] office at [phone] to clarify.  ° °YOU SHOULD EXPECT: Some feelings of bloating in the abdomen. Passage of more gas than usual. Walking can help get rid of the air that was put into your GI tract during the procedure and reduce the bloating. If you had a lower endoscopy (such as a colonoscopy or flexible sigmoidoscopy) you may notice spotting of blood in your stool or on the toilet paper. Some abdominal soreness may be present for a day or two, also. ° °DIET: Your first meal following the procedure should be a light meal and then it is ok to progress to your normal diet. A half-sandwich or bowl of soup is an example of a good first meal. Heavy or fried foods are harder to digest and may make you feel nauseous or bloated. Drink plenty of fluids but you should avoid alcoholic beverages for 24 hours. If you had a esophageal dilation, please see attached instructions for diet.   ° °ACTIVITY: Your care partner should take you home directly after the procedure. You should plan to take it easy, moving slowly for the rest of the day. You can resume normal activity the day after the procedure however YOU SHOULD NOT DRIVE, use power tools, machinery or perform tasks that involve climbing or major physical exertion for 24 hours (because of the sedation medicines used during the test).  ° °SYMPTOMS TO REPORT IMMEDIATELY: °A gastroenterologist can be reached at any hour. Please call [phone]  for any of the following symptoms:  °Following lower endoscopy (colonoscopy, flexible sigmoidoscopy) °Excessive amounts of blood in the stool  °Significant tenderness, worsening of abdominal pains  °Swelling of the abdomen that is new, acute  °Fever of 100° or  higher  °Following upper endoscopy (EGD, EUS, ERCP, esophageal dilation) °Vomiting of blood or coffee ground material  °New, significant abdominal pain  °New, significant chest pain or pain under the shoulder blades  °Painful or persistently difficult swallowing  °New shortness of breath  °Black, tarry-looking or red, bloody stools ° °FOLLOW UP:  °If any biopsies were taken you will be contacted by phone or by letter within the next 1-3 weeks. Call [phone]  if you have not heard about the biopsies in 3 weeks.  °Please also call with any specific questions about appointments or follow up tests. ° °

## 2018-08-30 NOTE — Telephone Encounter (Signed)
Scheduled EGD at [facility] [place] on 09-28-2018 for 8:30am procedure. To arrive at 7:00am. Pt needs to be called and instructions mailed.

## 2018-08-30 NOTE — H&P (Signed)
HPI:   Katie Holland is a 53 y.o. female with a history of alcoholic cirrhosis with variceal bleeding in the past, here for EGD with additional banding if needed. Total of 22 bands placed in recent months to eradicate varices. Last exam 1 month ago. She reports doing okay since that time. Not on propranolol due to refractory ascites.  Past Medical History:  Diagnosis Date  . Alcoholic liver disease ([place])   . Chronic back pain    on disability  . Cirrhosis of liver ([place])   . Esophageal varices ([place])   . GERD (gastroesophageal reflux disease)   . Jaundice     Past Surgical History:  Procedure Laterality Date  . BACK SURGERY    . ESOPHAGEAL BANDING  04/25/2018   Procedure: ESOPHAGEAL BANDING;  Surgeon: Yetta Flock, MD;  Location[facility] ENDOSCOPY;  Service: Gastroenterology;;  . ESOPHAGEAL BANDING  04/27/2018   Procedure: ESOPHAGEAL BANDING;  Surgeon: Milus Banister, MD;  Location: [place];  Service: Endoscopy;;  . ESOPHAGEAL BANDING  06/02/2018   Procedure: ESOPHAGEAL BANDING;  Surgeon: Doran Stabler, MD;  Location: [place];  Service: Gastroenterology;;  . ESOPHAGEAL BANDING N/A 07/07/2018   Procedure: ESOPHAGEAL BANDING;  Surgeon: Yetta Flock, MD;  [facility] ENDOSCOPY;  Service: Gastroenterology;  Laterality: N/A;  . ESOPHAGEAL BANDING  07/27/2018   Procedure: ESOPHAGEAL BANDING;  Surgeon: Yetta Flock, MD;  [facility]OSCOPY;  Service: Gastroenterology;;  . ESOPHAGOGASTRODUODENOSCOPY N/A 04/27/2018   Procedure: ESOPHAGOGASTRODUODENOSCOPY (EGD);  Surgeon: Milus Banister, MD;  Location: [facility] ENDOSCOPY;  Service: Endoscopy;  Laterality: N/A;  . ESOPHAGOGASTRODUODENOSCOPY (EGD) WITH PROPOFOL N/A 04/25/2018   Procedure: ESOPHAGOGASTRODUODENOSCOPY (EGD) WITH PROPOFOL;  Surgeon: Yetta Flock, MD;  Location: [place];  Service: Gastroenterology;  Laterality: N/A;  . ESOPHAGOGASTRODUODENOSCOPY (EGD) WITH PROPOFOL N/A 06/02/2018     Procedure: ESOPHAGOGASTRODUODENOSCOPY (EGD) WITH PROPOFOL;  Surgeon: Doran Stabler, MD;  Location: [place];  Service: Gastroenterology;  Laterality: N/A;  . ESOPHAGOGASTRODUODENOSCOPY (EGD) WITH PROPOFOL N/A 07/07/2018   Procedure: ESOPHAGOGASTRODUODENOSCOPY (EGD) WITH PROPOFOL;  Surgeon: Yetta Flock, MD;  [facility];  Service: Gastroenterology;  Laterality: N/A;  . ESOPHAGOGASTRODUODENOSCOPY (EGD) WITH PROPOFOL N/A 07/27/2018   Procedure: ESOPHAGOGASTRODUODENOSCOPY (EGD) WITH PROPOFOL;  Surgeon: Yetta Flock, MD;  [facility] ENDOS[facility];  Service: Gastroenterology;  Laterality: N/A;  . IR PARACENTESIS  04/26/2018  . IR PARACENTESIS  05/18/2018  . IR PARACENTESIS  05/31/2018  . IR PARACENTESIS  06/30/2018  . IR PARACENTESIS  08/05/2018    Family History  Problem Relation Age of Onset  . Colon cancer Neg Hx   . Esophageal cancer Neg Hx      Social History   Tobacco Use  . Smoking status: Current Every Day Smoker    Packs/day: 0.75    Years: 20.00    Pack years: 15.00    Types: Cigarettes  . Smokeless tobacco: Never Used  Substance Use Topics  . Alcohol use: Not Currently    Comment: denies h/o withdrawal, quit 05/23/2018  . Drug use: Not Currently    Prior to Admission medications   Medication Sig Start Date End Date Taking? Authorizing Provider  acetaminophen (TYLENOL) 500 MG tablet Take 2 tablets (1,000 mg total) by mouth every 12 (twelve) hours as needed for moderate pain or headache. Patient taking differently: Take 500 mg by mouth every 12 (twelve) hours as needed for moderate pain or headache.  07/27/18  Yes Geraldyne Barraclough, Carlota Raspberry, MD  ferrous sulfate 325 (65 FE) MG EC tablet Take 1 tablet (325 mg total) by mouth 2 (two) times daily. Patient taking differently: Take 325 mg by mouth daily with breakfast.  06/11/18 06/11/19 Yes Billie Ruddy, MD  folic acid (FOLVITE) 1 MG tablet Take 1 tablet (1 mg total) by mouth daily. 06/11/18  Yes Billie Ruddy, MD  furosemide (LASIX) 20 MG tablet Take 2 tablets (40 mg total) by mouth 2 (two) times daily. 06/29/18  Yes Shaton Lore, Carlota Raspberry, MD  Lactulose 20 GM/30ML SOLN Take 45 mLs (30 g total) by mouth 3 (three) times daily. Take 30 mls every eight hours. Titrate for 3 bm per 24 hrs Patient taking differently: Take 20 g by mouth 3 (three) times daily.  05/27/18  Yes Esterwood, Amy S, PA-C  Multiple Vitamin (MULTIVITAMIN WITH MINERALS) TABS tablet Take 1 tablet by mouth daily. 06/11/18  Yes Billie Ruddy, MD  rifaximin (XIFAXAN) 550 MG TABS tablet Take 1 tablet (550 mg total) by mouth 2 (two) times daily. 05/27/18  Yes Esterwood, Amy S, PA-C  pantoprazole (PROTONIX) 40 MG tablet Take 1 tablet (40 mg total) by mouth 2 (two) times daily. Patient not taking: Reported on 07/05/2018 06/06/18   Jonetta Osgood, MD  potassium chloride (K-DUR) 10 MEQ tablet Take 1 tablet (10 mEq total) by mouth daily. Patient not taking: Reported on 08/25/2018 05/03/18   Elgergawy, Silver Huguenin, MD  spironolactone (ALDACTONE) 100 MG tablet Take 2 tablets (200 mg total) by mouth daily before breakfast. Patient not taking: Reported on 08/05/2018 06/29/18 07/29/18  Olegario Emberson, Carlota Raspberry, MD    Current Facility-Administered Medications  Medication Dose Route Frequency Provider Last Rate Last Dose  . 0.9 %  sodium chloride infusion   Intravenous Continuous Jory Tanguma, Carlota Raspberry, MD      . lactated ringers infusion   Intravenous Continuous Havery Moros Carlota Raspberry, MD 10 mL/hr at 08/30/18 0719 1,000 mL at 08/30/18 0719    Allergies as of 07/27/2018 - Review Complete 07/27/2018  Allergen Reaction Noted  . Contrast media [iodinated diagnostic agents] Itching and Swelling 10/26/2017  . Latex Itching, Swelling, and Other (See Comments) 10/26/2017  . Sulfa antibiotics Itching, Swelling, and Other (See Comments) 10/26/2017     Review of Systems:    As per HPI, otherwise negative    Physical Exam:  Vital signs in last 24  hours: Temp:  [99.5 F (37.5 C)] 99.5 F (37.5 C) (03/09 0703) Pulse Rate:  [104] 104 (03/09 0703) Resp:  [20] 20 (03/09 0703) BP: (149)/(93) 149/93 (03/09 0703) SpO2:  [100 %] 100 % (03/09 0703) Weight:  [74.8 kg] 74.8 kg (03/09 0703)   General:   Pleasant female in NAD Head:  Normocephalic and atraumatic. Eyes:   No icterus.   Conjunctiva pink, icteris noted. Ears:  Normal auditory acuity. Neck:  Supple Lungs:  Respirations even and unlabored.   Heart:  Regular rate and rhythm;  Abdomen:  Soft, distended with ascites, nontender.  No appreciable masses or hepatomegaly.   Lab Results  Component Value Date   WBC 4.5 08/05/2018   HGB 10.8 (L) 08/05/2018   HCT 33.5 (L) 08/05/2018   MCV 85.0 08/05/2018   PLT 94 (L) 08/05/2018    Lab Results  Component Value Date   CREATININE 0.70 08/05/2018   BUN 5 (L) 08/05/2018   NA 139 08/05/2018   K 3.4 (L) 08/05/2018   CL 108 08/05/2018   CO2 22  08/05/2018    Lab Results  Component Value Date   INR 1.45 08/05/2018   INR 1.6 (H) 07/02/2018   INR 1.6 (H) 06/14/2018   Lab Results  Component Value Date   ALT 13 08/05/2018   AST 43 (H) 08/05/2018   ALKPHOS 134 (H) 08/05/2018   BILITOT 4.4 (H) 08/05/2018      Impression / Plan:   53 y/o female with alcoholic cirrhosis and history of variceal bleeding s/p banding as above, as well as ascites. Here for repeat EGD with additional banding of varices if needed. I have discussed risks /  Benefits of EGD and banding with her, she wishes to proceed. Further recommendations pending the results.   Mazeppa Cellar, MD [facility] Gastroenterology

## 2018-08-30 NOTE — Op Note (Signed)
[facility] Patient Name: Katie Holland Procedure Date: 08/30/2018 MRN: [id number] Attending MD: Carlota Raspberry. Havery Moros , MD Date of Birth: [birth year] CSN: [id number] Age: 53 Admit Type: Outpatient Procedure:                Upper GI endoscopy Indications:              For therapy of esophageal varices, history of                            variceal bleeding, now s/p 22 bands in the past 3                            months. Here for repeat endoscopy for treatment Providers:                Remo Lipps P. Havery Moros, MD, Jeanella Cara,                            RN, William Dalton, Technician Referring MD:              Medicines:                Monitored Anesthesia Care Complications:            No immediate complications. Estimated blood loss:                            Minimal. Estimated Blood Loss:     Estimated blood loss was minimal. Procedure:                Pre-Anesthesia Assessment:                           - Prior to the procedure, a History and Physical                            was performed, and patient medications and                            allergies were reviewed. The patient's tolerance of                            previous anesthesia was also reviewed. The risks                            and benefits of the procedure and the sedation                            options and risks were discussed with the patient.                            All questions were answered, and informed consent                            was obtained. Prior Anticoagulants: The patient has  taken no previous anticoagulant or antiplatelet                            agents. ASA Grade Assessment: III - A patient with                            severe systemic disease. After reviewing the risks                            and benefits, the patient was deemed in                            satisfactory condition to undergo the procedure.         After obtaining informed consent, the endoscope was                            passed under direct vision. Throughout the                            procedure, the patient's blood pressure, pulse, and                            oxygen saturations were monitored continuously. The                            GIF-H190 ([id number]) Olympus gastroscope was                            introduced through the mouth, and advanced to the                            body of the stomach. The upper GI endoscopy was                            accomplished without difficulty. The patient                            tolerated the procedure well. Scope In: Scope Out: Findings:      The Z-line was regular and was found 33 cm from the incisors.      Varices were found in the lower third of the esophagus. Overall appear       significantly improved since therapy started from initial endoscopy.       Scarring noted in most of the distal and mid esophagus. There was       evidence of residual varix in the distal esophagus just proximal to the       GEJ, with red marking noted. 2 bands were placed above the GEJ. Another       residual varix was noted in the mid esophagus and one band placed. Three       bands in total were successfully placed resulting in deflation of       varices.      The exam of the esophagus was otherwise normal.      The gastric fundus and gastric body were normal.  Impression:               - Z-line regular, 33 cm from the incisors.                           - Esophageal varices as outlined above. Banded x 3                            - overall significant improvement with therapy thus                            far.                           - Normal gastric fundus and gastric body.                           - Complete EGD not performed given normal stomach                            and duodenum on last few endoscopies. Moderate Sedation:      No moderate sedation, case performed  with MAC Recommendation:           - Liquid diet today then advance slowly as                            tolerated.                           - Continue present medications.                           - Repeat upper endoscopy in 1 month for                            retreatment. If next exam is normal, will then                            lengthen surveillance                           - Patient due for routine labs (CBC, CMET)                           - Large volume paracentesis will be ordered given                            accumulation of ascites despite diuretics Procedure Code(s):        --- Professional ---                           35573, 52, Esophagogastroduodenoscopy, flexible,                            transoral; with band ligation of esophageal/gastric  varices Diagnosis Code(s):        --- Professional ---                           I85.00, Esophageal varices without bleeding CPT copyright 2018 American Medical Association. All rights reserved. The codes documented in this report are preliminary and upon coder review may  be revised to meet current compliance requirements. Remo Lipps P. Lewayne Pauley, MD 08/30/2018 7:46:38 AM This report has been signed electronically. Number of Addenda: 0

## 2018-08-30 NOTE — Transfer of Care (Signed)
Immediate Anesthesia Transfer of Care Note  Patient: Katie Holland  Procedure(s) Performed: ESOPHAGOGASTRODUODENOSCOPY (EGD) WITH PROPOFOL (N/A ) ESOPHAGEAL BANDING  Patient Location: PACU  Anesthesia Type:MAC  Level of Consciousness: sedated  Airway & Oxygen Therapy: Patient Spontanous Breathing and Patient connected to nasal cannula oxygen  Post-op Assessment: Report given to RN and Post -op Vital signs reviewed and stable  Post vital signs: Reviewed and stable  Last Vitals:  Vitals Value Taken Time  BP    Temp    Pulse 121 08/30/2018  7:41 AM  Resp 18 08/30/2018  7:41 AM  SpO2 99 % 08/30/2018  7:41 AM  Vitals shown include unvalidated device data.  Last Pain:  Vitals:   08/30/18 0703  TempSrc: Oral  PainSc: 0-No pain         Complications: No apparent anesthesia complications

## 2018-08-30 NOTE — Interval H&P Note (Signed)
History and Physical Interval Note:  08/30/2018 7:23 AM  Katie Holland  has presented today for surgery, with the diagnosis of alcholic cirrhosis with esophageal varices.  The various methods of treatment have been discussed with the patient and family. After consideration of risks, benefits and other options for treatment, the patient has consented to  Procedure(s): ESOPHAGOGASTRODUODENOSCOPY (EGD) WITH PROPOFOL (N/A) as a surgical intervention.  The patient's history has been reviewed, patient examined, no change in status, stable for surgery.  I have reviewed the patient's chart and labs.  Questions were answered to the patient's satisfaction.     [place]

## 2018-08-30 NOTE — Anesthesia Postprocedure Evaluation (Signed)
Anesthesia Post Note  Patient: Katie Holland  Procedure(s) Performed: ESOPHAGOGASTRODUODENOSCOPY (EGD) WITH PROPOFOL (N/A ) ESOPHAGEAL BANDING     Patient location during evaluation: Endoscopy Anesthesia Type: MAC Level of consciousness: awake and alert Pain management: pain level controlled Vital Signs Assessment: post-procedure vital signs reviewed and stable Respiratory status: spontaneous breathing, nonlabored ventilation, respiratory function stable and patient connected to nasal cannula oxygen Cardiovascular status: blood pressure returned to baseline and stable Postop Assessment: no apparent nausea or vomiting Anesthetic complications: no    Last Vitals:  Vitals:   08/30/18 0703 08/30/18 0742  BP: (!) 149/93 (!) 153/103  Pulse: (!) 104 (!) 121  Resp: 20 (!) 21  Temp: 37.5 C   SpO2: 100% 100%    Last Pain:  Vitals:   08/30/18 0742  TempSrc:   PainSc: 10-Worst pain ever                 Barnet [place]

## 2018-08-30 NOTE — Progress Notes (Signed)
EGD with banding of varices at [facility] on 4-7

## 2018-08-31 ENCOUNTER — Ambulatory Visit (HOSPITAL_COMMUNITY)
Admission: RE | Admit: 2018-08-31 | Discharge: 2018-08-31 | Disposition: A | Discharge status: Home / Self Care | Payer: Medicare Other | Source: Ambulatory Visit | Attending: Gastroenterology | Admitting: Gastroenterology

## 2018-08-31 ENCOUNTER — Encounter (HOSPITAL_COMMUNITY): Payer: Self-pay | Admitting: Student

## 2018-08-31 DIAGNOSIS — R188 Other ascites: Secondary | ICD-10-CM | POA: Diagnosis not present

## 2018-08-31 DIAGNOSIS — K7031 Alcoholic cirrhosis of liver with ascites: Secondary | ICD-10-CM

## 2018-08-31 HISTORY — PX: IR PARACENTESIS: IMG2679

## 2018-08-31 MED ORDER — ALBUMIN HUMAN 25 % IV SOLN
50.0000 g | Freq: Once | INTRAVENOUS | Status: AC
  Administered 2018-08-31: 50 g via INTRAVENOUS

## 2018-08-31 MED ORDER — LIDOCAINE HCL 1 % IJ SOLN
INTRAMUSCULAR | Status: AC | PRN
  Administered 2018-08-31: 10 mL

## 2018-08-31 MED ORDER — LIDOCAINE HCL 1 % IJ SOLN
INTRAMUSCULAR | Status: AC
  Filled 2018-08-31: qty 20

## 2018-08-31 MED ORDER — ALBUMIN HUMAN 25 % IV SOLN
INTRAVENOUS | Status: AC
  Administered 2018-08-31: 50 g via INTRAVENOUS
  Filled 2018-08-31: qty 200

## 2018-08-31 NOTE — Procedures (Signed)
PROCEDURE SUMMARY:  Successful US guided paracentesis from right lateral abdomen.  Yielded 8.9 liters of yellow fluid.  No immediate complications.  Pt tolerated well.   Specimen was not sent for labs.  EBL < 34mL  Docia Barrier PA-C 08/31/2018 12:35 PM

## 2018-09-01 NOTE — Telephone Encounter (Signed)
Called and spoke to pt.  She confirmed procedure on 4-7 at [place] at 8:30am to arrive at 7:00am. Mailing instructions to pt. No previsit needed. Pt not DM, not on BT

## 2018-09-22 ENCOUNTER — Telehealth: Payer: Self-pay

## 2018-09-22 NOTE — Telephone Encounter (Signed)
Thank you Sherlynn Stalls. I agree with your recommendations, she can call as needed for paracentesis.

## 2018-09-22 NOTE — Telephone Encounter (Signed)
Spoke with pt's daughter and notified her we were cancelling her mother's EGD for 09/28/18 and would be rescheduling after COVID-19 restrictions had passed. Daughter was concerned about her mother's IR Paracentesis that she gets done about once a month. States her mother is comfortable right now. I asked her to call us when her mother is uncomfortable and feels she needs it.

## 2018-09-22 NOTE — Telephone Encounter (Signed)
Left message for pt's daughter to call back.

## 2018-09-28 ENCOUNTER — Encounter (HOSPITAL_COMMUNITY): Admission: RE | Payer: Self-pay | Source: Home / Self Care

## 2018-09-28 ENCOUNTER — Ambulatory Visit (HOSPITAL_COMMUNITY): Admission: RE | Admit: 2018-09-28 | Payer: Medicare Other | Source: Home / Self Care | Admitting: Gastroenterology

## 2018-09-28 SURGERY — ESOPHAGOGASTRODUODENOSCOPY (EGD) WITH PROPOFOL
Anesthesia: Monitor Anesthesia Care

## 2018-10-06 ENCOUNTER — Telehealth: Payer: Self-pay | Admitting: Gastroenterology

## 2018-10-06 NOTE — Telephone Encounter (Signed)
Patient called would like to know if we can schedule a paresthesias for her

## 2018-10-07 ENCOUNTER — Telehealth: Payer: Self-pay

## 2018-10-07 ENCOUNTER — Other Ambulatory Visit: Payer: Self-pay

## 2018-10-07 DIAGNOSIS — R188 Other ascites: Secondary | ICD-10-CM

## 2018-10-07 NOTE — Telephone Encounter (Signed)
-----   Message from Yetta Flock, MD sent at 10/05/2018  6:16 PM EDT ----- Yes that is fine, can take as much as they can get and give albumin if > 5 L.  She needs a CBC, CMET if you can ask her to go to the lab. I would also like to see her in a clinic visit if you can help coordinate virtual visit with me, I have openings for next week. Thanks ----- Message ----- From: Hughie Closs, RN Sent: 10/05/2018  11:00 AM EDT To: Yetta Flock, MD  Patient called in requesting a Paracentesis. Please advise

## 2018-10-07 NOTE — Telephone Encounter (Signed)
Order for IR paracentesis and Labs in [place]. Virtual visit scheduled for 10/11/18 at 2pm. Patient called and made aware of above. Also called daughter, per patient's request and gave her info.

## 2018-10-08 ENCOUNTER — Other Ambulatory Visit (INDEPENDENT_AMBULATORY_CARE_PROVIDER_SITE_OTHER): Payer: Medicare Other

## 2018-10-08 ENCOUNTER — Ambulatory Visit (HOSPITAL_COMMUNITY)
Admission: RE | Admit: 2018-10-08 | Discharge: 2018-10-08 | Disposition: A | Discharge status: Home / Self Care | Payer: Medicare Other | Source: Ambulatory Visit | Attending: Gastroenterology | Admitting: Gastroenterology

## 2018-10-08 ENCOUNTER — Encounter (HOSPITAL_COMMUNITY): Payer: Self-pay | Admitting: Student

## 2018-10-08 ENCOUNTER — Other Ambulatory Visit: Payer: Self-pay

## 2018-10-08 DIAGNOSIS — R188 Other ascites: Secondary | ICD-10-CM | POA: Insufficient documentation

## 2018-10-08 HISTORY — PX: IR PARACENTESIS: IMG2679

## 2018-10-08 LAB — CBC WITH DIFFERENTIAL/PLATELET
Basophils Absolute: 0 10*3/uL (ref 0.0–0.1)
Basophils Relative: 0.8 % (ref 0.0–3.0)
Eosinophils Absolute: 0.2 10*3/uL (ref 0.0–0.7)
Eosinophils Relative: 4.8 % (ref 0.0–5.0)
HCT: 35.5 % — ABNORMAL LOW (ref 36.0–46.0)
Hemoglobin: 11.8 g/dL — ABNORMAL LOW (ref 12.0–15.0)
Lymphocytes Relative: 45.7 % (ref 12.0–46.0)
Lymphs Abs: 2.3 10*3/uL (ref 0.7–4.0)
MCHC: 33.3 g/dL (ref 30.0–36.0)
MCV: 85.1 fl (ref 78.0–100.0)
Monocytes Absolute: 0.6 10*3/uL (ref 0.1–1.0)
Monocytes Relative: 12.2 % — ABNORMAL HIGH (ref 3.0–12.0)
Neutro Abs: 1.8 10*3/uL (ref 1.4–7.7)
Neutrophils Relative %: 36.5 % — ABNORMAL LOW (ref 43.0–77.0)
Platelets: 82 10*3/uL — ABNORMAL LOW (ref 150.0–400.0)
RBC: 4.17 Mil/uL (ref 3.87–5.11)
RDW: 21.6 % — ABNORMAL HIGH (ref 11.5–15.5)
WBC: 5 10*3/uL (ref 4.0–10.5)

## 2018-10-08 LAB — COMPREHENSIVE METABOLIC PANEL
ALT: 11 U/L (ref 0–35)
AST: 37 U/L (ref 0–37)
Albumin: 2.9 g/dL — ABNORMAL LOW (ref 3.5–5.2)
Alkaline Phosphatase: 174 U/L — ABNORMAL HIGH (ref 39–117)
BUN: 6 mg/dL (ref 6–23)
CO2: 24 mEq/L (ref 19–32)
Calcium: 8.8 mg/dL (ref 8.4–10.5)
Chloride: 103 mEq/L (ref 96–112)
Creatinine, Ser: 0.59 mg/dL (ref 0.40–1.20)
GFR: 128.64 mL/min (ref >60.00)
Glucose, Bld: 90 mg/dL (ref 70–99)
Potassium: 3.2 mEq/L — ABNORMAL LOW (ref 3.5–5.1)
Sodium: 137 mEq/L (ref 135–145)
Total Bilirubin: 2.4 mg/dL — ABNORMAL HIGH (ref 0.2–1.2)
Total Protein: 6.9 g/dL (ref 6.0–8.3)

## 2018-10-08 MED ORDER — LIDOCAINE HCL 1 % IJ SOLN
INTRAMUSCULAR | Status: DC | PRN
  Administered 2018-10-08: 10 mL

## 2018-10-08 MED ORDER — LIDOCAINE HCL 1 % IJ SOLN
INTRAMUSCULAR | Status: AC
  Filled 2018-10-08: qty 20

## 2018-10-08 MED ORDER — ALBUMIN HUMAN 25 % IV SOLN
INTRAVENOUS | Status: AC
  Administered 2018-10-08: 75 g
  Filled 2018-10-08: qty 300

## 2018-10-08 NOTE — Procedures (Signed)
PROCEDURE SUMMARY:  Successful US guided paracentesis from right lateral abdomen.  Yielded 9.1 liters of yellow fluid.  No immediate complications.  Pt tolerated well.   Specimen was not sent for labs.  EBL < 18mL  Docia Barrier PA-C 10/08/2018 2:31 PM

## 2018-10-11 ENCOUNTER — Ambulatory Visit (INDEPENDENT_AMBULATORY_CARE_PROVIDER_SITE_OTHER): Payer: Medicare Other | Admitting: Gastroenterology

## 2018-10-11 ENCOUNTER — Other Ambulatory Visit: Payer: Self-pay

## 2018-10-11 VITALS — Ht 64.0 in | Wt 160.0 lb

## 2018-10-11 DIAGNOSIS — K7031 Alcoholic cirrhosis of liver with ascites: Secondary | ICD-10-CM

## 2018-10-11 DIAGNOSIS — I8501 Esophageal varices with bleeding: Secondary | ICD-10-CM | POA: Diagnosis not present

## 2018-10-11 DIAGNOSIS — K7290 Hepatic failure, unspecified without coma: Secondary | ICD-10-CM

## 2018-10-11 DIAGNOSIS — K7682 Hepatic encephalopathy: Secondary | ICD-10-CM

## 2018-10-11 MED ORDER — FUROSEMIDE 20 MG PO TABS: 40.0000 mg | ORAL_TABLET | Freq: Two times a day (BID) | ORAL | 3 refills | Status: DC

## 2018-10-11 MED ORDER — POTASSIUM CHLORIDE ER 10 MEQ PO TBCR: 10.0000 meq | EXTENDED_RELEASE_TABLET | Freq: Every day | ORAL | 5 refills | Status: DC

## 2018-10-11 MED ORDER — SPIRONOLACTONE 100 MG PO TABS: 200.0000 mg | ORAL_TABLET | Freq: Every day | ORAL | 3 refills | Status: DC

## 2018-10-11 NOTE — Patient Instructions (Addendum)
If you are age 54 or older, your body mass index should be between 23-30. Your Body mass index is 27.46 kg/m. If this is out of the aforementioned range listed, please consider follow up with your Primary Care Provider.  If you are age 61 or younger, your body mass index should be between 19-25. Your Body mass index is 27.46 kg/m. If this is out of the aformentioned range listed, please consider follow up with your Primary Care Provider.   To help prevent the possible spread of infection to our patients, communities, and staff; we will be implementing the following measures:  As of now we are not allowing any visitors/family members to accompany you to any upcoming appointments with [facility]. If you have any concerns about this please contact our office to discuss prior to the appointment.   You have been scheduled for an abdominal ultrasound at [facility] Radiology (1st floor of hospital) on Tuesday, 11-23-2018 at 9:30am. Please arrive 15 minutes prior to your appointment for registration. Make certain not to have anything to eat or drink 6 hours prior to your appointment. Should you need to reschedule your appointment, please contact radiology at [phone]. This test typically takes about 30 minutes to perform.  We have sent the following medications to your pharmacy for you to pick up at your convenience: Lasix 40mg : Take two 20mg  tablets twice a day Aldactone 200mg : Take two 100mg  tablets daily Potassium (K-Dur) 84meq: Take daily   You will be due for your 2nd and final Hep A injection in July.  We will call you to schedule that nurse visit as it get closer to that time.   Please go to the lab in the basement of our building the 1st or 2nd week of May for lab work: CBC, CMET, INR/PT and AFP. You do not need an appointment.  They are open Monday thru Friday 8:00am to 4:00pm.     Thank you for entrusting me with your care and for choosing [facility], Dr. [place] Cellar

## 2018-10-11 NOTE — H&P (View-Only) (Signed)
Virtual Visit via Video Note  I connected with Katie Holland on 10/11/18 at  2:00 PM EDT by a video enabled telemedicine application and verified that I am speaking with the correct person using two identifiers.   I discussed the limitations of evaluation and management by telemedicine and the availability of in person appointments. The patient expressed understanding and agreed to proceed.  THIS ENCOUNTER IS A VIRTUAL VISIT DUE TO COVID-19 - PATIENT WAS NOT SEEN IN THE OFFICE. PATIENT HAS CONSENTED TO VIRTUAL VISIT / TELEMEDICINE VISIT USING DOXIMITY APP   Location of patient: home Location of provider: office Persons participating: myself, patient   HPI :  53 y/o female here for a follow up visit. 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 She has had a few admissions for variceal bleeding early in her course this month at the time of her diagnosis. She has had multiple endoscopies since then, a total of 25 bands have been applied to her esophagus over multiple sessions given the large size of her varices. Last exam was done on 08/29/17 at which time 3 bands were placed, however don't think she will need any further therapy. She was scheduled for surveillance earlier this month but it was cancelled due to coronavirus outbreak. She has not been on beta blocker due to difficulty managing ascites.  She has been doing okay. She reports after our last visit she has finally been completely sober without any alcohol for at least the past 6-7 weeks. She has done a good job with this, it took some time to get her to reach sobriety. She has been getting paracentesis periodically, had 9.1L removed last week. She is not thought to be a good candidate for TIPS given history of portal vein thrombosis and high MELD, when last evaluated by IR. She has been on lasix 40mg  BID and aldactone 200mg  / day but she can out  about a week ago and has been off all diuretics. She continues to have difficulty managing ascites. She has been compliant with lactulose and rifaximin and working well to keep encephalopathy controlled. She seems very appropriate with clear mental status today. She received hep A vaccine since her last visit and flu shot. She does not have any complaints otherwise, eating well.   Of note she had a questionable nodular-appearing omentum with an elevated CA-125 level. Ascitic cytology 2 has been negative. She had a CT scan which did not show any problems in the pelvis her ovaries. She has no evidence of HCC.  Most recent endoscopies: EGD 07/07/18 - 4 bands placed EGD 07/27/18 - 3 bands placed EGD 08/30/18 - 3 bands placed     Past Medical History:  Diagnosis Date  . Alcoholic liver disease ([place])   . Chronic back pain    on disability  . Cirrhosis of liver ([place])   . Esophageal varices ([place])   . GERD (gastroesophageal reflux disease)   . Jaundice      Past Surgical History:  Procedure Laterality Date  . BACK SURGERY    . ESOPHAGEAL BANDING  04/25/2018   Procedure: ESOPHAGEAL BANDING;  Surgeon: Yetta Flock, MD;  Location: [facility] ENDOSCOPY;  Service: Gastroenterology;;  . ESOPHAGEAL BANDING  04/27/2018   Procedure: ESOPHAGEAL BANDING;  Surgeon: Milus Banister, MD;  Location: [facility] ENDOSCOPY;  Service: Endoscopy;;  . ESOPHAGEAL BANDING  06/02/2018   Procedure: ESOPHAGEAL BANDING;  Surgeon: Nelida Meuse  III, MD;  Location: [place];  Service: Gastroenterology;;  . ESOPHAGEAL BANDING N/A 07/07/2018   Procedure: ESOPHAGEAL BANDING;  Surgeon: Yetta Flock, MD;  Location: [facility] ENDOSCOPY;  Service: Gastroenterology;  Laterality: N/A;  . ESOPHAGEAL BANDING  07/27/2018   Procedure: ESOPHAGEAL BANDING;  Surgeon: Yetta Flock, MD;  Location: [facility] ENDOSCOPY;  Service: Gastroenterology;;  . ESOPHAGEAL BANDING  08/30/2018   Procedure: ESOPHAGEAL BANDING;  Surgeon: Yetta Flock,  MD;  Location: [facility] ENDOSCOPY;  Service: Gastroenterology;;  . ESOPHAGOGASTRODUODENOSCOPY N/A 04/27/2018   Procedure: ESOPHAGOGASTRODUODENOSCOPY (EGD);  Surgeon: Milus Banister, MD;  Location[facility] ENDOSCOPY;  Service: Endoscopy;  Laterality: N/A;  . ESOPHAGOGASTRODUODENOSCOPY (EGD) WITH PROPOFOL N/A 04/25/2018   Procedure: ESOPHAGOGASTRODUODENOSCOPY (EGD) WITH PROPOFOL;  Surgeon: Yetta Flock, MD;  Location: [place];  Service: Gastroenterology;  Laterality: N/A;  . ESOPHAGOGASTRODUODENOSCOPY (EGD) WITH PROPOFOL N/A 06/02/2018   Procedure: ESOPHAGOGASTRODUODENOSCOPY (EGD) WITH PROPOFOL;  Surgeon: Doran Stabler, MD;  Location: [place];  Service: Gastroenterology;  Laterality: N/A;  . ESOPHAGOGASTRODUODENOSCOPY (EGD) WITH PROPOFOL N/A 07/07/2018   Procedure: ESOPHAGOGASTRODUODENOSCOPY (EGD) WITH PROPOFOL;  Surgeon: Yetta Flock, MD;  Location: [facility] ENDOSCOPY;  Service: Gastroenterology;  Laterality: N/A;  . ESOPHAGOGASTRODUODENOSCOPY (EGD) WITH PROPOFOL N/A 07/27/2018   Procedure: ESOPHAGOGASTRODUODENOSCOPY (EGD) WITH PROPOFOL;  Surgeon: Yetta Flock, MD;  Location: [facility] ENDOSCOPY;  Service: Gastroenterology;  Laterality: N/A;  . ESOPHAGOGASTRODUODENOSCOPY (EGD) WITH PROPOFOL N/A 08/30/2018   Procedure: ESOPHAGOGASTRODUODENOSCOPY (EGD) WITH PROPOFOL;  Surgeon: Yetta Flock, MD;  Location: [facility] ENDOSCOPY;  Service: Gastroenterology;  Laterality: N/A;  . IR PARACENTESIS  04/26/2018  . IR PARACENTESIS  05/18/2018  . IR PARACENTESIS  05/31/2018  . IR PARACENTESIS  06/30/2018  . IR PARACENTESIS  08/05/2018  . IR PARACENTESIS  08/31/2018  . IR PARACENTESIS  10/08/2018   Family History  Problem Relation Age of Onset  . Colon cancer Neg Hx   . Esophageal cancer Neg Hx    Social History   Tobacco Use  . Smoking status: Current Every Day Smoker    Packs/day: 0.75    Years: 20.00    Pack years: 15.00    Types: Cigarettes  . Smokeless tobacco: Never Used  Substance Use  Topics  . Alcohol use: Not Currently    Comment: denies h/o withdrawal, quit 05/23/2018  . Drug use: Not Currently   Current Outpatient Medications  Medication Sig Dispense Refill  . furosemide (LASIX) 20 MG tablet Take 2 tablets (40 mg total) by mouth 2 (two) times daily. 120 tablet 1  . Lactulose 20 GM/30ML SOLN Take 45 mLs (30 g total) by mouth 3 (three) times daily. Take 30 mls every eight hours. Titrate for 3 bm per 24 hrs (Patient taking differently: Take 20 g by mouth 3 (three) times daily. 45 mls three times a day) 1892 mL 6  . potassium chloride (K-DUR) 10 MEQ tablet Take 1 tablet (10 mEq total) by mouth daily. 30 tablet 0  . rifaximin (XIFAXAN) 550 MG TABS tablet Take 1 tablet (550 mg total) by mouth 2 (two) times daily. 60 tablet 11   No current facility-administered medications for this visit.    Allergies  Allergen Reactions  . Contrast Media [Iodinated Diagnostic Agents] Itching and Swelling  . Latex Itching, Swelling and Other (See Comments)    No breathing impairment, however  . Sulfa Antibiotics Itching, Swelling and Other (See Comments)    No breathing impairment, however     Review of Systems: All systems reviewed and negative  except where noted in HPI.    Ir Paracentesis  Result Date: 10/08/2018 INDICATION: Patient with abdominal distention, recurrent ascites. Request is made for therapeutic paracentesis. EXAM: ULTRASOUND GUIDED THERAPEUTIC PARACENTESIS MEDICATIONS: 10 mL 1% lidocaine COMPLICATIONS: None immediate. PROCEDURE: Informed written consent was obtained from the patient after a discussion of the risks, benefits and alternatives to treatment. A timeout was performed prior to the initiation of the procedure. Initial ultrasound scanning demonstrates a large amount of ascites within the right lower abdominal quadrant. The right lower abdomen was prepped and draped in the usual sterile fashion. 1% lidocaine was used for local anesthesia. Following this, a 19  gauge, 7-cm, Yueh catheter was introduced. An ultrasound image was saved for documentation purposes. The paracentesis was performed. The catheter was removed and a dressing was applied. The patient tolerated the procedure well without immediate post procedural complication. FINDINGS: A total of approximately 9.1 liters of yellow fluid was removed. IMPRESSION: Successful ultrasound-guided therapeutic paracentesis yielding 9.1 liters of peritoneal fluid. Read by: Brynda Greathouse PA-C Electronically Signed   By: Markus Daft M.D.   On: 10/08/2018 15:04    Physical Exam: There were no vitals taken for this visit. NA  ASSESSMENT AND PLAN:  53 y/o female here for reassessment of the following issues:  Alcoholic cirrhosis with ascites / esophageal varices with bleeding / hepatic encephalopathy - decompensated but since I have last seen her doing much better. She has been able to be sober for the past 6-8 weeks, which took some time for her to be able to do this successfully, which is great news. We discussed cirrhosis in general, risks for further decompensation and HCC. Fortunately she has responded well to banding without rebleeding, although it has taken 25 bands thus far to treat the varices. Not using beta blocker due to ascites, not a candidate for TIPS given high MELD and portal vein thrombosis. HE seems well controlled. Still needing large volume para PRN. She had not yet been referred to Hepatology due to alcohol use. If she can remain free from alcohol over the upcoming weeks will refer her for transplant evaluation. Otherwise doing well on medication regimen thus far, ran out of diuretics and will refill.   Recommend: - continued alcohol abstinence - repeat EGD at hospital to reassess varices once cleared to resume elective endoscopy, plan for likely late May or sometime in June, I think risk for rebleeding at this point in time much lower than previous given the amount of therapy she has received  and that she has stopped drinking - refilled lasix 40mg  BID and aldactone 200mg  / day, refilled potassium supplementation. Large volume paracentesis as needed - low Na diet - continue lactulose and rifaximin - continued hep A vaccine series when able to come back to the office for this - will schedule for screening colonoscopy if she has not had one yet at the time of her next EGD - repeat labs in a few weeks for reassessment since resumption of diuretics - repeat US with AFP for [facility] screening in June - follow up in the office in 8 weeks or so - plan for referral to hepatology for transplant evaluation if she can remain abstinent from alcohol  [place] Cellar, MD [facility] Gastroenterology

## 2018-10-11 NOTE — Progress Notes (Signed)
Virtual Visit via Video Note  I connected with Tanveer Dobberstein on 10/11/18 at  2:00 PM EDT by a video enabled telemedicine application and verified that I am speaking with the correct person using two identifiers.   I discussed the limitations of evaluation and management by telemedicine and the availability of in person appointments. The patient expressed understanding and agreed to proceed.  THIS ENCOUNTER IS A VIRTUAL VISIT DUE TO COVID-19 - PATIENT WAS NOT SEEN IN THE OFFICE. PATIENT HAS CONSENTED TO VIRTUAL VISIT / TELEMEDICINE VISIT USING DOXIMITY APP   Location of patient: home Location of provider: office Persons participating: myself, patient   HPI :  53 y/o female here for a follow up visit. 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 She has had a few admissions for variceal bleeding early in her course this month at the time of her diagnosis. She has had multiple endoscopies since then, a total of 25 bands have been applied to her esophagus over multiple sessions given the large size of her varices. Last exam was done on 08/29/17 at which time 3 bands were placed, however don't think she will need any further therapy. She was scheduled for surveillance earlier this month but it was cancelled due to coronavirus outbreak. She has not been on beta blocker due to difficulty managing ascites.  She has been doing okay. She reports after our last visit she has finally been completely sober without any alcohol for at least the past 6-7 weeks. She has done a good job with this, it took some time to get her to reach sobriety. She has been getting paracentesis periodically, had 9.1L removed last week. She is not thought to be a good candidate for TIPS given history of portal vein thrombosis and high MELD, when last evaluated by IR. She has been on lasix 40mg  BID and aldactone 200mg  / day but she can out  about a week ago and has been off all diuretics. She continues to have difficulty managing ascites. She has been compliant with lactulose and rifaximin and working well to keep encephalopathy controlled. She seems very appropriate with clear mental status today. She received hep A vaccine since her last visit and flu shot. She does not have any complaints otherwise, eating well.   Of note she had a questionable nodular-appearing omentum with an elevated CA-125 level. Ascitic cytology 2 has been negative. She had a CT scan which did not show any problems in the pelvis her ovaries. She has no evidence of HCC.  Most recent endoscopies: EGD 07/07/18 - 4 bands placed EGD 07/27/18 - 3 bands placed EGD 08/30/18 - 3 bands placed     Past Medical History:  Diagnosis Date  . Alcoholic liver disease ([place])   . Chronic back pain    on disability  . Cirrhosis of liver ([place])   . Esophageal varices ([place])   . GERD (gastroesophageal reflux disease)   . Jaundice      Past Surgical History:  Procedure Laterality Date  . BACK SURGERY    . ESOPHAGEAL BANDING  04/25/2018   Procedure: ESOPHAGEAL BANDING;  Surgeon: Yetta Flock, MD;  Location: [facility] ENDOSCOPY;  Service: Gastroenterology;;  . ESOPHAGEAL BANDING  04/27/2018   Procedure: ESOPHAGEAL BANDING;  Surgeon: Milus Banister, MD;  Location: [facility] ENDOSCOPY;  Service: Endoscopy;;  . ESOPHAGEAL BANDING  06/02/2018   Procedure: ESOPHAGEAL BANDING;  Surgeon: Nelida Meuse  III, MD;  Location: [place];  Service: Gastroenterology;;  . ESOPHAGEAL BANDING N/A 07/07/2018   Procedure: ESOPHAGEAL BANDING;  Surgeon: Yetta Flock, MD;  Location: [facility] ENDOSCOPY;  Service: Gastroenterology;  Laterality: N/A;  . ESOPHAGEAL BANDING  07/27/2018   Procedure: ESOPHAGEAL BANDING;  Surgeon: Yetta Flock, MD;  Location: [facility] ENDOSCOPY;  Service: Gastroenterology;;  . ESOPHAGEAL BANDING  08/30/2018   Procedure: ESOPHAGEAL BANDING;  Surgeon: Yetta Flock,  MD;  Location: [facility] ENDOSCOPY;  Service: Gastroenterology;;  . ESOPHAGOGASTRODUODENOSCOPY N/A 04/27/2018   Procedure: ESOPHAGOGASTRODUODENOSCOPY (EGD);  Surgeon: Milus Banister, MD;  Location: [facility] ENDOSCOPY;  Service: Endoscopy;  Laterality: N/A;  . ESOPHAGOGASTRODUODENOSCOPY (EGD) WITH PROPOFOL N/A 04/25/2018   Procedure: ESOPHAGOGASTRODUODENOSCOPY (EGD) WITH PROPOFOL;  Surgeon: Yetta Flock, MD;  Location: [place];  Service: Gastroenterology;  Laterality: N/A;  . ESOPHAGOGASTRODUODENOSCOPY (EGD) WITH PROPOFOL N/A 06/02/2018   Procedure: ESOPHAGOGASTRODUODENOSCOPY (EGD) WITH PROPOFOL;  Surgeon: Doran Stabler, MD;  Location: [place];  Service: Gastroenterology;  Laterality: N/A;  . ESOPHAGOGASTRODUODENOSCOPY (EGD) WITH PROPOFOL N/A 07/07/2018   Procedure: ESOPHAGOGASTRODUODENOSCOPY (EGD) WITH PROPOFOL;  Surgeon: Yetta Flock, MD;  Location: [facility] ENDOSCOPY;  Service: Gastroenterology;  Laterality: N/A;  . ESOPHAGOGASTRODUODENOSCOPY (EGD) WITH PROPOFOL N/A 07/27/2018   Procedure: ESOPHAGOGASTRODUODENOSCOPY (EGD) WITH PROPOFOL;  Surgeon: Yetta Flock, MD;  Location: [facility] ENDOSCOPY;  Service: Gastroenterology;  Laterality: N/A;  . ESOPHAGOGASTRODUODENOSCOPY (EGD) WITH PROPOFOL N/A 08/30/2018   Procedure: ESOPHAGOGASTRODUODENOSCOPY (EGD) WITH PROPOFOL;  Surgeon: Yetta Flock, MD;  Location: [facility] ENDOSCOPY;  Service: Gastroenterology;  Laterality: N/A;  . IR PARACENTESIS  04/26/2018  . IR PARACENTESIS  05/18/2018  . IR PARACENTESIS  05/31/2018  . IR PARACENTESIS  06/30/2018  . IR PARACENTESIS  08/05/2018  . IR PARACENTESIS  08/31/2018  . IR PARACENTESIS  10/08/2018   Family History  Problem Relation Age of Onset  . Colon cancer Neg Hx   . Esophageal cancer Neg Hx    Social History   Tobacco Use  . Smoking status: Current Every Day Smoker    Packs/day: 0.75    Years: 20.00    Pack years: 15.00    Types: Cigarettes  . Smokeless tobacco: Never Used  Substance Use  Topics  . Alcohol use: Not Currently    Comment: denies h/o withdrawal, quit 05/23/2018  . Drug use: Not Currently   Current Outpatient Medications  Medication Sig Dispense Refill  . furosemide (LASIX) 20 MG tablet Take 2 tablets (40 mg total) by mouth 2 (two) times daily. 120 tablet 1  . Lactulose 20 GM/30ML SOLN Take 45 mLs (30 g total) by mouth 3 (three) times daily. Take 30 mls every eight hours. Titrate for 3 bm per 24 hrs (Patient taking differently: Take 20 g by mouth 3 (three) times daily. 45 mls three times a day) 1892 mL 6  . potassium chloride (K-DUR) 10 MEQ tablet Take 1 tablet (10 mEq total) by mouth daily. 30 tablet 0  . rifaximin (XIFAXAN) 550 MG TABS tablet Take 1 tablet (550 mg total) by mouth 2 (two) times daily. 60 tablet 11   No current facility-administered medications for this visit.    Allergies  Allergen Reactions  . Contrast Media [Iodinated Diagnostic Agents] Itching and Swelling  . Latex Itching, Swelling and Other (See Comments)    No breathing impairment, however  . Sulfa Antibiotics Itching, Swelling and Other (See Comments)    No breathing impairment, however     Review of Systems: All systems reviewed and negative  except where noted in HPI.    Ir Paracentesis  Result Date: 10/08/2018 INDICATION: Patient with abdominal distention, recurrent ascites. Request is made for therapeutic paracentesis. EXAM: ULTRASOUND GUIDED THERAPEUTIC PARACENTESIS MEDICATIONS: 10 mL 1% lidocaine COMPLICATIONS: None immediate. PROCEDURE: Informed written consent was obtained from the patient after a discussion of the risks, benefits and alternatives to treatment. A timeout was performed prior to the initiation of the procedure. Initial ultrasound scanning demonstrates a large amount of ascites within the right lower abdominal quadrant. The right lower abdomen was prepped and draped in the usual sterile fashion. 1% lidocaine was used for local anesthesia. Following this, a 19  gauge, 7-cm, Yueh catheter was introduced. An ultrasound image was saved for documentation purposes. The paracentesis was performed. The catheter was removed and a dressing was applied. The patient tolerated the procedure well without immediate post procedural complication. FINDINGS: A total of approximately 9.1 liters of yellow fluid was removed. IMPRESSION: Successful ultrasound-guided therapeutic paracentesis yielding 9.1 liters of peritoneal fluid. Read by: Brynda Greathouse PA-C Electronically Signed   By: Markus Daft M.D.   On: 10/08/2018 15:04    Physical Exam: There were no vitals taken for this visit. NA  ASSESSMENT AND PLAN:  53 y/o female here for reassessment of the following issues:  Alcoholic cirrhosis with ascites / esophageal varices with bleeding / hepatic encephalopathy - decompensated but since I have last seen her doing much better. She has been able to be sober for the past 6-8 weeks, which took some time for her to be able to do this successfully, which is great news. We discussed cirrhosis in general, risks for further decompensation and HCC. Fortunately she has responded well to banding without rebleeding, although it has taken 25 bands thus far to treat the varices. Not using beta blocker due to ascites, not a candidate for TIPS given high MELD and portal vein thrombosis. HE seems well controlled. Still needing large volume para PRN. She had not yet been referred to Hepatology due to alcohol use. If she can remain free from alcohol over the upcoming weeks will refer her for transplant evaluation. Otherwise doing well on medication regimen thus far, ran out of diuretics and will refill.   Recommend: - continued alcohol abstinence - repeat EGD at hospital to reassess varices once cleared to resume elective endoscopy, plan for likely late May or sometime in June, I think risk for rebleeding at this point in time much lower than previous given the amount of therapy she has received  and that she has stopped drinking - refilled lasix 40mg  BID and aldactone 200mg  / day, refilled potassium supplementation. Large volume paracentesis as needed - low Na diet - continue lactulose and rifaximin - continued hep A vaccine series when able to come back to the office for this - will schedule for screening colonoscopy if she has not had one yet at the time of her next EGD - repeat labs in a few weeks for reassessment since resumption of diuretics - repeat US with AFP for [facility] screening in June - follow up in the office in 8 weeks or so - plan for referral to hepatology for transplant evaluation if she can remain abstinent from alcohol  Balmorhea Cellar, MD [facility] Gastroenterology

## 2018-10-21 ENCOUNTER — Other Ambulatory Visit: Payer: Self-pay

## 2018-10-21 ENCOUNTER — Telehealth: Payer: Self-pay

## 2018-10-21 DIAGNOSIS — I8501 Esophageal varices with bleeding: Secondary | ICD-10-CM

## 2018-10-21 NOTE — Telephone Encounter (Signed)
Scheduling patient per Dr Havery Moros. Cases that were cancelled due to COVID-19. Patient rescheduled for 11/08/18 at 7:30am at [facility] for EGD  Patient called with appt. Information. Reviewed Instructions and mailed.

## 2018-10-25 ENCOUNTER — Telehealth: Payer: Self-pay

## 2018-10-25 NOTE — Telephone Encounter (Signed)
-----   Message from Roetta Sessions, [place] sent at 10/11/2018  2:42 PM EDT ----- Regarding: labs due Pt due for labs 1st or 2nd week in May for CBC, CMET, AFP and PT/INR. Orders are in.

## 2018-10-25 NOTE — Telephone Encounter (Signed)
Called and spoke to pt and let her know she needs to go to the lab.  She asked me to call her daughter and let her know.  Called and spoke to pt's daughter.  She will get her to the lab this week or next.

## 2018-10-26 ENCOUNTER — Telehealth: Payer: Self-pay | Admitting: Gastroenterology

## 2018-10-26 NOTE — Telephone Encounter (Signed)
Patient called requesting pain med. When I spoke with her she said her pain is in her back and legs, because she is so full and needs a Paracentesis. I saw your virtual visit 10/11/18 noting she would need large volume paracentesis. Do you want me to schedule one for her ?

## 2018-10-26 NOTE — Telephone Encounter (Signed)
Pt called in wanted to speak with the nurse about getting pain medication prescribe from the doctor. She mention that she asked about tylenol 3?

## 2018-10-26 NOTE — Telephone Encounter (Signed)
Yes okay to proceed with large volume paracentesis if she needs it. Can remove as much as they can, give albumin if > 5 L. Thanks

## 2018-10-27 ENCOUNTER — Other Ambulatory Visit: Payer: Self-pay

## 2018-10-27 DIAGNOSIS — R188 Other ascites: Secondary | ICD-10-CM

## 2018-10-27 NOTE — Telephone Encounter (Signed)
Do you want the Albumin "per Radiology protocol"

## 2018-10-27 NOTE — Telephone Encounter (Signed)
Thanks yes that would be fine

## 2018-10-27 NOTE — Telephone Encounter (Signed)
Patient scheduled for IR Paracentesis 10/28/18 @ 12:45pm at [facility]. Daughter called and notified of appt.

## 2018-10-28 ENCOUNTER — Other Ambulatory Visit: Payer: Self-pay

## 2018-10-28 ENCOUNTER — Encounter (HOSPITAL_COMMUNITY): Payer: Self-pay | Admitting: Student

## 2018-10-28 ENCOUNTER — Ambulatory Visit (HOSPITAL_COMMUNITY)
Admission: RE | Admit: 2018-10-28 | Discharge: 2018-10-28 | Disposition: A | Discharge status: Home / Self Care | Payer: Medicare Other | Source: Ambulatory Visit | Attending: Gastroenterology | Admitting: Gastroenterology

## 2018-10-28 DIAGNOSIS — K7031 Alcoholic cirrhosis of liver with ascites: Secondary | ICD-10-CM | POA: Diagnosis present

## 2018-10-28 DIAGNOSIS — R188 Other ascites: Secondary | ICD-10-CM | POA: Diagnosis not present

## 2018-10-28 HISTORY — PX: IR PARACENTESIS: IMG2679

## 2018-10-28 MED ORDER — ALBUMIN HUMAN 25 % IV SOLN
INTRAVENOUS | Status: AC
  Filled 2018-10-28: qty 200

## 2018-10-28 MED ORDER — LIDOCAINE HCL (PF) 1 % IJ SOLN
INTRAMUSCULAR | Status: AC | PRN
  Administered 2018-10-28: 10 mL

## 2018-10-28 MED ORDER — LIDOCAINE HCL 1 % IJ SOLN
INTRAMUSCULAR | Status: DC | PRN
  Administered 2018-10-28: 10 mL

## 2018-10-28 MED ORDER — LIDOCAINE HCL 1 % IJ SOLN
INTRAMUSCULAR | Status: AC
  Filled 2018-10-28: qty 20

## 2018-10-28 MED ORDER — ALBUMIN HUMAN 25 % IV SOLN
50.0000 g | Freq: Once | INTRAVENOUS | Status: AC
  Administered 2018-10-28: 50 g via INTRAVENOUS

## 2018-10-28 NOTE — Procedures (Signed)
PROCEDURE SUMMARY:  Successful image-guided paracentesis from the right lower abdomen.  Yielded 5.6 liters of clear yellow fluid.  No immediate complications.  EBL = 0 mL. Patient tolerated well.   Specimen was not sent for labs.  Claris Pong Jelisa Kemp PA-C 10/28/2018 1:37 PM

## 2018-11-01 ENCOUNTER — Other Ambulatory Visit: Payer: Self-pay

## 2018-11-01 ENCOUNTER — Telehealth: Payer: Self-pay

## 2018-11-01 NOTE — Telephone Encounter (Signed)
Called patient and daughter and notified of Katie Holland that will be required from 11/04/18 COVID-19 testing until 11/08/18 procedure date

## 2018-11-01 NOTE — Progress Notes (Signed)
error 

## 2018-11-03 NOTE — Progress Notes (Signed)
Talked with patients daughter. She stated they had been called regarding getting covid test for outpatient procedure scheduled for Monday. They are planning to go tomorrow and are aware of quarantine procedures. Questions answered.

## 2018-11-04 ENCOUNTER — Other Ambulatory Visit: Payer: Self-pay

## 2018-11-04 ENCOUNTER — Other Ambulatory Visit (INDEPENDENT_AMBULATORY_CARE_PROVIDER_SITE_OTHER): Payer: Medicare Other

## 2018-11-04 ENCOUNTER — Other Ambulatory Visit (HOSPITAL_COMMUNITY): Admission: RE | Admit: 2018-11-04 | Payer: Medicare Other | Source: Ambulatory Visit

## 2018-11-04 ENCOUNTER — Other Ambulatory Visit (HOSPITAL_COMMUNITY)
Admission: RE | Admit: 2018-11-04 | Discharge: 2018-11-04 | Disposition: A | Discharge status: Home / Self Care | Payer: Medicare Other | Source: Ambulatory Visit | Attending: Gastroenterology | Admitting: Gastroenterology

## 2018-11-04 DIAGNOSIS — Z1159 Encounter for screening for other viral diseases: Secondary | ICD-10-CM | POA: Diagnosis not present

## 2018-11-04 DIAGNOSIS — K7290 Hepatic failure, unspecified without coma: Secondary | ICD-10-CM | POA: Diagnosis not present

## 2018-11-04 DIAGNOSIS — I8501 Esophageal varices with bleeding: Secondary | ICD-10-CM

## 2018-11-04 DIAGNOSIS — K7682 Hepatic encephalopathy: Secondary | ICD-10-CM

## 2018-11-04 DIAGNOSIS — K7031 Alcoholic cirrhosis of liver with ascites: Secondary | ICD-10-CM | POA: Diagnosis not present

## 2018-11-04 LAB — CBC WITH DIFFERENTIAL/PLATELET
Basophils Absolute: 0 10*3/uL (ref 0.0–0.1)
Basophils Relative: 1 % (ref 0.0–3.0)
Eosinophils Absolute: 0.3 10*3/uL (ref 0.0–0.7)
Eosinophils Relative: 7 % — ABNORMAL HIGH (ref 0.0–5.0)
HCT: 34 % — ABNORMAL LOW (ref 36.0–46.0)
Hemoglobin: 11.3 g/dL — ABNORMAL LOW (ref 12.0–15.0)
Lymphocytes Relative: 46.1 % — ABNORMAL HIGH (ref 12.0–46.0)
Lymphs Abs: 1.8 10*3/uL (ref 0.7–4.0)
MCHC: 33.4 g/dL (ref 30.0–36.0)
MCV: 85.6 fl (ref 78.0–100.0)
Monocytes Absolute: 0.4 10*3/uL (ref 0.1–1.0)
Monocytes Relative: 10.9 % (ref 3.0–12.0)
Neutro Abs: 1.4 10*3/uL (ref 1.4–7.7)
Neutrophils Relative %: 35 % — ABNORMAL LOW (ref 43.0–77.0)
Platelets: 77 10*3/uL — ABNORMAL LOW (ref 150.0–400.0)
RBC: 3.97 Mil/uL (ref 3.87–5.11)
RDW: 23.1 % — ABNORMAL HIGH (ref 11.5–15.5)
WBC: 3.9 10*3/uL — ABNORMAL LOW (ref 4.0–10.5)

## 2018-11-04 LAB — COMPREHENSIVE METABOLIC PANEL
ALT: 12 U/L (ref 0–35)
AST: 48 U/L — ABNORMAL HIGH (ref 0–37)
Albumin: 3.1 g/dL — ABNORMAL LOW (ref 3.5–5.2)
Alkaline Phosphatase: 183 U/L — ABNORMAL HIGH (ref 39–117)
BUN: 7 mg/dL (ref 6–23)
CO2: 26 mEq/L (ref 19–32)
Calcium: 8.5 mg/dL (ref 8.4–10.5)
Chloride: 104 mEq/L (ref 96–112)
Creatinine, Ser: 0.57 mg/dL (ref 0.40–1.20)
GFR: 133.82 mL/min (ref >60.00)
Glucose, Bld: 90 mg/dL (ref 70–99)
Potassium: 3.4 mEq/L — ABNORMAL LOW (ref 3.5–5.1)
Sodium: 139 mEq/L (ref 135–145)
Total Bilirubin: 1.9 mg/dL — ABNORMAL HIGH (ref 0.2–1.2)
Total Protein: 6.6 g/dL (ref 6.0–8.3)

## 2018-11-04 LAB — PROTIME-INR
INR: 1.3 ratio — ABNORMAL HIGH (ref 0.8–1.0)
Prothrombin Time: 15.3 s — ABNORMAL HIGH (ref 9.6–13.1)

## 2018-11-05 ENCOUNTER — Other Ambulatory Visit: Payer: Self-pay

## 2018-11-05 ENCOUNTER — Encounter (HOSPITAL_COMMUNITY): Payer: Self-pay | Admitting: *Deleted

## 2018-11-05 LAB — NOVEL CORONAVIRUS, NAA (HOSP ORDER, SEND-OUT TO REF LAB; TAT 18-24 HRS): SARS-CoV-2, NAA: NOT DETECTED

## 2018-11-05 LAB — AFP TUMOR MARKER: AFP-Tumor Marker: 2.1 ng/mL

## 2018-11-07 NOTE — Anesthesia Preprocedure Evaluation (Addendum)
Anesthesia Evaluation  Patient identified by MRN, date of birth, ID band Patient awake    Reviewed: Allergy & Precautions, H&P , NPO status , Patient's Chart, lab work & pertinent test results  Airway Mallampati: II  TM Distance: >3 FB Neck ROM: Full    Dental no notable dental hx. (+) Teeth Intact   Pulmonary Current Smoker,    Pulmonary exam normal breath sounds clear to auscultation       Cardiovascular Normal cardiovascular exam Rhythm:Regular Rate:Normal  06/04/18 EKG SR R 95   Neuro/Psych negative neurological ROS     GI/Hepatic GERD  Medicated,(+) Cirrhosis   Esophageal Varices and ascites  substance abuse  alcohol use,   Endo/Other    Renal/GU      Musculoskeletal   Abdominal   Peds  Hematology Hgb 11.3 Plts 77   Anesthesia Other Findings   Reproductive/Obstetrics                            Anesthesia Physical  Anesthesia Plan  ASA: III  Anesthesia Plan: MAC   Post-op Pain Management:    Induction: Intravenous  PONV Risk Score and Plan: 2 and Treatment may vary due to age or medical condition  Airway Management Planned: Nasal Cannula and Natural Airway  Additional Equipment:   Intra-op Plan:   Post-operative Plan:   Informed Consent: I have reviewed the patients History and Physical, chart, labs and discussed the procedure including the risks, benefits and alternatives for the proposed anesthesia with the patient or authorized representative who has indicated his/her understanding and acceptance.     Dental advisory given  Plan Discussed with: CRNA, Anesthesiologist and Surgeon  Anesthesia Plan Comments:        Anesthesia Quick Evaluation

## 2018-11-08 ENCOUNTER — Ambulatory Visit (HOSPITAL_COMMUNITY)
Admission: RE | Admit: 2018-11-08 | Discharge: 2018-11-08 | Disposition: A | Discharge status: Home / Self Care | Payer: Medicare Other | Attending: Gastroenterology | Admitting: Gastroenterology

## 2018-11-08 ENCOUNTER — Encounter (HOSPITAL_COMMUNITY): Payer: Self-pay | Admitting: Certified Registered Nurse Anesthetist

## 2018-11-08 ENCOUNTER — Other Ambulatory Visit: Payer: Self-pay

## 2018-11-08 ENCOUNTER — Ambulatory Visit (HOSPITAL_COMMUNITY): Payer: Medicare Other | Admitting: Anesthesiology

## 2018-11-08 ENCOUNTER — Other Ambulatory Visit (HOSPITAL_COMMUNITY): Payer: Medicare Other

## 2018-11-08 ENCOUNTER — Encounter (HOSPITAL_COMMUNITY)
Admission: RE | Disposition: A | Discharge status: Home / Self Care | Payer: Self-pay | Source: Home / Self Care | Attending: Gastroenterology

## 2018-11-08 DIAGNOSIS — Z9889 Other specified postprocedural states: Secondary | ICD-10-CM | POA: Insufficient documentation

## 2018-11-08 DIAGNOSIS — K3189 Other diseases of stomach and duodenum: Secondary | ICD-10-CM

## 2018-11-08 DIAGNOSIS — Z8719 Personal history of other diseases of the digestive system: Secondary | ICD-10-CM | POA: Diagnosis not present

## 2018-11-08 DIAGNOSIS — I8500 Esophageal varices without bleeding: Secondary | ICD-10-CM | POA: Diagnosis not present

## 2018-11-08 DIAGNOSIS — I8501 Esophageal varices with bleeding: Secondary | ICD-10-CM

## 2018-11-08 DIAGNOSIS — F1011 Alcohol abuse, in remission: Secondary | ICD-10-CM | POA: Insufficient documentation

## 2018-11-08 DIAGNOSIS — K7290 Hepatic failure, unspecified without coma: Secondary | ICD-10-CM | POA: Diagnosis not present

## 2018-11-08 DIAGNOSIS — F17210 Nicotine dependence, cigarettes, uncomplicated: Secondary | ICD-10-CM | POA: Diagnosis not present

## 2018-11-08 DIAGNOSIS — R188 Other ascites: Secondary | ICD-10-CM | POA: Diagnosis not present

## 2018-11-08 DIAGNOSIS — Z86718 Personal history of other venous thrombosis and embolism: Secondary | ICD-10-CM | POA: Insufficient documentation

## 2018-11-08 DIAGNOSIS — Z79899 Other long term (current) drug therapy: Secondary | ICD-10-CM | POA: Insufficient documentation

## 2018-11-08 DIAGNOSIS — Z09 Encounter for follow-up examination after completed treatment for conditions other than malignant neoplasm: Secondary | ICD-10-CM | POA: Insufficient documentation

## 2018-11-08 DIAGNOSIS — K219 Gastro-esophageal reflux disease without esophagitis: Secondary | ICD-10-CM | POA: Diagnosis not present

## 2018-11-08 DIAGNOSIS — K7031 Alcoholic cirrhosis of liver with ascites: Secondary | ICD-10-CM | POA: Insufficient documentation

## 2018-11-08 DIAGNOSIS — K766 Portal hypertension: Secondary | ICD-10-CM | POA: Insufficient documentation

## 2018-11-08 HISTORY — PX: ESOPHAGOGASTRODUODENOSCOPY (EGD) WITH PROPOFOL: SHX5813

## 2018-11-08 SURGERY — ESOPHAGOGASTRODUODENOSCOPY (EGD) WITH PROPOFOL
Anesthesia: Monitor Anesthesia Care

## 2018-11-08 MED ORDER — PROPOFOL 10 MG/ML IV BOLUS
INTRAVENOUS | Status: AC
  Filled 2018-11-08: qty 60

## 2018-11-08 MED ORDER — LIDOCAINE 2% (20 MG/ML) 5 ML SYRINGE
INTRAMUSCULAR | Status: DC | PRN
  Administered 2018-11-08: 100 mg via INTRAVENOUS

## 2018-11-08 MED ORDER — PROPOFOL 10 MG/ML IV BOLUS
INTRAVENOUS | Status: DC | PRN
  Administered 2018-11-08: 40 mg via INTRAVENOUS

## 2018-11-08 MED ORDER — LACTATED RINGERS IV SOLN
INTRAVENOUS | Status: DC
  Administered 2018-11-08: 07:00:00 via INTRAVENOUS

## 2018-11-08 MED ORDER — PROPOFOL 500 MG/50ML IV EMUL
INTRAVENOUS | Status: DC | PRN
  Administered 2018-11-08: 175 ug/kg/min via INTRAVENOUS

## 2018-11-08 MED ORDER — SODIUM CHLORIDE 0.9 % IV SOLN: INTRAVENOUS | Status: DC

## 2018-11-08 SURGICAL SUPPLY — 14 items

## 2018-11-08 NOTE — Discharge Instructions (Signed)

## 2018-11-08 NOTE — Transfer of Care (Signed)
Immediate Anesthesia Transfer of Care Note  Patient: Katie Holland  Procedure(s) Performed: ESOPHAGOGASTRODUODENOSCOPY (EGD) WITH PROPOFOL (N/A ) ESOPHAGEAL BANDING (N/A )  Patient Location: Endoscopy Unit  Anesthesia Type:MAC  Level of Consciousness: awake, alert , oriented and patient cooperative  Airway & Oxygen Therapy: Patient Spontanous Breathing and Patient connected to nasal cannula oxygen  Post-op Assessment: Report given to RN, Post -op Vital signs reviewed and stable and Patient moving all extremities  Post vital signs: Reviewed and stable  Last Vitals:  Vitals Value Taken Time  BP    Temp    Pulse    Resp    SpO2      Last Pain:  Vitals:   11/08/18 0642  TempSrc: Oral  PainSc: 0-No pain         Complications: No apparent anesthesia complications

## 2018-11-08 NOTE — Anesthesia Postprocedure Evaluation (Signed)
Anesthesia Post Note  Patient: Katie Holland  Procedure(s) Performed: ESOPHAGOGASTRODUODENOSCOPY (EGD) WITH PROPOFOL (N/A ) ESOPHAGEAL BANDING (N/A )     Patient location during evaluation: PACU Anesthesia Type: MAC Level of consciousness: awake and alert Pain management: pain level controlled Vital Signs Assessment: post-procedure vital signs reviewed and stable Respiratory status: spontaneous breathing, nonlabored ventilation, respiratory function stable and patient connected to nasal cannula oxygen Cardiovascular status: stable and blood pressure returned to baseline Postop Assessment: no apparent nausea or vomiting Anesthetic complications: no    Last Vitals:  Vitals:   11/08/18 0800 11/08/18 0812  BP: (!) 147/91 (!) 151/98  Pulse: 87 85  Resp: 16 (!) 23  Temp:    SpO2: 96% 97%    Last Pain:  Vitals:   11/08/18 0812  TempSrc:   PainSc: 0-No pain                 [name]

## 2018-11-08 NOTE — Interval H&P Note (Signed)
History and Physical Interval Note:  11/08/2018 7:16 AM  Katie Holland  has presented today for surgery, with the diagnosis of esophageal varices/alcoholic cirrhosis with Ascites.  The various methods of treatment have been discussed with the patient and family. After consideration of risks, benefits and other options for treatment, the patient has consented to  Procedure(s): ESOPHAGOGASTRODUODENOSCOPY (EGD) WITH PROPOFOL (N/A) ESOPHAGEAL BANDING (N/A) as a surgical intervention.  The patient's history has been reviewed, patient examined, no change in status, stable for surgery.  I have reviewed the patient's chart and labs.  Questions were answered to the patient's satisfaction.     [place]

## 2018-11-08 NOTE — Op Note (Signed)
[facility] Patient Name: Katie Holland Procedure Date: 11/08/2018 MRN: [id number] Attending MD: Carlota Raspberry. Havery Moros , MD Date of Birth: 23-Jan-1966 CSN: [id number] Age: 53 Admit Type: Inpatient Procedure:                Upper GI endoscopy Indications:              Follow-up of esophageal varices with bleeding, 25                            bands placed since November 2019 for eradication.                            Here for surveillance. Providers:                Carlota Raspberry. Havery Moros, MD, Cleda Daub, RN, Ladona Ridgel, Technician, Elspeth Cho Tech.,                            Technician, Caryl Pina CRNA Referring MD:              Medicines:                Monitored Anesthesia Care Complications:            No immediate complications. Estimated blood loss:                            Minimal. Estimated Blood Loss:     Estimated blood loss was minimal. Procedure:                Pre-Anesthesia Assessment:                           - Prior to the procedure, a History and Physical                            was performed, and patient medications and                            allergies were reviewed. The patient's tolerance of                            previous anesthesia was also reviewed. The risks                            and benefits of the procedure and the sedation                            options and risks were discussed with the patient.                            All questions were answered, and informed consent                            was  obtained. Prior Anticoagulants: The patient has                            taken no previous anticoagulant or antiplatelet                            agents. ASA Grade Assessment: III - A patient with                            severe systemic disease. After reviewing the risks                            and benefits, the patient was deemed in                            satisfactory  condition to undergo the procedure.                           After obtaining informed consent, the endoscope was                            passed under direct vision. Throughout the                            procedure, the patient's blood pressure, pulse, and                            oxygen saturations were monitored continuously. The                            [id number]-H190 ([id number]) Olympus gastroscope was                            introduced through the mouth, and advanced to the                            second part of duodenum. The upper GI endoscopy was                            accomplished without difficulty. The patient                            tolerated the procedure well. Scope In: Scope Out: Findings:      The examined esophagus was normal. No varices appreciated. Numerous       areas of scarring noted from prior banding.      Edematous mucosa was found in the entire examined stomach secondary to       portal hypertension.      The exam of the stomach was otherwise normal.      The duodenal bulb and second portion of the duodenum were normal. Impression:               - Normal esophagus - eradication of varices has  been achieved, no further banding performed                           - Edematous gastric mucosa secondary to portal                            hypertension.                           - Normal stomach otherwise - no gastric varices                            appreciated.                           - Normal duodenal bulb and second portion of the                            duodenum. Moderate Sedation:      No moderate sedation, case performed with MAC Recommendation:           - Patient has a contact number available for                            emergencies. The signs and symptoms of potential                            delayed complications were discussed with the                            patient. Return to normal activities  tomorrow.                            Written discharge instructions were provided to the                            patient.                           - Resume previous diet.                           - Continue present medications.                           - Repeat upper endoscopy in 3 months for                            surveillance. Procedure Code(s):        --- Professional ---                           [phone], Esophagogastroduodenoscopy, flexible,                            transoral; diagnostic, including collection of  specimen(s) by brushing or washing, when performed                            (separate procedure) Diagnosis Code(s):        --- Professional ---                           K31.89, Other diseases of stomach and duodenum                           I85.00, Esophageal varices without bleeding CPT copyright 2019 American Medical Association. All rights reserved. The codes documented in this report are preliminary and upon coder review may  be revised to meet current compliance requirements. Remo Lipps P. Sharifa Bucholz, MD 11/08/2018 7:48:48 AM This report has been signed electronically. Number of Addenda: 0

## 2018-11-09 ENCOUNTER — Encounter (HOSPITAL_COMMUNITY): Payer: Self-pay | Admitting: Gastroenterology

## 2018-11-23 ENCOUNTER — Ambulatory Visit (HOSPITAL_COMMUNITY): Payer: Medicare Other

## 2018-11-29 ENCOUNTER — Ambulatory Visit (HOSPITAL_COMMUNITY)
Admission: RE | Admit: 2018-11-29 | Discharge: 2018-11-29 | Disposition: A | Discharge status: Home / Self Care | Payer: Medicare Other | Source: Ambulatory Visit | Attending: Gastroenterology | Admitting: Gastroenterology

## 2018-11-29 ENCOUNTER — Other Ambulatory Visit: Payer: Self-pay

## 2018-11-29 DIAGNOSIS — K7460 Unspecified cirrhosis of liver: Secondary | ICD-10-CM | POA: Diagnosis not present

## 2018-11-29 DIAGNOSIS — K7031 Alcoholic cirrhosis of liver with ascites: Secondary | ICD-10-CM | POA: Diagnosis present

## 2018-11-29 DIAGNOSIS — K7682 Hepatic encephalopathy: Secondary | ICD-10-CM

## 2018-11-29 DIAGNOSIS — I8501 Esophageal varices with bleeding: Secondary | ICD-10-CM | POA: Insufficient documentation

## 2018-11-29 DIAGNOSIS — K7290 Hepatic failure, unspecified without coma: Secondary | ICD-10-CM | POA: Insufficient documentation

## 2018-12-03 ENCOUNTER — Other Ambulatory Visit: Payer: Self-pay

## 2018-12-03 DIAGNOSIS — K7031 Alcoholic cirrhosis of liver with ascites: Secondary | ICD-10-CM

## 2018-12-03 NOTE — Telephone Encounter (Signed)
Pt's daughter called regarding appt.

## 2018-12-07 ENCOUNTER — Ambulatory Visit (INDEPENDENT_AMBULATORY_CARE_PROVIDER_SITE_OTHER)
Admission: RE | Admit: 2018-12-07 | Discharge: 2018-12-07 | Disposition: A | Discharge status: Home / Self Care | Payer: Medicare Other | Source: Ambulatory Visit | Attending: Gastroenterology | Admitting: Gastroenterology

## 2018-12-07 ENCOUNTER — Other Ambulatory Visit: Payer: Self-pay

## 2018-12-07 DIAGNOSIS — K7031 Alcoholic cirrhosis of liver with ascites: Secondary | ICD-10-CM

## 2018-12-07 DIAGNOSIS — J90 Pleural effusion, not elsewhere classified: Secondary | ICD-10-CM | POA: Diagnosis not present

## 2018-12-07 DIAGNOSIS — J9811 Atelectasis: Secondary | ICD-10-CM | POA: Diagnosis not present

## 2018-12-08 ENCOUNTER — Other Ambulatory Visit: Payer: Self-pay

## 2018-12-08 ENCOUNTER — Telehealth: Payer: Self-pay | Admitting: Gastroenterology

## 2018-12-08 DIAGNOSIS — R188 Other ascites: Secondary | ICD-10-CM

## 2018-12-09 ENCOUNTER — Other Ambulatory Visit (HOSPITAL_COMMUNITY)
Admission: RE | Admit: 2018-12-09 | Discharge: 2018-12-09 | Disposition: A | Discharge status: Home / Self Care | Payer: Medicare Other | Source: Ambulatory Visit | Attending: Gastroenterology | Admitting: Gastroenterology

## 2018-12-09 ENCOUNTER — Telehealth: Payer: Self-pay | Admitting: Gastroenterology

## 2018-12-09 DIAGNOSIS — Z1159 Encounter for screening for other viral diseases: Secondary | ICD-10-CM | POA: Diagnosis not present

## 2018-12-09 LAB — SARS CORONAVIRUS 2 (TAT 6-24 HRS): SARS Coronavirus 2: NEGATIVE

## 2018-12-09 NOTE — Telephone Encounter (Signed)
Routed to [place], Therapist, sports

## 2018-12-09 NOTE — Telephone Encounter (Signed)
Katie Holland from Radiology scheduling at [facility] called to request copy of order.

## 2018-12-10 ENCOUNTER — Other Ambulatory Visit (HOSPITAL_COMMUNITY): Payer: Self-pay | Admitting: Physician Assistant

## 2018-12-10 ENCOUNTER — Ambulatory Visit (HOSPITAL_COMMUNITY)
Admission: RE | Admit: 2018-12-10 | Discharge: 2018-12-10 | Disposition: A | Discharge status: Home / Self Care | Payer: Medicare Other | Source: Ambulatory Visit | Attending: Gastroenterology | Admitting: Gastroenterology

## 2018-12-10 ENCOUNTER — Other Ambulatory Visit: Payer: Self-pay

## 2018-12-10 ENCOUNTER — Encounter (HOSPITAL_COMMUNITY): Payer: Self-pay | Admitting: Physician Assistant

## 2018-12-10 ENCOUNTER — Ambulatory Visit (HOSPITAL_COMMUNITY)
Admission: RE | Admit: 2018-12-10 | Discharge: 2018-12-10 | Disposition: A | Discharge status: Home / Self Care | Payer: Medicare Other | Source: Ambulatory Visit | Attending: Physician Assistant | Admitting: Physician Assistant

## 2018-12-10 DIAGNOSIS — R188 Other ascites: Secondary | ICD-10-CM

## 2018-12-10 DIAGNOSIS — J90 Pleural effusion, not elsewhere classified: Secondary | ICD-10-CM | POA: Insufficient documentation

## 2018-12-10 DIAGNOSIS — R899 Unspecified abnormal finding in specimens from other organs, systems and tissues: Secondary | ICD-10-CM | POA: Diagnosis not present

## 2018-12-10 DIAGNOSIS — R918 Other nonspecific abnormal finding of lung field: Secondary | ICD-10-CM | POA: Diagnosis not present

## 2018-12-10 HISTORY — PX: IR THORACENTESIS ASP PLEURAL SPACE W/IMG GUIDE: IMG5380

## 2018-12-10 LAB — BODY FLUID CELL COUNT WITH DIFFERENTIAL
Lymphs, Fluid: 38 %
Monocyte-Macrophage-Serous Fluid: 57 % (ref 50–90)
Neutrophil Count, Fluid: 5 % (ref 0–25)
Total Nucleated Cell Count, Fluid: 390 cu mm (ref 0–1000)

## 2018-12-10 LAB — GLUCOSE, PLEURAL OR PERITONEAL FLUID: Glucose, Fluid: 96 mg/dL

## 2018-12-10 LAB — PROTEIN, PLEURAL OR PERITONEAL FLUID: Total protein, fluid: <3 g/dL

## 2018-12-10 MED ORDER — LIDOCAINE HCL 1 % IJ SOLN
INTRAMUSCULAR | Status: AC
  Filled 2018-12-10: qty 20

## 2018-12-10 MED ORDER — LIDOCAINE HCL 1 % IJ SOLN
INTRAMUSCULAR | Status: AC | PRN
  Administered 2018-12-10: 10 mL

## 2018-12-12 LAB — PH, BODY FLUID: pH, Body Fluid: 7.7

## 2018-12-13 ENCOUNTER — Other Ambulatory Visit: Payer: Self-pay

## 2018-12-13 DIAGNOSIS — R188 Other ascites: Secondary | ICD-10-CM

## 2018-12-14 LAB — PATHOLOGIST SMEAR REVIEW: Path Review: 6232020

## 2018-12-15 ENCOUNTER — Telehealth: Payer: Self-pay | Admitting: Gastroenterology

## 2018-12-15 ENCOUNTER — Telehealth: Payer: Self-pay

## 2018-12-15 ENCOUNTER — Other Ambulatory Visit: Payer: Self-pay

## 2018-12-15 DIAGNOSIS — R188 Other ascites: Secondary | ICD-10-CM

## 2018-12-15 NOTE — Telephone Encounter (Signed)
Pt's daughter [name] returned your call from yesterday. Pls call her again.

## 2018-12-15 NOTE — Telephone Encounter (Signed)
-----   Message from Yetta Flock, MD sent at 12/14/2018  1:26 PM EDT ----- Regarding: FW: ? TIPS Candidate? Sherlynn Stalls can you tell the patient I have spoke with IR, and they may consider her for a TIPS procedure to help with her ascites. I think if she can get this, it may really help her. Can you let her know and place consult to IR for possible TIPS? They may do further studies to clarify if she is a candidate. Thanks  ----- Message ----- From: Ardis Rowan, PA-C Sent: 12/14/2018  11:17 AM EDT To: Yetta Flock, MD Subject: RE: ? TIPS Candidate?                          I had Dr. Laurence Ferrari review her chart and imaging.  Her liver doppler on 06/02/18 did show main protal vein thrombosis.   He recommends CT scan with BRTO protocol to confirm. Her MELD was 12 by my calculations and that is OK per Dr. Laurence Ferrari. She would also need an ECHO to evaluate for heart failure.   We are happy to order these studies after a formal consultation if you would like?  Thank you!  -Gareth Eagle  ----- Message ----- From: Yetta Flock, MD Sent: 12/13/2018  12:21 PM EDT To: Ardis Rowan, PA-C Subject: RE: ? TIPS Candidate?                          Hi,  Thanks for your help with her, yes difficult case. I think I had spoken with one of your colleagues about her in the past regarding TIPS. Due to reported portal vein thrombosis and high MELD she was not thought to be a good candidate. If you think from that perspective she is improved, and eligible let me know as I think that would really help her. What is your cutoff for MELD? Thanks,  Richardson Landry ----- Message ----- From: Ardis Rowan, PA-C Sent: 12/10/2018   1:08 PM EDT To: Yetta Flock, MD Subject: ? TIPS Candidate?                              I performed a right thoracentesis on Ms Midkiff this morning. She did well. She is known to IR service for routine paracentesis and also had ascites on exam today.  Do  you feel she might be a good candidate for a TIPS procedure?   If so, please order "IR Radiologist Eval and Manage" for TIPS consult in [place].   Thank you!  Gareth Eagle, [facility]

## 2018-12-15 NOTE — Telephone Encounter (Signed)
Order in [place] for an evaluation for TIPS and also called IR and let Anderson Malta to look for the request in [place]. Called daughter and she is willing to have the evaluation and get more information, before making a finally decision whether to have it done

## 2018-12-15 NOTE — Telephone Encounter (Signed)
Order in [place] for TIPS evaluation. Also called and left message for Anderson Malta in [place] [facility] to please call patient's daughter Katie Holland at [phone] when scheduling appt.

## 2018-12-20 DIAGNOSIS — I8500 Esophageal varices without bleeding: Secondary | ICD-10-CM | POA: Diagnosis not present

## 2018-12-20 DIAGNOSIS — K7290 Hepatic failure, unspecified without coma: Secondary | ICD-10-CM | POA: Diagnosis not present

## 2018-12-20 DIAGNOSIS — R188 Other ascites: Secondary | ICD-10-CM | POA: Diagnosis not present

## 2018-12-21 ENCOUNTER — Telehealth: Payer: Self-pay

## 2018-12-21 NOTE — Telephone Encounter (Signed)
-----   Message from Roetta Sessions, [place] sent at 10/11/2018  2:40 PM EDT ----- Regarding: hep a - 2nd and final due Pt due for 2nd and final Hep A  next week on July 7th

## 2018-12-21 NOTE — Telephone Encounter (Signed)
Called and spoke to pt. Scheduled her for nurse visit next Wednesday in 7-8 at 10amf or her 2nd and final Hepatitis A injection. Informed her to wear mask and call if she develops Covid symptoms.

## 2018-12-23 ENCOUNTER — Other Ambulatory Visit: Payer: Self-pay | Admitting: *Deleted

## 2018-12-23 ENCOUNTER — Other Ambulatory Visit: Payer: Self-pay

## 2018-12-23 ENCOUNTER — Telehealth: Payer: Self-pay

## 2018-12-23 ENCOUNTER — Other Ambulatory Visit: Payer: Self-pay | Admitting: Interventional Radiology

## 2018-12-23 ENCOUNTER — Ambulatory Visit
Admission: RE | Admit: 2018-12-23 | Discharge: 2018-12-23 | Disposition: A | Discharge status: Home / Self Care | Payer: Medicare Other | Source: Ambulatory Visit | Attending: Gastroenterology | Admitting: Gastroenterology

## 2018-12-23 ENCOUNTER — Encounter: Payer: Self-pay | Admitting: *Deleted

## 2018-12-23 DIAGNOSIS — K766 Portal hypertension: Secondary | ICD-10-CM

## 2018-12-23 DIAGNOSIS — R188 Other ascites: Secondary | ICD-10-CM

## 2018-12-23 HISTORY — PX: IR RADIOLOGIST EVAL & MGMT: IMG5224

## 2018-12-23 NOTE — Telephone Encounter (Signed)
Called patient to let her know 13hr prep called to her [facility] in [place].  She was informed she will need to take Prednisone 50mg  PO 7/8 @ 1930, 7/9 @ 0130 and 0730; Benadryl 50mg  PO 7/9 @ 0730.

## 2018-12-23 NOTE — Consult Note (Signed)
Chief Complaint: Patient was consulted remotely today (TeleHealth) for TIPS  at the request of Armbruster,Steven P.    Referring Physician(s): Armbruster,Steven P  History of Present Illness: Katie Holland is a 53 y.o. 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  She has had multiple endoscopies and banding of large bleeding esophageal varices, most recently 08/29/2017..  On a follow-up examination 11/08/2018 no further varices were identified.We have seen her 7 times this year for therapeutic paracentesis removing between 1.9 and 9.1 L in each setting.  She is tolerated these well.  A previous liver vascular Doppler ultrasound examination 06/02/2018 prescribed portal vein thrombosis with cavernous transformation.  She is currently compliant with lactulose and rifaximin to keep encephalopathy controlled.  She is cleared with mental status.  Nodular omentum was described on CT abdomen 04/25/2018 and she had an elevated Ca125 but cytology x2 has been negative.  She has started conversation with [facility] liver transplant team for work-up.   Past Medical History:  Diagnosis Date   Alcoholic liver disease ([place])    Chronic back pain    on disability   Cirrhosis of liver ([place])    Esophageal varices ([facility])    GERD (gastroesophageal reflux disease)    Jaundice     Past Surgical History:  Procedure Laterality Date   BACK SURGERY     ESOPHAGEAL BANDING  04/25/2018   Procedure: ESOPHAGEAL BANDING;  Surgeon: Yetta Flock, MD;  Location: [facility] ENDOSCOPY;  Service: Gastroenterology;;   ESOPHAGEAL BANDING  04/27/2018   Procedure: ESOPHAGEAL BANDING;  Surgeon: Milus Banister, MD;  Location: [place];  Service: Endoscopy;;   ESOPHAGEAL BANDING  06/02/2018   Procedure: ESOPHAGEAL BANDING;  Surgeon: Doran Stabler, MD;  Location: [place];  Service: Gastroenterology;;   ESOPHAGEAL BANDING N/A 07/07/2018   Procedure: ESOPHAGEAL BANDING;  Surgeon: Yetta Flock, MD;  Location: [facility] ENDOSCOPY;  Service: Gastroenterology;  Laterality: N/A;   ESOPHAGEAL BANDING  07/27/2018   Procedure: ESOPHAGEAL BANDING;  Surgeon: Yetta Flock, MD;  Location: [facility] ENDOSCOPY;  Service: Gastroenterology;;   ESOPHAGEAL BANDING  08/30/2018   Procedure: ESOPHAGEAL BANDING;  Surgeon: Yetta Flock, MD;  Location: [facility] ENDOSCOPY;  Service: Gastroenterology;;   ESOPHAGOGASTRODUODENOSCOPY N/A 04/27/2018   Procedure: ESOPHAGOGASTRODUODENOSCOPY (EGD);  Surgeon: Milus Banister, MD;  Location[facility] ENDOSCOPY;  Service: Endoscopy;  Laterality: N/A;   ESOPHAGOGASTRODUODENOSCOPY (EGD) WITH PROPOFOL N/A 04/25/2018   Procedure: ESOPHAGOGASTRODUODENOSCOPY (EGD) WITH PROPOFOL;  Surgeon: Yetta Flock, MD;  Location: [place];  Service: Gastroenterology;  Laterality: N/A;   ESOPHAGOGASTRODUODENOSCOPY (EGD) WITH PROPOFOL N/A 06/02/2018   Procedure: ESOPHAGOGASTRODUODENOSCOPY (EGD) WITH PROPOFOL;  Surgeon: Doran Stabler, MD;  Location: [place];  Service: Gastroenterology;  Laterality: N/A;   ESOPHAGOGASTRODUODENOSCOPY (EGD) WITH PROPOFOL N/A 07/07/2018   Procedure: ESOPHAGOGASTRODUODENOSCOPY (EGD) WITH PROPOFOL;  Surgeon: Yetta Flock, MD;  Location: [facility] ENDOSCOPY;  Service: Gastroenterology;  Laterality: N/A;   ESOPHAGOGASTRODUODENOSCOPY (EGD) WITH PROPOFOL N/A 07/27/2018   Procedure: ESOPHAGOGASTRODUODENOSCOPY (EGD) WITH PROPOFOL;  Surgeon: Yetta Flock, MD;  Location: [facility] ENDOSCOPY;  Service: Gastroenterology;  Laterality: N/A;   ESOPHAGOGASTRODUODENOSCOPY (EGD) WITH PROPOFOL N/A 08/30/2018   Procedure: ESOPHAGOGASTRODUODENOSCOPY (EGD) WITH PROPOFOL;  Surgeon: Yetta Flock, MD;  Location: [facility] ENDOSCOPY;  Service: Gastroenterology;  Laterality: N/A;   ESOPHAGOGASTRODUODENOSCOPY (EGD) WITH PROPOFOL N/A  11/08/2018   Procedure: ESOPHAGOGASTRODUODENOSCOPY (EGD) WITH PROPOFOL;  Surgeon:  Armbruster, Carlota Raspberry, MD;  Location: [facility] ENDOSCOPY;  Service: Gastroenterology;  Laterality: N/A;   IR PARACENTESIS  04/26/2018   IR PARACENTESIS  05/18/2018   IR PARACENTESIS  05/31/2018   IR PARACENTESIS  06/30/2018   IR PARACENTESIS  08/05/2018   IR PARACENTESIS  08/31/2018   IR PARACENTESIS  10/08/2018   IR PARACENTESIS  10/28/2018   IR RADIOLOGIST EVAL & MGMT  12/23/2018   IR THORACENTESIS ASP PLEURAL SPACE W/IMG GUIDE  12/10/2018    Allergies: Contrast media [iodinated diagnostic agents], Latex, and Sulfa antibiotics  Medications: Prior to Admission medications   Medication Sig Start Date End Date Taking? Authorizing Provider  acetaminophen (TYLENOL) 500 MG tablet Take 1,000 mg by mouth every 6 (six) hours as needed for moderate pain or headache.    [provider]  furosemide (LASIX) 20 MG tablet Take 2 tablets (40 mg total) by mouth 2 (two) times daily. Patient not taking: Reported on 11/01/2018 10/11/18   Yetta Flock, MD  Lactulose 20 GM/30ML SOLN Take 45 mLs (30 g total) by mouth 3 (three) times daily. Take 30 mls every eight hours. Titrate for 3 bm per 24 hrs Patient taking differently: Take 30 g by mouth 3 (three) times daily.  05/27/18   Esterwood, Amy S, PA-C  Multiple Vitamin (MULTIVITAMIN WITH MINERALS) TABS tablet Take 1 tablet by mouth daily.    [provider]  potassium chloride (K-DUR) 10 MEQ tablet Take 1 tablet (10 mEq total) by mouth daily. Patient not taking: Reported on 11/01/2018 10/11/18   Yetta Flock, MD  rifaximin (XIFAXAN) 550 MG TABS tablet Take 1 tablet (550 mg total) by mouth 2 (two) times daily. 05/27/18   Esterwood, Amy S, PA-C  spironolactone (ALDACTONE) 100 MG tablet Take 2 tablets (200 mg total) by mouth daily. 10/11/18   Armbruster, Carlota Raspberry, MD     Family History  Problem Relation Age of Onset   Colon cancer Neg Hx    Esophageal  cancer Neg Hx     Social History   Socioeconomic History   Marital status: Single    Spouse name: Not on file   Number of children: 8   Years of education: Not on file   Highest education level: Not on file  Occupational History   Occupation: disability  Social Needs   Financial resource strain: Not on file   Food insecurity    Worry: Not on file    Inability: Not on file   Transportation needs    Medical: Not on file    Non-medical: Not on file  Tobacco Use   Smoking status: Current Every Day Smoker    Packs/day: 0.75    Years: 20.00    Pack years: 15.00    Types: Cigarettes   Smokeless tobacco: Never Used  Substance and Sexual Activity   Alcohol use: Not Currently    Comment: denies h/o withdrawal, quit 05/23/2018   Drug use: Not Currently   Sexual activity: Not on file  Lifestyle   Physical activity    Days per week: Not on file    Minutes per session: Not on file   Stress: Not on file  Relationships   Social connections    Talks on phone: Not on file    Gets together: Not on file    Attends religious service: Not on file    Active member of club or organization: Not on file    Attends meetings of clubs or organizations: Not on file  Relationship status: Not on file  Other Topics Concern   Not on file  Social History Narrative   Not on file    ECOG Status: 1 - Symptomatic but completely ambulatory    Physical Exam No direct physical exam was performed (except for noted visual exam findings with Video Visits).   Review of Systems  Review of Systems: A 12 point ROS discussed and pertinent positives are indicated in the HPI above.  All other systems are negative. Vital Signs: There were no vitals taken for this visit.  Imaging: DUPLEX ULTRASOUND OF LIVER  TECHNIQUE: Color and duplex Doppler ultrasound was performed to evaluate the hepatic in-flow and out-flow vessels.  COMPARISON: CT of the abdomen without contrast on  04/25/2018  FINDINGS: Portal Vein Velocities  The portal vein is completely occluded and contains thrombus. There are some collateral vessels in the porta hepatis that appear to reconstitute some intrahepatic portal vein radicles in both right and left lobes. Findings are consistent with cavernous transformation.  Hepatic Vein Velocities  Right: 12 cm/sec  Middle: 19 cm/sec  Left: 30 cm/sec  Hepatic Artery Velocity: 196 cm/sec. Hepatic arterial waveform is normal. Elevated velocities are consistent with demonstrated portal vein thrombosis and compensatory increased hepatic arterial flow.  Splenic Vein Velocity: 26 cm/sec  Varices: No abdominal varices or perisplenic varices visualized by ultrasound.  Ascites: Large volume ascites identified in the peritoneal cavity, especially around the liver.  The liver to self demonstrates evidence of advanced cirrhosis and is small, fibrotic and shrunken in appearance with grossly nodular surface contour. No gross hepatic masses identified by ultrasound.  The spleen is not enlarged.  IMPRESSION: 1. Complete thrombosis of the main portal vein with collateral vessels in the porta hepatis that appear to reconstitute intrahepatic portal vein radicles. Findings are consistent with cavernous transformation. Due to what appears to be chronic thrombosis of the portal vein, the patient is likely not a candidate for TIPS. CT of the abdomen and pelvis with contrast may be of additional benefit in reconfirming portal vein thrombosis and assessing for other venous collateral pathways. 2. Compensatory increased hepatic arterial flow. 3. The hepatic veins are patent. Large volume of ascites present. 4. The liver shows evidence of advanced cirrhosis.   Electronically Signed By: Aletta Edouard M.D. On: 06/03/2018 08:17    Dg Chest 1 View  Result Date: 12/10/2018 CLINICAL DATA:  Status post RIGHT thoracentesis. EXAM: CHEST  1 VIEW  COMPARISON:  Chest x-ray dated 12/07/2018 FINDINGS: Improved aeration at the RIGHT lung base status post thoracentesis. No pneumothorax seen. Tenting of the RIGHT costophrenic angle, compatible with residual atelectasis. LEFT lung is clear. Heart size and mediastinal contours are within normal limits. IMPRESSION: Improved aeration at the RIGHT lung base status post thoracentesis. No pneumothorax seen. Electronically Signed   By: Franki Cabot M.D.   On: 12/10/2018 11:03   Dg Chest 2 View  Result Date: 12/08/2018 CLINICAL DATA:  Pleural effusion. EXAM: CHEST - 2 VIEW COMPARISON:  Chest x-ray 06/05/2018 FINDINGS: The cardiac silhouette, mediastinal and hilar contours are within normal limits and stable. There is moderate eventration of the right hemidiaphragm along with an overlying small right-sided pleural effusion and areas of subsegmental atelectasis. The left lung is clear. No left-sided pleural effusion. IMPRESSION: Right-sided pleural effusion with overlying atelectasis. Electronically Signed   By: Marijo Sanes M.D.   On: 12/08/2018 10:16   Ir Radiologist Eval & Mgmt  Result Date: 12/23/2018 Please refer to notes tab for details about interventional procedure. (  Op Note)  US Abdomen Limited Ruq  Result Date: 11/29/2018 CLINICAL DATA:  Cirrhosis, ascites EXAM: ULTRASOUND ABDOMEN LIMITED RIGHT UPPER QUADRANT COMPARISON:  None. FINDINGS: Gallbladder: Gallbladder of thickened at 5 mm. No visible stones or sonographic Murphy sign. This is likely related to liver disease. Common bile duct: Diameter: Normal caliber, 6 mm Liver: Heterogeneous echotexture with nodular contours compatible with cirrhosis. No focal hepatic abnormality or biliary ductal dilatation. Portal vein is patent on color Doppler imaging with normal direction of blood flow towards the liver. Moderate ascites and right effusion noted. IMPRESSION: Changes of cirrhosis.  Associated moderate ascites. Right pleural effusion. Gallbladder wall  thickening is likely related to liver disease. Electronically Signed   By: Rolm Baptise M.D.   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 Request for diagnostic and therapeutic thoracentesis. EXAM: ULTRASOUND GUIDED RIGHT THORACENTESIS MEDICATIONS: 1% lidocaine 10 mL COMPLICATIONS: None immediate. PROCEDURE: An ultrasound guided thoracentesis was thoroughly discussed with the patient and questions answered. The benefits, risks, alternatives and complications were also discussed. The patient understands and wishes to proceed with the procedure. Written consent was obtained. Ultrasound was performed to localize and mark an adequate pocket of fluid in the right chest. The area was then prepped and draped in the normal sterile fashion. 1% Lidocaine was used for local anesthesia. Under ultrasound guidance a 6 Fr Safe-T-Centesis catheter was introduced. Thoracentesis was performed. The catheter was removed and a dressing applied. FINDINGS: A total of approximately 1.9 L of clear amber fluid was removed. Samples were sent to the laboratory as requested by the clinical team. IMPRESSION: Successful ultrasound guided right thoracentesis yielding 1.9 L of pleural fluid. No pneumothorax on post-procedure chest x-ray. Read by: Gareth Eagle, PA-C Electronically Signed   By: Markus Daft M.D.   On: 12/10/2018 13:07    Labs:  CBC: Recent Labs    07/02/18 1436 08/05/18 1022 10/08/18 0754 11/04/18 0934  WBC 7.4 4.5 5.0 3.9*  HGB 10.6* 10.8* 11.8* 11.3*  HCT 31.4* 33.5* 35.5* 34.0*  PLT 147.0* 94* 82.0 Repeated and verified X2.* 77.0 Repeated and verified X2.*    COAGS: Recent Labs    06/04/18 0319 06/14/18 0926 07/02/18 1436 08/05/18 1022 11/04/18 0934  INR 1.47 1.6* 1.6* 1.45 1.3*  APTT 27  --   --   --   --     BMP: Recent Labs    06/04/18 0319 06/04/18 0548 06/05/18 0219   07/02/18 1436 08/05/18 1022 10/08/18 0754 11/04/18 0934  NA 142 142 140   < > 137 139 137 139  K 6.4* 4.6 4.2   < > 3.5 3.4* 3.2* 3.4*  CL 114* 113* 113*   < > 102 108 103 104  CO2 13* 20* 20*   < > 27 22 24 26   GLUCOSE 153* 147* 151*   < > 82 87 90 90  BUN 20 19 17    < > 10 5* 6 7  CALCIUM 6.6* 8.6* 8.7*   < > 9.6 8.9 8.8 8.5  CREATININE 1.07* 1.13* 1.01*   < > 0.72 0.70 0.59 0.57  GFRNONAA 59* 55* >60  --   --  >60  --   --   GFRAA >60 >60 >60  --   --  >60  --   --    < > = values in this interval not displayed.    LIVER FUNCTION TESTS: Recent  Labs    07/02/18 1436 08/05/18 1022 10/08/18 0754 11/04/18 0934  BILITOT 2.7* 4.4* 2.4* 1.9*  AST 40* 43* 37 48*  ALT 20 13 11 12   ALKPHOS 219* 134* 174* 183*  PROT 6.3 6.4* 6.9 6.6  ALBUMIN 3.5 2.8* 2.9* 3.1*    TUMOR MARKERS: Recent Labs    05/27/18 1514 11/04/18 0934  AFPTM 2.7 2.1   MELD score =12  Assessment and Plan:  My impression is that this patient has hepatic cirrhosis and portal venous hypertension resulting in recurrent large volume symptomatic abdominal ascites.  Possible portal vein thrombosis identified on recent ultrasound could complicate approach for TIPS creation.  I reviewed with the patient the pathophysiology of hepatic cirrhosis, portal venous hypertension, subsequent ascites accumulation, and esophageal varices.  We discussed the creation of TIPS for decompression of portal venous hypertension with anticipation of  decrease in development of abdominal ascites.  We discussed in detail the TIPS technique under anesthesia.  We discussed time course of symptom resolution or improvement.  We discussed possible risks and complications including but not limited to hepatic encephalopathy.  We discussed that the presence of a TIPS is not a contraindication for liver transplant.  She seemed to understand, and did ask appropriate questions which were answered.  She is interested in proceeding with the work-up.   We will need to get a CTA abdomen using BRTO protocol to better assess her portal venous system and technical feasibility of TIPS creation.  If this looks okay, we can set her up for  elective TIPS  at [facility] under general anesthesia with anticipated overnight observation. I will give her a call once I see the CTA results.   Thank you for this interesting consult.  I greatly enjoyed meeting Katie Holland and look forward to participating in their care.  A copy of this report was sent to the requesting provider on this date.  Electronically Signed: Rickard Rhymes 12/23/2018, 10:36 AM   I spent a total of    40 Minutes in remote  clinical consultation, greater than 50% of which was counseling/coordinating care for hepatic cirrhosis complicated by portal venous hypertension and recurrent large volume abdominal ascites.    Visit type: Audio only (telephone). Audio (no video) only due to patient's lack of internet/smartphone capability. Alternative for in-person consultation at [facility], [address] [place], [place], [place]. This visit type was conducted due to national recommendations for restrictions regarding the COVID-19 Pandemic (e.g. social distancing).  This format is felt to be most appropriate for this patient at this time.  All issues noted in this document were discussed and addressed.

## 2018-12-27 ENCOUNTER — Telehealth: Payer: Self-pay | Admitting: Gastroenterology

## 2018-12-27 NOTE — Telephone Encounter (Signed)
Called and spoke with patient's daughter, [name], [place] verified; [name] was informed of patient's scheduled appts in system; [name] verbalized udnerstanding of information/instructions via read back method; [name] was advised to call back to the office should questions/concerns arise;

## 2018-12-27 NOTE — Telephone Encounter (Signed)
Pt's daughter [name] would like a call back, she states that her mother will be having a stent placement this coming Friday she does not know with who, just stated that Dr. Havery Moros referred pt for that. She needs to know what time pt need to be there and all the logistics. Pls call her.

## 2018-12-29 ENCOUNTER — Other Ambulatory Visit: Payer: Self-pay

## 2018-12-29 ENCOUNTER — Ambulatory Visit (INDEPENDENT_AMBULATORY_CARE_PROVIDER_SITE_OTHER): Payer: Medicare Other | Admitting: Gastroenterology

## 2018-12-29 DIAGNOSIS — Z23 Encounter for immunization: Secondary | ICD-10-CM

## 2018-12-29 NOTE — Progress Notes (Signed)
Administered pt's 2nd and final Hep A injection

## 2018-12-30 ENCOUNTER — Ambulatory Visit (HOSPITAL_COMMUNITY)
Admission: RE | Admit: 2018-12-30 | Discharge: 2018-12-30 | Disposition: A | Discharge status: Home / Self Care | Payer: Medicare Other | Source: Ambulatory Visit | Attending: Interventional Radiology | Admitting: Interventional Radiology

## 2018-12-30 DIAGNOSIS — K766 Portal hypertension: Secondary | ICD-10-CM | POA: Insufficient documentation

## 2018-12-30 DIAGNOSIS — I10 Essential (primary) hypertension: Secondary | ICD-10-CM | POA: Diagnosis not present

## 2018-12-30 LAB — POCT I-STAT CREATININE: Creatinine, Ser: 0.5 mg/dL (ref 0.44–1.00)

## 2018-12-30 MED ORDER — IOHEXOL 350 MG/ML SOLN
100.0000 mL | Freq: Once | INTRAVENOUS | Status: AC | PRN
  Administered 2018-12-30: 100 mL via INTRAVENOUS

## 2018-12-30 MED ORDER — SODIUM CHLORIDE (PF) 0.9 % IJ SOLN
INTRAMUSCULAR | Status: AC
  Filled 2018-12-30: qty 50

## 2019-01-03 ENCOUNTER — Other Ambulatory Visit: Payer: Self-pay | Admitting: Interventional Radiology

## 2019-01-03 DIAGNOSIS — R188 Other ascites: Secondary | ICD-10-CM

## 2019-01-03 DIAGNOSIS — K7031 Alcoholic cirrhosis of liver with ascites: Secondary | ICD-10-CM

## 2019-01-05 ENCOUNTER — Ambulatory Visit
Admission: RE | Admit: 2019-01-05 | Discharge: 2019-01-05 | Disposition: A | Discharge status: Home / Self Care | Payer: Medicare Other | Source: Ambulatory Visit | Attending: Interventional Radiology | Admitting: Interventional Radiology

## 2019-01-05 ENCOUNTER — Other Ambulatory Visit: Payer: Self-pay

## 2019-01-05 DIAGNOSIS — R188 Other ascites: Secondary | ICD-10-CM

## 2019-01-05 DIAGNOSIS — K7031 Alcoholic cirrhosis of liver with ascites: Secondary | ICD-10-CM

## 2019-01-18 ENCOUNTER — Telehealth: Payer: Self-pay

## 2019-01-18 NOTE — Telephone Encounter (Signed)
Covid-19 screening questions   Do you now or have you had a fever in the last 14 days? No  Do you have any respiratory symptoms of shortness of breath or cough now or in the last 14 days? No  Do you have any family members or close contacts with diagnosed or suspected Covid-19 in the past 14 days? No  Have you been tested for Covid-19 and found to be positive? Was tested per patient In May and June and found to be negative

## 2019-01-19 ENCOUNTER — Other Ambulatory Visit: Payer: Self-pay

## 2019-01-19 ENCOUNTER — Inpatient Hospital Stay (HOSPITAL_COMMUNITY)
Admission: EM | Admit: 2019-01-19 | Discharge: 2019-01-23 | DRG: 433 | Disposition: A | Discharge status: Home / Self Care | Payer: Medicare Other | Attending: Family Medicine | Admitting: Family Medicine

## 2019-01-19 ENCOUNTER — Encounter (HOSPITAL_COMMUNITY): Payer: Self-pay | Admitting: Emergency Medicine

## 2019-01-19 ENCOUNTER — Telehealth: Payer: Self-pay | Admitting: Gastroenterology

## 2019-01-19 ENCOUNTER — Emergency Department (HOSPITAL_COMMUNITY): Payer: Medicare Other

## 2019-01-19 DIAGNOSIS — K7290 Hepatic failure, unspecified without coma: Secondary | ICD-10-CM | POA: Diagnosis not present

## 2019-01-19 DIAGNOSIS — K766 Portal hypertension: Secondary | ICD-10-CM | POA: Diagnosis not present

## 2019-01-19 DIAGNOSIS — Z79899 Other long term (current) drug therapy: Secondary | ICD-10-CM | POA: Diagnosis not present

## 2019-01-19 DIAGNOSIS — E876 Hypokalemia: Secondary | ICD-10-CM | POA: Diagnosis not present

## 2019-01-19 DIAGNOSIS — N390 Urinary tract infection, site not specified: Secondary | ICD-10-CM | POA: Diagnosis not present

## 2019-01-19 DIAGNOSIS — R188 Other ascites: Secondary | ICD-10-CM | POA: Diagnosis present

## 2019-01-19 DIAGNOSIS — K7031 Alcoholic cirrhosis of liver with ascites: Secondary | ICD-10-CM | POA: Diagnosis not present

## 2019-01-19 DIAGNOSIS — M549 Dorsalgia, unspecified: Secondary | ICD-10-CM | POA: Diagnosis not present

## 2019-01-19 DIAGNOSIS — G8929 Other chronic pain: Secondary | ICD-10-CM | POA: Diagnosis not present

## 2019-01-19 DIAGNOSIS — J90 Pleural effusion, not elsewhere classified: Secondary | ICD-10-CM | POA: Diagnosis not present

## 2019-01-19 DIAGNOSIS — K219 Gastro-esophageal reflux disease without esophagitis: Secondary | ICD-10-CM | POA: Diagnosis present

## 2019-01-19 DIAGNOSIS — I8501 Esophageal varices with bleeding: Secondary | ICD-10-CM

## 2019-01-19 DIAGNOSIS — F17200 Nicotine dependence, unspecified, uncomplicated: Secondary | ICD-10-CM | POA: Diagnosis present

## 2019-01-19 DIAGNOSIS — Z20828 Contact with and (suspected) exposure to other viral communicable diseases: Secondary | ICD-10-CM | POA: Diagnosis present

## 2019-01-19 DIAGNOSIS — I8510 Secondary esophageal varices without bleeding: Secondary | ICD-10-CM | POA: Diagnosis present

## 2019-01-19 DIAGNOSIS — R0602 Shortness of breath: Secondary | ICD-10-CM | POA: Diagnosis not present

## 2019-01-19 DIAGNOSIS — Z9114 Patient's other noncompliance with medication regimen: Secondary | ICD-10-CM

## 2019-01-19 DIAGNOSIS — R0600 Dyspnea, unspecified: Secondary | ICD-10-CM

## 2019-01-19 DIAGNOSIS — F17210 Nicotine dependence, cigarettes, uncomplicated: Secondary | ICD-10-CM | POA: Diagnosis present

## 2019-01-19 LAB — COMPREHENSIVE METABOLIC PANEL
ALT: 25 U/L (ref 0–44)
AST: 72 U/L — ABNORMAL HIGH (ref 15–41)
Albumin: 2.6 g/dL — ABNORMAL LOW (ref 3.5–5.0)
Alkaline Phosphatase: 187 U/L — ABNORMAL HIGH (ref 38–126)
Anion gap: 11 (ref 5–15)
BUN: 5 mg/dL — ABNORMAL LOW (ref 6–20)
CO2: 22 mmol/L (ref 22–32)
Calcium: 8.9 mg/dL (ref 8.9–10.3)
Chloride: 104 mmol/L (ref 98–111)
Creatinine, Ser: 0.68 mg/dL (ref 0.44–1.00)
GFR calc Af Amer: >60 mL/min (ref >60)
GFR calc non Af Amer: >60 mL/min (ref >60)
Glucose, Bld: 81 mg/dL (ref 70–99)
Potassium: 3.4 mmol/L — ABNORMAL LOW (ref 3.5–5.1)
Sodium: 137 mmol/L (ref 135–145)
Total Bilirubin: 4.8 mg/dL — ABNORMAL HIGH (ref 0.3–1.2)
Total Protein: 7.1 g/dL (ref 6.5–8.1)

## 2019-01-19 LAB — CBC
HCT: 35.3 % — ABNORMAL LOW (ref 36.0–46.0)
Hemoglobin: 12 g/dL (ref 12.0–15.0)
MCH: 28.1 pg (ref 26.0–34.0)
MCHC: 34 g/dL (ref 30.0–36.0)
MCV: 82.7 fL (ref 80.0–100.0)
Platelets: 82 10*3/uL — ABNORMAL LOW (ref 150–400)
RBC: 4.27 MIL/uL (ref 3.87–5.11)
RDW: 22 % — ABNORMAL HIGH (ref 11.5–15.5)
WBC: 3.9 10*3/uL — ABNORMAL LOW (ref 4.0–10.5)
nRBC: 0 % (ref 0.0–0.2)

## 2019-01-19 LAB — URINALYSIS, ROUTINE W REFLEX MICROSCOPIC
Glucose, UA: 100 mg/dL — AB
Hgb urine dipstick: NEGATIVE
Ketones, ur: 15 mg/dL — AB
Nitrite: POSITIVE — AB
Protein, ur: 100 mg/dL — AB
Specific Gravity, Urine: 1.025 (ref 1.005–1.030)
pH: 6.5 (ref 5.0–8.0)

## 2019-01-19 LAB — URINALYSIS, MICROSCOPIC (REFLEX)

## 2019-01-19 LAB — LIPASE, BLOOD: Lipase: 21 U/L (ref 11–51)

## 2019-01-19 MED ORDER — SODIUM CHLORIDE 0.9% FLUSH
3.0000 mL | Freq: Once | INTRAVENOUS | Status: AC
  Administered 2019-01-20: 3 mL via INTRAVENOUS

## 2019-01-19 MED ORDER — ONDANSETRON 4 MG PO TBDP
4.0000 mg | ORAL_TABLET | Freq: Once | ORAL | Status: AC
  Administered 2019-01-20: 01:00:00 4 mg via ORAL
  Filled 2019-01-19: qty 1

## 2019-01-19 NOTE — Telephone Encounter (Signed)
Pt is scheduled for OV tomorrow but reported difficulty breathing and muscle tightness.

## 2019-01-19 NOTE — ED Provider Notes (Signed)
[facility] EMERGENCY DEPARTMENT Provider Note   CSN: [id number] Arrival date & time: 01/19/19  1652    History   Chief Complaint Chief Complaint  Patient presents with  . Abdominal Pain    HPI Katie Holland is a 53 y.o. female with a history of decompensated cirrhosis due to ethanol complicated by esophageal varices s/p band and ascites requiring therapeutic paracentesis who presents to the emergency department with a chief complaint of shortness of breath.  The patient endorses constant, worsening shortness of breath over the last 2 weeks.  She reports that she is established with Dr. Williemae Area or with GI and was scheduled to have an upcoming paracentesis, but was advised to come to the ER after shortness of breath significantly worsened today.    She feels as if her shortness of breath is due to worsening abdominal swelling.  Last paracentesis was May 7. Per chart review, she has been seen 7 times this year for therapeutic paracentesis removing between 1.9 and 9.1 L in each setting.  She also went underwent a thoracentesis in June.  She denies chest pain, but does note that she has been having a cough that is mostly been dry, but with intermittent clear sputum.  She has been having nausea and retching, but no vomiting.  She denies diarrhea, vaginal bleeding, pain, or discharge, dysuria, or urinary frequency.  No melena or hematochezia.  No change in urinary output.  She reports that she has been feeling generally weak and tired. Temperature on arrival to the ER was 100.2 orally, but she denies fever or chills at home.  She was tested for COVID-19 earlier this week due to upcoming scheduled paracentesis.     The history is provided by the patient. No language interpreter was used.    Past Medical History:  Diagnosis Date  . Alcoholic liver disease ([place])   . Chronic back pain    on disability  . Cirrhosis of liver ([place])   . Esophageal varices ([place])   . GERD  (gastroesophageal reflux disease)   . Jaundice     Patient Active Problem List   Diagnosis Date Noted  . Ascites 01/20/2019  . Acute upper GI bleeding 06/02/2018  . Alcohol dependence syndrome ([place]) 06/02/2018  . Decompensated hepatic cirrhosis ([place])   . Esophageal varices ([place])   . Alcoholic liver disease ([place])   . Protein-calorie malnutrition, severe 04/26/2018  . Bleeding esophageal varices ([place])   . Abdominal pain 04/24/2018  . Hematemesis 04/24/2018  . Hematochezia 04/24/2018  . Alcohol abuse 04/24/2018  . Hyponatremia 04/24/2018  . Hypokalemia 04/24/2018  . Metabolic acidosis, normal anion gap (NAG) 04/24/2018  . History of GI bleed 10/26/2017  . Alcoholic cirrhosis of liver with ascites ([place]) 10/26/2017  . Tobacco dependence 10/26/2017    Past Surgical History:  Procedure Laterality Date  . BACK SURGERY    . ESOPHAGEAL BANDING  04/25/2018   Procedure: ESOPHAGEAL BANDING;  Surgeon: Yetta Flock, MD;  Location: [facility] ENDOSCOPY;  Service: Gastroenterology;;  . ESOPHAGEAL BANDING  04/27/2018   Procedure: ESOPHAGEAL BANDING;  Surgeon: Milus Banister, MD;  Location: [facility];  Service: Endoscopy;;  . ESOPHAGEAL BANDING  06/02/2018   Procedure: ESOPHAGEAL BANDING;  Surgeon: Doran Stabler, MD;  Location: [facility];  Service: Gastroenterology;;  . ESOPHAGEAL BANDING N/A 07/07/2018   Procedure: ESOPHAGEAL BANDING;  Surgeon: Yetta Flock, MD;  Location: [facility] ENDOSCOPY;  Service: Gastroenterology;  Laterality: N/A;  . ESOPHAGEAL BANDING  07/27/2018  Procedure: ESOPHAGEAL BANDING;  Surgeon: Yetta Flock, MD;  Location: [facility] ENDOSCOPY;  Service: Gastroenterology;;  . ESOPHAGEAL BANDING  08/30/2018   Procedure: ESOPHAGEAL BANDING;  Surgeon: Yetta Flock, MD;  Location: [facility] ENDOSCOPY;  Service: Gastroenterology;;  . ESOPHAGOGASTRODUODENOSCOPY N/A 04/27/2018   Procedure: ESOPHAGOGASTRODUODENOSCOPY (EGD);  Surgeon: Milus Banister, MD;  Location: [facility]  ENDOSCOPY;  Service: Endoscopy;  Laterality: N/A;  . ESOPHAGOGASTRODUODENOSCOPY (EGD) WITH PROPOFOL N/A 04/25/2018   Procedure: ESOPHAGOGASTRODUODENOSCOPY (EGD) WITH PROPOFOL;  Surgeon: Yetta Flock, MD;  Location: [place] [facility];  Service: Gastroenterology;  Laterality: N/A;  . ESOPHAGOGASTRODUODENOSCOPY (EGD) WITH PROPOFOL N/A 06/02/2018   Procedure: ESOPHAGOGASTRODUODENOSCOPY (EGD) WITH PROPOFOL;  Surgeon: Doran Stabler, MD;  Location: [facility];  Service: Gastroenterology;  Laterality: N/A;  . ESOPHAGOGASTRODUODENOSCOPY (EGD) WITH PROPOFOL N/A 07/07/2018   Procedure: ESOPHAGOGASTRODUODENOSCOPY (EGD) WITH PROPOFOL;  Surgeon: Yetta Flock, MD;  Location: [facility] ENDOSCOPY;  Service: Gastroenterology;  Laterality: N/A;  . ESOPHAGOGASTRODUODENOSCOPY (EGD) WITH PROPOFOL N/A 07/27/2018   Procedure: ESOPHAGOGASTRODUODENOSCOPY (EGD) WITH PROPOFOL;  Surgeon: Yetta Flock, MD;  Location: [facility] ENDOSCOPY;  Service: Gastroenterology;  Laterality: N/A;  . ESOPHAGOGASTRODUODENOSCOPY (EGD) WITH PROPOFOL N/A 08/30/2018   Procedure: ESOPHAGOGASTRODUODENOSCOPY (EGD) WITH PROPOFOL;  Surgeon: Yetta Flock, MD;  Location: [facility] ENDOSCOPY;  Service: Gastroenterology;  Laterality: N/A;  . ESOPHAGOGASTRODUODENOSCOPY (EGD) WITH PROPOFOL N/A 11/08/2018   Procedure: ESOPHAGOGASTRODUODENOSCOPY (EGD) WITH PROPOFOL;  Surgeon: Yetta Flock, MD;  Location: [facility] ENDOSCOPY;  Service: Gastroenterology;  Laterality: N/A;  . IR PARACENTESIS  04/26/2018  . IR PARACENTESIS  05/18/2018  . IR PARACENTESIS  05/31/2018  . IR PARACENTESIS  06/30/2018  . IR PARACENTESIS  08/05/2018  . IR PARACENTESIS  08/31/2018  . IR PARACENTESIS  10/08/2018  . IR PARACENTESIS  10/28/2018  . IR RADIOLOGIST EVAL & MGMT  12/23/2018  . IR THORACENTESIS ASP PLEURAL SPACE W/IMG GUIDE  12/10/2018     OB History   No obstetric history on file.      Home Medications    Prior to Admission medications   Medication Sig Start Date End  Date Taking? Authorizing Provider  acetaminophen (TYLENOL) 500 MG tablet Take 1,000 mg by mouth every 6 (six) hours as needed for moderate pain or headache.    [provider]  furosemide (LASIX) 20 MG tablet Take 2 tablets (40 mg total) by mouth 2 (two) times daily. Patient not taking: Reported on 11/01/2018 10/11/18   Yetta Flock, MD  Lactulose 20 GM/30ML SOLN Take 45 mLs (30 g total) by mouth 3 (three) times daily. Take 30 mls every eight hours. Titrate for 3 bm per 24 hrs Patient taking differently: Take 30 g by mouth 3 (three) times daily.  05/27/18   [place]wood, Amy S, PA-C  Multiple Vitamin (MULTIVITAMIN WITH MINERALS) TABS tablet Take 1 tablet by mouth daily.    [provider]  potassium chloride (K-DUR) 10 MEQ tablet Take 1 tablet (10 mEq total) by mouth daily. Patient not taking: Reported on 11/01/2018 10/11/18   Yetta Flock, MD  rifaximin (XIFAXAN) 550 MG TABS tablet Take 1 tablet (550 mg total) by mouth 2 (two) times daily. 05/27/18   [place]wood, Amy S, PA-C  spironolactone (ALDACTONE) 100 MG tablet Take 2 tablets (200 mg total) by mouth daily. 10/11/18   Armbruster, Carlota Raspberry, MD    Family History Family History  Problem Relation Age of Onset  . Colon cancer Neg Hx   . Esophageal cancer Neg Hx     Social History Social  History   Tobacco Use  . Smoking status: Current Every Day Smoker    Packs/day: 0.75    Years: 20.00    Pack years: 15.00    Types: Cigarettes  . Smokeless tobacco: Never Used  Substance Use Topics  . Alcohol use: Not Currently    Comment: denies h/o withdrawal, quit 05/23/2018  . Drug use: Not Currently     Allergies   Contrast media [iodinated diagnostic agents], Latex, and Sulfa antibiotics   Review of Systems Review of Systems  Constitutional: Positive for fatigue. Negative for activity change, chills and fever.  Eyes: Negative for visual disturbance.  Respiratory: Positive for cough and shortness of breath.    Cardiovascular: Negative for chest pain.  Gastrointestinal: Positive for abdominal distention and nausea. Negative for abdominal pain, blood in stool, constipation, diarrhea and vomiting.  Genitourinary: Negative for decreased urine volume, difficulty urinating, dysuria, enuresis, frequency, hematuria, urgency, vaginal discharge and vaginal pain.  Musculoskeletal: Negative for back pain, myalgias, neck pain and neck stiffness.  Skin: Negative for rash.  Allergic/Immunologic: Negative for immunocompromised state.  Neurological: Negative for dizziness, syncope, weakness, light-headedness and headaches.  Psychiatric/Behavioral: Negative for confusion.     Physical Exam Updated Vital Signs BP (!) 136/94   Pulse (!) 106   Temp 98.8 F (37.1 C) (Rectal)   Resp 14   SpO2 96%   Physical Exam Vitals signs and nursing note reviewed.  Constitutional:      General: She is not in acute distress.    Appearance: She is not toxic-appearing.  HENT:     Head: Normocephalic.  Eyes:     General: Scleral icterus present.     Conjunctiva/sclera: Conjunctivae normal.  Neck:     Musculoskeletal: Normal range of motion and neck supple.  Cardiovascular:     Rate and Rhythm: Regular rhythm. Tachycardia present.     Heart sounds: No murmur. No friction rub. No gallop.   Pulmonary:     Effort: Pulmonary effort is normal. No respiratory distress.     Breath sounds: No stridor. No wheezing.     Comments: Crackles in the right base.  Lungs are otherwise clear auscultation bilaterally.  No accessory muscle use or retractions.  Minimal increased work of breathing. Abdominal:     General: There is distension.     Palpations: Abdomen is soft. There is no mass.     Tenderness: There is no abdominal tenderness. There is no right CVA tenderness, left CVA tenderness, guarding or rebound.     Hernia: No hernia is present.     Comments: Abdomen is very distended, but soft.  Positive fluid wave.  No tenderness to  palpation throughout the abdomen.  No rebound or guarding.  Musculoskeletal:     Right lower leg: No edema.     Left lower leg: No edema.  Skin:    General: Skin is warm.     Coloration: Skin is not jaundiced.     Findings: No rash.  Neurological:     Mental Status: She is alert.  Psychiatric:        Behavior: Behavior normal.      ED Treatments / Results  Labs (all labs ordered are listed, but only abnormal results are displayed) Labs Reviewed  COMPREHENSIVE METABOLIC PANEL - Abnormal; Notable for the following components:      Result Value   Potassium 3.4 (*)    BUN 5 (*)    Albumin 2.6 (*)    AST 72 (*)  Alkaline Phosphatase 187 (*)    Total Bilirubin 4.8 (*)    All other components within normal limits  CBC - Abnormal; Notable for the following components:   WBC 3.9 (*)    HCT 35.3 (*)    RDW 22.0 (*)    Platelets 82 (*)    All other components within normal limits  URINALYSIS, ROUTINE W REFLEX MICROSCOPIC - Abnormal; Notable for the following components:   Color, Urine ORANGE (*)    APPearance TURBID (*)    Glucose, UA 100 (*)    Bilirubin Urine LARGE (*)    Ketones, ur 15 (*)    Protein, ur 100 (*)    Nitrite POSITIVE (*)    Leukocytes,Ua TRACE (*)    All other components within normal limits  URINALYSIS, MICROSCOPIC (REFLEX) - Abnormal; Notable for the following components:   Bacteria, UA MANY (*)    All other components within normal limits  URINE CULTURE  SARS CORONAVIRUS 2 (HOSPITAL ORDER, [place] [facility])  LIPASE, BLOOD  PROTIME-INR  CBC  MAGNESIUM  COMPREHENSIVE METABOLIC PANEL    EKG None  Radiology Dg Chest Portable 1 View  Result Date: 01/20/2019 CLINICAL DATA:  Cirrhosis, abdominal swelling/pain, shortness of breath EXAM: PORTABLE CHEST 1 VIEW COMPARISON:  12/10/2018 and 06/04/2018 FINDINGS: Moderate right pleural effusion, new. Left lung is clear. No pneumothorax. The heart is normal in size. Sclerotic lesion  along the right scapula, unchanged from 2019. IMPRESSION: Moderate right pleural effusion, new. Electronically Signed   By: Julian Hy M.D.   On: 01/20/2019 00:20    Procedures Procedures (including critical care time)  Medications Ordered in ED Medications  sodium chloride flush (NS) 0.9 % injection 3 mL (has no administration in time range)  acetaminophen (TYLENOL) tablet 650 mg (has no administration in time range)    Or  acetaminophen (TYLENOL) suppository 650 mg (has no administration in time range)  nicotine (NICODERM CQ - dosed in mg/24 hours) patch 14 mg (has no administration in time range)  potassium chloride SA (K-DUR) CR tablet 20 mEq (has no administration in time range)  cefTRIAXone (ROCEPHIN) 1 g in sodium chloride 0.9 % 100 mL IVPB (has no administration in time range)  furosemide (LASIX) tablet 40 mg (has no administration in time range)  rifaximin (XIFAXAN) tablet 550 mg (has no administration in time range)  spironolactone (ALDACTONE) tablet 200 mg (has no administration in time range)  ondansetron (ZOFRAN-ODT) disintegrating tablet 4 mg (4 mg Oral Given 01/20/19 0041)  cefTRIAXone (ROCEPHIN) 1 g in sodium chloride 0.9 % 100 mL IVPB (1 g Intravenous New Bag/Given 01/20/19 0053)  acetaminophen (TYLENOL) tablet 650 mg (650 mg Oral Given 01/20/19 0117)     Initial Impression / Assessment and Plan / ED Course  I have reviewed the triage vital signs and the nursing notes.  Pertinent labs & imaging results that were available during my care of the patient were reviewed by me and considered in my medical decision making (see chart for details).        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  She was scheduled for a therapeutic paracentesis this week, but spoke  with gastroenterology earlier today was advised to come to the ER if shortness of breath significantly worsens.  She is mildly tachycardic on arrival to the ER.  No tachypnea or hypoxia.  Oral temp was 100.2 on arrival, she denies constitutional symptoms at home.  Repeat oral temps have been normal and rectal temp was normal at 98.2 after not being treated with antipyretics.   She is having no abdominal pain.  Abdomen is very distended and she has a positive fluid wave, but is soft.  No tenderness and no peritoneal signs.  I have a low suspicion for SBP at this time.  She is also previously had esophageal variceal bleeds, but is having no melena or hematochezia.   Labs are minimally changed from previous.  UA with nitrates and trace leukocyte esterase.  This could be secondary to mild urinary retention secondary to worsening ascites, but patient denies changes to urine output.  We will give the patient a dose of Rocephin in the ER and send urine culture.  Chest x-ray with moderate right pleural effusion, which is new.  She did require thoracentesis in June also on the right.  Initially considered bedside therapeutic paracentesis, but given new pleural effusion she may also require a repeat thoracentesis.  This is likely the etiology of her dyspnea.  The patient was discussed and independently evaluated by Dr. Dayna Barker, attending physician.  COVID-19 test is pending.  Consult to the hospitalist team and Dr. Marlowe Sax will accept the patient for admission.  I also contacted the IR PA desk and left a message for call back regarding the patient's likely need for thoracentesis and paracentesis.  Dr. Marlowe Sax has been made aware.  The patient appears reasonably stabilized for admission considering the current resources, flow, and capabilities available in the ED at this time, and I doubt any other [facility] requiring further screening and/or treatment in the ED prior to admission.  Final Clinical Impressions(s) / ED  Diagnoses   Final diagnoses:  Ascites due to alcoholic cirrhosis ([place])  Recurrent pleural effusion on right    ED Discharge Orders    None       Joanne Gavel, PA-C 01/20/19 0230    Mesner, Corene Cornea, MD 01/21/19 [id number]

## 2019-01-19 NOTE — Telephone Encounter (Signed)
Patient called back and said she feels very SOB because of all the fluid in her abdomin pushing up on her chest. Stated her daughter is on the way to take her to the ED. She has an appt. With Dr. Havery Moros tomorrow and may not make it.

## 2019-01-19 NOTE — Telephone Encounter (Signed)
Returned call to patient's phone# and daughter's phone#, got voice mails. Left messages on both #s to please call back

## 2019-01-19 NOTE — Telephone Encounter (Signed)
Okay. She probably needs a paracentesis and I suspect that would make her feel better acutely. If she is in distress with shortness of breath she should go to the ED to get this done urgently, if more chronic symptoms and she's stable I can assess her tomorrow in the clinic.

## 2019-01-19 NOTE — Telephone Encounter (Signed)
Patient daughter is returning your call and would like to speak to esther.

## 2019-01-19 NOTE — ED Triage Notes (Signed)
Pt arrives to ED with significant swelling in abd from chronic cirrhosis swelling and pain has been getting worse over there last week and is making it difficult to take a deep breath.

## 2019-01-19 NOTE — Telephone Encounter (Signed)
Spoke to patient's daughter, said she is leaving work in a few mins. And would assess her Mom's condition when she gets there, and take her to the ED if needed.

## 2019-01-20 ENCOUNTER — Encounter (HOSPITAL_COMMUNITY): Payer: Self-pay | Admitting: General Practice

## 2019-01-20 ENCOUNTER — Ambulatory Visit: Payer: Medicare Other | Admitting: Gastroenterology

## 2019-01-20 ENCOUNTER — Other Ambulatory Visit: Payer: Self-pay

## 2019-01-20 DIAGNOSIS — G8929 Other chronic pain: Secondary | ICD-10-CM | POA: Diagnosis present

## 2019-01-20 DIAGNOSIS — J90 Pleural effusion, not elsewhere classified: Secondary | ICD-10-CM | POA: Diagnosis not present

## 2019-01-20 DIAGNOSIS — R188 Other ascites: Secondary | ICD-10-CM | POA: Diagnosis present

## 2019-01-20 DIAGNOSIS — M549 Dorsalgia, unspecified: Secondary | ICD-10-CM | POA: Diagnosis present

## 2019-01-20 DIAGNOSIS — K7290 Hepatic failure, unspecified without coma: Secondary | ICD-10-CM | POA: Diagnosis not present

## 2019-01-20 DIAGNOSIS — I8510 Secondary esophageal varices without bleeding: Secondary | ICD-10-CM | POA: Diagnosis present

## 2019-01-20 DIAGNOSIS — Z9114 Patient's other noncompliance with medication regimen: Secondary | ICD-10-CM | POA: Diagnosis not present

## 2019-01-20 DIAGNOSIS — N390 Urinary tract infection, site not specified: Secondary | ICD-10-CM | POA: Diagnosis present

## 2019-01-20 DIAGNOSIS — Z20828 Contact with and (suspected) exposure to other viral communicable diseases: Secondary | ICD-10-CM | POA: Diagnosis present

## 2019-01-20 DIAGNOSIS — Z79899 Other long term (current) drug therapy: Secondary | ICD-10-CM | POA: Diagnosis not present

## 2019-01-20 DIAGNOSIS — E876 Hypokalemia: Secondary | ICD-10-CM | POA: Diagnosis present

## 2019-01-20 DIAGNOSIS — K766 Portal hypertension: Secondary | ICD-10-CM | POA: Diagnosis present

## 2019-01-20 DIAGNOSIS — F17210 Nicotine dependence, cigarettes, uncomplicated: Secondary | ICD-10-CM | POA: Diagnosis present

## 2019-01-20 DIAGNOSIS — K219 Gastro-esophageal reflux disease without esophagitis: Secondary | ICD-10-CM | POA: Diagnosis present

## 2019-01-20 DIAGNOSIS — K7031 Alcoholic cirrhosis of liver with ascites: Secondary | ICD-10-CM | POA: Diagnosis present

## 2019-01-20 LAB — CBC
HCT: 32.2 % — ABNORMAL LOW (ref 36.0–46.0)
Hemoglobin: 11.2 g/dL — ABNORMAL LOW (ref 12.0–15.0)
MCH: 28.4 pg (ref 26.0–34.0)
MCHC: 34.8 g/dL (ref 30.0–36.0)
MCV: 81.7 fL (ref 80.0–100.0)
Platelets: 68 10*3/uL — ABNORMAL LOW (ref 150–400)
RBC: 3.94 MIL/uL (ref 3.87–5.11)
RDW: 21.6 % — ABNORMAL HIGH (ref 11.5–15.5)
WBC: 4.1 10*3/uL (ref 4.0–10.5)
nRBC: 0 % (ref 0.0–0.2)

## 2019-01-20 LAB — COMPREHENSIVE METABOLIC PANEL
ALT: 22 U/L (ref 0–44)
AST: 59 U/L — ABNORMAL HIGH (ref 15–41)
Albumin: 2.3 g/dL — ABNORMAL LOW (ref 3.5–5.0)
Alkaline Phosphatase: 180 U/L — ABNORMAL HIGH (ref 38–126)
Anion gap: 8 (ref 5–15)
BUN: 7 mg/dL (ref 6–20)
CO2: 23 mmol/L (ref 22–32)
Calcium: 8.5 mg/dL — ABNORMAL LOW (ref 8.9–10.3)
Chloride: 106 mmol/L (ref 98–111)
Creatinine, Ser: 0.66 mg/dL (ref 0.44–1.00)
GFR calc Af Amer: >60 mL/min (ref >60)
GFR calc non Af Amer: >60 mL/min (ref >60)
Glucose, Bld: 68 mg/dL — ABNORMAL LOW (ref 70–99)
Potassium: 3.2 mmol/L — ABNORMAL LOW (ref 3.5–5.1)
Sodium: 137 mmol/L (ref 135–145)
Total Bilirubin: 4.3 mg/dL — ABNORMAL HIGH (ref 0.3–1.2)
Total Protein: 6.2 g/dL — ABNORMAL LOW (ref 6.5–8.1)

## 2019-01-20 LAB — GLUCOSE, CAPILLARY
Glucose-Capillary: 64 mg/dL — ABNORMAL LOW (ref 70–99)
Glucose-Capillary: 131 mg/dL — ABNORMAL HIGH (ref 70–99)
Glucose-Capillary: 195 mg/dL — ABNORMAL HIGH (ref 70–99)

## 2019-01-20 LAB — URINE CULTURE

## 2019-01-20 LAB — CBG MONITORING, ED
Glucose-Capillary: 53 mg/dL — ABNORMAL LOW (ref 70–99)
Glucose-Capillary: 94 mg/dL (ref 70–99)

## 2019-01-20 LAB — MAGNESIUM: Magnesium: 1.7 mg/dL (ref 1.7–2.4)

## 2019-01-20 LAB — PROTIME-INR
INR: 1.6 — ABNORMAL HIGH (ref 0.8–1.2)
Prothrombin Time: 19 seconds — ABNORMAL HIGH (ref 11.4–15.2)

## 2019-01-20 LAB — SARS CORONAVIRUS 2 BY RT PCR (HOSPITAL ORDER, PERFORMED IN ~~LOC~~ HOSPITAL LAB): SARS Coronavirus 2: NEGATIVE

## 2019-01-20 MED ORDER — TRAMADOL HCL 50 MG PO TABS
50.0000 mg | ORAL_TABLET | Freq: Four times a day (QID) | ORAL | Status: DC | PRN
  Administered 2019-01-21: 50 mg via ORAL
  Filled 2019-01-20: qty 1

## 2019-01-20 MED ORDER — SODIUM CHLORIDE 0.9 % IV SOLN
1.0000 g | INTRAVENOUS | Status: DC
  Administered 2019-01-21 – 2019-01-23 (×3): 1 g via INTRAVENOUS
  Filled 2019-01-20 (×2): qty 1
  Filled 2019-01-20: qty 10
  Filled 2019-01-20: qty 1

## 2019-01-20 MED ORDER — FUROSEMIDE 40 MG PO TABS
40.0000 mg | ORAL_TABLET | Freq: Two times a day (BID) | ORAL | Status: DC
  Administered 2019-01-20 – 2019-01-23 (×5): 40 mg via ORAL
  Filled 2019-01-20 (×5): qty 1

## 2019-01-20 MED ORDER — SODIUM CHLORIDE 0.9 % IV SOLN
1.0000 g | Freq: Once | INTRAVENOUS | Status: AC
  Administered 2019-01-20: 1 g via INTRAVENOUS
  Filled 2019-01-20: qty 10

## 2019-01-20 MED ORDER — POTASSIUM CHLORIDE CRYS ER 20 MEQ PO TBCR
20.0000 meq | EXTENDED_RELEASE_TABLET | Freq: Once | ORAL | Status: AC
  Administered 2019-01-20: 20 meq via ORAL
  Filled 2019-01-20: qty 1

## 2019-01-20 MED ORDER — ACETAMINOPHEN 650 MG RE SUPP: 650.0000 mg | Freq: Four times a day (QID) | RECTAL | Status: DC | PRN

## 2019-01-20 MED ORDER — NICOTINE 14 MG/24HR TD PT24
14.0000 mg | MEDICATED_PATCH | Freq: Every day | TRANSDERMAL | Status: DC
  Administered 2019-01-20 – 2019-01-23 (×4): 14 mg via TRANSDERMAL
  Filled 2019-01-20 (×4): qty 1

## 2019-01-20 MED ORDER — LACTULOSE 10 GM/15ML PO SOLN
30.0000 g | Freq: Three times a day (TID) | ORAL | Status: DC
  Administered 2019-01-20 – 2019-01-23 (×9): 30 g via ORAL
  Filled 2019-01-20 (×12): qty 45

## 2019-01-20 MED ORDER — RIFAXIMIN 550 MG PO TABS
550.0000 mg | ORAL_TABLET | Freq: Two times a day (BID) | ORAL | Status: DC
  Administered 2019-01-20 – 2019-01-23 (×6): 550 mg via ORAL
  Filled 2019-01-20 (×9): qty 1

## 2019-01-20 MED ORDER — DEXTROSE 50 % IV SOLN
INTRAVENOUS | Status: AC
  Administered 2019-01-20: 50 mL
  Filled 2019-01-20: qty 50

## 2019-01-20 MED ORDER — SPIRONOLACTONE 25 MG PO TABS
200.0000 mg | ORAL_TABLET | Freq: Every day | ORAL | Status: DC
  Administered 2019-01-21 – 2019-01-23 (×3): 200 mg via ORAL
  Filled 2019-01-20: qty 2
  Filled 2019-01-20 (×3): qty 8

## 2019-01-20 MED ORDER — ACETAMINOPHEN 325 MG PO TABS
650.0000 mg | ORAL_TABLET | Freq: Four times a day (QID) | ORAL | Status: DC | PRN
  Administered 2019-01-21: 650 mg via ORAL
  Filled 2019-01-20: qty 2

## 2019-01-20 MED ORDER — ACETAMINOPHEN 325 MG PO TABS
650.0000 mg | ORAL_TABLET | Freq: Once | ORAL | Status: AC
  Administered 2019-01-20: 01:00:00 650 mg via ORAL
  Filled 2019-01-20: qty 2

## 2019-01-20 MED ORDER — DEXTROSE 50 % IV SOLN
INTRAVENOUS | Status: AC
  Filled 2019-01-20: qty 50

## 2019-01-20 MED ORDER — SODIUM CHLORIDE 0.9 % IV SOLN: 1.0000 g | Freq: Once | INTRAVENOUS | Status: DC

## 2019-01-20 MED ORDER — DEXTROSE 50 % IV SOLN
25.0000 g | INTRAVENOUS | Status: AC
  Administered 2019-01-20: 25 g via INTRAVENOUS

## 2019-01-20 NOTE — ED Notes (Signed)
X-ray at bedside

## 2019-01-20 NOTE — Progress Notes (Signed)
   PROGRESS NOTE    Katie Holland  JKK:[id number] DOB: January 21, 1966 DOA: 01/19/2019 PCP: Billie Ruddy, MD   Brief Narrative:  Patient admitted early this morning, please see H&P on today's date for full history and HPI.  Briefly patient admitted for worsening abdominal distention shortness of breath in the setting of hepatic cirrhosis found to have been moderately noncompliant with home medication regimen including Lasix, spironolactone, rifaximin, lactulose for around 3 weeks.  IR consulted for right thoracentesis as well as paracentesis given patient's ascites and pleural effusion noted on imaging.  Questionable UTI on intake, continue ceftriaxone. Patient to be monitored overnight with likely disposition back home in the next 24 to 48 hours pending clinical course.  Assessment & Plan:   Principal Problem:   Decompensated hepatic cirrhosis (HCC) Active Problems:   Tobacco dependence   Ascites   Pleural effusion   UTI (urinary tract infection)   Scheduled Meds: . dextrose      . furosemide  40 mg Oral BID  . lactulose  30 g Oral TID  . nicotine  14 mg Transdermal Daily  . rifaximin  550 mg Oral BID  . sodium chloride flush  3 mL Intravenous Once  . spironolactone  200 mg Oral Daily   Continuous Infusions: . [START ON 01/21/2019] cefTRIAXone (ROCEPHIN)  IV       LOS: 0 days    Time spent: [place], DO [facility]  If 7PM-7AM, please contact night-coverage [email or web address] Password [email or web address] 01/20/2019, 1:16 PM

## 2019-01-20 NOTE — Telephone Encounter (Signed)
Spoke to daughter and her Mom did get admitted to [facility]. She has not heard from the Dr. yet this morning to know the plan

## 2019-01-20 NOTE — ED Notes (Signed)
PAGED ADMITTING PER RN  

## 2019-01-20 NOTE — Telephone Encounter (Signed)
Thanks Sherlynn Stalls, she's getting paracentesis / thoracentesis, being managed by inpatient service. She will need a follow up visit with me once she's out of the hospital. Thanks

## 2019-01-20 NOTE — ED Notes (Signed)
ED TO INPATIENT HANDOFF REPORT  ED Nurse Name and Phone #: [phone]  S Name/Age/Gender Katie Holland 53 y.o. female Room/Bed: 033C/033C  Code Status   Code Status: Full Code  Home/SNF/Other Home Patient oriented to: self, place, time and situation Is this baseline? Yes   Triage Complete: Triage complete  Chief Complaint SOB  Triage Note Pt arrives to ED with significant swelling in abd from chronic cirrhosis swelling and pain has been getting worse over there last week and is making it difficult to take a deep breath.    Allergies Allergies  Allergen Reactions  . Contrast Media [Iodinated Diagnostic Agents] Itching and Swelling    Needs 13-hr prep  . Latex Itching, Swelling and Other (See Comments)    No breathing impairment, however  . Sulfa Antibiotics Itching, Swelling and Other (See Comments)    No breathing impairment, however    Level of Care/Admitting Diagnosis ED Disposition    ED Disposition Condition Comment   Admit  Hospital Area: [place] [[id number]]  Level of Care: Telemetry Medical [104]  I expect the patient will be discharged within 24 hours: No (not a candidate for 5C-Observation unit)  Covid Evaluation: Person Under Investigation (PUI)  Diagnosis: Ascites [409735]  Admitting Physician: Shela Leff [[id number]]  Attending Physician: Shela Leff [[id number]]  PT Class (Do Not Modify): Observation [104]  PT Acc Code (Do Not Modify): Observation [10022]       B Medical/Surgery History Past Medical History:  Diagnosis Date  . Alcoholic liver disease ([place])   . Chronic back pain    on disability  . Cirrhosis of liver ([place])   . Esophageal varices ([place])   . GERD (gastroesophageal reflux disease)   . Jaundice    Past Surgical History:  Procedure Laterality Date  . BACK SURGERY    . ESOPHAGEAL BANDING  04/25/2018   Procedure: ESOPHAGEAL BANDING;  Surgeon: Yetta Flock, MD;  Location: [facility] ENDOSCOPY;  Service:  Gastroenterology;;  . ESOPHAGEAL BANDING  04/27/2018   Procedure: ESOPHAGEAL BANDING;  Surgeon: Milus Banister, MD;  Location: [place];  Service: Endoscopy;;  . ESOPHAGEAL BANDING  06/02/2018   Procedure: ESOPHAGEAL BANDING;  Surgeon: Doran Stabler, MD;  Location: [place];  Service: Gastroenterology;;  . ESOPHAGEAL BANDING N/A 07/07/2018   Procedure: ESOPHAGEAL BANDING;  Surgeon: Yetta Flock, MD;  Location: [facility] ENDOSCOPY;  Service: Gastroenterology;  Laterality: N/A;  . ESOPHAGEAL BANDING  07/27/2018   Procedure: ESOPHAGEAL BANDING;  Surgeon: Yetta Flock, MD;  Location: [facility] ENDOSCOPY;  Service: Gastroenterology;;  . ESOPHAGEAL BANDING  08/30/2018   Procedure: ESOPHAGEAL BANDING;  Surgeon: Yetta Flock, MD;  Location: [facility] ENDOSCOPY;  Service: Gastroenterology;;  . ESOPHAGOGASTRODUODENOSCOPY N/A 04/27/2018   Procedure: ESOPHAGOGASTRODUODENOSCOPY (EGD);  Surgeon: Milus Banister, MD;  Location[facility] ENDOSCOPY;  Service: Endoscopy;  Laterality: N/A;  . ESOPHAGOGASTRODUODENOSCOPY (EGD) WITH PROPOFOL N/A 04/25/2018   Procedure: ESOPHAGOGASTRODUODENOSCOPY (EGD) WITH PROPOFOL;  Surgeon: Yetta Flock, MD;  Location: [place];  Service: Gastroenterology;  Laterality: N/A;  . ESOPHAGOGASTRODUODENOSCOPY (EGD) WITH PROPOFOL N/A 06/02/2018   Procedure: ESOPHAGOGASTRODUODENOSCOPY (EGD) WITH PROPOFOL;  Surgeon: Doran Stabler, MD;  Location: [place];  Service: Gastroenterology;  Laterality: N/A;  . ESOPHAGOGASTRODUODENOSCOPY (EGD) WITH PROPOFOL N/A 07/07/2018   Procedure: ESOPHAGOGASTRODUODENOSCOPY (EGD) WITH PROPOFOL;  Surgeon: Yetta Flock, MD;  Location: [facility] ENDOSCOPY;  Service: Gastroenterology;  Laterality: N/A;  . ESOPHAGOGASTRODUODENOSCOPY (EGD) WITH PROPOFOL N/A 07/27/2018   Procedure: ESOPHAGOGASTRODUODENOSCOPY (EGD) WITH PROPOFOL;  Surgeon: Havery Moros,  Carlota Raspberry, MD;  Location: [facility] ENDOSCOPY;  Service: Gastroenterology;  Laterality: N/A;  .  ESOPHAGOGASTRODUODENOSCOPY (EGD) WITH PROPOFOL N/A 08/30/2018   Procedure: ESOPHAGOGASTRODUODENOSCOPY (EGD) WITH PROPOFOL;  Surgeon: Yetta Flock, MD;  Location: [facility] ENDOSCOPY;  Service: Gastroenterology;  Laterality: N/A;  . ESOPHAGOGASTRODUODENOSCOPY (EGD) WITH PROPOFOL N/A 11/08/2018   Procedure: ESOPHAGOGASTRODUODENOSCOPY (EGD) WITH PROPOFOL;  Surgeon: Yetta Flock, MD;  Location: [facility] ENDOSCOPY;  Service: Gastroenterology;  Laterality: N/A;  . IR PARACENTESIS  04/26/2018  . IR PARACENTESIS  05/18/2018  . IR PARACENTESIS  05/31/2018  . IR PARACENTESIS  06/30/2018  . IR PARACENTESIS  08/05/2018  . IR PARACENTESIS  08/31/2018  . IR PARACENTESIS  10/08/2018  . IR PARACENTESIS  10/28/2018  . IR RADIOLOGIST EVAL & MGMT  12/23/2018  . IR THORACENTESIS ASP PLEURAL SPACE W/IMG GUIDE  12/10/2018     A IV Location/Drains/Wounds Patient Lines/Drains/Airways Status   Active Line/Drains/Airways    Name:   Placement date:   Placement time:   Site:   Days:   Peripheral IV 08/05/18 Right Antecubital   08/05/18    1130    Antecubital   168   Peripheral IV 08/31/18 Right Antecubital   08/31/18    0930    Antecubital   142   Peripheral IV 10/08/18 Left;Posterior Hand   10/08/18    1427    Hand   104   Peripheral IV 01/20/19 Left Antecubital   01/20/19    0045    Antecubital   less than 1          Intake/Output Last 24 hours No intake or output data in the 24 hours ending 01/20/19 1221  Labs/Imaging Results for orders placed or performed during the hospital encounter of 01/19/19 (from the past 48 hour(s))  Lipase, blood     Status: None   Collection Time: 01/19/19  5:02 PM  Result Value Ref Range   Lipase 21 11 - 51 U/L    Comment: Performed at [facility], [address], [place], [place] [address]  Comprehensive metabolic panel     Status: Abnormal   Collection Time: 01/19/19  5:02 PM  Result Value Ref Range   Sodium 137 135 - 145 mmol/L   Potassium 3.4 (L) 3.5 - 5.1 mmol/L    Chloride 104 98 - 111 mmol/L   CO2 22 22 - 32 mmol/L   Glucose, Bld 81 70 - 99 mg/dL   BUN 5 (L) 6 - 20 mg/dL   Creatinine, Ser 0.68 0.44 - 1.00 mg/dL   Calcium 8.9 8.9 - 10.3 mg/dL   Total Protein 7.1 6.5 - 8.1 g/dL   Albumin 2.6 (L) 3.5 - 5.0 g/dL   AST 72 (H) 15 - 41 U/L   ALT 25 0 - 44 U/L   Alkaline Phosphatase 187 (H) 38 - 126 U/L   Total Bilirubin 4.8 (H) 0.3 - 1.2 mg/dL   GFR calc non Af Amer >60 >60 mL/min   GFR calc Af Amer >60 >60 mL/min   Anion gap 11 5 - 15    Comment: Performed at [facility], [place] [address], [place], [place] [address]  CBC     Status: Abnormal   Collection Time: 01/19/19  5:02 PM  Result Value Ref Range   WBC 3.9 (L) 4.0 - 10.5 K/uL   RBC 4.27 3.87 - 5.11 MIL/uL   Hemoglobin 12.0 12.0 - 15.0 g/dL   HCT 35.3 (L) 36.0 - 46.0 %  MCV 82.7 80.0 - 100.0 fL   MCH 28.1 26.0 - 34.0 pg   MCHC 34.0 30.0 - 36.0 g/dL   RDW 22.0 (H) 11.5 - 15.5 %   Platelets 82 (L) 150 - 400 K/uL    Comment: REPEATED TO VERIFY PLATELET COUNT CONFIRMED BY SMEAR SPECIMEN CHECKED FOR CLOTS Immature Platelet Fraction may be clinically indicated, consider ordering this additional test [id number]    nRBC 0.0 0.0 - 0.2 %    Comment: Performed at [facility] [address] [place], [place] [address]  Urinalysis, Routine w reflex microscopic     Status: Abnormal   Collection Time: 01/19/19  8:30 PM  Result Value Ref Range   Color, Urine ORANGE (A) YELLOW    Comment: BIOCHEMICALS MAY BE AFFECTED BY COLOR   APPearance TURBID (A) CLEAR   Specific Gravity, Urine 1.025 1.005 - 1.030   pH 6.5 5.0 - 8.0   Glucose, UA 100 (A) NEGATIVE mg/dL   Hgb urine dipstick NEGATIVE NEGATIVE   Bilirubin Urine LARGE (A) NEGATIVE   Ketones, ur 15 (A) NEGATIVE mg/dL   Protein, ur 100 (A) NEGATIVE mg/dL   Nitrite POSITIVE (A) NEGATIVE   Leukocytes,Ua TRACE (A) NEGATIVE    Comment: Performed at [facility], [place] [address], [place], [place] [address]  Urinalysis, Microscopic  (reflex)     Status: Abnormal   Collection Time: 01/19/19  8:30 PM  Result Value Ref Range   RBC / HPF 0-5 0 - 5 RBC/hpf   WBC, UA 0-5 0 - 5 WBC/hpf   Bacteria, UA MANY (A) NONE SEEN   Squamous Epithelial / LPF 0-5 0 - 5    Comment: Performed at [facility], [place] [address], [place], [place] [address]  Magnesium     Status: None   Collection Time: 01/20/19  1:00 AM  Result Value Ref Range   Magnesium 1.7 1.7 - 2.4 mg/dL    Comment: Performed at [facility], [place] [address] [place], [place] [address]  SARS Coronavirus 2 (CEPHEID- Performed in [place] hospital lab), Hosp Order     Status: None   Collection Time: 01/20/19  1:02 AM   Specimen: Nasopharyngeal Swab  Result Value Ref Range   SARS Coronavirus 2 NEGATIVE NEGATIVE    Comment: (NOTE) If result is NEGATIVE SARS-CoV-2 target nucleic acids are NOT DETECTED. The SARS-CoV-2 RNA is generally detectable in upper and lower  respiratory specimens during the acute phase of infection. The lowest  concentration of SARS-CoV-2 viral copies this assay can detect is 250  copies / mL. A negative result does not preclude SARS-CoV-2 infection  and should not be used as the sole basis for treatment or other  patient management decisions.  A negative result may occur with  improper specimen collection / handling, submission of specimen other  than nasopharyngeal swab, presence of viral mutation(s) within the  areas targeted by this assay, and inadequate number of viral copies  (<250 copies / mL). A negative result must be combined with clinical  observations, patient history, and epidemiological information. If result is POSITIVE SARS-CoV-2 target nucleic acids are DETECTED. The SARS-CoV-2 RNA is generally detectable in upper and lower  respiratory specimens dur ing the acute phase of infection.  Positive  results are indicative of active infection with SARS-CoV-2.  Clinical  correlation with patient history and other  diagnostic information is  necessary to determine patient infection status.  Positive results do  not rule  out bacterial infection or co-infection with other viruses. If result is PRESUMPTIVE POSTIVE SARS-CoV-2 nucleic acids MAY BE PRESENT.   A presumptive positive result was obtained on the submitted specimen  and confirmed on repeat testing.  While 2019 novel coronavirus  (SARS-CoV-2) nucleic acids may be present in the submitted sample  additional confirmatory testing may be necessary for epidemiological  and / or clinical management purposes  to differentiate between  SARS-CoV-2 and other Sarbecovirus currently known to infect humans.  If clinically indicated additional testing with an alternate test  methodology [phone]) is advised. The SARS-CoV-2 RNA is generally  detectable in upper and lower respiratory sp ecimens during the acute  phase of infection. The expected result is Negative. Fact Sheet for Patients:  StrictlyIdeas.no Fact Sheet for Healthcare Providers: BankingDealers.co.za This test is not yet approved or cleared by the [place] FDA and has been authorized for detection and/or diagnosis of SARS-CoV-2 by FDA under an Emergency Use Authorization (EUA).  This EUA will remain in effect (meaning this test can be used) for the duration of the COVID-19 declaration under Section 564(b)(1) of the Act, 21 U.S.C. section 360bbb-3(b)(1), unless the authorization is terminated or revoked sooner. Performed at [facility], [place] [address] [place], [place] [address]   CBC     Status: Abnormal   Collection Time: 01/20/19  2:46 AM  Result Value Ref Range   WBC 4.1 4.0 - 10.5 K/uL   RBC 3.94 3.87 - 5.11 MIL/uL   Hemoglobin 11.2 (L) 12.0 - 15.0 g/dL   HCT 32.2 (L) 36.0 - 46.0 %   MCV 81.7 80.0 - 100.0 fL   MCH 28.4 26.0 - 34.0 pg   MCHC 34.8 30.0 - 36.0 g/dL   RDW 21.6 (H) 11.5 - 15.5 %   Platelets 68 (L) 150 - 400 K/uL     Comment: REPEATED TO VERIFY Immature Platelet Fraction may be clinically indicated, consider ordering this additional test [id number] CONSISTENT WITH PREVIOUS RESULT    nRBC 0.0 0.0 - 0.2 %    Comment: Performed at [facility], [place] [address] [place], [place] [address]  Comprehensive metabolic panel     Status: Abnormal   Collection Time: 01/20/19  2:46 AM  Result Value Ref Range   Sodium 137 135 - 145 mmol/L   Potassium 3.2 (L) 3.5 - 5.1 mmol/L   Chloride 106 98 - 111 mmol/L   CO2 23 22 - 32 mmol/L   Glucose, Bld 68 (L) 70 - 99 mg/dL   BUN 7 6 - 20 mg/dL   Creatinine, Ser 0.66 0.44 - 1.00 mg/dL   Calcium 8.5 (L) 8.9 - 10.3 mg/dL   Total Protein 6.2 (L) 6.5 - 8.1 g/dL   Albumin 2.3 (L) 3.5 - 5.0 g/dL   AST 59 (H) 15 - 41 U/L   ALT 22 0 - 44 U/L   Alkaline Phosphatase 180 (H) 38 - 126 U/L   Total Bilirubin 4.3 (H) 0.3 - 1.2 mg/dL   GFR calc non Af Amer >60 >60 mL/min   GFR calc Af Amer >60 >60 mL/min   Anion gap 8 5 - 15    Comment: Performed at [facility], [place] [address] [place], [place] [address]  CBG monitoring, ED     Status: Abnormal   Collection Time: 01/20/19 10:49 AM  Result Value Ref Range   Glucose-Capillary 53 (L) 70 - 99 mg/dL   Dg Chest Portable 1 View  Result Date:  01/20/2019 CLINICAL DATA:  Cirrhosis, abdominal swelling/pain, shortness of breath EXAM: PORTABLE CHEST 1 VIEW COMPARISON:  12/10/2018 and 06/04/2018 FINDINGS: Moderate right pleural effusion, new. Left lung is clear. No pneumothorax. The heart is normal in size. Sclerotic lesion along the right scapula, unchanged from 2019. IMPRESSION: Moderate right pleural effusion, new. Electronically Signed   By: Julian Hy M.D.   On: 01/20/2019 00:20    Pending Labs Unresulted Labs (From admission, onward)    Start     Ordered   01/20/19 0856  Body fluid culture (includes gram stain)  Once,   STAT    Question:  Are there also cytology or pathology orders on this specimen?  Answer:   Yes   01/20/19 0856   01/20/19 0856  Body fluid cell count with differential  Once,   STAT    Question:  Are there also cytology or pathology orders on this specimen?  Answer:  Yes   01/20/19 0856   01/20/19 0648  Protime-INR  Once,   R     01/20/19 0648   01/19/19 2348  Urine culture  Add-on,   AD     01/19/19 2347          Vitals/Pain Today's Vitals   01/20/19 1015 01/20/19 1030 01/20/19 1100 01/20/19 1110  BP:  122/81 117/69   Pulse: (!) 111 (!) 113 (!) 118   Resp:      Temp:      TempSrc:      SpO2: 91% 90% 93%   PainSc:    2     Isolation Precautions No active isolations  Medications Medications  sodium chloride flush (NS) 0.9 % injection 3 mL (has no administration in time range)  acetaminophen (TYLENOL) tablet 650 mg (has no administration in time range)    Or  acetaminophen (TYLENOL) suppository 650 mg (has no administration in time range)  nicotine (NICODERM CQ - dosed in mg/24 hours) patch 14 mg (14 mg Transdermal Patch Applied 01/20/19 0215)  cefTRIAXone (ROCEPHIN) 1 g in sodium chloride 0.9 % 100 mL IVPB (has no administration in time range)  furosemide (LASIX) tablet 40 mg (0 mg Oral Hold 01/20/19 0955)  rifaximin (XIFAXAN) tablet 550 mg (0 mg Oral Hold 01/20/19 0956)  spironolactone (ALDACTONE) tablet 200 mg (0 mg Oral Hold 01/20/19 0956)  lactulose (CHRONULAC) 10 GM/15ML solution 30 g (0 g Oral Hold 01/20/19 0956)  dextrose 50 % solution (has no administration in time range)  ondansetron (ZOFRAN-ODT) disintegrating tablet 4 mg (4 mg Oral Given 01/20/19 0041)  cefTRIAXone (ROCEPHIN) 1 g in sodium chloride 0.9 % 100 mL IVPB (0 g Intravenous Stopped 01/20/19 0130)  acetaminophen (TYLENOL) tablet 650 mg (650 mg Oral Given 01/20/19 0117)  potassium chloride SA (K-DUR) CR tablet 20 mEq (20 mEq Oral Given 01/20/19 0144)  dextrose 50 % solution 25 g (25 g Intravenous Given 01/20/19 1126)    Mobility walks Low fall risk   Focused Assessments enlarged abdo  garth   R Recommendations: See Admitting Provider Note  Report given to:   Additional Notes: .

## 2019-01-20 NOTE — H&P (Signed)
History and Physical    Katie Holland JJH:[id number] DOB: [birth year] DOA: 01/19/2019  PCP: Billie Ruddy, MD Patient coming from: Home  Chief Complaint: Abdominal swelling, shortness of breath  HPI: Katie Holland is a 53 y.o. female with medical history significant of alcoholic liver cirrhosis with ascites and esophageal varices, hepatic encephalopathy, GERD presenting to the hospital with chief complaint of abdominal swelling and shortness of breath.  Patient states she has had fluid in her abdomen in the past which had to be removed on several occasions.  In addition, she is also had fluid in her right lung in the past which had to be removed.  She ran out of her home medications including Lasix, spironolactone, and rifaximin 3 weeks ago.  She continues to take lactulose.  States for the past 1 week she has noted that her abdomen is getting more swollen and she is having shortness of breath.  Denies any fevers or abdominal pain.  Denies any chest pain.  She is coughing a little.  Denies any dysuria, urinary frequency, or urgency.  States she has not consumed any alcohol since the beginning of this year.  ED Course: Oral temperature 100.2 F on arrival, rectal temperature was checked and normal without patient receiving any antipyretic.  Tachycardic, remainder of vitals stable.  White count 3.9, hemoglobin 12.0, platelet count 82,000 (chronically low and consistent with labs done in the past 3 months).  Potassium 3.4.  BUN 5, creatinine 0.6.  AST 72, alk phos 187, T bili 4.8.  LFTs are chronically elevated, T bili now worse.  Lipase normal.  UA with positive nitrite, trace leukocytes, 0-5 WBCs, and many bacteria.  Urine culture pending.  PT/INR pending.  COVID-19 rapid test pending.  Chest x-ray showing moderate right pleural effusion, new.  Patient received ceftriaxone in the ED.  Review of Systems:  All systems reviewed and apart from history of presenting illness, are negative.  Past Medical  History:  Diagnosis Date  . Alcoholic liver disease ([place])   . Chronic back pain    on disability  . Cirrhosis of liver ([place])   . Esophageal varices ([place])   . GERD (gastroesophageal reflux disease)   . Jaundice     Past Surgical History:  Procedure Laterality Date  . BACK SURGERY    . ESOPHAGEAL BANDING  04/25/2018   Procedure: ESOPHAGEAL BANDING;  Surgeon: Yetta Flock, MD;  Location: [facility] ENDOSCOPY;  Service: Gastroenterology;;  . ESOPHAGEAL BANDING  04/27/2018   Procedure: ESOPHAGEAL BANDING;  Surgeon: Milus Banister, MD;  Location: [place];  Service: Endoscopy;;  . ESOPHAGEAL BANDING  06/02/2018   Procedure: ESOPHAGEAL BANDING;  Surgeon: Doran Stabler, MD;  Location: [place];  Service: Gastroenterology;;  . ESOPHAGEAL BANDING N/A 07/07/2018   Procedure: ESOPHAGEAL BANDING;  Surgeon: Yetta Flock, MD;  Location: [facility] ENDOSCOPY;  Service: Gastroenterology;  Laterality: N/A;  . ESOPHAGEAL BANDING  07/27/2018   Procedure: ESOPHAGEAL BANDING;  Surgeon: Yetta Flock, MD;  Location: [facility] ENDOSCOPY;  Service: Gastroenterology;;  . ESOPHAGEAL BANDING  08/30/2018   Procedure: ESOPHAGEAL BANDING;  Surgeon: Yetta Flock, MD;  Location: [facility] ENDOSCOPY;  Service: Gastroenterology;;  . ESOPHAGOGASTRODUODENOSCOPY N/A 04/27/2018   Procedure: ESOPHAGOGASTRODUODENOSCOPY (EGD);  Surgeon: Milus Banister, MD;  Location: [facility] ENDOSCOPY;  Service: Endoscopy;  Laterality: N/A;  . ESOPHAGOGASTRODUODENOSCOPY (EGD) WITH PROPOFOL N/A 04/25/2018   Procedure: ESOPHAGOGASTRODUODENOSCOPY (EGD) WITH PROPOFOL;  Surgeon: Yetta Flock, MD;  Location: [place];  Service: Gastroenterology;  Laterality: N/A;  .  ESOPHAGOGASTRODUODENOSCOPY (EGD) WITH PROPOFOL N/A 06/02/2018   Procedure: ESOPHAGOGASTRODUODENOSCOPY (EGD) WITH PROPOFOL;  Surgeon: Doran Stabler, MD;  Location: [place];  Service: Gastroenterology;  Laterality: N/A;  . ESOPHAGOGASTRODUODENOSCOPY (EGD) WITH  PROPOFOL N/A 07/07/2018   Procedure: ESOPHAGOGASTRODUODENOSCOPY (EGD) WITH PROPOFOL;  Surgeon: Yetta Flock, MD;  Location: [facility] ENDOSCOPY;  Service: Gastroenterology;  Laterality: N/A;  . ESOPHAGOGASTRODUODENOSCOPY (EGD) WITH PROPOFOL N/A 07/27/2018   Procedure: ESOPHAGOGASTRODUODENOSCOPY (EGD) WITH PROPOFOL;  Surgeon: Yetta Flock, MD;  Location: [facility] ENDOSCOPY;  Service: Gastroenterology;  Laterality: N/A;  . ESOPHAGOGASTRODUODENOSCOPY (EGD) WITH PROPOFOL N/A 08/30/2018   Procedure: ESOPHAGOGASTRODUODENOSCOPY (EGD) WITH PROPOFOL;  Surgeon: Yetta Flock, MD;  Location: [facility] ENDOSCOPY;  Service: Gastroenterology;  Laterality: N/A;  . ESOPHAGOGASTRODUODENOSCOPY (EGD) WITH PROPOFOL N/A 11/08/2018   Procedure: ESOPHAGOGASTRODUODENOSCOPY (EGD) WITH PROPOFOL;  Surgeon: Yetta Flock, MD;  Location: [facility] ENDOSCOPY;  Service: Gastroenterology;  Laterality: N/A;  . IR PARACENTESIS  04/26/2018  . IR PARACENTESIS  05/18/2018  . IR PARACENTESIS  05/31/2018  . IR PARACENTESIS  06/30/2018  . IR PARACENTESIS  08/05/2018  . IR PARACENTESIS  08/31/2018  . IR PARACENTESIS  10/08/2018  . IR PARACENTESIS  10/28/2018  . IR RADIOLOGIST EVAL & MGMT  12/23/2018  . IR THORACENTESIS ASP PLEURAL SPACE W/IMG GUIDE  12/10/2018     reports that she has been smoking cigarettes. She has a 15.00 pack-year smoking history. She has never used smokeless tobacco. She reports previous alcohol use. She reports previous drug use.  Allergies  Allergen Reactions  . Contrast Media [Iodinated Diagnostic Agents] Itching and Swelling    Needs 13-hr prep  . Latex Itching, Swelling and Other (See Comments)    No breathing impairment, however  . Sulfa Antibiotics Itching, Swelling and Other (See Comments)    No breathing impairment, however    Family History  Problem Relation Age of Onset  . Colon cancer Neg Hx   . Esophageal cancer Neg Hx     Prior to Admission medications   Medication Sig Start Date End Date Taking?  Authorizing Provider  acetaminophen (TYLENOL) 500 MG tablet Take 1,000 mg by mouth every 6 (six) hours as needed for moderate pain or headache.    [provider]  furosemide (LASIX) 20 MG tablet Take 2 tablets (40 mg total) by mouth 2 (two) times daily. Patient not taking: Reported on 11/01/2018 10/11/18   Yetta Flock, MD  Lactulose 20 GM/30ML SOLN Take 45 mLs (30 g total) by mouth 3 (three) times daily. Take 30 mls every eight hours. Titrate for 3 bm per 24 hrs Patient taking differently: Take 30 g by mouth 3 (three) times daily.  05/27/18   Esterwood, Amy S, PA-C  Multiple Vitamin (MULTIVITAMIN WITH MINERALS) TABS tablet Take 1 tablet by mouth daily.    [provider]  potassium chloride (K-DUR) 10 MEQ tablet Take 1 tablet (10 mEq total) by mouth daily. Patient not taking: Reported on 11/01/2018 10/11/18   Yetta Flock, MD  rifaximin (XIFAXAN) 550 MG TABS tablet Take 1 tablet (550 mg total) by mouth 2 (two) times daily. 05/27/18   Esterwood, Amy S, PA-C  spironolactone (ALDACTONE) 100 MG tablet Take 2 tablets (200 mg total) by mouth daily. 10/11/18   Yetta Flock, MD    Physical Exam: Vitals:   01/20/19 0000 01/20/19 0030 01/20/19 0045 01/20/19 0049  BP: 136/86 (!) 141/97 (!) 136/94   Pulse:    (!) 106  Resp:      Temp:  TempSrc:      SpO2:    96%    Physical Exam  Constitutional: She is oriented to person, place, and time. She appears well-developed and well-nourished. No distress.  HENT:  Head: Normocephalic.  Mouth/Throat: Oropharynx is clear and moist.  Eyes: Right eye exhibits no discharge. Left eye exhibits no discharge.  Neck: Neck supple.  Cardiovascular: Normal rate, regular rhythm and intact distal pulses.  Pulmonary/Chest: Effort normal and breath sounds normal. No respiratory distress. She has no wheezes. She has no rales.  Abdominal: Soft. Bowel sounds are normal. She exhibits distension. There is no abdominal tenderness. There  is no rebound and no guarding.  Musculoskeletal:        General: No edema.  Neurological: She is alert and oriented to person, place, and time.  Skin: Skin is warm and dry. She is not diaphoretic.  Psychiatric: She has a normal mood and affect. Her behavior is normal.     Labs on Admission: I have personally reviewed following labs and imaging studies  CBC: Recent Labs  Lab 01/19/19 1702  WBC 3.9*  HGB 12.0  HCT 35.3*  MCV 82.7  PLT 82*   Basic Metabolic Panel: Recent Labs  Lab 01/19/19 1702  NA 137  K 3.4*  CL 104  CO2 22  GLUCOSE 81  BUN 5*  CREATININE 0.68  CALCIUM 8.9   GFR: CrCl cannot be calculated (Unknown ideal weight.). Liver Function Tests: Recent Labs  Lab 01/19/19 1702  AST 72*  ALT 25  ALKPHOS 187*  BILITOT 4.8*  PROT 7.1  ALBUMIN 2.6*   Recent Labs  Lab 01/19/19 1702  LIPASE 21   No results for input(s): AMMONIA in the last 168 hours. Coagulation Profile: No results for input(s): INR, PROTIME in the last 168 hours. Cardiac Enzymes: No results for input(s): CKTOTAL, CKMB, CKMBINDEX, TROPONINI in the last 168 hours. BNP (last 3 results) No results for input(s): PROBNP in the last 8760 hours. HbA1C: No results for input(s): HGBA1C in the last 72 hours. CBG: No results for input(s): GLUCAP in the last 168 hours. Lipid Profile: No results for input(s): CHOL, HDL, LDLCALC, TRIG, CHOLHDL, LDLDIRECT in the last 72 hours. Thyroid Function Tests: No results for input(s): TSH, T4TOTAL, FREET4, T3FREE, THYROIDAB in the last 72 hours. Anemia Panel: No results for input(s): VITAMINB12, FOLATE, FERRITIN, TIBC, IRON, RETICCTPCT in the last 72 hours. Urine analysis:    Component Value Date/Time   COLORURINE ORANGE (A) 01/19/2019 2030   APPEARANCEUR TURBID (A) 01/19/2019 2030   LABSPEC 1.025 01/19/2019 2030   PHURINE 6.5 01/19/2019 2030   GLUCOSEU 100 (A) 01/19/2019 2030   HGBUR NEGATIVE 01/19/2019 2030   [place] (A) 01/19/2019 2030    KETONESUR 15 (A) 01/19/2019 2030   PROTEINUR 100 (A) 01/19/2019 2030   NITRITE POSITIVE (A) 01/19/2019 2030   LEUKOCYTESUR TRACE (A) 01/19/2019 2030    Radiological Exams on Admission: Dg Chest Portable 1 View  Result Date: 01/20/2019 CLINICAL DATA:  Cirrhosis, abdominal swelling/pain, shortness of breath EXAM: PORTABLE CHEST 1 VIEW COMPARISON:  12/10/2018 and 06/04/2018 FINDINGS: Moderate right pleural effusion, new. Left lung is clear. No pneumothorax. The heart is normal in size. Sclerotic lesion along the right scapula, unchanged from 2019. IMPRESSION: Moderate right pleural effusion, new. Electronically Signed   By: Julian Hy M.D.   On: 01/20/2019 00:20    Assessment/Plan Principal Problem:   Decompensated hepatic cirrhosis (HCC) Active Problems:   Tobacco dependence   Ascites   Pleural effusion  UTI (urinary tract infection)   Acutely decompensated alcoholic liver cirrhosis with recurrent ascites and moderate right pleural effusion Likely secondary to medication nonadherence.  She ran out of her home medications including diuretics 3 weeks ago.  Slightly tachycardic, however, remainder of vitals stable.  Not hypoxic or tachypneic.  SBP less likely given no fever, abdominal pain/tenderness, hypotension, or altered mental status.  No leukocytosis. -Consult IR in a.m. for thoracentesis and paracentesis -Resume home Lasix, spironolactone, rifaximin, lactulose -PT/INR pending -Continue to monitor liver function  UTI Afebrile and no leukocytosis.  UA with positive nitrite, trace leukocytes, 0-5 WBCs, and many bacteria. -Ceftriaxone -Urine culture pending  Hypokalemia Potassium 3.4.   -Replete potassium.  Check magnesium level.  Continue to monitor BMP.  Tobacco use -Counseled to quit -NicoDerm patch  DVT prophylaxis: SCDs Code Status: Patient wishes to be full code. Family Communication: No family available. Disposition Plan: Anticipate discharge after clinical  improvement. Consults called: None Admission status: It is my clinical opinion that referral for OBSERVATION is reasonable and necessary in this patient based on the above information provided. The aforementioned taken together are felt to place the patient at high risk for further clinical deterioration. However it is anticipated that the patient may be medically stable for discharge from the hospital within 24 to 48 hours.  The medical decision making on this patient was of high complexity and the patient is at high risk for clinical deterioration, therefore this is a level 3 visit.  Shela Leff MD [facility] Pager [phone]  If 7PM-7AM, please contact night-coverage [email or web address] Password [email or web address]  01/20/2019, 2:01 AM

## 2019-01-20 NOTE — Telephone Encounter (Signed)
Called daughter [name]) and left message to please call me back and let us know how her Mom is doing

## 2019-01-20 NOTE — ED Notes (Addendum)
Patient's CBG is 53, patient is A&O X 4, non-symptomatic.  Patient is NPO at this time. Message sent to Attending MD

## 2019-01-21 ENCOUNTER — Encounter (HOSPITAL_COMMUNITY): Payer: Self-pay | Admitting: Student

## 2019-01-21 ENCOUNTER — Inpatient Hospital Stay (HOSPITAL_COMMUNITY): Payer: Medicare Other

## 2019-01-21 ENCOUNTER — Other Ambulatory Visit: Payer: Self-pay

## 2019-01-21 DIAGNOSIS — I8501 Esophageal varices with bleeding: Secondary | ICD-10-CM

## 2019-01-21 HISTORY — PX: IR THORACENTESIS ASP PLEURAL SPACE W/IMG GUIDE: IMG5380

## 2019-01-21 LAB — COMPREHENSIVE METABOLIC PANEL
ALT: 19 U/L (ref 0–44)
AST: 50 U/L — ABNORMAL HIGH (ref 15–41)
Albumin: 2.1 g/dL — ABNORMAL LOW (ref 3.5–5.0)
Alkaline Phosphatase: 171 U/L — ABNORMAL HIGH (ref 38–126)
Anion gap: 8 (ref 5–15)
BUN: 7 mg/dL (ref 6–20)
CO2: 25 mmol/L (ref 22–32)
Calcium: 8.4 mg/dL — ABNORMAL LOW (ref 8.9–10.3)
Chloride: 105 mmol/L (ref 98–111)
Creatinine, Ser: 0.79 mg/dL (ref 0.44–1.00)
GFR calc Af Amer: >60 mL/min (ref >60)
GFR calc non Af Amer: >60 mL/min (ref >60)
Glucose, Bld: 110 mg/dL — ABNORMAL HIGH (ref 70–99)
Potassium: 3.4 mmol/L — ABNORMAL LOW (ref 3.5–5.1)
Sodium: 138 mmol/L (ref 135–145)
Total Bilirubin: 3.1 mg/dL — ABNORMAL HIGH (ref 0.3–1.2)
Total Protein: 5.9 g/dL — ABNORMAL LOW (ref 6.5–8.1)

## 2019-01-21 LAB — CBC
HCT: 30.4 % — ABNORMAL LOW (ref 36.0–46.0)
Hemoglobin: 10.4 g/dL — ABNORMAL LOW (ref 12.0–15.0)
MCH: 28 pg (ref 26.0–34.0)
MCHC: 34.2 g/dL (ref 30.0–36.0)
MCV: 81.9 fL (ref 80.0–100.0)
Platelets: 68 10*3/uL — ABNORMAL LOW (ref 150–400)
RBC: 3.71 MIL/uL — ABNORMAL LOW (ref 3.87–5.11)
RDW: 21.6 % — ABNORMAL HIGH (ref 11.5–15.5)
WBC: 4.2 10*3/uL (ref 4.0–10.5)
nRBC: 0 % (ref 0.0–0.2)

## 2019-01-21 LAB — GLUCOSE, CAPILLARY
Glucose-Capillary: 86 mg/dL (ref 70–99)
Glucose-Capillary: 104 mg/dL — ABNORMAL HIGH (ref 70–99)
Glucose-Capillary: 103 mg/dL — ABNORMAL HIGH (ref 70–99)
Glucose-Capillary: 166 mg/dL — ABNORMAL HIGH (ref 70–99)

## 2019-01-21 MED ORDER — LIDOCAINE HCL 1 % IJ SOLN
INTRAMUSCULAR | Status: AC
  Filled 2019-01-21: qty 20

## 2019-01-21 MED ORDER — LIDOCAINE HCL (PF) 1 % IJ SOLN
INTRAMUSCULAR | Status: DC | PRN
  Administered 2019-01-21: 10 mL

## 2019-01-21 MED ORDER — SODIUM CHLORIDE 0.9 % IV SOLN
INTRAVENOUS | Status: DC
  Administered 2019-01-21: 22:00:00 via INTRAVENOUS

## 2019-01-21 NOTE — Consult Note (Signed)
Chief Complaint: Patient was seen in consultation today for recurrent ascites  Referring Physician(s): Little Ishikawa  Supervising Physician: Arne Cleveland  Patient Status: [facility] - Out-pt  History of Present Illness: [facility] is a 53 y.o. female with past medical history of alcoholic liver disease, recurrent ascites admitted with decompensated liver disease.  Patient reportedly ran out of her medications at home.  Patient known to IR from frequent paracentesis.  Also recently met with Dr. Arne Cleveland in consultation 12/23/2018 for possible TIPS procedure.  This was planned as an outpatient.  At that time patient had shown improvement in her symptoms.  However continued with recurrent ascites.  Her MELD at that time was 11.   Review of her clinical status today shows worsening INR now 1.6, and T bili now 3.1.  Her MELDNa today is 17 lending toward increased risk with TIPS procedure.  CXR shows right pleural effusion.  IR consulted for thoracentesis, paracentesis.  Patient assessed during thoracentesis today.  She is mentating well.  Denies recent falls at home.  States she would like to feel better.  Past Medical History:  Diagnosis Date   Alcoholic liver disease ([place])    Chronic back pain    on disability   Cirrhosis of liver ([place])    Esophageal varices (HCC)    GERD (gastroesophageal reflux disease)    Jaundice     Past Surgical History:  Procedure Laterality Date   BACK SURGERY     ESOPHAGEAL BANDING  04/25/2018   Procedure: ESOPHAGEAL BANDING;  Surgeon: Yetta Flock, MD;  Location: [facility];  Service: Gastroenterology;;   ESOPHAGEAL BANDING  04/27/2018   Procedure: ESOPHAGEAL BANDING;  Surgeon: Milus Banister, MD;  Location: [place];  Service: Endoscopy;;   ESOPHAGEAL BANDING  06/02/2018   Procedure: ESOPHAGEAL BANDING;  Surgeon: Doran Stabler, MD;  Location: [place];  Service: Gastroenterology;;   ESOPHAGEAL BANDING N/A  07/07/2018   Procedure: ESOPHAGEAL BANDING;  Surgeon: Yetta Flock, MD;  Location: [facility] ENDOSCOPY;  Service: Gastroenterology;  Laterality: N/A;   ESOPHAGEAL BANDING  07/27/2018   Procedure: ESOPHAGEAL BANDING;  Surgeon: Yetta Flock, MD;  Location: [facility] ENDOSCOPY;  Service: Gastroenterology;;   ESOPHAGEAL BANDING  08/30/2018   Procedure: ESOPHAGEAL BANDING;  Surgeon: Yetta Flock, MD;  Location: [facility] ENDOSCOPY;  Service: Gastroenterology;;   ESOPHAGOGASTRODUODENOSCOPY N/A 04/27/2018   Procedure: ESOPHAGOGASTRODUODENOSCOPY (EGD);  Surgeon: Milus Banister, MD;  Location[facility] ENDOSCOPY;  Service: Endoscopy;  Laterality: N/A;   ESOPHAGOGASTRODUODENOSCOPY (EGD) WITH PROPOFOL N/A 04/25/2018   Procedure: ESOPHAGOGASTRODUODENOSCOPY (EGD) WITH PROPOFOL;  Surgeon: Yetta Flock, MD;  Location: [place];  Service: Gastroenterology;  Laterality: N/A;   ESOPHAGOGASTRODUODENOSCOPY (EGD) WITH PROPOFOL N/A 06/02/2018   Procedure: ESOPHAGOGASTRODUODENOSCOPY (EGD) WITH PROPOFOL;  Surgeon: Doran Stabler, MD;  Location: [place];  Service: Gastroenterology;  Laterality: N/A;   ESOPHAGOGASTRODUODENOSCOPY (EGD) WITH PROPOFOL N/A 07/07/2018   Procedure: ESOPHAGOGASTRODUODENOSCOPY (EGD) WITH PROPOFOL;  Surgeon: Yetta Flock, MD;  Location: [facility] ENDOS[facility];  Service: Gastroenterology;  Laterality: N/A;   ESOPHAGOGASTRODUODENOSCOPY (EGD) WITH PROPOFOL N/A 07/27/2018   Procedure: ESOPHAGOGASTRODUODENOSCOPY (EGD) WITH PROPOFOL;  Surgeon: Yetta Flock, MD;  Location: [facility] ENDOS[facility];  Service: Gastroenterology;  Laterality: N/A;   ESOPHAGOGASTRODUODENOSCOPY (EGD) WITH PROPOFOL N/A 08/30/2018   Procedure: ESOPHAGOGASTRODUODENOSCOPY (EGD) WITH PROPOFOL;  Surgeon: Yetta Flock, MD;  Location: [facility] ENDOSCOPY;  Service: Gastroenterology;  Laterality: N/A;   ESOPHAGOGASTRODUODENOSCOPY (EGD) WITH PROPOFOL N/A 11/08/2018   Procedure: ESOPHAGOGASTRODUODENOSCOPY (EGD)  WITH PROPOFOL;   Surgeon: Yetta Flock, MD;  Location: [facility];  Service: Gastroenterology;  Laterality: N/A;   IR PARACENTESIS  04/26/2018   IR PARACENTESIS  05/18/2018   IR PARACENTESIS  05/31/2018   IR PARACENTESIS  06/30/2018   IR PARACENTESIS  08/05/2018   IR PARACENTESIS  08/31/2018   IR PARACENTESIS  10/08/2018   IR PARACENTESIS  10/28/2018   IR RADIOLOGIST EVAL & MGMT  12/23/2018   IR THORACENTESIS ASP PLEURAL SPACE W/IMG GUIDE  12/10/2018    Allergies: Contrast media [iodinated diagnostic agents], Latex, and Sulfa antibiotics  Medications: Prior to Admission medications   Medication Sig Start Date End Date Taking? Authorizing Provider  acetaminophen (TYLENOL) 500 MG tablet Take 1,000 mg by mouth every 6 (six) hours as needed for moderate pain or headache.   Yes [provider]  Lactulose 20 GM/30ML SOLN Take 45 mLs (30 g total) by mouth 3 (three) times daily. Take 30 mls every eight hours. Titrate for 3 bm per 24 hrs Patient taking differently: Take 30 g by mouth every other day.  05/27/18  Yes Esterwood, Amy S, PA-C  Multiple Vitamin (MULTIVITAMIN WITH MINERALS) TABS tablet Take 1 tablet by mouth daily.   Yes [provider]  potassium chloride (K-DUR) 10 MEQ tablet Take 1 tablet (10 mEq total) by mouth daily. Patient not taking: Reported on 11/01/2018 10/11/18   Arm[facility], Carlota Raspberry, MD     Family History  Problem Relation Age of Onset   Colon cancer Neg Hx    Esophageal cancer Neg Hx     Social History   Socioeconomic History   Marital status: Single    Spouse name: Not on file   Number of children: 8   Years of education: Not on file   Highest education level: Not on file  Occupational History   Occupation: disability  Social Needs   Financial resource strain: Not on file   Food insecurity    Worry: Not on file    Inability: Not on file   Transportation needs    Medical: Not on file    Non-medical: Not on file  Tobacco Use   Smoking  status: Current Every Day Smoker    Packs/day: 0.75    Years: 20.00    Pack years: 15.00    Types: Cigarettes   Smokeless tobacco: Never Used  Substance and Sexual Activity   Alcohol use: Not Currently    Comment: denies h/o withdrawal, quit 05/23/2018   Drug use: Not Currently   Sexual activity: Not on file  Lifestyle   Physical activity    Days per week: Not on file    Minutes per session: Not on file   Stress: Not on file  Relationships   Social connections    Talks on phone: Not on file    Gets together: Not on file    Attends religious service: Not on file    Active member of club or organization: Not on file    Attends meetings of clubs or organizations: Not on file    Relationship status: Not on file  Other Topics Concern   Not on file  Social History Narrative   Not on file     Review of Systems: A 12 point ROS discussed and pertinent positives are indicated in the HPI above.  All other systems are negative.  Review of Systems  Constitutional: Negative for fatigue and fever.  Respiratory: Negative for cough and shortness of breath.  Cardiovascular: Negative for chest pain.  Gastrointestinal: Negative for abdominal pain, nausea and vomiting.  Genitourinary: Negative for dysuria.  Musculoskeletal: Negative for back pain.  Psychiatric/Behavioral: Negative for behavioral problems and confusion.    Vital Signs: BP 111/83    Pulse (!) 102    Temp 98.7 F (37.1 C) (Oral)    Resp 18    Ht 5' 4" (1.626 m)    Wt 153 lb 7 oz (69.6 kg)    SpO2 95%    BMI 26.34 kg/m   Physical Exam Vitals signs and nursing note reviewed.  Constitutional:      Appearance: She is well-developed.  Cardiovascular:     Rate and Rhythm: Normal rate and regular rhythm.  Pulmonary:     Effort: Pulmonary effort is normal. No respiratory distress.     Breath sounds: Normal breath sounds.  Abdominal:     General: Abdomen is protuberant. There is distension.     Tenderness: There is  no abdominal tenderness.  Skin:    General: Skin is warm and dry.  Neurological:     General: No focal deficit present.     Mental Status: She is alert.  Psychiatric:        Mood and Affect: Mood normal.        Behavior: Behavior normal.          Imaging: Ct Angio Abdomen W &/or Wo Contrast  Result Date: 01/01/2019 CLINICAL DATA:  53 year old with portal hypertension. Pre TIPS evaluation. EXAM: CT ANGIOGRAPHY ABDOMEN TECHNIQUE: Multidetector CT imaging of the abdomen was performed using the standard protocol during bolus administration of intravenous contrast. Multiplanar reconstructed images and MIPs were obtained and reviewed to evaluate the vascular anatomy. CONTRAST:  182m OMNIPAQUE IOHEXOL 350 MG/ML SOLN COMPARISON:  CT 04/25/2018 FINDINGS: VASCULAR Aorta: Normal caliber of the abdominal aorta and distal descending thoracic aorta. No significant atherosclerotic disease. Negative for dissection. Celiac: Celiac trunk is patent. There is a replaced left hepatic artery coming off the left gastric artery. Origin of the celiac trunk is widely patent. SMA: Limited evaluation of the origin due to motion artifact. SMA is patent. Renals: Bilateral renal arteries are patent without evidence of stenosis. IMA: IMA is patent. Inflow: Common iliac arteries are patent. The proximal internal and external iliac arteries are patent bilaterally. Veins: Main portal vein is patent but small. Portal vein measures 7 mm in diameter. Left and right portal veins are patent. SMV is patent. Splenic vein is patent. There are large esophageal varices which appear to be associated with a large left gastric vein. No significant gastro renal shunt. No significant gastric varices. Prominent venous structures along the anterior left upper abdomen. The left and middle hepatic veins may have a shared origin. Separate origin for the right hepatic vein. IVC is patent. Proximal iliac veins are patent. There is a prominent right  gonadal vein. Review of the MIP images confirms the above findings. NON-VASCULAR Lower chest: Large right pleural effusion with extensive compressive atelectasis in the right lung base. Hepatobiliary: Liver is small and nodular. Findings compatible with cirrhosis. No suspicious liver lesion. Fluid around the gallbladder. Difficult to exclude gallbladder wall thickening. Pancreas: Unremarkable. No pancreatic ductal dilatation or surrounding inflammatory changes. Spleen: No significant splenic enlargement. No focal splenic abnormality. Adrenals/Urinary Tract: Adrenal tissue is unremarkable. Right adrenal gland is not well visualized. Normal appearance of the kidneys. Punctate low-density structure in the mid right kidney could represent a cyst but too small to definitively characterize. Stomach/Bowel: Varices around  the distal esophagus. Mild wall thickening in the distal stomach. No evidence for bowel obstruction. Limited evaluation of the right colon due to surrounding fluid and under distension. Lymphatic: No significant lymph node enlargement in the abdomen. Other: Large amount of abdominal ascites. Musculoskeletal: Pedicle screw and rod fixation at L4-L5. No acute bone abnormality. IMPRESSION: VASCULAR 1. Large esophageal varices associated the left gastric vein. 2. Portal venous system is patent. Main portal vein slightly small measuring 7 mm in diameter. Hepatic veins are patent. 3. No significant gastro renal shunt. 4. Arterial structures are patent without any significant atherosclerotic disease. NON-VASCULAR 1. Cirrhosis with large amount of ascites. Large right pleural effusion. 2. Compressive atelectasis in the right lung secondary to the large right pleural effusion. Electronically Signed   By: Markus Daft M.D.   On: 01/01/2019 15:06   Dg Chest Portable 1 View  Result Date: 01/20/2019 CLINICAL DATA:  Cirrhosis, abdominal swelling/pain, shortness of breath EXAM: PORTABLE CHEST 1 VIEW COMPARISON:   12/10/2018 and 06/04/2018 FINDINGS: Moderate right pleural effusion, new. Left lung is clear. No pneumothorax. The heart is normal in size. Sclerotic lesion along the right scapula, unchanged from 2019. IMPRESSION: Moderate right pleural effusion, new. Electronically Signed   By: Julian Hy M.D.   On: 01/20/2019 00:20   Ir Radiologist Eval & Mgmt  Result Date: 12/23/2018 Please refer to notes tab for details about interventional procedure. (Op Note)   Labs:  CBC: Recent Labs    11/04/18 0934 01/19/19 1702 01/20/19 0246 01/21/19 0626  WBC 3.9* 3.9* 4.1 4.2  HGB 11.3* 12.0 11.2* 10.4*  HCT 34.0* 35.3* 32.2* 30.4*  PLT 77.0 Repeated and verified X2.* 82* 68* 68*    COAGS: Recent Labs    06/04/18 0319  07/02/18 1436 08/05/18 1022 11/04/18 0934 01/20/19 1407  INR 1.47   < > 1.6* 1.45 1.3* 1.6*  APTT 27  --   --   --   --   --    < > = values in this interval not displayed.    BMP: Recent Labs    08/05/18 1022  11/04/18 0934 12/30/18 0842 01/19/19 1702 01/20/19 0246 01/21/19 0626  NA 139   < > 139  --  137 137 138  K 3.4*   < > 3.4*  --  3.4* 3.2* 3.4*  CL 108   < > 104  --  104 106 105  CO2 22   < > 26  --  _0 GLUCOSE 87   < > 90  --  81 68* 110*  BUN 5*   < > 7  --  5* 7 7  CALCIUM 8.9   < > 8.5  --  8.9 8.5* 8.4*  CREATININE 0.70   < > 0.57 0.50 0.68 0.66 0.79  GFRNONAA >60  --   --   --  >60 >60 >60  GFRAA >60  --   --   --  >60 >60 >60   < > = values in this interval not displayed.    LIVER FUNCTION TESTS: Recent Labs    11/04/18 0934 01/19/19 1702 01/20/19 0246 01/21/19 0626  BILITOT 1.9* 4.8* 4.3* 3.1*  AST 48* 72* 59* 50*  ALT _1 ALKPHOS 183* 187* 180* 171*  PROT 6.6 7.1 6.2* 5.9*  ALBUMIN 3.1* 2.6* 2.3* 2.1*    TUMOR MARKERS: Recent Labs    05/27/18 1514 11/04/18 0934  AFPTM 2.7 2.1    Assessment and  Plan: Decompensated liver disease, recurrent ascites [facility] is a 53 year old female known to IR from  frequent paracentesis, and recent TIPS evaluation with Dr. Vernard Gambles.  She presents to [facility] now with right pleural effusion recurrent ascites. Consideration for inpatient TIPS discussed with Dr. Vernard Gambles.  Patient with worsening of her MELD score. No GI consultation planned as inpatient. Discussed with Dr. Avon Gully.  Plan to proceed with thoracentesis today, paracentesis tomorrow.  Patient will then discharge and continue TIPS evaluation as an outpatient. Patient aware.  Thank you for this interesting consult.  I greatly enjoyed meeting [facility] and look forward to participating in their care.  A copy of this report was sent to the requesting provider on this date.  Electronically Signed: Docia Barrier, PA 01/21/2019, 4:19 PM   I spent a total of    15 Minutes in face to face in clinical consultation, greater than 50% of which was counseling/coordinating care for decompensated liver disease.

## 2019-01-21 NOTE — Progress Notes (Signed)
PROGRESS NOTE    [facility]  CHY:[id number] DOB: Oct 26, 1965 DOA: 01/19/2019 PCP: Billie Ruddy, MD   Brief Narrative:  K[facility] is a 53 y.o. female with medical history significant of alcoholic liver cirrhosis with ascites and esophageal varices, hepatic encephalopathy, GERD presenting to the hospital with chief complaint of abdominal swelling and shortness of breath.  Patient states she has had fluid in her abdomen in the past which had to be removed on several occasions.  In addition, she is also had fluid in her right lung in the past which had to be removed.  She ran out of her home medications including Lasix, spironolactone, and rifaximin 3 weeks ago.  She continues to take lactulose.  States for the past 1 week she has noted that her abdomen is getting more swollen and she is having shortness of breath.  Denies any fevers or abdominal pain.  Denies any chest pain.  She is coughing a little.  Denies any dysuria, urinary frequency, or urgency.  States she has not consumed any alcohol since the beginning of this year. In ED: Oral temperature 100.2 F on arrival, rectal temperature was checked and normal without patient receiving any antipyretic.  Tachycardic, remainder of vitals stable.  White count 3.9, hemoglobin 12.0, platelet count 82,000 (chronically low and consistent with labs done in the past 3 months).  Potassium 3.4.  BUN 5, creatinine 0.6.  AST 72, alk phos 187, T bili 4.8.  LFTs are chronically elevated, T bili now worse.  Lipase normal.  UA with positive nitrite, trace leukocytes, 0-5 WBCs, and many bacteria.  Urine culture pending.  PT/INR pending.  Chest x-ray showing moderate right pleural effusion, new.  Patient received ceftriaxone in the ED.  IR consulted for right thoracentesis as well as paracentesis given patient's ascites and pleural effusion noted on imaging.  Questionable UTI on intake, continue ceftriaxone. Patient to be monitored overnight with likely disposition  back home in the next 24 to 48 hours pending clinical course and successful paracentesis and thoracentesis per IR schedule. Will need outpatient follow up with Armbruster ([facility]) per their schedule in the next few weeks.  Assessment & Plan:   Principal Problem:   Decompensated hepatic cirrhosis (HCC) Active Problems:   Tobacco dependence   Ascites   Pleural effusion   UTI (urinary tract infection)  Acutely decompensated alcoholic liver cirrhosis with recurrent ascites and moderate right pleural effusion Likely secondary to medication nonadherence.   She ran out of her home medications including diuretics 3 weeks ago.   MELD 16 currently - 6.0% risk of mortality over 3 months  Patient will need to follow [facility] in the outpatient setting once stable IR following - likely paracentesis today, thoracentesis tomorrow -Resume home Lasix, spironolactone, rifaximin, lactulose -PT/INR minimally elevated 19/1.6 respectively -Continue to monitor liver function with daily labs - AST minimally up today  UTI, POA, does not meet sepsis critieria Symptomatic burning/increased frequency Remains afebrile and without leukocytosis.   UA with positive nitrite, trace leukocytes, 0-5 WBCs, and many bacteria. -Ceftriaxone initiated 7/30 - should finish 8/1 -Urine culture appears mixed flora, ? Not clean catch - will treat based on symptoms per IDSA guidelines  Hypokalemia, mild, stable Potassium 3.4.   Follow am labs  Tobacco use -Counseled to quit -NicoDerm patch  DVT prophylaxis: SCDs while peri-procedural Code Status: FULL code Disposition Plan: Anticipate discharge after clinical improvement and treatment of ascites and pleural effusion Consults called: IR Admission status: Inpatient - need  for ongoing evaluation and intervention for symptomatic ascites and pleural effusion.   Scheduled Meds: . furosemide  40 mg Oral BID  . lactulose  30 g Oral TID  . nicotine  14 mg  Transdermal Daily  . rifaximin  550 mg Oral BID  . spironolactone  200 mg Oral Daily   Continuous Infusions: . cefTRIAXone (ROCEPHIN)  IV 1 g (01/21/19 0020)     LOS: 1 day   Time spent: [place], DO [facility]  If 7PM-7AM, please contact night-coverage [email or web address] Password [email or web address] 01/21/2019, 1:04 PM

## 2019-01-21 NOTE — Procedures (Signed)
PROCEDURE SUMMARY:  Successful US guided therapeutic right thoracentesis. Yielded 2.4 liters of yellow fluid. Pt tolerated procedure well. No immediate complications.  Specimen was not sent for labs. CXR ordered.  EBL < 5 mL  Docia Barrier PA-C 01/21/2019 4:15 PM

## 2019-01-22 ENCOUNTER — Inpatient Hospital Stay (HOSPITAL_COMMUNITY): Payer: Medicare Other

## 2019-01-22 LAB — COMPREHENSIVE METABOLIC PANEL
ALT: 20 U/L (ref 0–44)
AST: 50 U/L — ABNORMAL HIGH (ref 15–41)
Albumin: 2 g/dL — ABNORMAL LOW (ref 3.5–5.0)
Alkaline Phosphatase: 165 U/L — ABNORMAL HIGH (ref 38–126)
Anion gap: 10 (ref 5–15)
BUN: <5 mg/dL — ABNORMAL LOW (ref 6–20)
CO2: 23 mmol/L (ref 22–32)
Calcium: 8.1 mg/dL — ABNORMAL LOW (ref 8.9–10.3)
Chloride: 105 mmol/L (ref 98–111)
Creatinine, Ser: 0.68 mg/dL (ref 0.44–1.00)
GFR calc Af Amer: >60 mL/min (ref >60)
GFR calc non Af Amer: >60 mL/min (ref >60)
Glucose, Bld: 102 mg/dL — ABNORMAL HIGH (ref 70–99)
Potassium: 3.2 mmol/L — ABNORMAL LOW (ref 3.5–5.1)
Sodium: 138 mmol/L (ref 135–145)
Total Bilirubin: 2.7 mg/dL — ABNORMAL HIGH (ref 0.3–1.2)
Total Protein: 5.9 g/dL — ABNORMAL LOW (ref 6.5–8.1)

## 2019-01-22 LAB — GLUCOSE, CAPILLARY
Glucose-Capillary: 83 mg/dL (ref 70–99)
Glucose-Capillary: 93 mg/dL (ref 70–99)
Glucose-Capillary: 97 mg/dL (ref 70–99)
Glucose-Capillary: 105 mg/dL — ABNORMAL HIGH (ref 70–99)

## 2019-01-22 LAB — CBC
HCT: 31.8 % — ABNORMAL LOW (ref 36.0–46.0)
Hemoglobin: 10.9 g/dL — ABNORMAL LOW (ref 12.0–15.0)
MCH: 28.5 pg (ref 26.0–34.0)
MCHC: 34.3 g/dL (ref 30.0–36.0)
MCV: 83 fL (ref 80.0–100.0)
Platelets: 66 10*3/uL — ABNORMAL LOW (ref 150–400)
RBC: 3.83 MIL/uL — ABNORMAL LOW (ref 3.87–5.11)
RDW: 22.2 % — ABNORMAL HIGH (ref 11.5–15.5)
WBC: 4.1 10*3/uL (ref 4.0–10.5)
nRBC: 0 % (ref 0.0–0.2)

## 2019-01-22 MED ORDER — LIDOCAINE HCL (PF) 1 % IJ SOLN
INTRAMUSCULAR | Status: AC
  Filled 2019-01-22: qty 30

## 2019-01-22 MED ORDER — FUROSEMIDE 20 MG PO TABS
20.0000 mg | ORAL_TABLET | Freq: Four times a day (QID) | ORAL | Status: AC
  Administered 2019-01-22 – 2019-01-23 (×4): 20 mg via ORAL
  Filled 2019-01-22 (×4): qty 1

## 2019-01-22 MED ORDER — POTASSIUM CHLORIDE CRYS ER 20 MEQ PO TBCR
40.0000 meq | EXTENDED_RELEASE_TABLET | Freq: Once | ORAL | Status: AC
  Administered 2019-01-22: 40 meq via ORAL
  Filled 2019-01-22: qty 2

## 2019-01-22 MED ORDER — ALBUMIN HUMAN 5 % IV SOLN
25.0000 g | Freq: Four times a day (QID) | INTRAVENOUS | Status: AC
  Administered 2019-01-22 – 2019-01-23 (×4): 25 g via INTRAVENOUS
  Filled 2019-01-22 (×4): qty 500

## 2019-01-22 NOTE — Plan of Care (Signed)
  Problem: Education: Goal: Knowledge of General Education information will improve Description Including pain rating scale, medication(s)/side effects and non-pharmacologic comfort measures Outcome: Progressing   

## 2019-01-22 NOTE — Progress Notes (Signed)
 PROGRESS NOTE    [name]  MRN:[id number] DOB: 01/04/1966 DOA: 01/19/2019 PCP: Banks, Shannon R, MD   Brief Narrative:  Katie Holland is a 53 y.o. female with medical history significant of alcoholic liver cirrhosis with ascites and esophageal varices, hepatic encephalopathy, GERD presenting to the hospital with chief complaint of abdominal swelling and shortness of breath.  Patient states she has had fluid in her abdomen in the past which had to be removed on several occasions.  In addition, she is also had fluid in her right lung in the past which had to be removed.  She ran out of her home medications including Lasix, spironolactone, and rifaximin 3 weeks ago.  She continues to take lactulose.  States for the past 1 week she has noted that her abdomen is getting more swollen and she is having shortness of breath.  Denies any fevers or abdominal pain.  Denies any chest pain.  She is coughing a little.  Denies any dysuria, urinary frequency, or urgency.  States she has not consumed any alcohol since the beginning of this year. In ED: Oral temperature 100.2 F on arrival, rectal temperature was checked and normal without patient receiving any antipyretic.  Tachycardic, remainder of vitals stable.  White count 3.9, hemoglobin 12.0, platelet count 82,000 (chronically low and consistent with labs done in the past 3 months).  Potassium 3.4.  BUN 5, creatinine 0.6.  AST 72, alk phos 187, T bili 4.8.  LFTs are chronically elevated, T bili now worse.  Lipase normal.  UA with positive nitrite, trace leukocytes, 0-5 WBCs, and many bacteria.  Urine culture pending.  PT/INR pending.  Chest x-ray showing moderate right pleural effusion, new.  Patient received ceftriaxone in the ED.  IR consulted for right thoracentesis as well as paracentesis given patient's ascites and pleural effusion noted on imaging.  Questionable UTI on intake, continue ceftriaxone. Patient to be monitored overnight with likely disposition  back home in the next 24 to 48 hours pending clinical course and successful paracentesis and thoracentesis per IR schedule. Will need outpatient follow up with [facility]) per their schedule in the next few weeks.  Subjective:   The patient was seen and examined this morning, status post successful thoracentesis yielding 2.4 L yesterday, and no distress this morning.  Only complaint is abdominal distention, Denies having any shortness of breath chest pain.  Denies any abdominal pain at this time. No issues overnight  -Pending plan for paracentesis today, pending IR evaluation   Assessment & Plan:   Principal Problem:   Decompensated hepatic cirrhosis (HCC) Active Problems:   Tobacco dependence   Ascites   Pleural effusion   UTI (urinary tract infection)   Acutely decompensated alcoholic liver cirrhosis with recurrent ascites and moderate right pleural effusion -Stable,  status post thoracentesis yielding 2.4 L on 9/31/ 2020  Likely secondary to medication nonadherence.   She ran out of her home medications including diuretics 3 weeks ago.   MELD 16 currently - 6.0% risk of mortality over 3 months  Patient will need to follow [facility] in the outpatient setting once stable IR following - likely paracentesis today,  -Resume home Lasix, spironolactone, rifaximin, lactulose -PT/INR minimally elevated 19/1.6 respectively -Continue to monitor liver function with daily labs - AST minimally up today  UTI, POA, does not meet sepsis critieria -Much improved symptoms from dysuria burning denies any discharge improving frequency and urge Remains afebrile and without leukocytosis.   UA with positive nitrite, trace   leukocytes, 0-5 WBCs, and many bacteria. -Ceftriaxone initiated 7/30 - should finish 8/1 -Urine culture appears mixed flora, ? Not clean catch - will treat based on symptoms per IDSA guidelines  Hypokalemia, mild, stable Potassium 3.4.  >>3.4 >> -Replacing  orally  Tobacco use -Counseled to quit -NicoDerm patch  DVT prophylaxis: SCDs while peri-procedural Code Status: FULL code Disposition Plan: Anticipate discharge after clinical improvement and treatment of ascites and pleural effusion Consults called: IR Admission status: Inpatient - need for ongoing evaluation and intervention for symptomatic ascites and pleural effusion. Pending paracentesis.     Consultants: IR  Procedures:   -  status post thoracentesis yielding 2.4 L on 9/31/ 2020  - Paracentesis  =>  Antimicrobials:  Anti-infectives (From admission, onward)   Start     Dose/Rate Route Frequency Ordered Stop   01/21/19 0045  cefTRIAXone (ROCEPHIN) 1 g in sodium chloride 0.9 % 100 mL IVPB     1 g 200 mL/hr over 30 Minutes Intravenous Every 24 hours 01/20/19 0146     01/20/19 0245  rifaximin (XIFAXAN) tablet 550 mg     550 mg Oral 2 times daily 01/20/19 0154     01/20/19 0130  cefTRIAXone (ROCEPHIN) 1 g in sodium chloride 0.9 % 100 mL IVPB  Status:  Discontinued     1 g 200 mL/hr over 30 Minutes Intravenous  Once 01/20/19 0129 01/20/19 0129   01/20/19 0045  cefTRIAXone (ROCEPHIN) 1 g in sodium chloride 0.9 % 100 mL IVPB     1 g 200 mL/hr over 30 Minutes Intravenous  Once 01/20/19 0035 01/20/19 0130       Medication:  . furosemide  40 mg Oral BID  . lactulose  30 g Oral TID  . nicotine  14 mg Transdermal Daily  . rifaximin  550 mg Oral BID  . spironolactone  200 mg Oral Daily    acetaminophen **OR** acetaminophen, lidocaine (PF), traMADol   Objective:   Vitals:   01/21/19 0757 01/21/19 1651 01/21/19 2303 01/22/19 0831  BP: 111/83 104/72 115/82 113/76  Pulse: (!) 102 (!) 104 (!) 108 98  Resp: _0 Temp: 98.7 F (37.1 C) 98.4 F (36.9 C) 98.3 F (36.8 C) 98.5 F (36.9 C)  TempSrc: Oral  Oral Oral  SpO2: 95% 99% 98% 92%  Weight:      Height:        Intake/Output Summary (Last 24 hours) at 01/22/2019 1150 Last data filed at 01/22/2019 0600  Gross per 24 hour  Intake 327.23 ml  Output -  Net 327.23 ml   Filed Weights   01/21/19 4174  Weight: 69.6 kg     Examination:   Physical Exam  Constitution:  Alert, cooperative, no distress,  Appears calm and comfortable  Psychiatric: Normal and stable mood and affect, cognition intact,   HEENT: Normocephalic, PERRL, otherwise with in Normal limits  Chest:Chest symmetric Cardio vascular:  S1/S2, RRR, No murmure, No Rubs or Gallops  pulmonary: Clear to auscultation bilaterally, respirations unlabored, negative wheezes / crackles Abdomen: Soft, non-tender, positive distention with fluid shift, bowel sounds,no masses, no organomegaly Muscular skeletal: Limited exam - in bed, able to move all 4 extremities, Normal strength,    Neuro: CNII-XII intact. , normal motor and sensation, reflexes intact  Extremities: No pitting edema lower extremities, +2 pulses  Skin: Dry, warm to touch, negative for any Rashes, No open wounds Wounds: per nursing documentation  LABs:  CBC Latest Ref Rng & Units 01/22/2019 01/21/2019  01/20/2019  WBC 4.0 - 10.5 K/uL 4.1 4.2 4.1  Hemoglobin 12.0 - 15.0 g/dL 10.9(L) 10.4(L) 11.2(L)  Hematocrit 36.0 - 46.0 % 31.8(L) 30.4(L) 32.2(L)  Platelets 150 - 400 K/uL 66(L) 68(L) 68(L)   CMP Latest Ref Rng & Units 01/22/2019 01/21/2019 01/20/2019  Glucose 70 - 99 mg/dL 102(H) 110(H) 68(L)  BUN 6 - 20 mg/dL <5(L) 7 7  Creatinine 0.44 - 1.00 mg/dL 0.68 0.79 0.66  Sodium 135 - 145 mmol/L 138 138 137  Potassium 3.5 - 5.1 mmol/L 3.2(L) 3.4(L) 3.2(L)  Chloride 98 - 111 mmol/L 105 105 106  CO2 22 - 32 mmol/L _0 Calcium 8.9 - 10.3 mg/dL 8.1(L) 8.4(L) 8.5(L)  Total Protein 6.5 - 8.1 g/dL 5.9(L) 5.9(L) 6.2(L)  Total Bilirubin 0.3 - 1.2 mg/dL 2.7(H) 3.1(H) 4.3(H)  Alkaline Phos 38 - 126 U/L 165(H) 171(H) 180(H)  AST 15 - 41 U/L 50(H) 50(H) 59(H)  ALT 0 - 44 U/L _1 SIGNED: Deatra James, MD, FACP, FHM. [facility],  Pager [phone]   If 7PM-7AM, please contact night-coverage [email or web address], Password [facility] 01/22/2019, 11:50 AM      LOS: 2 days   Time spent: 35 min

## 2019-01-22 NOTE — Progress Notes (Signed)
6.1L removed during paracentesis. Per verbal orders over phone pt will get 25g Albumin Q6hrs x 4 doses and 20mg  Lasix PO Q6hrs x 4 doses. If pt tolerates well overnight with no acute events, plan to DC in am.

## 2019-01-22 NOTE — Procedures (Signed)
PROCEDURE SUMMARY:  Successful image-guided paracentesis from the left lower abdomen.  Yielded 6.1 liters of clear yellow fluid.  No immediate complications.  EBL: zero Patient tolerated well.   Specimen was not sent for labs.  Please see imaging section of Epic for full dictation.  Joaquim Nam PA-C 01/22/2019 3:02 PM

## 2019-01-23 LAB — COMPREHENSIVE METABOLIC PANEL
ALT: 12 U/L (ref 0–44)
AST: 31 U/L (ref 15–41)
Albumin: 2.7 g/dL — ABNORMAL LOW (ref 3.5–5.0)
Alkaline Phosphatase: 109 U/L (ref 38–126)
Anion gap: 7 (ref 5–15)
BUN: <5 mg/dL — ABNORMAL LOW (ref 6–20)
CO2: 25 mmol/L (ref 22–32)
Calcium: 8.3 mg/dL — ABNORMAL LOW (ref 8.9–10.3)
Chloride: 106 mmol/L (ref 98–111)
Creatinine, Ser: 0.59 mg/dL (ref 0.44–1.00)
GFR calc Af Amer: >60 mL/min (ref >60)
GFR calc non Af Amer: >60 mL/min (ref >60)
Glucose, Bld: 114 mg/dL — ABNORMAL HIGH (ref 70–99)
Potassium: 3.1 mmol/L — ABNORMAL LOW (ref 3.5–5.1)
Sodium: 138 mmol/L (ref 135–145)
Total Bilirubin: 2.5 mg/dL — ABNORMAL HIGH (ref 0.3–1.2)
Total Protein: 5.2 g/dL — ABNORMAL LOW (ref 6.5–8.1)

## 2019-01-23 LAB — CBC
HCT: 27.9 % — ABNORMAL LOW (ref 36.0–46.0)
Hemoglobin: 9.5 g/dL — ABNORMAL LOW (ref 12.0–15.0)
MCH: 28.3 pg (ref 26.0–34.0)
MCHC: 34.1 g/dL (ref 30.0–36.0)
MCV: 83 fL (ref 80.0–100.0)
Platelets: 55 10*3/uL — ABNORMAL LOW (ref 150–400)
RBC: 3.36 MIL/uL — ABNORMAL LOW (ref 3.87–5.11)
RDW: 21.8 % — ABNORMAL HIGH (ref 11.5–15.5)
WBC: 3 10*3/uL — ABNORMAL LOW (ref 4.0–10.5)
nRBC: 0 % (ref 0.0–0.2)

## 2019-01-23 LAB — GLUCOSE, CAPILLARY
Glucose-Capillary: 114 mg/dL — ABNORMAL HIGH (ref 70–99)
Glucose-Capillary: 119 mg/dL — ABNORMAL HIGH (ref 70–99)

## 2019-01-23 MED ORDER — NICOTINE 14 MG/24HR TD PT24: 14.0000 mg | MEDICATED_PATCH | Freq: Every day | TRANSDERMAL | 0 refills | Status: DC

## 2019-01-23 MED ORDER — RIFAXIMIN 550 MG PO TABS: 550.0000 mg | ORAL_TABLET | Freq: Two times a day (BID) | ORAL | 11 refills | Status: DC

## 2019-01-23 MED ORDER — LACTULOSE 10 GM/15ML PO SOLN: 30.0000 g | Freq: Three times a day (TID) | ORAL | 3 refills | Status: DC

## 2019-01-23 MED ORDER — RIFAXIMIN 550 MG PO TABS: 550.0000 mg | ORAL_TABLET | Freq: Two times a day (BID) | ORAL | 5 refills | Status: DC

## 2019-01-23 MED ORDER — FUROSEMIDE 20 MG PO TABS: 40.0000 mg | ORAL_TABLET | Freq: Two times a day (BID) | ORAL | 3 refills | Status: DC

## 2019-01-23 MED ORDER — SPIRONOLACTONE 100 MG PO TABS: 200.0000 mg | ORAL_TABLET | Freq: Every day | ORAL | 2 refills | Status: DC

## 2019-01-23 MED ORDER — POTASSIUM CHLORIDE ER 10 MEQ PO TBCR: 20.0000 meq | EXTENDED_RELEASE_TABLET | Freq: Every day | ORAL | 3 refills | Status: DC

## 2019-01-23 NOTE — Discharge Instructions (Signed)
Cirrhosis    Cirrhosis is long-term (chronic) liver injury. The liver is the body's largest internal organ, and it performs many functions. It converts food into energy, removes toxic material from the blood, makes important proteins, and absorbs necessary vitamins from food.  In cirrhosis, healthy liver cells are replaced by scar tissue. This prevents blood from flowing through the liver, making it difficult for the liver to function. Scarring of the liver cannot be reversed, but treatment can prevent it from getting worse.  What are the causes?  Common causes of this condition are hepatitis C and long-term alcohol abuse. Other causes include:  · Nonalcoholic fatty liver disease. This happens when fat is deposited in the liver by causes other than alcohol.  · Hepatitis B infection.  · Autoimmune hepatitis. In this condition, the body's defense system (immune system) mistakenly attacks the liver cells, causing irritation and swelling (inflammation).  · Diseases that cause blockage of ducts inside the liver.  · Inherited liver diseases, such as hemochromatosis. This is one of the most common inherited liver diseases. In this disease, deposits of iron collect in the liver and other organs.  · Reactions to certain long-term medicines, such as amiodarone, a heart medicine.  · Parasitic infections. These include schistosomiasis, which is caused by a flatworm.  · Long-term contact to certain toxins. These toxins include certain organic solvents, such as toluene and chloroform.  What increases the risk?  You are more likely to develop this condition if:  · You have certain types of viral hepatitis.  · You abuse alcohol, especially if you are female.  · You are overweight.  · You share needles.  · You have unprotected sex with someone who has viral hepatitis.  What are the signs or symptoms?  You may not have any signs and symptoms at first. Symptoms may not develop until the damage to your liver starts to get worse.  Early  symptoms may include:  · Weakness and tiredness (fatigue).  · Changes in sleep patterns or having trouble sleeping.  · Itchiness.  · Tenderness in the right-upper part of your abdomen.  · Weight loss and muscle loss.  · Nausea.  · Loss of appetite.  · Appearance of tiny blood vessels under the skin.  Later symptoms may include:  · Fatigue or weakness that is getting worse.  · Yellow skin and eyes (jaundice).  · Buildup of fluid in the abdomen (ascites). You may notice that your clothes are tight around your waist.  · Weight gain.  · Swelling of the feet and ankles (edema).  · Trouble breathing.  · Easy bruising and bleeding.  · Vomiting blood.  · Black or bloody stool.  · Mental confusion.  How is this diagnosed?  Your health care provider may suspect cirrhosis based on your symptoms and medical history, especially if you have other medical conditions or a history of alcohol abuse. Your health care provider will do a physical exam to feel your liver and to check for signs of cirrhosis. He or she may perform other tests, including:  · Blood tests to check:  ? For hepatitis B or C.  ? Kidney function.  ? Liver function.  · Imaging tests such as:  ? MRI or CT scan to look for changes seen in advanced cirrhosis.  ? Ultrasound to see if normal liver tissue is being replaced by scar tissue.  · A procedure in which a long needle is used to take a sample of liver   tissue to be checked in a lab (biopsy). Liver biopsy can confirm the diagnosis of cirrhosis.  How is this treated?  Treatment for this condition depends on how damaged your liver is and what caused the damage. It may include treating the symptoms of cirrhosis, or treating the underlying causes in order to slow the damage. Treatment may include:  · Making lifestyle changes, such as:  ? Eating a healthy diet. You may need to work with your health care provider or a diet and nutrition specialist (dietitian) to develop an eating plan.  ? Restricting salt  intake.  ? Maintaining a healthy weight.  ? Not abusing drugs or alcohol.  · Taking medicines to:  ? Treat liver infections or other infections.  ? Control itching.  ? Reduce fluid buildup.  ? Reduce certain blood toxins.  ? Reduce risk of bleeding from enlarged blood vessels in the stomach or esophagus (varices).  · Liver transplant. In this procedure, a liver from a donor is used to replace your diseased liver. This is done if cirrhosis has caused liver failure.  Other treatments and procedures may be done depending on the problems that you get from cirrhosis. Common problems include liver-related kidney failure (hepatorenal syndrome).  Follow these instructions at home:    · Take medicines only as told by your health care provider. Do not use medicines that are toxic to your liver. Ask your health care provider before taking any new medicines, including over-the-counter medicines.  · Rest as needed.  · Eat a well-balanced diet. Ask your health care provider or dietitian for more information.  · Limit your salt or water intake, if your health care provider asks you to do this.  · Do not drink alcohol. This is especially important if you are taking acetaminophen.  · Keep all follow-up visits as told by your health care provider. This is important.  Contact a health care provider if you:  · Have fatigue or weakness that is getting worse.  · Develop swelling of the hands, feet, legs, or face.  · Have a fever.  · Develop loss of appetite.  · Have nausea or vomiting.  · Develop jaundice.  · Develop easy bruising or bleeding.  Get help right away if you:  · Vomit bright red blood or a material that looks like coffee grounds.  · Have blood in your stools.  · Notice that your stools appear black and tarry.  · Become confused.  · Have chest pain or trouble breathing.  Summary  · Cirrhosis is chronic liver injury. Liver damage cannot be reversed. Common causes are hepatitis C and long-term alcohol abuse.  · Tests used to  diagnose cirrhosis include blood tests, imaging tests, and liver biopsy.  · Treatment for this condition involves treating the underlying cause. Avoid alcohol, drugs, salt, and medicines that may damage your liver.  · Contact your health care provider if you develop ascites, edema, jaundice, fever, nausea or vomiting, easy bruising or bleeding, or worsening fatigue.  This information is not intended to replace advice given to you by your health care provider. Make sure you discuss any questions you have with your health care provider.  Document Released: 06/09/2005 Document Revised: 09/29/2018 Document Reviewed: 04/29/2017  Elsevier Patient Education © 2020 [employer]

## 2019-01-23 NOTE — Progress Notes (Signed)
Medications and discharge instructions reviewed with patient, patient taken by wheelchair to car where son picked her up.

## 2019-01-23 NOTE — Discharge Summary (Signed)
Physician Discharge Summary [facility]    Patient: Katie Holland                   Admit date: 01/19/2019   DOB: [birth year]             Discharge date:01/23/2019/7:50 AM [id number]:[id number]                          PCP: Katie Ruddy, MD  Disposition:  HOME  Recommendations for Outpatient Follow-up:    Follow up: PCP and interventional radiologist, reevaluation for TIPS  Discharge Condition: Stable   Code Status:   Code Status: Full Code  Diet recommendation: Cardiac diet   Discharge Diagnoses:    Principal Problem:   Decompensated hepatic cirrhosis (Ong) Active Problems:   Tobacco dependence   Ascites   Pleural effusion   UTI (urinary tract infection)   History of Present Illness/ Hospital Course Katie Holland Summary:   Katie Holland a 53 y.o.femalewith medical history significant ofalcoholic liver cirrhosis with ascites and esophageal varices, hepatic encephalopathy, GERD presenting to the hospitalwith chief complaint of abdominal swelling and shortness of breath. Patient states she has had fluid in her abdomen in the past which had to be removed on several occasions. In addition, she is also had fluid in her right lung in the past which had to be removed. She ran out of her home medications including Lasix, spironolactone, and rifaximin 3 weeks ago. She continues to take lactulose. States for the past 1 week she has noted that her abdomen is getting more swollen and she is having shortness of breath. Denies any fevers or abdominal pain. Denies any chest pain. She is coughing a little. Denies any dysuria, urinary frequency, or urgency. States she has not consumed any alcohol since the beginning of this year. In LS:LHTD temperature 100.2 F on arrival, rectal temperature was checked and normal without patient receiving any antipyretic. Tachycardic, remainder ofvitalsstable. White count 3.9, hemoglobin 12.0, platelet count 82,000 (chronically low and consistent  with labs done in the past 3 months). Potassium 3.4. BUN 5, creatinine 0.6. AST 72, alk phos 187, T bili 4.8. LFTs are chronically elevated,T bili now worse. Lipase normal. UA with positive nitrite, trace leukocytes, 0-5 WBCs, and many bacteria. Urine culture pending. PT/INR pending. Chest x-ray showing moderate right pleural effusion, new. Patient received ceftriaxone in the ED.  IR consulted for right thoracentesis as well as paracentesis given patient's ascites and pleural effusion noted on imaging.  Questionable UTI on intake, continue ceftriaxone. Patient to be monitored overnight with likely disposition back home in the next 24 to 48 hours pending clinical course and successful paracentesis and thoracentesis per IR schedule. Will need outpatient follow up with Katie Holland ([facility]) per their schedule in the next few weeks.  Subjective:   The patient was seen and examined this morning, status post successful thoracentesis and paracentesis by IR.  No distress this morning.  Denies having any shortness of breath chest pain.  Denies any abdominal pain at this time. No issues overnight  -  status post thoracentesis yielding 2.4 L on 7/31/ 2020  - Paracentesis   01/22/2019 => 6.1L removed during paracentesis. Per verbal orders over phone pt will get 25g Albumin Q6hrs x 4 doses and 32m Lasix PO Q6hrs x 4 doses  Detailed summary/ IP Assessment & Plan:   Principal Problem:   Decompensated hepatic cirrhosis (HCC) Active Problems:   Tobacco dependence   Ascites  Pleural effusion   UTI (urinary tract infection)   Acutely decompensated alcoholic liver cirrhosis with recurrent ascitesand moderate right pleural effusion -Stable,  status post thoracentesis yielding 2.4 L on 9/31/ 2020 Paracentesis   01/22/2019 => 6.1L removed during paracentesis. Per verbal orders over phone pt will get 25g Albumin Q6hrs x 4 doses and 76m Lasix PO Q6hrs x 4 dose  Likely secondary to medication  nonadherence.  She ran out of her home medications including diuretics 3 weeks ago.  MELD 16 currently - 6.0% risk of mortality over 3 months  Patient will need to follow [facility] in the outpatient setting once stable  -Resume home Lasix, spironolactone, rifaximin, lactulose -PT/INR minimally elevated 19/1.6 respectively -Continue to monitor liver function with daily labs - AST minimally up today  UTI, POA, does not meet sepsis critieria -Much improved symptoms from dysuria burning denies any discharge improving frequency and urge Remains afebrile and without leukocytosis. UA with positive nitrite, trace leukocytes, 0-5 WBCs, and many bacteria. -Ceftriaxone initiated 7/30 - should finish 8/1 -Urine culture appears mixed flora, ? Not clean catch - will treat based on symptoms per IDSA guidelines  Hypokalemia, mild, stable Potassium 3.4.>>3.4 >> -Replacing orally  Tobacco use -Counseled to quit -NicoDerm patch  Code Status:FULL code Disposition Plan:Discharging home with instruction to follow-up with PCP and interventional radiologist and consideration of TIPS procedure. Consults called:IR Admission status:Inpatient    Procedures: -  status post thoracentesis yielding 2.4 L on 7/31/ 2020  - Paracentesis   01/22/2019 => 6.1L removed during paracentesis. Per verbal orders over phone pt will get 25g Albumin Q6hrs x 4 doses and 234mLasix PO Q6hrs x 4 doses  Discharge Instructions:   Discharge Instructions    Activity as tolerated - No restrictions   Complete by: As directed    Call MD for:  persistant nausea and vomiting   Complete by: As directed    Call MD for:  severe uncontrolled pain   Complete by: As directed    Call MD for:  temperature >100.4   Complete by: As directed    Diet - low sodium heart healthy   Complete by: As directed    Discharge instructions   Complete by: As directed    Please follow-up with interventional radiologist for further  evaluation of your liver failure, fluid collection in your abdomen.  For procedure called TIPS   Discharge instructions   Complete by: As directed    Follow-up with PCP and interventional radiologist for further evaluation of fluid buildup in your abdomen and chest.  Considering procedure: TIPS   Increase activity slowly   Complete by: As directed        Medication List    STOP taking these medications   acetaminophen 500 MG tablet Commonly known as: TYLENOL     TAKE these medications   furosemide 20 MG tablet Commonly known as: Lasix Take 2 tablets (40 mg total) by mouth 2 (two) times daily.   lactulose 10 GM/15ML solution Commonly known as: CHRONULAC Take 45 mLs (30 g total) by mouth 3 (three) times daily. What changed:   medication strength  additional instructions   multivitamin with minerals Tabs tablet Take 1 tablet by mouth daily.   nicotine 14 mg/24hr patch Commonly known as: NICODERM CQ - dosed in mg/24 hours Place 1 patch (14 mg total) onto the skin daily.   potassium chloride 10 MEQ tablet Commonly known as: K-DUR Take 2 tablets (20 mEq total) by mouth daily. What  changed: how much to take   rifaximin 550 MG Tabs tablet Commonly known as: XIFAXAN Take 1 tablet (550 mg total) by mouth 2 (two) times daily.   spironolactone 100 MG tablet Commonly known as: ALDACTONE Take 2 tablets (200 mg total) by mouth daily.       Allergies  Allergen Reactions   Contrast Media [Iodinated Diagnostic Agents] Itching and Swelling    Needs 13-hr prep   Latex Itching, Swelling and Other (See Comments)    No breathing impairment, however   Sulfa Antibiotics Itching, Swelling and Other (See Comments)    No breathing impairment, however     Procedures /Studies:   Dg Chest 1 View  Result Date: 01/22/2019 CLINICAL DATA:  History of alcoholic liver disease, recurrent ascites. Post thoracentesis EXAM: CHEST  1 VIEW COMPARISON:  the previous day's study FINDINGS: No  pneumothorax. Interval decrease in right effusion with small residual. Improved aeration at the right lung base. Left lung clear. Heart size and mediastinal contours are within normal limits. Visualized bones unremarkable. IMPRESSION: No pneumothorax post right thoracentesis. Electronically Signed   By: Lucrezia Europe M.D.   On: 01/22/2019 07:43   Ct Angio Abdomen W &/or Wo Contrast  Result Date: 01/01/2019 CLINICAL DATA:  53 year old with portal hypertension. Pre TIPS evaluation. EXAM: CT ANGIOGRAPHY ABDOMEN TECHNIQUE: Multidetector CT imaging of the abdomen was performed using the standard protocol during bolus administration of intravenous contrast. Multiplanar reconstructed images and MIPs were obtained and reviewed to evaluate the vascular anatomy. CONTRAST:  181m OMNIPAQUE IOHEXOL 350 MG/ML SOLN COMPARISON:  CT 04/25/2018 FINDINGS: VASCULAR Aorta: Normal caliber of the abdominal aorta and distal descending thoracic aorta. No significant atherosclerotic disease. Negative for dissection. Celiac: Celiac trunk is patent. There is a replaced left hepatic artery coming off the left gastric artery. Origin of the celiac trunk is widely patent. SMA: Limited evaluation of the origin due to motion artifact. SMA is patent. Renals: Bilateral renal arteries are patent without evidence of stenosis. IMA: IMA is patent. Inflow: Common iliac arteries are patent. The proximal internal and external iliac arteries are patent bilaterally. Veins: Main portal vein is patent but small. Portal vein measures 7 mm in diameter. Left and right portal veins are patent. SMV is patent. Splenic vein is patent. There are large esophageal varices which appear to be associated with a large left gastric vein. No significant gastro renal shunt. No significant gastric varices. Prominent venous structures along the anterior left upper abdomen. The left and middle hepatic veins may have a shared origin. Separate origin for the right hepatic vein. IVC  is patent. Proximal iliac veins are patent. There is a prominent right gonadal vein. Review of the MIP images confirms the above findings. NON-VASCULAR Lower chest: Large right pleural effusion with extensive compressive atelectasis in the right lung base. Hepatobiliary: Liver is small and nodular. Findings compatible with cirrhosis. No suspicious liver lesion. Fluid around the gallbladder. Difficult to exclude gallbladder wall thickening. Pancreas: Unremarkable. No pancreatic ductal dilatation or surrounding inflammatory changes. Spleen: No significant splenic enlargement. No focal splenic abnormality. Adrenals/Urinary Tract: Adrenal tissue is unremarkable. Right adrenal gland is not well visualized. Normal appearance of the kidneys. Punctate low-density structure in the mid right kidney could represent a cyst but too small to definitively characterize. Stomach/Bowel: Varices around the distal esophagus. Mild wall thickening in the distal stomach. No evidence for bowel obstruction. Limited evaluation of the right colon due to surrounding fluid and under distension. Lymphatic: No significant lymph node enlargement in  the abdomen. Other: Large amount of abdominal ascites. Musculoskeletal: Pedicle screw and rod fixation at L4-L5. No acute bone abnormality. IMPRESSION: VASCULAR 1. Large esophageal varices associated the left gastric vein. 2. Portal venous system is patent. Main portal vein slightly small measuring 7 mm in diameter. Hepatic veins are patent. 3. No significant gastro renal shunt. 4. Arterial structures are patent without any significant atherosclerotic disease. NON-VASCULAR 1. Cirrhosis with large amount of ascites. Large right pleural effusion. 2. Compressive atelectasis in the right lung secondary to the large right pleural effusion. Electronically Signed   By: Markus Daft M.D.   On: 01/01/2019 15:06   Dg Chest Portable 1 View  Result Date: 01/20/2019 CLINICAL DATA:  Cirrhosis, abdominal  swelling/pain, shortness of breath EXAM: PORTABLE CHEST 1 VIEW COMPARISON:  12/10/2018 and 06/04/2018 FINDINGS: Moderate right pleural effusion, new. Left lung is clear. No pneumothorax. The heart is normal in size. Sclerotic lesion along the right scapula, unchanged from 2019. IMPRESSION: Moderate right pleural effusion, new. Electronically Signed   By: Julian Hy M.D.   On: 01/20/2019 00:20   Ir Thoracentesis Asp Pleural Space W/img Guide  Result Date: 01/21/2019 INDICATION: Patient with history of cirrhosis, ascites. Now with right pleural effusion. Request is made for therapeutic right thoracentesis. EXAM: ULTRASOUND GUIDED THERAPEUTIC RIGHT THORACENTESIS MEDICATIONS: 10 mL 1% lidocaine COMPLICATIONS: None immediate. PROCEDURE: An ultrasound guided thoracentesis was thoroughly discussed with the patient and questions answered. The benefits, risks, alternatives and complications were also discussed. The patient understands and wishes to proceed with the procedure. Written consent was obtained. Ultrasound was performed to localize and mark an adequate pocket of fluid in the right chest. The area was then prepped and draped in the normal sterile fashion. 1% Lidocaine was used for local anesthesia. Under ultrasound guidance a 6 Fr Safe-T-Centesis catheter was introduced. Thoracentesis was performed. The catheter was removed and a dressing applied. FINDINGS: A total of approximately 2.4 liters of yellow fluid was removed. IMPRESSION: Successful ultrasound guided therapeutic right thoracentesis yielding 2.4 liters of pleural fluid. Ready by: Brynda Greathouse PA-C Electronically Signed   By: Lucrezia Europe M.D.   On: 01/21/2019 16:12    Subjective:   Patient was seen and examined 01/23/2019, 7:50 AM Patient stable today. No acute distress.  No issues overnight Stable for discharge.  Discharge Exam:    Vitals:   01/22/19 1513 01/22/19 1655 01/22/19 2254 01/23/19 0730  BP: 103/72 104/78 107/80 90/62   Pulse:  (!) 101 (!) 110 (!) 103  Resp:  _0 Temp:  98.4 F (36.9 C) 98.5 F (36.9 C) 98.5 F (36.9 C)  TempSrc:  Oral Oral Oral  SpO2:  95% 97% 92%  Weight:      Height:        General: Pt lying comfortably in bed & appears in no obvious distress. Cardiovascular: S1 & S2 heard, RRR, S1/S2 +. No murmurs, rubs, gallops or clicks. No JVD or pedal edema. Respiratory: Clear to auscultation without wheezing, rhonchi or crackles. No increased work of breathing. Abdominal:  Non-distended, non-tender & soft. No organomegaly or masses appreciated. Normal bowel sounds heard. CNS: Alert and oriented. No focal deficits. Extremities: no edema, no cyanosis    The results of significant diagnostics from this hospitalization (including imaging, microbiology, ancillary and laboratory) are listed below for reference.      Microbiology:   Recent Results (from the past 240 hour(s))  Urine culture     Status: Abnormal   Collection Time: 01/19/19 11:48  PM   Specimen: Urine, Random  Result Value Ref Range Status   Specimen Description URINE, RANDOM  Final   Special Requests   Final    NONE Performed at [facility], [address], [place], [place] [address]    Culture MULTIPLE SPECIES PRESENT, SUGGEST RECOLLECTION (A)  Final   Report Status 01/20/2019 FINAL  Final  SARS Coronavirus 2 (CEPHEID- Performed in [facility]), Hosp Order     Status: None   Collection Time: 01/20/19  1:02 AM   Specimen: Nasopharyngeal Swab  Result Value Ref Range Status   SARS Coronavirus 2 NEGATIVE NEGATIVE Final    Comment: (NOTE) If result is NEGATIVE SARS-CoV-2 target nucleic acids are NOT DETECTED. The SARS-CoV-2 RNA is generally detectable in upper and lower  respiratory specimens during the acute phase of infection. The lowest  concentration of SARS-CoV-2 viral copies this assay can detect is 250  copies / mL. A negative result does not preclude SARS-CoV-2 infection  and should  not be used as the sole basis for treatment or other  patient management decisions.  A negative result may occur with  improper specimen collection / handling, submission of specimen other  than nasopharyngeal swab, presence of viral mutation(s) within the  areas targeted by this assay, and inadequate number of viral copies  (<250 copies / mL). A negative result must be combined with clinical  observations, patient history, and epidemiological information. If result is POSITIVE SARS-CoV-2 target nucleic acids are DETECTED. The SARS-CoV-2 RNA is generally detectable in upper and lower  respiratory specimens dur ing the acute phase of infection.  Positive  results are indicative of active infection with SARS-CoV-2.  Clinical  correlation with patient history and other diagnostic information is  necessary to determine patient infection status.  Positive results do  not rule out bacterial infection or co-infection with other viruses. If result is PRESUMPTIVE POSTIVE SARS-CoV-2 nucleic acids MAY BE PRESENT.   A presumptive positive result was obtained on the submitted specimen  and confirmed on repeat testing.  While 2019 novel coronavirus  (SARS-CoV-2) nucleic acids may be present in the submitted sample  additional confirmatory testing may be necessary for epidemiological  and / or clinical management purposes  to differentiate between  SARS-CoV-2 and other Sarbecovirus currently known to infect humans.  If clinically indicated additional testing with an alternate test  methodology [phone]) is advised. The SARS-CoV-2 RNA is generally  detectable in upper and lower respiratory sp ecimens during the acute  phase of infection. The expected result is Negative. Fact Sheet for Patients:  StrictlyIdeas.no Fact Sheet for Healthcare Providers: BankingDealers.co.za This test is not yet approved or cleared by the [place] FDA and has been  authorized for detection and/or diagnosis of SARS-CoV-2 by FDA under an Emergency Use Authorization (EUA).  This EUA will remain in effect (meaning this test can be used) for the duration of the COVID-19 declaration under Section 564(b)(1) of the Act, 21 U.S.C. section 360bbb-3(b)(1), unless the authorization is terminated or revoked sooner. Performed at [facility], [place] [address], [place], [place] [address]      Labs:   CBC: Recent Labs  Lab 01/19/19 1702 01/20/19 0246 01/21/19 0626 01/22/19 0528 01/23/19 0717  WBC 3.9* 4.1 4.2 4.1 3.0*  HGB 12.0 11.2* 10.4* 10.9* 9.5*  HCT 35.3* 32.2* 30.4* 31.8* 27.9*  MCV 82.7 81.7 81.9 83.0 83.0  PLT 82* 68* 68* 66* 55*   Basic Metabolic Panel: Recent Labs  Lab 01/19/19 1702 01/20/19 0100 01/20/19 0246 01/21/19 0626 01/22/19 0528  NA 137  --  137 138 138  K 3.4*  --  3.2* 3.4* 3.2*  CL 104  --  106 105 105  CO2 22  --  _0 GLUCOSE 81  --  68* 110* 102*  BUN 5*  --  7 7 <5*  CREATININE 0.68  --  0.66 0.79 0.68  CALCIUM 8.9  --  8.5* 8.4* 8.1*  MG  --  1.7  --   --   --    Liver Function Tests: Recent Labs  Lab 01/19/19 1702 01/20/19 0246 01/21/19 0626 01/22/19 0528  AST 72* 59* 50* 50*  ALT _1 ALKPHOS 187* 180* 171* 165*  BILITOT 4.8* 4.3* 3.1* 2.7*  PROT 7.1 6.2* 5.9* 5.9*  ALBUMIN 2.6* 2.3* 2.1* 2.0*   BNP (last 3 results) No results for input(s): BNP in the last 8760 hours. Cardiac Enzymes: No results for input(s): CKTOTAL, CKMB, CKMBINDEX, TROPONINI in the last 168 hours. CBG: Recent Labs  Lab 01/22/19 0833 01/22/19 1208 01/22/19 1657 01/22/19 2058 01/23/19 0732  GLUCAP 83 93 97 105* 114*  Urinalysis    Component Value Date/Time   COLORURINE ORANGE (A) 01/19/2019 2030   APPEARANCEUR TURBID (A) 01/19/2019 2030   LABSPEC 1.025 01/19/2019 2030   PHURINE 6.5 01/19/2019 2030   GLUCOSEU 100 (A) 01/19/2019 2030   HGBUR NEGATIVE 01/19/2019 2030   BILIRUBINUR LARGE (A) 01/19/2019  2030   KETONESUR 15 (A) 01/19/2019 2030   PROTEINUR 100 (A) 01/19/2019 2030   NITRITE POSITIVE (A) 01/19/2019 2030   LEUKOCYTESUR TRACE (A) 01/19/2019 2030    Time coordinating discharge: Over 40 minutes  SIGNED: Deatra James, MD, FACP, [facility]. [facility],  Pager [phone]  If 7PM-7AM, please contact night-coverage [email or web address], Password [facility] 01/23/2019, 7:50 AM

## 2019-01-24 ENCOUNTER — Telehealth: Payer: Self-pay

## 2019-01-24 NOTE — Telephone Encounter (Signed)
Called daughter(Nichole) and left message to please call reference pre-procedure information for her Mom that I need to go over with her

## 2019-01-24 NOTE — Telephone Encounter (Signed)
Patient scheduled for EGD at [facility] 01/31/19 patient to arrive at 6:00am. Pre-visit information reviewed on the phone with daughter and she will pickup the written copy at our front desk. Scheduled for COVID testing 01/27/19 between 9am-3pm at [facility]. Patient knows to quarantine after testing until procedure.

## 2019-01-25 ENCOUNTER — Other Ambulatory Visit (HOSPITAL_COMMUNITY): Payer: Self-pay | Admitting: Interventional Radiology

## 2019-01-25 DIAGNOSIS — K7031 Alcoholic cirrhosis of liver with ascites: Secondary | ICD-10-CM

## 2019-01-27 ENCOUNTER — Other Ambulatory Visit (HOSPITAL_COMMUNITY)
Admit: 2019-01-27 | Discharge: 2019-01-27 | Disposition: A | Discharge status: Home / Self Care | Payer: Medicare Other | Source: Ambulatory Visit | Attending: Gastroenterology | Admitting: Gastroenterology

## 2019-01-27 ENCOUNTER — Other Ambulatory Visit: Payer: Self-pay

## 2019-01-27 ENCOUNTER — Encounter (HOSPITAL_COMMUNITY): Payer: Self-pay | Admitting: *Deleted

## 2019-01-27 DIAGNOSIS — Z20828 Contact with and (suspected) exposure to other viral communicable diseases: Secondary | ICD-10-CM | POA: Diagnosis not present

## 2019-01-27 DIAGNOSIS — Z01812 Encounter for preprocedural laboratory examination: Secondary | ICD-10-CM | POA: Insufficient documentation

## 2019-01-27 LAB — SARS CORONAVIRUS 2 (TAT 6-24 HRS): SARS Coronavirus 2: NEGATIVE

## 2019-01-28 ENCOUNTER — Telehealth (HOSPITAL_COMMUNITY): Payer: Self-pay | Admitting: Radiology

## 2019-01-28 NOTE — Anesthesia Preprocedure Evaluation (Addendum)
Anesthesia Evaluation  Patient identified by MRN, date of birth, ID band Patient awake    Reviewed: Allergy & Precautions, NPO status , Patient's Chart, lab work & pertinent test results  History of Anesthesia Complications Negative for: history of anesthetic complications  Airway Mallampati: II  TM Distance: >3 FB Neck ROM: Full    Dental  (+) Dental Advisory Given   Pulmonary Current Smoker and Patient abstained from smoking.,    Pulmonary exam normal        Cardiovascular negative cardio ROS Normal cardiovascular exam     Neuro/Psych negative neurological ROS  negative psych ROS   GI/Hepatic GERD  Controlled,(+) Cirrhosis   Esophageal Varices  substance abuse  alcohol use,   Endo/Other   Hypokalemia   Renal/GU negative Renal ROS     Musculoskeletal negative musculoskeletal ROS (+)   Abdominal   Peds  Hematology  (+) anemia ,  Pancytopenia     Anesthesia Other Findings Chronic back pain   Reproductive/Obstetrics                            Anesthesia Physical Anesthesia Plan  ASA: IV  Anesthesia Plan: MAC   Post-op Pain Management:    Induction: Intravenous  PONV Risk Score and Plan: 2 and Propofol infusion and Treatment may vary due to age or medical condition  Airway Management Planned: Nasal Cannula and Natural Airway  Additional Equipment: None  Intra-op Plan:   Post-operative Plan:   Informed Consent: I have reviewed the patients History and Physical, chart, labs and discussed the procedure including the risks, benefits and alternatives for the proposed anesthesia with the patient or authorized representative who has indicated his/her understanding and acceptance.       Plan Discussed with: CRNA and Anesthesiologist  Anesthesia Plan Comments:        Anesthesia Quick Evaluation

## 2019-01-28 NOTE — Telephone Encounter (Signed)
Called pt to schedule TIPS with [facility]. No answer, no VM. JM

## 2019-01-31 ENCOUNTER — Other Ambulatory Visit: Payer: Self-pay

## 2019-01-31 ENCOUNTER — Ambulatory Visit (HOSPITAL_COMMUNITY): Payer: Medicare Other | Admitting: Anesthesiology

## 2019-01-31 ENCOUNTER — Encounter (HOSPITAL_COMMUNITY): Payer: Self-pay | Admitting: *Deleted

## 2019-01-31 ENCOUNTER — Ambulatory Visit (HOSPITAL_COMMUNITY)
Admission: RE | Admit: 2019-01-31 | Discharge: 2019-01-31 | Disposition: A | Discharge status: Home / Self Care | Payer: Medicare Other | Attending: Gastroenterology | Admitting: Gastroenterology

## 2019-01-31 ENCOUNTER — Encounter (HOSPITAL_COMMUNITY)
Admission: RE | Disposition: A | Discharge status: Home / Self Care | Payer: Self-pay | Source: Home / Self Care | Attending: Gastroenterology

## 2019-01-31 DIAGNOSIS — K7030 Alcoholic cirrhosis of liver without ascites: Secondary | ICD-10-CM | POA: Diagnosis not present

## 2019-01-31 DIAGNOSIS — K766 Portal hypertension: Secondary | ICD-10-CM | POA: Diagnosis not present

## 2019-01-31 DIAGNOSIS — Z09 Encounter for follow-up examination after completed treatment for conditions other than malignant neoplasm: Secondary | ICD-10-CM | POA: Diagnosis not present

## 2019-01-31 DIAGNOSIS — F17210 Nicotine dependence, cigarettes, uncomplicated: Secondary | ICD-10-CM | POA: Diagnosis not present

## 2019-01-31 DIAGNOSIS — D649 Anemia, unspecified: Secondary | ICD-10-CM | POA: Diagnosis not present

## 2019-01-31 DIAGNOSIS — Z79899 Other long term (current) drug therapy: Secondary | ICD-10-CM | POA: Insufficient documentation

## 2019-01-31 DIAGNOSIS — I8500 Esophageal varices without bleeding: Secondary | ICD-10-CM | POA: Diagnosis not present

## 2019-01-31 DIAGNOSIS — I8510 Secondary esophageal varices without bleeding: Secondary | ICD-10-CM

## 2019-01-31 DIAGNOSIS — K3189 Other diseases of stomach and duodenum: Secondary | ICD-10-CM

## 2019-01-31 HISTORY — PX: ESOPHAGOGASTRODUODENOSCOPY (EGD) WITH PROPOFOL: SHX5813

## 2019-01-31 SURGERY — ESOPHAGOGASTRODUODENOSCOPY (EGD) WITH PROPOFOL
Anesthesia: Monitor Anesthesia Care

## 2019-01-31 MED ORDER — PROPOFOL 500 MG/50ML IV EMUL
INTRAVENOUS | Status: DC | PRN
  Administered 2019-01-31: 120 ug/kg/min via INTRAVENOUS

## 2019-01-31 MED ORDER — LACTATED RINGERS IV SOLN
INTRAVENOUS | Status: DC
  Administered 2019-01-31: 07:00:00 1000 mL via INTRAVENOUS

## 2019-01-31 MED ORDER — PROPOFOL 10 MG/ML IV BOLUS
INTRAVENOUS | Status: AC
  Filled 2019-01-31: qty 40

## 2019-01-31 MED ORDER — LIDOCAINE 2% (20 MG/ML) 5 ML SYRINGE
INTRAMUSCULAR | Status: DC | PRN
  Administered 2019-01-31: 100 mg via INTRAVENOUS

## 2019-01-31 MED ORDER — PROPOFOL 10 MG/ML IV BOLUS
INTRAVENOUS | Status: DC | PRN
  Administered 2019-01-31 (×3): 20 mg via INTRAVENOUS

## 2019-01-31 SURGICAL SUPPLY — 14 items

## 2019-01-31 NOTE — Anesthesia Procedure Notes (Signed)
Date/Time: 01/31/2019 7:23 AM Performed by: Sharlette Dense, CRNA Oxygen Delivery Method: Nasal cannula

## 2019-01-31 NOTE — Transfer of Care (Signed)
Immediate Anesthesia Transfer of Care Note  Patient: Katie Holland  Procedure(s) Performed: ESOPHAGOGASTRODUODENOSCOPY (EGD) WITH PROPOFOL (N/A )  Patient Location: Endoscopy Unit  Anesthesia Type:MAC  Level of Consciousness: awake and alert   Airway & Oxygen Therapy: Patient Spontanous Breathing and Patient connected to nasal cannula oxygen  Post-op Assessment: Report given to RN and Post -op Vital signs reviewed and stable  Post vital signs: Reviewed and stable  Last Vitals:  Vitals Value Taken Time  BP    Temp    Pulse 98 01/31/19 0741  Resp 30 01/31/19 0741  SpO2 98 % 01/31/19 0741  Vitals shown include unvalidated device data.  Last Pain:  Vitals:   01/31/19 0640  TempSrc: Oral  PainSc: 0-No pain         Complications: No apparent anesthesia complications

## 2019-01-31 NOTE — Discharge Instructions (Signed)
YOU HAD AN ENDOSCOPIC PROCEDURE TODAY: Refer to the procedure report and other information in the discharge instructions given to you for any specific questions about what was found during the examination. If this information does not answer your questions, please call [place] office at [phone] to clarify.   YOU SHOULD EXPECT: Some feelings of bloating in the abdomen. Passage of more gas than usual. Walking can help get rid of the air that was put into your GI tract during the procedure and reduce the bloating. If you had a lower endoscopy (such as a colonoscopy or flexible sigmoidoscopy) you may notice spotting of blood in your stool or on the toilet paper. Some abdominal soreness may be present for a day or two, also.  DIET: Your first meal following the procedure should be a light meal and then it is ok to progress to your normal diet. A half-sandwich or bowl of soup is an example of a good first meal. Heavy or fried foods are harder to digest and may make you feel nauseous or bloated. Drink plenty of fluids but you should avoid alcoholic beverages for 24 hours. If you had a esophageal dilation, please see attached instructions for diet.    ACTIVITY: Your care partner should take you home directly after the procedure. You should plan to take it easy, moving slowly for the rest of the day. You can resume normal activity the day after the procedure however YOU SHOULD NOT DRIVE, use power tools, machinery or perform tasks that involve climbing or major physical exertion for 24 hours (because of the sedation medicines used during the test).   SYMPTOMS TO REPORT IMMEDIATELY: A gastroenterologist can be reached at any hour. Please call [phone]  for any of the following symptoms:   Following upper endoscopy (EGD, EUS, ERCP, esophageal dilation) Vomiting of blood or coffee ground material  New, significant abdominal pain  New, significant chest pain or pain under the shoulder blades  Painful or  persistently difficult swallowing  New shortness of breath  Black, tarry-looking or red, bloody stools  FOLLOW UP:  If any biopsies were taken you will be contacted by phone or by letter within the next 1-3 weeks. Call [phone]  if you have not heard about the biopsies in 3 weeks.  Please also call with any specific questions about appointments or follow up tests.  

## 2019-01-31 NOTE — Anesthesia Postprocedure Evaluation (Signed)
Anesthesia Post Note  Patient: Katie Holland  Procedure(s) Performed: ESOPHAGOGASTRODUODENOSCOPY (EGD) WITH PROPOFOL (N/A )     Patient location during evaluation: PACU Anesthesia Type: MAC Level of consciousness: awake and alert Pain management: pain level controlled Vital Signs Assessment: post-procedure vital signs reviewed and stable Respiratory status: spontaneous breathing, nonlabored ventilation and respiratory function stable Cardiovascular status: stable and blood pressure returned to baseline Anesthetic complications: no    Last Vitals:  Vitals:   01/31/19 0750 01/31/19 0800  BP: (!) 143/97 (!) 151/100  Pulse: 96 92  Resp: (!) 29 (!) 22  Temp:    SpO2: 93% 94%    Last Pain:  Vitals:   01/31/19 0800  TempSrc:   PainSc: 0-No pain                 Audry Pili

## 2019-01-31 NOTE — H&P (Signed)
HPI :  53 y/o female with a history of alcoholic cirrhosis, bleeding esophageal varices, ascites, here for surveillance EGD. She's had 25 bands placed since last year for bleeding varices, not on beta blocker due to ascites and hepatic hydrothorax. Last EGD 3 months ago. She has not been taking her medications reliably, reports she has run out. Otherwise feels at baseline-  Denies chest pains or shortness of breath.   Past Medical History:  Diagnosis Date   Alcoholic liver disease ([place])    Chronic back pain    on disability   Cirrhosis of liver ([place])    Esophageal varices ([facility])    GERD (gastroesophageal reflux disease)    Jaundice      Past Surgical History:  Procedure Laterality Date   BACK SURGERY     ESOPHAGEAL BANDING  04/25/2018   Procedure: ESOPHAGEAL BANDING;  Surgeon: Yetta Flock, MD;  Location[facility] END[facility]COPY;  Service: Gastroenterology;;   ESOPHAGEAL BANDING  04/27/2018   Procedure: ESOPHAGEAL BANDING;  Surgeon: Milus Banister, MD;  Location: [place];  Service: Endoscopy;;   ESOPHAGEAL BANDING  06/02/2018   Procedure: ESOPHAGEAL BANDING;  Surgeon: Doran Stabler, MD;  Location: [place];  Service: Gastroenterology;;   ESOPHAGEAL BANDING N/A 07/07/2018   Procedure: ESOPHAGEAL BANDING;  Surgeon: Yetta Flock, MD;  Location: [facility] ENDOSCOPY;  Service: Gastroenterology;  Laterality: N/A;   ESOPHAGEAL BANDING  07/27/2018   Procedure: ESOPHAGEAL BANDING;  Surgeon: Yetta Flock, MD;  Location: [facility] ENDOSCOPY;  Service: Gastroenterology;;   ESOPHAGEAL BANDING  08/30/2018   Procedure: ESOPHAGEAL BANDING;  Surgeon: Yetta Flock, MD;  Location: [facility] ENDOSCOPY;  Service: Gastroenterology;;   ESOPHAGOGASTRODUODENOSCOPY N/A 04/27/2018   Procedure: ESOPHAGOGASTRODUODENOSCOPY (EGD);  Surgeon: Milus Banister, MD;  Location: [facility] ENDOSCOPY;  Service: Endoscopy;  Laterality: N/A;   ESOPHAGOGASTRODUODENOSCOPY (EGD) WITH PROPOFOL N/A 04/25/2018   Procedure: ESOPHAGOGASTRODUODENOSCOPY (EGD) WITH PROPOFOL;  Surgeon: Yetta Flock, MD;  Location: [place];  Service: Gastroenterology;  Laterality: N/A;   ESOPHAGOGASTRODUODENOSCOPY (EGD) WITH PROPOFOL N/A 06/02/2018   Procedure: ESOPHAGOGASTRODUODENOSCOPY (EGD) WITH PROPOFOL;  Surgeon: Doran Stabler, MD;  Location: [place];  Service: Gastroenterology;  Laterality: N/A;   ESOPHAGOGASTRODUODENOSCOPY (EGD) WITH PROPOFOL N/A 07/07/2018   Procedure: ESOPHAGOGASTRODUODENOSCOPY (EGD) WITH PROPOFOL;  Surgeon: Yetta Flock, MD;  Location: [facility] ENDOSCOPY;  Service: Gastroenterology;  Laterality: N/A;   ESOPHAGOGASTRODUODENOSCOPY (EGD) WITH PROPOFOL N/A 07/27/2018   Procedure: ESOPHAGOGASTRODUODENOSCOPY (EGD) WITH PROPOFOL;  Surgeon: Yetta Flock, MD;  Location: [facility] ENDOSCOPY;  Service: Gastroenterology;  Laterality: N/A;   ESOPHAGOGASTRODUODENOSCOPY (EGD) WITH PROPOFOL N/A 08/30/2018   Procedure: ESOPHAGOGASTRODUODENOSCOPY (EGD) WITH PROPOFOL;  Surgeon: Yetta Flock, MD;  Location: [facility] ENDOSCOPY;  Service: Gastroenterology;  Laterality: N/A;   ESOPHAGOGASTRODUODENOSCOPY (EGD) WITH PROPOFOL N/A 11/08/2018   Procedure: ESOPHAGOGASTRODUODENOSCOPY (EGD) WITH PROPOFOL;  Surgeon: Yetta Flock, MD;  Location: [facility] ENDOSCOPY;  Service: Gastroenterology;  Laterality: N/A;   IR PARACENTESIS  04/26/2018   IR PARACENTESIS  05/18/2018   IR PARACENTESIS  05/31/2018   IR PARACENTESIS  06/30/2018   IR PARACENTESIS  08/05/2018   IR PARACENTESIS  08/31/2018   IR PARACENTESIS  10/08/2018   IR PARACENTESIS  10/28/2018   IR RADIOLOGIST EVAL & MGMT  12/23/2018   IR THORACENTESIS ASP PLEURAL SPACE W/IMG GUIDE  12/10/2018   IR THORACENTESIS ASP PLEURAL SPACE W/IMG GUIDE  01/21/2019   Family History  Problem Relation Age of Onset   Colon cancer Neg Hx    Esophageal cancer Neg  Hx    Social History   Tobacco Use   Smoking status: Current Every Day Smoker    Packs/day:  0.75    Years: 20.00    Pack years: 15.00    Types: Cigarettes   Smokeless tobacco: Never Used  Substance Use Topics   Alcohol use: Not Currently    Comment: denies h/o withdrawal, quit 05/23/2018   Drug use: Not Currently   Current Facility-Administered Medications  Medication Dose Route Frequency Provider Last Rate Last Dose   lactated ringers infusion   Intravenous Continuous Son Barkan, Carlota Raspberry, MD 10 mL/hr at 01/31/19 0653 1,000 mL at 01/31/19 4315   Allergies  Allergen Reactions   Contrast Media [Iodinated Diagnostic Agents] Itching and Swelling    Needs 13-hr prep   Latex Itching, Swelling and Other (See Comments)    No breathing impairment, however   Sulfa Antibiotics Itching, Swelling and Other (See Comments)    No breathing impairment, however     Review of Systems: All systems reviewed and negative except where noted in HPI.    Dg Chest 1 View  Result Date: 01/22/2019 CLINICAL DATA:  History of alcoholic liver disease, recurrent ascites. Post thoracentesis EXAM: CHEST  1 VIEW COMPARISON:  the previous day's study FINDINGS: No pneumothorax. Interval decrease in right effusion with small residual. Improved aeration at the right lung base. Left lung clear. Heart size and mediastinal contours are within normal limits. Visualized bones unremarkable. IMPRESSION: No pneumothorax post right thoracentesis. Electronically Signed   By: Lucrezia Europe M.D.   On: 01/22/2019 07:43   US Paracentesis  Result Date: 01/23/2019 INDICATION: Patient with history of ETOH cirrhosis recurrent ascites. Request for therapeutic paracentesis today in IR. EXAM: ULTRASOUND GUIDED therapeutic PARACENTESIS MEDICATIONS: 10 mL 1% lidocaine COMPLICATIONS: None immediate. PROCEDURE: Informed written consent was obtained from the patient after a discussion of the risks, benefits and alternatives to treatment. A timeout was performed prior to the initiation of the procedure. Initial ultrasound scanning  demonstrates a large amount of ascites within the left lower abdominal quadrant. The left lower abdomen was prepped and draped in the usual sterile fashion. 1% lidocaine was used for local anesthesia. Following this, a 19 gauge, 7-cm, Yueh catheter was introduced. An ultrasound image was saved for documentation purposes. The paracentesis was performed. The catheter was removed and a dressing was applied. The patient tolerated the procedure well without immediate post procedural complication. FINDINGS: A total of approximately 6.1 L of clear yellow fluid was removed. IMPRESSION: Successful ultrasound-guided paracentesis yielding 6.1 liters of peritoneal fluid. Read by Candiss Norse, PA-C Electronically Signed   By: Lucrezia Europe M.D.   On: 01/22/2019 15:36   Dg Chest Portable 1 View  Result Date: 01/20/2019 CLINICAL DATA:  Cirrhosis, abdominal swelling/pain, shortness of breath EXAM: PORTABLE CHEST 1 VIEW COMPARISON:  12/10/2018 and 06/04/2018 FINDINGS: Moderate right pleural effusion, new. Left lung is clear. No pneumothorax. The heart is normal in size. Sclerotic lesion along the right scapula, unchanged from 2019. IMPRESSION: Moderate right pleural effusion, new. Electronically Signed   By: Julian Hy M.D.   On: 01/20/2019 00:20   Ir Thoracentesis Asp Pleural Space W/img Guide  Result Date: 01/21/2019 INDICATION: Patient with history of cirrhosis, ascites. Now with right pleural effusion. Request is made for therapeutic right thoracentesis. EXAM: ULTRASOUND GUIDED THERAPEUTIC RIGHT THORACENTESIS MEDICATIONS: 10 mL 1% lidocaine COMPLICATIONS: None immediate. PROCEDURE: An ultrasound guided thoracentesis was thoroughly discussed with the patient and questions answered. The benefits, risks, alternatives and complications were  also discussed. The patient understands and wishes to proceed with the procedure. Written consent was obtained. Ultrasound was performed to localize and mark an adequate pocket  of fluid in the right chest. The area was then prepped and draped in the normal sterile fashion. 1% Lidocaine was used for local anesthesia. Under ultrasound guidance a 6 Fr Safe-T-Centesis catheter was introduced. Thoracentesis was performed. The catheter was removed and a dressing applied. FINDINGS: A total of approximately 2.4 liters of yellow fluid was removed. IMPRESSION: Successful ultrasound guided therapeutic right thoracentesis yielding 2.4 liters of pleural fluid. Ready by: Brynda Greathouse PA-C Electronically Signed   By: Lucrezia Europe M.D.   On: 01/21/2019 16:12    Physical Exam: BP (!) 151/95    Pulse 84    Temp 98.7 F (37.1 C) (Oral)    Resp 19    Ht 5\' 4"  (1.626 m)    Wt 69.6 kg    SpO2 99%    BMI 26.34 kg/m  Constitutional: Pleasant, female in no acute distress. Cardiovascular: Normal rate, regular rhythm.  Pulmonary/chest: Effort normal and breath sounds normal.  Abdominal: Soft,  Mildly distended nontender. . There are no masses palpable.  Extremities: no edema Lymphadenopathy: No cervical adenopathy noted. Neurological: Alert and oriented to person place and time. Skin: Skin is warm and dry. No rashes noted. Psychiatric: Normal mood and affect. Behavior is normal.   ASSESSMENT AND PLAN: 53 y/o female with decompensated alcoholic cirrhosis, here for surveillance EGD with banding of varices if needed given history of severe recurrent bleeding from varices, not on beta blocker due to refractory ascites. I have discussed risks / benefits of EGD and anesthesia with her, she wishes to proceed. Further recommendations pending the results.  Page Park Cellar, MD [facility]

## 2019-01-31 NOTE — Op Note (Signed)
[facility] Patient Name: Katie Holland Procedure Date: 01/31/2019 MRN: [id number] Attending MD: Carlota Raspberry. Havery Moros , MD Date of Birth: 10-20-1965 CSN: [id number] Age: 53 Admit Type: Inpatient Procedure:                Upper GI endoscopy Indications:              Follow-up of decompensated cirrhosis with                            esophageal varices with bleeding - total of 25                            bands placed since Nov 2019 for eradication, last                            exam 3 months ago showed no varices amenable to                            therapy Providers:                Carlota Raspberry. Havery Moros, MD, Ashley Jacobs, RN,                            Vista Lawman, RN, Ladona Ridgel, Technician Referring MD:              Medicines:                Monitored Anesthesia Care Complications:            No immediate complications. Estimated blood loss:                            Minimal. Estimated Blood Loss:     Estimated blood loss was minimal. Procedure:                Pre-Anesthesia Assessment:                           - Prior to the procedure, a History and Physical                            was performed, and patient medications and                            allergies were reviewed. The patient's tolerance of                            previous anesthesia was also reviewed. The risks                            and benefits of the procedure and the sedation                            options and risks were discussed with the patient.  All questions were answered, and informed consent                            was obtained. Prior Anticoagulants: The patient has                            taken no previous anticoagulant or antiplatelet                            agents. ASA Grade Assessment: III - A patient with                            severe systemic disease. After reviewing the risks                            and benefits, the  patient was deemed in                            satisfactory condition to undergo the procedure.                           After obtaining informed consent, the endoscope was                            passed under direct vision. Throughout the                            procedure, the patient's blood pressure, pulse, and                            oxygen saturations were monitored continuously. The                            GIF-H190 ([id number]) Olympus gastroscope was                            introduced through the mouth, and advanced to the                            second part of duodenum. The upper GI endoscopy was                            accomplished without difficulty. The patient                            tolerated the procedure well. Scope In: Scope Out: Findings:      The Z-line was regular and was found 35 cm from the incisors.      The exam of the esophagus showed multple areas of scarring from prior       banding, but was otherwise normal. No obvious varices noted, no       pathology noted to warrant banding.      Diffuse congested mucosa was found in the entire examined stomach       secondary to  portal hypertension.      The exam of the stomach was otherwise normal.      The duodenal bulb and second portion of the duodenum were edematous from       portal hypertension but otherwise normal. Impression:               - Z-line regular, 35 cm from the incisors.                           - Scarring from prior banding noted, but nothing                            amenable for endoscopic therapy today                           - Edematous stomach and small bowel due to portal                            hypertension, otherwise normal exam. Moderate Sedation:      No moderate sedation, case performed with MAC Recommendation:           - Patient has a contact number available for                            emergencies. The signs and symptoms of potential                             delayed complications were discussed with the                            patient. Return to normal activities tomorrow.                            Written discharge instructions were provided to the                            patient.                           - Resume previous diet.                           - Continue present medications.                           - Repeat upper endoscopy in 6 months for                            surveillance.                           - Follow up in the clinic for office visit and                            reassessment of cirrhosis Procedure Code(s):        --- Professional ---  46568, Esophagogastroduodenoscopy, flexible,                            transoral; diagnostic, including collection of                            specimen(s) by brushing or washing, when performed                            (separate procedure) Diagnosis Code(s):        --- Professional ---                           K31.89, Other diseases of stomach and duodenum                           I85.00, Esophageal varices without bleeding CPT copyright 2019 American Medical Association. All rights reserved. The codes documented in this report are preliminary and upon coder review may  be revised to meet current compliance requirements. Remo Lipps P. Ariston Grandison, MD 01/31/2019 7:44:57 AM This report has been signed electronically. Number of Addenda: 0

## 2019-01-31 NOTE — Interval H&P Note (Signed)
History and Physical Interval Note:  01/31/2019 7:23 AM  Katie Holland  has presented today for surgery, with the diagnosis of esophageal varices.  The various methods of treatment have been discussed with the patient and family. After consideration of risks, benefits and other options for treatment, the patient has consented to  Procedure(s): ESOPHAGOGASTRODUODENOSCOPY (EGD) WITH PROPOFOL (N/A) as a surgical intervention.  The patient's history has been reviewed, patient examined, no change in status, stable for surgery.  I have reviewed the patient's chart and labs.  Questions were answered to the patient's satisfaction.     [place]

## 2019-02-01 ENCOUNTER — Other Ambulatory Visit: Payer: Self-pay | Admitting: Physician Assistant

## 2019-02-01 ENCOUNTER — Encounter (HOSPITAL_COMMUNITY): Payer: Self-pay | Admitting: Gastroenterology

## 2019-02-01 ENCOUNTER — Telehealth: Payer: Self-pay | Admitting: Physician Assistant

## 2019-02-01 NOTE — Telephone Encounter (Signed)
Patient with contrast allergy scheduled for TIPS 02/14/19 at 0800 with Dr. Vernard Gambles  Contrast allergy pre-medications (Prednisone 50 mg tab PO 13 hours, 7 hours and 1 hour prior to scheduled procedure time, Benadryl 50 mg tab PO 1 hour prior to procedure time) called in to [facility], [address], [place], [place] [address]. [phone] today at 1600 by this writer.  Candiss Norse, PA-C

## 2019-02-10 ENCOUNTER — Other Ambulatory Visit: Payer: Self-pay | Admitting: Radiology

## 2019-02-10 ENCOUNTER — Other Ambulatory Visit (HOSPITAL_COMMUNITY): Admission: RE | Admit: 2019-02-10 | Payer: Medicare Other | Source: Ambulatory Visit

## 2019-02-11 ENCOUNTER — Encounter (HOSPITAL_COMMUNITY): Payer: Self-pay | Admitting: Physician Assistant

## 2019-02-11 ENCOUNTER — Encounter (HOSPITAL_COMMUNITY): Payer: Self-pay | Admitting: *Deleted

## 2019-02-11 ENCOUNTER — Other Ambulatory Visit: Payer: Self-pay

## 2019-02-11 NOTE — Anesthesia Preprocedure Evaluation (Deleted)
Anesthesia Evaluation    Airway        Dental   Pulmonary former smoker,           Cardiovascular      Neuro/Psych    GI/Hepatic   Endo/Other    Renal/GU      Musculoskeletal   Abdominal   Peds  Hematology   Anesthesia Other Findings   Reproductive/Obstetrics                             Anesthesia Physical Anesthesia Plan  ASA:   Anesthesia Plan:    Post-op Pain Management:    Induction:   PONV Risk Score and Plan:   Airway Management Planned:   Additional Equipment:   Intra-op Plan:   Post-operative Plan:   Informed Consent:   Plan Discussed with:   Anesthesia Plan Comments: (Decompensated cirrhosis due to ethanol, complicated by recurrent large volume abdominal ascites, and a history of esophageal varices, as well as hepatic encephalopathy.  She has had multiple endoscopies and banding of large bleeding esophageal varices, most recently 08/29/2017.  On a follow-up examination 01/31/2019 no further varices were identified. Seen by IR 7 times this year for therapeutic paracentesis removing between 1.9 and 9.1 L in each setting.  Recently admitted 7/29-01/23/19 for acutely decompensated alcoholic liver cirrhosis with recurrent ascitesand moderate right pleural effusion, 6.1L removed during paracentesis. MELD score at that time was 16 per hospitalist discharge note. )        Anesthesia Quick Evaluation

## 2019-02-12 ENCOUNTER — Other Ambulatory Visit (HOSPITAL_COMMUNITY): Payer: Medicare Other

## 2019-02-14 ENCOUNTER — Ambulatory Visit (HOSPITAL_COMMUNITY)
Admission: RE | Admit: 2019-02-14 | Discharge: 2019-02-14 | Disposition: A | Discharge status: Home / Self Care | Payer: Medicare Other | Source: Ambulatory Visit | Attending: Interventional Radiology | Admitting: Interventional Radiology

## 2019-02-14 ENCOUNTER — Telehealth: Payer: Self-pay | Admitting: Radiology

## 2019-02-14 ENCOUNTER — Encounter (HOSPITAL_COMMUNITY): Payer: Self-pay

## 2019-02-14 ENCOUNTER — Other Ambulatory Visit: Payer: Self-pay

## 2019-02-14 ENCOUNTER — Encounter (HOSPITAL_COMMUNITY)
Admission: RE | Disposition: A | Discharge status: Home / Self Care | Payer: Self-pay | Source: Home / Self Care | Attending: Interventional Radiology

## 2019-02-14 ENCOUNTER — Ambulatory Visit (HOSPITAL_COMMUNITY)
Admission: RE | Admit: 2019-02-14 | Discharge: 2019-02-14 | Disposition: A | Discharge status: Home / Self Care | Payer: Medicare Other | Attending: Interventional Radiology | Admitting: Interventional Radiology

## 2019-02-14 DIAGNOSIS — K7030 Alcoholic cirrhosis of liver without ascites: Secondary | ICD-10-CM | POA: Diagnosis not present

## 2019-02-14 DIAGNOSIS — R188 Other ascites: Secondary | ICD-10-CM | POA: Diagnosis not present

## 2019-02-14 DIAGNOSIS — Z538 Procedure and treatment not carried out for other reasons: Secondary | ICD-10-CM | POA: Insufficient documentation

## 2019-02-14 DIAGNOSIS — K7031 Alcoholic cirrhosis of liver with ascites: Secondary | ICD-10-CM

## 2019-02-14 HISTORY — PX: IR PARACENTESIS: IMG2679

## 2019-02-14 LAB — BASIC METABOLIC PANEL
Anion gap: 14 (ref 5–15)
BUN: 5 mg/dL — ABNORMAL LOW (ref 6–20)
CO2: 22 mmol/L (ref 22–32)
Calcium: 8.8 mg/dL — ABNORMAL LOW (ref 8.9–10.3)
Chloride: 103 mmol/L (ref 98–111)
Creatinine, Ser: 0.77 mg/dL (ref 0.44–1.00)
GFR calc Af Amer: >60 mL/min (ref >60)
GFR calc non Af Amer: >60 mL/min (ref >60)
Glucose, Bld: 84 mg/dL (ref 70–99)
Potassium: 3.1 mmol/L — ABNORMAL LOW (ref 3.5–5.1)
Sodium: 139 mmol/L (ref 135–145)

## 2019-02-14 LAB — CBC
HCT: 33.7 % — ABNORMAL LOW (ref 36.0–46.0)
Hemoglobin: 11.2 g/dL — ABNORMAL LOW (ref 12.0–15.0)
MCH: 28.2 pg (ref 26.0–34.0)
MCHC: 33.2 g/dL (ref 30.0–36.0)
MCV: 84.9 fL (ref 80.0–100.0)
Platelets: 87 10*3/uL — ABNORMAL LOW (ref 150–400)
RBC: 3.97 MIL/uL (ref 3.87–5.11)
RDW: 22.9 % — ABNORMAL HIGH (ref 11.5–15.5)
WBC: 3.4 10*3/uL — ABNORMAL LOW (ref 4.0–10.5)
nRBC: 0 % (ref 0.0–0.2)

## 2019-02-14 LAB — PROTIME-INR
INR: 1.3 — ABNORMAL HIGH (ref 0.8–1.2)
Prothrombin Time: 16.4 seconds — ABNORMAL HIGH (ref 11.4–15.2)

## 2019-02-14 SURGERY — IR WITH ANESTHESIA
Anesthesia: General

## 2019-02-14 MED ORDER — FENTANYL CITRATE (PF) 100 MCG/2ML IJ SOLN
INTRAMUSCULAR | Status: AC
  Filled 2019-02-14: qty 2

## 2019-02-14 MED ORDER — SODIUM CHLORIDE 0.9 % IV SOLN: INTRAVENOUS | Status: DC

## 2019-02-14 MED ORDER — CEFAZOLIN SODIUM-DEXTROSE 2-4 GM/100ML-% IV SOLN
2.0000 g | INTRAVENOUS | Status: DC
  Filled 2019-02-14: qty 100

## 2019-02-14 MED ORDER — LIDOCAINE HCL 1 % IJ SOLN
INTRAMUSCULAR | Status: AC
  Filled 2019-02-14: qty 20

## 2019-02-14 MED ORDER — LIDOCAINE HCL 1 % IJ SOLN
INTRAMUSCULAR | Status: DC | PRN
  Administered 2019-02-14: 10 mL

## 2019-02-14 NOTE — Progress Notes (Signed)
Case has been cancelled. Pt did not make it to her COVID test appointment scheduled last Friday. Pt is also allergic to contrast dye.She did not take her pre-medication for the procedure.  After discharge, pt will go to Xray for possible paracentesis procedure.

## 2019-02-14 NOTE — Procedures (Signed)
PROCEDURE SUMMARY:  Successful image-guided paracentesis from the right lateral abdomen.  Yielded 5.6 liters of clear yellow fluid.  No immediate complications.  EBL < 5 mL. Patient tolerated well.   Specimen was not sent for labs.  Claris Pong Lasheika Ortloff PA-C 02/14/2019 8:38 AM

## 2019-02-14 NOTE — Progress Notes (Addendum)
Patient ID: Katie Holland, female   DOB: 16-Aug-1965, 53 y.o.   MRN: [id number]   ETOH Cirrhosis LV Ascites Hx Esophageal varices Multiple paracentesis-- last one 8/1: 6.1 L  Has seen Dr Vernard Gambles in consultation 12/22/18 for TIPS procedure My impression is that this patient has hepatic cirrhosis and portal venous hypertension resulting in recurrent large volume symptomatic abdominal ascites.  Possible portal vein thrombosis identified on recent ultrasound   MELD 12 at that time  Was scheduled for TIPS today  Pt did not keep appt for Covid testing this last Friday  She also did not fill meds for premedication for contrast allergy  Anesthesia is cancelling procedure secondary noncompliance for covid test Can reschedule testing when TIPS rescheduled   IR offering pt paracentesis while she is here today Pt is full of ascites and with abd pain Would like para while here  She will be discharged from [facility]/Pre OP And come to Radiology for para today Approved with Dr Vernard Gambles.

## 2019-02-14 NOTE — Progress Notes (Signed)
   [place] RN asks me to call pts daughter I spoke with pts Dtr Kathrin Greathouse [phone]  Discussed with her about her mother's noncompliance with Covid testing and premedication for procedure. All causing cancellation of procedure TIPS today.  Dtr states her mother has been more confused over last few weeks She tries to be Care Giver as much as possible. Most Dr Offices go through her to scheduled her mothers appointments and such. She wasn't really aware of TIPS--- thought todays visit was an appt for paracentesis.  Asking [place] to reschedule TIPS and call her with appt date and time She will be sure all is lined up appropriately and will help mother keep appt and pre op needs and premedication needs.  I have called IR scheduler She now has Dtr phone # -- for all appts and requirements.

## 2019-02-16 ENCOUNTER — Telehealth: Payer: Self-pay | Admitting: Gastroenterology

## 2019-02-17 NOTE — Telephone Encounter (Signed)
Patient had EGD at [facility] on 01/31/19. 6 month recall.  Left message for patient to call back

## 2019-02-21 NOTE — Telephone Encounter (Signed)
Called daughter back and left message on voice mail that her Mother had an EGD on 01/31/19 at [facility] and does not need one again for 6 months (per Dr.Armbruster's note). Asked her to call back if she is referring to another procedure or if she has any further questions.

## 2019-03-02 DIAGNOSIS — K7290 Hepatic failure, unspecified without coma: Secondary | ICD-10-CM | POA: Diagnosis not present

## 2019-03-02 DIAGNOSIS — R188 Other ascites: Secondary | ICD-10-CM | POA: Diagnosis not present

## 2019-03-02 DIAGNOSIS — I8500 Esophageal varices without bleeding: Secondary | ICD-10-CM | POA: Diagnosis not present

## 2019-03-05 ENCOUNTER — Ambulatory Visit (HOSPITAL_COMMUNITY)
Admission: RE | Admit: 2019-03-05 | Discharge: 2019-03-05 | Disposition: A | Discharge status: Home / Self Care | Payer: Medicare Other | Source: Ambulatory Visit | Attending: Interventional Radiology | Admitting: Interventional Radiology

## 2019-03-05 DIAGNOSIS — K7030 Alcoholic cirrhosis of liver without ascites: Secondary | ICD-10-CM | POA: Insufficient documentation

## 2019-03-05 DIAGNOSIS — Z20828 Contact with and (suspected) exposure to other viral communicable diseases: Secondary | ICD-10-CM | POA: Insufficient documentation

## 2019-03-05 DIAGNOSIS — Z01812 Encounter for preprocedural laboratory examination: Secondary | ICD-10-CM | POA: Insufficient documentation

## 2019-03-06 LAB — NOVEL CORONAVIRUS, NAA (HOSP ORDER, SEND-OUT TO REF LAB; TAT 18-24 HRS): SARS-CoV-2, NAA: NOT DETECTED

## 2019-03-08 ENCOUNTER — Ambulatory Visit (HOSPITAL_COMMUNITY): Payer: Medicare Other | Admitting: Certified Registered Nurse Anesthetist

## 2019-03-08 ENCOUNTER — Telehealth: Payer: Self-pay | Admitting: Radiology

## 2019-03-08 ENCOUNTER — Encounter (HOSPITAL_COMMUNITY): Payer: Self-pay | Admitting: *Deleted

## 2019-03-08 ENCOUNTER — Other Ambulatory Visit: Payer: Self-pay | Admitting: Radiology

## 2019-03-08 ENCOUNTER — Other Ambulatory Visit: Payer: Self-pay

## 2019-03-08 NOTE — Progress Notes (Signed)
Spoke with pt's daughter, Kathrin Greathouse for pre-op call. DPR on file. She states pt has not had any cardiac issues, never treated for HTN or Diabetes. Pt does have cirrhosis. Pt has an allergy to contrast dye so Jannifer Franklin, PA has called in prescriptions for Prednisone and Benadryl to take starting this evening at 7 PM and will take at 1 AM and then again at 7 AM. [name] is aware of this and will be picking it up from the pharmacy after work.  Pt had Covid test done on 03/05/19 and it is negative. [name] states her mother has been in quarantine since the test was done. [name] understands the visitation policy and will drop pt off in the AM and she will come back around 1 PM and visit pt once she is admitted.

## 2019-03-08 NOTE — Progress Notes (Signed)
   Talked with Dtr Kathrin Greathouse She has not picked up pre meds "Pharmacy has no Rx for mother"  Called Pred 50 mg #3 and Benadryl 50 mg #1 To [place] aware She will pick up after work today

## 2019-03-08 NOTE — Anesthesia Preprocedure Evaluation (Addendum)
Anesthesia Evaluation  Patient identified by MRN, date of birth, ID band Patient awake    Reviewed: Allergy & Precautions, NPO status , Patient's Chart, lab work & pertinent test results  Airway Mallampati: II  TM Distance: >3 FB     Dental   Pulmonary Current Smoker and Patient abstained from smoking.,    breath sounds clear to auscultation       Cardiovascular  Rhythm:Regular Rate:Normal     Neuro/Psych  Headaches, PSYCHIATRIC DISORDERS    GI/Hepatic GERD  ,History noted CG   Endo/Other    Renal/GU negative Renal ROS     Musculoskeletal   Abdominal   Peds  Hematology   Anesthesia Other Findings   Reproductive/Obstetrics                            Anesthesia Physical Anesthesia Plan  ASA: III  Anesthesia Plan: General   Post-op Pain Management:    Induction: Intravenous  PONV Risk Score and Plan: 2 and Ondansetron and Dexamethasone  Airway Management Planned: Oral ETT  Additional Equipment: Arterial line  Intra-op Plan:   Post-operative Plan: Possible Post-op intubation/ventilation  Informed Consent:     Dental advisory given  Plan Discussed with: Anesthesiologist, CRNA and Surgeon  Anesthesia Plan Comments:        Anesthesia Quick Evaluation

## 2019-03-09 ENCOUNTER — Encounter (HOSPITAL_COMMUNITY)
Admission: RE | Disposition: A | Discharge status: Home / Self Care | Payer: Self-pay | Source: Home / Self Care | Attending: Interventional Radiology

## 2019-03-09 ENCOUNTER — Ambulatory Visit (HOSPITAL_COMMUNITY)
Admission: RE | Admit: 2019-03-09 | Discharge: 2019-03-09 | Disposition: A | Discharge status: Home / Self Care | Payer: Medicare Other | Attending: Internal Medicine | Admitting: Internal Medicine

## 2019-03-09 ENCOUNTER — Ambulatory Visit (HOSPITAL_COMMUNITY): Payer: Medicare Other

## 2019-03-09 ENCOUNTER — Inpatient Hospital Stay (HOSPITAL_COMMUNITY)
Admission: RE | Admit: 2019-03-09 | Discharge: 2019-03-09 | Disposition: A | Discharge status: Home / Self Care | Payer: Medicare Other | Source: Ambulatory Visit | Attending: Interventional Radiology | Admitting: Interventional Radiology

## 2019-03-09 ENCOUNTER — Encounter (HOSPITAL_COMMUNITY): Payer: Self-pay

## 2019-03-09 DIAGNOSIS — K7031 Alcoholic cirrhosis of liver with ascites: Secondary | ICD-10-CM | POA: Diagnosis not present

## 2019-03-09 DIAGNOSIS — F17210 Nicotine dependence, cigarettes, uncomplicated: Secondary | ICD-10-CM | POA: Diagnosis not present

## 2019-03-09 DIAGNOSIS — K219 Gastro-esophageal reflux disease without esophagitis: Secondary | ICD-10-CM | POA: Insufficient documentation

## 2019-03-09 DIAGNOSIS — Z79899 Other long term (current) drug therapy: Secondary | ICD-10-CM | POA: Diagnosis not present

## 2019-03-09 DIAGNOSIS — Z882 Allergy status to sulfonamides status: Secondary | ICD-10-CM | POA: Diagnosis not present

## 2019-03-09 DIAGNOSIS — Z91041 Radiographic dye allergy status: Secondary | ICD-10-CM | POA: Diagnosis not present

## 2019-03-09 DIAGNOSIS — Z91040 Latex allergy status: Secondary | ICD-10-CM | POA: Diagnosis not present

## 2019-03-09 HISTORY — PX: IR PARACENTESIS: IMG2679

## 2019-03-09 HISTORY — DX: Headache, unspecified: R51.9

## 2019-03-09 LAB — CBC
HCT: 34.6 % — ABNORMAL LOW (ref 36.0–46.0)
Hemoglobin: 12 g/dL (ref 12.0–15.0)
MCH: 30 pg (ref 26.0–34.0)
MCHC: 34.7 g/dL (ref 30.0–36.0)
MCV: 86.5 fL (ref 80.0–100.0)
Platelets: 67 10*3/uL — ABNORMAL LOW (ref 150–400)
RBC: 4 MIL/uL (ref 3.87–5.11)
RDW: 19.9 % — ABNORMAL HIGH (ref 11.5–15.5)
WBC: 1.8 10*3/uL — ABNORMAL LOW (ref 4.0–10.5)
nRBC: 0 % (ref 0.0–0.2)

## 2019-03-09 LAB — PROTIME-INR
INR: 1.6 — ABNORMAL HIGH (ref 0.8–1.2)
Prothrombin Time: 18.7 seconds — ABNORMAL HIGH (ref 11.4–15.2)

## 2019-03-09 LAB — COMPREHENSIVE METABOLIC PANEL
ALT: 23 U/L (ref 0–44)
AST: 66 U/L — ABNORMAL HIGH (ref 15–41)
Albumin: 2.6 g/dL — ABNORMAL LOW (ref 3.5–5.0)
Alkaline Phosphatase: 216 U/L — ABNORMAL HIGH (ref 38–126)
Anion gap: 13 (ref 5–15)
BUN: 7 mg/dL (ref 6–20)
CO2: 19 mmol/L — ABNORMAL LOW (ref 22–32)
Calcium: 9 mg/dL (ref 8.9–10.3)
Chloride: 101 mmol/L (ref 98–111)
Creatinine, Ser: 0.81 mg/dL (ref 0.44–1.00)
GFR calc Af Amer: >60 mL/min (ref >60)
GFR calc non Af Amer: >60 mL/min (ref >60)
Glucose, Bld: 183 mg/dL — ABNORMAL HIGH (ref 70–99)
Potassium: 3.8 mmol/L (ref 3.5–5.1)
Sodium: 133 mmol/L — ABNORMAL LOW (ref 135–145)
Total Bilirubin: 3.9 mg/dL — ABNORMAL HIGH (ref 0.3–1.2)
Total Protein: 7.4 g/dL (ref 6.5–8.1)

## 2019-03-09 LAB — PREPARE RBC (CROSSMATCH)

## 2019-03-09 SURGERY — IR WITH ANESTHESIA
Anesthesia: General

## 2019-03-09 MED ORDER — SODIUM CHLORIDE 0.9 % IV SOLN: INTRAVENOUS | Status: DC

## 2019-03-09 MED ORDER — LIDOCAINE HCL 1 % IJ SOLN
INTRAMUSCULAR | Status: DC | PRN
  Administered 2019-03-09: 10 mL

## 2019-03-09 MED ORDER — LACTATED RINGERS IV SOLN
INTRAVENOUS | Status: AC | PRN
  Administered 2019-03-09: 08:00:00 via INTRAVENOUS

## 2019-03-09 MED ORDER — SODIUM CHLORIDE 0.9% IV SOLUTION: Freq: Once | INTRAVENOUS | Status: DC

## 2019-03-09 MED ORDER — LIDOCAINE HCL 1 % IJ SOLN
INTRAMUSCULAR | Status: AC
  Filled 2019-03-09: qty 20

## 2019-03-09 MED ORDER — CEFAZOLIN SODIUM-DEXTROSE 2-4 GM/100ML-% IV SOLN
2.0000 g | INTRAVENOUS | Status: DC
  Filled 2019-03-09: qty 100

## 2019-03-09 NOTE — Anesthesia Procedure Notes (Signed)
Arterial Line Insertion Start/End9/16/2020 7:40 AM, 03/09/2019 7:50 AM Performed by: Leonor Liv, CRNA, CRNA  Patient location: Pre-op. Preanesthetic checklist: patient identified, IV checked, site marked, risks and benefits discussed, surgical consent, monitors and equipment checked, pre-op evaluation, timeout performed and anesthesia consent Lidocaine 1% used for infiltration Left, radial was placed Catheter size: 20 G Hand hygiene performed , maximum sterile barriers used  and Seldinger technique used Allen's test indicative of satisfactory collateral circulation Attempts: 1 Procedure performed without using ultrasound guided technique. Following insertion, dressing applied and Biopatch. Post procedure assessment: normal  Patient tolerated the procedure well with no immediate complications.

## 2019-03-09 NOTE — H&P (Deleted)
  The note originally documented on this encounter has been moved the the encounter in which it belongs.  

## 2019-03-09 NOTE — Progress Notes (Signed)
Patient states that she took 50 mg of benadryl at 0600 day of surgery

## 2019-03-09 NOTE — H&P (Signed)
Chief Complaint: Patient was seen in consultation today for TIPS procedure at the request of Hassell,Daniel  Referring Physician(s): Hassell,Daniel  Supervising Physician: Arne Cleveland  Patient Status: [facility] - Out-pt  History of Present Illness: Katie Holland is a 53 y.o. female with refractory ascites secondary to alcoholic cirrhosis. She was seen in consult on 7/31 by Dr. Vernard Gambles and is now scheduled for TIPS procedure. She has hx of contrast allergy so pre-medication regimen was prescribed and taken by the pt as directed. She feels well this am. C/o abd distention but no pain. PMHx, meds, labs, imaging, allergies reviewed. Feels well otherwise, no recent fevers, chills, illness. Has been NPO today as directed.    Past Medical History:  Diagnosis Date   Alcoholic liver disease (HCC)    Chronic back pain    on disability   Cirrhosis of liver (HCC)    Esophageal varices (HCC)    GERD (gastroesophageal reflux disease)    Headache    Jaundice     Past Surgical History:  Procedure Laterality Date   BACK SURGERY     L3-L4   ESOPHAGEAL BANDING  04/25/2018   Procedure: ESOPHAGEAL BANDING;  Surgeon: Yetta Flock, MD;  Location: [facility];  Service: Gastroenterology;;   ESOPHAGEAL BANDING  04/27/2018   Procedure: ESOPHAGEAL BANDING;  Surgeon: Milus Banister, MD;  Location: [place];  Service: Endoscopy;;   ESOPHAGEAL BANDING  06/02/2018   Procedure: ESOPHAGEAL BANDING;  Surgeon: Doran Stabler, MD;  Location: [place];  Service: Gastroenterology;;   ESOPHAGEAL BANDING N/A 07/07/2018   Procedure: ESOPHAGEAL BANDING;  Surgeon: Yetta Flock, MD;  Location: [facility];  Service: Gastroenterology;  Laterality: N/A;   ESOPHAGEAL BANDING  07/27/2018   Procedure: ESOPHAGEAL BANDING;  Surgeon: Yetta Flock, MD;  Location: [facility];  Service: Gastroenterology;;   ESOPHAGEAL BANDING  08/30/2018   Procedure: ESOPHAGEAL BANDING;   Surgeon: Yetta Flock, MD;  Location: [facility];  Service: Gastroenterology;;   ESOPHAGOGASTRODUODENOSCOPY N/A 04/27/2018   Procedure: ESOPHAGOGASTRODUODENOSCOPY (EGD);  Surgeon: Milus Banister, MD;  Location: [facility];  Service: Endoscopy;  Laterality: N/A;   ESOPHAGOGASTRODUODENOSCOPY (EGD) WITH PROPOFOL N/A 04/25/2018   Procedure: ESOPHAGOGASTRODUODENOSCOPY (EGD) WITH PROPOFOL;  Surgeon: Yetta Flock, MD;  Location: [place];  Service: Gastroenterology;  Laterality: N/A;   ESOPHAGOGASTRODUODENOSCOPY (EGD) WITH PROPOFOL N/A 06/02/2018   Procedure: ESOPHAGOGASTRODUODENOSCOPY (EGD) WITH PROPOFOL;  Surgeon: Doran Stabler, MD;  Location: [place];  Service: Gastroenterology;  Laterality: N/A;   ESOPHAGOGASTRODUODENOSCOPY (EGD) WITH PROPOFOL N/A 07/07/2018   Procedure: ESOPHAGOGASTRODUODENOSCOPY (EGD) WITH PROPOFOL;  Surgeon: Yetta Flock, MD;  Location: [facility];  Service: Gastroenterology;  Laterality: N/A;   ESOPHAGOGASTRODUODENOSCOPY (EGD) WITH PROPOFOL N/A 07/27/2018   Procedure: ESOPHAGOGASTRODUODENOSCOPY (EGD) WITH PROPOFOL;  Surgeon: Yetta Flock, MD;  Location: [facility];  Service: Gastroenterology;  Laterality: N/A;   ESOPHAGOGASTRODUODENOSCOPY (EGD) WITH PROPOFOL N/A 08/30/2018   Procedure: ESOPHAGOGASTRODUODENOSCOPY (EGD) WITH PROPOFOL;  Surgeon: Yetta Flock, MD;  Location: [facility];  Service: Gastroenterology;  Laterality: N/A;   ESOPHAGOGASTRODUODENOSCOPY (EGD) WITH PROPOFOL N/A 11/08/2018   Procedure: ESOPHAGOGASTRODUODENOSCOPY (EGD) WITH PROPOFOL;  Surgeon: Yetta Flock, MD;  Location: [facility];  Service: Gastroenterology;  Laterality: N/A;   ESOPHAGOGASTRODUODENOSCOPY (EGD) WITH PROPOFOL N/A 01/31/2019   Procedure: ESOPHAGOGASTRODUODENOSCOPY (EGD) WITH PROPOFOL;  Surgeon: Yetta Flock, MD;  Location: [facility];  Service: Gastroenterology;  Laterality: N/A;   IR PARACENTESIS  04/26/2018    IR PARACENTESIS  05/18/2018   IR PARACENTESIS  05/31/2018   IR PARACENTESIS  06/30/2018   IR PARACENTESIS  08/05/2018   IR PARACENTESIS  08/31/2018   IR PARACENTESIS  10/08/2018   IR PARACENTESIS  10/28/2018   IR PARACENTESIS  02/14/2019   IR RADIOLOGIST EVAL & MGMT  12/23/2018   IR THORACENTESIS ASP PLEURAL SPACE W/IMG GUIDE  12/10/2018   IR THORACENTESIS ASP PLEURAL SPACE W/IMG GUIDE  01/21/2019    Allergies: Contrast media [iodinated diagnostic agents], Latex, and Sulfa antibiotics  Medications: Prior to Admission medications   Medication Sig Start Date End Date Taking? Authorizing Provider  acetaminophen (TYLENOL) 500 MG tablet Take 1,000 mg by mouth 2 (two) times daily as needed for mild pain or headache.     [provider]  folic acid (FOLVITE) 1 MG tablet Take 1 mg by mouth daily.    [provider]  furosemide (LASIX) 20 MG tablet Take 2 tablets (40 mg total) by mouth 2 (two) times daily. 01/23/19   Shahmehdi, Valeria Batman, MD  lactulose (CHRONULAC) 10 GM/15ML solution Take 45 mLs (30 g total) by mouth 3 (three) times daily. Patient taking differently: Take 20 g by mouth 3 (three) times daily.  01/23/19   Deatra James, MD  Multiple Vitamin (MULTIVITAMIN WITH MINERALS) TABS tablet Take 1 tablet by mouth daily.    [provider]  nicotine (NICODERM CQ - DOSED IN MG/24 HOURS) 14 mg/24hr patch Place 1 patch (14 mg total) onto the skin daily. Patient taking differently: Place 14 mg onto the skin daily as needed (smoking cessation).  01/23/19   Shahmehdi, Valeria Batman, MD  potassium chloride (K-DUR) 10 MEQ tablet Take 2 tablets (20 mEq total) by mouth daily. 01/23/19   Shahmehdi, Valeria Batman, MD  rifaximin (XIFAXAN) 550 MG TABS tablet Take 1 tablet (550 mg total) by mouth 2 (two) times daily. 01/23/19   Shahmehdi, Valeria Batman, MD  spironolactone (ALDACTONE) 100 MG tablet Take 2 tablets (200 mg total) by mouth daily. 01/23/19   Deatra James, MD     Family History  Problem  Relation Age of Onset   Colon cancer Neg Hx    Esophageal cancer Neg Hx     Social History   Socioeconomic History   Marital status: Single    Spouse name: Not on file   Number of children: 8   Years of education: Not on file   Highest education level: Not on file  Occupational History   Occupation: disability  Social Needs   Financial resource strain: Not on file   Food insecurity    Worry: Not on file    Inability: Not on file   Transportation needs    Medical: Not on file    Non-medical: Not on file  Tobacco Use   Smoking status: Current Every Day Smoker    Packs/day: 1.00    Years: 20.00    Pack years: 20.00    Types: Cigarettes   Smokeless tobacco: Never Used  Substance and Sexual Activity   Alcohol use: Not Currently    Comment: denies h/o withdrawal, quit 05/23/2018   Drug use: Not Currently   Sexual activity: Not on file  Lifestyle   Physical activity    Days per week: Not on file    Minutes per session: Not on file   Stress: Not on file  Relationships   Social connections    Talks on phone: Not on file    Gets together: Not on file    Attends religious service: Not  on file    Active member of club or organization: Not on file    Attends meetings of clubs or organizations: Not on file    Relationship status: Not on file  Other Topics Concern   Not on file  Social History Narrative   Not on file     Review of Systems: A 12 point ROS discussed and pertinent positives are indicated in the HPI above.  All other systems are negative.  Review of Systems  Vital Signs: T: 98.3, HR: 103, BP: 148/93  RR: 18  Physical Exam Constitutional:      Appearance: Normal appearance. She is not ill-appearing or toxic-appearing.  HENT:     Mouth/Throat:     Mouth: Mucous membranes are moist.     Pharynx: Oropharynx is clear.  Cardiovascular:     Rate and Rhythm: Normal rate and regular rhythm.     Heart sounds: Normal heart sounds.    Pulmonary:     Effort: Pulmonary effort is normal. No respiratory distress.     Breath sounds: Normal breath sounds.  Abdominal:     General: There is distension.     Palpations: Abdomen is soft. There is no mass.     Tenderness: There is no abdominal tenderness.  Skin:    General: Skin is warm and dry.     Coloration: Skin is not jaundiced.  Neurological:     General: No focal deficit present.     Mental Status: She is alert and oriented to person, place, and time.  Psychiatric:        Mood and Affect: Mood normal.        Judgment: Judgment normal.      Labs:  CBC: Recent Labs    01/21/19 0626 01/22/19 0528 01/23/19 0717 02/14/19 0659  WBC 4.2 4.1 3.0* 3.4*  HGB 10.4* 10.9* 9.5* 11.2*  HCT 30.4* 31.8* 27.9* 33.7*  PLT 68* 66* 55* 87*    COAGS: Recent Labs    06/04/18 0319  08/05/18 1022 11/04/18 0934 01/20/19 1407 02/14/19 0659  INR 1.47   < > 1.45 1.3* 1.6* 1.3*  APTT 27  --   --   --   --   --    < > = values in this interval not displayed.    BMP: Recent Labs    01/21/19 0626 01/22/19 0528 01/23/19 0717 02/14/19 0659  NA 138 138 138 139  K 3.4* 3.2* 3.1* 3.1*  CL 105 105 106 103  CO2 25 23 25 22   GLUCOSE 110* 102* 114* 84  BUN 7 <5* <5* 5*  CALCIUM 8.4* 8.1* 8.3* 8.8*  CREATININE 0.79 0.68 0.59 0.77  GFRNONAA >60 >60 >60 >60  GFRAA >60 >60 >60 >60    LIVER FUNCTION TESTS: Recent Labs    01/20/19 0246 01/21/19 0626 01/22/19 0528 01/23/19 0717  BILITOT 4.3* 3.1* 2.7* 2.5*  AST 59* 50* 50* 31  ALT 22 19 20 12   ALKPHOS 180* 171* 165* 109  PROT 6.2* 5.9* 5.9* 5.2*  ALBUMIN 2.3* 2.1* 2.0* 2.7*    TUMOR MARKERS: Recent Labs    05/27/18 1514 11/04/18 0934  AFPTM 2.7 2.1    Assessment and Plan: Refractory ascites secondary to EtOH cirrhosis. Plan for TIPS procedure with likely paracentesis. Labs pending Risks and benefits of TIPS, paracentesis were discussed with the patient and/or the patient's family including, but not  limited to, infection, bleeding, damage to adjacent structures, worsening hepatic and/or cardiac function, and death.  This interventional procedure involves the use of X-rays and because of the nature of the planned procedure, it is possible that we will have prolonged use of X-ray fluoroscopy.  Potential radiation risks to you include (but are not limited to) the following: - A slightly elevated risk for cancer  several years later in life. This risk is typically less than 0.5% percent. This risk is low in comparison to the normal incidence of human cancer, which is 33% for women and 50% for men according to the [employer]. - Radiation induced injury can include skin redness, resembling a rash, tissue breakdown / ulcers and hair loss (which can be temporary or permanent).   The likelihood of either of these occurring depends on the difficulty of the procedure and whether you are sensitive to radiation due to previous procedures, disease, or genetic conditions.   IF your procedure requires a prolonged use of radiation, you will be notified and given written instructions for further action.  It is your responsibility to monitor the irradiated area for the 2 weeks following the procedure and to notify your physician if you are concerned that you have suffered a radiation induced injury.    All of the patient's questions were answered, patient is agreeable to proceed.  Consent signed and in chart.     Thank you for this interesting consult.  I greatly enjoyed meeting Katie Holland and look forward to participating in their care.  A copy of this report was sent to the requesting provider on this date.  Electronically Signed: Ascencion Dike, PA-C 03/09/2019, 7:44 AM   I spent a total of 25 minutes in face to face in clinical consultation, greater than 50% of which was counseling/coordinating care for TIPS

## 2019-03-09 NOTE — Progress Notes (Signed)
Informed by Lennette Bihari, PA that patient's labs came back abnormal and they were wanting to admit the patient to have them corrected with the plan to put her on the schedule in a few days.  Lennette Bihari and Dr. Vernard Gambles spoke with patient.  Anesthesia made aware.  Awaiting order for admission and will place bed request.

## 2019-03-09 NOTE — Progress Notes (Signed)
Katie Holland [id number] DOB: 1965/10/22 DOA: 03/09/2019  PCP: Billie Ruddy, MD  HPI: Katie Holland is a 53 y.o. female with medical history significant of alcoholic cirrhosis presenting for outpatient TIPS procedure.   IR Course:  Scheduled for TIPS.  Labs are "out of whack."  MELD 21.  Needs tune up, possible TIPS Friday or Saturday.  Maybe hydration would improve her labs.     Past Medical History:  Diagnosis Date  . Alcoholic liver disease ([place])   . Chronic back pain    on disability  . Cirrhosis of liver ([place])   . Esophageal varices ([place])   . GERD (gastroesophageal reflux disease)   . Headache   . Jaundice     Past Surgical History:  Procedure Laterality Date  . BACK SURGERY     L3-L4  . ESOPHAGEAL BANDING  04/25/2018   Procedure: ESOPHAGEAL BANDING;  Surgeon: Yetta Flock, MD;  Location: [facility] ENDOSCOPY;  Service: Gastroenterology;;  . ESOPHAGEAL BANDING  04/27/2018   Procedure: ESOPHAGEAL BANDING;  Surgeon: Milus Banister, MD;  Location: [place];  Service: Endoscopy;;  . ESOPHAGEAL BANDING  06/02/2018   Procedure: ESOPHAGEAL BANDING;  Surgeon: Doran Stabler, MD;  Location: [place];  Service: Gastroenterology;;  . ESOPHAGEAL BANDING N/A 07/07/2018   Procedure: ESOPHAGEAL BANDING;  Surgeon: Yetta Flock, MD;  Location: [facility] ENDOSCOPY;  Service: Gastroenterology;  Laterality: N/A;  . ESOPHAGEAL BANDING  07/27/2018   Procedure: ESOPHAGEAL BANDING;  Surgeon: Yetta Flock, MD;  Location: [facility] ENDOSCOPY;  Service: Gastroenterology;;  . ESOPHAGEAL BANDING  08/30/2018   Procedure: ESOPHAGEAL BANDING;  Surgeon: Yetta Flock, MD;  Location: [facility] ENDOSCOPY;  Service: Gastroenterology;;  . ESOPHAGOGASTRODUODENOSCOPY N/A 04/27/2018   Procedure: ESOPHAGOGASTRODUODENOSCOPY (EGD);  Surgeon: Milus Banister, MD;  Location: [facility] ENDOSCOPY;  Service: Endoscopy;  Laterality: N/A;  . ESOPHAGOGASTRODUODENOSCOPY (EGD) WITH PROPOFOL N/A 04/25/2018   Procedure: ESOPHAGOGASTRODUODENOSCOPY (EGD) WITH PROPOFOL;  Surgeon: Yetta Flock, MD;  Location: [place];  Service: Gastroenterology;  Laterality: N/A;  . ESOPHAGOGASTRODUODENOSCOPY (EGD) WITH PROPOFOL N/A 06/02/2018   Procedure: ESOPHAGOGASTRODUODENOSCOPY (EGD) WITH PROPOFOL;  Surgeon: Doran Stabler, MD;  Location: [place];  Service: Gastroenterology;  Laterality: N/A;  . ESOPHAGOGASTRODUODENOSCOPY (EGD) WITH PROPOFOL N/A 07/07/2018   Procedure: ESOPHAGOGASTRODUODENOSCOPY (EGD) WITH PROPOFOL;  Surgeon: Yetta Flock, MD;  Location: [facility] ENDOSCOPY;  Service: Gastroenterology;  Laterality: N/A;  . ESOPHAGOGASTRODUODENOSCOPY (EGD) WITH PROPOFOL N/A 07/27/2018   Procedure: ESOPHAGOGASTRODUODENOSCOPY (EGD) WITH PROPOFOL;  Surgeon: Yetta Flock, MD;  Location: [facility] ENDOSCOPY;  Service: Gastroenterology;  Laterality: N/A;  . ESOPHAGOGASTRODUODENOSCOPY (EGD) WITH PROPOFOL N/A 08/30/2018   Procedure: ESOPHAGOGASTRODUODENOSCOPY (EGD) WITH PROPOFOL;  Surgeon: Yetta Flock, MD;  Location: [facility] ENDOSCOPY;  Service: Gastroenterology;  Laterality: N/A;  . ESOPHAGOGASTRODUODENOSCOPY (EGD) WITH PROPOFOL N/A 11/08/2018   Procedure: ESOPHAGOGASTRODUODENOSCOPY (EGD) WITH PROPOFOL;  Surgeon: Yetta Flock, MD;  Location: [facility] ENDOSCOPY;  Service: Gastroenterology;  Laterality: N/A;  . ESOPHAGOGASTRODUODENOSCOPY (EGD) WITH PROPOFOL N/A 01/31/2019   Procedure: ESOPHAGOGASTRODUODENOSCOPY (EGD) WITH PROPOFOL;  Surgeon: Yetta Flock, MD;  Location: [facility] ENDOSCOPY;  Service: Gastroenterology;  Laterality: N/A;  . IR PARACENTESIS  04/26/2018  . IR PARACENTESIS  05/18/2018  . IR PARACENTESIS  05/31/2018  . IR PARACENTESIS  06/30/2018  . IR PARACENTESIS  08/05/2018  . IR PARACENTESIS  08/31/2018  . IR PARACENTESIS  10/08/2018  . IR PARACENTESIS  10/28/2018  . IR PARACENTESIS  02/14/2019  . IR RADIOLOGIST EVAL & MGMT  12/23/2018  .  IR THORACENTESIS ASP PLEURAL SPACE W/IMG GUIDE  12/10/2018   . IR THORACENTESIS ASP PLEURAL SPACE W/IMG GUIDE  01/21/2019    Social History   Socioeconomic History  . Marital status: Single    Spouse name: Not on file  . Number of children: 8  . Years of education: Not on file  . Highest education level: Not on file  Occupational History  . Occupation: disability  Social Needs  . Financial resource strain: Not on file  . Food insecurity    Worry: Not on file    Inability: Not on file  . Transportation needs    Medical: Not on file    Non-medical: Not on file  Tobacco Use  . Smoking status: Current Every Day Smoker    Packs/day: 1.00    Years: 20.00    Pack years: 20.00    Types: Cigarettes  . Smokeless tobacco: Never Used  Substance and Sexual Activity  . Alcohol use: Not Currently    Comment: denies h/o withdrawal, quit 05/23/2018  . Drug use: Not Currently  . Sexual activity: Not on file  Lifestyle  . Physical activity    Days per week: Not on file    Minutes per session: Not on file  . Stress: Not on file  Relationships  . Social Herbalist on phone: Not on file    Gets together: Not on file    Attends religious service: Not on file    Active member of club or organization: Not on file    Attends meetings of clubs or organizations: Not on file    Relationship status: Not on file  . Intimate partner violence    Fear of current or ex partner: Not on file    Emotionally abused: Not on file    Physically abused: Not on file    Forced sexual activity: Not on file  Other Topics Concern  . Not on file  Social History Narrative  . Not on file    Allergies  Allergen Reactions  . Contrast Media [Iodinated Diagnostic Agents] Itching and Swelling    Needs 13-hr prep  . Latex Itching, Swelling and Other (See Comments)    No breathing impairment, however  . Sulfa Antibiotics Itching, Swelling and Other (See Comments)    No breathing impairment, however    Family History  Problem Relation Age of Onset  . Colon  cancer Neg Hx   . Esophageal cancer Neg Hx     Prior to Admission medications   Medication Sig Start Date End Date Taking? Authorizing Provider  acetaminophen (TYLENOL) 500 MG tablet Take 1,000 mg by mouth 2 (two) times daily as needed for mild pain or headache.    Yes [provider]  folic acid (FOLVITE) 1 MG tablet Take 1 mg by mouth daily.   Yes [provider]  furosemide (LASIX) 20 MG tablet Take 2 tablets (40 mg total) by mouth 2 (two) times daily. 01/23/19  Yes Shahmehdi, Seyed A, MD  lactulose (CHRONULAC) 10 GM/15ML solution Take 45 mLs (30 g total) by mouth 3 (three) times daily. Patient taking differently: Take 20 g by mouth 3 (three) times daily.  01/23/19  Yes Shahmehdi, Valeria Batman, MD  Multiple Vitamin (MULTIVITAMIN WITH MINERALS) TABS tablet Take 1 tablet by mouth daily.   Yes [provider]  nicotine (NICODERM CQ - DOSED IN MG/24 HOURS) 14 mg/24hr patch Place 1 patch (14 mg total) onto the skin daily. Patient taking differently:  Place 14 mg onto the skin daily as needed (smoking cessation).  01/23/19  Yes Shahmehdi, Seyed A, MD  potassium chloride (K-DUR) 10 MEQ tablet Take 2 tablets (20 mEq total) by mouth daily. 01/23/19  Yes Shahmehdi, Valeria Batman, MD  rifaximin (XIFAXAN) 550 MG TABS tablet Take 1 tablet (550 mg total) by mouth 2 (two) times daily. 01/23/19  Yes Shahmehdi, Seyed A, MD  spironolactone (ALDACTONE) 100 MG tablet Take 2 tablets (200 mg total) by mouth daily. 01/23/19  Yes Deatra James, MD    Physical Exam: Vitals:   03/09/19 0652 03/09/19 0734  BP: (!) 168/103 (!) 148/93  Pulse: (!) 103   Resp: 18   Temp: 98.3 F (36.8 C)   TempSrc: Oral   SpO2: 98%   Weight: 59.9 kg   Height: 5\' 4"  (1.626 m)      Radiological Exams on Admission: No results found.  Labs on Presentation: I have personally reviewed the available labs and imaging studies at the time of the admission.  Pertinent labs:   Na++ 133 CO2 19 Glucose 183 AP 216 Albumin  2.6 AST 66/ALT 23/Bili 3.9 WBC 1.8; 3.4 on 8/24 Platelets 67; 87 on 8/24 INR 1.6; 1.3 on 8/24   Assessment/Plan Active Problems:   Alcoholic cirrhosis of liver with ascites ([place])   Patient was discussed with both PA Bruning and Dr. Vernard Gambles.  She has advanced liver disease and would likely benefit from TIPS, but her labs give her a MELD score that is too high to allow procedure at this time.  However, her labs are stable from prior and so there is likely little that will be accomplished from keeping her in the ER for several days in anticipation of the procedure on Saturday.  As such, I have encouraged ongoing outpatient evaluation and management with plan for outpatient procedure if possible.  She does not appear to require admission at this time.   Thank you for the opportunity to consider this patient.  Please reconsult [facility] if additional assistance is needed.    Karmen Bongo MD [facility]   How to contact the [facility] Attending or Consulting provider [place] or covering provider during after hours [place], for this patient?  1. Check the care team in [facility] and look for a) attending/consulting [facility] provider listed and b) the [facility] team listed 2. Log into [email or web address] and use [place]'s universal password to access. If you do not have the password, please contact the hospital operator. 3. Locate the [facility] provider you are looking for under [facility] and page to a number that you can be directly reached. 4. If you still have difficulty reaching the provider, please page the [facility] (Director on Call) for the Hospitalists listed on amion for assistance.   03/09/2019, 10:15 AM

## 2019-03-09 NOTE — Progress Notes (Signed)
Follow up to pre-procedure labs. Unfortunately MELD calculated based on today's labs is 21 which places pt a higher procedural risk.  Considered admission for IVF in hopes to correct some of these labs and be able to do procedure in a few days. However, after reviewing previous labs and discussion with Dr. Lorin Mercy of the hospitalist service, feel that pt does not meet criteria for admission and labs have been at these levels previously. Overall MELD unlikely to improve.  TIPS procedure to be cancelled but will offer paracentesis today, which the pt is agreeable to.  She will then be discharged from IR after para.   Ascencion Dike PA-C Interventional Radiology 03/09/2019 10:44 AM

## 2019-03-09 NOTE — Procedures (Signed)
PROCEDURE SUMMARY:  Successful US guided paracentesis from RLQ.  Yielded 4.9 L of clear yellow fluid.  No immediate complications.  Pt tolerated well.   Specimen was not sent for labs.  EBL < 59mL  Ascencion Dike PA-C 03/09/2019 12:38 PM

## 2019-03-10 ENCOUNTER — Telehealth: Payer: Self-pay | Admitting: Gastroenterology

## 2019-03-10 NOTE — Telephone Encounter (Signed)
Pt's daughter [name] would like to speak with you regarding a procedure that pt was not able to have yesterday at the hospital.

## 2019-03-10 NOTE — Telephone Encounter (Signed)
Daughter of patient called [name]) and said the TIPS procedure was cancelled yesterday, after her Molli Knock had been there for a while. She was told it had something to do with a lab and that they might admit her and do it later in the day. Instead they did a Paracentesis and sent her home. She is confused as to why and said they would not give her more information. I called IR to tried to find out more information and spoke to Alpha. She said it had to do with a Meld score and Dr. Milta Deiters is to call Dr. Havery Moros with more details. I called the daughter back and relayed what I found out and that I am sure Dr. Havery Moros will let her know more after he talks to Dr. Milta Deiters.

## 2019-03-10 NOTE — Telephone Encounter (Signed)
Thanks Customer service manager. I spoke with Dr. Milta Deiters about TIPS. Her MELD score has risen and I think risks may outweigh benefits at this time. I also spoke with Roosevelt Locks from Hepatology and they recommended against it, as she has not been compliant with her meds and continues to use alcohol. Can you help book her a follow up appointment in the clinic with me for reassessment. Thanks

## 2019-03-11 NOTE — Telephone Encounter (Signed)
Called daughter and gave Dr. Doyne Keel comments reference patient's TIPS being cancelled. Scheduled office visit for 04/15/19

## 2019-03-13 LAB — TYPE AND SCREEN
ABO/RH(D): O POS
Antibody Screen: POSITIVE
Donor AG Type: NEGATIVE
Donor AG Type: NEGATIVE
Donor AG Type: NEGATIVE
PT AG Type: POSITIVE
Unit division: 0
Unit division: 0
Unit division: 0
Unit division: 0

## 2019-03-13 LAB — BPAM RBC
Blood Product Expiration Date: 202009242359
Blood Product Expiration Date: 202009242359
Blood Product Expiration Date: 202009272359
Blood Product Expiration Date: 202009292359
Unit Type and Rh: 5100
Unit Type and Rh: 9500
Unit Type and Rh: 9500
Unit Type and Rh: 9500

## 2019-04-01 ENCOUNTER — Telehealth: Payer: Self-pay | Admitting: Gastroenterology

## 2019-04-01 ENCOUNTER — Other Ambulatory Visit: Payer: Self-pay

## 2019-04-01 DIAGNOSIS — R188 Other ascites: Secondary | ICD-10-CM

## 2019-04-01 NOTE — Telephone Encounter (Signed)
Patient scheduled for IR paracentesis at [facility] on 04/05/19 at 9:00am. Albumin to be given if over 5 liters removed. Daughter [name]) knows appt. Information. Also she stated her Mom is not taking any of her meds and is drinking again

## 2019-04-01 NOTE — Telephone Encounter (Signed)
Katie Holland can you clarify if the patient is taking her lasix and aldactone, there has been issues with compliance with this in the past. We can refer for large volume paracentesis and to give albumin if more than 5L removed. I would like to see her back in the office if you can help coordinate a follow up in the next few weeks with me. Thanks.

## 2019-04-05 ENCOUNTER — Encounter (HOSPITAL_COMMUNITY): Payer: Self-pay | Admitting: Radiology

## 2019-04-05 ENCOUNTER — Ambulatory Visit (HOSPITAL_COMMUNITY)
Admission: RE | Admit: 2019-04-05 | Discharge: 2019-04-05 | Disposition: A | Discharge status: Home / Self Care | Payer: Medicare Other | Source: Home / Self Care | Attending: Gastroenterology | Admitting: Gastroenterology

## 2019-04-05 DIAGNOSIS — Z882 Allergy status to sulfonamides status: Secondary | ICD-10-CM | POA: Diagnosis not present

## 2019-04-05 DIAGNOSIS — R188 Other ascites: Secondary | ICD-10-CM

## 2019-04-05 DIAGNOSIS — Z79899 Other long term (current) drug therapy: Secondary | ICD-10-CM | POA: Diagnosis not present

## 2019-04-05 DIAGNOSIS — Z91040 Latex allergy status: Secondary | ICD-10-CM | POA: Diagnosis not present

## 2019-04-05 DIAGNOSIS — Z91041 Radiographic dye allergy status: Secondary | ICD-10-CM | POA: Diagnosis not present

## 2019-04-05 DIAGNOSIS — K219 Gastro-esophageal reflux disease without esophagitis: Secondary | ICD-10-CM | POA: Diagnosis not present

## 2019-04-05 DIAGNOSIS — K7031 Alcoholic cirrhosis of liver with ascites: Secondary | ICD-10-CM | POA: Diagnosis not present

## 2019-04-05 HISTORY — PX: IR PARACENTESIS: IMG2679

## 2019-04-05 MED ORDER — LIDOCAINE HCL 1 % IJ SOLN
INTRAMUSCULAR | Status: DC | PRN
  Administered 2019-04-05: 10 mL

## 2019-04-05 MED ORDER — ALBUMIN HUMAN 25 % IV SOLN
50.0000 g | Freq: Once | INTRAVENOUS | Status: AC
  Administered 2019-04-05: 50 g via INTRAVENOUS

## 2019-04-05 MED ORDER — ALBUMIN HUMAN 25 % IV SOLN
INTRAVENOUS | Status: AC
  Filled 2019-04-05: qty 200

## 2019-04-05 MED ORDER — LIDOCAINE HCL 1 % IJ SOLN
INTRAMUSCULAR | Status: AC
  Filled 2019-04-05: qty 20

## 2019-04-05 NOTE — Procedures (Signed)
PROCEDURE SUMMARY:  Successful US guided paracentesis from RLQ.  Yielded 6.8 L of clear yellow fluid.  No immediate complications.  Pt tolerated well.   Specimen was not sent for labs.  EBL < 49mL  Ascencion Dike PA-C 04/05/2019 10:02 AM

## 2019-04-15 ENCOUNTER — Telehealth: Payer: Self-pay | Admitting: Gastroenterology

## 2019-04-15 ENCOUNTER — Ambulatory Visit: Payer: Medicare Other | Admitting: Gastroenterology

## 2019-04-15 DIAGNOSIS — K7031 Alcoholic cirrhosis of liver with ascites: Secondary | ICD-10-CM

## 2019-04-15 NOTE — Telephone Encounter (Signed)
Pt requested to speak to Dr. Havery Moros.  She reported that she is doing fine and that the medications prescribed are helping her.  She would like to update on her condition.

## 2019-04-15 NOTE — Telephone Encounter (Signed)
Pt called and rescheduled her appt for December. States she is doing good, feeling good and taking her medications. States if Dr. Havery Moros wants to call her that would be fine to discuss anything.

## 2019-04-15 NOTE — Addendum Note (Signed)
Addended by: Rosanne Sack R on: 04/15/2019 04:47 PM   Modules accepted: Orders

## 2019-04-15 NOTE — Telephone Encounter (Signed)
Thanks [employer]. She no showed for today's appointment. She is due for CMET, and INR if you can ask her to come to the lab. Otherwise continue her medications and please make sure she keeps her follow up appointment with me. Thanks

## 2019-04-15 NOTE — Telephone Encounter (Signed)
Spoke with pt and she knows to come for labs next week and to keep her ov as scheduled. Order in epic for labs.

## 2019-04-26 ENCOUNTER — Telehealth: Payer: Self-pay | Admitting: Gastroenterology

## 2019-04-26 ENCOUNTER — Ambulatory Visit: Payer: Self-pay

## 2019-04-26 NOTE — Telephone Encounter (Signed)
Incoming call from Pt.  With a complaint of severe abdominal pain.  Reports that it is on her right side under her rib cage  Onset was 3 to 5 days ago.  A gradual onset.  Rated severe.  Pt.  Reports constipation. Reviewed protocol with Pt.  Recommended pt go to ED.  For further evaluation.  Pt. Voiced understanding.         Reason for Disposition . [1] SEVERE pain (e.g., excruciating) AND [2] present > 1 hour  Answer Assessment - Initial Assessment Questions 1. LOCATION: "Where does it hurt?"      Right side towards the back under ribcage 2. RADIATION: "Does the pain shoot anywhere else?" (e.g., chest, back)      3. ONSET: "When did the pain begin?" (e.g., minutes, hours or days ago)     3 to 5 days 4. SUDDEN: "Gradual or sudden onset?"    gradual 5. PATTERN "Does the pain come and go, or is it constant?"    - If constant: "Is it getting better, staying the same, or worsening?"      (Note: Constant means the pain never goes away completely; most serious pain is constant and it progresses)     - If intermittent: "How long does it last?" "Do you have pain now?"     (Note: Intermittent means the pain goes away completely between bouts)    constant 6. SEVERITY: "How bad is the pain?"  (e.g., Scale 1-10; mild, moderate, or severe)   - MILD (1-3): doesn't interfere with normal activities, abdomen soft and not tender to touch    - MODERATE (4-7): interferes with normal activities or awakens from sleep, tender to touch    - SEVERE (8-10): excruciating pain, doubled over, unable to do any normal activities      Approaching severe 7. RECURRENT SYMPTOM: "Have you ever had this type of abdominal pain before?" If so, ask: "When was the last time?" and "What happened that time?"      *No Answer* 8. CAUSE: "What do you think is causing the abdominal pain?"     *No Answer* 9. RELIEVING/AGGRAVATING FACTORS: "What makes it better or worse?" (e.g., movement, antacids, bowel movement)    none 10.  OTHER SYMPTOMS: "Has there been any vomiting, diarrhea, constipation, or urine problems?"       Denies  Constipation.  11. PREGNANCY: "Is there any chance you are pregnant?" "When was your last menstrual period?"       na  Protocols used: ABDOMINAL PAIN - [facility]

## 2019-04-26 NOTE — Telephone Encounter (Signed)
Pt has an appt to see Janett Billow this Friday 11/6, pt was under the impression that appt was for paracentesis. She states that she needs to have that done asap because her stomach is very distended and cannot wait until ov on Friday. Pls call her.

## 2019-04-26 NOTE — Telephone Encounter (Signed)
Called patient and daughter and let them know her 04/29/19 appt. Is an office visit, not a paracentesis. Daughter said she will come in with her. Also her chart shows her in the hospital and the daughter said she is at home. Gave information error in Epic to [place] and she will follow-up on it.

## 2019-04-26 NOTE — Telephone Encounter (Signed)
Will monitor for ED arrival.  

## 2019-04-27 ENCOUNTER — Telehealth: Payer: Self-pay

## 2019-04-27 MED ORDER — LACTULOSE 10 GM/15ML PO SOLN: 30.0000 g | Freq: Three times a day (TID) | ORAL | 3 refills | Status: DC

## 2019-04-27 NOTE — Telephone Encounter (Signed)
Yes can you please prescribe this for her, thanks. She is also due for an office visit with me if you can coordinate, she no showed her last visit. Thanks

## 2019-04-27 NOTE — Telephone Encounter (Signed)
Incoming fax request from [facility] for Lactulose 10g/70ml take 84ml TID.  I don't see this medication on her med list. Is this a routine medication? please advise. Thank you

## 2019-04-27 NOTE — Telephone Encounter (Signed)
Spoke with pt states that she is taking Lactulose, pt scheduled an appointment with GI for 04/29/19

## 2019-04-27 NOTE — Telephone Encounter (Signed)
Patient has not gone to ED. Please call patient

## 2019-04-27 NOTE — Telephone Encounter (Signed)
She has a follow up schedule for 04-29-2019 with PA Zehr

## 2019-04-28 NOTE — Telephone Encounter (Signed)
Thanks Toni 

## 2019-04-29 ENCOUNTER — Other Ambulatory Visit (INDEPENDENT_AMBULATORY_CARE_PROVIDER_SITE_OTHER): Payer: Medicare Other

## 2019-04-29 ENCOUNTER — Encounter: Payer: Self-pay | Admitting: Gastroenterology

## 2019-04-29 ENCOUNTER — Ambulatory Visit (INDEPENDENT_AMBULATORY_CARE_PROVIDER_SITE_OTHER): Payer: Medicare Other | Admitting: Gastroenterology

## 2019-04-29 VITALS — BP 132/84 | HR 103 | Temp 98.3°F | Ht 64.0 in | Wt 136.4 lb

## 2019-04-29 DIAGNOSIS — K7031 Alcoholic cirrhosis of liver with ascites: Secondary | ICD-10-CM

## 2019-04-29 DIAGNOSIS — K7290 Hepatic failure, unspecified without coma: Secondary | ICD-10-CM | POA: Diagnosis not present

## 2019-04-29 DIAGNOSIS — K7682 Hepatic encephalopathy: Secondary | ICD-10-CM

## 2019-04-29 LAB — CBC WITH DIFFERENTIAL/PLATELET
Basophils Absolute: 0.1 10*3/uL (ref 0.0–0.1)
Basophils Relative: 1.5 % (ref 0.0–3.0)
Eosinophils Absolute: 0.1 10*3/uL (ref 0.0–0.7)
Eosinophils Relative: 3.6 % (ref 0.0–5.0)
HCT: 39.1 % (ref 36.0–46.0)
Hemoglobin: 12.9 g/dL (ref 12.0–15.0)
Lymphocytes Relative: 36.1 % (ref 12.0–46.0)
Lymphs Abs: 1.2 10*3/uL (ref 0.7–4.0)
MCHC: 33 g/dL (ref 30.0–36.0)
MCV: 86.4 fl (ref 78.0–100.0)
Monocytes Absolute: 0.4 10*3/uL (ref 0.1–1.0)
Monocytes Relative: 12.4 % — ABNORMAL HIGH (ref 3.0–12.0)
Neutro Abs: 1.6 10*3/uL (ref 1.4–7.7)
Neutrophils Relative %: 46.4 % (ref 43.0–77.0)
Platelets: 119 10*3/uL — ABNORMAL LOW (ref 150.0–400.0)
RBC: 4.53 Mil/uL (ref 3.87–5.11)
RDW: 19.5 % — ABNORMAL HIGH (ref 11.5–15.5)
WBC: 3.4 10*3/uL — ABNORMAL LOW (ref 4.0–10.5)

## 2019-04-29 LAB — COMPREHENSIVE METABOLIC PANEL
ALT: 15 U/L (ref 0–35)
AST: 49 U/L — ABNORMAL HIGH (ref 0–37)
Albumin: 3.1 g/dL — ABNORMAL LOW (ref 3.5–5.2)
Alkaline Phosphatase: 181 U/L — ABNORMAL HIGH (ref 39–117)
BUN: 6 mg/dL (ref 6–23)
CO2: 26 mEq/L (ref 19–32)
Calcium: 9.1 mg/dL (ref 8.4–10.5)
Chloride: 105 mEq/L (ref 96–112)
Creatinine, Ser: 0.53 mg/dL (ref 0.40–1.20)
GFR: 145.28 mL/min (ref >60.00)
Glucose, Bld: 82 mg/dL (ref 70–99)
Potassium: 3.6 mEq/L (ref 3.5–5.1)
Sodium: 140 mEq/L (ref 135–145)
Total Bilirubin: 3.3 mg/dL — ABNORMAL HIGH (ref 0.2–1.2)
Total Protein: 7.2 g/dL (ref 6.0–8.3)

## 2019-04-29 LAB — PROTIME-INR
INR: 1.4 ratio — ABNORMAL HIGH (ref 0.8–1.0)
Prothrombin Time: 15.8 s — ABNORMAL HIGH (ref 9.6–13.1)

## 2019-04-29 NOTE — Progress Notes (Signed)
04/29/2019 Katie Holland [id number] 01/29/1966   HISTORY OF PRESENT ILLNESS:  This is a 53 year old female who is a patient of Dr. Doyne Keel.  She is here today with her daughter Katie Holland.  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 She has had a few admissions for variceal bleeding early in her course at the time of her diagnosis. She has had multiple endoscopies since then, a total of 25 bands have been applied to her esophagus over multiple sessions given the large size of her varices.  Last 2 EGDs both in May 2020 and August 2020 showed no evidence of varices.  She has not been on beta blocker due to difficulty managing ascites.  She has been getting paracentesis periodically.  She is not thought to be a good candidate for TIPS given history of portal vein thrombosis and high MELD, when last evaluated by IR. She has been on lasix 40mg  BID and aldactone 200mg  / day. She continues to have difficulty managing ascites. Complaining today of abdominal distention.  She has been compliant with lactulose and rifaximin and working well to keep encephalopathy controlled. She seems very appropriate with clear mental status today.  She had abdominal ultrasound in June 2020 with no evidence of [facility] and then she had a CT angio of the abdomen in July 2020 with no suspicious lesions.  CT angio was for pre-TIPS evaluation.  It showed a large amount of ascites and large right pleural effusion.  Once again deemed to not be a TIPS candidate at that time.  Most recent endoscopies: EGD 07/07/18 - 4 bands placed EGD 07/27/18 - 3 bands placed EGD 08/30/18 - 3 bands placed  EGD 10/2018- no bands placed EGD 01/2019- no bands placed  Still drinking ETOH.      Past Medical History:  Diagnosis Date  . Alcoholic liver disease ([place])   . Chronic back pain    on disability  . Cirrhosis of liver ([place])   . Esophageal  varices ([place])   . GERD (gastroesophageal reflux disease)   . Headache   . Jaundice    Past Surgical History:  Procedure Laterality Date  . BACK SURGERY     L3-L4  . ESOPHAGEAL BANDING  04/25/2018   Procedure: ESOPHAGEAL BANDING;  Surgeon: Yetta Flock, MD;  Location: [facility] ENDOSCOPY;  Service: Gastroenterology;;  . ESOPHAGEAL BANDING  04/27/2018   Procedure: ESOPHAGEAL BANDING;  Surgeon: Milus Banister, MD;  Location: [place];  Service: Endoscopy;;  . ESOPHAGEAL BANDING  06/02/2018   Procedure: ESOPHAGEAL BANDING;  Surgeon: Doran Stabler, MD;  Location: [place];  Service: Gastroenterology;;  . ESOPHAGEAL BANDING N/A 07/07/2018   Procedure: ESOPHAGEAL BANDING;  Surgeon: Yetta Flock, MD;  Location: [facility] ENDOSCOPY;  Service: Gastroenterology;  Laterality: N/A;  . ESOPHAGEAL BANDING  07/27/2018   Procedure: ESOPHAGEAL BANDING;  Surgeon: Yetta Flock, MD;  Location: [facility] ENDOSCOPY;  Service: Gastroenterology;;  . ESOPHAGEAL BANDING  08/30/2018   Procedure: ESOPHAGEAL BANDING;  Surgeon: Yetta Flock, MD;  Location: [facility] ENDOSCOPY;  Service: Gastroenterology;;  . ESOPHAGOGASTRODUODENOSCOPY N/A 04/27/2018   Procedure: ESOPHAGOGASTRODUODENOSCOPY (EGD);  Surgeon: Milus Banister, MD;  Location: [facility] ENDOSCOPY;  Service: Endoscopy;  Laterality: N/A;  . ESOPHAGOGASTRODUODENOSCOPY (EGD) WITH PROPOFOL N/A 04/25/2018   Procedure: ESOPHAGOGASTRODUODENOSCOPY (EGD) WITH PROPOFOL;  Surgeon: Yetta Flock, MD;  Location: [place];  Service: Gastroenterology;  Laterality: N/A;  . ESOPHAGOGASTRODUODENOSCOPY (EGD) WITH  PROPOFOL N/A 06/02/2018   Procedure: ESOPHAGOGASTRODUODENOSCOPY (EGD) WITH PROPOFOL;  Surgeon: Doran Stabler, MD;  Location: [place];  Service: Gastroenterology;  Laterality: N/A;  . ESOPHAGOGASTRODUODENOSCOPY (EGD) WITH PROPOFOL N/A 07/07/2018   Procedure: ESOPHAGOGASTRODUODENOSCOPY (EGD) WITH PROPOFOL;  Surgeon: Yetta Flock, MD;   Location: [facility] ENDOSCOPY;  Service: Gastroenterology;  Laterality: N/A;  . ESOPHAGOGASTRODUODENOSCOPY (EGD) WITH PROPOFOL N/A 07/27/2018   Procedure: ESOPHAGOGASTRODUODENOSCOPY (EGD) WITH PROPOFOL;  Surgeon: Yetta Flock, MD;  Location: [facility] ENDOSCOPY;  Service: Gastroenterology;  Laterality: N/A;  . ESOPHAGOGASTRODUODENOSCOPY (EGD) WITH PROPOFOL N/A 08/30/2018   Procedure: ESOPHAGOGASTRODUODENOSCOPY (EGD) WITH PROPOFOL;  Surgeon: Yetta Flock, MD;  Location: [facility] ENDOSCOPY;  Service: Gastroenterology;  Laterality: N/A;  . ESOPHAGOGASTRODUODENOSCOPY (EGD) WITH PROPOFOL N/A 11/08/2018   Procedure: ESOPHAGOGASTRODUODENOSCOPY (EGD) WITH PROPOFOL;  Surgeon: Yetta Flock, MD;  Location: [facility] ENDOSCOPY;  Service: Gastroenterology;  Laterality: N/A;  . ESOPHAGOGASTRODUODENOSCOPY (EGD) WITH PROPOFOL N/A 01/31/2019   Procedure: ESOPHAGOGASTRODUODENOSCOPY (EGD) WITH PROPOFOL;  Surgeon: Yetta Flock, MD;  Location: [facility] ENDOSCOPY;  Service: Gastroenterology;  Laterality: N/A;  . IR PARACENTESIS  04/26/2018  . IR PARACENTESIS  05/18/2018  . IR PARACENTESIS  05/31/2018  . IR PARACENTESIS  06/30/2018  . IR PARACENTESIS  08/05/2018  . IR PARACENTESIS  08/31/2018  . IR PARACENTESIS  10/08/2018  . IR PARACENTESIS  10/28/2018  . IR PARACENTESIS  02/14/2019  . IR PARACENTESIS  03/09/2019  . IR PARACENTESIS  04/05/2019  . IR RADIOLOGIST EVAL & MGMT  12/23/2018  . IR THORACENTESIS ASP PLEURAL SPACE W/IMG GUIDE  12/10/2018  . IR THORACENTESIS ASP PLEURAL SPACE W/IMG GUIDE  01/21/2019    reports that she has been smoking cigarettes. She has a 20.00 pack-year smoking history. She has never used smokeless tobacco. She reports current alcohol use. She reports previous drug use. family history is not on file. Allergies  Allergen Reactions  . Contrast Media [Iodinated Diagnostic Agents] Itching and Swelling    Needs 13-hr prep  . Latex Itching, Swelling and Other (See Comments)    No breathing impairment,  however  . Sulfa Antibiotics Itching, Swelling and Other (See Comments)    No breathing impairment, however      Facility-Administered Encounter Medications as of 04/29/2019  Medication  . 0.9 %  sodium chloride infusion (Manually program via Guardrails IV Fluids)  . 0.9 %  sodium chloride infusion  . lactated ringers infusion  . lactated ringers infusion  . lidocaine (XYLOCAINE) 1 % (with pres) injection   Outpatient Encounter Medications as of 04/29/2019  Medication Sig  . acetaminophen (TYLENOL) 500 MG tablet Take 1,000 mg by mouth 2 (two) times daily as needed for mild pain or headache.   . folic acid (FOLVITE) 1 MG tablet Take 1 mg by mouth daily.  . furosemide (LASIX) 20 MG tablet Take 2 tablets (40 mg total) by mouth 2 (two) times daily.  [name] lactulose (CHRONULAC) 10 GM/15ML solution Take 45 mLs (30 g total) by mouth 3 (three) times daily.  . Multiple Vitamin (MULTIVITAMIN WITH MINERALS) TABS tablet Take 1 tablet by mouth daily.  . nicotine (NICODERM CQ - DOSED IN MG/24 HOURS) 14 mg/24hr patch Place 1 patch (14 mg total) onto the skin daily. (Patient taking differently: Place 14 mg onto the skin daily as needed (smoking cessation). )  . potassium chloride (K-DUR) 10 MEQ tablet Take 2 tablets (20 mEq total) by mouth daily.  . rifaximin (XIFAXAN) 550 MG TABS tablet Take 1 tablet (550 mg total) by mouth  2 (two) times daily.  [name] spironolactone (ALDACTONE) 100 MG tablet Take 2 tablets (200 mg total) by mouth daily.     REVIEW OF SYSTEMS  : All other systems reviewed and negative except where noted in the History of Present Illness.   PHYSICAL EXAM: BP 132/84   Pulse (!) 103   Temp 98.3 F (36.8 C)   Ht 5\' 4"  (1.626 m)   Wt 136 lb 6.4 oz (61.9 kg)   BMI 23.41 kg/m  General: Chronically ill-appearing black female in no acute distress Head: Normocephalic and atraumatic Eyes:  Sclerae anicteric, conjunctiva pink. Ears: Normal auditory acuity Lungs: Clear throughout to  auscultation; no increased WOB. Heart: Regular rate and rhythm; no M/R/G. Abdomen: Soft, distended.  BS present.  Non-tender. Musculoskeletal: Symmetrical with no gross deformities  Skin: No lesions on visible extremities Extremities: No edema  Neurological: Alert oriented x 4, grossly non-focal Psychological:  Alert and cooperative. Normal mood and affect  ASSESSMENT AND PLAN: Alcoholic cirrhosis with ascites (also large right pleural effusion/hepatic hydrothorax in July on CT scan)/ esophageal varices with bleeding / hepatic encephalopathy - decompensated.  Fortunately she has responded well to banding without rebleeding, although it has taken 25 bands thus far to treat the varices, last 2 EGD's without sign of varices. Not using beta blocker due to ascites, not a candidate for TIPS given high MELD and portal vein thrombosis. HE seems well controlled. Still needing large volume para PRN. She had not yet been referred to Hepatology due to alcohol use, which she continues to use.  Taking lasix 40 mg BID and aldactone 100 mg BID.  Will repeat CBC, CMP, PT/INR, and AFP today.  Will schedule paracentesis, no more then 6 Liters removed, give albumin, check fluid studies for cell count and culture.  Needs ETOH abstinence.  2 gram sodium diet.  Continue lactulose and xifaxan.  Will follow-up in 2 months.   CC:  Billie Ruddy, MD

## 2019-04-29 NOTE — Patient Instructions (Addendum)
If you are age 54 or older, your body mass index should be between 23-30. Your Body mass index is 23.41 kg/m. If this is out of the aforementioned range listed, please consider follow up with your Primary Care Provider.  If you are age 5 or younger, your body mass index should be between 19-25. Your Body mass index is 23.41 kg/m. If this is out of the aformentioned range listed, please consider follow up with your Primary Care Provider.   Your provider has requested that you go to the basement level for lab work before leaving today. Press "B" on the elevator. The lab is located at the first door on the left as you exit the elevator.   You have been scheduled for IR paracentesis at [facility] Radiology on Tuesday, 05/03/19 at 10 am.  Arrive 15 minutes early.  Follow up with Dr. Havery Moros on 06/23/19 at 9:50 am  Thank you for choosing me and [facility].   Alonza Bogus, PA-C

## 2019-05-02 LAB — AFP TUMOR MARKER: AFP-Tumor Marker: 2 ng/mL

## 2019-05-03 ENCOUNTER — Encounter (HOSPITAL_COMMUNITY): Payer: Self-pay | Admitting: Physician Assistant

## 2019-05-03 ENCOUNTER — Ambulatory Visit (HOSPITAL_COMMUNITY)
Admission: RE | Admit: 2019-05-03 | Discharge: 2019-05-03 | Disposition: A | Discharge status: Home / Self Care | Payer: Medicare Other | Source: Ambulatory Visit | Attending: Gastroenterology | Admitting: Gastroenterology

## 2019-05-03 DIAGNOSIS — K7290 Hepatic failure, unspecified without coma: Secondary | ICD-10-CM

## 2019-05-03 DIAGNOSIS — R188 Other ascites: Secondary | ICD-10-CM | POA: Diagnosis not present

## 2019-05-03 DIAGNOSIS — K7031 Alcoholic cirrhosis of liver with ascites: Secondary | ICD-10-CM

## 2019-05-03 DIAGNOSIS — K7682 Hepatic encephalopathy: Secondary | ICD-10-CM

## 2019-05-03 HISTORY — PX: IR PARACENTESIS: IMG2679

## 2019-05-03 LAB — GRAM STAIN

## 2019-05-03 LAB — BODY FLUID CELL COUNT WITH DIFFERENTIAL
Lymphs, Fluid: 13 %
Monocyte-Macrophage-Serous Fluid: 78 % (ref 50–90)
Neutrophil Count, Fluid: 9 % (ref 0–25)
Total Nucleated Cell Count, Fluid: 70 cu mm (ref 0–1000)

## 2019-05-03 MED ORDER — ALBUMIN HUMAN 25 % IV SOLN
50.0000 g | Freq: Once | INTRAVENOUS | Status: AC
  Administered 2019-05-03: 11:00:00 50 g via INTRAVENOUS

## 2019-05-03 MED ORDER — LIDOCAINE HCL (PF) 1 % IJ SOLN
INTRAMUSCULAR | Status: AC | PRN
  Administered 2019-05-03: 10 mL

## 2019-05-03 MED ORDER — ALBUMIN HUMAN 25 % IV SOLN
INTRAVENOUS | Status: AC
  Filled 2019-05-03: qty 200

## 2019-05-03 MED ORDER — LIDOCAINE HCL 1 % IJ SOLN
INTRAMUSCULAR | Status: AC
  Filled 2019-05-03: qty 20

## 2019-05-03 NOTE — Procedures (Signed)
PROCEDURE SUMMARY:  Successful image-guided paracentesis from the right lower abdomen.  Yielded 6.0 liters of clear yellow fluid.  No immediate complications.  EBL: zero Patient tolerated well.   Specimen was sent for labs.  Please see imaging section of Epic for full dictation.  Joaquim Nam PA-C 05/03/2019 10:59 AM

## 2019-05-04 LAB — PATHOLOGIST SMEAR REVIEW

## 2019-05-06 ENCOUNTER — Encounter: Payer: Self-pay | Admitting: Gastroenterology

## 2019-05-06 ENCOUNTER — Other Ambulatory Visit (HOSPITAL_COMMUNITY): Payer: Self-pay | Admitting: Physician Assistant

## 2019-05-06 ENCOUNTER — Other Ambulatory Visit (HOSPITAL_COMMUNITY): Payer: Self-pay | Admitting: Orthopedic Surgery

## 2019-05-06 DIAGNOSIS — K7290 Hepatic failure, unspecified without coma: Secondary | ICD-10-CM | POA: Insufficient documentation

## 2019-05-06 DIAGNOSIS — K7682 Hepatic encephalopathy: Secondary | ICD-10-CM | POA: Insufficient documentation

## 2019-05-06 NOTE — Progress Notes (Signed)
Agree with assessment with the following thoughts: - decompensated alcoholic cirrhosis. I have spoken with the patient and daughter on numerous occasions. She will not stop drinking alcohol and has not been compliant with her medications. She has seen Hepatology, they were very concerned about proceeding with TIPS given her lack of compliance and ongoing alcohol use. I spoke with IR and we elected to hold off for now and manage this with paracentesis as needed. Compliance with her diuretics has been an issue in the past, will need to make sure renal function is stable. Alcohol abstinence is essential if she wants to recover from this, otherwise I think she will continue to deteriorate. She has declined assistance with alcohol cessation in the past. She does warrant follow up EGD for surveillance of varices, she is on our list for hospital follow up EGD.

## 2019-05-08 LAB — CULTURE, BODY FLUID W GRAM STAIN -BOTTLE: Culture: NO GROWTH

## 2019-06-01 ENCOUNTER — Ambulatory Visit: Payer: Medicare Other | Admitting: Gastroenterology

## 2019-06-06 ENCOUNTER — Telehealth: Payer: Self-pay | Admitting: Gastroenterology

## 2019-06-06 NOTE — Telephone Encounter (Signed)
We can coordinate paracentesis - if > 5 L they should give albumin. Please send for cell count.  Otherwise, can you clarify if the patient is taking her aldactone / lasix as recommended? She has had compliance issues with this, and if she is still drinking alcohol this will also be making things worse. Thanks

## 2019-06-06 NOTE — Telephone Encounter (Signed)
Patient's daughter is calling and requesting a paracentesis, can I order one ?

## 2019-06-07 ENCOUNTER — Other Ambulatory Visit: Payer: Self-pay

## 2019-06-07 DIAGNOSIS — K7031 Alcoholic cirrhosis of liver with ascites: Secondary | ICD-10-CM

## 2019-06-07 NOTE — Telephone Encounter (Signed)
Called daughter [name]) and asked her to have her Mom increase her Lasix to 40mg  BID, 3-4 days after her paracentesis on 06/10/19 and to continue her Aldactone dose of 200mg  daily. I will call her and remind her to come in for BMET 1 week after increase

## 2019-06-07 NOTE — Telephone Encounter (Signed)
Patient scheduled for paracentesis at [facility] on 06/10/19 at 2:00pm (first available) Spoke to daughter and she says her Mom is taking her aldactone and lasix, but is still drinking

## 2019-06-07 NOTE — Telephone Encounter (Signed)
Okay thanks. She can have the paracentesis. A few days after that, she should increase her lasix to 40mg  BID and continue the aldactone at 200mg  / day. She will need a repeat BMET in one week after that if she increases the diuretics, this is essential to stay on top of the renal function. If she does not stop drinking alcohol, this is not going to get better.

## 2019-06-10 ENCOUNTER — Ambulatory Visit (HOSPITAL_COMMUNITY): Admission: RE | Admit: 2019-06-10 | Payer: Medicare Other | Source: Ambulatory Visit

## 2019-06-10 ENCOUNTER — Telehealth: Payer: Self-pay | Admitting: Gastroenterology

## 2019-06-10 NOTE — Telephone Encounter (Signed)
Dr Havery Moros please advise.  The Katie Holland daughter states that the Katie Holland did not have paracentesis completed because the Katie Holland is drinking.  The daughter is concerned and wants to know what she should do?

## 2019-06-10 NOTE — Telephone Encounter (Signed)
Called patient's daughter Katie Holland -the patient is not taking any of her meds, declining paracentesis, continues to drink alcohol routinely. She has decompensated cirrhosis and is progressively worsening, she is not a transplant candidate due to alcohol. We had a very frank discussion, her mother must stop drinking alcohol or this is going to lead to her demise. Daughter understands, having a very difficult time with her mother about this. I offered a referral to psychologist for alcohol therapy, daughter wants [place] to set that up and will bring in her mother. If we can reschedule the paracentesis she thinks her mother will be amenable, she thinks she is quite tight and uncomfortable, despite declining it today.     [name] can you please refer this patient to psychology for alcohol counseling. Please set up another paracentesis for next week if you don't mind, and I will see the patient in 2 weeks at her scheduled appointment.  Thanks

## 2019-06-13 ENCOUNTER — Telehealth: Payer: Self-pay | Admitting: Gastroenterology

## 2019-06-13 ENCOUNTER — Other Ambulatory Visit: Payer: Self-pay

## 2019-06-13 ENCOUNTER — Telehealth: Payer: Self-pay

## 2019-06-13 DIAGNOSIS — K7031 Alcoholic cirrhosis of liver with ascites: Secondary | ICD-10-CM

## 2019-06-13 NOTE — Telephone Encounter (Signed)
See phone note

## 2019-06-13 NOTE — Telephone Encounter (Signed)
Left message with [name] (pt's daughter) to please call back.

## 2019-06-13 NOTE — Telephone Encounter (Signed)
Scheduled patient for first available paracentesis at [facility] on 06/20/19 to arrive at 9:45am. Also did referral to psychology for alcohol counseling. Daughter aware of all the above

## 2019-06-20 ENCOUNTER — Ambulatory Visit (HOSPITAL_COMMUNITY)
Admission: RE | Admit: 2019-06-20 | Discharge: 2019-06-20 | Disposition: A | Discharge status: Home / Self Care | Payer: Medicare Other | Source: Ambulatory Visit | Attending: Gastroenterology | Admitting: Gastroenterology

## 2019-06-20 ENCOUNTER — Other Ambulatory Visit: Payer: Self-pay

## 2019-06-20 ENCOUNTER — Telehealth: Payer: Self-pay

## 2019-06-20 DIAGNOSIS — K7031 Alcoholic cirrhosis of liver with ascites: Secondary | ICD-10-CM | POA: Diagnosis not present

## 2019-06-20 DIAGNOSIS — R188 Other ascites: Secondary | ICD-10-CM

## 2019-06-20 HISTORY — PX: IR PARACENTESIS: IMG2679

## 2019-06-20 LAB — BODY FLUID CELL COUNT WITH DIFFERENTIAL
Lymphs, Fluid: 20 %
Monocyte-Macrophage-Serous Fluid: 73 % (ref 50–90)
Neutrophil Count, Fluid: 5 % (ref 0–25)
Total Nucleated Cell Count, Fluid: 94 cu mm (ref 0–1000)

## 2019-06-20 MED ORDER — ALBUMIN HUMAN 25 % IV SOLN
INTRAVENOUS | Status: AC
  Administered 2019-06-20: 50 g via INTRAVENOUS
  Filled 2019-06-20: qty 200

## 2019-06-20 MED ORDER — LIDOCAINE HCL (PF) 1 % IJ SOLN
INTRAMUSCULAR | Status: DC | PRN
  Administered 2019-06-20: 5 mL

## 2019-06-20 MED ORDER — LIDOCAINE HCL 1 % IJ SOLN
INTRAMUSCULAR | Status: AC
  Filled 2019-06-20: qty 20

## 2019-06-20 MED ORDER — ALBUMIN HUMAN 25 % IV SOLN: 50.0000 g | Freq: Once | INTRAVENOUS | Status: AC

## 2019-06-20 NOTE — Telephone Encounter (Signed)
-----   Message from Hughie Closs, RN sent at 06/07/2019  2:53 PM EST ----- Call daughter and have pt. Come in for a BMEt on 06/22/19 (or 06/23/19 with ov)

## 2019-06-20 NOTE — CV Procedure (Signed)
Ultrasound-guided diagnostic and therapeutic paracentesis performed yielding 9.2 liters of straw colored fluid.  Fluid was sent to lab for analysis. No immediate complications. EBL is none.

## 2019-06-20 NOTE — Telephone Encounter (Signed)
Called daughter(Nicole) and reminded her to bring her Mom in for BMET this week, before office visit with Dr. Havery Moros on 06/23/19

## 2019-06-22 ENCOUNTER — Other Ambulatory Visit (INDEPENDENT_AMBULATORY_CARE_PROVIDER_SITE_OTHER): Payer: Medicare Other

## 2019-06-22 DIAGNOSIS — K7031 Alcoholic cirrhosis of liver with ascites: Secondary | ICD-10-CM | POA: Diagnosis not present

## 2019-06-22 LAB — BASIC METABOLIC PANEL
BUN: 6 mg/dL (ref 6–23)
CO2: 26 mEq/L (ref 19–32)
Calcium: 8.9 mg/dL (ref 8.4–10.5)
Chloride: 107 mEq/L (ref 96–112)
Creatinine, Ser: 0.55 mg/dL (ref 0.40–1.20)
GFR: 139.12 mL/min (ref >60.00)
Glucose, Bld: 88 mg/dL (ref 70–99)
Potassium: 3.6 mEq/L (ref 3.5–5.1)
Sodium: 140 mEq/L (ref 135–145)

## 2019-06-22 LAB — PATHOLOGIST SMEAR REVIEW

## 2019-06-23 ENCOUNTER — Encounter: Payer: Self-pay | Admitting: Gastroenterology

## 2019-06-23 ENCOUNTER — Ambulatory Visit (INDEPENDENT_AMBULATORY_CARE_PROVIDER_SITE_OTHER): Payer: Medicare Other | Admitting: Gastroenterology

## 2019-06-23 VITALS — BP 110/78 | HR 98 | Temp 98.3°F | Ht 64.0 in | Wt 124.0 lb

## 2019-06-23 DIAGNOSIS — K7290 Hepatic failure, unspecified without coma: Secondary | ICD-10-CM | POA: Diagnosis not present

## 2019-06-23 DIAGNOSIS — F1029 Alcohol dependence with unspecified alcohol-induced disorder: Secondary | ICD-10-CM | POA: Diagnosis not present

## 2019-06-23 DIAGNOSIS — K7031 Alcoholic cirrhosis of liver with ascites: Secondary | ICD-10-CM

## 2019-06-23 DIAGNOSIS — I8501 Esophageal varices with bleeding: Secondary | ICD-10-CM

## 2019-06-23 DIAGNOSIS — I8511 Secondary esophageal varices with bleeding: Secondary | ICD-10-CM | POA: Diagnosis not present

## 2019-06-23 DIAGNOSIS — K7682 Hepatic encephalopathy: Secondary | ICD-10-CM

## 2019-06-23 MED ORDER — RIFAXIMIN 550 MG PO TABS: 550.0000 mg | ORAL_TABLET | Freq: Two times a day (BID) | ORAL | 5 refills | Status: DC

## 2019-06-23 MED ORDER — NA SULFATE-K SULFATE-MG SULF 17.5-3.13-1.6 GM/177ML PO SOLN: 1.0000 | Freq: Once | ORAL | 0 refills | Status: AC

## 2019-06-23 MED ORDER — SPIRONOLACTONE 100 MG PO TABS: 100.0000 mg | ORAL_TABLET | Freq: Every day | ORAL | 3 refills | Status: DC

## 2019-06-23 MED ORDER — FUROSEMIDE 20 MG PO TABS: 20.0000 mg | ORAL_TABLET | Freq: Two times a day (BID) | ORAL | 3 refills | Status: DC

## 2019-06-23 NOTE — Patient Instructions (Addendum)
If you are age 54 or older, your body mass index should be between 23-30. Your Body mass index is 21.28 kg/m. If this is out of the aforementioned range listed, please consider follow up with your Primary Care Provider.  If you are age 37 or younger, your body mass index should be between 19-25. Your Body mass index is 21.28 kg/m. If this is out of the aformentioned range listed, please consider follow up with your Primary Care Provider.   Your provider has requested that you go to the basement level for lab work on 07/07/19. Press "B" on the elevator. The lab is located at the first door on the left as you exit the elevator.  You have been scheduled for an abdominal ultrasound at [facility] Radiology (1st floor of hospital) on Friday 07/01/19 at 8 am. Please arrive 15 minutes prior to your appointment for registration. Make certain not to have anything to eat or drink 6 hours prior to your appointment. Should you need to reschedule your appointment, please contact radiology at [phone]. This test typically takes about 30 minutes to perform.  Referral placed to [facility].

## 2019-06-23 NOTE — Progress Notes (Signed)
HPI :  53 y/o female here for a follow up visit. She has decompensated alcoholic cirrhosis complicated by ascites and bleeding esophageal varices, as well as hepatic encephalopathy. She has had a few admissions for variceal bleeding at the end of 2019, she has required over 25 bands placed since November 2019 for eradication of her varices.  Her last endoscopy was in August 2020 at which time she had no banding needed. She has not been on a beta-blocker due to difficulty managing her ascites.  She has had a challenging time with ongoing ascites management.  She is scheduled to take Lasix 20 mg twice a day and Aldactone 100 mg a day however daughter endorses she has not been compliant with this.  Patient states she ran out of Lasix over a month ago however failed to let us know this, daughter states this is not an issue with obtaining her medicines, she has told me in the past that the patient has them at home but does not take them.  Patient states she is willing to take the diuretics if she has them.  She has been requiring large volume paracentesis about once per month.  I previously had her evaluated for a TIPS procedure with interventional radiology and I had discussed her case with hepatology.  They feel she is too high risk for TIPS due to her ongoing alcohol use and lack of compliance with her medications.  We have held off on TIPS thus far.  The daughter has contacted me by phone previously, has been very upset that her mother has not stopped drinking alcohol.  I had a very frank discussion with the patient about her alcohol use today.  She states she does not drink like she used to however still has a glass of wine on occasion but not daily.  We discussed this at length.  She denies any withdrawal symptoms and feels like she can stop if she wants to.  Otherwise has not had any issues with encephalopathy recently.  She states she is compliant with her lactulose.  She states she ran of her  rifaximin a while ago and has not resumed it.  Of note she had a questionable nodular-appearing omentum with an elevated CA-125 level. Ascitic cytology 2 has been negative. She had a CT scan which did not show any problems in the pelvis her ovaries. She has no evidence of HCC.  Most recent endoscopies: EGD 07/07/18 - 4 bands placed EGD 07/27/18 - 3 bands placed EGD 08/30/18 - 3 bands placed  EGD 11/08/18 -no varices amenable to banding, portal hypertensive gastritis EGD 01/31/19 - portal hypertensive gastritis, no varices  CT angio abdomen 01/01/19 - large varices noted on imaging, cirrhosis with ascites, R pleural effusion  Paracentesis 06/20/19 - 9.2 L     Past Medical History:  Diagnosis Date  . Alcoholic liver disease ([place])   . Chronic back pain    on disability  . Cirrhosis of liver ([place])   . Esophageal varices ([place])   . GERD (gastroesophageal reflux disease)   . Headache   . Jaundice      Past Surgical History:  Procedure Laterality Date  . BACK SURGERY     L3-L4  . ESOPHAGEAL BANDING  04/25/2018   Procedure: ESOPHAGEAL BANDING;  Surgeon: Yetta Flock, MD;  Location: [facility] ENDOSCOPY;  Service: Gastroenterology;;  . ESOPHAGEAL BANDING  04/27/2018   Procedure: ESOPHAGEAL BANDING;  Surgeon: Milus Banister, MD;  Location: [place];  Service:  Endoscopy;;  . ESOPHAGEAL BANDING  06/02/2018   Procedure: ESOPHAGEAL BANDING;  Surgeon: Doran Stabler, MD;  Location: [place];  Service: Gastroenterology;;  . ESOPHAGEAL BANDING N/A 07/07/2018   Procedure: ESOPHAGEAL BANDING;  Surgeon: Yetta Flock, MD;  Location: [facility] ENDOSCOPY;  Service: Gastroenterology;  Laterality: N/A;  . ESOPHAGEAL BANDING  07/27/2018   Procedure: ESOPHAGEAL BANDING;  Surgeon: Yetta Flock, MD;  Location: [facility] ENDOSCOPY;  Service: Gastroenterology;;  . ESOPHAGEAL BANDING  08/30/2018   Procedure: ESOPHAGEAL BANDING;  Surgeon: Yetta Flock, MD;  Location: [facility] ENDOSCOPY;  Service:  Gastroenterology;;  . ESOPHAGOGASTRODUODENOSCOPY N/A 04/27/2018   Procedure: ESOPHAGOGASTRODUODENOSCOPY (EGD);  Surgeon: Milus Banister, MD;  Location: [facility] ENDOSCOPY;  Service: Endoscopy;  Laterality: N/A;  . ESOPHAGOGASTRODUODENOSCOPY (EGD) WITH PROPOFOL N/A 04/25/2018   Procedure: ESOPHAGOGASTRODUODENOSCOPY (EGD) WITH PROPOFOL;  Surgeon: Yetta Flock, MD;  Location: [place];  Service: Gastroenterology;  Laterality: N/A;  . ESOPHAGOGASTRODUODENOSCOPY (EGD) WITH PROPOFOL N/A 06/02/2018   Procedure: ESOPHAGOGASTRODUODENOSCOPY (EGD) WITH PROPOFOL;  Surgeon: Doran Stabler, MD;  Location: [place];  Service: Gastroenterology;  Laterality: N/A;  . ESOPHAGOGASTRODUODENOSCOPY (EGD) WITH PROPOFOL N/A 07/07/2018   Procedure: ESOPHAGOGASTRODUODENOSCOPY (EGD) WITH PROPOFOL;  Surgeon: Yetta Flock, MD;  Location: [facility] ENDOSCOPY;  Service: Gastroenterology;  Laterality: N/A;  . ESOPHAGOGASTRODUODENOSCOPY (EGD) WITH PROPOFOL N/A 07/27/2018   Procedure: ESOPHAGOGASTRODUODENOSCOPY (EGD) WITH PROPOFOL;  Surgeon: Yetta Flock, MD;  Location: [facility] ENDOSCOPY;  Service: Gastroenterology;  Laterality: N/A;  . ESOPHAGOGASTRODUODENOSCOPY (EGD) WITH PROPOFOL N/A 08/30/2018   Procedure: ESOPHAGOGASTRODUODENOSCOPY (EGD) WITH PROPOFOL;  Surgeon: Yetta Flock, MD;  Location: [facility] ENDOSCOPY;  Service: Gastroenterology;  Laterality: N/A;  . ESOPHAGOGASTRODUODENOSCOPY (EGD) WITH PROPOFOL N/A 11/08/2018   Procedure: ESOPHAGOGASTRODUODENOSCOPY (EGD) WITH PROPOFOL;  Surgeon: Yetta Flock, MD;  Location: [facility] ENDOSCOPY;  Service: Gastroenterology;  Laterality: N/A;  . ESOPHAGOGASTRODUODENOSCOPY (EGD) WITH PROPOFOL N/A 01/31/2019   Procedure: ESOPHAGOGASTRODUODENOSCOPY (EGD) WITH PROPOFOL;  Surgeon: Yetta Flock, MD;  Location: [facility] ENDOSCOPY;  Service: Gastroenterology;  Laterality: N/A;  . IR PARACENTESIS  04/26/2018  . IR PARACENTESIS  05/18/2018  . IR PARACENTESIS  05/31/2018  . IR  PARACENTESIS  06/30/2018  . IR PARACENTESIS  08/05/2018  . IR PARACENTESIS  08/31/2018  . IR PARACENTESIS  10/08/2018  . IR PARACENTESIS  10/28/2018  . IR PARACENTESIS  02/14/2019  . IR PARACENTESIS  03/09/2019  . IR PARACENTESIS  04/05/2019  . IR PARACENTESIS  05/03/2019  . IR PARACENTESIS  06/20/2019  . IR RADIOLOGIST EVAL & MGMT  12/23/2018  . IR THORACENTESIS ASP PLEURAL SPACE W/IMG GUIDE  12/10/2018  . IR THORACENTESIS ASP PLEURAL SPACE W/IMG GUIDE  01/21/2019   Family History  Problem Relation Age of Onset  . Colon cancer Neg Hx   . Esophageal cancer Neg Hx   . Pancreatic cancer Neg Hx   . Stomach cancer Neg Hx    Social History   Tobacco Use  . Smoking status: Current Every Day Smoker    Packs/day: 1.00    Years: 20.00    Pack years: 20.00    Types: Cigarettes  . Smokeless tobacco: Never Used  . Tobacco comment: 4 -5 a day  Substance Use Topics  . Alcohol use: Not Currently    Comment: drinks a beer every few days   . Drug use: Not Currently   Current Outpatient Medications  Medication Sig Dispense Refill  . acetaminophen (TYLENOL) 500 MG tablet Take 1,000 mg by mouth 2 (two) times daily as needed  for mild pain or headache.     . folic acid (FOLVITE) 1 MG tablet Take 1 mg by mouth daily.    . furosemide (LASIX) 20 MG tablet Take 2 tablets (40 mg total) by mouth 2 (two) times daily. 180 tablet 3  . lactulose (CHRONULAC) 10 GM/15ML solution Take 45 mLs (30 g total) by mouth 3 (three) times daily. 236 mL 3  . Multiple Vitamin (MULTIVITAMIN WITH MINERALS) TABS tablet Take 1 tablet by mouth daily.    . nicotine (NICODERM CQ - DOSED IN MG/24 HOURS) 14 mg/24hr patch Place 1 patch (14 mg total) onto the skin daily. (Patient taking differently: Place 14 mg onto the skin daily as needed (smoking cessation). ) 28 patch 0  . rifaximin (XIFAXAN) 550 MG TABS tablet Take 1 tablet (550 mg total) by mouth 2 (two) times daily. (Patient not taking: Reported on 06/23/2019) 60 tablet 5  .  spironolactone (ALDACTONE) 100 MG tablet Take 2 tablets (200 mg total) by mouth daily. (Patient not taking: Reported on 06/23/2019) 30 tablet 2   No current facility-administered medications for this visit.   Facility-Administered Medications Ordered in Other Visits  Medication Dose Route Frequency Provider Last Rate Last Admin  . lactated ringers infusion    Continuous PRN Leonor Liv, CRNA   New Bag at 03/09/19 0800  . lactated ringers infusion    Continuous PRN Leonor Liv, CRNA   New Bag at 03/09/19 [id number]   Allergies  Allergen Reactions  . Contrast Media [Iodinated Diagnostic Agents] Itching and Swelling    Needs 13-hr prep  . Latex Itching, Swelling and Other (See Comments)    No breathing impairment, however  . Sulfa Antibiotics Itching, Swelling and Other (See Comments)    No breathing impairment, however     Review of Systems: All systems reviewed and negative except where noted in HPI.    IR Paracentesis  Result Date: 06/20/2019 INDICATION: Patient history of alcoholic cirrhosis with recurrent ascites presents for diagnostic and therapeutic paracentesis EXAM: ULTRASOUND GUIDED THERAPEUTIC AND DIAGNOSTIC PARACENTESIS MEDICATIONS: None. COMPLICATIONS: None immediate. PROCEDURE: Informed written consent was obtained from the patient after a discussion of the risks, benefits and alternatives to treatment. A timeout was performed prior to the initiation of the procedure. Initial ultrasound scanning demonstrates a large amount of ascites within the right lower abdominal quadrant. The right lower abdomen was prepped and draped in the usual sterile fashion. 1% lidocaine was used for local anesthesia. Following this, a 19 gauge, 7-cm, Yueh catheter was introduced. An ultrasound image was saved for documentation purposes. The paracentesis was performed. The catheter was removed and a dressing was applied. The patient tolerated the procedure well without immediate post procedural  complication. Patient received post-procedure intravenous albumin; see nursing notes for details. FINDINGS: A total of approximately 9.2 L of straw-colored fluid was removed. Samples were sent to the laboratory as requested by the clinical team. IMPRESSION: Successful ultrasound-guided therapeutic and diagnostic paracentesis yielding 9.2 liters of peritoneal fluid. Read by Rushie Nyhan NP Electronically Signed   By: Lucrezia Europe M.D.   On: 06/20/2019 11:37   Lab Results  Component Value Date   WBC 3.4 (L) 04/29/2019   HGB 12.9 04/29/2019   HCT 39.1 04/29/2019   MCV 86.4 04/29/2019   PLT 119.0 (L) 04/29/2019    Lab Results  Component Value Date   CREATININE 0.55 06/22/2019   BUN 6 06/22/2019   NA 140 06/22/2019   K 3.6 06/22/2019   CL  107 06/22/2019   CO2 26 06/22/2019    Lab Results  Component Value Date   ALT 15 04/29/2019   AST 49 (H) 04/29/2019   ALKPHOS 181 (H) 04/29/2019   BILITOT 3.3 (H) 04/29/2019    Lab Results  Component Value Date   INR 1.4 (H) 04/29/2019   INR 1.6 (H) 03/09/2019   INR 1.3 (H) 02/14/2019     Physical Exam: BP 110/78   Pulse 98   Temp 98.3 F (36.8 C)   Ht 5\' 4"  (1.626 m)   Wt 124 lb (56.2 kg)   BMI 21.28 kg/m  Constitutional: Pleasant, female in no acute distress. HEENT: Normocephalic and atraumatic. Conjunctivae are normal. No scleral icterus. Neck supple.  Cardiovascular: Normal rate, regular rhythm.  Pulmonary/chest: Effort normal and breath sounds normal on left, decreased BS on the right side. No wheezing, rales or rhonchi. Abdominal: Soft, distended with ascites nontender. . There are no masses palpable. No hepatomegaly. Extremities: no edema Lymphadenopathy: No cervical adenopathy noted. Neurological: Alert and oriented to person place and time. No asterixis Skin: Skin is warm and dry. No rashes noted. Psychiatric: Normal mood and affect. Behavior is normal.   ASSESSMENT AND PLAN: 53 year old female here for  reassessment of the following:  Alcoholic liver disease ascites / esophageal varices / hepatic encephalopathy / alcohol use disorder - decompensated as above.  She continues to drink alcohol periodically and has been noncompliant with her diuretics.  She has seen both hepatology and interventional radiology, we have considered TIPS but have held off given her noncompliance and continued alcohol use.  I had a very frank discussion with the patient and her daughter about her course over time.  I am very concerned about her continued deterioration, and progressive liver failure if she continues to drink alcohol. She is not a transplant candidate due to alcohol use. The patient has high risk for mortality with continued alcohol use, patient is aware of this.  I emphasized to her that she should never drink any alcohol ever again and she does seem motivated to do abstain today.  I discussed options with her and I do think she needs assistance and counseling if not inpatient rehabilitation.  She is agreeable to see behavioral health with [place] to start the process initially.  Referral placed.  Otherwise we will resume her Lasix at 20 mg twice a day and Aldactone 100 mg daily and recheck her renal function in 2 weeks to ensure stable.  I would like to titrate the doses of her diuretics if she is compliant with taking medications and having her labs drawn.  She is due for [facility] screening with an ultrasound next month.  She is due for repeat EGD in February for surveillance of her varices.  Discussed risk and benefits of endoscopy and anesthesia and she want to proceed.  Colon cancer screening - discussed colon cancer screening with the patient, she is never had a prior colonoscopy.  I offered her a colonoscopy to be done at the same time as her upper endoscopy for screening purposes.  She wanted to proceed  [place] Cellar, MD [facility] Gastroenterology

## 2019-06-30 ENCOUNTER — Telehealth: Payer: Self-pay | Admitting: Gastroenterology

## 2019-07-01 ENCOUNTER — Ambulatory Visit (HOSPITAL_COMMUNITY): Payer: Medicare Other

## 2019-07-01 NOTE — Telephone Encounter (Signed)
Spoke to daughter [name]) yesterday afternoon and cancelled the [place] for 07/01/19 due to anticipated bad weather. She said she will call back next week to reschedule

## 2019-07-05 ENCOUNTER — Telehealth: Payer: Self-pay | Admitting: Gastroenterology

## 2019-07-05 NOTE — Telephone Encounter (Signed)
Patient rescheduled for her [place] at [facility] on 07/12/19 to arrive at 7:45am and be NPO after midnight. Spoke to daughter and gave all the above

## 2019-07-07 ENCOUNTER — Other Ambulatory Visit (INDEPENDENT_AMBULATORY_CARE_PROVIDER_SITE_OTHER): Payer: Medicare Other

## 2019-07-07 DIAGNOSIS — I8501 Esophageal varices with bleeding: Secondary | ICD-10-CM | POA: Diagnosis not present

## 2019-07-07 DIAGNOSIS — F1029 Alcohol dependence with unspecified alcohol-induced disorder: Secondary | ICD-10-CM | POA: Diagnosis not present

## 2019-07-07 DIAGNOSIS — K7031 Alcoholic cirrhosis of liver with ascites: Secondary | ICD-10-CM | POA: Diagnosis not present

## 2019-07-08 ENCOUNTER — Telehealth: Payer: Self-pay

## 2019-07-08 LAB — BASIC METABOLIC PANEL
BUN: 8 mg/dL (ref 6–23)
CO2: 23 mEq/L (ref 19–32)
Calcium: 8.7 mg/dL (ref 8.4–10.5)
Chloride: 104 mEq/L (ref 96–112)
Creatinine, Ser: 0.61 mg/dL (ref 0.40–1.20)
GFR: 123.44 mL/min (ref >60.00)
Glucose, Bld: 96 mg/dL (ref 70–99)
Potassium: 3.6 mEq/L (ref 3.5–5.1)
Sodium: 138 mEq/L (ref 135–145)

## 2019-07-08 NOTE — Telephone Encounter (Signed)
Left message for daughter [name]) to please call back

## 2019-07-12 ENCOUNTER — Ambulatory Visit (HOSPITAL_COMMUNITY)
Admission: RE | Admit: 2019-07-12 | Discharge: 2019-07-12 | Disposition: A | Discharge status: Home / Self Care | Payer: Medicare Other | Source: Ambulatory Visit | Attending: Gastroenterology | Admitting: Gastroenterology

## 2019-07-12 ENCOUNTER — Other Ambulatory Visit: Payer: Self-pay

## 2019-07-12 DIAGNOSIS — I8501 Esophageal varices with bleeding: Secondary | ICD-10-CM | POA: Diagnosis not present

## 2019-07-12 DIAGNOSIS — F1029 Alcohol dependence with unspecified alcohol-induced disorder: Secondary | ICD-10-CM | POA: Diagnosis present

## 2019-07-12 DIAGNOSIS — K7031 Alcoholic cirrhosis of liver with ascites: Secondary | ICD-10-CM | POA: Diagnosis present

## 2019-07-14 ENCOUNTER — Telehealth: Payer: Self-pay | Admitting: Gastroenterology

## 2019-07-14 NOTE — Telephone Encounter (Signed)
Difficult situation. Per our discussion the other day the patient is not taking her medications (diuretics) and continues to drink. This will only get worse.  I recommend she take her diuretics but will only increase the dosing if she is compliant with them and follow up with labs. Otherwise can refer for large volume paracentesis, given albumin if > 5 L removed. Thanks

## 2019-07-14 NOTE — Telephone Encounter (Signed)
Pt requested to be scheduled for a paracentesis.

## 2019-07-15 ENCOUNTER — Other Ambulatory Visit: Payer: Self-pay

## 2019-07-15 DIAGNOSIS — K7031 Alcoholic cirrhosis of liver with ascites: Secondary | ICD-10-CM

## 2019-07-15 NOTE — Telephone Encounter (Signed)
Scheduled IR paracentesis at [facility] on 07/19/19, patient to arrive at 9:45am. Called daughter and left a message with the appt. She had requested. Asked her to call back if this does not work for her.

## 2019-07-19 ENCOUNTER — Other Ambulatory Visit: Payer: Self-pay

## 2019-07-19 ENCOUNTER — Ambulatory Visit (HOSPITAL_COMMUNITY)
Admission: RE | Admit: 2019-07-19 | Discharge: 2019-07-19 | Disposition: A | Discharge status: Home / Self Care | Payer: Medicare Other | Source: Ambulatory Visit | Attending: Gastroenterology | Admitting: Gastroenterology

## 2019-07-19 DIAGNOSIS — K7031 Alcoholic cirrhosis of liver with ascites: Secondary | ICD-10-CM

## 2019-07-19 HISTORY — PX: IR PARACENTESIS: IMG2679

## 2019-07-19 MED ORDER — LIDOCAINE HCL 1 % IJ SOLN
INTRAMUSCULAR | Status: DC | PRN
  Administered 2019-07-19: 10 mL

## 2019-07-19 MED ORDER — ALBUMIN HUMAN 25 % IV SOLN
50.0000 g | Freq: Once | INTRAVENOUS | Status: AC
  Administered 2019-07-19: 50 g via INTRAVENOUS
  Filled 2019-07-19: qty 200

## 2019-07-19 MED ORDER — LIDOCAINE HCL 1 % IJ SOLN
INTRAMUSCULAR | Status: AC
  Filled 2019-07-19: qty 20

## 2019-07-19 MED ORDER — ALBUMIN HUMAN 25 % IV SOLN
INTRAVENOUS | Status: AC
  Filled 2019-07-19: qty 200

## 2019-07-19 NOTE — Procedures (Signed)
PROCEDURE SUMMARY:   Successful image-guided paracentesis from the right lower abdomen.  Yielded 8.4 liters of clear yellow fluid.  No immediate complications.  EBL: zero Patient tolerated well.   Specimen was not sent for labs.  Patient to receive post procedure albumin per IR protocol.   Please see imaging section of Epic for full dictation.  Joaquim Nam PA-C 07/19/2019 10:15 AM

## 2019-07-20 ENCOUNTER — Other Ambulatory Visit: Payer: Self-pay

## 2019-07-21 ENCOUNTER — Ambulatory Visit: Payer: Medicare Other | Admitting: Family Medicine

## 2019-07-27 ENCOUNTER — Encounter (HOSPITAL_COMMUNITY): Payer: Self-pay | Admitting: Gastroenterology

## 2019-07-28 ENCOUNTER — Other Ambulatory Visit: Payer: Self-pay

## 2019-07-28 ENCOUNTER — Other Ambulatory Visit (HOSPITAL_COMMUNITY)
Admission: RE | Admit: 2019-07-28 | Discharge: 2019-07-28 | Disposition: A | Discharge status: Home / Self Care | Payer: Medicare Other | Source: Ambulatory Visit | Attending: Gastroenterology | Admitting: Gastroenterology

## 2019-07-28 DIAGNOSIS — K7031 Alcoholic cirrhosis of liver with ascites: Secondary | ICD-10-CM

## 2019-07-28 DIAGNOSIS — Z20822 Contact with and (suspected) exposure to covid-19: Secondary | ICD-10-CM | POA: Insufficient documentation

## 2019-07-28 DIAGNOSIS — Z01812 Encounter for preprocedural laboratory examination: Secondary | ICD-10-CM | POA: Insufficient documentation

## 2019-07-28 LAB — SARS CORONAVIRUS 2 (TAT 6-24 HRS): SARS Coronavirus 2: NEGATIVE

## 2019-07-31 NOTE — Anesthesia Preprocedure Evaluation (Addendum)
Anesthesia Evaluation  Patient identified by MRN, date of birth, ID band Patient awake    Reviewed: Allergy & Precautions, NPO status , Patient's Chart, lab work & pertinent test results  History of Anesthesia Complications Negative for: history of anesthetic complications  Airway Mallampati: II  TM Distance: >3 FB Neck ROM: Full    Dental  (+) Caps, Dental Advisory Given   Pulmonary Current Smoker and Patient abstained from smoking.,  07/28/2019 SARS coronavirus NEG   breath sounds clear to auscultation       Cardiovascular (-) anginanegative cardio ROS   Rhythm:Regular Rate:Normal     Neuro/Psych  Headaches,    GI/Hepatic GERD  Controlled,(+) Cirrhosis   Esophageal Varices and ascites  substance abuse  alcohol use,   Endo/Other  negative endocrine ROS  Renal/GU negative Renal ROS     Musculoskeletal   Abdominal   Peds  Hematology negative hematology ROS (+)   Anesthesia Other Findings   Reproductive/Obstetrics                            Anesthesia Physical Anesthesia Plan  ASA: III  Anesthesia Plan: MAC   Post-op Pain Management:    Induction:   PONV Risk Score and Plan: 1 and Treatment may vary due to age or medical condition  Airway Management Planned: Nasal Cannula and Natural Airway  Additional Equipment:   Intra-op Plan:   Post-operative Plan:   Informed Consent: I have reviewed the patients History and Physical, chart, labs and discussed the procedure including the risks, benefits and alternatives for the proposed anesthesia with the patient or authorized representative who has indicated his/her understanding and acceptance.     Dental advisory given  Plan Discussed with: CRNA and Surgeon  Anesthesia Plan Comments:        Anesthesia Quick Evaluation

## 2019-08-01 ENCOUNTER — Ambulatory Visit (HOSPITAL_COMMUNITY): Payer: Medicare Other | Admitting: Certified Registered Nurse Anesthetist

## 2019-08-01 ENCOUNTER — Ambulatory Visit (HOSPITAL_COMMUNITY)
Admission: RE | Admit: 2019-08-01 | Discharge: 2019-08-01 | Disposition: A | Discharge status: Home / Self Care | Payer: Medicare Other | Attending: Gastroenterology | Admitting: Gastroenterology

## 2019-08-01 ENCOUNTER — Telehealth: Payer: Self-pay

## 2019-08-01 ENCOUNTER — Other Ambulatory Visit: Payer: Self-pay

## 2019-08-01 ENCOUNTER — Encounter (HOSPITAL_COMMUNITY)
Admission: RE | Disposition: A | Discharge status: Home / Self Care | Payer: Self-pay | Source: Home / Self Care | Attending: Gastroenterology

## 2019-08-01 DIAGNOSIS — Z8719 Personal history of other diseases of the digestive system: Secondary | ICD-10-CM | POA: Insufficient documentation

## 2019-08-01 DIAGNOSIS — K219 Gastro-esophageal reflux disease without esophagitis: Secondary | ICD-10-CM | POA: Diagnosis not present

## 2019-08-01 DIAGNOSIS — K7031 Alcoholic cirrhosis of liver with ascites: Secondary | ICD-10-CM | POA: Diagnosis not present

## 2019-08-01 DIAGNOSIS — F1029 Alcohol dependence with unspecified alcohol-induced disorder: Secondary | ICD-10-CM

## 2019-08-01 DIAGNOSIS — K648 Other hemorrhoids: Secondary | ICD-10-CM | POA: Diagnosis not present

## 2019-08-01 DIAGNOSIS — I8510 Secondary esophageal varices without bleeding: Secondary | ICD-10-CM

## 2019-08-01 DIAGNOSIS — K766 Portal hypertension: Secondary | ICD-10-CM | POA: Insufficient documentation

## 2019-08-01 DIAGNOSIS — F17210 Nicotine dependence, cigarettes, uncomplicated: Secondary | ICD-10-CM | POA: Diagnosis not present

## 2019-08-01 DIAGNOSIS — Z09 Encounter for follow-up examination after completed treatment for conditions other than malignant neoplasm: Secondary | ICD-10-CM | POA: Insufficient documentation

## 2019-08-01 DIAGNOSIS — Z1211 Encounter for screening for malignant neoplasm of colon: Secondary | ICD-10-CM | POA: Diagnosis not present

## 2019-08-01 DIAGNOSIS — K6389 Other specified diseases of intestine: Secondary | ICD-10-CM | POA: Insufficient documentation

## 2019-08-01 DIAGNOSIS — I8501 Esophageal varices with bleeding: Secondary | ICD-10-CM

## 2019-08-01 DIAGNOSIS — K3189 Other diseases of stomach and duodenum: Secondary | ICD-10-CM | POA: Diagnosis not present

## 2019-08-01 HISTORY — PX: ESOPHAGOGASTRODUODENOSCOPY (EGD) WITH PROPOFOL: SHX5813

## 2019-08-01 HISTORY — PX: COLONOSCOPY WITH PROPOFOL: SHX5780

## 2019-08-01 SURGERY — ESOPHAGOGASTRODUODENOSCOPY (EGD) WITH PROPOFOL
Anesthesia: Monitor Anesthesia Care

## 2019-08-01 MED ORDER — PROPOFOL 10 MG/ML IV BOLUS
INTRAVENOUS | Status: AC
  Filled 2019-08-01: qty 20

## 2019-08-01 MED ORDER — PROPOFOL 500 MG/50ML IV EMUL
INTRAVENOUS | Status: DC | PRN
  Administered 2019-08-01: 125 ug/kg/min via INTRAVENOUS

## 2019-08-01 MED ORDER — PROPOFOL 500 MG/50ML IV EMUL
INTRAVENOUS | Status: AC
  Filled 2019-08-01: qty 50

## 2019-08-01 MED ORDER — PHENYLEPHRINE 40 MCG/ML (10ML) SYRINGE FOR IV PUSH (FOR BLOOD PRESSURE SUPPORT)
PREFILLED_SYRINGE | INTRAVENOUS | Status: DC | PRN
  Administered 2019-08-01 (×2): 80 ug via INTRAVENOUS

## 2019-08-01 MED ORDER — SODIUM CHLORIDE 0.9 % IV SOLN: INTRAVENOUS | Status: DC

## 2019-08-01 MED ORDER — LACTATED RINGERS IV SOLN
INTRAVENOUS | Status: DC
  Administered 2019-08-01: 07:00:00 1000 mL via INTRAVENOUS

## 2019-08-01 MED ORDER — PROPOFOL 10 MG/ML IV BOLUS
INTRAVENOUS | Status: DC | PRN
  Administered 2019-08-01 (×3): 10 mg via INTRAVENOUS
  Administered 2019-08-01: 20 mg via INTRAVENOUS

## 2019-08-01 MED ORDER — LIDOCAINE 2% (20 MG/ML) 5 ML SYRINGE
INTRAMUSCULAR | Status: DC | PRN
  Administered 2019-08-01: 40 mg via INTRAVENOUS

## 2019-08-01 SURGICAL SUPPLY — 24 items

## 2019-08-01 NOTE — Anesthesia Procedure Notes (Signed)
Procedure Name: MAC Date/Time: 08/01/2019 7:29 AM Performed by: West Pugh, CRNA Pre-anesthesia Checklist: Patient identified, Emergency Drugs available, Suction available, Patient being monitored and Timeout performed Patient Re-evaluated:Patient Re-evaluated prior to induction Oxygen Delivery Method: Simple face mask Preoxygenation: Pre-oxygenation with 100% oxygen Induction Type: IV induction Dental Injury: Teeth and Oropharynx as per pre-operative assessment

## 2019-08-01 NOTE — Addendum Note (Signed)
Addended by: Roetta Sessions on: 08/01/2019 01:47 PM   Modules accepted: Orders

## 2019-08-01 NOTE — Interval H&P Note (Signed)
History and Physical Interval Note:  08/01/2019 7:23 AM  Katie Holland  has presented today for surgery, with the diagnosis of cirrhosis with ascites, bleeding varices.  The various methods of treatment have been discussed with the patient and family. After consideration of risks, benefits and other options for treatment, the patient has consented to  Procedure(s): ESOPHAGOGASTRODUODENOSCOPY (EGD) WITH PROPOFOL (N/A) COLONOSCOPY WITH PROPOFOL (N/A) as a surgical intervention.  The patient's history has been reviewed, patient examined, no change in status, stable for surgery.  I have reviewed the patient's chart and labs.  Questions were answered to the patient's satisfaction.     [place]

## 2019-08-01 NOTE — H&P (Signed)
HPI :  54 y/o female with decompensated EtOH cirrhosis here for EGD for surveillance of varices and first time colonoscopy for screening purposes. She feels okay today without new complaints. She has required banding of varices in the past.  Past Medical History:  Diagnosis Date  . Alcoholic liver disease ([place])   . Chronic back pain    on disability  . Cirrhosis of liver ([place])   . Esophageal varices ([place])   . GERD (gastroesophageal reflux disease)   . Headache   . Jaundice      Past Surgical History:  Procedure Laterality Date  . BACK SURGERY     L3-L4  . ESOPHAGEAL BANDING  04/25/2018   Procedure: ESOPHAGEAL BANDING;  Surgeon: Yetta Flock, MD;  Location: [facility] ENDOSCOPY;  Service: Gastroenterology;;  . ESOPHAGEAL BANDING  04/27/2018   Procedure: ESOPHAGEAL BANDING;  Surgeon: Milus Banister, MD;  Location: [place];  Service: Endoscopy;;  . ESOPHAGEAL BANDING  06/02/2018   Procedure: ESOPHAGEAL BANDING;  Surgeon: Doran Stabler, MD;  Location: [place];  Service: Gastroenterology;;  . ESOPHAGEAL BANDING N/A 07/07/2018   Procedure: ESOPHAGEAL BANDING;  Surgeon: Yetta Flock, MD;  Location: [facility] ENDOSCOPY;  Service: Gastroenterology;  Laterality: N/A;  . ESOPHAGEAL BANDING  07/27/2018   Procedure: ESOPHAGEAL BANDING;  Surgeon: Yetta Flock, MD;  Location: [facility] ENDOSCOPY;  Service: Gastroenterology;;  . ESOPHAGEAL BANDING  08/30/2018   Procedure: ESOPHAGEAL BANDING;  Surgeon: Yetta Flock, MD;  Location: [facility] ENDOSCOPY;  Service: Gastroenterology;;  . ESOPHAGOGASTRODUODENOSCOPY N/A 04/27/2018   Procedure: ESOPHAGOGASTRODUODENOSCOPY (EGD);  Surgeon: Milus Banister, MD;  Location: [facility] ENDOSCOPY;  Service: Endoscopy;  Laterality: N/A;  . ESOPHAGOGASTRODUODENOSCOPY (EGD) WITH PROPOFOL N/A 04/25/2018   Procedure: ESOPHAGOGASTRODUODENOSCOPY (EGD) WITH PROPOFOL;  Surgeon: Yetta Flock, MD;  Location: [place];  Service: Gastroenterology;   Laterality: N/A;  . ESOPHAGOGASTRODUODENOSCOPY (EGD) WITH PROPOFOL N/A 06/02/2018   Procedure: ESOPHAGOGASTRODUODENOSCOPY (EGD) WITH PROPOFOL;  Surgeon: Doran Stabler, MD;  Location: [place];  Service: Gastroenterology;  Laterality: N/A;  . ESOPHAGOGASTRODUODENOSCOPY (EGD) WITH PROPOFOL N/A 07/07/2018   Procedure: ESOPHAGOGASTRODUODENOSCOPY (EGD) WITH PROPOFOL;  Surgeon: Yetta Flock, MD;  Location: [facility] ENDOSCOPY;  Service: Gastroenterology;  Laterality: N/A;  . ESOPHAGOGASTRODUODENOSCOPY (EGD) WITH PROPOFOL N/A 07/27/2018   Procedure: ESOPHAGOGASTRODUODENOSCOPY (EGD) WITH PROPOFOL;  Surgeon: Yetta Flock, MD;  Location: [facility] ENDOSCOPY;  Service: Gastroenterology;  Laterality: N/A;  . ESOPHAGOGASTRODUODENOSCOPY (EGD) WITH PROPOFOL N/A 08/30/2018   Procedure: ESOPHAGOGASTRODUODENOSCOPY (EGD) WITH PROPOFOL;  Surgeon: Yetta Flock, MD;  Location: [facility] ENDOSCOPY;  Service: Gastroenterology;  Laterality: N/A;  . ESOPHAGOGASTRODUODENOSCOPY (EGD) WITH PROPOFOL N/A 11/08/2018   Procedure: ESOPHAGOGASTRODUODENOSCOPY (EGD) WITH PROPOFOL;  Surgeon: Yetta Flock, MD;  Location: [facility] ENDOSCOPY;  Service: Gastroenterology;  Laterality: N/A;  . ESOPHAGOGASTRODUODENOSCOPY (EGD) WITH PROPOFOL N/A 01/31/2019   Procedure: ESOPHAGOGASTRODUODENOSCOPY (EGD) WITH PROPOFOL;  Surgeon: Yetta Flock, MD;  Location: [facility] ENDOSCOPY;  Service: Gastroenterology;  Laterality: N/A;  . IR PARACENTESIS  04/26/2018  . IR PARACENTESIS  05/18/2018  . IR PARACENTESIS  05/31/2018  . IR PARACENTESIS  06/30/2018  . IR PARACENTESIS  08/05/2018  . IR PARACENTESIS  08/31/2018  . IR PARACENTESIS  10/08/2018  . IR PARACENTESIS  10/28/2018  . IR PARACENTESIS  02/14/2019  . IR PARACENTESIS  03/09/2019  . IR PARACENTESIS  04/05/2019  . IR PARACENTESIS  05/03/2019  . IR PARACENTESIS  06/20/2019  . IR PARACENTESIS  07/19/2019  . IR RADIOLOGIST EVAL & MGMT  12/23/2018  .  IR THORACENTESIS ASP PLEURAL SPACE W/IMG GUIDE   12/10/2018  . IR THORACENTESIS ASP PLEURAL SPACE W/IMG GUIDE  01/21/2019   Family History  Problem Relation Age of Onset  . Colon cancer Neg Hx   . Esophageal cancer Neg Hx   . Pancreatic cancer Neg Hx   . Stomach cancer Neg Hx    Social History   Tobacco Use  . Smoking status: Current Every Day Smoker    Packs/day: 1.00    Years: 20.00    Pack years: 20.00    Types: Cigarettes  . Smokeless tobacco: Never Used  . Tobacco comment: 5 a day  Substance Use Topics  . Alcohol use: Not Currently    Comment: drinks a beer every few days   . Drug use: Not Currently   Current Facility-Administered Medications  Medication Dose Route Frequency Provider Last Rate Last Admin  . 0.9 %  sodium chloride infusion   Intravenous Continuous Elias Bordner, Carlota Raspberry, MD      . lactated ringers infusion   Intravenous Continuous Guliana Weyandt, Carlota Raspberry, MD 10 mL/hr at 08/01/19 0643 1,000 mL at 08/01/19 [id number]   Facility-Administered Medications Ordered in Other Encounters  Medication Dose Route Frequency Provider Last Rate Last Admin  . lactated ringers infusion    Continuous PRN Leonor Liv, CRNA   New Bag at 03/09/19 0800  . lactated ringers infusion    Continuous PRN Leonor Liv, CRNA   New Bag at 03/09/19 [id number]   Allergies  Allergen Reactions  . Contrast Media [Iodinated Diagnostic Agents] Itching and Swelling    Needs 13-hr prep  . Latex Itching, Swelling and Other (See Comments)    No breathing impairment, however  . Sulfa Antibiotics Itching, Swelling and Other (See Comments)    No breathing impairment, however     Review of Systems: All systems reviewed and negative except where noted in HPI.    US Abdomen Limited RUQ  Result Date: 07/12/2019 CLINICAL DATA:  Alcoholic cirrhosis. EXAM: ULTRASOUND ABDOMEN LIMITED RIGHT UPPER QUADRANT COMPARISON:  CT 04/01/2019. FINDINGS: Gallbladder: Tiny gallstones cannot be excluded. Gallbladder wall thickening to 7 mm noted. Although cholecystitis cannot  be excluded, gallbladder wall thickening may be secondary to hypoproteinemic state. Negative Murphy sign. Common bile duct: Diameter: 4 mm Liver: Heterogeneous parenchymal pattern with lobular contour consistent with patient's known cirrhosis. No focal hepatic abnormality identified. Portal vein is patent. Reversal flow noted. Portal vein is patent on color Doppler imaging with normal direction of blood flow towards the liver. Other: Prominent amount of IMPRESSION: 1. Tiny gallstones can not be excluded. Gallbladder wall thickness 7 mm noted. Although cholecystitis cannot be excluded gallbladder wall thickening may be secondary to hypoproteinemic state. 2. Heterogeneous hepatic parenchymal pattern with lobular contour consistent patient's known cirrhosis. No focal hepatic abnormality identified. Portal venous flow reversal noted. 3.  Prominent ascites. Electronically Signed   By: Marcello Moores  Register   On: 07/12/2019 08:12   IR Paracentesis  Result Date: 07/19/2019 INDICATION: Patient with history of alcoholic cirrhosis, recurrent ascites. Request to IR for therapeutic paracentesis with post procedure albumin administration per IR protocol. EXAM: ULTRASOUND GUIDED THERAPEUTIC PARACENTESIS MEDICATIONS: 10 mL 1% lidocaine COMPLICATIONS: None immediate. PROCEDURE: Informed written consent was obtained from the patient after a discussion of the risks, benefits and alternatives to treatment. A timeout was performed prior to the initiation of the procedure. Initial ultrasound scanning demonstrates a large amount of ascites within the right lower abdominal quadrant. The right lower abdomen was prepped and  draped in the usual sterile fashion. 1% lidocaine was used for local anesthesia. Following this, a 19 gauge, 7-cm, Yueh catheter was introduced. An ultrasound image was saved for documentation purposes. The paracentesis was performed. The catheter was removed and a dressing was applied. The patient tolerated the procedure  well without immediate post procedural complication. Patient received post-procedure intravenous albumin; see nursing notes for details. FINDINGS: A total of approximately 8.4 L of clear yellow fluid was removed. IMPRESSION: Successful ultrasound-guided paracentesis yielding 8.4 liters of peritoneal fluid. Read by Candiss Norse, PA-C Electronically Signed   By: Jacqulynn Cadet M.D.   On: 07/19/2019 12:03    Physical Exam: BP (!) 144/98   Pulse 82   Temp 99 F (37.2 C) (Oral)   Resp 18   Ht 5\' 4"  (1.626 m)   Wt 59 kg   SpO2 98%   BMI 22.33 kg/m  Constitutional: Pleasant,well-developed, female in no acute distress. Cardiovascular: Normal rate, regular rhythm.  Pulmonary/chest: Effort normal and breath sounds normal.Abdominal: Soft, some ascites present but soft, nontender.  Extremities: no edema Skin: Skin is warm and dry. No rashes noted. Psychiatric: Normal mood and affect. Behavior is normal.   ASSESSMENT AND PLAN: 54 y/o female with history of EtOH cirrhosis, bleeding esophageal varices s/p multiple bands placed in the past, here for surveillance EGD. Also here for first time colon cancer screening with colonoscopy. Have discussed risks / benefits the patient wishes to proceed.   Leadville Cellar, MD [facility] Gastroenterology

## 2019-08-01 NOTE — Telephone Encounter (Signed)
Okay. Thanks Jan. I should have noted that the referral should state clearly that the patient's daughter should be contacted for scheduling. I don't think the patient will be likely to follow through with this on her own. Thanks

## 2019-08-01 NOTE — Telephone Encounter (Signed)
-----   Message from Yetta Flock, MD sent at 08/01/2019  8:38 AM EST ----- Jan can you check on the status of behavioral health referral for this patient? Was placed it to get her help for alcoholism however she states no one contacted her about the appointment. Can you refer her again if it did not go through? Thanks

## 2019-08-01 NOTE — Anesthesia Postprocedure Evaluation (Signed)
Anesthesia Post Note  Patient: Katie Holland  Procedure(s) Performed: ESOPHAGOGASTRODUODENOSCOPY (EGD) WITH PROPOFOL (N/A ) COLONOSCOPY WITH PROPOFOL (N/A )     Patient location during evaluation: Endoscopy Anesthesia Type: MAC Level of consciousness: awake and alert, patient cooperative and oriented Pain management: pain level controlled Vital Signs Assessment: post-procedure vital signs reviewed and stable Respiratory status: spontaneous breathing, respiratory function stable and nonlabored ventilation Cardiovascular status: blood pressure returned to baseline and stable Postop Assessment: no apparent nausea or vomiting Anesthetic complications: no    Last Vitals:  Vitals:   08/01/19 0821 08/01/19 0830  BP: 121/89 122/90  Pulse: (!) 116 (!) 102  Resp: (!) 22 (!) 23  Temp:    SpO2: 95% 92%    Last Pain:  Vitals:   08/01/19 0830  TempSrc:   PainSc: 0-No pain                 July Linam,E. Janiyah Beery

## 2019-08-01 NOTE — Transfer of Care (Signed)
Immediate Anesthesia Transfer of Care Note  Patient: Katie Holland  Procedure(s) Performed: ESOPHAGOGASTRODUODENOSCOPY (EGD) WITH PROPOFOL (N/A ) COLONOSCOPY WITH PROPOFOL (N/A )  Patient Location: PACU and Endoscopy Unit  Anesthesia Type:MAC  Level of Consciousness: awake, alert , oriented and patient cooperative  Airway & Oxygen Therapy: Patient Spontanous Breathing and Patient connected to face mask oxygen  Post-op Assessment: Report given to RN and Post -op Vital signs reviewed and stable  Post vital signs: Reviewed and stable  Last Vitals:  Vitals Value Taken Time  BP    Temp    Pulse 115 08/01/19 0813  Resp 27 08/01/19 0813  SpO2 100 % 08/01/19 0813  Vitals shown include unvalidated device data.  Last Pain:  Vitals:   08/01/19 0629  TempSrc: Oral  PainSc: 0-No pain         Complications: No apparent anesthesia complications

## 2019-08-01 NOTE — Op Note (Signed)
[facility] Patient Name: Katie Holland Procedure Date: 08/01/2019 MRN: [id number] Attending MD: Carlota Raspberry. Havery Moros , MD Date of Birth: March 13, 1966 CSN: [id number] Age: 54 Admit Type: Outpatient Procedure:                Colonoscopy Indications:              Screening for colorectal malignant neoplasm, This                            is the patient's first colonoscopy Providers:                Carlota Raspberry. Havery Moros, MD, Cleda Daub, RN, Lina Sar, Technician, Christell Faith, CRNA Referring MD:              Medicines:                Monitored Anesthesia Care Complications:            No immediate complications. Estimated blood loss:                            Minimal. Estimated Blood Loss:     Estimated blood loss was minimal. Procedure:                Pre-Anesthesia Assessment:                           - Prior to the procedure, a History and Physical                            was performed, and patient medications and                            allergies were reviewed. The patient's tolerance of                            previous anesthesia was also reviewed. The risks                            and benefits of the procedure and the sedation                            options and risks were discussed with the patient.                            All questions were answered, and informed consent                            was obtained. Prior Anticoagulants: The patient has                            taken no previous anticoagulant or antiplatelet  agents. ASA Grade Assessment: III - A patient with                            severe systemic disease. After reviewing the risks                            and benefits, the patient was deemed in                            satisfactory condition to undergo the procedure.                           After obtaining informed consent, the colonoscope    was passed under direct vision. Throughout the                            procedure, the patient's blood pressure, pulse, and                            oxygen saturations were monitored continuously. The                            [id number]-H190DL ([id number] Olympus pediatric colonscope                            was introduced through the anus and advanced to the                            the terminal ileum, with identification of the                            appendiceal orifice and IC valve. The colonoscopy                            was performed without difficulty. The patient                            tolerated the procedure well. The quality of the                            bowel preparation was fair. The terminal ileum,                            ileocecal valve, appendiceal orifice, and rectum                            were photographed. Scope In: 7:49:57 AM Scope Out: 8:05:06 AM Scope Withdrawal Time: 0 hours 11 minutes 15 seconds  Total Procedure Duration: 0 hours 15 minutes 9 seconds  Findings:      Hemorrhoids were found on perianal exam.      The terminal ileum appeared normal.      The colon was diffusely congested / edematous due to portal hypertension       colon.      A moderate amount  of semi-solid stool was found in the proximal       descending colon, at the splenic flexure and in the proximal transverse       colon, making visualization difficult in these areas. Lavage of the       areas was performed using copious amounts of sterile water, resulting in       incomplete clearance with fair visualization. The colonoscope clogged in       these areas with suctioning - no obvious polyps or mass lesions but       certain areas were difficult to visualize due to stool burden.      Internal hemorrhoids were found during retroflexion.      The exam was otherwise without abnormality. No polyps seen Impression:               - Preparation of the colon was fair - most of the                             colon was well visualized - a small portion of the                            transverse colon, and then the splenic flexure /                            proximal descending colon had some residual stool                            that could not be cleared but nothing obvious noted                            in these areas.                           - Hemorrhoids found on perianal exam.                           - The examined portion of the ileum was normal.                           - Diffuse colonic congestion / edema in the entire                            examined colon from portal hypertension.                           - Internal hemorrhoids.                           - The examination was otherwise normal. Moderate Sedation:      No moderate sedation, case performed with MAC Recommendation:           - Patient has a contact number available for                            emergencies. The signs and symptoms of potential  delayed complications were discussed with the                            patient. Return to normal activities tomorrow.                            Written discharge instructions were provided to the                            patient.                           - Resume previous diet.                           - Continue present medications.                           - Will discuss options / timing of next colon                            cancer screening given patient and daughter given                            fair prep on this exam in some areas. Procedure Code(s):        --- Professional ---                           [phone], Colonoscopy, flexible; diagnostic, including                            collection of specimen(s) by brushing or washing,                            when performed (separate procedure) Diagnosis Code(s):        --- Professional ---                           Z12.11, Encounter for  screening for malignant                            neoplasm of colon                           K64.8, Other hemorrhoids                           K63.89, Other specified diseases of intestine CPT copyright 2019 American Medical Association. All rights reserved. The codes documented in this report are preliminary and upon coder review may  be revised to meet current compliance requirements. Remo Lipps P. Ludie Pavlik, MD 08/01/2019 8:15:32 AM This report has been signed electronically. Number of Addenda: 0

## 2019-08-01 NOTE — Telephone Encounter (Signed)
referral to [facility] was sent on  06-23-19. They attempted to contact the patient but were not successful. A second referral has been sent today.

## 2019-08-01 NOTE — Op Note (Signed)
[facility] Patient Name: Katie Holland Procedure Date: 08/01/2019 MRN: [id number] Attending MD: Carlota Raspberry. Havery Moros , MD Date of Birth: [birth year] CSN: [id number] Age: 54 Admit Type: Outpatient Procedure:                Upper GI endoscopy Indications:              Follow-up of esophageal varices - history of                            bleeding with total of 25 bands placed since Nov                            2019, no varices amenable to banding on the last 2                            exams (last done 01/2019). Not on nonselective beta                            blockade due to refractory ascites Providers:                Remo Lipps P. Havery Moros, MD, Lina Sar,                            Technician, Christell Faith, CRNA, Cleda Daub, RN Referring MD:              Medicines:                Monitored Anesthesia Care Complications:            No immediate complications. Estimated blood loss:                            Minimal. Estimated Blood Loss:     Estimated blood loss was minimal. Procedure:                Pre-Anesthesia Assessment:                           - Prior to the procedure, a History and Physical                            was performed, and patient medications and                            allergies were reviewed. The patient's tolerance of                            previous anesthesia was also reviewed. The risks                            and benefits of the procedure and the sedation                            options and risks were discussed with the patient.  All questions were answered, and informed consent                            was obtained. Prior Anticoagulants: The patient has                            taken no previous anticoagulant or antiplatelet                            agents. ASA Grade Assessment: III - A patient with                            severe systemic disease. After reviewing the risks                   and benefits, the patient was deemed in                            satisfactory condition to undergo the procedure.                           After obtaining informed consent, the endoscope was                            passed under direct vision. Throughout the                            procedure, the patient's blood pressure, pulse, and                            oxygen saturations were monitored continuously. The                            [id number]-[id number] ([id number]) Olympus gastroscope was                            introduced through the mouth, and advanced to the                            second part of duodenum. The upper GI endoscopy was                            accomplished without difficulty. The patient                            tolerated the procedure well. Scope In: Scope Out: Findings:      Esophagogastric landmarks were identified: the Z-line was found at 36       cm, the gastroesophageal junction was found at 36 cm and the upper       extent of the gastric folds was found at 36 cm from the incisors.      There was extensive scarring in the distal esophagus secondary to prior       banding, but no obvious varices noted today. The exam of the esophagus       was otherwise  normal.      Diffuse severely congested mucosa was found in the gastric antrum       secondary to portal hypertension..      The exam of the stomach was otherwise normal. No gastric varices.      The duodenal bulb and second portion of the duodenum were normal. Impression:               - Esophagogastric landmarks identified.                           - Extensive scarring from prior banding in the                            distal esophagus, no varices obvious today that are                            amenable for banding                           - Normal esophagus otherwise                           - Congestive changes secondary to portal                            hypertension.                            - Normal stomach otherwise                           - Normal duodenal bulb and second portion of the                            duodenum. Moderate Sedation:      No moderate sedation, case performed with MAC Recommendation:           - Patient has a contact number available for                            emergencies. The signs and symptoms of potential                            delayed complications were discussed with the                            patient. Return to normal activities tomorrow.                            Written discharge instructions were provided to the                            patient.                           - Resume previous diet.                           -  Continue present medications.                           - Repeat upper endoscopy in 6-12 months for                            surveillance.                           - Alcohol abstinence Procedure Code(s):        --- Professional ---                           [phone], Esophagogastroduodenoscopy, flexible,                            transoral; diagnostic, including collection of                            specimen(s) by brushing or washing, when performed                            (separate procedure) Diagnosis Code(s):        --- Professional ---                           K31.89, Other diseases of stomach and duodenum                           I85.00, Esophageal varices without bleeding CPT copyright 2019 American Medical Association. All rights reserved. The codes documented in this report are preliminary and upon coder review may  be revised to meet current compliance requirements. Remo Lipps P. Chelsa Stout, MD 08/01/2019 8:24:15 AM This report has been signed electronically. Number of Addenda: 0

## 2019-08-01 NOTE — Discharge Instructions (Signed)
YOU HAD AN ENDOSCOPIC PROCEDURE TODAY: Refer to the procedure report and other information in the discharge instructions given to you for any specific questions about what was found during the examination. If this information does not answer your questions, please call [place] office at [phone] to clarify.  ° °YOU SHOULD EXPECT: Some feelings of bloating in the abdomen. Passage of more gas than usual. Walking can help get rid of the air that was put into your GI tract during the procedure and reduce the bloating. If you had a lower endoscopy (such as a colonoscopy or flexible sigmoidoscopy) you may notice spotting of blood in your stool or on the toilet paper. Some abdominal soreness may be present for a day or two, also. ° °DIET: Your first meal following the procedure should be a light meal and then it is ok to progress to your normal diet. A half-sandwich or bowl of soup is an example of a good first meal. Heavy or fried foods are harder to digest and may make you feel nauseous or bloated. Drink plenty of fluids but you should avoid alcoholic beverages for 24 hours. If you had a esophageal dilation, please see attached instructions for diet.   ° °ACTIVITY: Your care partner should take you home directly after the procedure. You should plan to take it easy, moving slowly for the rest of the day. You can resume normal activity the day after the procedure however YOU SHOULD NOT DRIVE, use power tools, machinery or perform tasks that involve climbing or major physical exertion for 24 hours (because of the sedation medicines used during the test).  ° °SYMPTOMS TO REPORT IMMEDIATELY: °A gastroenterologist can be reached at any hour. Please call [phone]  for any of the following symptoms:  °Following lower endoscopy (colonoscopy, flexible sigmoidoscopy) °Excessive amounts of blood in the stool  °Significant tenderness, worsening of abdominal pains  °Swelling of the abdomen that is new, acute  °Fever of 100° or  higher  °Following upper endoscopy (EGD, EUS, ERCP, esophageal dilation) °Vomiting of blood or coffee ground material  °New, significant abdominal pain  °New, significant chest pain or pain under the shoulder blades  °Painful or persistently difficult swallowing  °New shortness of breath  °Black, tarry-looking or red, bloody stools ° °FOLLOW UP:  °If any biopsies were taken you will be contacted by phone or by letter within the next 1-3 weeks. Call [phone]  if you have not heard about the biopsies in 3 weeks.  °Please also call with any specific questions about appointments or follow up tests. ° °

## 2019-08-01 NOTE — Telephone Encounter (Signed)
New referral to [facility] for alcoholism placed with contact information for pt's daughter, [name] for scheduling.

## 2019-08-02 ENCOUNTER — Encounter: Payer: Self-pay | Admitting: *Deleted

## 2019-08-02 ENCOUNTER — Telehealth (HOSPITAL_COMMUNITY): Payer: Self-pay | Admitting: Licensed Clinical Social Worker

## 2019-08-11 ENCOUNTER — Ambulatory Visit: Payer: Medicare Other

## 2019-08-14 NOTE — Telephone Encounter (Signed)
Client to be scheduled.

## 2019-08-15 ENCOUNTER — Telehealth: Payer: Self-pay | Admitting: Gastroenterology

## 2019-08-16 ENCOUNTER — Other Ambulatory Visit: Payer: Self-pay

## 2019-08-16 DIAGNOSIS — K7031 Alcoholic cirrhosis of liver with ascites: Secondary | ICD-10-CM

## 2019-08-16 NOTE — Telephone Encounter (Signed)
Scheduled paracentesis at [facility] on 08/19/19, patient to arrive at 1:45pm. Found out [facility] does not treat alcoholism. Changed the referral to [facility]. Spoke to [place] at [phone] and she will call the daughter [name]) to set-up appt.

## 2019-08-16 NOTE — Telephone Encounter (Signed)
Can order large volume paracentesis, give albumin if > 5 L removed.  Difficult situation, she continues to drink and does not take her diuretics reliably - she has been counseled on this extensively. If she needs another paracentesis okay to proceed with it, would send cell count to ensure no SBP. Thanks

## 2019-08-16 NOTE — Telephone Encounter (Signed)
Thanks for your help with this.

## 2019-08-16 NOTE — Telephone Encounter (Signed)
Called patient's daughter back, she is requesting a paracentesis for this patient. Please advise

## 2019-08-18 ENCOUNTER — Other Ambulatory Visit: Payer: Self-pay

## 2019-08-18 ENCOUNTER — Emergency Department (HOSPITAL_COMMUNITY)
Admission: EM | Admit: 2019-08-18 | Discharge: 2019-08-19 | Disposition: A | Discharge status: Home / Self Care | Payer: Medicare Other | Attending: Emergency Medicine | Admitting: Emergency Medicine

## 2019-08-18 ENCOUNTER — Encounter (HOSPITAL_COMMUNITY): Payer: Self-pay | Admitting: Emergency Medicine

## 2019-08-18 DIAGNOSIS — E876 Hypokalemia: Secondary | ICD-10-CM | POA: Diagnosis not present

## 2019-08-18 DIAGNOSIS — Z91040 Latex allergy status: Secondary | ICD-10-CM | POA: Insufficient documentation

## 2019-08-18 DIAGNOSIS — F17210 Nicotine dependence, cigarettes, uncomplicated: Secondary | ICD-10-CM | POA: Insufficient documentation

## 2019-08-18 DIAGNOSIS — K7031 Alcoholic cirrhosis of liver with ascites: Secondary | ICD-10-CM | POA: Insufficient documentation

## 2019-08-18 DIAGNOSIS — Z79899 Other long term (current) drug therapy: Secondary | ICD-10-CM | POA: Diagnosis not present

## 2019-08-18 LAB — COMPREHENSIVE METABOLIC PANEL
ALT: 27 U/L (ref 0–44)
AST: 104 U/L — ABNORMAL HIGH (ref 15–41)
Albumin: 2.6 g/dL — ABNORMAL LOW (ref 3.5–5.0)
Alkaline Phosphatase: 190 U/L — ABNORMAL HIGH (ref 38–126)
Anion gap: 13 (ref 5–15)
BUN: <5 mg/dL — ABNORMAL LOW (ref 6–20)
CO2: 23 mmol/L (ref 22–32)
Calcium: 8.7 mg/dL — ABNORMAL LOW (ref 8.9–10.3)
Chloride: 104 mmol/L (ref 98–111)
Creatinine, Ser: 0.6 mg/dL (ref 0.44–1.00)
GFR calc Af Amer: >60 mL/min (ref >60)
GFR calc non Af Amer: >60 mL/min (ref >60)
Glucose, Bld: 100 mg/dL — ABNORMAL HIGH (ref 70–99)
Potassium: 3 mmol/L — ABNORMAL LOW (ref 3.5–5.1)
Sodium: 140 mmol/L (ref 135–145)
Total Bilirubin: 2.9 mg/dL — ABNORMAL HIGH (ref 0.3–1.2)
Total Protein: 7.5 g/dL (ref 6.5–8.1)

## 2019-08-18 LAB — CBC WITH DIFFERENTIAL/PLATELET
Abs Immature Granulocytes: 0.01 10*3/uL (ref 0.00–0.07)
Basophils Absolute: 0 10*3/uL (ref 0.0–0.1)
Basophils Relative: 1 %
Eosinophils Absolute: 0.1 10*3/uL (ref 0.0–0.5)
Eosinophils Relative: 3 %
HCT: 36.2 % (ref 36.0–46.0)
Hemoglobin: 12.1 g/dL (ref 12.0–15.0)
Immature Granulocytes: 0 %
Lymphocytes Relative: 40 %
Lymphs Abs: 1.5 10*3/uL (ref 0.7–4.0)
MCH: 27.8 pg (ref 26.0–34.0)
MCHC: 33.4 g/dL (ref 30.0–36.0)
MCV: 83.2 fL (ref 80.0–100.0)
Monocytes Absolute: 0.6 10*3/uL (ref 0.1–1.0)
Monocytes Relative: 16 %
Neutro Abs: 1.5 10*3/uL — ABNORMAL LOW (ref 1.7–7.7)
Neutrophils Relative %: 40 %
Platelets: UNDETERMINED 10*3/uL (ref 150–400)
RBC: 4.35 MIL/uL (ref 3.87–5.11)
RDW: 21 % — ABNORMAL HIGH (ref 11.5–15.5)
WBC: 3.9 10*3/uL — ABNORMAL LOW (ref 4.0–10.5)
nRBC: 0 % (ref 0.0–0.2)

## 2019-08-18 LAB — URINALYSIS, ROUTINE W REFLEX MICROSCOPIC
Bilirubin Urine: NEGATIVE
Glucose, UA: NEGATIVE mg/dL
Hgb urine dipstick: NEGATIVE
Ketones, ur: NEGATIVE mg/dL
Leukocytes,Ua: NEGATIVE
Nitrite: NEGATIVE
Protein, ur: NEGATIVE mg/dL
Specific Gravity, Urine: 1.026 (ref 1.005–1.030)
pH: 5 (ref 5.0–8.0)

## 2019-08-18 NOTE — ED Triage Notes (Signed)
Patient reports increasing ascites today with abdominal distention , history of liver disease/alcoholic cirrhosis , respirations unlabored , denies fever or chills .

## 2019-08-19 ENCOUNTER — Ambulatory Visit (HOSPITAL_COMMUNITY)
Admission: RE | Admit: 2019-08-19 | Discharge: 2019-08-19 | Disposition: A | Discharge status: Home / Self Care | Payer: Medicare Other | Source: Ambulatory Visit | Attending: Gastroenterology | Admitting: Gastroenterology

## 2019-08-19 DIAGNOSIS — K7031 Alcoholic cirrhosis of liver with ascites: Secondary | ICD-10-CM | POA: Insufficient documentation

## 2019-08-19 DIAGNOSIS — R188 Other ascites: Secondary | ICD-10-CM | POA: Diagnosis not present

## 2019-08-19 HISTORY — PX: IR PARACENTESIS: IMG2679

## 2019-08-19 LAB — BODY FLUID CELL COUNT WITH DIFFERENTIAL
Eos, Fluid: 0 %
Lymphs, Fluid: 41 %
Monocyte-Macrophage-Serous Fluid: 52 % (ref 50–90)
Neutrophil Count, Fluid: 7 % (ref 0–25)
Total Nucleated Cell Count, Fluid: 33 cu mm (ref 0–1000)

## 2019-08-19 MED ORDER — ALBUMIN HUMAN 25 % IV SOLN
50.0000 g | Freq: Once | INTRAVENOUS | Status: AC
  Filled 2019-08-19: qty 200

## 2019-08-19 MED ORDER — POTASSIUM CHLORIDE CRYS ER 20 MEQ PO TBCR: 20.0000 meq | EXTENDED_RELEASE_TABLET | Freq: Two times a day (BID) | ORAL | 0 refills | Status: DC

## 2019-08-19 MED ORDER — ALBUMIN HUMAN 25 % IV SOLN
INTRAVENOUS | Status: AC
  Filled 2019-08-19: qty 100

## 2019-08-19 MED ORDER — POTASSIUM CHLORIDE CRYS ER 20 MEQ PO TBCR
40.0000 meq | EXTENDED_RELEASE_TABLET | Freq: Once | ORAL | Status: AC
  Administered 2019-08-19: 40 meq via ORAL
  Filled 2019-08-19: qty 2

## 2019-08-19 MED ORDER — ALBUMIN HUMAN 25 % IV SOLN
INTRAVENOUS | Status: AC
  Administered 2019-08-19: 15:00:00 50 g via INTRAVENOUS
  Filled 2019-08-19: qty 100

## 2019-08-19 MED ORDER — LIDOCAINE HCL (PF) 1 % IJ SOLN
INTRAMUSCULAR | Status: DC | PRN
  Administered 2019-08-19: 10 mL

## 2019-08-19 MED ORDER — LIDOCAINE HCL 1 % IJ SOLN
INTRAMUSCULAR | Status: AC
  Filled 2019-08-19: qty 20

## 2019-08-19 NOTE — Procedures (Signed)
PROCEDURE SUMMARY:  Successful US guided paracentesis from right lateral abdomen.  Yielded 8.6 liters of yellow fluid.  No immediate complications.  Pt tolerated well.   Specimen was sent for labs.  EBL < 49mL  Docia Barrier PA-C 08/19/2019 3:11 PM

## 2019-08-19 NOTE — Discharge Instructions (Signed)
Please keep your appointment later today to have the fluid drained from your abdomen. Talk with your doctor about having the procedure more frequently.

## 2019-08-19 NOTE — ED Provider Notes (Signed)
[facility] EMERGENCY DEPARTMENT Provider Note   CSN: [id number] Arrival date & time: 08/18/19  2023   History Chief Complaint  Patient presents with  . Ascites    Liver Cirrhosis    Katie Holland is a 54 y.o. female.  The history is provided by the patient.  She has history of alcoholic cirrhosis complicated by esophageal varices and ascites and comes in with progressive abdominal swelling.  She states that it is uncomfortable.  She is scheduled for paracentesis on the morning of February 26.  She denies fever, nausea, vomiting.  Her abdomen is sore from being tight but no true abdominal pain.  Past Medical History:  Diagnosis Date  . Alcoholic liver disease ([place])   . Chronic back pain    on disability  . Cirrhosis of liver ([place])   . Esophageal varices ([place])   . GERD (gastroesophageal reflux disease)   . Headache   . Jaundice     Patient Active Problem List   Diagnosis Date Noted  . Colon cancer screening   . Encephalopathy, hepatic ([place]) 05/06/2019  . Ascites 01/20/2019  . Pleural effusion 01/20/2019  . UTI (urinary tract infection) 01/20/2019  . Acute upper GI bleeding 06/02/2018  . Alcohol dependence syndrome ([place]) 06/02/2018  . Decompensated hepatic cirrhosis ([place])   . Esophageal varices ([place])   . Alcoholic liver disease ([place])   . Protein-calorie malnutrition, severe 04/26/2018  . Bleeding esophageal varices ([place])   . Abdominal pain 04/24/2018  . Hematemesis 04/24/2018  . Hematochezia 04/24/2018  . Alcohol abuse 04/24/2018  . Hyponatremia 04/24/2018  . Hypokalemia 04/24/2018  . Metabolic acidosis, normal anion gap (NAG) 04/24/2018  . History of GI bleed 10/26/2017  . Alcoholic cirrhosis of liver with ascites ([place]) 10/26/2017  . Tobacco dependence 10/26/2017    Past Surgical History:  Procedure Laterality Date  . BACK SURGERY     L3-L4  . COLONOSCOPY WITH PROPOFOL N/A 08/01/2019   Procedure: COLONOSCOPY WITH PROPOFOL;  Surgeon:  Yetta Flock, MD;  Location: [facility] ENDOSCOPY;  Service: Gastroenterology;  Laterality: N/A;  . ESOPHAGEAL BANDING  04/25/2018   Procedure: ESOPHAGEAL BANDING;  Surgeon: Yetta Flock, MD;  Location: [facility] ENDOSCOPY;  Service: Gastroenterology;;  . ESOPHAGEAL BANDING  04/27/2018   Procedure: ESOPHAGEAL BANDING;  Surgeon: Milus Banister, MD;  Location: [place];  Service: Endoscopy;;  . ESOPHAGEAL BANDING  06/02/2018   Procedure: ESOPHAGEAL BANDING;  Surgeon: Doran Stabler, MD;  Location: [place];  Service: Gastroenterology;;  . ESOPHAGEAL BANDING N/A 07/07/2018   Procedure: ESOPHAGEAL BANDING;  Surgeon: Yetta Flock, MD;  Location: [facility];  Service: Gastroenterology;  Laterality: N/A;  . ESOPHAGEAL BANDING  07/27/2018   Procedure: ESOPHAGEAL BANDING;  Surgeon: Yetta Flock, MD;  Location: [facility];  Service: Gastroenterology;;  . ESOPHAGEAL BANDING  08/30/2018   Procedure: ESOPHAGEAL BANDING;  Surgeon: Yetta Flock, MD;  Location: [facility];  Service: Gastroenterology;;  . ESOPHAGOGASTRODUODENOSCOPY N/A 04/27/2018   Procedure: ESOPHAGOGASTRODUODENOSCOPY (EGD);  Surgeon: Milus Banister, MD;  Location: [facility] ENDOSCOPY;  Service: Endoscopy;  Laterality: N/A;  . ESOPHAGOGASTRODUODENOSCOPY (EGD) WITH PROPOFOL N/A 04/25/2018   Procedure: ESOPHAGOGASTRODUODENOSCOPY (EGD) WITH PROPOFOL;  Surgeon: Yetta Flock, MD;  Location: [place];  Service: Gastroenterology;  Laterality: N/A;  . ESOPHAGOGASTRODUODENOSCOPY (EGD) WITH PROPOFOL N/A 06/02/2018   Procedure: ESOPHAGOGASTRODUODENOSCOPY (EGD) WITH PROPOFOL;  Surgeon: Doran Stabler, MD;  Location: [place];  Service: Gastroenterology;  Laterality: N/A;  . ESOPHAGOGASTRODUODENOSCOPY (EGD) WITH PROPOFOL  N/A 07/07/2018   Procedure: ESOPHAGOGASTRODUODENOSCOPY (EGD) WITH PROPOFOL;  Surgeon: Yetta Flock, MD;  Location: [facility];  Service: Gastroenterology;  Laterality: N/A;  .  ESOPHAGOGASTRODUODENOSCOPY (EGD) WITH PROPOFOL N/A 07/27/2018   Procedure: ESOPHAGOGASTRODUODENOSCOPY (EGD) WITH PROPOFOL;  Surgeon: Yetta Flock, MD;  Location: [facility];  Service: Gastroenterology;  Laterality: N/A;  . ESOPHAGOGASTRODUODENOSCOPY (EGD) WITH PROPOFOL N/A 08/30/2018   Procedure: ESOPHAGOGASTRODUODENOSCOPY (EGD) WITH PROPOFOL;  Surgeon: Yetta Flock, MD;  Location: [facility];  Service: Gastroenterology;  Laterality: N/A;  . ESOPHAGOGASTRODUODENOSCOPY (EGD) WITH PROPOFOL N/A 11/08/2018   Procedure: ESOPHAGOGASTRODUODENOSCOPY (EGD) WITH PROPOFOL;  Surgeon: Yetta Flock, MD;  Location: [facility];  Service: Gastroenterology;  Laterality: N/A;  . ESOPHAGOGASTRODUODENOSCOPY (EGD) WITH PROPOFOL N/A 01/31/2019   Procedure: ESOPHAGOGASTRODUODENOSCOPY (EGD) WITH PROPOFOL;  Surgeon: Yetta Flock, MD;  Location: [facility];  Service: Gastroenterology;  Laterality: N/A;  . ESOPHAGOGASTRODUODENOSCOPY (EGD) WITH PROPOFOL N/A 08/01/2019   Procedure: ESOPHAGOGASTRODUODENOSCOPY (EGD) WITH PROPOFOL;  Surgeon: Yetta Flock, MD;  Location: [facility];  Service: Gastroenterology;  Laterality: N/A;  . IR PARACENTESIS  04/26/2018  . IR PARACENTESIS  05/18/2018  . IR PARACENTESIS  05/31/2018  . IR PARACENTESIS  06/30/2018  . IR PARACENTESIS  08/05/2018  . IR PARACENTESIS  08/31/2018  . IR PARACENTESIS  10/08/2018  . IR PARACENTESIS  10/28/2018  . IR PARACENTESIS  02/14/2019  . IR PARACENTESIS  03/09/2019  . IR PARACENTESIS  04/05/2019  . IR PARACENTESIS  05/03/2019  . IR PARACENTESIS  06/20/2019  . IR PARACENTESIS  07/19/2019  . IR RADIOLOGIST EVAL & MGMT  12/23/2018  . IR THORACENTESIS ASP PLEURAL SPACE W/IMG GUIDE  12/10/2018  . IR THORACENTESIS ASP PLEURAL SPACE W/IMG GUIDE  01/21/2019     OB History   No obstetric history on file.     Family History  Problem Relation Age of Onset  . Colon cancer Neg Hx   . Esophageal cancer Neg Hx   . Pancreatic cancer  Neg Hx   . Stomach cancer Neg Hx     Social History   Tobacco Use  . Smoking status: Current Every Day Smoker    Packs/day: 1.00    Years: 20.00    Pack years: 20.00    Types: Cigarettes  . Smokeless tobacco: Never Used  . Tobacco comment: 5 a day  Substance Use Topics  . Alcohol use: Yes    Comment: drinks a beer every few days   . Drug use: Not Currently    Home Medications Prior to Admission medications   Medication Sig Start Date End Date Taking? Authorizing Provider  furosemide (LASIX) 20 MG tablet Take 1 tablet (20 mg total) by mouth 2 (two) times daily. Patient not taking: Reported on 07/18/2019 06/23/19   Yetta Flock, MD  lactulose (CHRONULAC) 10 GM/15ML solution Take 45 mLs (30 g total) by mouth 3 (three) times daily. 04/27/19   Armbruster, Carlota Raspberry, MD  Multiple Vitamin (MULTIVITAMIN WITH MINERALS) TABS tablet Take 1 tablet by mouth.     [provider]  nicotine (NICODERM CQ - DOSED IN MG/24 HOURS) 14 mg/24hr patch Place 1 patch (14 mg total) onto the skin daily. Patient not taking: Reported on 07/18/2019 01/23/19   Deatra James, MD  potassium chloride SA (KLOR-CON) 20 MEQ tablet Take 1 tablet (20 mEq total) by mouth 2 (two) times daily. [id number]   Delora Fuel, MD  rifaximin (XIFAXAN) 550 MG TABS tablet Take 1 tablet (550 mg total) by mouth 2 (  two) times daily. Patient not taking: Reported on 07/18/2019 06/23/19   Yetta Flock, MD  spironolactone (ALDACTONE) 100 MG tablet Take 1 tablet (100 mg total) by mouth daily. Patient not taking: Reported on 07/18/2019 06/23/19   Yetta Flock, MD    Allergies    Contrast media [iodinated diagnostic agents], Latex, and Sulfa antibiotics  Review of Systems   Review of Systems  All other systems reviewed and are negative.   Physical Exam Updated Vital Signs BP 132/88   Pulse 100   Temp 97.9 F (36.6 C)   Resp 19   Ht 5\' 4"  (1.626 m)   Wt 65 kg   SpO2 98%   BMI 24.60 kg/m    Physical Exam Vitals and nursing note reviewed.   54 year old female, resting comfortably and in no acute distress. Vital signs are normal. Oxygen saturation is 98%, which is normal. Head is normocephalic and atraumatic. PERRLA, EOMI. Oropharynx is clear. Neck is nontender and supple without adenopathy or JVD. Back is nontender and there is no CVA tenderness. Lungs are clear without rales, wheezes, or rhonchi. Chest is nontender. Heart has regular rate and rhythm without murmur. Abdomen has massive ascites with positive fluid wave.  Caput medusa is present.  There is a moderately large umbilical hernia present which is easily reducible.  Abdomen is soft and nontender.  Peristalsis is normoactive. Extremities have no cyanosis or edema, full range of motion is present. Skin is warm and dry without rash. Neurologic: Mental status is normal, cranial nerves are intact, there are no motor or sensory deficits.  ED Results / Procedures / Treatments   Labs (all labs ordered are listed, but only abnormal results are displayed) Labs Reviewed  CBC WITH DIFFERENTIAL/PLATELET - Abnormal; Notable for the following components:      Result Value   WBC 3.9 (*)    RDW 21.0 (*)    Neutro Abs 1.5 (*)    All other components within normal limits  COMPREHENSIVE METABOLIC PANEL - Abnormal; Notable for the following components:   Potassium 3.0 (*)    Glucose, Bld 100 (*)    BUN <5 (*)    Calcium 8.7 (*)    Albumin 2.6 (*)    AST 104 (*)    Alkaline Phosphatase 190 (*)    Total Bilirubin 2.9 (*)    All other components within normal limits  URINALYSIS, ROUTINE W REFLEX MICROSCOPIC - Abnormal; Notable for the following components:   Color, Urine AMBER (*)    APPearance HAZY (*)    All other components within normal limits   Procedures Procedures   Medications Ordered in ED Medications  potassium chloride SA (KLOR-CON) CR tablet 40 mEq (has no administration in time range)    ED Course  I  have reviewed the triage vital signs and the nursing notes.  Pertinent lab results that were available during my care of the patient were reviewed by me and considered in my medical decision making (see chart for details).  MDM Rules/Calculators/A&P Alcoholic cirrhosis with massive ascites.  Old records are reviewed, and she is getting paracentesis monthly and is scheduled for paracentesis in the morning.  No evidence of spontaneous bacterial peritonitis on exam.  Labs are reassuring.  Patient is advised to come for her scheduled paracentesis through interventional radiology, no indication for emergent paracentesis.  I have recommended that she talk with her primary care provider about increasing the frequency of paracentesis.  Final Clinical Impression(s) / ED  Diagnoses Final diagnoses:  Ascites due to alcoholic cirrhosis ([place])  Hypokalemia    Rx / DC Orders ED Discharge Orders         Ordered    potassium chloride SA (KLOR-CON) 20 MEQ tablet  2 times daily     08/19/19 [id number]           Delora Fuel, MD [id number] [phone]

## 2019-08-23 LAB — PATHOLOGIST SMEAR REVIEW

## 2019-08-31 DIAGNOSIS — R188 Other ascites: Secondary | ICD-10-CM | POA: Diagnosis not present

## 2019-08-31 DIAGNOSIS — K7290 Hepatic failure, unspecified without coma: Secondary | ICD-10-CM | POA: Diagnosis not present

## 2019-08-31 DIAGNOSIS — I8500 Esophageal varices without bleeding: Secondary | ICD-10-CM | POA: Diagnosis not present

## 2019-08-31 DIAGNOSIS — R69 Illness, unspecified: Secondary | ICD-10-CM | POA: Diagnosis not present

## 2019-09-05 ENCOUNTER — Other Ambulatory Visit: Payer: Self-pay | Admitting: Surgery

## 2019-09-05 ENCOUNTER — Other Ambulatory Visit: Payer: Self-pay | Admitting: Nurse Practitioner

## 2019-09-05 DIAGNOSIS — R188 Other ascites: Secondary | ICD-10-CM

## 2019-09-09 ENCOUNTER — Ambulatory Visit (HOSPITAL_COMMUNITY): Admission: RE | Admit: 2019-09-09 | Payer: Medicare Other | Source: Ambulatory Visit

## 2019-09-09 ENCOUNTER — Encounter (HOSPITAL_COMMUNITY): Payer: Self-pay

## 2019-09-19 ENCOUNTER — Other Ambulatory Visit: Payer: Self-pay

## 2019-09-19 DIAGNOSIS — K7682 Hepatic encephalopathy: Secondary | ICD-10-CM

## 2019-09-19 DIAGNOSIS — K7031 Alcoholic cirrhosis of liver with ascites: Secondary | ICD-10-CM

## 2019-09-19 DIAGNOSIS — K7290 Hepatic failure, unspecified without coma: Secondary | ICD-10-CM

## 2019-09-19 MED ORDER — SPIRONOLACTONE 100 MG PO TABS: 100.0000 mg | ORAL_TABLET | Freq: Every day | ORAL | 1 refills | Status: DC

## 2019-09-19 NOTE — Addendum Note (Signed)
Addended by: Roetta Sessions on: 09/19/2019 04:37 PM   Modules accepted: Orders

## 2019-09-19 NOTE — Progress Notes (Signed)
Scheduled pt for Paracentesis on Friday, 09-23-19 at 9:00am at [facility]. To arrive at 8:30am.  Called and notified pt.  She expressed understanding.

## 2019-09-19 NOTE — Progress Notes (Signed)
Received refill request via fax for aldactone.  Dr. Havery Moros is out of town this week.  Amy Esterwood reviewed chart and latest labs from 08-18-19. Potassium of 3.0. She suggested I refill at current dosing  (100 mg once daily) and have pt go to the lab for CMET. I called patient and she said she will go to the lab but is requesting another IR paracentesis.  As Doc of the Day, is it OK for me to schedule her for another paracentesis? Last one was on 08-19-19.  Thank you

## 2019-09-19 NOTE — Progress Notes (Signed)
Great plan as above Please schedule for paracenteses per radiology protocol. Send fluid for-cell count, albumin, cultures and cytology RG

## 2019-09-23 ENCOUNTER — Inpatient Hospital Stay (HOSPITAL_COMMUNITY): Admission: RE | Admit: 2019-09-23 | Payer: Medicare HMO | Source: Ambulatory Visit

## 2019-09-26 ENCOUNTER — Telehealth: Payer: Self-pay | Admitting: Gastroenterology

## 2019-09-26 NOTE — Telephone Encounter (Signed)
Patient no showed her paracentesis that was scheduled for 4/2.  She has been rescheduled with the daughter for 09/27/19 9:00 at [facility].  Her daughter [name] notified

## 2019-09-27 ENCOUNTER — Ambulatory Visit (HOSPITAL_COMMUNITY)
Admission: RE | Admit: 2019-09-27 | Discharge: 2019-09-27 | Disposition: A | Discharge status: Home / Self Care | Payer: Medicare HMO | Source: Ambulatory Visit | Attending: Nurse Practitioner | Admitting: Nurse Practitioner

## 2019-09-27 ENCOUNTER — Other Ambulatory Visit: Payer: Self-pay

## 2019-09-27 ENCOUNTER — Encounter: Payer: Self-pay | Admitting: Radiology

## 2019-09-27 DIAGNOSIS — R188 Other ascites: Secondary | ICD-10-CM | POA: Insufficient documentation

## 2019-09-27 HISTORY — PX: IR PARACENTESIS: IMG2679

## 2019-09-27 LAB — BODY FLUID CELL COUNT WITH DIFFERENTIAL
Eos, Fluid: 0 %
Lymphs, Fluid: 53 %
Monocyte-Macrophage-Serous Fluid: 26 % — ABNORMAL LOW (ref 50–90)
Neutrophil Count, Fluid: 21 % (ref 0–25)
Total Nucleated Cell Count, Fluid: 23 cu mm (ref 0–1000)

## 2019-09-27 LAB — GRAM STAIN

## 2019-09-27 MED ORDER — LIDOCAINE HCL 1 % IJ SOLN
INTRAMUSCULAR | Status: AC
  Filled 2019-09-27: qty 20

## 2019-09-27 MED ORDER — LIDOCAINE HCL 1 % IJ SOLN
INTRAMUSCULAR | Status: DC | PRN
  Administered 2019-09-27: 10 mL

## 2019-09-27 NOTE — Procedures (Signed)
Ultrasound-guided diagnostic and therapeutic paracentesis performed yielding 8 liters of straw colored fluid.  Fluid was sent to lab for analysis. No immediate complications. EBL is none.

## 2019-09-28 LAB — CYTOLOGY - NON PAP

## 2019-10-02 LAB — CULTURE, BODY FLUID W GRAM STAIN -BOTTLE: Culture: NO GROWTH

## 2019-10-03 ENCOUNTER — Telehealth: Payer: Self-pay

## 2019-10-03 NOTE — Telephone Encounter (Signed)
LM for daughter to call back to schedule an OV (F/U alcoholic cirrhosis) for her mom, Katie Holland and she needs blood work. Order for CMET is in.  Armbruster has openings in May for OV.

## 2019-10-03 NOTE — Telephone Encounter (Signed)
-----   Message from Roetta Sessions, [place] sent at 09/29/2019  5:36 PM EDT ----- Regarding: FW: office visit or labs Talk to  Philip (Daughter)  [phone]   ----- Message ----- From: Yetta Flock, MD Sent: 09/28/2019   7:16 PM EDT To: Roetta Sessions, CMA Subject: RE: office visit or labs?                      Thanks. Yes due for lab draw - CMET Also due for routine office visit. Thanks ----- Message ----- From: Roetta Sessions, CMA Sent: 09/28/2019 To: Yetta Flock, MD Subject: office visit or labs?                          When you were gone last week we received a refill request for Ms. Robina Ade for spironolactone.  I refilled it at the current dose (100mg  daily) but wanted to check with you to see when she will needs labs again and/or an office visit again. She had a CMET on 08-18-19. Thank you.

## 2019-10-04 DIAGNOSIS — I81 Portal vein thrombosis: Secondary | ICD-10-CM | POA: Diagnosis not present

## 2019-10-04 DIAGNOSIS — R69 Illness, unspecified: Secondary | ICD-10-CM | POA: Diagnosis not present

## 2019-10-04 DIAGNOSIS — K7290 Hepatic failure, unspecified without coma: Secondary | ICD-10-CM | POA: Diagnosis not present

## 2019-10-04 DIAGNOSIS — Z720 Tobacco use: Secondary | ICD-10-CM | POA: Diagnosis not present

## 2019-10-04 DIAGNOSIS — J948 Other specified pleural conditions: Secondary | ICD-10-CM | POA: Diagnosis not present

## 2019-10-04 DIAGNOSIS — Z9111 Patient's noncompliance with dietary regimen: Secondary | ICD-10-CM | POA: Diagnosis not present

## 2019-10-04 DIAGNOSIS — Z7289 Other problems related to lifestyle: Secondary | ICD-10-CM | POA: Diagnosis not present

## 2019-10-04 DIAGNOSIS — I8500 Esophageal varices without bleeding: Secondary | ICD-10-CM | POA: Diagnosis not present

## 2019-10-05 ENCOUNTER — Other Ambulatory Visit: Payer: Self-pay | Admitting: Nurse Practitioner

## 2019-10-05 ENCOUNTER — Other Ambulatory Visit (HOSPITAL_COMMUNITY): Payer: Self-pay | Admitting: Nurse Practitioner

## 2019-10-05 DIAGNOSIS — R188 Other ascites: Secondary | ICD-10-CM

## 2019-10-05 NOTE — Telephone Encounter (Signed)
Patient called back and scheduled an appt with Dr. Loni Muse in May.  She understands she needs to go to the lab one day this week or next.  Spoke to he daughter [name] and confirmed both.

## 2019-10-07 ENCOUNTER — Ambulatory Visit (HOSPITAL_COMMUNITY)
Admission: RE | Admit: 2019-10-07 | Discharge: 2019-10-07 | Disposition: A | Discharge status: Home / Self Care | Payer: Medicare HMO | Source: Ambulatory Visit | Attending: Nurse Practitioner | Admitting: Nurse Practitioner

## 2019-10-07 ENCOUNTER — Other Ambulatory Visit: Payer: Self-pay

## 2019-10-07 DIAGNOSIS — R188 Other ascites: Secondary | ICD-10-CM | POA: Diagnosis not present

## 2019-10-07 HISTORY — PX: IR PARACENTESIS: IMG2679

## 2019-10-07 LAB — BODY FLUID CELL COUNT WITH DIFFERENTIAL
Eos, Fluid: 0 %
Lymphs, Fluid: 12 %
Monocyte-Macrophage-Serous Fluid: 78 % (ref 50–90)
Neutrophil Count, Fluid: 10 % (ref 0–25)
Total Nucleated Cell Count, Fluid: 97 cu mm (ref 0–1000)

## 2019-10-07 LAB — GRAM STAIN

## 2019-10-07 MED ORDER — LIDOCAINE HCL (PF) 1 % IJ SOLN
INTRAMUSCULAR | Status: DC | PRN
  Administered 2019-10-07: 10 mL

## 2019-10-07 MED ORDER — LIDOCAINE HCL 1 % IJ SOLN
INTRAMUSCULAR | Status: AC
  Filled 2019-10-07: qty 20

## 2019-10-07 MED ORDER — ALBUMIN HUMAN 25 % IV SOLN
50.0000 g | Freq: Once | INTRAVENOUS | Status: AC
  Administered 2019-10-07: 50 g via INTRAVENOUS

## 2019-10-07 MED ORDER — ALBUMIN HUMAN 25 % IV SOLN
INTRAVENOUS | Status: AC
  Filled 2019-10-07: qty 200

## 2019-10-07 NOTE — Procedures (Signed)
PROCEDURE SUMMARY:  Successful US guided paracentesis from right lateral abdomen.  Yielded 6.8 liters of yellow fluid.  No immediate complications.  Pt tolerated well.   Specimen was sent for labs.  EBL < 57mL  Docia Barrier PA-C 10/07/2019 4:22 PM

## 2019-10-10 ENCOUNTER — Other Ambulatory Visit (INDEPENDENT_AMBULATORY_CARE_PROVIDER_SITE_OTHER): Payer: Medicare HMO

## 2019-10-10 DIAGNOSIS — K7031 Alcoholic cirrhosis of liver with ascites: Secondary | ICD-10-CM

## 2019-10-10 DIAGNOSIS — R69 Illness, unspecified: Secondary | ICD-10-CM | POA: Diagnosis not present

## 2019-10-10 LAB — COMPREHENSIVE METABOLIC PANEL
ALT: 14 U/L (ref 0–35)
AST: 44 U/L — ABNORMAL HIGH (ref 0–37)
Albumin: 3.1 g/dL — ABNORMAL LOW (ref 3.5–5.2)
Alkaline Phosphatase: 205 U/L — ABNORMAL HIGH (ref 39–117)
BUN: 9 mg/dL (ref 6–23)
CO2: 24 mEq/L (ref 19–32)
Calcium: 8.7 mg/dL (ref 8.4–10.5)
Chloride: 104 mEq/L (ref 96–112)
Creatinine, Ser: 0.73 mg/dL (ref 0.40–1.20)
GFR: 100.24 mL/min (ref >60.00)
Glucose, Bld: 94 mg/dL (ref 70–99)
Potassium: 3.7 mEq/L (ref 3.5–5.1)
Sodium: 138 mEq/L (ref 135–145)
Total Bilirubin: 1.9 mg/dL — ABNORMAL HIGH (ref 0.2–1.2)
Total Protein: 6.8 g/dL (ref 6.0–8.3)

## 2019-10-11 LAB — CYTOLOGY - NON PAP

## 2019-10-26 ENCOUNTER — Telehealth: Payer: Self-pay | Admitting: Gastroenterology

## 2019-10-26 DIAGNOSIS — K7031 Alcoholic cirrhosis of liver with ascites: Secondary | ICD-10-CM

## 2019-10-26 NOTE — Telephone Encounter (Signed)
Yes.  Send fluid for cell count with differential.

## 2019-10-26 NOTE — Telephone Encounter (Signed)
Dr. Henrene Pastor you are MD of the afternoon.  Patient of Dr. Havery Moros with a hx of cirrhosis and ascites.  Patient is requesting paracentesis.  Last one was 4/16 and 6.8 liters removed. Okay to schedule paracentesis with albumin replacement for > 5 liters removed?

## 2019-10-27 NOTE — Telephone Encounter (Signed)
Patient has been scheduled for paracentesis on 10/28/19 2:00 at [facility].    Left message for patient to call back

## 2019-10-28 ENCOUNTER — Ambulatory Visit (HOSPITAL_COMMUNITY)
Admission: RE | Admit: 2019-10-28 | Discharge: 2019-10-28 | Disposition: A | Discharge status: Home / Self Care | Payer: Medicare HMO | Source: Ambulatory Visit | Attending: Gastroenterology | Admitting: Gastroenterology

## 2019-10-28 ENCOUNTER — Other Ambulatory Visit: Payer: Self-pay

## 2019-10-28 DIAGNOSIS — R69 Illness, unspecified: Secondary | ICD-10-CM | POA: Diagnosis not present

## 2019-10-28 DIAGNOSIS — K7031 Alcoholic cirrhosis of liver with ascites: Secondary | ICD-10-CM | POA: Diagnosis present

## 2019-10-28 DIAGNOSIS — R188 Other ascites: Secondary | ICD-10-CM | POA: Diagnosis not present

## 2019-10-28 HISTORY — PX: IR PARACENTESIS: IMG2679

## 2019-10-28 LAB — BODY FLUID CELL COUNT WITH DIFFERENTIAL
Eos, Fluid: 0 %
Lymphs, Fluid: 28 %
Monocyte-Macrophage-Serous Fluid: 56 % (ref 50–90)
Neutrophil Count, Fluid: 16 % (ref 0–25)
Total Nucleated Cell Count, Fluid: 76 cu mm (ref 0–1000)

## 2019-10-28 MED ORDER — ALBUMIN HUMAN 25 % IV SOLN
INTRAVENOUS | Status: AC
  Administered 2019-10-28: 16:00:00 50 g via INTRAVENOUS
  Filled 2019-10-28: qty 200

## 2019-10-28 MED ORDER — LIDOCAINE HCL 1 % IJ SOLN
INTRAMUSCULAR | Status: AC
  Administered 2019-10-28: 15:00:00 10 mL
  Filled 2019-10-28: qty 20

## 2019-10-28 MED ORDER — ALBUMIN HUMAN 25 % IV SOLN: 50.0000 g | Freq: Once | INTRAVENOUS | Status: AC

## 2019-10-28 NOTE — Telephone Encounter (Signed)
Patient's daughter returned call and they got the message about appt today and will go

## 2019-10-28 NOTE — Procedures (Signed)
PROCEDURE SUMMARY:  Successful US guided paracentesis from right abdomen.  Yielded 6.9 L of clear yellow fluid.  No immediate complications.  Pt tolerated well.   Specimen sent for labs. Albumin administered.  EBL < 35mL  Milynn Quirion R Melven Stockard,  NP 10/28/2019 4:33 PM

## 2019-10-28 NOTE — Telephone Encounter (Signed)
I have been unable to reach the daughter or the patient to notify them of the appt.  Patient's phone no answer. I left a detailed message on the daughter's phone with detailed instructions for today's appt.

## 2019-10-30 DIAGNOSIS — M6284 Sarcopenia: Secondary | ICD-10-CM | POA: Diagnosis not present

## 2019-10-30 DIAGNOSIS — R69 Illness, unspecified: Secondary | ICD-10-CM | POA: Diagnosis not present

## 2019-10-30 DIAGNOSIS — R339 Retention of urine, unspecified: Secondary | ICD-10-CM | POA: Diagnosis not present

## 2019-10-30 DIAGNOSIS — K429 Umbilical hernia without obstruction or gangrene: Secondary | ICD-10-CM | POA: Diagnosis not present

## 2019-10-30 DIAGNOSIS — K420 Umbilical hernia with obstruction, without gangrene: Secondary | ICD-10-CM | POA: Diagnosis not present

## 2019-10-30 DIAGNOSIS — K900 Celiac disease: Secondary | ICD-10-CM | POA: Diagnosis not present

## 2019-10-30 DIAGNOSIS — I8510 Secondary esophageal varices without bleeding: Secondary | ICD-10-CM | POA: Diagnosis not present

## 2019-10-30 DIAGNOSIS — R188 Other ascites: Secondary | ICD-10-CM | POA: Diagnosis not present

## 2019-10-30 DIAGNOSIS — I81 Portal vein thrombosis: Secondary | ICD-10-CM | POA: Diagnosis not present

## 2019-10-30 DIAGNOSIS — I8500 Esophageal varices without bleeding: Secondary | ICD-10-CM | POA: Diagnosis not present

## 2019-10-30 DIAGNOSIS — K7460 Unspecified cirrhosis of liver: Secondary | ICD-10-CM | POA: Diagnosis not present

## 2019-10-30 DIAGNOSIS — R109 Unspecified abdominal pain: Secondary | ICD-10-CM | POA: Diagnosis not present

## 2019-10-30 DIAGNOSIS — E441 Mild protein-calorie malnutrition: Secondary | ICD-10-CM | POA: Diagnosis not present

## 2019-10-30 DIAGNOSIS — K7290 Hepatic failure, unspecified without coma: Secondary | ICD-10-CM | POA: Diagnosis not present

## 2019-10-30 DIAGNOSIS — K56609 Unspecified intestinal obstruction, unspecified as to partial versus complete obstruction: Secondary | ICD-10-CM | POA: Diagnosis not present

## 2019-10-30 DIAGNOSIS — J918 Pleural effusion in other conditions classified elsewhere: Secondary | ICD-10-CM | POA: Diagnosis not present

## 2019-10-30 DIAGNOSIS — R600 Localized edema: Secondary | ICD-10-CM | POA: Diagnosis not present

## 2019-10-31 LAB — PATHOLOGIST SMEAR REVIEW

## 2019-11-02 DIAGNOSIS — R188 Other ascites: Secondary | ICD-10-CM | POA: Diagnosis not present

## 2019-11-02 DIAGNOSIS — K7460 Unspecified cirrhosis of liver: Secondary | ICD-10-CM | POA: Diagnosis not present

## 2019-11-08 ENCOUNTER — Ambulatory Visit: Payer: Medicare HMO | Admitting: Gastroenterology

## 2019-11-08 DIAGNOSIS — I8500 Esophageal varices without bleeding: Secondary | ICD-10-CM | POA: Diagnosis not present

## 2019-11-08 DIAGNOSIS — J948 Other specified pleural conditions: Secondary | ICD-10-CM | POA: Diagnosis not present

## 2019-11-08 DIAGNOSIS — K7290 Hepatic failure, unspecified without coma: Secondary | ICD-10-CM | POA: Diagnosis not present

## 2019-11-08 DIAGNOSIS — Z7289 Other problems related to lifestyle: Secondary | ICD-10-CM | POA: Diagnosis not present

## 2019-11-08 DIAGNOSIS — R69 Illness, unspecified: Secondary | ICD-10-CM | POA: Diagnosis not present

## 2019-11-08 DIAGNOSIS — I81 Portal vein thrombosis: Secondary | ICD-10-CM | POA: Diagnosis not present

## 2019-11-08 DIAGNOSIS — Z9111 Patient's noncompliance with dietary regimen: Secondary | ICD-10-CM | POA: Diagnosis not present

## 2019-11-08 DIAGNOSIS — K7469 Other cirrhosis of liver: Secondary | ICD-10-CM | POA: Diagnosis not present

## 2019-11-08 DIAGNOSIS — R188 Other ascites: Secondary | ICD-10-CM | POA: Diagnosis not present

## 2019-11-10 ENCOUNTER — Ambulatory Visit (HOSPITAL_COMMUNITY): Admission: RE | Admit: 2019-11-10 | Payer: Medicare HMO | Source: Ambulatory Visit

## 2019-11-10 ENCOUNTER — Encounter (HOSPITAL_COMMUNITY): Payer: Self-pay

## 2019-11-16 ENCOUNTER — Other Ambulatory Visit: Payer: Self-pay

## 2019-11-16 ENCOUNTER — Ambulatory Visit (HOSPITAL_COMMUNITY)
Admission: RE | Admit: 2019-11-16 | Discharge: 2019-11-16 | Disposition: A | Discharge status: Home / Self Care | Payer: Medicare HMO | Source: Ambulatory Visit | Attending: Gastroenterology | Admitting: Gastroenterology

## 2019-11-16 DIAGNOSIS — K7682 Hepatic encephalopathy: Secondary | ICD-10-CM

## 2019-11-16 DIAGNOSIS — K7031 Alcoholic cirrhosis of liver with ascites: Secondary | ICD-10-CM

## 2019-11-16 DIAGNOSIS — K7290 Hepatic failure, unspecified without coma: Secondary | ICD-10-CM | POA: Diagnosis not present

## 2019-11-16 DIAGNOSIS — R69 Illness, unspecified: Secondary | ICD-10-CM | POA: Diagnosis not present

## 2019-11-16 DIAGNOSIS — R188 Other ascites: Secondary | ICD-10-CM | POA: Diagnosis not present

## 2019-11-16 HISTORY — PX: IR PARACENTESIS: IMG2679

## 2019-11-16 LAB — GRAM STAIN

## 2019-11-16 LAB — BODY FLUID CELL COUNT WITH DIFFERENTIAL
Eos, Fluid: 0 %
Lymphs, Fluid: 17 %
Monocyte-Macrophage-Serous Fluid: 73 % (ref 50–90)
Neutrophil Count, Fluid: 10 % (ref 0–25)
Total Nucleated Cell Count, Fluid: 82 cu mm (ref 0–1000)

## 2019-11-16 LAB — ALBUMIN, PLEURAL OR PERITONEAL FLUID: Albumin, Fluid: <1 g/dL

## 2019-11-16 MED ORDER — ALBUMIN HUMAN 25 % IV SOLN
INTRAVENOUS | Status: AC
  Filled 2019-11-16: qty 200

## 2019-11-16 MED ORDER — LIDOCAINE HCL (PF) 1 % IJ SOLN
INTRAMUSCULAR | Status: DC | PRN
  Administered 2019-11-16: 10 mL

## 2019-11-16 MED ORDER — LIDOCAINE HCL 1 % IJ SOLN
INTRAMUSCULAR | Status: AC
  Filled 2019-11-16: qty 20

## 2019-11-16 NOTE — Procedures (Signed)
PROCEDURE SUMMARY:  Successful US guided paracentesis from left lateral abdomen.  Yielded 6.3 liters of yellow fluid.  No immediate complications.  Pt tolerated well.   Specimen was sent for labs.  EBL < 10mL  Docia Barrier PA-C 11/16/2019 1:23 PM

## 2019-11-17 LAB — CYTOLOGY - NON PAP

## 2019-11-18 DIAGNOSIS — I10 Essential (primary) hypertension: Secondary | ICD-10-CM | POA: Diagnosis not present

## 2019-11-18 DIAGNOSIS — R69 Illness, unspecified: Secondary | ICD-10-CM | POA: Diagnosis not present

## 2019-11-18 DIAGNOSIS — I81 Portal vein thrombosis: Secondary | ICD-10-CM | POA: Diagnosis not present

## 2019-11-18 DIAGNOSIS — Z5329 Procedure and treatment not carried out because of patient's decision for other reasons: Secondary | ICD-10-CM | POA: Diagnosis not present

## 2019-11-18 DIAGNOSIS — Z5189 Encounter for other specified aftercare: Secondary | ICD-10-CM | POA: Diagnosis not present

## 2019-11-18 DIAGNOSIS — I8510 Secondary esophageal varices without bleeding: Secondary | ICD-10-CM | POA: Diagnosis not present

## 2019-11-18 DIAGNOSIS — Z79899 Other long term (current) drug therapy: Secondary | ICD-10-CM | POA: Diagnosis not present

## 2019-11-21 LAB — CULTURE, BODY FLUID W GRAM STAIN -BOTTLE: Culture: NO GROWTH

## 2019-11-23 DIAGNOSIS — R188 Other ascites: Secondary | ICD-10-CM | POA: Diagnosis not present

## 2019-11-23 DIAGNOSIS — R69 Illness, unspecified: Secondary | ICD-10-CM | POA: Diagnosis not present

## 2019-11-23 DIAGNOSIS — K429 Umbilical hernia without obstruction or gangrene: Secondary | ICD-10-CM | POA: Diagnosis not present

## 2019-11-28 ENCOUNTER — Other Ambulatory Visit: Payer: Self-pay | Admitting: Surgery

## 2019-11-28 DIAGNOSIS — K429 Umbilical hernia without obstruction or gangrene: Secondary | ICD-10-CM

## 2019-12-01 ENCOUNTER — Other Ambulatory Visit: Payer: Self-pay

## 2019-12-01 ENCOUNTER — Ambulatory Visit (HOSPITAL_BASED_OUTPATIENT_CLINIC_OR_DEPARTMENT_OTHER): Admission: RE | Admit: 2019-12-01 | Payer: Medicare HMO | Source: Ambulatory Visit

## 2019-12-01 ENCOUNTER — Telehealth: Payer: Self-pay | Admitting: Gastroenterology

## 2019-12-01 DIAGNOSIS — K7031 Alcoholic cirrhosis of liver with ascites: Secondary | ICD-10-CM

## 2019-12-01 NOTE — Telephone Encounter (Signed)
Okay for paracentesis?  Last one 5/26? She was scheduled for a CT today that was cancelled by the patient

## 2019-12-01 NOTE — Telephone Encounter (Signed)
Patient notified of paracentesis at [facility] hospital radiology on 03/06/20 10:00

## 2019-12-01 NOTE — Telephone Encounter (Signed)
Yes, that's okay. Please give albumin if > 5L removed. I'm assuming she has not been taking her diuretics (has been noncompliant in the past). May be good to check BMET a few days after paracentesis to ensure stable. Thanks

## 2019-12-05 ENCOUNTER — Ambulatory Visit (HOSPITAL_COMMUNITY): Admission: RE | Admit: 2019-12-05 | Payer: Medicare HMO | Source: Ambulatory Visit

## 2019-12-05 ENCOUNTER — Telehealth: Payer: Self-pay | Admitting: Gastroenterology

## 2019-12-05 NOTE — Telephone Encounter (Signed)
Pt's daughter [name] called to r/s pt's paracentesis. She stated that pt had an appt. today in [place] so could not make it to her appt today. Few minutes after speaking with pt's daughter, the pt herself called stating that she was very confused and thought that we told her to go to [place] for imaging today. She got confused about her appt for paracentesis today and her f/u ov with Dr. Havery Moros in July. She reports to be very uncomfortable and would like to have paracentesis asap. Pls call her.

## 2019-12-06 NOTE — Telephone Encounter (Signed)
Katie Holland was contacted and provided the phone number to central scheduling.  She is asked to contact them and they will help her reschedule.

## 2019-12-07 ENCOUNTER — Telehealth (HOSPITAL_COMMUNITY): Payer: Self-pay | Admitting: Emergency Medicine

## 2019-12-07 ENCOUNTER — Emergency Department (HOSPITAL_COMMUNITY): Payer: Medicare HMO

## 2019-12-07 ENCOUNTER — Encounter (HOSPITAL_COMMUNITY): Payer: Self-pay

## 2019-12-07 ENCOUNTER — Ambulatory Visit (HOSPITAL_COMMUNITY): Payer: Medicare HMO

## 2019-12-07 ENCOUNTER — Emergency Department (HOSPITAL_COMMUNITY)
Admission: EM | Admit: 2019-12-07 | Discharge: 2019-12-07 | Disposition: A | Discharge status: Home / Self Care | Payer: Medicare HMO | Attending: Emergency Medicine | Admitting: Emergency Medicine

## 2019-12-07 ENCOUNTER — Other Ambulatory Visit: Payer: Self-pay

## 2019-12-07 DIAGNOSIS — R188 Other ascites: Secondary | ICD-10-CM | POA: Diagnosis not present

## 2019-12-07 DIAGNOSIS — Z91040 Latex allergy status: Secondary | ICD-10-CM | POA: Insufficient documentation

## 2019-12-07 DIAGNOSIS — F17210 Nicotine dependence, cigarettes, uncomplicated: Secondary | ICD-10-CM | POA: Diagnosis not present

## 2019-12-07 DIAGNOSIS — Z79899 Other long term (current) drug therapy: Secondary | ICD-10-CM | POA: Insufficient documentation

## 2019-12-07 DIAGNOSIS — Z1211 Encounter for screening for malignant neoplasm of colon: Secondary | ICD-10-CM | POA: Diagnosis not present

## 2019-12-07 DIAGNOSIS — R140 Abdominal distension (gaseous): Secondary | ICD-10-CM | POA: Diagnosis not present

## 2019-12-07 DIAGNOSIS — E876 Hypokalemia: Secondary | ICD-10-CM | POA: Insufficient documentation

## 2019-12-07 DIAGNOSIS — R69 Illness, unspecified: Secondary | ICD-10-CM | POA: Diagnosis not present

## 2019-12-07 LAB — CBC WITH DIFFERENTIAL/PLATELET
Abs Immature Granulocytes: 0.01 10*3/uL (ref 0.00–0.07)
Basophils Absolute: 0 10*3/uL (ref 0.0–0.1)
Basophils Relative: 1 %
Eosinophils Absolute: 0.2 10*3/uL (ref 0.0–0.5)
Eosinophils Relative: 4 %
HCT: 33.2 % — ABNORMAL LOW (ref 36.0–46.0)
Hemoglobin: 10.7 g/dL — ABNORMAL LOW (ref 12.0–15.0)
Immature Granulocytes: 0 %
Lymphocytes Relative: 30 %
Lymphs Abs: 1 10*3/uL (ref 0.7–4.0)
MCH: 27.4 pg (ref 26.0–34.0)
MCHC: 32.2 g/dL (ref 30.0–36.0)
MCV: 85.1 fL (ref 80.0–100.0)
Monocytes Absolute: 0.5 10*3/uL (ref 0.1–1.0)
Monocytes Relative: 14 %
Neutro Abs: 1.7 10*3/uL (ref 1.7–7.7)
Neutrophils Relative %: 51 %
Platelets: 146 10*3/uL — ABNORMAL LOW (ref 150–400)
RBC: 3.9 MIL/uL (ref 3.87–5.11)
RDW: 20.3 % — ABNORMAL HIGH (ref 11.5–15.5)
WBC: 3.4 10*3/uL — ABNORMAL LOW (ref 4.0–10.5)
nRBC: 0 % (ref 0.0–0.2)

## 2019-12-07 LAB — COMPREHENSIVE METABOLIC PANEL
ALT: 14 U/L (ref 0–44)
AST: 31 U/L (ref 15–41)
Albumin: 3.1 g/dL — ABNORMAL LOW (ref 3.5–5.0)
Alkaline Phosphatase: 113 U/L (ref 38–126)
Anion gap: 8 (ref 5–15)
BUN: 11 mg/dL (ref 6–20)
CO2: 24 mmol/L (ref 22–32)
Calcium: 9.1 mg/dL (ref 8.9–10.3)
Chloride: 109 mmol/L (ref 98–111)
Creatinine, Ser: 0.56 mg/dL (ref 0.44–1.00)
GFR calc Af Amer: >60 mL/min (ref >60)
GFR calc non Af Amer: >60 mL/min (ref >60)
Glucose, Bld: 90 mg/dL (ref 70–99)
Potassium: 3.6 mmol/L (ref 3.5–5.1)
Sodium: 141 mmol/L (ref 135–145)
Total Bilirubin: 2.4 mg/dL — ABNORMAL HIGH (ref 0.3–1.2)
Total Protein: 7.6 g/dL (ref 6.5–8.1)

## 2019-12-07 LAB — LIPASE, BLOOD: Lipase: 16 U/L (ref 11–51)

## 2019-12-07 LAB — ETHANOL: Alcohol, Ethyl (B): <10 mg/dL (ref <10)

## 2019-12-07 MED ORDER — ALBUMIN HUMAN 5 % IV SOLN
25.0000 g | Freq: Once | INTRAVENOUS | Status: AC
  Administered 2019-12-07: 25 g via INTRAVENOUS
  Filled 2019-12-07: qty 500

## 2019-12-07 MED ORDER — LIDOCAINE HCL 1 % IJ SOLN
INTRAMUSCULAR | Status: AC
  Filled 2019-12-07: qty 20

## 2019-12-07 NOTE — ED Notes (Signed)
Patient to Ultrasound for paracentesis

## 2019-12-07 NOTE — Procedures (Signed)
Ultrasound-guided therapeutic paracentesis performed yielding 6.9 liters of yellow fluid. No immediate complications. EBL none. Pt to receive IV albumin infusion in ED postprocedure.

## 2019-12-07 NOTE — Discharge Instructions (Addendum)
As discussed, your evaluation today has been largely reassuring.  But, it is important that you monitor your condition carefully, and do not hesitate to return to the ED if you develop new, or concerning changes in your condition. ? ?Otherwise, please follow-up with your physician for appropriate ongoing care. ? ?

## 2019-12-07 NOTE — ED Provider Notes (Signed)
[facility] DEPT Provider Note   CSN: [id number] Arrival date & time: 12/07/19  4098     History Chief Complaint  Patient presents with  . Abdominal Distension    Katie Holland is a 54 y.o. female.  HPI    Patient presents with concern of abdominal distention, discomfort. Patient has a history of cirrhosis, has recurrent ascites.  She has not had paracentesis in almost 2 months.  She typically gets this via facilitation from her GI physician. Today, it seems though there was a mixup, with access, and the patient presents for evaluation due to worsening abdominal distention, anorexia.  No fever, no vomiting, no confusion, no chest pain, no dyspnea.  Past Medical History:  Diagnosis Date  . Alcoholic liver disease ([place])   . Chronic back pain    on disability  . Cirrhosis of liver ([place])   . Esophageal varices ([place])   . GERD (gastroesophageal reflux disease)   . Headache   . Jaundice     Patient Active Problem List   Diagnosis Date Noted  . Colon cancer screening   . Encephalopathy, hepatic ([place]) 05/06/2019  . Ascites 01/20/2019  . Pleural effusion 01/20/2019  . UTI (urinary tract infection) 01/20/2019  . Acute upper GI bleeding 06/02/2018  . Alcohol dependence syndrome ([place]) 06/02/2018  . Decompensated hepatic cirrhosis ([place])   . Esophageal varices ([place])   . Alcoholic liver disease ([place])   . Protein-calorie malnutrition, severe 04/26/2018  . Bleeding esophageal varices ([place])   . Abdominal pain 04/24/2018  . Hematemesis 04/24/2018  . Hematochezia 04/24/2018  . Alcohol abuse 04/24/2018  . Hyponatremia 04/24/2018  . Hypokalemia 04/24/2018  . Metabolic acidosis, normal anion gap (NAG) 04/24/2018  . History of GI bleed 10/26/2017  . Alcoholic cirrhosis of liver with ascites ([place]) 10/26/2017  . Tobacco dependence 10/26/2017    Past Surgical History:  Procedure Laterality Date  . BACK SURGERY     L3-L4  . COLONOSCOPY WITH PROPOFOL N/A  08/01/2019   Procedure: COLONOSCOPY WITH PROPOFOL;  Surgeon: Yetta Flock, MD;  Location: [facility] ENDOSCOPY;  Service: Gastroenterology;  Laterality: N/A;  . ESOPHAGEAL BANDING  04/25/2018   Procedure: ESOPHAGEAL BANDING;  Surgeon: Yetta Flock, MD;  Location: [facility] ENDOSCOPY;  Service: Gastroenterology;;  . ESOPHAGEAL BANDING  04/27/2018   Procedure: ESOPHAGEAL BANDING;  Surgeon: Milus Banister, MD;  Location: [facility];  Service: Endoscopy;;  . ESOPHAGEAL BANDING  06/02/2018   Procedure: ESOPHAGEAL BANDING;  Surgeon: Doran Stabler, MD;  Location: [place];  Service: Gastroenterology;;  . ESOPHAGEAL BANDING N/A 07/07/2018   Procedure: ESOPHAGEAL BANDING;  Surgeon: Yetta Flock, MD;  Location: [facility] ENDOSCOPY;  Service: Gastroenterology;  Laterality: N/A;  . ESOPHAGEAL BANDING  07/27/2018   Procedure: ESOPHAGEAL BANDING;  Surgeon: Yetta Flock, MD;  Location: [facility] ENDOSCOPY;  Service: Gastroenterology;;  . ESOPHAGEAL BANDING  08/30/2018   Procedure: ESOPHAGEAL BANDING;  Surgeon: Yetta Flock, MD;  Location: [facility] ENDOSCOPY;  Service: Gastroenterology;;  . ESOPHAGOGASTRODUODENOSCOPY N/A 04/27/2018   Procedure: ESOPHAGOGASTRODUODENOSCOPY (EGD);  Surgeon: Milus Banister, MD;  Location: [facility] ENDOSCOPY;  Service: Endoscopy;  Laterality: N/A;  . ESOPHAGOGASTRODUODENOSCOPY (EGD) WITH PROPOFOL N/A 04/25/2018   Procedure: ESOPHAGOGASTRODUODENOSCOPY (EGD) WITH PROPOFOL;  Surgeon: Yetta Flock, MD;  Location: [facility];  Service: Gastroenterology;  Laterality: N/A;  . ESOPHAGOGASTRODUODENOSCOPY (EGD) WITH PROPOFOL N/A 06/02/2018   Procedure: ESOPHAGOGASTRODUODENOSCOPY (EGD) WITH PROPOFOL;  Surgeon: Doran Stabler, MD;  Location: [facility];  Service: Gastroenterology;  Laterality: N/A;  . ESOPHAGOGASTRODUODENOSCOPY (EGD) WITH PROPOFOL N/A 07/07/2018   Procedure: ESOPHAGOGASTRODUODENOSCOPY (EGD) WITH PROPOFOL;  Surgeon: Yetta Flock, MD;  Location: [facility]  ENDOSCOPY;  Service: Gastroenterology;  Laterality: N/A;  . ESOPHAGOGASTRODUODENOSCOPY (EGD) WITH PROPOFOL N/A 07/27/2018   Procedure: ESOPHAGOGASTRODUODENOSCOPY (EGD) WITH PROPOFOL;  Surgeon: Yetta Flock, MD;  Location: [facility] ENDOSCOPY;  Service: Gastroenterology;  Laterality: N/A;  . ESOPHAGOGASTRODUODENOSCOPY (EGD) WITH PROPOFOL N/A 08/30/2018   Procedure: ESOPHAGOGASTRODUODENOSCOPY (EGD) WITH PROPOFOL;  Surgeon: Yetta Flock, MD;  Location: [facility] ENDOSCOPY;  Service: Gastroenterology;  Laterality: N/A;  . ESOPHAGOGASTRODUODENOSCOPY (EGD) WITH PROPOFOL N/A 11/08/2018   Procedure: ESOPHAGOGASTRODUODENOSCOPY (EGD) WITH PROPOFOL;  Surgeon: Yetta Flock, MD;  Location: [facility] ENDOSCOPY;  Service: Gastroenterology;  Laterality: N/A;  . ESOPHAGOGASTRODUODENOSCOPY (EGD) WITH PROPOFOL N/A 01/31/2019   Procedure: ESOPHAGOGASTRODUODENOSCOPY (EGD) WITH PROPOFOL;  Surgeon: Yetta Flock, MD;  Location: [facility] ENDOSCOPY;  Service: Gastroenterology;  Laterality: N/A;  . ESOPHAGOGASTRODUODENOSCOPY (EGD) WITH PROPOFOL N/A 08/01/2019   Procedure: ESOPHAGOGASTRODUODENOSCOPY (EGD) WITH PROPOFOL;  Surgeon: Yetta Flock, MD;  Location: [facility] ENDOSCOPY;  Service: Gastroenterology;  Laterality: N/A;  . IR PARACENTESIS  04/26/2018  . IR PARACENTESIS  05/18/2018  . IR PARACENTESIS  05/31/2018  . IR PARACENTESIS  06/30/2018  . IR PARACENTESIS  08/05/2018  . IR PARACENTESIS  08/31/2018  . IR PARACENTESIS  10/08/2018  . IR PARACENTESIS  10/28/2018  . IR PARACENTESIS  02/14/2019  . IR PARACENTESIS  03/09/2019  . IR PARACENTESIS  04/05/2019  . IR PARACENTESIS  05/03/2019  . IR PARACENTESIS  06/20/2019  . IR PARACENTESIS  07/19/2019  . IR PARACENTESIS  08/19/2019  . IR PARACENTESIS  09/27/2019  . IR PARACENTESIS  10/07/2019  . IR PARACENTESIS  10/28/2019  . IR PARACENTESIS  11/16/2019  . IR RADIOLOGIST EVAL & MGMT  12/23/2018  . IR THORACENTESIS ASP PLEURAL SPACE W/IMG GUIDE  12/10/2018  . IR THORACENTESIS ASP PLEURAL  SPACE W/IMG GUIDE  01/21/2019     OB History   No obstetric history on file.     Family History  Problem Relation Age of Onset  . Colon cancer Neg Hx   . Esophageal cancer Neg Hx   . Pancreatic cancer Neg Hx   . Stomach cancer Neg Hx     Social History   Tobacco Use  . Smoking status: Current Every Day Smoker    Packs/day: 1.00    Years: 20.00    Pack years: 20.00    Types: Cigarettes  . Smokeless tobacco: Never Used  . Tobacco comment: 5 a day  Vaping Use  . Vaping Use: Never used  Substance Use Topics  . Alcohol use: Not Currently    Comment: drinks a beer every few days   . Drug use: Not Currently    Home Medications Prior to Admission medications   Medication Sig Start Date End Date Taking? Authorizing Provider  furosemide (LASIX) 20 MG tablet Take 1 tablet (20 mg total) by mouth 2 (two) times daily. 06/23/19  Yes Armbruster, Carlota Raspberry, MD  Multiple Vitamin (MULTIVITAMIN WITH MINERALS) TABS tablet Take 1 tablet by mouth.    Yes [provider]  rifaximin (XIFAXAN) 550 MG TABS tablet Take 1 tablet (550 mg total) by mouth 2 (two) times daily. 06/23/19  Yes Armbruster, Carlota Raspberry, MD  lactulose (CHRONULAC) 10 GM/15ML solution Take 45 mLs (30 g total) by mouth 3 (three) times daily. Patient not taking: Reported on 12/07/2019 04/27/19   Armbruster, Carlota Raspberry, MD  nicotine (NICODERM  CQ - DOSED IN MG/24 HOURS) 14 mg/24hr patch Place 1 patch (14 mg total) onto the skin daily. Patient not taking: Reported on 12/07/2019 01/23/19   Deatra James, MD  potassium chloride SA (KLOR-CON) 20 MEQ tablet Take 1 tablet (20 mEq total) by mouth 2 (two) times daily. Patient not taking: Reported on 0/17/5102 5/85/27   Delora Fuel, MD  spironolactone (ALDACTONE) 100 MG tablet Take 1 tablet (100 mg total) by mouth daily. Patient not taking: Reported on 12/07/2019 09/19/19   Yetta Flock, MD    Allergies    Contrast media [iodinated diagnostic agents], Latex, and Sulfa  antibiotics  Review of Systems   Review of Systems  Constitutional:       Per HPI, otherwise negative  HENT:       Per HPI, otherwise negative  Respiratory:       Per HPI, otherwise negative  Cardiovascular:       Per HPI, otherwise negative  Gastrointestinal: Positive for abdominal distention and abdominal pain. Negative for vomiting.  Endocrine:       Negative aside from HPI  Genitourinary:       Neg aside from HPI   Musculoskeletal:       Per HPI, otherwise negative  Skin: Negative.   Neurological: Negative for syncope.    Physical Exam Updated Vital Signs BP (!) 138/97 (BP Location: Left Arm)   Pulse (!) 114   Temp 98.1 F (36.7 C) (Oral)   Resp 18   SpO2 100%   Physical Exam Vitals and nursing note reviewed.  Constitutional:      Appearance: She is well-developed.  HENT:     Head: Normocephalic and atraumatic.  Eyes:     Conjunctiva/sclera: Conjunctivae normal.  Cardiovascular:     Rate and Rhythm: Regular rhythm. Tachycardia present.  Pulmonary:     Effort: Pulmonary effort is normal. No respiratory distress.     Breath sounds: Normal breath sounds. No stridor.  Abdominal:     General: There is distension.     Tenderness: There is abdominal tenderness. There is guarding.  Skin:    General: Skin is warm and dry.  Neurological:     Mental Status: She is alert and oriented to person, place, and time.     Cranial Nerves: No cranial nerve deficit.     ED Results / Procedures / Treatments   Labs (all labs ordered are listed, but only abnormal results are displayed) Labs Reviewed  COMPREHENSIVE METABOLIC PANEL - Abnormal; Notable for the following components:      Result Value   Albumin 3.1 (*)    Total Bilirubin 2.4 (*)    All other components within normal limits  CBC WITH DIFFERENTIAL/PLATELET - Abnormal; Notable for the following components:   WBC 3.4 (*)    Hemoglobin 10.7 (*)    HCT 33.2 (*)    RDW 20.3 (*)    Platelets 146 (*)    All other  components within normal limits  ETHANOL  LIPASE, BLOOD     Radiology 7 L fluid removed via ultrasound-guided paracentesis  Procedures Procedures (including critical care time)  Medications Ordered in ED Medications  albumin human 5 % solution 25 g (0 g Intravenous Stopped 12/07/19 1219)  lidocaine (XYLOCAINE) 1 % (with pres) injection (  Given by Other 12/07/19 1423)    ED Course  I have reviewed the triage vital signs and the nursing notes.  Pertinent labs & imaging results that were available during my  care of the patient were reviewed by me and considered in my medical decision making (see chart for details).     Given patient's history of cirrhosis, new abdominal distention, differential including infection versus recurrent ascites versus electrolyte abnormalities versus other consequences of her history of alcohol abuse considered. Patient had labs, ultrasound paracentesis ordered. MDM Rules/Calculators/A&P                          2:57 PM Patient sitting up, in no distress, notes that she feels markedly better. No additional complaints, no hemodynamic instability. Reviewed reassuring results, and now following removal of 7 L via paracentesis, provision of albumin, patient is amenable to, appropriate for discharge with close outpatient GI follow-up. Final Clinical Impression(s) / ED Diagnoses Final diagnoses:  Ascites     Carmin Muskrat, MD 12/07/19 1459

## 2019-12-07 NOTE — ED Notes (Signed)
Patient returned from Paracentesis 7L removed

## 2019-12-07 NOTE — ED Triage Notes (Signed)
Pt arrives POV from home with complaints of abdominal distension. Pt reports a hx of liver disease. Pt reports that she has to have a paracentesis q 3 months or so. Pt reports it has been about three months. Pt reports hernia surgery one month ago.

## 2019-12-13 ENCOUNTER — Encounter (HOSPITAL_COMMUNITY): Payer: Self-pay

## 2019-12-13 ENCOUNTER — Ambulatory Visit (HOSPITAL_COMMUNITY): Payer: Medicare HMO

## 2019-12-13 DIAGNOSIS — Z8249 Family history of ischemic heart disease and other diseases of the circulatory system: Secondary | ICD-10-CM | POA: Diagnosis not present

## 2019-12-13 DIAGNOSIS — K7031 Alcoholic cirrhosis of liver with ascites: Secondary | ICD-10-CM | POA: Diagnosis not present

## 2019-12-13 DIAGNOSIS — R69 Illness, unspecified: Secondary | ICD-10-CM | POA: Diagnosis not present

## 2019-12-13 DIAGNOSIS — Z882 Allergy status to sulfonamides status: Secondary | ICD-10-CM | POA: Diagnosis not present

## 2019-12-13 DIAGNOSIS — Z720 Tobacco use: Secondary | ICD-10-CM | POA: Diagnosis not present

## 2019-12-13 DIAGNOSIS — K5900 Constipation, unspecified: Secondary | ICD-10-CM | POA: Diagnosis not present

## 2019-12-29 ENCOUNTER — Ambulatory Visit: Payer: Medicare HMO | Admitting: Gastroenterology

## 2019-12-29 ENCOUNTER — Telehealth: Payer: Self-pay | Admitting: Gastroenterology

## 2019-12-29 ENCOUNTER — Emergency Department (HOSPITAL_COMMUNITY)
Admission: EM | Admit: 2019-12-29 | Discharge: 2019-12-30 | Discharge status: Against Medical Advice | Payer: Medicare HMO

## 2019-12-29 ENCOUNTER — Other Ambulatory Visit: Payer: Self-pay

## 2019-12-29 DIAGNOSIS — K7031 Alcoholic cirrhosis of liver with ascites: Secondary | ICD-10-CM

## 2019-12-29 DIAGNOSIS — K7682 Hepatic encephalopathy: Secondary | ICD-10-CM

## 2019-12-29 NOTE — Progress Notes (Signed)
Patient missed her office visit today with Dr. Havery Moros.  She showed up 35 mins after her appointment and said she wanted a paracentesis.  Dr. Havery Moros said she would need to reschedule .  Called and scheduled patient for a paracentesis at [facility] tomorrow, Friday 7-9 at 1:00 pm.  Chart indicates that pt is now at the ER.  Will check the chart in the am to see if patient received a draw while in the ER. Otherwise will call pt and inform her of her appt tomorrow.

## 2019-12-29 NOTE — Telephone Encounter (Signed)
Pt's daughter [name] called stating that pt missed her appt with Dr. Havery Moros today because pt and her spouse got lost so they were late for appt. [name] is wondering if pt could be scheduled for a paracentesis or be seen asap. Pls call her.

## 2019-12-30 ENCOUNTER — Ambulatory Visit (HOSPITAL_COMMUNITY)
Admission: RE | Admit: 2019-12-30 | Discharge: 2019-12-30 | Disposition: A | Discharge status: Home / Self Care | Payer: Medicare HMO | Source: Ambulatory Visit | Attending: Gastroenterology | Admitting: Gastroenterology

## 2019-12-30 DIAGNOSIS — K7031 Alcoholic cirrhosis of liver with ascites: Secondary | ICD-10-CM

## 2019-12-30 DIAGNOSIS — R188 Other ascites: Secondary | ICD-10-CM | POA: Diagnosis not present

## 2019-12-30 DIAGNOSIS — R69 Illness, unspecified: Secondary | ICD-10-CM | POA: Diagnosis not present

## 2019-12-30 HISTORY — PX: IR PARACENTESIS: IMG2679

## 2019-12-30 LAB — GRAM STAIN

## 2019-12-30 LAB — BODY FLUID CELL COUNT WITH DIFFERENTIAL
Eos, Fluid: 0 %
Lymphs, Fluid: 38 %
Monocyte-Macrophage-Serous Fluid: 31 % — ABNORMAL LOW (ref 50–90)
Neutrophil Count, Fluid: 31 % — ABNORMAL HIGH (ref 0–25)
Total Nucleated Cell Count, Fluid: 82 cu mm (ref 0–1000)

## 2019-12-30 MED ORDER — ALBUMIN HUMAN 25 % IV SOLN: 50.0000 g | Freq: Once | INTRAVENOUS | Status: AC

## 2019-12-30 MED ORDER — ALBUMIN HUMAN 25 % IV SOLN
INTRAVENOUS | Status: AC
  Administered 2019-12-30: 50 g via INTRAVENOUS
  Filled 2019-12-30: qty 200

## 2019-12-30 MED ORDER — LIDOCAINE HCL 1 % IJ SOLN
INTRAMUSCULAR | Status: AC
  Filled 2019-12-30: qty 20

## 2019-12-30 MED ORDER — LIDOCAINE HCL 1 % IJ SOLN
INTRAMUSCULAR | Status: DC | PRN
  Administered 2019-12-30: 10 mL

## 2019-12-30 NOTE — Telephone Encounter (Signed)
-----   Message from Roetta Sessions, [place] sent at 12/29/2019  5:27 PM EDT ----- Regarding: did she get a paracentesis last night? Patient missed her office visit today with Dr. Havery Moros.  She showed up 35 mins after her appointment and said she wanted a paracentesis.  Dr. Havery Moros said she would need to reschedule .  Called and scheduled patient for a paracentesis at [facility] tomorrow, Friday 7-9 at 1:00 pm.  Chart indicates that pt is now at the ER.  Will check the chart in the am to see if patient received a draw while in the ER. Otherwise will call pt and inform her of her appt tomorrow.

## 2019-12-30 NOTE — Telephone Encounter (Signed)
Called and spoke to pt's daughter, Katie Holland. Made her aware of her mother's appt for paracentesis today at [facility] at 1:00pm. She will make sure her mother is aware. She apologized for her mom missing yesterday's appt. Someone else drove her and Katie Holland assumed her mom could direct the person how to get to our office but she couldn't and they got lost. I reiterated the importance of her making all of her appts on time and getting to the lab when instructed.  She asked that we always contact her.  Pt is also due for RUQ Ultrasound. Scheduled pt for Thursday, 01-05-20 at 7:00am to arrive at 6:45am. NPO after midnight. Called and informed Katie Holland. She expressed understanding.

## 2019-12-30 NOTE — Telephone Encounter (Signed)
Thanks Jan 

## 2019-12-30 NOTE — Procedures (Signed)
PROCEDURE SUMMARY:  Successful image-guided paracentesis from the right lower abdomen.  Yielded 7.2 liters of clear yellow fluid.  No immediate complications.  EBL: zero Patient tolerated well.   Specimen was sent for labs.  Please see imaging section of Epic for full dictation.  Joaquim Nam PA-C 12/30/2019 1:15 PM

## 2020-01-02 LAB — CYTOLOGY - NON PAP

## 2020-01-04 LAB — CULTURE, BODY FLUID W GRAM STAIN -BOTTLE: Culture: NO GROWTH

## 2020-01-05 ENCOUNTER — Ambulatory Visit (HOSPITAL_COMMUNITY)
Admission: RE | Admit: 2020-01-05 | Discharge: 2020-01-05 | Disposition: A | Discharge status: Home / Self Care | Payer: Medicare HMO | Source: Ambulatory Visit | Attending: Gastroenterology | Admitting: Gastroenterology

## 2020-01-05 ENCOUNTER — Other Ambulatory Visit: Payer: Self-pay

## 2020-01-05 DIAGNOSIS — K7031 Alcoholic cirrhosis of liver with ascites: Secondary | ICD-10-CM

## 2020-01-05 DIAGNOSIS — R69 Illness, unspecified: Secondary | ICD-10-CM | POA: Diagnosis not present

## 2020-01-05 DIAGNOSIS — R188 Other ascites: Secondary | ICD-10-CM | POA: Diagnosis not present

## 2020-01-12 ENCOUNTER — Telehealth: Payer: Self-pay | Admitting: Gastroenterology

## 2020-01-12 NOTE — Telephone Encounter (Signed)
Patient calling to follow up states she is fully extended and does not want to go to ED

## 2020-01-12 NOTE — Telephone Encounter (Signed)
Pt would like to schedule paracentesis.

## 2020-01-12 NOTE — Telephone Encounter (Signed)
Yes okay to schedule outpatient large volume paracentesis first available to do this, albumin to be given if > 5 L removed.   Otherwise, please advise her that we don't do paracentesis in our office and it can take some time to get these coordinated. The ED is her only other option if she waits that long until she is very distended. She has continued to drink, has not taken her diuretics, and has not followed up with [place] in the office. This is not going to bet better and will likely worsen over time if she does not change her behavior / compliance with recommendations.

## 2020-01-12 NOTE — Telephone Encounter (Signed)
Last paracentesis was 7/15.  Okay to schedule?

## 2020-01-16 ENCOUNTER — Other Ambulatory Visit: Payer: Self-pay

## 2020-01-16 ENCOUNTER — Ambulatory Visit (HOSPITAL_COMMUNITY)
Admission: RE | Admit: 2020-01-16 | Discharge: 2020-01-16 | Disposition: A | Discharge status: Home / Self Care | Payer: Medicare HMO | Source: Ambulatory Visit | Attending: Gastroenterology | Admitting: Gastroenterology

## 2020-01-16 DIAGNOSIS — K7031 Alcoholic cirrhosis of liver with ascites: Secondary | ICD-10-CM | POA: Diagnosis present

## 2020-01-16 DIAGNOSIS — R69 Illness, unspecified: Secondary | ICD-10-CM | POA: Diagnosis not present

## 2020-01-16 HISTORY — PX: IR PARACENTESIS: IMG2679

## 2020-01-16 LAB — BODY FLUID CELL COUNT WITH DIFFERENTIAL
Eos, Fluid: 0 %
Lymphs, Fluid: 21 %
Monocyte-Macrophage-Serous Fluid: 54 % (ref 50–90)
Neutrophil Count, Fluid: 25 % (ref 0–25)
Total Nucleated Cell Count, Fluid: 62 cu mm (ref 0–1000)

## 2020-01-16 MED ORDER — ALBUMIN HUMAN 25 % IV SOLN
INTRAVENOUS | Status: AC
  Filled 2020-01-16: qty 200

## 2020-01-16 MED ORDER — LIDOCAINE HCL 1 % IJ SOLN
INTRAMUSCULAR | Status: AC
  Filled 2020-01-16: qty 20

## 2020-01-16 MED ORDER — LIDOCAINE HCL 1 % IJ SOLN
INTRAMUSCULAR | Status: DC | PRN
  Administered 2020-01-16: 10 mL

## 2020-01-16 NOTE — Telephone Encounter (Signed)
Spoke with pt and was informed she will go to the ED.

## 2020-01-16 NOTE — Procedures (Signed)
PROCEDURE SUMMARY:  Successful US guided paracentesis from RLQ.  Yielded 7.3 L of clear yellow fluid.  No immediate complications.  Pt tolerated well.   Specimen was sent for labs.  EBL < 43mL  Ascencion Dike PA-C 01/16/2020 1:08 PM

## 2020-01-18 LAB — PATHOLOGIST SMEAR REVIEW

## 2020-01-26 ENCOUNTER — Telehealth: Payer: Self-pay | Admitting: Gastroenterology

## 2020-01-26 NOTE — Telephone Encounter (Signed)
Pls call pt, she needs to sch paracentesis.

## 2020-01-26 NOTE — Telephone Encounter (Signed)
Spoke with patient, patient states that she had a paracentesis last week at [facility] and wanted to come in for a follow up with Dr. Havery Moros, pt states that she will try to manage the best she can. Pt advised to call office back if she had any other concerns. Pt scheduled for follow up with Dr. Havery Moros on 02/06/20 at 1:00 pm, pt advised to arrive a little early to check in.

## 2020-02-06 ENCOUNTER — Encounter: Payer: Self-pay | Admitting: Gastroenterology

## 2020-02-06 ENCOUNTER — Ambulatory Visit (INDEPENDENT_AMBULATORY_CARE_PROVIDER_SITE_OTHER): Payer: Medicare HMO | Admitting: Gastroenterology

## 2020-02-06 ENCOUNTER — Other Ambulatory Visit (INDEPENDENT_AMBULATORY_CARE_PROVIDER_SITE_OTHER): Payer: Medicare HMO

## 2020-02-06 VITALS — BP 120/80 | HR 96 | Ht 64.0 in | Wt 137.6 lb

## 2020-02-06 DIAGNOSIS — K7682 Hepatic encephalopathy: Secondary | ICD-10-CM

## 2020-02-06 DIAGNOSIS — K7031 Alcoholic cirrhosis of liver with ascites: Secondary | ICD-10-CM

## 2020-02-06 DIAGNOSIS — R69 Illness, unspecified: Secondary | ICD-10-CM | POA: Diagnosis not present

## 2020-02-06 DIAGNOSIS — I8500 Esophageal varices without bleeding: Secondary | ICD-10-CM | POA: Diagnosis not present

## 2020-02-06 DIAGNOSIS — K7290 Hepatic failure, unspecified without coma: Secondary | ICD-10-CM | POA: Diagnosis not present

## 2020-02-06 LAB — BASIC METABOLIC PANEL
BUN: 9 mg/dL (ref 6–23)
CO2: 26 mEq/L (ref 19–32)
Calcium: 9.2 mg/dL (ref 8.4–10.5)
Chloride: 102 mEq/L (ref 96–112)
Creatinine, Ser: 0.63 mg/dL (ref 0.40–1.20)
GFR: 118.67 mL/min (ref >60.00)
Glucose, Bld: 78 mg/dL (ref 70–99)
Potassium: 3.3 mEq/L — ABNORMAL LOW (ref 3.5–5.1)
Sodium: 137 mEq/L (ref 135–145)

## 2020-02-06 LAB — PROTIME-INR
INR: 1.4 ratio — ABNORMAL HIGH (ref 0.8–1.0)
Prothrombin Time: 15.3 s — ABNORMAL HIGH (ref 9.6–13.1)

## 2020-02-06 MED ORDER — RIFAXIMIN 550 MG PO TABS: 550.0000 mg | ORAL_TABLET | Freq: Two times a day (BID) | ORAL | 1 refills | Status: DC

## 2020-02-06 MED ORDER — FUROSEMIDE 20 MG PO TABS: 20.0000 mg | ORAL_TABLET | Freq: Two times a day (BID) | ORAL | 1 refills | Status: DC

## 2020-02-06 NOTE — Progress Notes (Signed)
a  HPI :  54 y/o female here for a follow up visit for decompensated alcoholic cirrhosis. Course complicated by ascites and bleeding esophageal varices, as well as hepatic encephalopathy. She has had a few admissions for variceal bleeding at the end of 2019, she has required over 25 bands placed since November 2019 for eradication of her varices.  Her last endoscopy was in Feb 2021 at which time she had no banding needed. She has not been on a beta-blocker due to difficulty managing her ascites.  She is accompanied by her husband today, this is the first time I have met him.  She has had refractory ascites as she has had historically compliance issues with diuretics and she has continued to drink alcohol over the past few years since her diagnosis, she has not been able to maintain sobriety.  I have had several discussions with her and her daughter about this.  She continues to require paracentesis periodically for reaccumulation of her ascites.  She has not been taking any of her diuretic medications however states she is now ready to take them reliable as she states when her studies get 10 she does have some shortness of breath with this.  She had her Aldactone refilled recently but has not taken it, she has no Lasix from what she says.  She has been following a low-sodium diet.  She does say she has been alcohol free for the past month or so, her last drink was a wine cooler about 1 month ago, adamantly denies any alcohol use since that time.  She has been evaluated for TIPS in the past and discussed her case with hepatology, they felt she was too high risk for TIPS due to her poor compliance with recommendations for her alcohol use and medications, she is also had a history of encephalopathy.  She denies any lower extremity edema.  No problems with jaundice.  Her husband does say she is forgetful at times and can be confused.  She is not taking lactulose or rifaximin.  She denies any abdominal pains at  all.  She had an ultrasound done in July for [facility] screening that was negative.  Of note she had a screening colonoscopy performed at the time of her last endoscopy, no high risk lesions noted however the prep was only fair.  Of note she had a questionable nodular-appearing omentum with an elevated CA-125 level. Ascitic cytology 2 has been negative. She had a CT scan which did not show any problems in the pelvis her ovaries. She has no evidence of HCC.  Most recent endoscopies: EGD 07/07/18 - 4 bands placed EGD 07/27/18 - 3 bands placed EGD 08/30/18 - 3 bands placed EGD 11/08/18 -no varices amenable to banding, portal hypertensive gastritis EGD 01/31/19 - portal hypertensive gastritis, no varices  CT angio abdomen 01/01/19 - large varices noted on imaging, cirrhosis with ascites, R pleural effusion  EGD 08/01/19 - There was extensive scarring in the distal esophagus secondary to prior banding, but no obvious varices noted today. The exam of the esophagus was otherwise normal. Diffuse severely congested mucosa was found in the gastric antrum secondary to portal hypertension.. The exam of the stomach was otherwise normal. No gastric varices. The duodenal bulb and second portion of the duodenum were normal.    Colonoscopy 08/01/19 - Preparation of the colon was fair - most of the colon was well visualized - a small portion of the transverse colon, and then the splenic flexure / proximal descending  colon had some residual stool that could not be cleared but nothing obvious noted in these areas. - Hemorrhoids found on perianal exam. - The examined portion of the ileum was normal. - Diffuse colonic congestion / edema in the entire examined colon from portal hypertension. - Internal hemorrhoids. - The examination was otherwise normal.  [place] 01/05/20 - IMPRESSION: 1. Advanced changes of cirrhosis. No focal liver abnormality to suggest hepatoma. 2. Large volume of ascites and right pleural  effusion 3. Gallbladder wall thickening is likely secondary to cirrhosis.       Past Medical History:  Diagnosis Date  . Alcoholic liver disease ([place])   . Chronic back pain    on disability  . Cirrhosis of liver ([place])   . Esophageal varices ([place])   . GERD (gastroesophageal reflux disease)   . Headache   . Jaundice      Past Surgical History:  Procedure Laterality Date  . BACK SURGERY     L3-L4  . COLONOSCOPY WITH PROPOFOL N/A 08/01/2019   Procedure: COLONOSCOPY WITH PROPOFOL;  Surgeon: Yetta Flock, MD;  Location: [facility] ENDOSCOPY;  Service: Gastroenterology;  Laterality: N/A;  . ESOPHAGEAL BANDING  04/25/2018   Procedure: ESOPHAGEAL BANDING;  Surgeon: Yetta Flock, MD;  Location: [facility] ENDOSCOPY;  Service: Gastroenterology;;  . ESOPHAGEAL BANDING  04/27/2018   Procedure: ESOPHAGEAL BANDING;  Surgeon: Milus Banister, MD;  Location: [place];  Service: Endoscopy;;  . ESOPHAGEAL BANDING  06/02/2018   Procedure: ESOPHAGEAL BANDING;  Surgeon: Doran Stabler, MD;  Location: [place];  Service: Gastroenterology;;  . ESOPHAGEAL BANDING N/A 07/07/2018   Procedure: ESOPHAGEAL BANDING;  Surgeon: Yetta Flock, MD;  Location: [facility] ENDOSCOPY;  Service: Gastroenterology;  Laterality: N/A;  . ESOPHAGEAL BANDING  07/27/2018   Procedure: ESOPHAGEAL BANDING;  Surgeon: Yetta Flock, MD;  Location: [facility] ENDOSCOPY;  Service: Gastroenterology;;  . ESOPHAGEAL BANDING  08/30/2018   Procedure: ESOPHAGEAL BANDING;  Surgeon: Yetta Flock, MD;  Location: [facility] ENDOSCOPY;  Service: Gastroenterology;;  . ESOPHAGOGASTRODUODENOSCOPY N/A 04/27/2018   Procedure: ESOPHAGOGASTRODUODENOSCOPY (EGD);  Surgeon: Milus Banister, MD;  Location[facility] ENDOSCOPY;  Service: Endoscopy;  Laterality: N/A;  . ESOPHAGOGASTRODUODENOSCOPY (EGD) WITH PROPOFOL N/A 04/25/2018   Procedure: ESOPHAGOGASTRODUODENOSCOPY (EGD) WITH PROPOFOL;  Surgeon: Yetta Flock, MD;  Location: [place];   Service: Gastroenterology;  Laterality: N/A;  . ESOPHAGOGASTRODUODENOSCOPY (EGD) WITH PROPOFOL N/A 06/02/2018   Procedure: ESOPHAGOGASTRODUODENOSCOPY (EGD) WITH PROPOFOL;  Surgeon: Doran Stabler, MD;  Location: [place];  Service: Gastroenterology;  Laterality: N/A;  . ESOPHAGOGASTRODUODENOSCOPY (EGD) WITH PROPOFOL N/A 07/07/2018   Procedure: ESOPHAGOGASTRODUODENOSCOPY (EGD) WITH PROPOFOL;  Surgeon: Yetta Flock, MD;  [facility];  Service: Gastroenterology;  Laterality: N/A;  . ESOPHAGOGASTRODUODENOSCOPY (EGD) WITH PROPOFOL N/A 07/27/2018   Procedure: ESOPHAGOGASTRODUODENOSCOPY (EGD) WITH PROPOFOL;  Surgeon: Yetta Flock, MD;  [facility];  Service: Gastroenterology;  Laterality: N/A;  . ESOPHAGOGASTRODUODENOSCOPY (EGD) WITH PROPOFOL N/A 08/30/2018   Procedure: ESOPHAGOGASTRODUODENOSCOPY (EGD) WITH PROPOFOL;  Surgeon: Yetta Flock, MD;  [facility];  Service: Gastroenterology;  Laterality: N/A;  . ESOPHAGOGASTRODUODENOSCOPY (EGD) WITH PROPOFOL N/A 11/08/2018   Procedure: ESOPHAGOGASTRODUODENOSCOPY (EGD) WITH PROPOFOL;  Surgeon: Yetta Flock, MD;  [facility];  Service: Gastroenterology;  Laterality: N/A;  . ESOPHAGOGASTRODUODENOSCOPY (EGD) WITH PROPOFOL N/A 01/31/2019   Procedure: ESOPHAGOGASTRODUODENOSCOPY (EGD) WITH PROPOFOL;  Surgeon: Yetta Flock, MD;  [facility];  Service: Gastroenterology;  Laterality: N/A;  . ESOPHAGOGASTRODUODENOSCOPY (EGD) WITH PROPOFOL N/A 08/01/2019  Procedure: ESOPHAGOGASTRODUODENOSCOPY (EGD) WITH PROPOFOL;  Surgeon: Yetta Flock, MD;  Location: [facility] ENDOS[facility];  Service: Gastroenterology;  Laterality: N/A;  . IR PARACENTESIS  04/26/2018  . IR PARACENTESIS  05/18/2018  . IR PARACENTESIS  05/31/2018  . IR PARACENTESIS  06/30/2018  . IR PARACENTESIS  08/05/2018  . IR PARACENTESIS  08/31/2018  . IR PARACENTESIS  10/08/2018  . IR PARACENTESIS  10/28/2018  . IR PARACENTESIS   02/14/2019  . IR PARACENTESIS  03/09/2019  . IR PARACENTESIS  04/05/2019  . IR PARACENTESIS  05/03/2019  . IR PARACENTESIS  06/20/2019  . IR PARACENTESIS  07/19/2019  . IR PARACENTESIS  08/19/2019  . IR PARACENTESIS  09/27/2019  . IR PARACENTESIS  10/07/2019  . IR PARACENTESIS  10/28/2019  . IR PARACENTESIS  11/16/2019  . IR PARACENTESIS  12/30/2019  . IR PARACENTESIS  01/16/2020  . IR RADIOLOGIST EVAL & MGMT  12/23/2018  . IR THORACENTESIS ASP PLEURAL SPACE W/IMG GUIDE  12/10/2018  . IR THORACENTESIS ASP PLEURAL SPACE W/IMG GUIDE  01/21/2019   Family History  Problem Relation Age of Onset  . Colon cancer Neg Hx   . Esophageal cancer Neg Hx   . Pancreatic cancer Neg Hx   . Stomach cancer Neg Hx    Social History   Tobacco Use  . Smoking status: Current Some Day Smoker    Packs/day: 1.00    Years: 20.00    Pack years: 20.00    Types: Cigarettes  . Smokeless tobacco: Never Used  . Tobacco comment: 5 a day  Vaping Use  . Vaping Use: Never used  Substance Use Topics  . Alcohol use: Not Currently    Comment: drinks a beer every few days   . Drug use: Not Currently   Current Outpatient Medications  Medication Sig Dispense Refill  . furosemide (LASIX) 20 MG tablet Take 1 tablet (20 mg total) by mouth 2 (two) times daily. 180 tablet 3  . Multiple Vitamin (MULTIVITAMIN WITH MINERALS) TABS tablet Take 1 tablet by mouth.     . potassium chloride SA (KLOR-CON) 20 MEQ tablet Take 1 tablet (20 mEq total) by mouth 2 (two) times daily. 10 tablet 0  . rifaximin (XIFAXAN) 550 MG TABS tablet Take 1 tablet (550 mg total) by mouth 2 (two) times daily. 60 tablet 5  . spironolactone (ALDACTONE) 100 MG tablet Take 100 mg by mouth daily.     No current facility-administered medications for this visit.   Facility-Administered Medications Ordered in Other Visits  Medication Dose Route Frequency Provider Last Rate Last Admin  . lactated ringers infusion    Continuous PRN Leonor Liv, CRNA   New Bag at  03/09/19 0800  . lactated ringers infusion    Continuous PRN Leonor Liv, CRNA   New Bag at 03/09/19 5625   Allergies  Allergen Reactions  . Contrast Media [Iodinated Diagnostic Agents] Itching and Swelling    Needs 13-hr prep  . Latex Itching, Swelling and Other (See Comments)    No breathing impairment, however  . Sulfa Antibiotics Itching, Swelling and Other (See Comments)    No breathing impairment, however     Review of Systems: All systems reviewed and negative except where noted in HPI.    IR Paracentesis  Result Date: 01/16/2020 INDICATION: Patient with history of alcoholic cirrhosis and recurrent ascites. Request for diagnostic and therapeutic paracentesis. EXAM: ULTRASOUND GUIDED RIGHT LOWER QUADRANT PARACENTESIS MEDICATIONS: None. COMPLICATIONS: None immediate. PROCEDURE: Informed written consent was obtained from  the patient after a discussion of the risks, benefits and alternatives to treatment. A timeout was performed prior to the initiation of the procedure. Initial ultrasound scanning demonstrates a large amount of ascites within the right lower abdominal quadrant. The right lower abdomen was prepped and draped in the usual sterile fashion. 1% lidocaine was used for local anesthesia. Following this, a 19 gauge, 7-cm, Yueh catheter was introduced. An ultrasound image was saved for documentation purposes. The paracentesis was performed. The catheter was removed and a dressing was applied. The patient tolerated the procedure well without immediate post procedural complication. FINDINGS: A total of approximately 7.3 L of clear yellow fluid was removed. IMPRESSION: Successful ultrasound-guided paracentesis yielding 7.3 liters of peritoneal fluid. Read by: Ascencion Dike PA-C Electronically Signed   By: Corrie Mckusick D.O.   On: 01/16/2020 13:09   Lab Results  Component Value Date   WBC 3.4 (L) 12/07/2019   HGB 10.7 (L) 12/07/2019   HCT 33.2 (L) 12/07/2019   MCV 85.1 12/07/2019    PLT 146 (L) 12/07/2019    Lab Results  Component Value Date   CREATININE 0.63 02/06/2020   BUN 9 02/06/2020   NA 137 02/06/2020   K 3.3 (L) 02/06/2020   CL 102 02/06/2020   CO2 26 02/06/2020   Lab Results  Component Value Date   ALT 14 12/07/2019   AST 31 12/07/2019   ALKPHOS 113 12/07/2019   BILITOT 2.4 (H) 12/07/2019    Lab Results  Component Value Date   INR 1.4 (H) 02/06/2020   INR 1.4 (H) 04/29/2019   INR 1.6 (H) 03/09/2019    No results found for: AFP  Physical Exam: BP 120/80   Pulse 96   Ht _0  (1.626 m)   Wt 137 lb 9.6 oz (62.4 kg)   BMI 23.62 kg/m  Constitutional: Pleasant,well-developed, female in no acute distress. Cardiovascular: Normal rate, regular rhythm.  Pulmonary/chest: Effort normal and breath sounds normal on left, decreased on R base Abdominal: Soft, distended with ascites, nontender.  Extremities: no edema Lymphadenopathy: No cervical adenopathy noted. Neurological: Alert and oriented to person place and time. Skin: Skin is warm and dry. No rashes noted. Psychiatric: Normal mood and affect. Behavior is normal.   ASSESSMENT AND PLAN: 54 year old female here for reassessment of the following issues:  Alcoholic cirrhosis / ascites / esophageal varices / hepatic encephalopathy - as above, accompanied by her husband today.  Since I have met her she has not been able to comply with alcohol abstinence nor take any of her medications.  Since her last visit it appears she is motivated to stop drinking and is adamant along with her husband today that she has not had any alcohol for the past month which is excellent news.  She states she is now ready to resume her diuretics and be compliant with them given the ascites is quite bothersome to her and she requires recurrent paracentesis.  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  We will schedule for large-volume  paracentesis.  I outlined to her previously that she is not a candidate for TIPS due to her compliance issues and she must fail high-dose diuretics which she has not been compliant with in the past.  She seems motivated to stay on the diuretics, will resume Aldactone 100 mg a day and Lasix 20 mg twice daily today.  She will be due for be met in  2 weeks.  We will repeat her INR and AFP today.  [facility] screening up-to-date from July.  She is due for an EGD within the next 6 months or so for varices surveillance.  I will refill her rifaximin for hepatic encephalopathy and she can also take lactulose however compliance will be a major issue with lactulose and I do not think she will take it.  I counseled her that alcohol abstinence is paramount, she is not a candidate for transplant without doing this and I am afraid her long-term prognosis is extremely poor she continues to drink.  This was all explained to her, risks of further decompensation are high with continued alcohol use especially if she is not a transplant candidate.  He verbalized understanding, agreed with the plan, we will see her again in 3 months, hopefully she can continue to comply with my recommendations.  If she can comply with alcohol abstinence I will refer her back to hepatology for transplant evaluation.  [place] Cellar, MD [facility] Gastroenterology

## 2020-02-06 NOTE — Patient Instructions (Addendum)
If you are age 55 or older, your body mass index should be between 23-30. Your Body mass index is 23.62 kg/m. If this is out of the aforementioned range listed, please consider follow up with your Primary Care Provider.  If you are age 8 or younger, your body mass index should be between 19-25. Your Body mass index is 23.62 kg/m. If this is out of the aformentioned range listed, please consider follow up with your Primary Care Provider.   Please go to the lab in the basement of our building to have lab work done as you leave today. Hit "B" for basement when you get on the elevator.  When the doors open the lab is on your left.  We will call you with the results. Thank you.  Due to recent changes in healthcare laws, you may see the results of your imaging and laboratory studies on MyChart before your provider has had a chance to review them.  We understand that in some cases there may be results that are confusing or concerning to you. Not all laboratory results come back in the same time frame and the provider may be waiting for multiple results in order to interpret others.  Please give [place] 48 hours in order for your provider to thoroughly review all the results before contacting the office for clarification of your results.   Please go to the lab in 2 weeks (week of August 30th) for lab work Artist.  We have sent the following medications to your pharmacy for you to pick up at your convenience: Xifaxan 550 mg: Take twice a day Lasix: 20 mg: Take twice a day  You have been scheduled for an abdominal paracentesis at [facility] radiology (1st floor of hospital) on Tuesday, 02-07-20 at 10:00am. Please arrive at least 15 minutes prior to your appointment time for registration. Should you need to reschedule this appointment for any reason, please call our office at [phone].  You will be due for a follow up appointment in 3 months. We will let you know when it is time to schedule when the schedule  opens up.   Thank you for entrusting me with your care and for choosing [facility], Dr. [place] Cellar

## 2020-02-07 ENCOUNTER — Other Ambulatory Visit: Payer: Self-pay

## 2020-02-07 ENCOUNTER — Ambulatory Visit (HOSPITAL_COMMUNITY)
Admission: RE | Admit: 2020-02-07 | Discharge: 2020-02-07 | Disposition: A | Discharge status: Home / Self Care | Payer: Medicare HMO | Source: Ambulatory Visit | Attending: Gastroenterology | Admitting: Gastroenterology

## 2020-02-07 DIAGNOSIS — K7290 Hepatic failure, unspecified without coma: Secondary | ICD-10-CM

## 2020-02-07 DIAGNOSIS — I8500 Esophageal varices without bleeding: Secondary | ICD-10-CM | POA: Insufficient documentation

## 2020-02-07 DIAGNOSIS — K7031 Alcoholic cirrhosis of liver with ascites: Secondary | ICD-10-CM | POA: Insufficient documentation

## 2020-02-07 DIAGNOSIS — R69 Illness, unspecified: Secondary | ICD-10-CM | POA: Diagnosis not present

## 2020-02-07 DIAGNOSIS — K7682 Hepatic encephalopathy: Secondary | ICD-10-CM

## 2020-02-07 HISTORY — PX: IR PARACENTESIS: IMG2679

## 2020-02-07 LAB — BODY FLUID CELL COUNT WITH DIFFERENTIAL
Lymphs, Fluid: 9 %
Monocyte-Macrophage-Serous Fluid: 61 % (ref 50–90)
Neutrophil Count, Fluid: 30 % — ABNORMAL HIGH (ref 0–25)
Total Nucleated Cell Count, Fluid: 116 uL (ref 0–1000)

## 2020-02-07 LAB — AFP TUMOR MARKER: AFP-Tumor Marker: 1.8 ng/mL

## 2020-02-07 MED ORDER — LIDOCAINE HCL 1 % IJ SOLN
INTRAMUSCULAR | Status: AC
  Filled 2020-02-07: qty 20

## 2020-02-07 MED ORDER — LIDOCAINE HCL 1 % IJ SOLN
INTRAMUSCULAR | Status: AC | PRN
  Administered 2020-02-07: 10 mL

## 2020-02-07 MED ORDER — ALBUMIN HUMAN 25 % IV SOLN
INTRAVENOUS | Status: AC
  Filled 2020-02-07: qty 200

## 2020-02-07 MED ORDER — ALBUMIN HUMAN 25 % IV SOLN
50.0000 g | Freq: Once | INTRAVENOUS | Status: AC
  Administered 2020-02-07: 50 g via INTRAVENOUS

## 2020-02-07 NOTE — Procedures (Signed)
Ultrasound-guided diagnostic and therapeutic paracentesis performed yielding 6.4 liters of straw colored fluid.  Fluid was sent to lab for analysis. No immediate complications. EBL is none.

## 2020-02-07 NOTE — Progress Notes (Signed)
bmet ordered for 2 weeks

## 2020-02-09 LAB — PATHOLOGIST SMEAR REVIEW

## 2020-02-28 ENCOUNTER — Other Ambulatory Visit (INDEPENDENT_AMBULATORY_CARE_PROVIDER_SITE_OTHER): Payer: Medicare HMO

## 2020-02-28 DIAGNOSIS — K7031 Alcoholic cirrhosis of liver with ascites: Secondary | ICD-10-CM | POA: Diagnosis not present

## 2020-02-28 DIAGNOSIS — R69 Illness, unspecified: Secondary | ICD-10-CM | POA: Diagnosis not present

## 2020-02-28 LAB — BASIC METABOLIC PANEL
BUN: 9 mg/dL (ref 6–23)
CO2: 23 mEq/L (ref 19–32)
Calcium: 8.9 mg/dL (ref 8.4–10.5)
Chloride: 105 mEq/L (ref 96–112)
Creatinine, Ser: 0.69 mg/dL (ref 0.40–1.20)
GFR: 106.82 mL/min (ref >60.00)
Glucose, Bld: 90 mg/dL (ref 70–99)
Potassium: 3.4 mEq/L — ABNORMAL LOW (ref 3.5–5.1)
Sodium: 139 mEq/L (ref 135–145)

## 2020-03-01 ENCOUNTER — Other Ambulatory Visit: Payer: Self-pay

## 2020-03-01 DIAGNOSIS — K7682 Hepatic encephalopathy: Secondary | ICD-10-CM

## 2020-03-01 DIAGNOSIS — K7031 Alcoholic cirrhosis of liver with ascites: Secondary | ICD-10-CM

## 2020-03-01 MED ORDER — FUROSEMIDE 20 MG PO TABS: 40.0000 mg | ORAL_TABLET | Freq: Two times a day (BID) | ORAL | 0 refills | Status: DC

## 2020-03-01 MED ORDER — SPIRONOLACTONE 100 MG PO TABS: 200.0000 mg | ORAL_TABLET | Freq: Every day | ORAL | 0 refills | Status: DC

## 2020-03-01 NOTE — Progress Notes (Unsigned)
Paracentesis orders entered.  Patient scheduled for paracentesis at [facility] on Tuesday, 9-14 at 1:00pm.  Arrive at 12:45pm.

## 2020-03-06 ENCOUNTER — Other Ambulatory Visit: Payer: Self-pay

## 2020-03-06 ENCOUNTER — Ambulatory Visit (HOSPITAL_COMMUNITY)
Admission: RE | Admit: 2020-03-06 | Discharge: 2020-03-06 | Disposition: A | Discharge status: Home / Self Care | Payer: Medicare HMO | Source: Ambulatory Visit | Attending: Gastroenterology | Admitting: Gastroenterology

## 2020-03-06 DIAGNOSIS — K7290 Hepatic failure, unspecified without coma: Secondary | ICD-10-CM | POA: Insufficient documentation

## 2020-03-06 DIAGNOSIS — K7031 Alcoholic cirrhosis of liver with ascites: Secondary | ICD-10-CM | POA: Diagnosis present

## 2020-03-06 DIAGNOSIS — K7682 Hepatic encephalopathy: Secondary | ICD-10-CM

## 2020-03-06 DIAGNOSIS — R69 Illness, unspecified: Secondary | ICD-10-CM | POA: Diagnosis not present

## 2020-03-06 HISTORY — PX: IR PARACENTESIS: IMG2679

## 2020-03-06 LAB — BODY FLUID CELL COUNT WITH DIFFERENTIAL
Eos, Fluid: 0 %
Lymphs, Fluid: 25 %
Monocyte-Macrophage-Serous Fluid: 59 % (ref 50–90)
Neutrophil Count, Fluid: 16 % (ref 0–25)
Total Nucleated Cell Count, Fluid: 74 cu mm (ref 0–1000)

## 2020-03-06 MED ORDER — ALBUMIN HUMAN 25 % IV SOLN
50.0000 g | Freq: Once | INTRAVENOUS | Status: DC
  Filled 2020-03-06: qty 200

## 2020-03-06 MED ORDER — LIDOCAINE HCL (PF) 1 % IJ SOLN
INTRAMUSCULAR | Status: AC | PRN
  Administered 2020-03-06: 10 mL

## 2020-03-06 MED ORDER — ALBUMIN HUMAN 25 % IV SOLN
INTRAVENOUS | Status: AC
  Administered 2020-03-06: 75 g via INTRAVENOUS
  Filled 2020-03-06: qty 300

## 2020-03-06 MED ORDER — ALBUMIN HUMAN 25 % IV SOLN: 75.0000 g | Freq: Once | INTRAVENOUS | Status: AC

## 2020-03-06 MED ORDER — LIDOCAINE HCL 1 % IJ SOLN
INTRAMUSCULAR | Status: AC
  Filled 2020-03-06: qty 20

## 2020-03-07 LAB — PATHOLOGIST SMEAR REVIEW

## 2020-04-02 ENCOUNTER — Telehealth: Payer: Self-pay | Admitting: Gastroenterology

## 2020-04-02 DIAGNOSIS — K7031 Alcoholic cirrhosis of liver with ascites: Secondary | ICD-10-CM

## 2020-04-02 NOTE — Telephone Encounter (Signed)
Patient is requesting a paracentesis. Last one ws 03-06-20 with over 10L removed.  Last BMET 02-28-20.  Ok to schedule?

## 2020-04-03 NOTE — Telephone Encounter (Signed)
Okay thanks Jan. If that is the case I would recommend stopping her diuretics given the compliance issues, risks outweigh benefits. Thanks

## 2020-04-03 NOTE — Telephone Encounter (Signed)
Called and spoke to Katie Holland, pt's daughter. She told me that her mom has the medication (lasix and aldactone) but she is just refusing to take it and she is continuing to drink alcohol. I layed out the options that Dr. Havery Moros indicates she has and she will discuss with her mom tonight.  She indicates they have had this conversation many times over many years and her mom does not do what she says she will.  Katie Holland indicated she or her step father will make sure she gets to the lab tomorrow for BMET and to her paracentesis at [facility] tomorrow afternoon.

## 2020-04-03 NOTE — Telephone Encounter (Signed)
Thanks Jan. Yes we can do para, give albumin for > 5 L removed. Otherwise when we last spoke with her I had recommend changing her lasix to 40mg  BID and aldactone to 200mg  / day, with the caveat that if she did this should would have to go to the lab in one week. I don't see that she went to the lab. Can you clarify if she changed her diuretic dosing and what she is taking? She needs to go for another BMET this week. Thanks

## 2020-04-03 NOTE — Telephone Encounter (Signed)
Scheduled patient for paracentesis at [facility] for tomorrow,  Wednesday, 10-13 at 1:00pm. To Arrive 15 mins early. Called and spoke to pt. She has run out of lasix and has not picked up refill.  She said she is taking aldactone 100 mg once daily. I discussed with her that she was supposed to be taking lasix 40 mg bid and 200 mg of aldactone daily.  She had also assured [place] she would go to the lab last month but she did not. She acted unsure of all of this. Called and Left detailed message for pt's daughter [name].  Asked her to call me to discuss getting the patient to the lab, to the pharmacy AND to her paracentesis tomorrow.  Pt offered that her xhusband Joneen Boers: [phone] may also be able to help get her transported tomorrow.

## 2020-04-03 NOTE — Telephone Encounter (Signed)
Jan if she is not taking the diuretics as recommended nor following up with the lab she should stop the diuretics and we can manage this with paracentesis but she will need it much more frequently. Up to her. She is putting herself at risk not taking medications as recommended nor going to the lab as recommended

## 2020-04-04 ENCOUNTER — Ambulatory Visit (HOSPITAL_COMMUNITY)
Admission: RE | Admit: 2020-04-04 | Discharge: 2020-04-04 | Disposition: A | Discharge status: Home / Self Care | Payer: Medicare HMO | Source: Ambulatory Visit | Attending: Gastroenterology | Admitting: Gastroenterology

## 2020-04-04 ENCOUNTER — Other Ambulatory Visit: Payer: Self-pay

## 2020-04-04 ENCOUNTER — Other Ambulatory Visit (INDEPENDENT_AMBULATORY_CARE_PROVIDER_SITE_OTHER): Payer: Medicare HMO

## 2020-04-04 DIAGNOSIS — K7031 Alcoholic cirrhosis of liver with ascites: Secondary | ICD-10-CM | POA: Insufficient documentation

## 2020-04-04 DIAGNOSIS — R188 Other ascites: Secondary | ICD-10-CM | POA: Diagnosis not present

## 2020-04-04 DIAGNOSIS — R69 Illness, unspecified: Secondary | ICD-10-CM | POA: Diagnosis not present

## 2020-04-04 DIAGNOSIS — K7682 Hepatic encephalopathy: Secondary | ICD-10-CM

## 2020-04-04 DIAGNOSIS — K7290 Hepatic failure, unspecified without coma: Secondary | ICD-10-CM

## 2020-04-04 HISTORY — PX: IR PARACENTESIS: IMG2679

## 2020-04-04 LAB — BASIC METABOLIC PANEL
BUN: 8 mg/dL (ref 6–23)
CO2: 25 mEq/L (ref 19–32)
Calcium: 8.6 mg/dL (ref 8.4–10.5)
Chloride: 107 mEq/L (ref 96–112)
Creatinine, Ser: 0.58 mg/dL (ref 0.40–1.20)
GFR: 103.57 mL/min (ref >60.00)
Glucose, Bld: 82 mg/dL (ref 70–99)
Potassium: 3.4 mEq/L — ABNORMAL LOW (ref 3.5–5.1)
Sodium: 138 mEq/L (ref 135–145)

## 2020-04-04 LAB — BODY FLUID CELL COUNT WITH DIFFERENTIAL
Eos, Fluid: 0 %
Lymphs, Fluid: 20 %
Monocyte-Macrophage-Serous Fluid: 71 % (ref 50–90)
Neutrophil Count, Fluid: 9 % (ref 0–25)
Total Nucleated Cell Count, Fluid: 83 cu mm (ref 0–1000)

## 2020-04-04 MED ORDER — LIDOCAINE HCL 1 % IJ SOLN
INTRAMUSCULAR | Status: AC
  Filled 2020-04-04: qty 20

## 2020-04-04 MED ORDER — ALBUMIN HUMAN 25 % IV SOLN
INTRAVENOUS | Status: AC
  Administered 2020-04-04: 50 g via INTRAVENOUS
  Filled 2020-04-04: qty 200

## 2020-04-04 MED ORDER — ALBUMIN HUMAN 25 % IV SOLN: 50.0000 g | Freq: Once | INTRAVENOUS | Status: AC

## 2020-04-04 MED ORDER — LIDOCAINE HCL (PF) 1 % IJ SOLN
INTRAMUSCULAR | Status: DC | PRN
  Administered 2020-04-04: 10 mL

## 2020-04-04 NOTE — Procedures (Signed)
Ultrasound-guided diagnostic and therapeutic paracentesis performed yielding 8 liters of yellow fluid. No immediate complications. A portion of the fluid was sent to the lab for preordered studies. Pt will receive IV albumin postprocedure.  EBL none.

## 2020-04-05 LAB — PATHOLOGIST SMEAR REVIEW

## 2020-04-16 ENCOUNTER — Other Ambulatory Visit: Payer: Self-pay

## 2020-04-16 ENCOUNTER — Telehealth: Payer: Self-pay | Admitting: Gastroenterology

## 2020-04-16 DIAGNOSIS — K7031 Alcoholic cirrhosis of liver with ascites: Secondary | ICD-10-CM

## 2020-04-16 NOTE — Telephone Encounter (Signed)
Patient requesting a paracentesis. 8L removed on 04/04/20, last BMET on 04/04/20 as well. Please advise, thank you

## 2020-04-16 NOTE — Telephone Encounter (Signed)
Paracentesis ordered.  Patient has been scheduled at [facility] on 04/17/20 at 1 pm, patient is to arrive at 12:45 PM.   Spoke with patient and she is aware of paracentesis appt for tomorrow at [facility]. Patient states that she is not taking any diuretics at this time, she is aware that she will not need labs. Pt verbalized understanding of all instructions and had no concerns at the end of the call.

## 2020-04-16 NOTE — Telephone Encounter (Signed)
We can do another paracentesis, if > 5 L then give albumin.  Otherwise send fluid for cell count. I had stopped her diuretics as she was noncompliant with my instructions for dosing and for lab follow up. If she is not taking the diuretics then we don't need another BMET right now. Thanks

## 2020-04-17 ENCOUNTER — Ambulatory Visit (HOSPITAL_COMMUNITY)
Admission: RE | Admit: 2020-04-17 | Discharge: 2020-04-17 | Disposition: A | Discharge status: Home / Self Care | Payer: Medicare HMO | Source: Ambulatory Visit | Attending: Gastroenterology | Admitting: Gastroenterology

## 2020-04-17 ENCOUNTER — Other Ambulatory Visit: Payer: Self-pay

## 2020-04-17 DIAGNOSIS — R188 Other ascites: Secondary | ICD-10-CM | POA: Diagnosis not present

## 2020-04-17 DIAGNOSIS — K7031 Alcoholic cirrhosis of liver with ascites: Secondary | ICD-10-CM

## 2020-04-17 HISTORY — PX: IR PARACENTESIS: IMG2679

## 2020-04-17 LAB — BODY FLUID CELL COUNT WITH DIFFERENTIAL
Eos, Fluid: 0 %
Lymphs, Fluid: 32 %
Monocyte-Macrophage-Serous Fluid: 56 % (ref 50–90)
Neutrophil Count, Fluid: 12 % (ref 0–25)
Total Nucleated Cell Count, Fluid: 57 cu mm (ref 0–1000)

## 2020-04-17 MED ORDER — LIDOCAINE HCL 1 % IJ SOLN
INTRAMUSCULAR | Status: AC
  Filled 2020-04-17: qty 20

## 2020-04-17 MED ORDER — ALBUMIN HUMAN 25 % IV SOLN
50.0000 g | Freq: Once | INTRAVENOUS | Status: AC
  Administered 2020-04-17: 50 g via INTRAVENOUS

## 2020-04-17 MED ORDER — LIDOCAINE HCL 1 % IJ SOLN
INTRAMUSCULAR | Status: DC | PRN
  Administered 2020-04-17: 20 mL via INTRADERMAL

## 2020-04-17 MED ORDER — ALBUMIN HUMAN 25 % IV SOLN
INTRAVENOUS | Status: AC
  Filled 2020-04-17: qty 200

## 2020-04-17 NOTE — Procedures (Signed)
PROCEDURE SUMMARY:  Successful US guided paracentesis from right abdomen.  Yielded 7.1 L of clear yellow fluid.  No immediate complications.  Pt tolerated well.   Specimen sent for labs.  EBL < 2 mL  Theresa Duty, NP 04/17/2020 3:21 PM

## 2020-04-18 LAB — PATHOLOGIST SMEAR REVIEW

## 2020-04-30 ENCOUNTER — Telehealth: Payer: Self-pay | Admitting: Gastroenterology

## 2020-04-30 ENCOUNTER — Other Ambulatory Visit: Payer: Self-pay

## 2020-04-30 DIAGNOSIS — K7031 Alcoholic cirrhosis of liver with ascites: Secondary | ICD-10-CM

## 2020-04-30 DIAGNOSIS — K7290 Hepatic failure, unspecified without coma: Secondary | ICD-10-CM

## 2020-04-30 DIAGNOSIS — K7682 Hepatic encephalopathy: Secondary | ICD-10-CM

## 2020-04-30 NOTE — Telephone Encounter (Signed)
Patient is requesting another appointment for Parancentisis

## 2020-04-30 NOTE — Telephone Encounter (Signed)
Patient has been scheduled for a paracentesis on 05/01/20 at 2 PM at [facility].   Spoke with patient, she is aware of appt, advised to arrive 15 minutes early. Patient verbalized understanding and had no concerns at the end of the call.

## 2020-04-30 NOTE — Telephone Encounter (Signed)
Dr. Havery Moros, please advise, thank you. Last paracentesis was 04/17/20.

## 2020-04-30 NOTE — Telephone Encounter (Signed)
Yes can refer for large volume paracentesis, albumin to be given if > 5 L removed. Thanks

## 2020-05-01 ENCOUNTER — Ambulatory Visit (HOSPITAL_COMMUNITY): Admission: RE | Admit: 2020-05-01 | Payer: Medicare HMO | Source: Ambulatory Visit

## 2020-05-02 ENCOUNTER — Telehealth: Payer: Self-pay | Admitting: Gastroenterology

## 2020-05-02 NOTE — Telephone Encounter (Signed)
Spoke with patient, she states that she was unable to make paracentesis appt yesterday, provided her with central scheduling number to reschedule at her convenience, order is still current.

## 2020-05-04 ENCOUNTER — Ambulatory Visit (HOSPITAL_COMMUNITY)
Admission: RE | Admit: 2020-05-04 | Discharge: 2020-05-04 | Disposition: A | Discharge status: Home / Self Care | Payer: Medicare HMO | Source: Ambulatory Visit | Attending: Gastroenterology | Admitting: Gastroenterology

## 2020-05-04 ENCOUNTER — Other Ambulatory Visit: Payer: Self-pay

## 2020-05-04 DIAGNOSIS — K7031 Alcoholic cirrhosis of liver with ascites: Secondary | ICD-10-CM | POA: Diagnosis present

## 2020-05-04 DIAGNOSIS — R69 Illness, unspecified: Secondary | ICD-10-CM | POA: Diagnosis not present

## 2020-05-04 HISTORY — PX: IR PARACENTESIS: IMG2679

## 2020-05-04 MED ORDER — ALBUMIN HUMAN 25 % IV SOLN
50.0000 g | Freq: Once | INTRAVENOUS | Status: AC
  Administered 2020-05-04: 50 g via INTRAVENOUS

## 2020-05-04 MED ORDER — LIDOCAINE HCL 1 % IJ SOLN
INTRAMUSCULAR | Status: AC
  Filled 2020-05-04: qty 20

## 2020-05-04 MED ORDER — LIDOCAINE HCL (PF) 1 % IJ SOLN
INTRAMUSCULAR | Status: DC | PRN
  Administered 2020-05-04: 10 mL

## 2020-05-04 MED ORDER — ALBUMIN HUMAN 25 % IV SOLN
INTRAVENOUS | Status: AC
  Filled 2020-05-04: qty 200

## 2020-05-04 NOTE — Procedures (Signed)
PROCEDURE SUMMARY:  Successful image-guided paracentesis from the right lateral abdomen.  Yielded 7.7 liters of clear gold fluid.  No immediate complications.  EBL = 0 mL. Patient tolerated well.   Specimen was not sent for labs.  Please see imaging section of Epic for full dictation.   Claris Pong Dugan Vanhoesen PA-C 05/04/2020 9:19 AM

## 2020-05-14 DIAGNOSIS — H2513 Age-related nuclear cataract, bilateral: Secondary | ICD-10-CM | POA: Diagnosis not present

## 2020-05-21 ENCOUNTER — Telehealth: Payer: Self-pay | Admitting: Gastroenterology

## 2020-05-21 ENCOUNTER — Other Ambulatory Visit: Payer: Self-pay

## 2020-05-21 DIAGNOSIS — K7031 Alcoholic cirrhosis of liver with ascites: Secondary | ICD-10-CM

## 2020-05-21 NOTE — Telephone Encounter (Signed)
Dr. Havery Moros, okay to schedule large volume paracentesis, albumin to be given if > 5 L removed?  Last paracentesis was on 05/04/20. Please advise, thank you.

## 2020-05-21 NOTE — Telephone Encounter (Signed)
Patient has been scheduled for paracentesis at [facility] on 05/25/20 at 1:30 PM. Spoke with patient and she is aware of appointment information. Pt had no other concerns at the end of the call.

## 2020-05-21 NOTE — Telephone Encounter (Signed)
Yes that's fine, they should also send fluid for cell count to rule out SBP. Thanks

## 2020-05-25 ENCOUNTER — Ambulatory Visit (HOSPITAL_COMMUNITY)
Admission: RE | Admit: 2020-05-25 | Discharge: 2020-05-25 | Disposition: A | Discharge status: Home / Self Care | Payer: Medicare HMO | Source: Ambulatory Visit | Attending: Gastroenterology | Admitting: Gastroenterology

## 2020-05-25 ENCOUNTER — Other Ambulatory Visit: Payer: Self-pay

## 2020-05-25 DIAGNOSIS — R69 Illness, unspecified: Secondary | ICD-10-CM | POA: Diagnosis not present

## 2020-05-25 DIAGNOSIS — R188 Other ascites: Secondary | ICD-10-CM | POA: Diagnosis not present

## 2020-05-25 DIAGNOSIS — K7031 Alcoholic cirrhosis of liver with ascites: Secondary | ICD-10-CM

## 2020-05-25 LAB — BODY FLUID CELL COUNT WITH DIFFERENTIAL
Eos, Fluid: 0 %
Lymphs, Fluid: 20 %
Monocyte-Macrophage-Serous Fluid: 74 % (ref 50–90)
Neutrophil Count, Fluid: 6 % (ref 0–25)
Total Nucleated Cell Count, Fluid: 67 cu mm (ref 0–1000)

## 2020-05-25 MED ORDER — ALBUMIN HUMAN 25 % IV SOLN
50.0000 g | Freq: Once | INTRAVENOUS | Status: AC
  Administered 2020-05-25: 50 g via INTRAVENOUS
  Filled 2020-05-25: qty 200

## 2020-05-25 MED ORDER — LIDOCAINE HCL 1 % IJ SOLN
INTRAMUSCULAR | Status: AC
  Filled 2020-05-25: qty 20

## 2020-05-25 NOTE — Procedures (Signed)
Ultrasound-guided diagnostic and therapeutic paracentesis performed yielding 8 liters (maximum ordered) of yellow fluid. No immediate complications.  A portion of the fluid was submitted to the lab for preordered studies.  EBL none.  Patient will receive IV albumin infusion post procedure.

## 2020-05-28 LAB — PATHOLOGIST SMEAR REVIEW

## 2020-06-04 ENCOUNTER — Telehealth: Payer: Self-pay | Admitting: Gastroenterology

## 2020-06-04 NOTE — Telephone Encounter (Signed)
Inbound call from patient stating had paracentesis done beginning of this month.  Informed patient that she is due for a follow-up with Dr. Havery Moros but is requesting to have another one done if possible first.  Please advise.

## 2020-06-05 ENCOUNTER — Other Ambulatory Visit: Payer: Self-pay

## 2020-06-05 DIAGNOSIS — K7031 Alcoholic cirrhosis of liver with ascites: Secondary | ICD-10-CM

## 2020-06-05 NOTE — Telephone Encounter (Signed)
Please see below, thank you.

## 2020-06-05 NOTE — Telephone Encounter (Signed)
Patient has been scheduled for paracentesis at [facility] on Thursday, 06/07/20 at 10 AM, will need to arrive at 9:45 AM.   Spoke with patient and she is aware of appointment. She verbalized understanding of all information and had no concerns at the end of the call.

## 2020-06-05 NOTE — Telephone Encounter (Signed)
Can do another large volume paracentesis, given albumin if > 5 L. Would send fluid for cell count to rule out SBP. She has not been compliant with diuretics so managing this issue with paracentesis. Thanks

## 2020-06-05 NOTE — Telephone Encounter (Signed)
Patient calling to follow up on previous request states she is filling up.

## 2020-06-07 ENCOUNTER — Other Ambulatory Visit: Payer: Self-pay

## 2020-06-07 ENCOUNTER — Ambulatory Visit (HOSPITAL_COMMUNITY)
Admission: RE | Admit: 2020-06-07 | Discharge: 2020-06-07 | Disposition: A | Discharge status: Home / Self Care | Payer: Medicare HMO | Source: Ambulatory Visit | Attending: Gastroenterology | Admitting: Gastroenterology

## 2020-06-07 DIAGNOSIS — K7031 Alcoholic cirrhosis of liver with ascites: Secondary | ICD-10-CM | POA: Diagnosis present

## 2020-06-07 DIAGNOSIS — R69 Illness, unspecified: Secondary | ICD-10-CM | POA: Diagnosis not present

## 2020-06-07 HISTORY — PX: IR PARACENTESIS: IMG2679

## 2020-06-07 LAB — BODY FLUID CELL COUNT WITH DIFFERENTIAL
Lymphs, Fluid: 12 %
Monocyte-Macrophage-Serous Fluid: 77 % (ref 50–90)
Neutrophil Count, Fluid: 11 % (ref 0–25)
Total Nucleated Cell Count, Fluid: 100 cu mm (ref 0–1000)

## 2020-06-07 MED ORDER — ALBUMIN HUMAN 25 % IV SOLN
INTRAVENOUS | Status: AC
  Filled 2020-06-07: qty 200

## 2020-06-07 MED ORDER — LIDOCAINE HCL (PF) 1 % IJ SOLN
INTRAMUSCULAR | Status: DC | PRN
  Administered 2020-06-07: 10 mL

## 2020-06-07 MED ORDER — LIDOCAINE HCL 1 % IJ SOLN
INTRAMUSCULAR | Status: AC
  Filled 2020-06-07: qty 20

## 2020-06-07 MED ORDER — ALBUMIN HUMAN 25 % IV SOLN
50.0000 g | Freq: Once | INTRAVENOUS | Status: AC
  Administered 2020-06-07: 10:00:00 50 g via INTRAVENOUS
  Filled 2020-06-07: qty 200

## 2020-06-07 NOTE — Procedures (Signed)
PROCEDURE SUMMARY:  Successful US guided paracentesis from right lateral abdomen.  Yielded 6.2 liters of clear yellow fluid.  No immediate complications.  Patient tolerated well.  EBL = trace  Specimen was sent for labs.  Ebonique Hallstrom S Knowledge Escandon PA-C 06/07/2020 11:23 AM

## 2020-06-08 LAB — PATHOLOGIST SMEAR REVIEW

## 2020-06-26 ENCOUNTER — Telehealth: Payer: Self-pay | Admitting: Gastroenterology

## 2020-06-26 DIAGNOSIS — K7031 Alcoholic cirrhosis of liver with ascites: Secondary | ICD-10-CM

## 2020-06-26 NOTE — Telephone Encounter (Signed)
Dr. Adela Lank, okay to schedule large volume paracentesis, albumin to be given if > 5 L removed? Labs? Last paracentesis was on 06/07/20. Please advise, thank you.

## 2020-06-26 NOTE — Telephone Encounter (Signed)
Patient has been scheduled for a paracentesis at [facility] on Thursday, 06/28/2020 at 10 AM, patient is to arrive at 9:45 AM.  Spoke with patient, she is aware of appointment.Patient verbalized understanding and had no concerns at the end of the call.

## 2020-06-26 NOTE — Telephone Encounter (Signed)
Yes, we can schedule her for that. Thanks. I would send cell count with diff as well. We have been using large volume paracentesis as needed to manage ascites given her noncompliance with diuretics and lab draws for that

## 2020-06-28 ENCOUNTER — Ambulatory Visit (HOSPITAL_COMMUNITY)
Admission: RE | Admit: 2020-06-28 | Discharge: 2020-06-28 | Disposition: A | Discharge status: Home / Self Care | Payer: Medicare HMO | Source: Ambulatory Visit | Attending: Gastroenterology | Admitting: Gastroenterology

## 2020-06-28 ENCOUNTER — Telehealth: Payer: Self-pay

## 2020-06-28 ENCOUNTER — Other Ambulatory Visit: Payer: Self-pay

## 2020-06-28 DIAGNOSIS — I8500 Esophageal varices without bleeding: Secondary | ICD-10-CM

## 2020-06-28 DIAGNOSIS — K7031 Alcoholic cirrhosis of liver with ascites: Secondary | ICD-10-CM | POA: Diagnosis present

## 2020-06-28 DIAGNOSIS — R69 Illness, unspecified: Secondary | ICD-10-CM | POA: Diagnosis not present

## 2020-06-28 DIAGNOSIS — K7682 Hepatic encephalopathy: Secondary | ICD-10-CM

## 2020-06-28 DIAGNOSIS — K7290 Hepatic failure, unspecified without coma: Secondary | ICD-10-CM

## 2020-06-28 HISTORY — PX: IR PARACENTESIS: IMG2679

## 2020-06-28 LAB — BODY FLUID CELL COUNT WITH DIFFERENTIAL
Eos, Fluid: 0 %
Lymphs, Fluid: 26 %
Monocyte-Macrophage-Serous Fluid: 49 % — ABNORMAL LOW (ref 50–90)
Neutrophil Count, Fluid: 25 % (ref 0–25)
Total Nucleated Cell Count, Fluid: 64 cu mm (ref 0–1000)

## 2020-06-28 MED ORDER — ALBUMIN HUMAN 25 % IV SOLN
50.0000 g | Freq: Once | INTRAVENOUS | Status: AC
  Administered 2020-06-28: 50 g via INTRAVENOUS

## 2020-06-28 MED ORDER — LIDOCAINE HCL 1 % IJ SOLN
INTRAMUSCULAR | Status: AC
  Filled 2020-06-28: qty 20

## 2020-06-28 MED ORDER — ALBUMIN HUMAN 25 % IV SOLN
INTRAVENOUS | Status: AC
  Filled 2020-06-28: qty 200

## 2020-06-28 NOTE — Procedures (Signed)
PROCEDURE SUMMARY:  Successful image-guided paracentesis from the right lateral abdomen.  Yielded 8.4 liters of clear gold fluid.  No immediate complications.  EBL = 0 mL. Patient tolerated well.   Specimen was sent for labs.  Please see imaging section of Epic for full dictation.   Gordy Councilman Honor Fairbank PA-C 06/28/2020 10:25 AM

## 2020-06-28 NOTE — Telephone Encounter (Signed)
Called patient's daughter, Joni Reining, at [phone] and LM regarding pt needing to have repeat EGD for screening of esophageal varices DX - cirrhosis) on Thursday, 08-30-20 in the AM. Pt would need to COVID test on Monday, 08-27-20. Asked that she call me back to discuss.

## 2020-06-29 ENCOUNTER — Other Ambulatory Visit: Payer: Self-pay

## 2020-06-29 DIAGNOSIS — K7031 Alcoholic cirrhosis of liver with ascites: Secondary | ICD-10-CM

## 2020-06-29 DIAGNOSIS — F1029 Alcohol dependence with unspecified alcohol-induced disorder: Secondary | ICD-10-CM

## 2020-06-29 DIAGNOSIS — I8500 Esophageal varices without bleeding: Secondary | ICD-10-CM

## 2020-06-29 LAB — PATHOLOGIST SMEAR REVIEW

## 2020-06-29 NOTE — Telephone Encounter (Signed)
Called patient. Offered her a EGD at [facility] with Dr. Havery Moros to screen for varices on  2 -15 Tuesday. Patient said she would do that day. I will mail her her instructions as she has had this procedure many times.She agreed to call me once we got her instructions if she had any questions. Orders entered. Pt scheduled for Tuesday, 2-15 at 7:30am Case: [id number]. Covid test scheduled for Friday, 2-10 at 1:45pm.

## 2020-07-03 ENCOUNTER — Telehealth: Payer: Self-pay | Admitting: Gastroenterology

## 2020-07-03 NOTE — Telephone Encounter (Signed)
Called and spoke to [place], patient's daughter.  She wanted information about dates and times of procedure at [facility] on 2-15.  All questions answered.

## 2020-07-05 ENCOUNTER — Telehealth: Payer: Self-pay | Admitting: Gastroenterology

## 2020-07-05 NOTE — Telephone Encounter (Signed)
Pt called stating that she cannot go to have her Covid test before her procedure at [facility] hospital due to lack of transportation. Pt is asking is there is another alternative to have Covid test.

## 2020-07-05 NOTE — Telephone Encounter (Signed)
Called and Left detailed message for [name], pts daughter who is typically her transportation, that she has to be tested on Friday the 11th. They are open from 8- 3:30pm and I could move the time if that helps them to keep her procedure on 2-15

## 2020-07-06 ENCOUNTER — Telehealth: Payer: Self-pay | Admitting: Gastroenterology

## 2020-07-06 NOTE — Telephone Encounter (Signed)
See communication from 07-05-20 phone note

## 2020-07-06 NOTE — Telephone Encounter (Signed)
Patient has transportation for the COVID testing and no need to change times

## 2020-07-11 ENCOUNTER — Telehealth: Payer: Self-pay

## 2020-07-11 ENCOUNTER — Other Ambulatory Visit: Payer: Self-pay

## 2020-07-11 DIAGNOSIS — K7031 Alcoholic cirrhosis of liver with ascites: Secondary | ICD-10-CM

## 2020-07-11 NOTE — Telephone Encounter (Signed)
-----   Message from Marlon Pel, RN sent at 07/09/2020 11:27 AM EST -----  ----- Message ----- From: Marlon Pel, RN Sent: 07/08/2020  12:00 AM EST To: Marlon Pel, RN  Needs RUQ [place] see results 01/06/20- Havery Moros

## 2020-07-11 NOTE — Telephone Encounter (Signed)
Patient has been scheduled for a RUQ [place] at [facility] on Tuesday, 07/17/2020 at 10:30 am, patient will need to arrive at 10:15 am. NPO after midnight.   Spoke with patient and provided her with her appointment information, I also provided patient with central scheduling number in case she needs to reschedule for any reason. Patient verbalized understanding of all information and had no concerns at the end of the call.

## 2020-07-16 ENCOUNTER — Telehealth: Payer: Self-pay | Admitting: Family Medicine

## 2020-07-16 NOTE — Telephone Encounter (Signed)
Left message for patient to call back and schedule Medicare Annual Wellness Visit (AWV) either virtually or in office.   Last AWV no information  please schedule at anytime with [facility] Nurse Health Advisor 1 or 2   This should be a 45 minute visit.  Patient also needs appointment with PCP last appointment 06/11/18

## 2020-07-17 ENCOUNTER — Telehealth: Payer: Self-pay

## 2020-07-17 ENCOUNTER — Ambulatory Visit (HOSPITAL_COMMUNITY)
Admission: RE | Admit: 2020-07-17 | Discharge: 2020-07-17 | Disposition: A | Discharge status: Home / Self Care | Payer: Medicare HMO | Source: Ambulatory Visit | Attending: Gastroenterology | Admitting: Gastroenterology

## 2020-07-17 ENCOUNTER — Other Ambulatory Visit: Payer: Self-pay

## 2020-07-17 DIAGNOSIS — K7031 Alcoholic cirrhosis of liver with ascites: Secondary | ICD-10-CM

## 2020-07-17 DIAGNOSIS — R188 Other ascites: Secondary | ICD-10-CM | POA: Diagnosis not present

## 2020-07-17 DIAGNOSIS — K7290 Hepatic failure, unspecified without coma: Secondary | ICD-10-CM

## 2020-07-17 DIAGNOSIS — K7682 Hepatic encephalopathy: Secondary | ICD-10-CM

## 2020-07-17 DIAGNOSIS — R69 Illness, unspecified: Secondary | ICD-10-CM | POA: Diagnosis not present

## 2020-07-17 NOTE — Telephone Encounter (Signed)
Received a call report from [place] at [facility] in regards to RUQ [place] from today.   IMPRESSION: 1.  Moderate ascites.  Right pleural effusion.  2. The appearance of the liver is consistent with hepatic cirrhosis. Liver is somewhat small with nodular contour and overall inhomogeneous echotexture. While no focal liver lesions are evident on this study, it must be cautioned that the sensitivity of ultrasound for detection of focal liver lesions is diminished in this circumstance.  3. Sludge and debris within the gallbladder. The gallbladder wall appears thickened with mild pericholecystic fluid. Patient is tender over the gallbladder. Ascites and cirrhosis can result in gallbladder wall thickening. The tenderness over the gallbladder as well as apparent pericholecystic fluid do raise concern for a degree of acute cholecystitis.  4. Mild debris noted in the biliary ductal system. No appreciable biliary duct dilatation. From an imaging standpoint, MRCP is the optimum imaging study of choice to further evaluate the biliary ductal system.

## 2020-07-17 NOTE — Telephone Encounter (Signed)
[place] can you please contact the patient to see how she is feeling? Her gallbladder has been thickened on prior exams so that particular findings may not be new for her. If she is feeling at baseline and nothing new I would continue to monitor. If she has new pain in the RUQ we should obtain LFTs and CBC to make sure stable. If fevers, intolerance of PO, severe pain etc, she needs to let us know, may need to seek care in ED in that setting. Thanks

## 2020-07-18 DIAGNOSIS — H2512 Age-related nuclear cataract, left eye: Secondary | ICD-10-CM | POA: Diagnosis not present

## 2020-07-18 DIAGNOSIS — R69 Illness, unspecified: Secondary | ICD-10-CM | POA: Diagnosis not present

## 2020-07-18 DIAGNOSIS — Z882 Allergy status to sulfonamides status: Secondary | ICD-10-CM | POA: Diagnosis not present

## 2020-07-18 DIAGNOSIS — K769 Liver disease, unspecified: Secondary | ICD-10-CM | POA: Diagnosis not present

## 2020-07-18 DIAGNOSIS — H2513 Age-related nuclear cataract, bilateral: Secondary | ICD-10-CM | POA: Diagnosis not present

## 2020-07-18 DIAGNOSIS — H25812 Combined forms of age-related cataract, left eye: Secondary | ICD-10-CM | POA: Diagnosis not present

## 2020-07-18 DIAGNOSIS — H2589 Other age-related cataract: Secondary | ICD-10-CM | POA: Diagnosis not present

## 2020-07-18 DIAGNOSIS — H269 Unspecified cataract: Secondary | ICD-10-CM | POA: Diagnosis not present

## 2020-07-18 NOTE — Addendum Note (Signed)
Addended by: Yevette Edwards on: 07/18/2020 12:53 PM   Modules accepted: Orders

## 2020-07-18 NOTE — Telephone Encounter (Signed)
Thanks [place], she is actually due for routine labs anyway - let's have her get CMET, CBC, INR, AFP at her convenience. Thanks

## 2020-07-18 NOTE — Telephone Encounter (Signed)
Spoke with patient, she states that she has been feeling okay right now, pt states that she does have RUQ pain when fluid builds up. She states that this is nothing new but the fluid is accumulating more quickly. Patient denies any fevers, she states that she is able to tolerate foods and liquids fine. She is aware that if she develops a fever, intolerance to anything by mouth, or severe pain she will need to let us know because she may need to seek care in the ED in that setting. Patient verbalized understanding of all information and had no concerns at the end of the call.

## 2020-07-18 NOTE — Telephone Encounter (Signed)
Lm on vm for patient to return call 

## 2020-07-18 NOTE — Telephone Encounter (Addendum)
Spoke with patient, she is aware that she is due for lab work at this time. She is aware that she can stop by the lab at her convenience between 7:30 AM - 5 PM. Provided patient with the office address. Patient verbalized understanding of all information and had no concerns at the end of the call.   Lab orders in epic.

## 2020-07-19 ENCOUNTER — Other Ambulatory Visit (INDEPENDENT_AMBULATORY_CARE_PROVIDER_SITE_OTHER): Payer: Medicare HMO

## 2020-07-19 DIAGNOSIS — K7290 Hepatic failure, unspecified without coma: Secondary | ICD-10-CM | POA: Diagnosis not present

## 2020-07-19 DIAGNOSIS — K7031 Alcoholic cirrhosis of liver with ascites: Secondary | ICD-10-CM | POA: Diagnosis not present

## 2020-07-19 DIAGNOSIS — R69 Illness, unspecified: Secondary | ICD-10-CM | POA: Diagnosis not present

## 2020-07-19 DIAGNOSIS — K7682 Hepatic encephalopathy: Secondary | ICD-10-CM

## 2020-07-19 LAB — CBC WITH DIFFERENTIAL/PLATELET
Basophils Absolute: 0 10*3/uL (ref 0.0–0.1)
Basophils Relative: 0.4 % (ref 0.0–3.0)
Eosinophils Absolute: 0.1 10*3/uL (ref 0.0–0.7)
Eosinophils Relative: 4.7 % (ref 0.0–5.0)
HCT: 30.8 % — ABNORMAL LOW (ref 36.0–46.0)
Hemoglobin: 9.9 g/dL — ABNORMAL LOW (ref 12.0–15.0)
Lymphocytes Relative: 26.4 % (ref 12.0–46.0)
Lymphs Abs: 0.7 10*3/uL (ref 0.7–4.0)
MCHC: 32.1 g/dL (ref 30.0–36.0)
MCV: 76.9 fl — ABNORMAL LOW (ref 78.0–100.0)
Monocytes Absolute: 0.4 10*3/uL (ref 0.1–1.0)
Monocytes Relative: 15.2 % — ABNORMAL HIGH (ref 3.0–12.0)
Neutro Abs: 1.4 10*3/uL (ref 1.4–7.7)
Neutrophils Relative %: 53.3 % (ref 43.0–77.0)
Platelets: 107 10*3/uL — ABNORMAL LOW (ref 150.0–400.0)
RBC: 4 Mil/uL (ref 3.87–5.11)
RDW: 26.4 % — ABNORMAL HIGH (ref 11.5–15.5)
WBC: 2.6 10*3/uL — ABNORMAL LOW (ref 4.0–10.5)

## 2020-07-19 LAB — COMPREHENSIVE METABOLIC PANEL
ALT: 14 U/L (ref 0–35)
AST: 39 U/L — ABNORMAL HIGH (ref 0–37)
Albumin: 3 g/dL — ABNORMAL LOW (ref 3.5–5.2)
Alkaline Phosphatase: 160 U/L — ABNORMAL HIGH (ref 39–117)
BUN: 11 mg/dL (ref 6–23)
CO2: 28 mEq/L (ref 19–32)
Calcium: 8.6 mg/dL (ref 8.4–10.5)
Chloride: 103 mEq/L (ref 96–112)
Creatinine, Ser: 0.66 mg/dL (ref 0.40–1.20)
GFR: 98.66 mL/min (ref >60.00)
Glucose, Bld: 113 mg/dL — ABNORMAL HIGH (ref 70–99)
Potassium: 3.1 mEq/L — ABNORMAL LOW (ref 3.5–5.1)
Sodium: 137 mEq/L (ref 135–145)
Total Bilirubin: 1.3 mg/dL — ABNORMAL HIGH (ref 0.2–1.2)
Total Protein: 6.8 g/dL (ref 6.0–8.3)

## 2020-07-19 LAB — PROTIME-INR
INR: 1.5 ratio — ABNORMAL HIGH (ref 0.8–1.0)
Prothrombin Time: 16.3 s — ABNORMAL HIGH (ref 9.6–13.1)

## 2020-07-20 ENCOUNTER — Other Ambulatory Visit: Payer: Self-pay

## 2020-07-20 ENCOUNTER — Telehealth: Payer: Self-pay | Admitting: Gastroenterology

## 2020-07-20 DIAGNOSIS — D509 Iron deficiency anemia, unspecified: Secondary | ICD-10-CM

## 2020-07-20 LAB — AFP TUMOR MARKER: AFP-Tumor Marker: 1.5 ng/mL

## 2020-07-20 MED ORDER — FERROUS SULFATE 325 (65 FE) MG PO TABS: 325.0000 mg | ORAL_TABLET | Freq: Every day | ORAL | 3 refills | Status: DC

## 2020-07-20 NOTE — Telephone Encounter (Signed)
Spoke with patient, see 07/19/20 result note for more information

## 2020-07-20 NOTE — Telephone Encounter (Signed)
Pt is requesting a call back again

## 2020-07-20 NOTE — Telephone Encounter (Signed)
Patient returned your call about results, please call patient one more time.   

## 2020-07-23 ENCOUNTER — Telehealth: Payer: Self-pay | Admitting: Gastroenterology

## 2020-07-23 DIAGNOSIS — K7031 Alcoholic cirrhosis of liver with ascites: Secondary | ICD-10-CM

## 2020-07-23 NOTE — Telephone Encounter (Signed)
Okay for paracentesis, give albumin if > 5 L removed. Send fluid for cell count, thanks

## 2020-07-23 NOTE — Telephone Encounter (Signed)
Patient calling to schehdule paracenthesis

## 2020-07-23 NOTE — Telephone Encounter (Signed)
Okay to schedule large volume paracentesis? Last paracentesis was 06/28/2020, 8.4 L removed. Please advise, thank you.

## 2020-07-24 NOTE — Telephone Encounter (Signed)
Patient is scheduled for a paracentesis at [facility] on Friday, 07/27/20 at 2 PM, arrive at 1:45 PM.   Spoke with patient, she is aware of paracentesis appointment. Patient verbalized understanding of all information and had no concerns at the end of the call.

## 2020-07-27 ENCOUNTER — Other Ambulatory Visit: Payer: Self-pay

## 2020-07-27 ENCOUNTER — Encounter: Payer: Self-pay | Admitting: Student

## 2020-07-27 ENCOUNTER — Ambulatory Visit (HOSPITAL_COMMUNITY)
Admission: RE | Admit: 2020-07-27 | Discharge: 2020-07-27 | Disposition: A | Discharge status: Home / Self Care | Payer: Medicare HMO | Source: Ambulatory Visit | Attending: Gastroenterology | Admitting: Gastroenterology

## 2020-07-27 DIAGNOSIS — R188 Other ascites: Secondary | ICD-10-CM | POA: Diagnosis not present

## 2020-07-27 DIAGNOSIS — K7031 Alcoholic cirrhosis of liver with ascites: Secondary | ICD-10-CM | POA: Insufficient documentation

## 2020-07-27 DIAGNOSIS — R69 Illness, unspecified: Secondary | ICD-10-CM | POA: Diagnosis not present

## 2020-07-27 HISTORY — PX: IR PARACENTESIS: IMG2679

## 2020-07-27 LAB — BODY FLUID CELL COUNT WITH DIFFERENTIAL
Eos, Fluid: 0 %
Lymphs, Fluid: 18 %
Monocyte-Macrophage-Serous Fluid: 74 % (ref 50–90)
Neutrophil Count, Fluid: 8 % (ref 0–25)
Total Nucleated Cell Count, Fluid: 95 cu mm (ref 0–1000)

## 2020-07-27 MED ORDER — LIDOCAINE HCL (PF) 1 % IJ SOLN
INTRAMUSCULAR | Status: DC | PRN
  Administered 2020-07-27: 30 mL

## 2020-07-27 MED ORDER — LIDOCAINE HCL 1 % IJ SOLN
INTRAMUSCULAR | Status: AC
  Filled 2020-07-27: qty 20

## 2020-07-27 MED ORDER — ALBUMIN HUMAN 25 % IV SOLN
50.0000 g | Freq: Once | INTRAVENOUS | Status: AC
  Administered 2020-07-27: 50 g via INTRAVENOUS

## 2020-07-27 MED ORDER — ALBUMIN HUMAN 25 % IV SOLN
INTRAVENOUS | Status: AC
  Filled 2020-07-27: qty 200

## 2020-07-27 NOTE — Procedures (Deleted)
PROCEDURE SUMMARY:  Successful image-guided paracentesis from the right lateral abdomen.  Yielded 8.4 liters of clear yellow fluid.  No immediate complications.  EBL = trace  Patient tolerated well.   Specimen sent for labs.  Please see imaging section of Epic for full dictation.   Arella Blinder H Makayah Pauli PA-C 07/27/2020 2:00 PM

## 2020-07-27 NOTE — Procedures (Addendum)
PROCEDURE SUMMARY:  Successful image-guided paracentesis from the right lateral abdomen.  Yielded 8.4 liters of clear yellow fluid.  No immediate complications.  EBL = trace  Patient tolerated well.   Specimen sent for labs.   Full dictation: INDICATION: History of cirrhosis with recurrent ascites. Request for diagnostic and therapeutic paracentesis.  EXAM: IR ULTRASOUND GUIDED right lower quadrant PARACENTESIS  MEDICATIONS: 10 mL 1% lidocaine  COMPLICATIONS: None immediate.  PROCEDURE: Informed written consent was obtained from the patient after a discussion of the risks, benefits and alternatives to treatment. A timeout was performed prior to the initiation of the procedure.  Initial ultrasound scanning demonstrates a large amount of ascites within the left lower abdominal quadrant. The left lower abdomen was prepped and draped in the usual sterile fashion. 1% lidocaine was used for local anesthesia.  Following this, a 19 gauge, 7-cm, Yueh catheter was introduced. An ultrasound image was saved for documentation purposes. The paracentesis was performed.  Unfortunately, the catheter was accidentally removed mid-procedure. The catheter site was re-examined with ultrasound to confirm peritoneal fluid, site was we prepped and re-draped, and a new 10 gauge, 7-cm, Yueh catheter was introduced into the same site without additional use of lidocaine. Patient tolerated well.  The catheter was removed and a dressing was applied. The patient tolerated the procedure well without immediate post procedural complication. Patient received post-procedure intravenous albumin; see nursing notes for details.  FINDINGS: A total of approximately 8.4 L of clear yellow fluid was removed. Samples were sent to the laboratory as requested by the clinical team.  IMPRESSION: Successful ultrasound-guided paracentesis yielding 8.4 liters of peritoneal fluid.  Read by: Durenda Guthrie,  PA-C   [place] PA-C 07/27/2020 3:09 PM

## 2020-07-30 LAB — PATHOLOGIST SMEAR REVIEW

## 2020-07-30 NOTE — Telephone Encounter (Signed)
Spoke with patient, advised that she will need to let IR know what is going on, provided patient with IR phone number. Patient verbalized understanding and had no concerns at the end of the call.

## 2020-07-30 NOTE — Telephone Encounter (Signed)
Patient called states she has been leaking since her paracentesis appt.on Friday.

## 2020-08-01 ENCOUNTER — Encounter (HOSPITAL_COMMUNITY): Payer: Self-pay | Admitting: Gastroenterology

## 2020-08-01 ENCOUNTER — Other Ambulatory Visit: Payer: Self-pay

## 2020-08-03 ENCOUNTER — Other Ambulatory Visit (HOSPITAL_COMMUNITY)
Admission: RE | Admit: 2020-08-03 | Discharge: 2020-08-03 | Disposition: A | Discharge status: Home / Self Care | Payer: Medicare HMO | Source: Ambulatory Visit | Attending: Gastroenterology | Admitting: Gastroenterology

## 2020-08-03 DIAGNOSIS — Z01812 Encounter for preprocedural laboratory examination: Secondary | ICD-10-CM | POA: Diagnosis not present

## 2020-08-03 DIAGNOSIS — Z20822 Contact with and (suspected) exposure to covid-19: Secondary | ICD-10-CM | POA: Insufficient documentation

## 2020-08-03 LAB — SARS CORONAVIRUS 2 (TAT 6-24 HRS): SARS Coronavirus 2: NEGATIVE

## 2020-08-06 NOTE — Anesthesia Preprocedure Evaluation (Addendum)
Anesthesia Evaluation  Patient identified by MRN, date of birth, ID band Patient awake    Reviewed: Allergy & Precautions, NPO status , Patient's Chart, lab work & pertinent test results  Airway Mallampati: II  TM Distance: >3 FB Neck ROM: Full    Dental   Gold left upper central:   Pulmonary Current Smoker and Patient abstained from smoking.,    Pulmonary exam normal breath sounds clear to auscultation       Cardiovascular negative cardio ROS Normal cardiovascular exam Rhythm:Regular Rate:Normal     Neuro/Psych  Headaches, PSYCHIATRIC DISORDERS    GI/Hepatic (+) Cirrhosis   Esophageal Varices and ascites  substance abuse  ,   Endo/Other  negative endocrine ROS  Renal/GU negative Renal ROS     Musculoskeletal Chronic back pain   Abdominal   Peds  Hematology  (+) Blood dyscrasia, anemia , Thrombocytopenia   Anesthesia Other Findings Cirrhosis, esophageal varices  Reproductive/Obstetrics                          Anesthesia Physical Anesthesia Plan  ASA: IV  Anesthesia Plan: MAC   Post-op Pain Management:    Induction: Intravenous  PONV Risk Score and Plan: 1 and Propofol infusion and Treatment may vary due to age or medical condition  Airway Management Planned: Nasal Cannula  Additional Equipment:   Intra-op Plan:   Post-operative Plan:   Informed Consent: I have reviewed the patients History and Physical, chart, labs and discussed the procedure including the risks, benefits and alternatives for the proposed anesthesia with the patient or authorized representative who has indicated his/her understanding and acceptance.     Dental advisory given  Plan Discussed with: CRNA  Anesthesia Plan Comments:        Anesthesia Quick Evaluation

## 2020-08-07 ENCOUNTER — Encounter (HOSPITAL_COMMUNITY): Payer: Self-pay | Admitting: Gastroenterology

## 2020-08-07 ENCOUNTER — Other Ambulatory Visit: Payer: Self-pay

## 2020-08-07 ENCOUNTER — Ambulatory Visit (HOSPITAL_COMMUNITY): Payer: Medicare HMO | Admitting: Anesthesiology

## 2020-08-07 ENCOUNTER — Ambulatory Visit (HOSPITAL_COMMUNITY)
Admission: RE | Admit: 2020-08-07 | Discharge: 2020-08-07 | Disposition: A | Discharge status: Home / Self Care | Payer: Medicare HMO | Attending: Gastroenterology | Admitting: Gastroenterology

## 2020-08-07 ENCOUNTER — Encounter (HOSPITAL_COMMUNITY)
Admission: RE | Disposition: A | Discharge status: Home / Self Care | Payer: Self-pay | Source: Home / Self Care | Attending: Gastroenterology

## 2020-08-07 DIAGNOSIS — I8510 Secondary esophageal varices without bleeding: Secondary | ICD-10-CM | POA: Diagnosis not present

## 2020-08-07 DIAGNOSIS — F17210 Nicotine dependence, cigarettes, uncomplicated: Secondary | ICD-10-CM | POA: Insufficient documentation

## 2020-08-07 DIAGNOSIS — K317 Polyp of stomach and duodenum: Secondary | ICD-10-CM | POA: Diagnosis not present

## 2020-08-07 DIAGNOSIS — I8500 Esophageal varices without bleeding: Secondary | ICD-10-CM | POA: Diagnosis not present

## 2020-08-07 DIAGNOSIS — K7031 Alcoholic cirrhosis of liver with ascites: Secondary | ICD-10-CM

## 2020-08-07 DIAGNOSIS — K2940 Chronic atrophic gastritis without bleeding: Secondary | ICD-10-CM | POA: Diagnosis not present

## 2020-08-07 DIAGNOSIS — Z882 Allergy status to sulfonamides status: Secondary | ICD-10-CM | POA: Diagnosis not present

## 2020-08-07 DIAGNOSIS — K2970 Gastritis, unspecified, without bleeding: Secondary | ICD-10-CM | POA: Diagnosis not present

## 2020-08-07 DIAGNOSIS — Z91040 Latex allergy status: Secondary | ICD-10-CM | POA: Insufficient documentation

## 2020-08-07 DIAGNOSIS — K254 Chronic or unspecified gastric ulcer with hemorrhage: Secondary | ICD-10-CM | POA: Diagnosis not present

## 2020-08-07 DIAGNOSIS — R69 Illness, unspecified: Secondary | ICD-10-CM | POA: Diagnosis not present

## 2020-08-07 DIAGNOSIS — F1029 Alcohol dependence with unspecified alcohol-induced disorder: Secondary | ICD-10-CM

## 2020-08-07 DIAGNOSIS — Z91041 Radiographic dye allergy status: Secondary | ICD-10-CM | POA: Insufficient documentation

## 2020-08-07 DIAGNOSIS — K3189 Other diseases of stomach and duodenum: Secondary | ICD-10-CM | POA: Diagnosis not present

## 2020-08-07 DIAGNOSIS — I8501 Esophageal varices with bleeding: Secondary | ICD-10-CM | POA: Diagnosis not present

## 2020-08-07 DIAGNOSIS — K219 Gastro-esophageal reflux disease without esophagitis: Secondary | ICD-10-CM | POA: Insufficient documentation

## 2020-08-07 DIAGNOSIS — K2990 Gastroduodenitis, unspecified, without bleeding: Secondary | ICD-10-CM

## 2020-08-07 DIAGNOSIS — D132 Benign neoplasm of duodenum: Secondary | ICD-10-CM

## 2020-08-07 DIAGNOSIS — K2971 Gastritis, unspecified, with bleeding: Secondary | ICD-10-CM | POA: Diagnosis not present

## 2020-08-07 HISTORY — PX: BIOPSY: SHX5522

## 2020-08-07 HISTORY — PX: ESOPHAGOGASTRODUODENOSCOPY (EGD) WITH PROPOFOL: SHX5813

## 2020-08-07 LAB — POCT I-STAT, CHEM 8
BUN: 13 mg/dL (ref 6–20)
Calcium, Ion: 1.27 mmol/L (ref 1.15–1.40)
Chloride: 107 mmol/L (ref 98–111)
Creatinine, Ser: 0.6 mg/dL (ref 0.44–1.00)
Glucose, Bld: 80 mg/dL (ref 70–99)
HCT: 31 % — ABNORMAL LOW (ref 36.0–46.0)
Hemoglobin: 10.5 g/dL — ABNORMAL LOW (ref 12.0–15.0)
Potassium: 3.7 mmol/L (ref 3.5–5.1)
Sodium: 139 mmol/L (ref 135–145)
TCO2: 22 mmol/L (ref 22–32)

## 2020-08-07 SURGERY — ESOPHAGOGASTRODUODENOSCOPY (EGD) WITH PROPOFOL
Anesthesia: Monitor Anesthesia Care

## 2020-08-07 MED ORDER — PROPOFOL 10 MG/ML IV BOLUS
INTRAVENOUS | Status: AC
  Filled 2020-08-07: qty 40

## 2020-08-07 MED ORDER — SODIUM CHLORIDE 0.9 % IV SOLN: INTRAVENOUS | Status: DC

## 2020-08-07 MED ORDER — PROPOFOL 500 MG/50ML IV EMUL
INTRAVENOUS | Status: DC | PRN
  Administered 2020-08-07: 50 mg via INTRAVENOUS
  Administered 2020-08-07: 30 mg via INTRAVENOUS
  Administered 2020-08-07: 50 mg via INTRAVENOUS
  Administered 2020-08-07: 20 mg via INTRAVENOUS
  Administered 2020-08-07: 50 mg via INTRAVENOUS
  Administered 2020-08-07: 30 mg via INTRAVENOUS
  Administered 2020-08-07: 60 mg via INTRAVENOUS
  Administered 2020-08-07: 50 mg via INTRAVENOUS

## 2020-08-07 MED ORDER — PROPOFOL 10 MG/ML IV BOLUS
INTRAVENOUS | Status: AC
  Filled 2020-08-07: qty 20

## 2020-08-07 MED ORDER — OMEPRAZOLE 20 MG PO CPDR: 20.0000 mg | DELAYED_RELEASE_CAPSULE | Freq: Every day | ORAL | 1 refills | Status: DC

## 2020-08-07 MED ORDER — GLYCOPYRROLATE PF 0.2 MG/ML IJ SOSY
PREFILLED_SYRINGE | INTRAMUSCULAR | Status: DC | PRN
  Administered 2020-08-07: .2 mg via INTRAVENOUS

## 2020-08-07 MED ORDER — LIDOCAINE HCL (PF) 2 % IJ SOLN
INTRAMUSCULAR | Status: DC | PRN
  Administered 2020-08-07 (×2): 40 mg via INTRADERMAL

## 2020-08-07 SURGICAL SUPPLY — 14 items

## 2020-08-07 NOTE — H&P (Signed)
HPI :  55 y/o female with a history of decompensated alcoholic cirrhosis, course complicated by refractory ascites and large esophageal varices that have bled in the past. She has had banding to treat the varices (not candidate for beta blocker due to refractory ascites), required > 20 bands over recent years to eradicate varices. Last EGD one year ago showed no varices amenable to banding, her last few exams we have not had to do banding. Otherwise feels well today, denies any cardiopulmonary symptoms. Has some baseline ascites managed with paracentesis, she has not been compliant with diuretics.  Past Medical History:  Diagnosis Date  . Alcoholic liver disease ([place])   . Chronic back pain    on disability  . Cirrhosis of liver ([place])   . Esophageal varices ([place])   . GERD (gastroesophageal reflux disease)   . Headache   . Jaundice      Past Surgical History:  Procedure Laterality Date  . BACK SURGERY     L3-L4  . COLONOSCOPY WITH PROPOFOL N/A 08/01/2019   Procedure: COLONOSCOPY WITH PROPOFOL;  Surgeon: Yetta Flock, MD;  Location: [facility] ENDOSCOPY;  Service: Gastroenterology;  Laterality: N/A;  . ESOPHAGEAL BANDING  04/25/2018   Procedure: ESOPHAGEAL BANDING;  Surgeon: Yetta Flock, MD;  Location: [facility]COPY;  Service: Gastroenterology;;  . ESOPHAGEAL BANDING  04/27/2018   Procedure: ESOPHAGEAL BANDING;  Surgeon: Milus Banister, MD;  Location: [place];  Service: Endoscopy;;  . ESOPHAGEAL BANDING  06/02/2018   Procedure: ESOPHAGEAL BANDING;  Surgeon: Doran Stabler, MD;  Location: [place];  Service: Gastroenterology;;  . ESOPHAGEAL BANDING N/A 07/07/2018   Procedure: ESOPHAGEAL BANDING;  Surgeon: Yetta Flock, MD;  Location: [facility];  Service: Gastroenterology;  Laterality: N/A;  . ESOPHAGEAL BANDING  07/27/2018   Procedure: ESOPHAGEAL BANDING;  Surgeon: Yetta Flock, MD;  Location: [facility];  Service: Gastroenterology;;  . ESOPHAGEAL  BANDING  08/30/2018   Procedure: ESOPHAGEAL BANDING;  Surgeon: Yetta Flock, MD;  Location: [facility]OS[facility];  Service: Gastroenterology;;  . ESOPHAGOGASTRODUODENOSCOPY N/A 04/27/2018   Procedure: ESOPHAGOGASTRODUODENOSCOPY (EGD);  Surgeon: Milus Banister, MD;  Location: [facility] ENDOSCOPY;  Service: Endoscopy;  Laterality: N/A;  . ESOPHAGOGASTRODUODENOSCOPY (EGD) WITH PROPOFOL N/A 04/25/2018   Procedure: ESOPHAGOGASTRODUODENOSCOPY (EGD) WITH PROPOFOL;  Surgeon: Yetta Flock, MD;  Location: [place];  Service: Gastroenterology;  Laterality: N/A;  . ESOPHAGOGASTRODUODENOSCOPY (EGD) WITH PROPOFOL N/A 06/02/2018   Procedure: ESOPHAGOGASTRODUODENOSCOPY (EGD) WITH PROPOFOL;  Surgeon: Doran Stabler, MD;  Location: [place];  Service: Gastroenterology;  Laterality: N/A;  . ESOPHAGOGASTRODUODENOSCOPY (EGD) WITH PROPOFOL N/A 07/07/2018   Procedure: ESOPHAGOGASTRODUODENOSCOPY (EGD) WITH PROPOFOL;  Surgeon: Yetta Flock, MD;  Location: [facility]OS[facility];  Service: Gastroenterology;  Laterality: N/A;  . ESOPHAGOGASTRODUODENOSCOPY (EGD) WITH PROPOFOL N/A 07/27/2018   Procedure: ESOPHAGOGASTRODUODENOSCOPY (EGD) WITH PROPOFOL;  Surgeon: Yetta Flock, MD;  Location: [facility]OS[facility];  Service: Gastroenterology;  Laterality: N/A;  . ESOPHAGOGASTRODUODENOSCOPY (EGD) WITH PROPOFOL N/A 08/30/2018   Procedure: ESOPHAGOGASTRODUODENOSCOPY (EGD) WITH PROPOFOL;  Surgeon: Yetta Flock, MD;  Location: [facility];  Service: Gastroenterology;  Laterality: N/A;  . ESOPHAGOGASTRODUODENOSCOPY (EGD) WITH PROPOFOL N/A 11/08/2018   Procedure: ESOPHAGOGASTRODUODENOSCOPY (EGD) WITH PROPOFOL;  Surgeon: Yetta Flock, MD;  Location: [facility]OS[facility];  Service: Gastroenterology;  Laterality: N/A;  . ESOPHAGOGASTRODUODENOSCOPY (EGD) WITH PROPOFOL N/A 01/31/2019   Procedure: ESOPHAGOGASTRODUODENOSCOPY (EGD) WITH PROPOFOL;  Surgeon: Yetta Flock, MD;  Location: [facility];  Service: Gastroenterology;   Laterality: N/A;  . ESOPHAGOGASTRODUODENOSCOPY (EGD) WITH  PROPOFOL N/A 08/01/2019   Procedure: ESOPHAGOGASTRODUODENOSCOPY (EGD) WITH PROPOFOL;  Surgeon: Yetta Flock, MD;  Location: [facility] ENDOS[facility];  Service: Gastroenterology;  Laterality: N/A;  . IR PARACENTESIS  04/26/2018  . IR PARACENTESIS  05/18/2018  . IR PARACENTESIS  05/31/2018  . IR PARACENTESIS  06/30/2018  . IR PARACENTESIS  08/05/2018  . IR PARACENTESIS  08/31/2018  . IR PARACENTESIS  10/08/2018  . IR PARACENTESIS  10/28/2018  . IR PARACENTESIS  02/14/2019  . IR PARACENTESIS  03/09/2019  . IR PARACENTESIS  04/05/2019  . IR PARACENTESIS  05/03/2019  . IR PARACENTESIS  06/20/2019  . IR PARACENTESIS  07/19/2019  . IR PARACENTESIS  08/19/2019  . IR PARACENTESIS  09/27/2019  . IR PARACENTESIS  10/07/2019  . IR PARACENTESIS  10/28/2019  . IR PARACENTESIS  11/16/2019  . IR PARACENTESIS  12/30/2019  . IR PARACENTESIS  01/16/2020  . IR PARACENTESIS  02/07/2020  . IR PARACENTESIS  03/06/2020  . IR PARACENTESIS  04/04/2020  . IR PARACENTESIS  04/17/2020  . IR PARACENTESIS  05/04/2020  . IR PARACENTESIS  06/07/2020  . IR PARACENTESIS  06/28/2020  . IR PARACENTESIS  07/27/2020  . IR RADIOLOGIST EVAL & MGMT  12/23/2018  . IR THORACENTESIS ASP PLEURAL SPACE W/IMG GUIDE  12/10/2018  . IR THORACENTESIS ASP PLEURAL SPACE W/IMG GUIDE  01/21/2019   Family History  Problem Relation Age of Onset  . Colon cancer Neg Hx   . Esophageal cancer Neg Hx   . Pancreatic cancer Neg Hx   . Stomach cancer Neg Hx    Social History   Tobacco Use  . Smoking status: Current Some Day Smoker    Packs/day: 1.00    Years: 20.00    Pack years: 20.00    Types: Cigarettes  . Smokeless tobacco: Never Used  . Tobacco comment: 5 a day  Vaping Use  . Vaping Use: Never used  Substance Use Topics  . Alcohol use: Not Currently    Comment: drinks a beer every few days   . Drug use: Not Currently   Current Facility-Administered Medications  Medication Dose Route Frequency  Provider Last Rate Last Admin  . 0.9 %  sodium chloride infusion   Intravenous Continuous Armbruster, Carlota Raspberry, MD       Facility-Administered Medications Ordered in Other Encounters  Medication Dose Route Frequency Provider Last Rate Last Admin  . lactated ringers infusion    Continuous PRN Leonor Liv, CRNA   New Bag at 03/09/19 0800  . lactated ringers infusion    Continuous PRN Leonor Liv, CRNA   New Bag at 03/09/19 7408   Allergies  Allergen Reactions  . Contrast Media [Iodinated Diagnostic Agents] Itching and Swelling    Needs 13-hr prep  . Latex Itching, Swelling and Other (See Comments)    No breathing impairment, however  . Sulfa Antibiotics Itching, Swelling and Other (See Comments)    No breathing impairment, however     Review of Systems: All systems reviewed and negative except where noted in HPI.    US Abdomen Limited RUQ (LIVER/GB)  Result Date: 07/17/2020 CLINICAL DATA:  Hepatic cirrhosis. EXAM: ULTRASOUND ABDOMEN LIMITED RIGHT UPPER QUADRANT COMPARISON:  January 05, 2020 FINDINGS: Gallbladder: Sludge and debris noted in the gallbladder. No gallstones evident. Gallbladder wall is diffusely thickened. Patient is focally tender over the gallbladder. There is pericholecystic fluid. Common bile duct: Diameter: 6 mm. No intrahepatic or extrahepatic biliary duct dilatation. Debris noted within the biliary ductal system.  Liver: No focal lesion identified. Liver has a nodular contour with an inhomogeneous echotexture pattern. Portal vein is patent on color Doppler imaging with normal direction of blood flow towards the liver. Other: There is moderate ascites as well as a right pleural effusion. IMPRESSION: 1.  Moderate ascites.  Right pleural effusion. 2. The appearance of the liver is consistent with hepatic cirrhosis. Liver is somewhat small with nodular contour and overall inhomogeneous echotexture. While no focal liver lesions are evident on this study, it must be cautioned  that the sensitivity of ultrasound for detection of focal liver lesions is diminished in this circumstance. 3. Sludge and debris within the gallbladder. The gallbladder wall appears thickened with mild pericholecystic fluid. Patient is tender over the gallbladder. Ascites and cirrhosis can result in gallbladder wall thickening. The tenderness over the gallbladder as well as apparent pericholecystic fluid do raise concern for a degree of acute cholecystitis. 4. Mild debris noted in the biliary ductal system. No appreciable biliary duct dilatation. From an imaging standpoint, MRCP is the optimum imaging study of choice to further evaluate the biliary ductal system. These results will be called to the ordering clinician or representative by the Radiologist Assistant, and communication documented in the PACS or [employer]. Electronically Signed   By: Lowella Grip III M.D.   On: 07/17/2020 15:54    Physical Exam: BP (!) 126/92   Pulse 78   Temp 98.4 F (36.9 C) (Oral)   Resp 15   Ht 5\' 4"  (1.626 m)   Wt 56.7 kg   SpO2 99%   BMI 21.46 kg/m  Constitutional: Pleasant, female in no acute distress. Cardiovascular: Normal rate, regular rhythm.  Pulmonary/chest: Effort normal and breath sounds normal.  Abdominal: Soft, distended with some ascites, not tight, nontender.There are no masses palpable. Extremities: no edema Neurological: Alert and oriented to person place and time. Skin: Skin is warm and dry. No rashes noted. Psychiatric: Normal mood and affect. Behavior is normal.   ASSESSMENT AND PLAN: 55 y/o female with a history of decompensated cirrhosis, history of large esophageal varices in the past, managed with band ligation given not a candidate for beta blockade in light of ascites. No complaints today, last EGD one year ago. I have discussed risks / benefits of EGD and anesthesia and she wishes to proceed today. Further recommendations pending results, all questions  answered.  Lu Verne Cellar, MD [facility] Gastroenterology

## 2020-08-07 NOTE — Anesthesia Postprocedure Evaluation (Signed)
Anesthesia Post Note  Patient: Mackenzi Krogh  Procedure(s) Performed: ESOPHAGOGASTRODUODENOSCOPY (EGD) WITH PROPOFOL (N/A ) BIOPSY     Patient location during evaluation: Endoscopy Anesthesia Type: MAC Level of consciousness: awake Pain management: pain level controlled Vital Signs Assessment: post-procedure vital signs reviewed and stable Respiratory status: spontaneous breathing, nonlabored ventilation, respiratory function stable and patient connected to nasal cannula oxygen Cardiovascular status: stable and blood pressure returned to baseline Postop Assessment: no apparent nausea or vomiting Anesthetic complications: no   No complications documented.  Last Vitals:  Vitals:   08/07/20 0820 08/07/20 0832  BP: 119/77 121/83  Pulse: (!) 111 (!) 103  Resp: (!) 24 (!) 26  Temp: 36.7 C   SpO2: 98% 96%    Last Pain:  Vitals:   08/07/20 0820  TempSrc: Oral  PainSc: 0-No pain                 Sostenes Kauffmann P [place]

## 2020-08-07 NOTE — Transfer of Care (Signed)
Immediate Anesthesia Transfer of Care Note  Patient: Katie Holland  Procedure(s) Performed: ESOPHAGOGASTRODUODENOSCOPY (EGD) WITH PROPOFOL (N/A ) BIOPSY  Patient Location: PACU  Anesthesia Type:MAC  Level of Consciousness: drowsy and patient cooperative  Airway & Oxygen Therapy: Patient Spontanous Breathing and Patient connected to face mask oxygen  Post-op Assessment: Report given to RN, Post -op Vital signs reviewed and stable and Patient moving all extremities X 4  Post vital signs: Reviewed and stable  Last Vitals:  Vitals Value Taken Time  BP    Temp    Pulse 117 08/07/20 0810  Resp 26 08/07/20 0810  SpO2 98 % 08/07/20 0810  Vitals shown include unvalidated device data.  Last Pain:  Vitals:   08/07/20 0636  TempSrc: Oral  PainSc: 0-No pain         Complications: No complications documented.

## 2020-08-07 NOTE — Anesthesia Procedure Notes (Signed)
Procedure Name: MAC Date/Time: 08/07/2020 7:30 AM Performed by: Michele Rockers, CRNA Pre-anesthesia Checklist: Patient identified, Emergency Drugs available, Suction available, Timeout performed and Patient being monitored Patient Re-evaluated:Patient Re-evaluated prior to induction Oxygen Delivery Method: Simple face mask

## 2020-08-07 NOTE — Interval H&P Note (Signed)
History and Physical Interval Note:  08/07/2020 7:20 AM  Katie Holland  has presented today for surgery, with the diagnosis of cirrhosis, esophageal varices.  The various methods of treatment have been discussed with the patient and family. After consideration of risks, benefits and other options for treatment, the patient has consented to  Procedure(s): ESOPHAGOGASTRODUODENOSCOPY (EGD) WITH PROPOFOL (N/A) ESOPHAGEAL BANDING (N/A) as a surgical intervention.  The patient's history has been reviewed, patient examined, no change in status, stable for surgery.  I have reviewed the patient's chart and labs.  Questions were answered to the patient's satisfaction.     [place]

## 2020-08-07 NOTE — Discharge Instructions (Signed)
Monitored Anesthesia Care, Care After This sheet gives you information about how to care for yourself after your procedure. Your health care provider may also give you more specific instructions. If you have problems or questions, contact your health care provider. What can I expect after the procedure? After the procedure, it is common to have:  Tiredness.  Forgetfulness about what happened after the procedure.  Impaired judgment for important decisions.  Nausea or vomiting.  Some difficulty with balance. Follow these instructions at home: For the time period you were told by your health care provider:  Rest as needed.  Do not participate in activities where you could fall or become injured.  Do not drive or use machinery.  Do not drink alcohol.  Do not take sleeping pills or medicines that cause drowsiness.  Do not make important decisions or sign legal documents.  Do not take care of children on your own.      Eating and drinking  Follow the diet that is recommended by your health care provider.  Drink enough fluid to keep your urine pale yellow.  If you vomit: ? Drink water, juice, or soup when you can drink without vomiting. ? Make sure you have little or no nausea before eating solid foods. General instructions  Have a responsible adult stay with you for the time you are told. It is important to have someone help care for you until you are awake and alert.  Take over-the-counter and prescription medicines only as told by your health care provider.  If you have sleep apnea, surgery and certain medicines can increase your risk for breathing problems. Follow instructions from your health care provider about wearing your sleep device: ? Anytime you are sleeping, including during daytime naps. ? While taking prescription pain medicines, sleeping medicines, or medicines that make you drowsy.  Avoid smoking.  Keep all follow-up visits as told by your health care  provider. This is important. Contact a health care provider if:  You keep feeling nauseous or you keep vomiting.  You feel light-headed.  You are still sleepy or having trouble with balance after 24 hours.  You develop a rash.  You have a fever.  You have redness or swelling around the IV site. Get help right away if:  You have trouble breathing.  You have new-onset confusion at home. Summary  For several hours after your procedure, you may feel tired. You may also be forgetful and have poor judgment.  Have a responsible adult stay with you for the time you are told. It is important to have someone help care for you until you are awake and alert.  Rest as told. Do not drive or operate machinery. Do not drink alcohol or take sleeping pills.  Get help right away if you have trouble breathing, or if you suddenly become confused. This information is not intended to replace advice given to you by your health care provider. Make sure you discuss any questions you have with your health care provider. Document Revised: 02/23/2020 Document Reviewed: 05/12/2019 Elsevier Patient Education  2021 [place]. Upper Endoscopy, Adult, Care After This sheet gives you information about how to care for yourself after your procedure. Your health care provider may also give you more specific instructions. If you have problems or questions, contact your health care provider. What can I expect after the procedure? After the procedure, it is common to have:  A sore throat.  Mild stomach pain or discomfort.  Bloating.  Nausea.  Follow these instructions at home:  Follow instructions from your health care provider about what to eat or drink after your procedure.  Return to your normal activities as told by your health care provider. Ask your health care provider what activities are safe for you.  Take over-the-counter and prescription medicines only as told by your health care  provider.  If you were given a sedative during the procedure, it can affect you for several hours. Do not drive or operate machinery until your health care provider says that it is safe.  Keep all follow-up visits as told by your health care provider. This is important.   Contact a health care provider if you have:  A sore throat that lasts longer than one day.  Trouble swallowing. Get help right away if:  You vomit blood or your vomit looks like coffee grounds.  You have: ? A fever. ? Bloody, black, or tarry stools. ? A severe sore throat or you cannot swallow. ? Difficulty breathing. ? Severe pain in your chest or abdomen. Summary  After the procedure, it is common to have a sore throat, mild stomach discomfort, bloating, and nausea.  If you were given a sedative during the procedure, it can affect you for several hours. Do not drive or operate machinery until your health care provider says that it is safe.  Follow instructions from your health care provider about what to eat or drink after your procedure.  Return to your normal activities as told by your health care provider. This information is not intended to replace advice given to you by your health care provider. Make sure you discuss any questions you have with your health care provider. Document Revised: 06/07/2019 Document Reviewed: 11/09/2017 Elsevier Patient Education  2021 [employer].

## 2020-08-07 NOTE — Op Note (Signed)
[facility] Patient Name: Katie Holland Procedure Date: 08/07/2020 MRN: [id number] Attending MD: Carlota Raspberry. Nick Stults , MD Date of Birth: Mar 01, 1966 CSN: [id number] Age: 55 Admit Type: Inpatient Procedure:                Upper GI endoscopy Indications:              follow-up of esophageal varices - history of                            bleeding varices with 25 bands placed since                            November 2019, last 2 exams showed eradication,                            last exam one year ago. Refractory ascites, not on                            beta blockade Providers:                Carlota Raspberry. Havery Moros, MD, Cleda Daub, RN Referring MD:              Medicines:                Monitored Anesthesia Care Complications:            No immediate complications. Estimated blood loss:                            Minimal. Estimated Blood Loss:     Estimated blood loss was minimal. Procedure:                Pre-Anesthesia Assessment:                           - Prior to the procedure, a History and Physical                            was performed, and patient medications and                            allergies were reviewed. The patient's tolerance of                            previous anesthesia was also reviewed. The risks                            and benefits of the procedure and the sedation                            options and risks were discussed with the patient.                            All questions were answered, and informed consent  was obtained. Prior Anticoagulants: The patient has                            taken no previous anticoagulant or antiplatelet                            agents. ASA Grade Assessment: III - A patient with                            severe systemic disease. After reviewing the risks                            and benefits, the patient was deemed in                            satisfactory  condition to undergo the procedure.                           After obtaining informed consent, the endoscope was                            passed under direct vision. Throughout the                            procedure, the patient's blood pressure, pulse, and                            oxygen saturations were monitored continuously. The                            [id number]-[id number] ([id number]) was introduced through the                            mouth, and advanced to the second part of duodenum.                            The upper GI endoscopy was accomplished without                            difficulty. The patient tolerated the procedure                            well. Scope In: Scope Out: Findings:      Esophagogastric landmarks were identified: the Z-line was found at 37       cm, the gastroesophageal junction was found at 37 cm and the upper       extent of the gastric folds was found at 37 cm from the incisors.      There was scarring in the mid and lower esophagus from prior banding. No       appreciable varices noted. The exam of the esophagus was otherwise       normal.      Patchy mild inflammation characterized by an erosion and patchy erythema       was found in the gastric antrum.  The exam of the stomach was otherwise normal. No gastric varices.      Biopsies were taken with a cold forceps for Helicobacter pylori testing.      A single diminutive sessile polyp was found in the duodenal bulb. The       polyp was removed with a cold biopsy forceps. Resection and retrieval       were complete.      The exam of the duodenum was otherwise normal. Impression:               - Esophagogastric landmarks identified.                           - Normal esophagus otherwise - scarring from prior                            banding noted but appreciable varices.                           - Antral gastritis with an erosion.                           - Normal stomach otherwise - biopsies  taken to rule                            out H pylori                           - A single duodenal polyp. Resected and retrieved.                           - Normal duodenum otherwise Moderate Sedation:      No moderate sedation, case performed with MAC Recommendation:           - Patient has a contact number available for                            emergencies. The signs and symptoms of potential                            delayed complications were discussed with the                            patient. Return to normal activities tomorrow.                            Written discharge instructions were provided to the                            patient.                           - Resume previous diet.                           - Continue present medications.                           -  Start omeprazole 7m once daily for 4 weeks to                            treat gastritis                           - No NSAIDs                           - Await pathology results.                           - Repeat upper endoscopy in 1 year for surveillance. Procedure Code(s):        --- Professional ---                           [id number] Esophagogastroduodenoscopy, flexible,                            transoral; with biopsy, single or multiple Diagnosis Code(s):        --- Professional ---                           K29.70, Gastritis, unspecified, without bleeding                           K31.7, Polyp of stomach and duodenum                           I85.00, Esophageal varices without bleeding CPT copyright 2019 American Medical Association. All rights reserved. The codes documented in this report are preliminary and upon coder review may  be revised to meet current compliance requirements. SRemo LippsP. Amritpal Shropshire, MD 08/07/2020 8:11:06 AM This report has been signed electronically. Number of Addenda: 0

## 2020-08-08 ENCOUNTER — Encounter (HOSPITAL_COMMUNITY): Payer: Self-pay | Admitting: Gastroenterology

## 2020-08-10 LAB — SURGICAL PATHOLOGY

## 2020-08-15 ENCOUNTER — Other Ambulatory Visit: Payer: Self-pay

## 2020-08-15 ENCOUNTER — Encounter: Payer: Self-pay | Admitting: Gastroenterology

## 2020-08-15 DIAGNOSIS — D509 Iron deficiency anemia, unspecified: Secondary | ICD-10-CM

## 2020-08-15 DIAGNOSIS — K7031 Alcoholic cirrhosis of liver with ascites: Secondary | ICD-10-CM

## 2020-08-15 DIAGNOSIS — K2970 Gastritis, unspecified, without bleeding: Secondary | ICD-10-CM

## 2020-08-15 NOTE — Progress Notes (Signed)
Per Dr. Havery Moros patient needs additional labs. See results 08-15-20

## 2020-08-17 ENCOUNTER — Telehealth: Payer: Self-pay | Admitting: Gastroenterology

## 2020-08-17 DIAGNOSIS — K7031 Alcoholic cirrhosis of liver with ascites: Secondary | ICD-10-CM

## 2020-08-17 NOTE — Telephone Encounter (Signed)
Okay to schedule large volume paracentesis? Last paracentesis on 07/27/20, 8.4 L removed. Please advise, thank you.

## 2020-08-17 NOTE — Telephone Encounter (Signed)
Spoke with patient in regards to her appointment information. Patient verbalized understanding and had no concerns at the end of the call.

## 2020-08-17 NOTE — Telephone Encounter (Signed)
Inbound call from patient wanting to schedule for her paracentesis please.

## 2020-08-17 NOTE — Telephone Encounter (Signed)
Patient has been scheduled for a paracentesis at [facility] on Monday, 08/20/20 at 1 PM, arrive at 12:45 PM.   Lm on vm for patient to return call for appointment information.

## 2020-08-17 NOTE — Telephone Encounter (Signed)
Yes that's fine. She should have albumin if > 5 L removed. Please send fluid for cell count. Thanks

## 2020-08-20 ENCOUNTER — Other Ambulatory Visit: Payer: Self-pay

## 2020-08-20 ENCOUNTER — Ambulatory Visit (HOSPITAL_COMMUNITY)
Admission: RE | Admit: 2020-08-20 | Discharge: 2020-08-20 | Disposition: A | Discharge status: Home / Self Care | Payer: Medicare HMO | Source: Ambulatory Visit | Attending: Gastroenterology | Admitting: Gastroenterology

## 2020-08-20 DIAGNOSIS — R188 Other ascites: Secondary | ICD-10-CM | POA: Diagnosis not present

## 2020-08-20 DIAGNOSIS — K7031 Alcoholic cirrhosis of liver with ascites: Secondary | ICD-10-CM

## 2020-08-20 DIAGNOSIS — R69 Illness, unspecified: Secondary | ICD-10-CM | POA: Diagnosis not present

## 2020-08-20 HISTORY — PX: IR PARACENTESIS: IMG2679

## 2020-08-20 LAB — BODY FLUID CELL COUNT WITH DIFFERENTIAL
Eos, Fluid: 0 %
Lymphs, Fluid: 18 %
Monocyte-Macrophage-Serous Fluid: 60 % (ref 50–90)
Neutrophil Count, Fluid: 22 % (ref 0–25)
Total Nucleated Cell Count, Fluid: 48 cu mm (ref 0–1000)

## 2020-08-20 MED ORDER — ALBUMIN HUMAN 25 % IV SOLN
INTRAVENOUS | Status: AC
  Filled 2020-08-20: qty 100

## 2020-08-20 MED ORDER — ALBUMIN HUMAN 25 % IV SOLN
INTRAVENOUS | Status: AC
  Administered 2020-08-20: 25 g via INTRAVENOUS
  Filled 2020-08-20: qty 200

## 2020-08-20 MED ORDER — ALBUMIN HUMAN 25 % IV SOLN
50.0000 g | Freq: Once | INTRAVENOUS | Status: AC
  Administered 2020-08-20: 50 g via INTRAVENOUS

## 2020-08-20 MED ORDER — LIDOCAINE HCL (PF) 1 % IJ SOLN
INTRAMUSCULAR | Status: AC | PRN
  Administered 2020-08-20: 10 mL

## 2020-08-20 MED ORDER — LIDOCAINE HCL 1 % IJ SOLN
INTRAMUSCULAR | Status: AC
  Filled 2020-08-20: qty 20

## 2020-08-20 MED ORDER — ALBUMIN HUMAN 25 % IV SOLN
25.0000 g | Freq: Once | INTRAVENOUS | Status: AC
  Filled 2020-08-20: qty 100

## 2020-08-20 NOTE — Procedures (Signed)
Ultrasound-guided diagnostic and therapeutic paracentesis performed yielding 9.8 liters of straw colored fluid.  Fluid was sent to lab for analysis. No immediate complications. EBL is none.

## 2020-08-21 LAB — PATHOLOGIST SMEAR REVIEW

## 2020-08-24 ENCOUNTER — Telehealth: Payer: Self-pay

## 2020-08-24 NOTE — Telephone Encounter (Signed)
Patient returning nurses call

## 2020-08-24 NOTE — Telephone Encounter (Signed)
-----   Message from Yevette Edwards, RN sent at 07/20/2020 11:56 AM EST ----- Regarding: Labs Repeat CBC, order in epic.

## 2020-08-24 NOTE — Telephone Encounter (Signed)
Lm on vm for patient to return call 

## 2020-08-24 NOTE — Telephone Encounter (Signed)
Spoke with patient to remind her that she is due for lab work at this time. Patient is aware that no appt is necessary, she is aware that she can stop by the lab in the basement of our office building between 7:30 AM - 5 PM, Monday through Friday. Patient verbalized understanding and had no concerns at the end of the call.

## 2020-08-29 ENCOUNTER — Telehealth: Payer: Self-pay

## 2020-08-29 NOTE — Telephone Encounter (Signed)
-----   Message from Yetta Flock, MD sent at 08/29/2020  2:05 PM EST ----- Regarding: RE: refill of Spironolactone Jan I don't think she is taking it, at least the last time we spoke with her about this she was not taking it. If she is, please let me know, she would need follow up labs. Thanks  ----- Message ----- From: Roetta Sessions, CMA Sent: 08/29/2020   9:08 AM EST To: Yetta Flock, MD Subject: refill of Spironolactone                       Ok to refill spironolactone 100 mg once daily

## 2020-08-29 NOTE — Telephone Encounter (Signed)
Called and spoke to patient.  She did not request the refill of spironolactone. She is not taking lasix or aldactone currently.

## 2020-08-30 ENCOUNTER — Other Ambulatory Visit (INDEPENDENT_AMBULATORY_CARE_PROVIDER_SITE_OTHER): Payer: Medicare HMO

## 2020-08-30 DIAGNOSIS — K2970 Gastritis, unspecified, without bleeding: Secondary | ICD-10-CM

## 2020-08-30 DIAGNOSIS — D509 Iron deficiency anemia, unspecified: Secondary | ICD-10-CM

## 2020-08-30 DIAGNOSIS — K7031 Alcoholic cirrhosis of liver with ascites: Secondary | ICD-10-CM

## 2020-08-30 DIAGNOSIS — R69 Illness, unspecified: Secondary | ICD-10-CM | POA: Diagnosis not present

## 2020-08-30 LAB — IBC + FERRITIN
Ferritin: 13.7 ng/mL (ref 10.0–291.0)
Iron: 42 ug/dL (ref 42–145)
Saturation Ratios: 13.8 % — ABNORMAL LOW (ref 20.0–50.0)
Transferrin: 218 mg/dL (ref 212.0–360.0)

## 2020-08-30 LAB — CBC WITH DIFFERENTIAL/PLATELET
Basophils Absolute: 0 10*3/uL (ref 0.0–0.1)
Basophils Relative: 1.1 % (ref 0.0–3.0)
Eosinophils Absolute: 0.2 10*3/uL (ref 0.0–0.7)
Eosinophils Relative: 5.5 % — ABNORMAL HIGH (ref 0.0–5.0)
HCT: 31.7 % — ABNORMAL LOW (ref 36.0–46.0)
Hemoglobin: 10 g/dL — ABNORMAL LOW (ref 12.0–15.0)
Lymphocytes Relative: 33.4 % (ref 12.0–46.0)
Lymphs Abs: 1.2 10*3/uL (ref 0.7–4.0)
MCHC: 31.7 g/dL (ref 30.0–36.0)
MCV: 75.3 fl — ABNORMAL LOW (ref 78.0–100.0)
Monocytes Absolute: 0.6 10*3/uL (ref 0.1–1.0)
Monocytes Relative: 16.6 % — ABNORMAL HIGH (ref 3.0–12.0)
Neutro Abs: 1.5 10*3/uL (ref 1.4–7.7)
Neutrophils Relative %: 43.4 % (ref 43.0–77.0)
Platelets: 157 10*3/uL (ref 150.0–400.0)
RBC: 4.21 Mil/uL (ref 3.87–5.11)
RDW: 25 % — ABNORMAL HIGH (ref 11.5–15.5)
WBC: 3.5 10*3/uL — ABNORMAL LOW (ref 4.0–10.5)

## 2020-08-30 LAB — VITAMIN B12: Vitamin B-12: 182 pg/mL — ABNORMAL LOW (ref 211–911)

## 2020-08-30 NOTE — Telephone Encounter (Signed)
Okay thanks Jan. I would hold off for refilling it then. Thanks for clarifying.

## 2020-08-31 ENCOUNTER — Other Ambulatory Visit: Payer: Self-pay

## 2020-08-31 DIAGNOSIS — K7031 Alcoholic cirrhosis of liver with ascites: Secondary | ICD-10-CM

## 2020-08-31 DIAGNOSIS — D509 Iron deficiency anemia, unspecified: Secondary | ICD-10-CM

## 2020-09-10 ENCOUNTER — Telehealth: Payer: Self-pay | Admitting: Family Medicine

## 2020-09-10 ENCOUNTER — Telehealth: Payer: Self-pay | Admitting: Gastroenterology

## 2020-09-10 DIAGNOSIS — K7031 Alcoholic cirrhosis of liver with ascites: Secondary | ICD-10-CM

## 2020-09-10 NOTE — Telephone Encounter (Signed)
Patient is scheduled for a paracentesis at [facility] on Thursday, 09/13/20 at 1 PM, arrive at 12:45 PM.   Lm on vm for patient to return call.

## 2020-09-10 NOTE — Telephone Encounter (Signed)
Left message for patient to call back and schedule Medicare Annual Wellness Visit (AWV) either virtually or in office. No detailed message   Last AWVI  please schedule at anytime with [facility] Nurse Health Advisor 1 or 2   This should be a 45 minute visit. Patient also needs appointment with PCP last appointment 06/11/18

## 2020-09-10 NOTE — Telephone Encounter (Signed)
Yes can do another large volume para, please give albumin if > 5 L removed and send fluid for cell count / diff. Thanks

## 2020-09-10 NOTE — Telephone Encounter (Signed)
Inbound call from patient requesting a call back please to schedule paracentesis.

## 2020-09-10 NOTE — Telephone Encounter (Signed)
Dr. Havery Moros, okay to order large volume paracentesis? Last paracentesis was on 08/20/20, 9.8L removed. Please advise, thanks.

## 2020-09-10 NOTE — Telephone Encounter (Signed)
Patient called back and was informed of scheduled appt.

## 2020-09-11 NOTE — Telephone Encounter (Signed)
Noted, thank you

## 2020-09-13 ENCOUNTER — Other Ambulatory Visit: Payer: Self-pay

## 2020-09-13 ENCOUNTER — Ambulatory Visit (HOSPITAL_COMMUNITY)
Admission: RE | Admit: 2020-09-13 | Discharge: 2020-09-13 | Disposition: A | Discharge status: Home / Self Care | Payer: Medicare HMO | Source: Ambulatory Visit | Attending: Gastroenterology | Admitting: Gastroenterology

## 2020-09-13 DIAGNOSIS — R69 Illness, unspecified: Secondary | ICD-10-CM | POA: Diagnosis not present

## 2020-09-13 DIAGNOSIS — K7031 Alcoholic cirrhosis of liver with ascites: Secondary | ICD-10-CM | POA: Insufficient documentation

## 2020-09-13 DIAGNOSIS — R188 Other ascites: Secondary | ICD-10-CM | POA: Diagnosis not present

## 2020-09-13 HISTORY — PX: IR PARACENTESIS: IMG2679

## 2020-09-13 LAB — BODY FLUID CELL COUNT WITH DIFFERENTIAL
Eos, Fluid: 0 %
Lymphs, Fluid: 22 %
Monocyte-Macrophage-Serous Fluid: 68 % (ref 50–90)
Neutrophil Count, Fluid: 10 % (ref 0–25)
Total Nucleated Cell Count, Fluid: 26 cu mm (ref 0–1000)

## 2020-09-13 MED ORDER — LIDOCAINE HCL 1 % IJ SOLN
INTRAMUSCULAR | Status: AC | PRN
  Administered 2020-09-13 (×2): 10 mL

## 2020-09-13 MED ORDER — LIDOCAINE HCL 1 % IJ SOLN
INTRAMUSCULAR | Status: AC
  Filled 2020-09-13: qty 20

## 2020-09-13 NOTE — Procedures (Signed)
PROCEDURE SUMMARY:  Successful US guided paracentesis from LLQ.  Yielded 7.8 L of clear yellow fluid.  No immediate complications.  Pt tolerated well.   Specimen was sent for labs.  EBL < 63mL  Ascencion Dike PA-C 09/13/2020 12:47 PM

## 2020-09-14 LAB — PATHOLOGIST SMEAR REVIEW

## 2020-09-27 ENCOUNTER — Telehealth: Payer: Self-pay | Admitting: Gastroenterology

## 2020-09-27 DIAGNOSIS — K7031 Alcoholic cirrhosis of liver with ascites: Secondary | ICD-10-CM

## 2020-09-27 NOTE — Telephone Encounter (Signed)
Patient called in requesting a call back to schedule an appointment for paracentesis

## 2020-09-27 NOTE — Telephone Encounter (Signed)
Yes that's fine. Please send cell count and give albumin if > 5 L removed. Thanks

## 2020-09-27 NOTE — Telephone Encounter (Signed)
Patient is scheduled for a paracentesis at [facility] on Thursday, 10/04/20 at 1 PM, arrive by 12:45 PM. Spoke with patient, she is aware of appt information and had no concerns at the end of the call.

## 2020-09-27 NOTE — Telephone Encounter (Signed)
Last paracentesis on 09/13/20, 7.8 L removed. Okay to schedule paracentesis? Please advise, thank you.

## 2020-10-04 ENCOUNTER — Other Ambulatory Visit: Payer: Self-pay

## 2020-10-04 ENCOUNTER — Ambulatory Visit (HOSPITAL_COMMUNITY)
Admission: RE | Admit: 2020-10-04 | Discharge: 2020-10-04 | Disposition: A | Discharge status: Home / Self Care | Payer: Medicare HMO | Source: Ambulatory Visit | Attending: Gastroenterology | Admitting: Gastroenterology

## 2020-10-04 DIAGNOSIS — R69 Illness, unspecified: Secondary | ICD-10-CM | POA: Diagnosis not present

## 2020-10-04 DIAGNOSIS — K7031 Alcoholic cirrhosis of liver with ascites: Secondary | ICD-10-CM | POA: Diagnosis present

## 2020-10-04 DIAGNOSIS — R188 Other ascites: Secondary | ICD-10-CM | POA: Diagnosis not present

## 2020-10-04 HISTORY — PX: IR PARACENTESIS: IMG2679

## 2020-10-04 LAB — BODY FLUID CELL COUNT WITH DIFFERENTIAL
Eos, Fluid: 0 %
Lymphs, Fluid: 12 %
Monocyte-Macrophage-Serous Fluid: 67 % (ref 50–90)
Neutrophil Count, Fluid: 21 % (ref 0–25)
Total Nucleated Cell Count, Fluid: 69 cu mm (ref 0–1000)

## 2020-10-04 MED ORDER — LIDOCAINE HCL (PF) 1 % IJ SOLN
INTRAMUSCULAR | Status: DC | PRN
  Administered 2020-10-04: 30 mL

## 2020-10-04 MED ORDER — ALBUMIN HUMAN 25 % IV SOLN: 50.0000 g | Freq: Once | INTRAVENOUS | Status: AC

## 2020-10-04 MED ORDER — LIDOCAINE HCL 1 % IJ SOLN
INTRAMUSCULAR | Status: AC
  Filled 2020-10-04: qty 20

## 2020-10-04 MED ORDER — ALBUMIN HUMAN 25 % IV SOLN
INTRAVENOUS | Status: AC
  Administered 2020-10-04: 50 g
  Filled 2020-10-04: qty 200

## 2020-10-04 NOTE — Procedures (Signed)
PROCEDURE SUMMARY:  Successful US guided paracentesis from right lateral abdomen.  Yielded 6.2 liters of yellow fluid.  No immediate complications.  Pt tolerated well.   Specimen was sent for labs.  EBL < 24mL  Docia Barrier PA-C 10/04/2020 3:33 PM

## 2020-10-05 LAB — PATHOLOGIST SMEAR REVIEW

## 2020-10-23 ENCOUNTER — Telehealth: Payer: Self-pay | Admitting: Gastroenterology

## 2020-10-23 DIAGNOSIS — K7031 Alcoholic cirrhosis of liver with ascites: Secondary | ICD-10-CM

## 2020-10-23 NOTE — Telephone Encounter (Signed)
Last paracentesis on 10/04/20, 6.4 L removed. No diuretics. Okay to schedule paracentesis? Please advise, thanks.

## 2020-10-23 NOTE — Telephone Encounter (Signed)
Patient has been scheduled for a paracentesis at [facility] on Thursday, 10/25/20 at 1 PM, arrive at 12:45 PM.   Spoke with patient in regards to appointment information. Patient verbalized understanding and she had no concerns at the end of the call.

## 2020-10-23 NOTE — Telephone Encounter (Signed)
Yes that is okay, can do large volume paracentesis, albumin if > 5 L, send cell count to rule out SBP. She has not been on diuretics, has been noncompliant both with taking them and follow up lab draws. Thanks

## 2020-10-23 NOTE — Telephone Encounter (Signed)
Inbound call from patient requesting an appointment for paracentesis. Last paracentesis was 10/04/2020. Best contact number [phone].

## 2020-10-25 ENCOUNTER — Other Ambulatory Visit: Payer: Self-pay

## 2020-10-25 ENCOUNTER — Ambulatory Visit (HOSPITAL_COMMUNITY)
Admission: RE | Admit: 2020-10-25 | Discharge: 2020-10-25 | Disposition: A | Discharge status: Home / Self Care | Payer: Medicare HMO | Source: Ambulatory Visit | Attending: Gastroenterology | Admitting: Gastroenterology

## 2020-10-25 DIAGNOSIS — R69 Illness, unspecified: Secondary | ICD-10-CM | POA: Diagnosis not present

## 2020-10-25 DIAGNOSIS — K7031 Alcoholic cirrhosis of liver with ascites: Secondary | ICD-10-CM | POA: Diagnosis present

## 2020-10-25 HISTORY — PX: IR PARACENTESIS: IMG2679

## 2020-10-25 LAB — BODY FLUID CELL COUNT WITH DIFFERENTIAL
Eos, Fluid: 0 %
Lymphs, Fluid: 21 %
Monocyte-Macrophage-Serous Fluid: 64 % (ref 50–90)
Neutrophil Count, Fluid: 15 % (ref 0–25)
Total Nucleated Cell Count, Fluid: 46 cu mm (ref 0–1000)

## 2020-10-25 MED ORDER — ALBUMIN HUMAN 25 % IV SOLN
INTRAVENOUS | Status: AC
  Filled 2020-10-25: qty 200

## 2020-10-25 MED ORDER — LIDOCAINE HCL (PF) 1 % IJ SOLN
INTRAMUSCULAR | Status: DC | PRN
  Administered 2020-10-25: 30 mL

## 2020-10-25 MED ORDER — LIDOCAINE HCL 1 % IJ SOLN
INTRAMUSCULAR | Status: AC
  Filled 2020-10-25: qty 20

## 2020-10-25 MED ORDER — ALBUMIN HUMAN 25 % IV SOLN
50.0000 g | Freq: Once | INTRAVENOUS | Status: DC
  Filled 2020-10-25: qty 200

## 2020-10-25 NOTE — Procedures (Signed)
PROCEDURE SUMMARY:  Successful image-guided paracentesis from the right lateral abdomen.  Yielded 7.8 liters of clear yellow fluid.  No immediate complications.  EBL = 0 mL. Patient tolerated well.   Specimen was sent for labs.  Please see imaging section of Epic for full dictation.   Claris Pong Novia Lansberry PA-C 10/25/2020 1:03 PM

## 2020-10-26 LAB — PATHOLOGIST SMEAR REVIEW

## 2020-10-29 ENCOUNTER — Telehealth: Payer: Self-pay

## 2020-10-29 NOTE — Telephone Encounter (Signed)
Katie Holland is she calling for this or her pharmacy? Last I checked with her she was not taking it, she has not been compliant with it nor was following up for labs. Thanks

## 2020-10-29 NOTE — Telephone Encounter (Signed)
rec'd refill request for lasix 20 mg bid.  OK to refill? Thank you

## 2020-10-30 NOTE — Telephone Encounter (Signed)
Called and spoke to patient.she did not request the refill but She would like to take lasix but said she doesn't have transportation and wondered if we could send it to a mail order pharmacy. I instructed her to call the number on her insurance card and ask them what Mail order Pharmacy she is supposed to use. She said she would do that and call me back with the information.

## 2020-10-30 NOTE — Telephone Encounter (Signed)
Jan I will only give her lasix if she is compliant with blood draws and compliant with actually taking it, she has not been able to do that in the past. If she is willing to be compliant with blood draws I will consider it

## 2020-10-31 ENCOUNTER — Telehealth: Payer: Self-pay

## 2020-10-31 NOTE — Telephone Encounter (Signed)
Spoke with patient to remind her that she is due for repeat labs at this time. No appointment is necessary. Patient is aware that she can stop by the lab in the basement at her convenience between 7:30 AM - 5 PM, Monday through Friday. Patient verbalized understanding and had no concerns at the end of the call.   

## 2020-10-31 NOTE — Telephone Encounter (Signed)
-----   Message from Yevette Edwards, RN sent at 08/31/2020 11:09 AM EST ----- Regarding: Labs Repeat CBC, order in epic.

## 2020-11-05 NOTE — Telephone Encounter (Signed)
Spoke with patient, she states that she has discussed with her family and would like to have stent placed.   Last paracentesis was 10/25/20, 7.8 L removed. Ok to scheduled paracentesis? Please advise, thanks.

## 2020-11-05 NOTE — Addendum Note (Signed)
Addended by: Yevette Edwards on: 11/05/2020 01:13 PM   Modules accepted: Orders

## 2020-11-05 NOTE — Telephone Encounter (Signed)
Patient is scheduled for a paracentesis at [facility] on Wednesday, 11/07/20 at 2 PM, arriving at 1:45 PM. Patient has been scheduled for a follow up with Dr. Havery Moros on Thursday, 11/08/20 at 1:20 PM. Patient is aware of all appointment information, she verbalized understanding and had no concerns at the end of the call.

## 2020-11-05 NOTE — Telephone Encounter (Signed)
Okay to do another large volume paracentesis with albumin administration if more than 5 L removed.   Not sure what she means about having stent placed? Is she referring to TIPS? She has not seen me in a while in the office, would be good to see her back in clinic to discuss options. Thanks

## 2020-11-05 NOTE — Telephone Encounter (Signed)
Patient calling schedule another paracentesis also inquired about getting stent placed please advise.

## 2020-11-06 NOTE — Telephone Encounter (Signed)
Patient is scheduled for OV on Thursday, 5-19

## 2020-11-07 ENCOUNTER — Ambulatory Visit (HOSPITAL_COMMUNITY)
Admission: RE | Admit: 2020-11-07 | Discharge: 2020-11-07 | Disposition: A | Discharge status: Home / Self Care | Payer: Medicare HMO | Source: Ambulatory Visit | Attending: Gastroenterology | Admitting: Gastroenterology

## 2020-11-07 ENCOUNTER — Other Ambulatory Visit: Payer: Self-pay

## 2020-11-07 DIAGNOSIS — R69 Illness, unspecified: Secondary | ICD-10-CM | POA: Diagnosis not present

## 2020-11-07 DIAGNOSIS — K7031 Alcoholic cirrhosis of liver with ascites: Secondary | ICD-10-CM | POA: Insufficient documentation

## 2020-11-07 HISTORY — PX: IR PARACENTESIS: IMG2679

## 2020-11-07 MED ORDER — LIDOCAINE HCL (PF) 1 % IJ SOLN
INTRAMUSCULAR | Status: AC
  Filled 2020-11-07: qty 30

## 2020-11-07 MED ORDER — ALBUMIN HUMAN 25 % IV SOLN
INTRAVENOUS | Status: AC
  Administered 2020-11-07: 50 g via INTRAVENOUS
  Filled 2020-11-07: qty 200

## 2020-11-07 MED ORDER — ALBUMIN HUMAN 25 % IV SOLN: 50.0000 g | Freq: Once | INTRAVENOUS | Status: AC

## 2020-11-08 ENCOUNTER — Ambulatory Visit (INDEPENDENT_AMBULATORY_CARE_PROVIDER_SITE_OTHER): Payer: Medicare HMO | Admitting: Gastroenterology

## 2020-11-08 ENCOUNTER — Other Ambulatory Visit: Payer: Self-pay

## 2020-11-08 VITALS — BP 100/62 | HR 93 | Ht 64.0 in | Wt 123.0 lb

## 2020-11-08 DIAGNOSIS — K7031 Alcoholic cirrhosis of liver with ascites: Secondary | ICD-10-CM

## 2020-11-08 DIAGNOSIS — I8501 Esophageal varices with bleeding: Secondary | ICD-10-CM

## 2020-11-08 DIAGNOSIS — R609 Edema, unspecified: Secondary | ICD-10-CM

## 2020-11-08 DIAGNOSIS — R69 Illness, unspecified: Secondary | ICD-10-CM | POA: Diagnosis not present

## 2020-11-08 MED ORDER — SPIRONOLACTONE 100 MG PO TABS: 100.0000 mg | ORAL_TABLET | Freq: Every day | ORAL | 3 refills | Status: DC

## 2020-11-08 MED ORDER — FUROSEMIDE 20 MG PO TABS: 20.0000 mg | ORAL_TABLET | Freq: Two times a day (BID) | ORAL | 3 refills | Status: DC

## 2020-11-08 NOTE — Progress Notes (Signed)
HPI :  54y/o female here for a follow up visit for decompensatedalcoholiccirrhosis.  I last saw her in February for surveillance endoscopy for history of variceal bleeding status post band ligation.  She had a few admissions at the end of 2019 and required over 25 bands to eradicate varices.  Her EGD showed eradication of varices in February.  Main issues she has dealt with has been ascites and lower extremity edema.  She historically has had a hard time with refractory ascites although has continued to drink alcohol for some time.  We had discussed this at length with her and she states she has been completely abstinent from alcohol for the past 6 to 8 months.  She is adamant she is been doing a good job with this.  Her ascites has been managed with large-volume paracentesis as needed, she typically acquired 1-2 of these per month.  She is following a low-sodium diet.  We have given her diuretics in the past however she has not been able to be compliant with taking the medicine or labs.  She is now hopeful to get back on the diuretics as she is tired of having so many frequent paracentesis.  She also inquires about TIPS.  She has not been on nonselective beta-blockade due to difficulty managing her ascites.  In general she seems to be doing pretty well outside of the ascites and lower extremity edema.  She denies any jaundice.  No abdominal pains.  There have been no issues with encephalopathy recently.  She is due for surveillance ultrasound this June, last ultrasound in January showed no evidence of HCC.  She had an ultrasound done in July for [facility] screening that was negative.   Most recent endoscopies: EGD 07/07/18 - 4 bands placed EGD 07/27/18 - 3 bands placed EGD 08/30/18 - 3 bands placed EGD 11/08/18 -no varices amenable to banding, portal hypertensivegastritis EGD 01/31/19 - portal hypertensive gastritis, no varices  CT angio abdomen 01/01/19 - large varices noted on imaging, cirrhosis  with ascites, R pleural effusion  EGD 08/01/19 - There was extensive scarring in the distal esophagus secondary to prior banding, but no obvious varices noted today. The exam of the esophagus was otherwise normal. Diffuse severely congested mucosa was found in the gastric antrum secondary to portal hypertension.. The exam of the stomach was otherwise normal. No gastric varices. The duodenal bulb and second portion of the duodenum were normal.   Colonoscopy 08/01/19 - Preparation of the colon was fair - most of the colon was well visualized - a small portion of the transverse colon, and then the splenic flexure / proximal descending colon had some residual stool that could not be cleared but nothing obvious noted in these areas. - Hemorrhoids found on perianal exam. - The examined portion of the ileum was normal. - Diffuse colonic congestion / edema in the entire examined colon from portal hypertension. - Internal hemorrhoids. - The examination was otherwise normal.   EGD 08/07/20: Esophagogastric landmarks identified. - Normal esophagus otherwise - scarring from prior banding noted but appreciable varices. - Antral gastritis with an erosion. - Normal stomach otherwise - biopsies taken to rule out H pylori - A single duodenal polyp. Resected and retrieved. - Normal duodenum otherwise  Repeat EGD in one year   RUQ [place] 07/17/20:IMPRESSION: 1.  Moderate ascites.  Right pleural effusion.  2. The appearance of the liver is consistent with hepatic cirrhosis. Liver is somewhat small with nodular contour and overall inhomogeneous echotexture. While  no focal liver lesions are evident on this study, it must be cautioned that the sensitivity of ultrasound for detection of focal liver lesions is diminished in this circumstance.  3. Sludge and debris within the gallbladder. The gallbladder wall appears thickened with mild pericholecystic fluid. Patient is tender over the gallbladder.  Ascites and cirrhosis can result in gallbladder wall thickening. The tenderness over the gallbladder as well as apparent pericholecystic fluid do raise concern for a degree of acute cholecystitis.  4. Mild debris noted in the biliary ductal system. No appreciable biliary duct dilatation. From an imaging standpoint, MRCP is the optimum imaging study of choice to further evaluate the biliary ductal system.    Past Medical History:  Diagnosis Date  . Alcoholic liver disease ([place])   . Chronic back pain    on disability  . Cirrhosis of liver ([place])   . Esophageal varices ([place])   . GERD (gastroesophageal reflux disease)   . Headache   . Jaundice      Past Surgical History:  Procedure Laterality Date  . BACK SURGERY     L3-L4  . BIOPSY  08/07/2020   Procedure: BIOPSY;  Surgeon: Yetta Flock, MD;  Location: [facility] ENDOSCOPY;  Service: Gastroenterology;;  . COLONOSCOPY WITH PROPOFOL N/A 08/01/2019   Procedure: COLONOSCOPY WITH PROPOFOL;  Surgeon: Yetta Flock, MD;  Location: [facility];  Service: Gastroenterology;  Laterality: N/A;  . ESOPHAGEAL BANDING  04/25/2018   Procedure: ESOPHAGEAL BANDING;  Surgeon: Yetta Flock, MD;  Location[facility] END[facility]COPY;  Service: Gastroenterology;;  . ESOPHAGEAL BANDING  04/27/2018   Procedure: ESOPHAGEAL BANDING;  Surgeon: Milus Banister, MD;  Location: [place];  Service: Endoscopy;;  . ESOPHAGEAL BANDING  06/02/2018   Procedure: ESOPHAGEAL BANDING;  Surgeon: Doran Stabler, MD;  Location: [place];  Service: Gastroenterology;;  . ESOPHAGEAL BANDING N/A 07/07/2018   Procedure: ESOPHAGEAL BANDING;  Surgeon: Yetta Flock, MD;  Location: [facility];  Service: Gastroenterology;  Laterality: N/A;  . ESOPHAGEAL BANDING  07/27/2018   Procedure: ESOPHAGEAL BANDING;  Surgeon: Yetta Flock, MD;  Location: [facility];  Service: Gastroenterology;;  . ESOPHAGEAL BANDING  08/30/2018   Procedure: ESOPHAGEAL BANDING;   Surgeon: Yetta Flock, MD;  Location: [facility];  Service: Gastroenterology;;  . ESOPHAGOGASTRODUODENOSCOPY N/A 04/27/2018   Procedure: ESOPHAGOGASTRODUODENOSCOPY (EGD);  Surgeon: Milus Banister, MD;  Location: [facility] ENDOSCOPY;  Service: Endoscopy;  Laterality: N/A;  . ESOPHAGOGASTRODUODENOSCOPY (EGD) WITH PROPOFOL N/A 04/25/2018   Procedure: ESOPHAGOGASTRODUODENOSCOPY (EGD) WITH PROPOFOL;  Surgeon: Yetta Flock, MD;  Location: [place];  Service: Gastroenterology;  Laterality: N/A;  . ESOPHAGOGASTRODUODENOSCOPY (EGD) WITH PROPOFOL N/A 06/02/2018   Procedure: ESOPHAGOGASTRODUODENOSCOPY (EGD) WITH PROPOFOL;  Surgeon: Doran Stabler, MD;  Location: [place];  Service: Gastroenterology;  Laterality: N/A;  . ESOPHAGOGASTRODUODENOSCOPY (EGD) WITH PROPOFOL N/A 07/07/2018   Procedure: ESOPHAGOGASTRODUODENOSCOPY (EGD) WITH PROPOFOL;  Surgeon: Yetta Flock, MD;  [facility];  Service: Gastroenterology;  Laterality: N/A;  . ESOPHAGOGASTRODUODENOSCOPY (EGD) WITH PROPOFOL N/A 07/27/2018   Procedure: ESOPHAGOGASTRODUODENOSCOPY (EGD) WITH PROPOFOL;  Surgeon: Yetta Flock, MD;  [facility];  Service: Gastroenterology;  Laterality: N/A;  . ESOPHAGOGASTRODUODENOSCOPY (EGD) WITH PROPOFOL N/A 08/30/2018   Procedure: ESOPHAGOGASTRODUODENOSCOPY (EGD) WITH PROPOFOL;  Surgeon: Yetta Flock, MD;  [facility];  Service: Gastroenterology;  Laterality: N/A;  . ESOPHAGOGASTRODUODENOSCOPY (EGD) WITH PROPOFOL N/A 11/08/2018   Procedure: ESOPHAGOGASTRODUODENOSCOPY (EGD) WITH PROPOFOL;  Surgeon: Yetta Flock, MD;  [facility];  Service: Gastroenterology;  Laterality: N/A;  . ESOPHAGOGASTRODUODENOSCOPY (EGD) WITH PROPOFOL N/A 01/31/2019   Procedure: ESOPHAGOGASTRODUODENOSCOPY (EGD) WITH PROPOFOL;  Surgeon: Benancio DeedsArmbruster, Odeal Welden P, MD;  Location: [facility];  Service: Gastroenterology;  Laterality: N/A;  . ESOPHAGOGASTRODUODENOSCOPY (EGD)  WITH PROPOFOL N/A 08/01/2019   Procedure: ESOPHAGOGASTRODUODENOSCOPY (EGD) WITH PROPOFOL;  Surgeon: Benancio DeedsArmbruster, Monica Zahler P, MD;  Location: [facility];  Service: Gastroenterology;  Laterality: N/A;  . ESOPHAGOGASTRODUODENOSCOPY (EGD) WITH PROPOFOL N/A 08/07/2020   Procedure: ESOPHAGOGASTRODUODENOSCOPY (EGD) WITH PROPOFOL;  Surgeon: Benancio DeedsArmbruster, Isiac Breighner P, MD;  Location: [facility];  Service: Gastroenterology;  Laterality: N/A;  . IR PARACENTESIS  04/26/2018  . IR PARACENTESIS  05/18/2018  . IR PARACENTESIS  05/31/2018  . IR PARACENTESIS  06/30/2018  . IR PARACENTESIS  08/05/2018  . IR PARACENTESIS  08/31/2018  . IR PARACENTESIS  10/08/2018  . IR PARACENTESIS  10/28/2018  . IR PARACENTESIS  02/14/2019  . IR PARACENTESIS  03/09/2019  . IR PARACENTESIS  04/05/2019  . IR PARACENTESIS  05/03/2019  . IR PARACENTESIS  06/20/2019  . IR PARACENTESIS  07/19/2019  . IR PARACENTESIS  08/19/2019  . IR PARACENTESIS  09/27/2019  . IR PARACENTESIS  10/07/2019  . IR PARACENTESIS  10/28/2019  . IR PARACENTESIS  11/16/2019  . IR PARACENTESIS  12/30/2019  . IR PARACENTESIS  01/16/2020  . IR PARACENTESIS  02/07/2020  . IR PARACENTESIS  03/06/2020  . IR PARACENTESIS  04/04/2020  . IR PARACENTESIS  04/17/2020  . IR PARACENTESIS  05/04/2020  . IR PARACENTESIS  06/07/2020  . IR PARACENTESIS  06/28/2020  . IR PARACENTESIS  07/27/2020  . IR PARACENTESIS  08/20/2020  . IR PARACENTESIS  09/13/2020  . IR PARACENTESIS  10/04/2020  . IR PARACENTESIS  10/25/2020  . IR PARACENTESIS  11/07/2020  . IR RADIOLOGIST EVAL & MGMT  12/23/2018  . IR THORACENTESIS ASP PLEURAL SPACE W/IMG GUIDE  12/10/2018  . IR THORACENTESIS ASP PLEURAL SPACE W/IMG GUIDE  01/21/2019   Family History  Problem Relation Age of Onset  . Colon cancer Neg Hx   . Esophageal cancer Neg Hx   . Pancreatic cancer Neg Hx   . Stomach cancer Neg Hx    Social History   Tobacco Use  . Smoking status: Current Some Day Smoker    Packs/day: 1.00    Years: 20.00    Pack years: 20.00     Types: Cigarettes  . Smokeless tobacco: Never Used  . Tobacco comment: 5 a day  Vaping Use  . Vaping Use: Never used  Substance Use Topics  . Alcohol use: Not Currently    Comment: drinks a beer every few days   . Drug use: Not Currently   Current Outpatient Medications  Medication Sig Dispense Refill  . ferrous sulfate 325 (65 FE) MG tablet Take 1 tablet (325 mg total) by mouth daily with breakfast. (Patient not taking: Reported on 11/08/2020) 30 tablet 3  . furosemide (LASIX) 20 MG tablet Take 2 tablets (40 mg total) by mouth 2 (two) times daily. (Patient not taking: Reported on 11/08/2020) 120 tablet 0  . Multiple Vitamin (MULTIVITAMIN WITH MINERALS) TABS tablet Take 1 tablet by mouth daily. (Patient not taking: Reported on 11/08/2020)    . omeprazole (PRILOSEC) 20 MG capsule Take 1 capsule (20 mg total) by mouth daily. (Patient not taking: Reported on 11/08/2020) 30 capsule 1  . potassium chloride SA (KLOR-CON) 20 MEQ tablet Take 1 tablet (20 mEq total) by mouth 2 (two) times daily. (Patient not taking: Reported on 11/08/2020) 10 tablet  0  . spironolactone (ALDACTONE) 100 MG tablet Take 2 tablets (200 mg total) by mouth daily. (Patient not taking: Reported on 11/08/2020) 60 tablet 0   No current facility-administered medications for this visit.   Facility-Administered Medications Ordered in Other Visits  Medication Dose Route Frequency Provider Last Rate Last Admin  . lactated ringers infusion    Continuous PRN Leonor Liv, CRNA   New Bag at 03/09/19 0800  . lactated ringers infusion    Continuous PRN Leonor Liv, CRNA   New Bag at 03/09/19 [id number]   Allergies  Allergen Reactions  . Contrast Media [Iodinated Diagnostic Agents] Itching and Swelling    Needs 13-hr prep  . Latex Itching, Swelling and Other (See Comments)    No breathing impairment, however  . Sulfa Antibiotics Itching, Swelling and Other (See Comments)    No breathing impairment, however     Review of  Systems: All systems reviewed and negative except where noted in HPI.    IR Paracentesis  Result Date: 11/08/2020 INDICATION: Patient with history of alcoholic cirrhosis, recurrent ascites; request received for therapeutic paracentesis up to 10 liters. EXAM: ULTRASOUND GUIDED THERAPEUTIC  PARACENTESIS MEDICATIONS: 1% lidocaine to skin/SQ tissue COMPLICATIONS: None immediate. PROCEDURE: Informed written consent was obtained from the patient after a discussion of the risks, benefits and alternatives to treatment. A timeout was performed prior to the initiation of the procedure. Initial ultrasound scanning demonstrates a large amount of ascites within the right lower abdominal quadrant. The right lower abdomen was prepped and draped in the usual sterile fashion. 1% lidocaine was used for local anesthesia. Following this, a 19 gauge, 7-cm, Yueh catheter was introduced. An ultrasound image was saved for documentation purposes. The paracentesis was performed. The catheter was removed and a dressing was applied. The patient tolerated the procedure well without immediate post procedural complication. Patient received post-procedure intravenous albumin; see nursing notes for details. FINDINGS: A total of approximately 7.5 liters of yellow fluid was removed. IMPRESSION: Successful ultrasound-guided therapeutic paracentesis yielding 7.5 liters of peritoneal fluid. Read by: Rowe Robert, PA-C Electronically Signed   By: Ruthann Cancer MD   On: 11/08/2020 09:59   IR Paracentesis  Result Date: 10/25/2020 INDICATION: Patient history of alcoholic cirrhosis, abdominal distension, and recurrent ascites. Request is made for diagnostic and therapeutic paracentesis to 10 L. EXAM: ULTRASOUND GUIDED DIAGNOSTIC AND THERAPEUTIC PARACENTESIS MEDICATIONS: 10 mL% lidocaine COMPLICATIONS: None immediate. PROCEDURE: Informed written consent was obtained from the patient after a discussion of the risks, benefits and alternatives to  treatment. A timeout was performed prior to the initiation of the procedure. Initial ultrasound scanning demonstrates a large amount of ascites within the right lower abdominal quadrant. The right lower abdomen was prepped and draped in the usual sterile fashion. 1% lidocaine was used for local anesthesia. Following this, a 19 gauge, 7-cm, Yueh catheter was introduced. An ultrasound image was saved for documentation purposes. The paracentesis was performed. The catheter was removed and a dressing was applied. The patient tolerated the procedure well without immediate post procedural complication. Patient received post-procedure intravenous albumin; see nursing notes for details. FINDINGS: A total of approximately 7.8 L of clear yellow fluid was removed. Samples were sent to the laboratory as requested by the clinical team. IMPRESSION: Successful ultrasound-guided paracentesis yielding 7.8 L of peritoneal fluid. Read by: Earley Abide, PA-C Electronically Signed   By: Ruthann Cancer MD   On: 10/25/2020 14:05    Lab Results  Component Value Date   WBC 3.5 (  L) 08/30/2020   HGB 10.0 (L) 08/30/2020   HCT 31.7 (L) 08/30/2020   MCV 75.3 (L) 08/30/2020   PLT 157.0 Repeated and verified X2. 08/30/2020    Lab Results  Component Value Date   CREATININE 0.60 08/07/2020   BUN 13 08/07/2020   NA 139 08/07/2020   K 3.7 08/07/2020   CL 107 08/07/2020   CO2 28 07/19/2020    Lab Results  Component Value Date   ALT 14 07/19/2020   AST 39 (H) 07/19/2020   ALKPHOS 160 (H) 07/19/2020   BILITOT 1.3 (H) 07/19/2020      Physical Exam: BP 100/62   Pulse 93   Ht 5\' 4"  (1.626 m)   Wt 123 lb (55.8 kg)   SpO2 97%   BMI 21.11 kg/m  Constitutional: Pleasant,well-developed, female in no acute distress. HEENT: Normocephalic and atraumatic. Conjunctivae are normal. No scleral icterus. Neck supple.  Cardiovascular: Normal rate, regular rhythm.  Abdominal: Soft, nondistended, nontender. (+) ascites.  There  are no masses palpable.  Extremities: no edema Lymphadenopathy: No cervical adenopathy noted. Neurological: Alert and oriented to person place and time. Skin: Skin is warm and dry. No rashes noted. Psychiatric: Normal mood and affect. Behavior is normal.   ASSESSMENT AND PLAN: 55 year old female here for reassessment of the following:  Alcoholic cirrhosis with ascites History of esophageal varices with bleeding Edema  Patient here for follow-up visit.  She has been good with alcohol abstinence for the past 6 to 8 months and this is very encouraging.  She has had continued problems with ascites requiring large-volume paracentesis, historically has not been able to be compliant with medications or labs.  She is getting weary of continued paracentesis and wanted to discuss other options.  I offered her resumption of diuretics if she is willing to be compliant with them and follow-up with lab draws as we ask her to do.  I told her we would need a blood draw 1 week after starting diuretics and then perhaps every 2 weeks for the next several weeks as we titrate up or down.  She seems motivated to take care of this issue another trial of the diuretics.  We will try her on Aldactone 100 and Lasix 20 mg twice daily.  She will come back next week for labs to make sure stable.  She inquires about TIPS.  She does have a history of encephalopathy in the past although that is not currently an issue.  I would like her to have a trial of diuretic compliance first prior to going to TIPS, given potential risks of that.  She is agreeable and understands.  She must continue to abstain from alcohol.  She is due for [facility] screening with an ultrasound in June.  She will continue low-salt diet.  I will see her every 6 months, anticipate labs next week and will make recommendations on her diuretic dosing based on that result.  Plan: - continued alcohol abstinence - continued low Na diet - start aldactone 100mg  / day -  start lasix 20mg  BID - labs next week - CMET, CBC, INR, AFP - RUQ [place] in June for [facility] screening - surveillance EGD 07/2021 - follow up in 6 months or sooner with issues. She will call for therapeutic paracentesis PRN if she needs this while escalating diuretics over the upcoming weeks  [place] Cellar, MD [facility]

## 2020-11-08 NOTE — Progress Notes (Signed)
Per Dr. Havery Moros patient is not taking any medication.  Started him back on lasix and aldactone today

## 2020-11-08 NOTE — Procedures (Signed)
Ultrasound-guided  therapeutic paracentesis performed yielding 7.5 liters of yellow fluid. No immediate complications. EBL none. The pt received IV albumin post procedure.

## 2020-11-08 NOTE — Patient Instructions (Addendum)
If you are age 56 or older, your body mass index should be between 23-30. Your Body mass index is 21.11 kg/m. If this is out of the aforementioned range listed, please consider follow up with your Primary Care Provider.  If you are age 86 or younger, your body mass index should be between 19-25. Your Body mass index is 21.11 kg/m. If this is out of the aformentioned range listed, please consider follow up with your Primary Care Provider.   We have sent the following medications to your pharmacy for you to pick up at your convenience: Aldactone 100 mg:Take once daily Lasix 20 mg: Take twice daily  Please go to the lab in the basement of our building next Friday for labs.  You will be contacted by [place] in the next 2 days to arrange a RUQ ultrasound of the liver.  The number on your caller ID will be [phone], please answer when they call.  If you have not heard from them in 2 days please call [phone] to schedule.    Thank you for entrusting me with your care and for choosing [facility], Dr. Avalon Cellar

## 2020-11-26 ENCOUNTER — Telehealth: Payer: Self-pay | Admitting: Gastroenterology

## 2020-11-26 DIAGNOSIS — K7031 Alcoholic cirrhosis of liver with ascites: Secondary | ICD-10-CM

## 2020-11-26 NOTE — Telephone Encounter (Signed)
OKay thank you will await her labs

## 2020-11-26 NOTE — Telephone Encounter (Signed)
Thanks [employer]. Can you clarify if she is taking lasix / aldactone per last clinic visit? If so, she must go to the lab for BMET as previously recommended to make sure renal function stable. You can order her a paracentesis but I need to know if she is taking her diuretics and if present dose is helping. Thanks

## 2020-11-26 NOTE — Telephone Encounter (Signed)
Ok to schedule paracentesis? Last paracentesis was 11/07/20, 7.5 L were removed. Patient has not come in for repeat labs based on 5/19 office note. Please advise, thanks.

## 2020-11-26 NOTE — Telephone Encounter (Signed)
Inbound call from pt stating she wanted to schedule for her paracentesis. Please give her a call. Thank you

## 2020-11-26 NOTE — Telephone Encounter (Signed)
Dr. Havery Moros, just an FYI. Thanks  Patient has been scheduled for a paracentesis at [facility] on Friday, 11/30/20 at 1 PM, arrive at 12:45 PM. Spoke with patient and she confirms that she is taking Lasix 20 mg BID and Aldactone 100 mg daily. Patient reports that the swelling seems about the same, she reports that the swelling will go down and then flare back up. Advised patient that she will need to come in for lab work as soon as she can this week. She is aware of her paracentesis appt. Patient verbalized understanding and had no concerns at the end of the call.

## 2020-11-30 ENCOUNTER — Other Ambulatory Visit: Payer: Self-pay

## 2020-11-30 ENCOUNTER — Ambulatory Visit (HOSPITAL_COMMUNITY)
Admission: RE | Admit: 2020-11-30 | Discharge: 2020-11-30 | Disposition: A | Discharge status: Home / Self Care | Payer: Medicare HMO | Source: Ambulatory Visit | Attending: Gastroenterology | Admitting: Gastroenterology

## 2020-11-30 DIAGNOSIS — R188 Other ascites: Secondary | ICD-10-CM | POA: Insufficient documentation

## 2020-11-30 DIAGNOSIS — K7031 Alcoholic cirrhosis of liver with ascites: Secondary | ICD-10-CM

## 2020-11-30 DIAGNOSIS — R69 Illness, unspecified: Secondary | ICD-10-CM | POA: Diagnosis not present

## 2020-11-30 HISTORY — PX: IR PARACENTESIS: IMG2679

## 2020-11-30 MED ORDER — LIDOCAINE HCL 1 % IJ SOLN
INTRAMUSCULAR | Status: DC | PRN
  Administered 2020-11-30: 10 mL

## 2020-11-30 MED ORDER — ALBUMIN HUMAN 25 % IV SOLN
INTRAVENOUS | Status: AC
  Administered 2020-11-30: 50 g via INTRAVENOUS
  Filled 2020-11-30: qty 200

## 2020-11-30 MED ORDER — ALBUMIN HUMAN 25 % IV SOLN: 50.0000 g | Freq: Once | INTRAVENOUS | Status: AC

## 2020-11-30 MED ORDER — LIDOCAINE HCL (PF) 1 % IJ SOLN
INTRAMUSCULAR | Status: AC
  Filled 2020-11-30: qty 30

## 2020-11-30 NOTE — Procedures (Signed)
Ultrasound-guided therapeutic paracentesis performed yielding 7.6 liters of straw  colored fluid. No immediate complications. EBL is none.

## 2020-12-07 ENCOUNTER — Other Ambulatory Visit (INDEPENDENT_AMBULATORY_CARE_PROVIDER_SITE_OTHER): Payer: Medicare HMO

## 2020-12-07 ENCOUNTER — Other Ambulatory Visit: Payer: Self-pay

## 2020-12-07 DIAGNOSIS — R609 Edema, unspecified: Secondary | ICD-10-CM | POA: Diagnosis not present

## 2020-12-07 DIAGNOSIS — D509 Iron deficiency anemia, unspecified: Secondary | ICD-10-CM

## 2020-12-07 DIAGNOSIS — R69 Illness, unspecified: Secondary | ICD-10-CM | POA: Diagnosis not present

## 2020-12-07 DIAGNOSIS — I8501 Esophageal varices with bleeding: Secondary | ICD-10-CM

## 2020-12-07 DIAGNOSIS — K7031 Alcoholic cirrhosis of liver with ascites: Secondary | ICD-10-CM | POA: Diagnosis not present

## 2020-12-07 LAB — CBC WITH DIFFERENTIAL/PLATELET
Basophils Absolute: 0.1 10*3/uL (ref 0.0–0.1)
Basophils Relative: 2 % (ref 0.0–3.0)
Eosinophils Absolute: 0.2 10*3/uL (ref 0.0–0.7)
Eosinophils Relative: 5.6 % — ABNORMAL HIGH (ref 0.0–5.0)
HCT: 30 % — ABNORMAL LOW (ref 36.0–46.0)
Hemoglobin: 9.8 g/dL — ABNORMAL LOW (ref 12.0–15.0)
Lymphocytes Relative: 32.3 % (ref 12.0–46.0)
Lymphs Abs: 1.1 10*3/uL (ref 0.7–4.0)
MCHC: 32.7 g/dL (ref 30.0–36.0)
MCV: 72.8 fl — ABNORMAL LOW (ref 78.0–100.0)
Monocytes Absolute: 0.4 10*3/uL (ref 0.1–1.0)
Monocytes Relative: 11.8 % (ref 3.0–12.0)
Neutro Abs: 1.6 10*3/uL (ref 1.4–7.7)
Neutrophils Relative %: 48.3 % (ref 43.0–77.0)
Platelets: 173 10*3/uL (ref 150.0–400.0)
RBC: 4.13 Mil/uL (ref 3.87–5.11)
RDW: 25.4 % — ABNORMAL HIGH (ref 11.5–15.5)
WBC: 3.4 10*3/uL — ABNORMAL LOW (ref 4.0–10.5)

## 2020-12-07 LAB — PROTIME-INR
INR: 1.2 ratio — ABNORMAL HIGH (ref 0.8–1.0)
Prothrombin Time: 13.1 s (ref 9.6–13.1)

## 2020-12-07 LAB — COMPREHENSIVE METABOLIC PANEL
ALT: 14 U/L (ref 0–35)
AST: 27 U/L (ref 0–37)
Albumin: 3.4 g/dL — ABNORMAL LOW (ref 3.5–5.2)
Alkaline Phosphatase: 104 U/L (ref 39–117)
BUN: 13 mg/dL (ref 6–23)
CO2: 25 mEq/L (ref 19–32)
Calcium: 9.2 mg/dL (ref 8.4–10.5)
Chloride: 104 mEq/L (ref 96–112)
Creatinine, Ser: 0.63 mg/dL (ref 0.40–1.20)
GFR: 99.51 mL/min (ref >60.00)
Glucose, Bld: 109 mg/dL — ABNORMAL HIGH (ref 70–99)
Potassium: 3.8 mEq/L (ref 3.5–5.1)
Sodium: 135 mEq/L (ref 135–145)
Total Bilirubin: 1.1 mg/dL (ref 0.2–1.2)
Total Protein: 6.8 g/dL (ref 6.0–8.3)

## 2020-12-08 IMAGING — CT CT ANGIOGRAPHY ABDOMEN
4 of 10 series · 12 of 46 positions shown, 16 images · IV contrast (OMNIPAQUE)
Comparison: CT 04/25/2018

CLINICAL DATA: 53-year-old with portal hypertension. Pre TIPS
evaluation.

EXAM:
CT ANGIOGRAPHY ABDOMEN
TECHNIQUE: Multidetector CT imaging of the abdomen was performed using the
standard protocol during bolus administration of intravenous
contrast. Multiplanar reconstructed images and MIPs were obtained
and reviewed to evaluate the vascular anatomy.
CONTRAST:  100mL OMNIPAQUE IOHEXOL 350 MG/ML SOLN

[Series 2: brto pre · axial · non-contrast · 0.81mm/px · z∈[-192,-92]mm · 2 of 60 slices shown]
[im 20/60  soft-tissue]
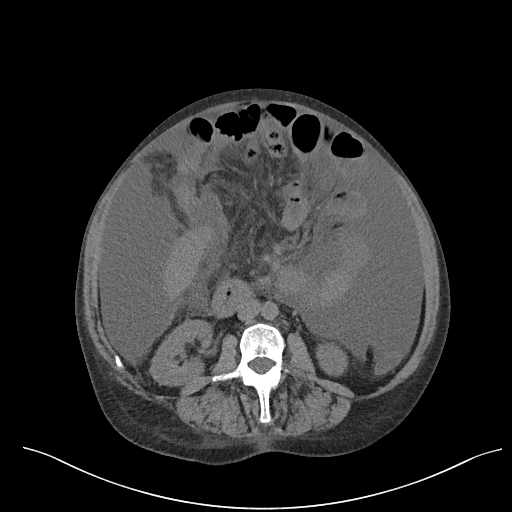
[im 40/60  soft-tissue]
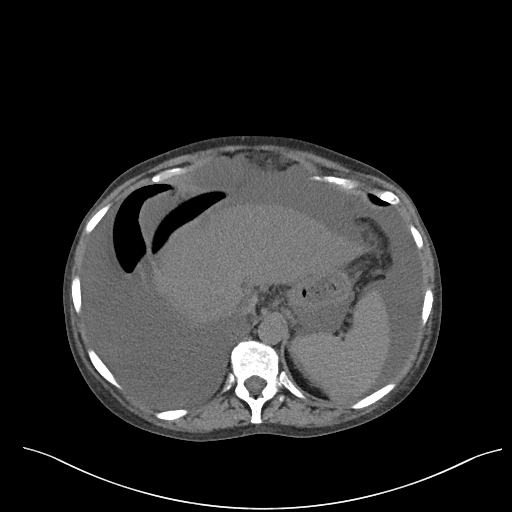

[Series 4: brto arterial · axial · arterial · 0.81mm/px · z∈[-241,-43]mm · 5 of 100 slices shown]
[im 17/100  soft-tissue]
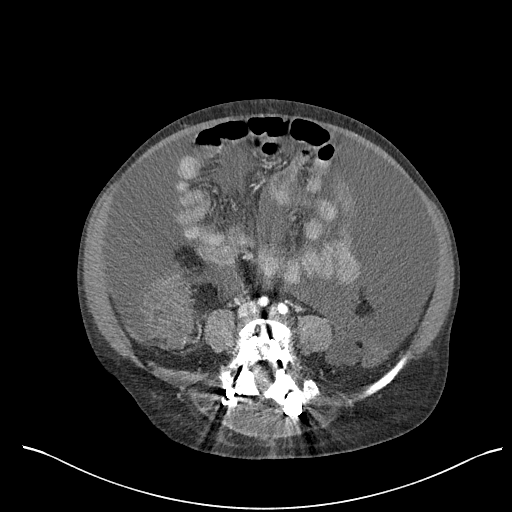
[im 34/100  soft-tissue]
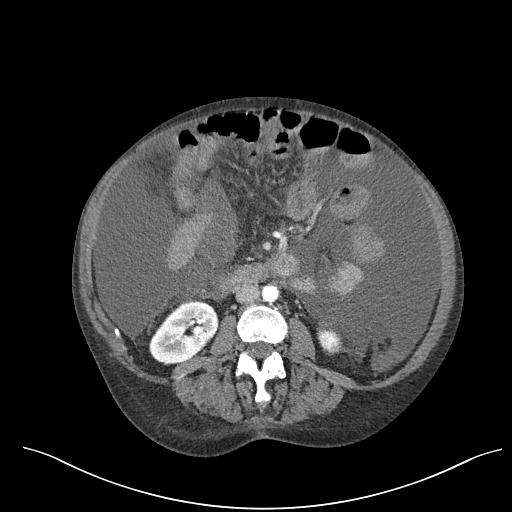
[im 50/100  soft-tissue]
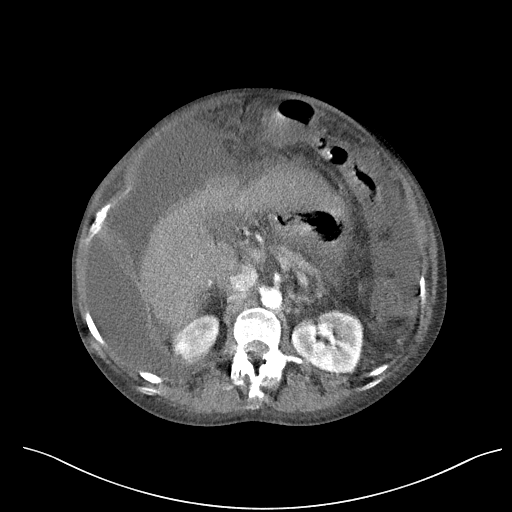
[im 67/100  soft-tissue]
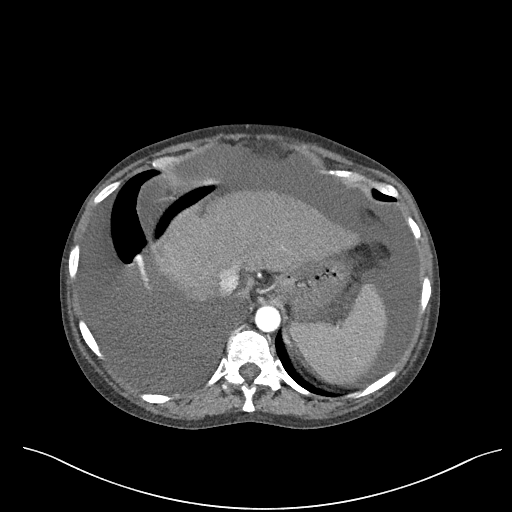
[im 83/100  soft-tissue]
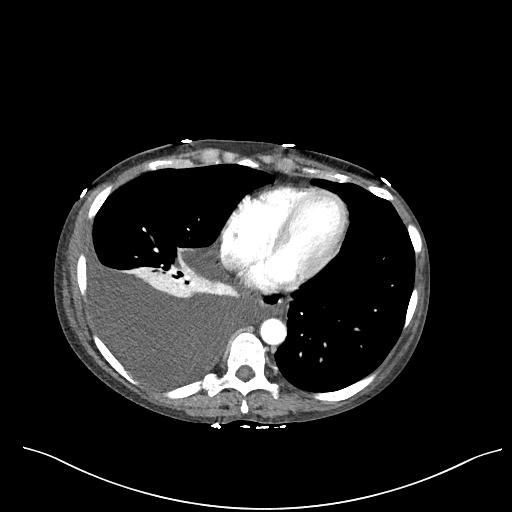

[Series 6: coronal arterial · coronal · arterial · 0.67mm/px · 3 of 95 slices shown, 4 images]
[im 24/95  soft-tissue]
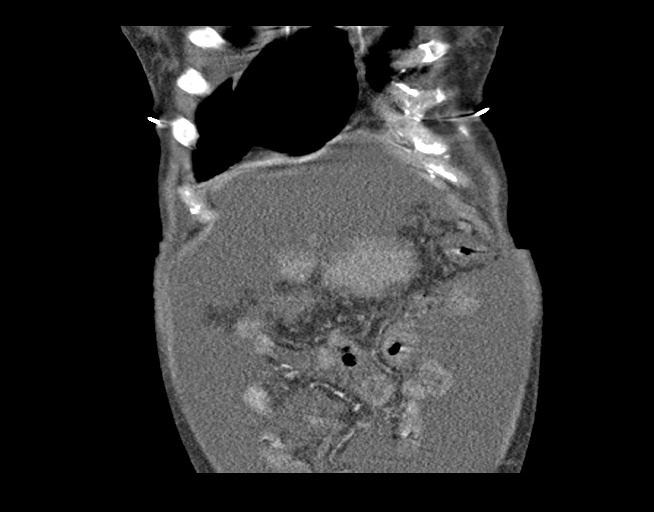
[im 48/95  soft-tissue]
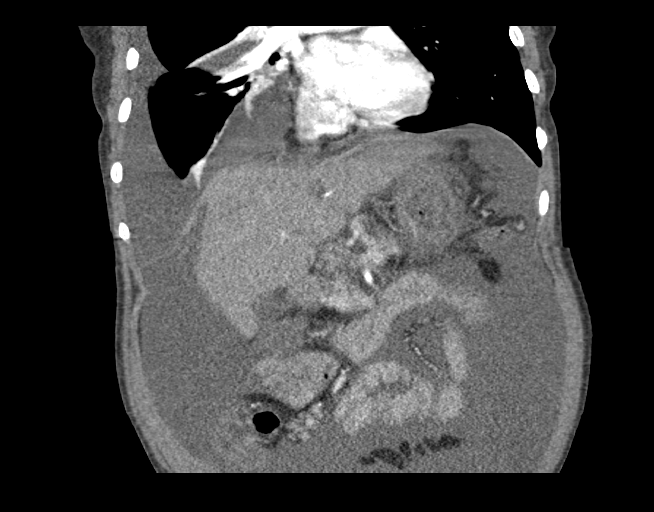
[im 48/95  bone]
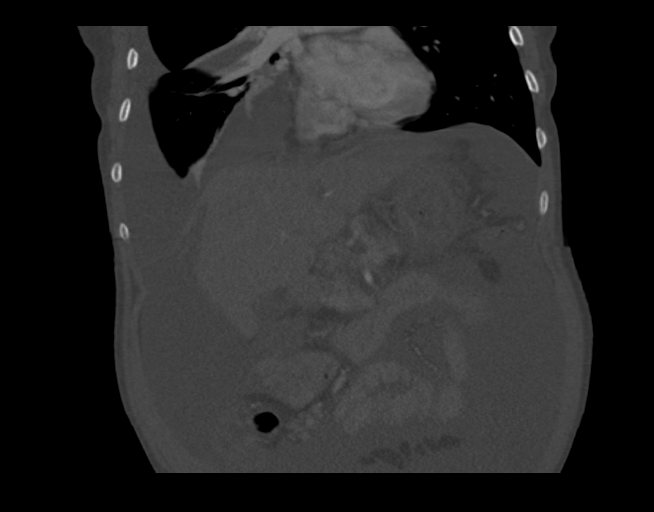
[im 71/95  soft-tissue]
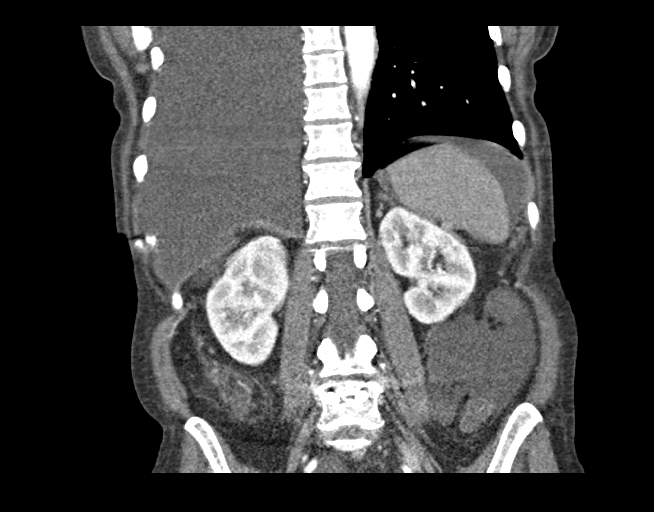

[Series 9: brto portal venous · axial · portal-venous · 0.81mm/px · z∈[-192,-92]mm · 2 of 60 slices shown, 5 images]
[im 20/60  soft-tissue]
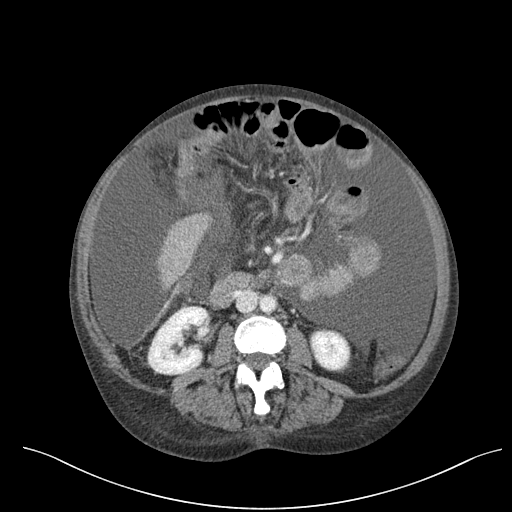
[im 20/60  lung]
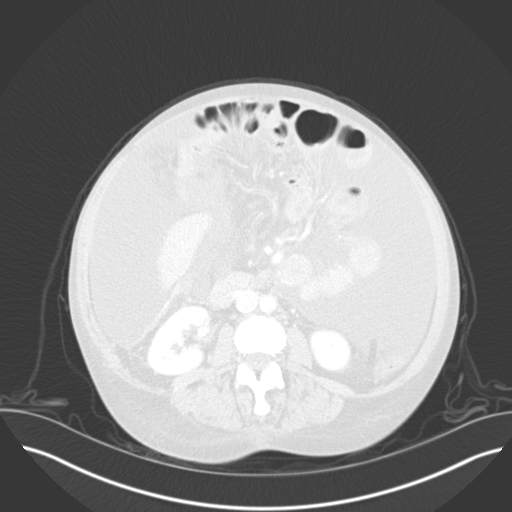
[im 20/60  bone]
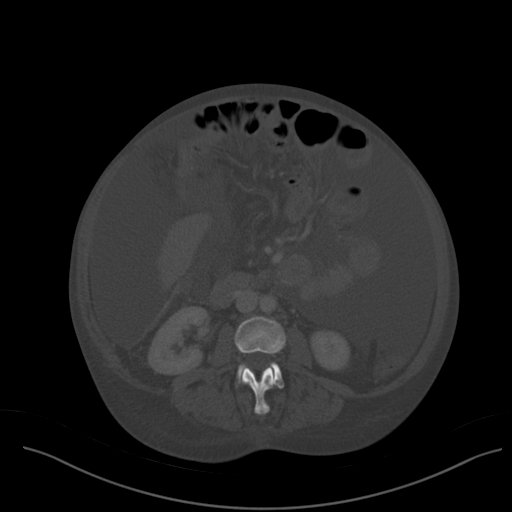
[im 40/60  soft-tissue]
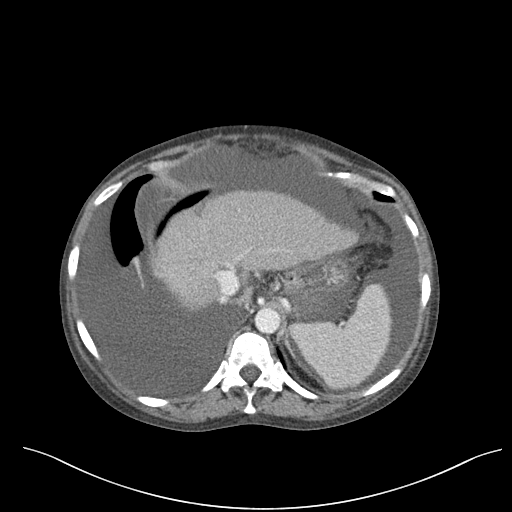
[im 40/60  lung]
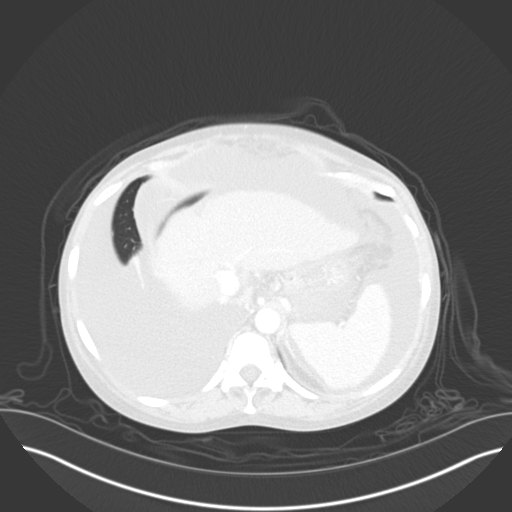

[12 of 46 positions shown; findings below may reference images not displayed]

FINDINGS: VASCULAR

Aorta: Normal caliber of the abdominal aorta and distal descending
thoracic aorta. No significant atherosclerotic disease. Negative for
dissection.

Celiac: Celiac trunk is patent. There is a replaced left hepatic
artery coming off the left gastric artery. Origin of the celiac
trunk is widely patent.

SMA: Limited evaluation of the origin due to motion artifact. SMA is
patent.

Renals: Bilateral renal arteries are patent without evidence of
stenosis.

IMA: IMA is patent.

Inflow: Common iliac arteries are patent. The proximal internal and
external iliac arteries are patent bilaterally.

Veins: Main portal vein is patent but small. Portal vein measures 7
mm in diameter. Left and right portal veins are patent. SMV is
patent. Splenic vein is patent. There are large esophageal varices
which appear to be associated with a large left gastric vein. No
significant gastro renal shunt. No significant gastric varices.
Prominent venous structures along the anterior left upper abdomen.
The left and middle hepatic veins may have a shared origin. Separate
origin for the right hepatic vein. IVC is patent. Proximal iliac
veins are patent. There is a prominent right gonadal vein.

Review of the MIP images confirms the above findings.

NON-VASCULAR

Lower chest: Large right pleural effusion with extensive compressive
atelectasis in the right lung base.

Hepatobiliary: Liver is small and nodular. Findings compatible with
cirrhosis. No suspicious liver lesion. Fluid around the gallbladder.
Difficult to exclude gallbladder wall thickening.

Pancreas: Unremarkable. No pancreatic ductal dilatation or
surrounding inflammatory changes.

Spleen: No significant splenic enlargement. No focal splenic
abnormality.

Adrenals/Urinary Tract: Adrenal tissue is unremarkable. Right
adrenal gland is not well visualized. Normal appearance of the
kidneys. Punctate low-density structure in the mid right kidney
could represent a cyst but too small to definitively characterize.

Stomach/Bowel: Varices around the distal esophagus. Mild wall
thickening in the distal stomach. No evidence for bowel obstruction.
Limited evaluation of the right colon due to surrounding fluid and
under distension.

Lymphatic: No significant lymph node enlargement in the abdomen.

Other: Large amount of abdominal ascites.

Musculoskeletal: Pedicle screw and rod fixation at L4-L5. No acute
bone abnormality.
IMPRESSION: VASCULAR

1. Large esophageal varices associated the left gastric vein.
2. Portal venous system is patent. Main portal vein slightly small
measuring 7 mm in diameter. Hepatic veins are patent.
3. No significant gastro renal shunt.
4. Arterial structures are patent without any significant
atherosclerotic disease.

NON-VASCULAR

1. Cirrhosis with large amount of ascites. Large right pleural
effusion.
2. Compressive atelectasis in the right lung secondary to the large
right pleural effusion.

## 2020-12-10 ENCOUNTER — Telehealth: Payer: Self-pay | Admitting: Gastroenterology

## 2020-12-10 DIAGNOSIS — K7031 Alcoholic cirrhosis of liver with ascites: Secondary | ICD-10-CM

## 2020-12-10 LAB — AFP TUMOR MARKER: AFP-Tumor Marker: 1.8 ng/mL

## 2020-12-10 NOTE — Telephone Encounter (Signed)
Dr. Havery Moros, ok to order? Last paracentesis was on 11/30/20, 7.6 L removed. Patient currently on Aldactone 100 mg daily. Please advise, thanks.

## 2020-12-10 NOTE — Telephone Encounter (Signed)
Okay to do another paracentesis if worsening. Otherwise can increase aldactone to 200mg  / day and add lasix 20mg  / day, repeat BMET in one week. If she is not going to be compliant with the blood draw however would keep her at same dose of her diuretic. Thanks

## 2020-12-10 NOTE — Telephone Encounter (Signed)
Patient has been scheduled for a paracentesis at [facility] on Thursday, 12/13/20 at 1 PM. Scheduler also states that they have tried to reach patient to schedule her routine [place]. Patient has been schedule for her RUQ [place] at [facility] on Thursday, 12/27/20 at 11 am, NPO after midnight.  Spoke with patient in regards to appointments and instructions. Patient states that she would like to remain on current dose of diuretic because she is in [place]. She will continue Aldactone 100 mg daily. Patient verbalized understanding of all information and had no concerns at the end of the call.  Letter mailed to patient with [place] appt information.

## 2020-12-10 NOTE — Telephone Encounter (Signed)
Inbound call from the pt stating that she needs to sch her paracentesis. Please advise. Thanks

## 2020-12-13 ENCOUNTER — Ambulatory Visit (HOSPITAL_COMMUNITY)
Admission: RE | Admit: 2020-12-13 | Discharge: 2020-12-13 | Disposition: A | Discharge status: Home / Self Care | Payer: Medicare HMO | Source: Ambulatory Visit | Attending: Gastroenterology | Admitting: Gastroenterology

## 2020-12-13 ENCOUNTER — Other Ambulatory Visit: Payer: Self-pay

## 2020-12-13 DIAGNOSIS — K7031 Alcoholic cirrhosis of liver with ascites: Secondary | ICD-10-CM | POA: Insufficient documentation

## 2020-12-13 DIAGNOSIS — R69 Illness, unspecified: Secondary | ICD-10-CM | POA: Diagnosis not present

## 2020-12-13 HISTORY — PX: IR PARACENTESIS: IMG2679

## 2020-12-13 MED ORDER — LIDOCAINE HCL 1 % IJ SOLN
INTRAMUSCULAR | Status: AC
  Filled 2020-12-13: qty 20

## 2020-12-13 MED ORDER — LIDOCAINE HCL 1 % IJ SOLN
INTRAMUSCULAR | Status: DC | PRN
  Administered 2020-12-13: 10 mL

## 2020-12-13 NOTE — Procedures (Signed)
PROCEDURE SUMMARY:  Successful image-guided paracentesis from the the left lower abdomen.  Yielded 6.8 liters of clear yellow fluid.  No immediate complications.  EBL < 1 mL Patient tolerated well.   Specimen was not sent for labs.  Please see imaging section of Epic for full dictation.  Joaquim Nam PA-C 12/13/2020 1:39 PM

## 2020-12-17 DIAGNOSIS — R69 Illness, unspecified: Secondary | ICD-10-CM | POA: Diagnosis not present

## 2020-12-17 DIAGNOSIS — K7460 Unspecified cirrhosis of liver: Secondary | ICD-10-CM | POA: Diagnosis not present

## 2020-12-17 DIAGNOSIS — Z882 Allergy status to sulfonamides status: Secondary | ICD-10-CM | POA: Diagnosis not present

## 2020-12-17 DIAGNOSIS — Z91041 Radiographic dye allergy status: Secondary | ICD-10-CM | POA: Diagnosis not present

## 2020-12-17 DIAGNOSIS — E261 Secondary hyperaldosteronism: Secondary | ICD-10-CM | POA: Diagnosis not present

## 2020-12-17 DIAGNOSIS — Z9181 History of falling: Secondary | ICD-10-CM | POA: Diagnosis not present

## 2020-12-17 DIAGNOSIS — I739 Peripheral vascular disease, unspecified: Secondary | ICD-10-CM | POA: Diagnosis not present

## 2020-12-17 DIAGNOSIS — Z008 Encounter for other general examination: Secondary | ICD-10-CM | POA: Diagnosis not present

## 2020-12-17 DIAGNOSIS — K5900 Constipation, unspecified: Secondary | ICD-10-CM | POA: Diagnosis not present

## 2020-12-17 DIAGNOSIS — R32 Unspecified urinary incontinence: Secondary | ICD-10-CM | POA: Diagnosis not present

## 2020-12-17 DIAGNOSIS — K219 Gastro-esophageal reflux disease without esophagitis: Secondary | ICD-10-CM | POA: Diagnosis not present

## 2020-12-18 ENCOUNTER — Other Ambulatory Visit: Payer: Self-pay

## 2020-12-18 ENCOUNTER — Ambulatory Visit (INDEPENDENT_AMBULATORY_CARE_PROVIDER_SITE_OTHER): Payer: Medicare HMO

## 2020-12-18 DIAGNOSIS — Z0000 Encounter for general adult medical examination without abnormal findings: Secondary | ICD-10-CM | POA: Diagnosis not present

## 2020-12-18 DIAGNOSIS — Z1231 Encounter for screening mammogram for malignant neoplasm of breast: Secondary | ICD-10-CM | POA: Diagnosis not present

## 2020-12-18 NOTE — Progress Notes (Incomplete)
Virtual Visit via Telephone Note  I connected with  Katie Holland on 12/18/20 at 11:00 AM EDT by telephone and verified that I am speaking with the correct person using two identifiers.  Location: Patient: Home Provider: office Persons participating in the virtual visit: patient/Nurse Health Advisor   I discussed the limitations, risks, security and privacy concerns of performing an evaluation and management service by telephone and the availability of in person appointments. The patient expressed understanding and agreed to proceed.  Interactive audio and video telecommunications were attempted between this nurse and patient, however failed, due to patient having technical difficulties OR patient did not have access to video capability.  We continued and completed visit with audio only.  Some vital signs may be absent or patient reported.   Katie Brace, LPN   Subjective:   Katie Holland is a 55 y.o. female who presents for an Initial Medicare Annual Wellness Visit.  Review of Systems           Objective:    There were no vitals filed for this visit. There is no height or weight on file to calculate BMI.  Advanced Directives 12/07/2019 08/18/2019 08/01/2019 03/09/2019 02/14/2019 01/31/2019 01/20/2019  Does Patient Have a Medical Advance Directive? No No No Yes No No No  Type of Advance Directive - - - Living will - - -  Does patient want to make changes to medical advance directive? - - - No - Patient declined - - -  Would patient like information on creating a medical advance directive? - No - Patient declined No - Patient declined - No - Patient declined No - Patient declined No - Patient declined    Current Medications (verified) Outpatient Encounter Medications as of 12/18/2020  Medication Sig   furosemide (LASIX) 20 MG tablet Take 1 tablet (20 mg total) by mouth 2 (two) times daily.   spironolactone (ALDACTONE) 100 MG tablet Take 1 tablet (100 mg total) by mouth daily.    Facility-Administered Encounter Medications as of 12/18/2020  Medication   lactated ringers infusion   lactated ringers infusion    Allergies (verified) Contrast media [iodinated diagnostic agents], Latex, and Sulfa antibiotics   History: Past Medical History:  Diagnosis Date   Alcoholic liver disease (HCC)    Chronic back pain    on disability   Cirrhosis of liver (HCC)    Esophageal varices (HCC)    GERD (gastroesophageal reflux disease)    Headache    Jaundice    Past Surgical History:  Procedure Laterality Date   BACK SURGERY     L3-L4   BIOPSY  08/07/2020   Procedure: BIOPSY;  Surgeon: Yetta Flock, MD;  Location: [facility];  Service: Gastroenterology;;   COLONOSCOPY WITH PROPOFOL N/A 08/01/2019   Procedure: COLONOSCOPY WITH PROPOFOL;  Surgeon: Yetta Flock, MD;  Location: [facility];  Service: Gastroenterology;  Laterality: N/A;   ESOPHAGEAL BANDING  04/25/2018   Procedure: ESOPHAGEAL BANDING;  Surgeon: Yetta Flock, MD;  Location: [facility];  Service: Gastroenterology;;   ESOPHAGEAL BANDING  04/27/2018   Procedure: ESOPHAGEAL BANDING;  Surgeon: Milus Banister, MD;  Location: [place];  Service: Endoscopy;;   ESOPHAGEAL BANDING  06/02/2018   Procedure: ESOPHAGEAL BANDING;  Surgeon: Doran Stabler, MD;  Location: [place];  Service: Gastroenterology;;   ESOPHAGEAL BANDING N/A 07/07/2018   Procedure: ESOPHAGEAL BANDING;  Surgeon: Yetta Flock, MD;  Location: [facility];  Service: Gastroenterology;  Laterality: N/A;   ESOPHAGEAL BANDING  07/27/2018   Procedure: ESOPHAGEAL BANDING;  Surgeon: Yetta Flock, MD;  Location: [facility];  Service: Gastroenterology;;   ESOPHAGEAL BANDING  08/30/2018   Procedure: ESOPHAGEAL BANDING;  Surgeon: Yetta Flock, MD;  Location: [facility];  Service: Gastroenterology;;   ESOPHAGOGASTRODUODENOSCOPY N/A 04/27/2018   Procedure: ESOPHAGOGASTRODUODENOSCOPY (EGD);  Surgeon:  Milus Banister, MD;  Location: [facility] ENDOSCOPY;  Service: Endoscopy;  Laterality: N/A;   ESOPHAGOGASTRODUODENOSCOPY (EGD) WITH PROPOFOL N/A 04/25/2018   Procedure: ESOPHAGOGASTRODUODENOSCOPY (EGD) WITH PROPOFOL;  Surgeon: Yetta Flock, MD;  Location: [place];  Service: Gastroenterology;  Laterality: N/A;   ESOPHAGOGASTRODUODENOSCOPY (EGD) WITH PROPOFOL N/A 06/02/2018   Procedure: ESOPHAGOGASTRODUODENOSCOPY (EGD) WITH PROPOFOL;  Surgeon: Doran Stabler, MD;  Location: [place];  Service: Gastroenterology;  Laterality: N/A;   ESOPHAGOGASTRODUODENOSCOPY (EGD) WITH PROPOFOL N/A 07/07/2018   Procedure: ESOPHAGOGASTRODUODENOSCOPY (EGD) WITH PROPOFOL;  Surgeon: Yetta Flock, MD;  Location: [facility];  Service: Gastroenterology;  Laterality: N/A;   ESOPHAGOGASTRODUODENOSCOPY (EGD) WITH PROPOFOL N/A 07/27/2018   Procedure: ESOPHAGOGASTRODUODENOSCOPY (EGD) WITH PROPOFOL;  Surgeon: Yetta Flock, MD;  Location: [facility];  Service: Gastroenterology;  Laterality: N/A;   ESOPHAGOGASTRODUODENOSCOPY (EGD) WITH PROPOFOL N/A 08/30/2018   Procedure: ESOPHAGOGASTRODUODENOSCOPY (EGD) WITH PROPOFOL;  Surgeon: Yetta Flock, MD;  Location: [facility];  Service: Gastroenterology;  Laterality: N/A;   ESOPHAGOGASTRODUODENOSCOPY (EGD) WITH PROPOFOL N/A 11/08/2018   Procedure: ESOPHAGOGASTRODUODENOSCOPY (EGD) WITH PROPOFOL;  Surgeon: Yetta Flock, MD;  Location: [facility];  Service: Gastroenterology;  Laterality: N/A;   ESOPHAGOGASTRODUODENOSCOPY (EGD) WITH PROPOFOL N/A 01/31/2019   Procedure: ESOPHAGOGASTRODUODENOSCOPY (EGD) WITH PROPOFOL;  Surgeon: Yetta Flock, MD;  Location: [facility];  Service: Gastroenterology;  Laterality: N/A;   ESOPHAGOGASTRODUODENOSCOPY (EGD) WITH PROPOFOL N/A 08/01/2019   Procedure: ESOPHAGOGASTRODUODENOSCOPY (EGD) WITH PROPOFOL;  Surgeon: Yetta Flock, MD;  Location: [facility];  Service: Gastroenterology;  Laterality: N/A;    ESOPHAGOGASTRODUODENOSCOPY (EGD) WITH PROPOFOL N/A 08/07/2020   Procedure: ESOPHAGOGASTRODUODENOSCOPY (EGD) WITH PROPOFOL;  Surgeon: Yetta Flock, MD;  Location: [facility];  Service: Gastroenterology;  Laterality: N/A;   IR PARACENTESIS  04/26/2018   IR PARACENTESIS  05/18/2018   IR PARACENTESIS  05/31/2018   IR PARACENTESIS  06/30/2018   IR PARACENTESIS  08/05/2018   IR PARACENTESIS  08/31/2018   IR PARACENTESIS  10/08/2018   IR PARACENTESIS  10/28/2018   IR PARACENTESIS  02/14/2019   IR PARACENTESIS  03/09/2019   IR PARACENTESIS  04/05/2019   IR PARACENTESIS  05/03/2019   IR PARACENTESIS  06/20/2019   IR PARACENTESIS  07/19/2019   IR PARACENTESIS  08/19/2019   IR PARACENTESIS  09/27/2019   IR PARACENTESIS  10/07/2019   IR PARACENTESIS  10/28/2019   IR PARACENTESIS  11/16/2019   IR PARACENTESIS  12/30/2019   IR PARACENTESIS  01/16/2020   IR PARACENTESIS  02/07/2020   IR PARACENTESIS  03/06/2020   IR PARACENTESIS  04/04/2020   IR PARACENTESIS  04/17/2020   IR PARACENTESIS  05/04/2020   IR PARACENTESIS  06/07/2020   IR PARACENTESIS  06/28/2020   IR PARACENTESIS  07/27/2020   IR PARACENTESIS  08/20/2020   IR PARACENTESIS  09/13/2020   IR PARACENTESIS  10/04/2020   IR PARACENTESIS  10/25/2020   IR PARACENTESIS  11/07/2020   IR PARACENTESIS  11/30/2020   IR PARACENTESIS  12/13/2020   IR RADIOLOGIST EVAL & MGMT  12/23/2018   IR THORACENTESIS ASP PLEURAL SPACE W/IMG GUIDE  12/10/2018   IR THORACENTESIS ASP PLEURAL SPACE W/IMG GUIDE  01/21/2019  Family History  Problem Relation Age of Onset   Colon cancer Neg Hx    Esophageal cancer Neg Hx    Pancreatic cancer Neg Hx    Stomach cancer Neg Hx    Social History   Socioeconomic History   Marital status: Single    Spouse name: Not on file   Number of children: 8   Years of education: Not on file   Highest education level: Not on file  Occupational History   Occupation: disability  Tobacco Use   Smoking status: Some Days    Packs/day: 1.00     Years: 20.00    Pack years: 20.00    Types: Cigarettes   Smokeless tobacco: Never   Tobacco comments:    5 a day  Vaping Use   Vaping Use: Never used  Substance and Sexual Activity   Alcohol use: Not Currently    Comment: drinks a beer every few days    Drug use: Not Currently   Sexual activity: Not on file  Other Topics Concern   Not on file  Social History Narrative   Not on file   Social Determinants of Health   Financial Resource Strain: Not on file  Food Insecurity: Not on file  Transportation Needs: Not on file  Physical Activity: Not on file  Stress: Not on file  Social Connections: Not on file    Tobacco Counseling Ready to quit: Not Answered Counseling given: Not Answered Tobacco comments: 5 a day   Clinical Intake:                 Diabetic?***         Activities of Daily Living No flowsheet data found.  Patient Care Team: Billie Ruddy, MD as PCP - General (Family Medicine)  Indicate any recent Medical Services you may have received from other than [facility] providers in the past year (date may be approximate).     Assessment:   This is a routine wellness examination for [place].  Hearing/Vision screen No results found.  Dietary issues and exercise activities discussed:     Goals Addressed   None    Depression Screen No flowsheet data found.  Fall Risk No flowsheet data found.  FALL RISK PREVENTION PERTAINING TO THE HOME:  Any stairs in or around the home? {YES/NO:21197} If so, are there any without handrails? {YES/NO:21197} Home free of loose throw rugs in walkways, pet beds, electrical cords, etc? {YES/NO:21197} Adequate lighting in your home to reduce risk of falls? {YES/NO:21197}  ASSISTIVE DEVICES UTILIZED TO PREVENT FALLS:  Life alert? {YES/NO:21197} Use of a cane, walker or w/c? {YES/NO:21197} Grab bars in the bathroom? {YES/NO:21197} Shower chair or bench in shower? {YES/NO:21197} Elevated toilet seat or a  handicapped toilet? {YES/NO:21197}  TIMED UP AND GO:  Was the test performed? {YES/NO:21197}.  Length of time to ambulate 10 feet: *** sec.   {Appearance of HUDJ:[id number]}  Cognitive Function:        Immunizations Immunization History  Administered Date(s) Administered   Hepatitis A, Adult 06/29/2018, 12/29/2018   PFIZER(Purple Top)SARS-COV-2 Vaccination 12/02/2019, 12/23/2019, 06/20/2020    {TDAP status:2101805}  {Flu Vaccine status:2101806}  {Pneumococcal vaccine status:2101807}  {Covid-19 vaccine status:2101808}  Qualifies for Shingles Vaccine? {YES/NO:21197}  Zostavax completed {YES/NO:21197}  {Shingrix Completed?:2101804}  Screening Tests Health Maintenance  Topic Date Due   Pneumococcal Vaccine 60-54 Years old (1 - PCV) Never done   TETANUS/TDAP  Never done   Zoster Vaccines- Shingrix (1 of 2) Never done  PAP SMEAR-Modifier  Never done   MAMMOGRAM  Never done   COVID-19 Vaccine (4 - Booster for Pfizer series) 09/18/2020   INFLUENZA VACCINE  01/21/2021   COLONOSCOPY (Pts 45-50yrs Insurance coverage will need to be confirmed)  07/31/2029   Hepatitis C Screening  Completed   HIV Screening  Completed   HPV VACCINES  Aged Out    Health Maintenance  Health Maintenance Due  Topic Date Due   Pneumococcal Vaccine 76-23 Years old (1 - PCV) Never done   TETANUS/TDAP  Never done   Zoster Vaccines- Shingrix (1 of 2) Never done   PAP SMEAR-Modifier  Never done   MAMMOGRAM  Never done   COVID-19 Vaccine (4 - Booster for Pfizer series) 09/18/2020    {Colorectal cancer screening:2101809}  {Mammogram status:21018020}  {Bone Density status:21018021}  Lung Cancer Screening: (Low Dose CT Chest recommended if Age 29-80 years, 30 pack-year currently smoking OR have quit w/in 15years.) {DOES NOT does:27190::"does not"} qualify.   Lung Cancer Screening Referral: ***  Additional Screening:  Hepatitis C Screening: {DOES NOT does:27190::"does not"} qualify; Completed  ***  Vision Screening: Recommended annual ophthalmology exams for early detection of glaucoma and other disorders of the eye. Is the patient up to date with their annual eye exam?  {YES/NO:21197} Who is the provider or what is the name of the office in which the patient attends annual eye exams? *** If pt is not established with a provider, would they like to be referred to a provider to establish care? {YES/NO:21197}.   Dental Screening: Recommended annual dental exams for proper oral hygiene  Community Resource Referral / Chronic Care Management: CRR required this visit?  {YES/NO:21197}  CCM required this visit?  {YES/NO:21197}     Plan:     I have personally reviewed and noted the following in the patients chart:   Medical and social history Use of alcohol, tobacco or illicit drugs  Current medications and supplements including opioid prescriptions. {Opioid Prescriptions:[phone]} Functional ability and status Nutritional status Physical activity Advanced directives List of other physicians Hospitalizations, surgeries, and ER visits in previous 12 months Vitals Screenings to include cognitive, depression, and falls Referrals and appointments  In addition, I have reviewed and discussed with patient certain preventive protocols, quality metrics, and best practice recommendations. A written personalized care plan for preventive services as well as general preventive health recommendations were provided to patient.     Katie Brace, LPN   2/45/8099   Nurse Notes: ***

## 2020-12-18 NOTE — Patient Instructions (Signed)
Ms. Rotolo , Thank you for taking time to come for your Medicare Wellness Visit. I appreciate your ongoing commitment to your health goals. Please review the following plan we discussed and let me know if I can assist you in the future.   Screening recommendations/referrals: Colonoscopy: Done 08/01/19 repeat 10 years 07/31/29 Mammogram: order placed 12/18/20 repeat every year Recommended yearly ophthalmology/optometry visit for glaucoma screening and checkup Recommended yearly dental visit for hygiene and checkup  Vaccinations: Influenza vaccine: Due 01/21/21 Pneumococcal vaccine: Due and discussed Tdap vaccine: Due and discussed Shingles vaccine: Shingrix discussed. Please contact your pharmacy for coverage information.   Covid-19: Completed 6/11, 7/2, & 06/20/20  Advanced directives: Advance directive discussed with you today. Even though you declined this today please call our office should you change your mind and we can give you the proper paperwork for you to fill out.  Conditions/risks identified: None at this time  Next appointment: Follow up in one year for your annual wellness visit.   Preventive Care 40-64 Years, Female Preventive care refers to lifestyle choices and visits with your health care provider that can promote health and wellness. What does preventive care include? A yearly physical exam. This is also called an annual well check. Dental exams once or twice a year. Routine eye exams. Ask your health care provider how often you should have your eyes checked. Personal lifestyle choices, including: Daily care of your teeth and gums. Regular physical activity. Eating a healthy diet. Avoiding tobacco and drug use. Limiting alcohol use. Practicing safe sex. Taking low-dose aspirin daily starting at age 26. Taking vitamin and mineral supplements as recommended by your health care provider. What happens during an annual well check? The services and screenings done by your  health care provider during your annual well check will depend on your age, overall health, lifestyle risk factors, and family history of disease. Counseling  Your health care provider may ask you questions about your: Alcohol use. Tobacco use. Drug use. Emotional well-being. Home and relationship well-being. Sexual activity. Eating habits. Work and work Statistician. Method of birth control. Menstrual cycle. Pregnancy history. Screening  You may have the following tests or measurements: Height, weight, and BMI. Blood pressure. Lipid and cholesterol levels. These may be checked every 5 years, or more frequently if you are over 83 years old. Skin check. Lung cancer screening. You may have this screening every year starting at age 63 if you have a 30-pack-year history of smoking and currently smoke or have quit within the past 15 years. Fecal occult blood test (FOBT) of the stool. You may have this test every year starting at age 81. Flexible sigmoidoscopy or colonoscopy. You may have a sigmoidoscopy every 5 years or a colonoscopy every 10 years starting at age 27. Hepatitis C blood test. Hepatitis B blood test. Sexually transmitted disease (STD) testing. Diabetes screening. This is done by checking your blood sugar (glucose) after you have not eaten for a while (fasting). You may have this done every 1-3 years. Mammogram. This may be done every 1-2 years. Talk to your health care provider about when you should start having regular mammograms. This may depend on whether you have a family history of breast cancer. BRCA-related cancer screening. This may be done if you have a family history of breast, ovarian, tubal, or peritoneal cancers. Pelvic exam and Pap test. This may be done every 3 years starting at age 21. Starting at age 39, this may be done every 5 years if  you have a Pap test in combination with an HPV test. Bone density scan. This is done to screen for osteoporosis. You may have  this scan if you are at high risk for osteoporosis. Discuss your test results, treatment options, and if necessary, the need for more tests with your health care provider. Vaccines  Your health care provider may recommend certain vaccines, such as: Influenza vaccine. This is recommended every year. Tetanus, diphtheria, and acellular pertussis (Tdap, Td) vaccine. You may need a Td booster every 10 years. Zoster vaccine. You may need this after age 81. Pneumococcal 13-valent conjugate (PCV13) vaccine. You may need this if you have certain conditions and were not previously vaccinated. Pneumococcal polysaccharide (PPSV23) vaccine. You may need one or two doses if you smoke cigarettes or if you have certain conditions. Talk to your health care provider about which screenings and vaccines you need and how often you need them. This information is not intended to replace advice given to you by your health care provider. Make sure you discuss any questions you have with your health care provider. Document Released: 07/06/2015 Document Revised: 02/27/2016 Document Reviewed: 04/10/2015 Elsevier Interactive Patient Education  2017 [employer] Prevention in the Home Falls can cause injuries. They can happen to people of all ages. There are many things you can do to make your home safe and to help prevent falls. What can I do on the outside of my home? Regularly fix the edges of walkways and driveways and fix any cracks. Remove anything that might make you trip as you walk through a door, such as a raised step or threshold. Trim any bushes or trees on the path to your home. Use bright outdoor lighting. Clear any walking paths of anything that might make someone trip, such as rocks or tools. Regularly check to see if handrails are loose or broken. Make sure that both sides of any steps have handrails. Any raised decks and porches should have guardrails on the edges. Have any leaves, snow, or  ice cleared regularly. Use sand or salt on walking paths during winter. Clean up any spills in your garage right away. This includes oil or grease spills. What can I do in the bathroom? Use night lights. Install grab bars by the toilet and in the tub and shower. Do not use towel bars as grab bars. Use non-skid mats or decals in the tub or shower. If you need to sit down in the shower, use a plastic, non-slip stool. Keep the floor dry. Clean up any water that spills on the floor as soon as it happens. Remove soap buildup in the tub or shower regularly. Attach bath mats securely with double-sided non-slip rug tape. Do not have throw rugs and other things on the floor that can make you trip. What can I do in the bedroom? Use night lights. Make sure that you have a light by your bed that is easy to reach. Do not use any sheets or blankets that are too big for your bed. They should not hang down onto the floor. Have a firm chair that has side arms. You can use this for support while you get dressed. Do not have throw rugs and other things on the floor that can make you trip. What can I do in the kitchen? Clean up any spills right away. Avoid walking on wet floors. Keep items that you use a lot in easy-to-reach places. If you need to reach something above  you, use a strong step stool that has a grab bar. Keep electrical cords out of the way. Do not use floor polish or wax that makes floors slippery. If you must use wax, use non-skid floor wax. Do not have throw rugs and other things on the floor that can make you trip. What can I do with my stairs? Do not leave any items on the stairs. Make sure that there are handrails on both sides of the stairs and use them. Fix handrails that are broken or loose. Make sure that handrails are as long as the stairways. Check any carpeting to make sure that it is firmly attached to the stairs. Fix any carpet that is loose or worn. Avoid having throw rugs at  the top or bottom of the stairs. If you do have throw rugs, attach them to the floor with carpet tape. Make sure that you have a light switch at the top of the stairs and the bottom of the stairs. If you do not have them, ask someone to add them for you. What else can I do to help prevent falls? Wear shoes that: Do not have high heels. Have rubber bottoms. Are comfortable and fit you well. Are closed at the toe. Do not wear sandals. If you use a stepladder: Make sure that it is fully opened. Do not climb a closed stepladder. Make sure that both sides of the stepladder are locked into place. Ask someone to hold it for you, if possible. Clearly mark and make sure that you can see: Any grab bars or handrails. First and last steps. Where the edge of each step is. Use tools that help you move around (mobility aids) if they are needed. These include: Canes. Walkers. Scooters. Crutches. Turn on the lights when you go into a dark area. Replace any light bulbs as soon as they burn out. Set up your furniture so you have a clear path. Avoid moving your furniture around. If any of your floors are uneven, fix them. If there are any pets around you, be aware of where they are. Review your medicines with your doctor. Some medicines can make you feel dizzy. This can increase your chance of falling. Ask your doctor what other things that you can do to help prevent falls. This information is not intended to replace advice given to you by your health care provider. Make sure you discuss any questions you have with your health care provider. Document Released: 04/05/2009 Document Revised: 11/15/2015 Document Reviewed: 07/14/2014 Elsevier Interactive Patient Education  2017 [employer].

## 2020-12-18 NOTE — Addendum Note (Signed)
Addended by: Rodrigo Ran on: 12/18/2020 04:25 PM   Modules accepted: Orders

## 2020-12-18 NOTE — Progress Notes (Signed)
Virtual Visit via Telephone Note  I connected with  Katie Holland on 12/18/20 at  1:45 PM EDT by telephone and verified that I am speaking with the correct person using two identifiers.  Medicare Annual Wellness visit completed telephonically due to Covid-19 pandemic.   Persons participating in this call: This Health Coach and this patient.   Location: Patient: Home Provider: Office   I discussed the limitations, risks, security and privacy concerns of performing an evaluation and management service by telephone and the availability of in person appointments. The patient expressed understanding and agreed to proceed.  Unable to perform video visit due to video visit attempted and failed and/or patient does not have video capability.   Some vital signs may be absent or patient reported.   Willette Brace, LPN   Subjective:   Katie Holland is a 55 y.o. female who presents for an Initial Medicare Annual Wellness Visit.  Review of Systems     Cardiac Risk Factors include: smoking/ tobacco exposure     Objective:    Today's Vitals   12/18/20 1345  PainSc: 9    There is no height or weight on file to calculate BMI.  Advanced Directives 12/18/2020 12/07/2019 08/18/2019 08/01/2019 03/09/2019 02/14/2019 01/31/2019  Does Patient Have a Medical Advance Directive? No No No No Yes No No  Type of Advance Directive - - - - Living will - -  Does patient want to make changes to medical advance directive? - - - - No - Patient declined - -  Would patient like information on creating a medical advance directive? No - Patient declined - No - Patient declined No - Patient declined - No - Patient declined No - Patient declined    Current Medications (verified) Outpatient Encounter Medications as of 12/18/2020  Medication Sig   furosemide (LASIX) 20 MG tablet Take 1 tablet (20 mg total) by mouth 2 (two) times daily.   spironolactone (ALDACTONE) 100 MG tablet Take 1 tablet (100 mg total) by mouth daily.    Facility-Administered Encounter Medications as of 12/18/2020  Medication   lactated ringers infusion   lactated ringers infusion    Allergies (verified) Contrast media [iodinated diagnostic agents], Latex, and Sulfa antibiotics   History: Past Medical History:  Diagnosis Date   Alcoholic liver disease (HCC)    Chronic back pain    on disability   Cirrhosis of liver (HCC)    Esophageal varices (HCC)    GERD (gastroesophageal reflux disease)    Headache    Jaundice    Past Surgical History:  Procedure Laterality Date   BACK SURGERY     L3-L4   BIOPSY  08/07/2020   Procedure: BIOPSY;  Surgeon: Yetta Flock, MD;  Location: [facility];  Service: Gastroenterology;;   COLONOSCOPY WITH PROPOFOL N/A 08/01/2019   Procedure: COLONOSCOPY WITH PROPOFOL;  Surgeon: Yetta Flock, MD;  Location: [facility];  Service: Gastroenterology;  Laterality: N/A;   ESOPHAGEAL BANDING  04/25/2018   Procedure: ESOPHAGEAL BANDING;  Surgeon: Yetta Flock, MD;  Location: [facility] ENDOSCOPY;  Service: Gastroenterology;;   ESOPHAGEAL BANDING  04/27/2018   Procedure: ESOPHAGEAL BANDING;  Surgeon: Milus Banister, MD;  Location: [place];  Service: Endoscopy;;   ESOPHAGEAL BANDING  06/02/2018   Procedure: ESOPHAGEAL BANDING;  Surgeon: Doran Stabler, MD;  Location: [place];  Service: Gastroenterology;;   ESOPHAGEAL BANDING N/A 07/07/2018   Procedure: ESOPHAGEAL BANDING;  Surgeon: Yetta Flock, MD;  Location: [facility];  Service:  Gastroenterology;  Laterality: N/A;   ESOPHAGEAL BANDING  07/27/2018   Procedure: ESOPHAGEAL BANDING;  Surgeon: Yetta Flock, MD;  Location: [facility];  Service: Gastroenterology;;   ESOPHAGEAL BANDING  08/30/2018   Procedure: ESOPHAGEAL BANDING;  Surgeon: Yetta Flock, MD;  Location: [facility];  Service: Gastroenterology;;   ESOPHAGOGASTRODUODENOSCOPY N/A 04/27/2018   Procedure: ESOPHAGOGASTRODUODENOSCOPY (EGD);  Surgeon:  Milus Banister, MD;  Location: [facility] ENDOSCOPY;  Service: Endoscopy;  Laterality: N/A;   ESOPHAGOGASTRODUODENOSCOPY (EGD) WITH PROPOFOL N/A 04/25/2018   Procedure: ESOPHAGOGASTRODUODENOSCOPY (EGD) WITH PROPOFOL;  Surgeon: Yetta Flock, MD;  Location: [place];  Service: Gastroenterology;  Laterality: N/A;   ESOPHAGOGASTRODUODENOSCOPY (EGD) WITH PROPOFOL N/A 06/02/2018   Procedure: ESOPHAGOGASTRODUODENOSCOPY (EGD) WITH PROPOFOL;  Surgeon: Doran Stabler, MD;  Location: [place];  Service: Gastroenterology;  Laterality: N/A;   ESOPHAGOGASTRODUODENOSCOPY (EGD) WITH PROPOFOL N/A 07/07/2018   Procedure: ESOPHAGOGASTRODUODENOSCOPY (EGD) WITH PROPOFOL;  Surgeon: Yetta Flock, MD;  Location: [facility];  Service: Gastroenterology;  Laterality: N/A;   ESOPHAGOGASTRODUODENOSCOPY (EGD) WITH PROPOFOL N/A 07/27/2018   Procedure: ESOPHAGOGASTRODUODENOSCOPY (EGD) WITH PROPOFOL;  Surgeon: Yetta Flock, MD;  Location: [facility];  Service: Gastroenterology;  Laterality: N/A;   ESOPHAGOGASTRODUODENOSCOPY (EGD) WITH PROPOFOL N/A 08/30/2018   Procedure: ESOPHAGOGASTRODUODENOSCOPY (EGD) WITH PROPOFOL;  Surgeon: Yetta Flock, MD;  Location: [facility];  Service: Gastroenterology;  Laterality: N/A;   ESOPHAGOGASTRODUODENOSCOPY (EGD) WITH PROPOFOL N/A 11/08/2018   Procedure: ESOPHAGOGASTRODUODENOSCOPY (EGD) WITH PROPOFOL;  Surgeon: Yetta Flock, MD;  Location: [facility];  Service: Gastroenterology;  Laterality: N/A;   ESOPHAGOGASTRODUODENOSCOPY (EGD) WITH PROPOFOL N/A 01/31/2019   Procedure: ESOPHAGOGASTRODUODENOSCOPY (EGD) WITH PROPOFOL;  Surgeon: Yetta Flock, MD;  Location: [facility];  Service: Gastroenterology;  Laterality: N/A;   ESOPHAGOGASTRODUODENOSCOPY (EGD) WITH PROPOFOL N/A 08/01/2019   Procedure: ESOPHAGOGASTRODUODENOSCOPY (EGD) WITH PROPOFOL;  Surgeon: Yetta Flock, MD;  Location: [facility];  Service: Gastroenterology;  Laterality: N/A;    ESOPHAGOGASTRODUODENOSCOPY (EGD) WITH PROPOFOL N/A 08/07/2020   Procedure: ESOPHAGOGASTRODUODENOSCOPY (EGD) WITH PROPOFOL;  Surgeon: Yetta Flock, MD;  Location: [facility];  Service: Gastroenterology;  Laterality: N/A;   IR PARACENTESIS  04/26/2018   IR PARACENTESIS  05/18/2018   IR PARACENTESIS  05/31/2018   IR PARACENTESIS  06/30/2018   IR PARACENTESIS  08/05/2018   IR PARACENTESIS  08/31/2018   IR PARACENTESIS  10/08/2018   IR PARACENTESIS  10/28/2018   IR PARACENTESIS  02/14/2019   IR PARACENTESIS  03/09/2019   IR PARACENTESIS  04/05/2019   IR PARACENTESIS  05/03/2019   IR PARACENTESIS  06/20/2019   IR PARACENTESIS  07/19/2019   IR PARACENTESIS  08/19/2019   IR PARACENTESIS  09/27/2019   IR PARACENTESIS  10/07/2019   IR PARACENTESIS  10/28/2019   IR PARACENTESIS  11/16/2019   IR PARACENTESIS  12/30/2019   IR PARACENTESIS  01/16/2020   IR PARACENTESIS  02/07/2020   IR PARACENTESIS  03/06/2020   IR PARACENTESIS  04/04/2020   IR PARACENTESIS  04/17/2020   IR PARACENTESIS  05/04/2020   IR PARACENTESIS  06/07/2020   IR PARACENTESIS  06/28/2020   IR PARACENTESIS  07/27/2020   IR PARACENTESIS  08/20/2020   IR PARACENTESIS  09/13/2020   IR PARACENTESIS  10/04/2020   IR PARACENTESIS  10/25/2020   IR PARACENTESIS  11/07/2020   IR PARACENTESIS  11/30/2020   IR PARACENTESIS  12/13/2020   IR RADIOLOGIST EVAL & MGMT  12/23/2018   IR THORACENTESIS ASP PLEURAL SPACE W/IMG GUIDE  12/10/2018   IR THORACENTESIS  ASP PLEURAL SPACE W/IMG GUIDE  01/21/2019   Family History  Problem Relation Age of Onset   Colon cancer Neg Hx    Esophageal cancer Neg Hx    Pancreatic cancer Neg Hx    Stomach cancer Neg Hx    Social History   Socioeconomic History   Marital status: Single    Spouse name: Not on file   Number of children: 8   Years of education: Not on file   Highest education level: Not on file  Occupational History   Occupation: disability  Tobacco Use   Smoking status: Some Days    Packs/day: 0.25     Years: 20.00    Pack years: 5.00    Types: Cigarettes   Smokeless tobacco: Never   Tobacco comments:    5 a day  Vaping Use   Vaping Use: Never used  Substance and Sexual Activity   Alcohol use: Not Currently    Comment: drinks a beer every few days    Drug use: Not Currently   Sexual activity: Not on file  Other Topics Concern   Not on file  Social History Narrative   Not on file   Social Determinants of Health   Financial Resource Strain: Low Risk    Difficulty of Paying Living Expenses: Not hard at all  Food Insecurity: No Food Insecurity   Worried About Charity fundraiser in the Last Year: Never true   Ran Out of Food in the Last Year: Never true  Transportation Needs: No Transportation Needs   Lack of Transportation (Medical): No   Lack of Transportation (Non-Medical): No  Physical Activity: Inactive   Days of Exercise per Week: 0 days   Minutes of Exercise per Session: 0 min  Stress: No Stress Concern Present   Feeling of Stress : Not at all  Social Connections: Unknown   Frequency of Communication with Friends and Family: More than three times a week   Frequency of Social Gatherings with Friends and Family: Once a week   Attends Religious Services: Never   Marine scientist or Organizations: No   Attends Music therapist: Never   Marital Status: Not on file    Tobacco Counseling Ready to quit: Not Answered Counseling given: Not Answered Tobacco comments: 5 a day   Clinical Intake:  Pre-visit preparation completed: Yes  Pain : 0-10 (legs swollen and feet) Pain Score: 9  Pain Type: Chronic pain Pain Location: Leg Pain Orientation: Right Pain Descriptors / Indicators: Aching, Squeezing Pain Onset: More than a month ago Pain Frequency: Intermittent     BMI - recorded: 21.11 Nutritional Risks: None Diabetes: No  How often do you need to have someone help you when you read instructions, pamphlets, or other written materials from  your doctor or pharmacy?: 1 - Never  Diabetic?No  Interpreter Needed?: No  Information entered by :: Charlott Rakes, LPN   Activities of Daily Living In your present state of health, do you have any difficulty performing the following activities: 12/18/2020  Hearing? N  Vision? N  Difficulty concentrating or making decisions? N  Walking or climbing stairs? Y  Dressing or bathing? N  Doing errands, shopping? N  Preparing Food and eating ? N  Using the Toilet? N  In the past six months, have you accidently leaked urine? Y  Comment at times urgency  Do you have problems with loss of bowel control? N  Managing your Medications? N  Managing your  Finances? N  Housekeeping or managing your Housekeeping? N  Some recent data might be hidden    Patient Care Team: Billie Ruddy, MD as PCP - General (Family Medicine)  Indicate any recent Medical Services you may have received from other than [facility] providers in the past year (date may be approximate).     Assessment:   This is a routine wellness examination for [place].  Hearing/Vision screen Hearing Screening - Comments:: Pt denies any hearing issues  Vision Screening - Comments:: Pt follows up with the eye care center Dr Jackie Plum for annual eye exams   Dietary issues and exercise activities discussed: Current Exercise Habits: The patient does not participate in regular exercise at present   Goals Addressed             This Visit's Progress    Patient Stated       Daily walk        Depression Screen PHQ 2/9 Scores 12/18/2020  PHQ - 2 Score 0    Fall Risk Fall Risk  12/18/2020  Falls in the past year? 0  Number falls in past yr: 0  Injury with Fall? 0  Risk for fall due to : Impaired vision;Impaired balance/gait;Impaired mobility  Follow up Falls prevention discussed    FALL RISK PREVENTION PERTAINING TO THE HOME:  Any stairs in or around the home? Yes  If so, are there any without handrails? No  Home free of  loose throw rugs in walkways, pet beds, electrical cords, etc? Yes  Adequate lighting in your home to reduce risk of falls? Yes   ASSISTIVE DEVICES UTILIZED TO PREVENT FALLS:  Life alert? Yes  Use of a cane, walker or w/c? Yes  Grab bars in the bathroom? No  Shower chair or bench in shower? No  Elevated toilet seat or a handicapped toilet? No   TIMED UP AND GO:  Was the test performed? No .     Cognitive Function:     6CIT Screen 12/18/2020  What Year? 4 points  What month? 0 points  What time? 0 points  Count back from 20 0 points  Months in reverse 0 points  Repeat phrase 2 points  Total Score 6    Immunizations Immunization History  Administered Date(s) Administered   Hepatitis A, Adult 06/29/2018, 12/29/2018   PFIZER(Purple Top)SARS-COV-2 Vaccination 12/02/2019, 12/23/2019, 06/20/2020    TDAP status: Due, Education has been provided regarding the importance of this vaccine. Advised may receive this vaccine at local pharmacy or Health Dept. Aware to provide a copy of the vaccination record if obtained from local pharmacy or Health Dept. Verbalized acceptance and understanding.  Flu Vaccine status: Due, Education has been provided regarding the importance of this vaccine. Advised may receive this vaccine at local pharmacy or Health Dept. Aware to provide a copy of the vaccination record if obtained from local pharmacy or Health Dept. Verbalized acceptance and understanding.  Pneumococcal vaccine status: Due, Education has been provided regarding the importance of this vaccine. Advised may receive this vaccine at local pharmacy or Health Dept. Aware to provide a copy of the vaccination record if obtained from local pharmacy or Health Dept. Verbalized acceptance and understanding.  Covid-19 vaccine status: Completed vaccines  Qualifies for Shingles Vaccine? Yes   Zostavax completed No   Shingrix Completed?: No.    Education has been provided regarding the importance of  this vaccine. Patient has been advised to call insurance company to determine out of pocket expense if  they have not yet received this vaccine. Advised may also receive vaccine at local pharmacy or Health Dept. Verbalized acceptance and understanding.  Screening Tests Health Maintenance  Topic Date Due   Pneumococcal Vaccine 59-3 Years old (1 - PCV) Never done   TETANUS/TDAP  Never done   Zoster Vaccines- Shingrix (1 of 2) Never done   PAP SMEAR-Modifier  Never done   MAMMOGRAM  Never done   COVID-19 Vaccine (4 - Booster for Pfizer series) 09/18/2020   INFLUENZA VACCINE  01/21/2021   COLONOSCOPY (Pts 45-28yrs Insurance coverage will need to be confirmed)  07/31/2029   Hepatitis C Screening  Completed   HIV Screening  Completed   HPV VACCINES  Aged Out    Health Maintenance  Health Maintenance Due  Topic Date Due   Pneumococcal Vaccine 36-44 Years old (1 - PCV) Never done   TETANUS/TDAP  Never done   Zoster Vaccines- Shingrix (1 of 2) Never done   PAP SMEAR-Modifier  Never done   MAMMOGRAM  Never done   COVID-19 Vaccine (4 - Booster for Pfizer series) 09/18/2020    Colorectal cancer screening: Type of screening: Colonoscopy. Completed 08/01/19. Repeat every 10 years  Mammogram status: Ordered 12/18/20. Pt provided with contact info and advised to call to schedule appt.     Lung Cancer Screening: (Low Dose CT Chest recommended if Age 75-80 years, 30 pack-year currently smoking OR have quit w/in 15years.) does qualify.   Lung Cancer Screening Referral: Placed 12/18/20  Additional Screening:  Hepatitis C Screening:  Completed 04/25/18  Vision Screening: Recommended annual ophthalmology exams for early detection of glaucoma and other disorders of the eye. Is the patient up to date with their annual eye exam?  Yes  Who is the provider or what is the name of the office in which the patient attends annual eye exams? Dr Jackie Plum If pt is not established with a provider, would they like  to be referred to a provider to establish care? No .   Dental Screening: Recommended annual dental exams for proper oral hygiene  Community Resource Referral / Chronic Care Management: CRR required this visit?  No   CCM required this visit?  No      Plan:     I have personally reviewed and noted the following in the patient's chart:   Medical and social history Use of alcohol, tobacco or illicit drugs  Current medications and supplements including opioid prescriptions. Patient is not currently taking opioid prescriptions. Functional ability and status Nutritional status Physical activity Advanced directives List of other physicians Hospitalizations, surgeries, and ER visits in previous 12 months Vitals Screenings to include cognitive, depression, and falls Referrals and appointments  In addition, I have reviewed and discussed with patient certain preventive protocols, quality metrics, and best practice recommendations. A written personalized care plan for preventive services as well as general preventive health recommendations were provided to patient.     Willette Brace, LPN   [id number]   Nurse Notes: Pt is requesting a referral for a GYN provider to complete Pap Smear.

## 2020-12-20 ENCOUNTER — Telehealth: Payer: Self-pay

## 2020-12-20 NOTE — Telephone Encounter (Signed)
Spoke with patient to remind her that she is due for repeat labs at this time. Patient states that she will be going out of town this evening and won't be able to come in for labs this week. She states that she can come in on Tuesday, 12/25/20. Advised that I will give her a call on Tuesday to remind her. Patient verbalized understanding and had no concerns at the end of the call.

## 2020-12-20 NOTE — Telephone Encounter (Signed)
-----   Message from Yevette Edwards, RN sent at 12/07/2020  3:59 PM EDT ----- Regarding: Labs Repeat CBC, order in epic.

## 2020-12-25 NOTE — Telephone Encounter (Signed)
Spoke with patient to remind her to come in for labs today, she states that she can come within the next 2-3 days because she has to find a ride. Advised patient that no appt is necessary and she can stop by at her convenience between 7:30 am -5 pm. Patient verbalized understanding and had no concerns at the end of the call.

## 2020-12-27 ENCOUNTER — Ambulatory Visit: Payer: Medicare HMO | Admitting: Family Medicine

## 2020-12-27 ENCOUNTER — Ambulatory Visit (HOSPITAL_COMMUNITY)
Admission: RE | Admit: 2020-12-27 | Discharge: 2020-12-27 | Disposition: A | Discharge status: Home / Self Care | Payer: Medicare HMO | Source: Ambulatory Visit | Attending: Gastroenterology | Admitting: Gastroenterology

## 2020-12-27 ENCOUNTER — Other Ambulatory Visit: Payer: Self-pay

## 2020-12-27 DIAGNOSIS — I8501 Esophageal varices with bleeding: Secondary | ICD-10-CM | POA: Diagnosis not present

## 2020-12-27 DIAGNOSIS — R609 Edema, unspecified: Secondary | ICD-10-CM | POA: Diagnosis not present

## 2020-12-27 DIAGNOSIS — K7031 Alcoholic cirrhosis of liver with ascites: Secondary | ICD-10-CM | POA: Diagnosis present

## 2020-12-27 DIAGNOSIS — R188 Other ascites: Secondary | ICD-10-CM | POA: Diagnosis not present

## 2020-12-27 DIAGNOSIS — R69 Illness, unspecified: Secondary | ICD-10-CM | POA: Diagnosis not present

## 2020-12-28 ENCOUNTER — Encounter: Payer: Self-pay | Admitting: Gastroenterology

## 2021-01-02 ENCOUNTER — Telehealth: Payer: Self-pay | Admitting: Gastroenterology

## 2021-01-02 DIAGNOSIS — K7031 Alcoholic cirrhosis of liver with ascites: Secondary | ICD-10-CM

## 2021-01-02 NOTE — Telephone Encounter (Signed)
Spoke with patient, she would like to schedule paracentesis. She states that she has not been on any diuretics for at least 1 month. Last paracentesis was on 6/23, 6.8 L removed. Please advise, thanks

## 2021-01-02 NOTE — Telephone Encounter (Signed)
Yes okay to schedule large-volume paracentesis, albumin if more than 5 L removed.  Send fluid for cell count and differential.  She has not been compliant with follow-up lab work, up to her if she wants to be on diuretics again she will need to be compliant with labs.

## 2021-01-02 NOTE — Telephone Encounter (Signed)
Inbound call from patient requesting a call back to schedule paracentesis. States she is full of fluid and hurting. Best contact number [phone]

## 2021-01-03 NOTE — Telephone Encounter (Signed)
Patient has been scheduled for a paracentesis at [facility] on 01/04/21 at 1 PM, arriving at 12:45 PM. Spoke with patient in regards to her appointment. She would like to continue to stay off of diuretics at this time. Patient verbalized understanding and had no concerns at the end of the call.

## 2021-01-04 ENCOUNTER — Ambulatory Visit: Payer: Medicare HMO

## 2021-01-04 ENCOUNTER — Ambulatory Visit (HOSPITAL_COMMUNITY)
Admission: RE | Admit: 2021-01-04 | Discharge: 2021-01-04 | Disposition: A | Discharge status: Home / Self Care | Payer: Medicare HMO | Source: Ambulatory Visit | Attending: Gastroenterology | Admitting: Gastroenterology

## 2021-01-04 ENCOUNTER — Other Ambulatory Visit: Payer: Self-pay

## 2021-01-04 DIAGNOSIS — R69 Illness, unspecified: Secondary | ICD-10-CM | POA: Diagnosis not present

## 2021-01-04 DIAGNOSIS — R188 Other ascites: Secondary | ICD-10-CM | POA: Diagnosis not present

## 2021-01-04 DIAGNOSIS — K7031 Alcoholic cirrhosis of liver with ascites: Secondary | ICD-10-CM | POA: Insufficient documentation

## 2021-01-04 DIAGNOSIS — K7460 Unspecified cirrhosis of liver: Secondary | ICD-10-CM | POA: Diagnosis not present

## 2021-01-04 HISTORY — PX: IR PARACENTESIS: IMG2679

## 2021-01-04 LAB — BODY FLUID CELL COUNT WITH DIFFERENTIAL
Eos, Fluid: 0 %
Lymphs, Fluid: 30 %
Monocyte-Macrophage-Serous Fluid: 26 % — ABNORMAL LOW (ref 50–90)
Neutrophil Count, Fluid: 44 % — ABNORMAL HIGH (ref 0–25)
Total Nucleated Cell Count, Fluid: 49 cu mm (ref 0–1000)

## 2021-01-04 MED ORDER — LIDOCAINE HCL 1 % IJ SOLN
INTRAMUSCULAR | Status: AC
  Filled 2021-01-04: qty 20

## 2021-01-04 MED ORDER — LIDOCAINE HCL 1 % IJ SOLN
INTRAMUSCULAR | Status: DC | PRN
  Administered 2021-01-04: 10 mL

## 2021-01-04 MED ORDER — ALBUMIN HUMAN 25 % IV SOLN
INTRAVENOUS | Status: AC
  Administered 2021-01-04: 50 g via INTRAVENOUS
  Filled 2021-01-04: qty 200

## 2021-01-04 MED ORDER — ALBUMIN HUMAN 25 % IV SOLN
50.0000 g | Freq: Once | INTRAVENOUS | Status: AC
  Filled 2021-01-04: qty 200

## 2021-01-04 NOTE — Procedures (Signed)
PROCEDURE SUMMARY:  Successful US guided paracentesis from RLQ.  Yielded 9.8 L of clear yellow fluid.  No immediate complications.  Pt tolerated well.   Specimen was not sent for labs.  EBL < 37mL  Ascencion Dike PA-C 01/04/2021 2:05 PM

## 2021-01-08 LAB — PATHOLOGIST SMEAR REVIEW

## 2021-01-09 ENCOUNTER — Other Ambulatory Visit: Payer: Self-pay

## 2021-01-10 ENCOUNTER — Ambulatory Visit: Payer: Medicare HMO | Admitting: Family Medicine

## 2021-01-22 ENCOUNTER — Telehealth: Payer: Self-pay | Admitting: Gastroenterology

## 2021-01-22 DIAGNOSIS — K7031 Alcoholic cirrhosis of liver with ascites: Secondary | ICD-10-CM

## 2021-01-22 NOTE — Telephone Encounter (Signed)
Inbound call from patient requesting an appointment to schedule paracentesis

## 2021-01-23 NOTE — Telephone Encounter (Signed)
Patient is calling in to schedule PRN paracentesis for alcoholic cirrhosis with ascites. Last paracentesis was on 7/15 with 9.8 L removed. No diuretics.   Patient is scheduled for a paracentesis at [facility] on Friday, 01/25/21 at 1 PM, arriving at 12:45 PM.   Lm on vm for patient to return call.

## 2021-01-24 DIAGNOSIS — H2511 Age-related nuclear cataract, right eye: Secondary | ICD-10-CM | POA: Diagnosis not present

## 2021-01-24 DIAGNOSIS — H26492 Other secondary cataract, left eye: Secondary | ICD-10-CM | POA: Diagnosis not present

## 2021-01-24 NOTE — Telephone Encounter (Signed)
Spoke with patient in regards to paracentesis appt. Pt verbalized understanding and had no concerns at the end of the call.

## 2021-01-25 ENCOUNTER — Other Ambulatory Visit: Payer: Self-pay

## 2021-01-25 ENCOUNTER — Ambulatory Visit (HOSPITAL_COMMUNITY)
Admission: RE | Admit: 2021-01-25 | Discharge: 2021-01-25 | Disposition: A | Discharge status: Home / Self Care | Payer: Medicare HMO | Source: Ambulatory Visit | Attending: Gastroenterology | Admitting: Gastroenterology

## 2021-01-25 DIAGNOSIS — R188 Other ascites: Secondary | ICD-10-CM | POA: Diagnosis not present

## 2021-01-25 DIAGNOSIS — K7031 Alcoholic cirrhosis of liver with ascites: Secondary | ICD-10-CM

## 2021-01-25 HISTORY — PX: IR PARACENTESIS: IMG2679

## 2021-01-25 LAB — BODY FLUID CELL COUNT WITH DIFFERENTIAL
Eos, Fluid: 0 %
Lymphs, Fluid: 8 %
Monocyte-Macrophage-Serous Fluid: 81 % (ref 50–90)
Neutrophil Count, Fluid: 11 % (ref 0–25)
Total Nucleated Cell Count, Fluid: 82 cu mm (ref 0–1000)

## 2021-01-25 MED ORDER — LIDOCAINE HCL 1 % IJ SOLN
INTRAMUSCULAR | Status: AC
  Filled 2021-01-25: qty 20

## 2021-01-25 MED ORDER — ALBUMIN HUMAN 25 % IV SOLN
75.0000 g | Freq: Once | INTRAVENOUS | Status: AC
  Filled 2021-01-25: qty 300

## 2021-01-25 MED ORDER — ALBUMIN HUMAN 25 % IV SOLN
INTRAVENOUS | Status: AC
  Administered 2021-01-25: 75 g via INTRAVENOUS
  Filled 2021-01-25: qty 300

## 2021-01-25 NOTE — Procedures (Signed)
PROCEDURE SUMMARY:  Successful image-guided paracentesis from the right lower abdomen.  Yielded 8.8 liters of clear yellow fluid.  No immediate complications.  EBL = trace. Patient tolerated well.   Specimen was sent for labs.  Please see imaging section of Epic for full dictation.   Armando Gang Raynie Steinhaus PA-C 01/25/2021 1:39 PM

## 2021-01-29 LAB — PATHOLOGIST SMEAR REVIEW

## 2021-02-01 MED ORDER — FUROSEMIDE 20 MG PO TABS: 40.0000 mg | ORAL_TABLET | Freq: Every day | ORAL | 2 refills | Status: DC

## 2021-02-01 MED ORDER — SPIRONOLACTONE 100 MG PO TABS: 100.0000 mg | ORAL_TABLET | Freq: Every day | ORAL | 3 refills | Status: DC

## 2021-02-01 NOTE — Telephone Encounter (Signed)
Patient is requesting to speak with you regarding her legs being really swollen said she has a lot of fluids.

## 2021-02-01 NOTE — Addendum Note (Signed)
Addended by: Yevette Edwards on: 02/01/2021 12:32 PM   Modules accepted: Orders

## 2021-02-01 NOTE — Telephone Encounter (Signed)
Spoke with patient, she states that she has noticed an increased amount of swelling in her legs. She states that her legs are swollen up to her knees. She describes as "really tight". She states that it it painful. She states that she usually has swelling in her feet but noticed a couple of days ago that the swelling was increasing. She states that she tried soaking her feet but when she woke up they were still very swollen. She states that she has been keeping her feet elevated because it hurts to put them down. She states that she does have compression socks but is not wearing them because they are extremely tight. She states that the swelling is the same in both legs. She states that her belly is already filling up but does not need a paracentesis at this time. She states that her PCP told her to reach out to [place] because she needs to have the TIPS procedure. Please advise, thanks.

## 2021-02-01 NOTE — Telephone Encounter (Signed)
Spoke with patient in regards to Dr. Doyne Keel recommendations. Pt denies any alcohol use. Patient states that she is not on any diuretics at this time. Advised patient that I will send in new prescriptions to her pharmacy on file. Advised patient that she will need to come in for labs next week and it is very important that she come in. Advised that I will call her with a reminder next week. Pt will begin diuretics as soon as she gets them. Patient verbalized understanding and had no concerns at the end of the call.   Lab order in epic. Prescriptions sent to pharmacy on file.

## 2021-02-01 NOTE — Telephone Encounter (Signed)
Sorry to hear this.  Please make sure she is not drinking any alcohol, last time I spoke to her she had been abstinent. I believe she is on Aldactone 100 mg a day and has been holding the Lasix if he can clarify what she is taking for diuretics.  If she is not taking anything she should take Aldactone 100 mg a day and Lasix 40 mg daily and check BMET next week.  If she is taking Aldactone 100 mg a day but not any Lasix, would add Lasix 40 mg daily and check BMET next week.  TIPS is not something we would do acutely for this, she would need to fail high-dose diuretic dosing to be a candidate for this.  Can you please let her know medication recommendations and expect to see her for labs next week to make sure stable.  Over the weekend if this is worsening or she is developing shortness of breath associated with this she would need to go to the ED. thanks

## 2021-02-04 ENCOUNTER — Ambulatory Visit: Payer: Medicare HMO | Admitting: Family Medicine

## 2021-02-07 ENCOUNTER — Telehealth: Payer: Self-pay

## 2021-02-07 ENCOUNTER — Other Ambulatory Visit: Payer: Self-pay

## 2021-02-07 NOTE — Telephone Encounter (Signed)
Spoke with patient to remind her that she is due for repeat labs at this time. No appointment is necessary. Patient is aware that she can stop by the lab in the basement at her convenience today before 5 pm. Patient verbalized understanding and had no concerns at the end of the call.

## 2021-02-07 NOTE — Telephone Encounter (Signed)
-----   Message from Yevette Edwards, RN sent at 02/01/2021 12:30 PM EDT ----- Regarding: Labs BMET, order in epic.

## 2021-02-08 ENCOUNTER — Encounter: Payer: Self-pay | Admitting: Family Medicine

## 2021-02-08 ENCOUNTER — Ambulatory Visit (INDEPENDENT_AMBULATORY_CARE_PROVIDER_SITE_OTHER): Payer: Medicare HMO | Admitting: Family Medicine

## 2021-02-08 VITALS — BP 118/78 | HR 79 | Temp 97.9°F | Wt 137.4 lb

## 2021-02-08 DIAGNOSIS — K7031 Alcoholic cirrhosis of liver with ascites: Secondary | ICD-10-CM

## 2021-02-08 DIAGNOSIS — R7989 Other specified abnormal findings of blood chemistry: Secondary | ICD-10-CM | POA: Diagnosis not present

## 2021-02-08 DIAGNOSIS — R69 Illness, unspecified: Secondary | ICD-10-CM | POA: Diagnosis not present

## 2021-02-08 LAB — CBC WITH DIFFERENTIAL/PLATELET
Basophils Absolute: 0 10*3/uL (ref 0.0–0.1)
Basophils Relative: 0.9 % (ref 0.0–3.0)
Eosinophils Absolute: 0.2 10*3/uL (ref 0.0–0.7)
Eosinophils Relative: 7.4 % — ABNORMAL HIGH (ref 0.0–5.0)
HCT: 30.2 % — ABNORMAL LOW (ref 36.0–46.0)
Hemoglobin: 9.6 g/dL — ABNORMAL LOW (ref 12.0–15.0)
Lymphocytes Relative: 31.2 % (ref 12.0–46.0)
Lymphs Abs: 0.9 10*3/uL (ref 0.7–4.0)
MCHC: 31.7 g/dL (ref 30.0–36.0)
MCV: 72.3 fl — ABNORMAL LOW (ref 78.0–100.0)
Monocytes Absolute: 0.4 10*3/uL (ref 0.1–1.0)
Monocytes Relative: 13.2 % — ABNORMAL HIGH (ref 3.0–12.0)
Neutro Abs: 1.3 10*3/uL — ABNORMAL LOW (ref 1.4–7.7)
Neutrophils Relative %: 47.3 % (ref 43.0–77.0)
Platelets: 158 10*3/uL (ref 150.0–400.0)
RBC: 4.18 Mil/uL (ref 3.87–5.11)
RDW: 22.4 % — ABNORMAL HIGH (ref 11.5–15.5)
WBC: 2.8 10*3/uL — ABNORMAL LOW (ref 4.0–10.5)

## 2021-02-08 LAB — COMPREHENSIVE METABOLIC PANEL
ALT: 15 U/L (ref 0–35)
AST: 26 U/L (ref 0–37)
Albumin: 3.2 g/dL — ABNORMAL LOW (ref 3.5–5.2)
Alkaline Phosphatase: 105 U/L (ref 39–117)
BUN: 15 mg/dL (ref 6–23)
CO2: 22 mEq/L (ref 19–32)
Calcium: 8.8 mg/dL (ref 8.4–10.5)
Chloride: 106 mEq/L (ref 96–112)
Creatinine, Ser: 0.62 mg/dL (ref 0.40–1.20)
GFR: 99.77 mL/min (ref >60.00)
Glucose, Bld: 86 mg/dL (ref 70–99)
Potassium: 3.6 mEq/L (ref 3.5–5.1)
Sodium: 137 mEq/L (ref 135–145)
Total Bilirubin: 1.8 mg/dL — ABNORMAL HIGH (ref 0.2–1.2)
Total Protein: 6.5 g/dL (ref 6.0–8.3)

## 2021-02-08 LAB — AMMONIA: Ammonia: 110 umol/L — ABNORMAL HIGH (ref 11–35)

## 2021-02-08 MED ORDER — LACTULOSE 10 GM/15ML PO SOLN: 20.0000 g | Freq: Every day | ORAL | 0 refills | Status: DC

## 2021-02-08 MED ORDER — POTASSIUM CHLORIDE CRYS ER 20 MEQ PO TBCR
40.0000 meq | EXTENDED_RELEASE_TABLET | Freq: Every day | ORAL | 0 refills | Status: DC
Start: 2021-02-08 — End: 2021-05-21

## 2021-02-08 NOTE — Progress Notes (Signed)
Subjective:    Patient ID: Katie Holland, female    DOB: 05-14-1966, 55 y.o.   MRN: [id number]  Chief Complaint  Patient presents with   Cirrhosis  Accompanied by significant other.  HPI Patient was seen today for f/u on ETOH cirrhosis of the liver and to re-establish care as lost to follow up.  Last seen 10/2017 for est care visit.  Pt states she is ready to have the TIPS procedure.  Had several large volume paracentesis monthly x the last few months.  Followed by GI, Dr. Havery Moros.  Pt states she has not had a drink in 8 months.  Ascites present.  Denies abdominal pain, constipation, nausea, vomiting, jaundice, pruritus, trauma, LE edema.  Per significant other patient with memory changes.  Past Medical History:  Diagnosis Date   Alcoholic liver disease ([place])    Chronic back pain    on disability   Cirrhosis of liver (HCC)    Esophageal varices (HCC)    GERD (gastroesophageal reflux disease)    Headache    Jaundice     Allergies  Allergen Reactions   Contrast Media [Iodinated Diagnostic Agents] Itching and Swelling    Needs 13-hr prep   Latex Itching, Swelling and Other (See Comments)    No breathing impairment, however   Sulfa Antibiotics Itching, Swelling and Other (See Comments)    No breathing impairment, however    ROS General: Denies fever, chills, night sweats, changes in weight, changes in appetite  + memory deficit HEENT: Denies headaches, ear pain, changes in vision, rhinorrhea, sore throat CV: Denies CP, palpitations, SOB, orthopnea Pulm: Denies SOB, cough, wheezing GI: Denies abdominal pain, nausea, vomiting, diarrhea, constipation  + ascites GU: Denies dysuria, hematuria, frequency, vaginal discharge  Msk: Denies muscle cramps, joint pains Neuro: Denies weakness, numbness, tingling Skin: Denies rashes, bruising Psych: Denies depression, anxiety, hallucinations     Objective:    Blood pressure 118/78, pulse 79, temperature 97.9 F (36.6 C), temperature  source Oral, weight 137 lb 6.4 oz (62.3 kg), SpO2 95 %.  Gen. Pleasant, well-nourished, in no distress, normal affect   HEENT: Superior/AT, face symmetric, conjunctiva clear, no scleral icterus, PERRLA, EOMI, nares patent without drainage Lungs: no accessory muscle use, CTAB, no wheezes or rales Cardiovascular: RRR, no m/r/g, no peripheral edema Abdomen: BS present, soft, NT, ascites. Musculoskeletal: No deformities, no cyanosis or clubbing, normal tone Neuro:  A&Ox3, CN II-XII intact, normal gait Skin:  Warm, no lesions/ rash   Wt Readings from Last 3 Encounters:  02/08/21 137 lb 6.4 oz (62.3 kg)  11/08/20 123 lb (55.8 kg)  08/07/20 125 lb (56.7 kg)    Lab Results  Component Value Date   WBC 2.8 (L) 02/08/2021   HGB 9.6 (L) 02/08/2021   HCT 30.2 (L) 02/08/2021   PLT 158.0 02/08/2021   GLUCOSE 86 02/08/2021   ALT 15 02/08/2021   AST 26 02/08/2021   NA 137 02/08/2021   K 3.6 02/08/2021   CL 106 02/08/2021   CREATININE 0.62 02/08/2021   BUN 15 02/08/2021   CO2 22 02/08/2021   INR 1.2 (H) 12/07/2020    Assessment/Plan:  Alcoholic cirrhosis of liver with ascites ([place]) -Patient advised alcohol cessation extremely important.  Will likely need to be alcohol free times at least 1 year to be considered for TIPS. -Patient advised to contact her gastroenterologist for further information about TIPS procedure.  We will also need repeat paracentesis in the next week. - Plan: CMP, CBC with Differential/Platelet, Ammonia  Update: Lab results available.  Ammonia level 110.  Rx for lactulose sent for patient to take over the weekend.  As potassium low normal at 3.6 her take a short course of Klor-Con while taking lactulose.  We will need to have labs rechecked on Monday.  Increased ammonia level  - Plan: lactulose (CHRONULAC) 10 GM/15ML solution, potassium chloride SA (KLOR-CON) 20 MEQ tablet  F/u next week.  Grier Mitts, MD

## 2021-02-14 ENCOUNTER — Encounter: Payer: Self-pay | Admitting: Family Medicine

## 2021-02-18 ENCOUNTER — Telehealth: Payer: Self-pay | Admitting: Gastroenterology

## 2021-02-18 DIAGNOSIS — K7031 Alcoholic cirrhosis of liver with ascites: Secondary | ICD-10-CM

## 2021-02-18 NOTE — Telephone Encounter (Signed)
Patient calling to schedule paracentesis

## 2021-02-18 NOTE — Telephone Encounter (Signed)
Spoke with patient, she states that she is taking Lasix 20 mg daily. Her last paracentesis on 01/25/21, 8.8 L removed. Please advise, thanks.

## 2021-02-18 NOTE — Telephone Encounter (Signed)
[place] I had previously asked her to take lasix '40mg'$  / day and aldactone '100mg'$  / day and follow up with a BMET. I don't see that she did that. If she is not taking this dosing I recommend she do this. If she is not going to be compliant with labs however, I would not start her diuretics. This has been an ongoing issue for her. Otherwise can refer to IR for large volume paracentesis, if > 5L then give albumin. Thanks

## 2021-02-19 NOTE — Telephone Encounter (Signed)
Patient has been scheduled for a paracentesis at [facility] on Wednesday, 02/20/21 at 2 PM, arriving at 1:45 PM.   Spoke with patient in regards to her appt and recommendations. Discussed with patient the importance of being compliant with lab work. Pt states that she will try. Advised that she can start recommended dose of diuretics today and then I will call her in  a week to remind her to come in for labs. Pt is aware that if she is non-compliant with labs we will have to take her off of the diuretics. Pt verbalized understanding of all information and had no concerns at the end of the call.   Lab reminder in epic.

## 2021-02-20 ENCOUNTER — Other Ambulatory Visit: Payer: Self-pay

## 2021-02-20 ENCOUNTER — Ambulatory Visit (HOSPITAL_COMMUNITY)
Admission: RE | Admit: 2021-02-20 | Discharge: 2021-02-20 | Disposition: A | Discharge status: Home / Self Care | Payer: Medicare HMO | Source: Ambulatory Visit | Attending: Gastroenterology | Admitting: Gastroenterology

## 2021-02-20 DIAGNOSIS — K7460 Unspecified cirrhosis of liver: Secondary | ICD-10-CM | POA: Diagnosis not present

## 2021-02-20 DIAGNOSIS — R69 Illness, unspecified: Secondary | ICD-10-CM | POA: Diagnosis not present

## 2021-02-20 DIAGNOSIS — R188 Other ascites: Secondary | ICD-10-CM | POA: Diagnosis not present

## 2021-02-20 DIAGNOSIS — K7031 Alcoholic cirrhosis of liver with ascites: Secondary | ICD-10-CM | POA: Insufficient documentation

## 2021-02-20 HISTORY — PX: IR PARACENTESIS: IMG2679

## 2021-02-20 MED ORDER — LIDOCAINE HCL 1 % IJ SOLN
INTRAMUSCULAR | Status: AC
  Filled 2021-02-20: qty 20

## 2021-02-20 MED ORDER — ALBUMIN HUMAN 25 % IV SOLN: 25.0000 g | Freq: Once | INTRAVENOUS | Status: AC

## 2021-02-20 MED ORDER — LIDOCAINE HCL 1 % IJ SOLN
INTRAMUSCULAR | Status: AC | PRN
  Administered 2021-02-20: 10 mL

## 2021-02-20 MED ORDER — ALBUMIN HUMAN 25 % IV SOLN
INTRAVENOUS | Status: AC
  Filled 2021-02-20: qty 100

## 2021-02-20 MED ORDER — ALBUMIN HUMAN 25 % IV SOLN
50.0000 g | Freq: Once | INTRAVENOUS | Status: AC
  Administered 2021-02-20: 50 g via INTRAVENOUS
  Filled 2021-02-20: qty 200

## 2021-02-20 MED ORDER — ALBUMIN HUMAN 25 % IV SOLN
INTRAVENOUS | Status: AC
  Administered 2021-02-20: 25 g via INTRAVENOUS
  Filled 2021-02-20: qty 200

## 2021-02-20 NOTE — Procedures (Signed)
PROCEDURE SUMMARY:  Successful US guided paracentesis from left lateral abdomen.  Yielded 10.0 liters of yellow, clear fluid.  No immediate complications.  Pt tolerated well.   Specimen was not sent for labs.  EBL < 88m  KDocia BarrierPA-C 02/20/2021 2:18 PM

## 2021-02-26 ENCOUNTER — Telehealth: Payer: Self-pay

## 2021-02-26 DIAGNOSIS — K7031 Alcoholic cirrhosis of liver with ascites: Secondary | ICD-10-CM

## 2021-02-26 NOTE — Telephone Encounter (Signed)
Spoke with patient to remind her that she is due for repeat labs at this time. No appointment is necessary. Patient is aware that she will need to stop by the lab in the basement today before 5 PM. Pt states that she will try to come by the lab before tomorrow evening, advised if she does not maintain lab work we will have to discontinue diuretics. Patient verbalized understanding and had no concerns at the end of the call.

## 2021-02-26 NOTE — Telephone Encounter (Signed)
-----   Message from Yevette Edwards, RN sent at 02/19/2021  8:18 AM EDT ----- Regarding: Labs Repeat BMET - pt should be taking Lasix 40 mg/day and Aldactone 100 mg/day

## 2021-03-06 DIAGNOSIS — R69 Illness, unspecified: Secondary | ICD-10-CM | POA: Diagnosis not present

## 2021-03-06 DIAGNOSIS — H269 Unspecified cataract: Secondary | ICD-10-CM | POA: Diagnosis not present

## 2021-03-06 DIAGNOSIS — H2511 Age-related nuclear cataract, right eye: Secondary | ICD-10-CM | POA: Diagnosis not present

## 2021-03-06 DIAGNOSIS — H25811 Combined forms of age-related cataract, right eye: Secondary | ICD-10-CM | POA: Diagnosis not present

## 2021-03-06 DIAGNOSIS — K769 Liver disease, unspecified: Secondary | ICD-10-CM | POA: Diagnosis not present

## 2021-03-06 DIAGNOSIS — F17200 Nicotine dependence, unspecified, uncomplicated: Secondary | ICD-10-CM | POA: Diagnosis not present

## 2021-03-13 DIAGNOSIS — Z0100 Encounter for examination of eyes and vision without abnormal findings: Secondary | ICD-10-CM | POA: Diagnosis not present

## 2021-03-19 ENCOUNTER — Telehealth: Payer: Self-pay | Admitting: Gastroenterology

## 2021-03-19 DIAGNOSIS — K7031 Alcoholic cirrhosis of liver with ascites: Secondary | ICD-10-CM

## 2021-03-19 NOTE — Telephone Encounter (Signed)
Inbound call from pt requesting a call back stating that she has fluid build up and she is to the max. Please advise. Thank you.

## 2021-03-19 NOTE — Telephone Encounter (Signed)
Yes agree to all - thanks

## 2021-03-19 NOTE — Telephone Encounter (Signed)
Patient has been scheduled for a paracentesis at [facility] on Wednesday, 03/20/21 at 1 pm. Patient will need to arrive at [facility] by 12:45 pm. Spoke with patient in regards to her appt information, she verbalized understanding and had no concerns at the end of the call.

## 2021-03-19 NOTE — Telephone Encounter (Signed)
Spoke with patient, she is requesting a paracentesis appt. She is not currently on any diuretics. Last paracentesis was on 02/20/21 with 10 L removed. OK to order large volume paracentesis, give albumin if > 5 L removed. Send for cell count?  Please advise, thanks.

## 2021-03-20 ENCOUNTER — Ambulatory Visit (HOSPITAL_COMMUNITY)
Admission: RE | Admit: 2021-03-20 | Discharge: 2021-03-20 | Disposition: A | Discharge status: Home / Self Care | Payer: Medicare HMO | Source: Ambulatory Visit | Attending: Gastroenterology | Admitting: Gastroenterology

## 2021-03-20 ENCOUNTER — Other Ambulatory Visit: Payer: Self-pay

## 2021-03-20 DIAGNOSIS — K7031 Alcoholic cirrhosis of liver with ascites: Secondary | ICD-10-CM | POA: Insufficient documentation

## 2021-03-20 DIAGNOSIS — R188 Other ascites: Secondary | ICD-10-CM | POA: Diagnosis not present

## 2021-03-20 DIAGNOSIS — R69 Illness, unspecified: Secondary | ICD-10-CM | POA: Diagnosis not present

## 2021-03-20 HISTORY — PX: IR PARACENTESIS: IMG2679

## 2021-03-20 LAB — BODY FLUID CELL COUNT WITH DIFFERENTIAL
Eos, Fluid: 2 %
Lymphs, Fluid: 26 %
Monocyte-Macrophage-Serous Fluid: 56 % (ref 50–90)
Neutrophil Count, Fluid: 16 % (ref 0–25)
Total Nucleated Cell Count, Fluid: 22 cu mm (ref 0–1000)

## 2021-03-20 MED ORDER — ALBUMIN HUMAN 25 % IV SOLN
INTRAVENOUS | Status: AC
  Filled 2021-03-20: qty 200

## 2021-03-20 MED ORDER — LIDOCAINE HCL (PF) 1 % IJ SOLN
INTRAMUSCULAR | Status: DC | PRN
  Administered 2021-03-20: 10 mL

## 2021-03-20 MED ORDER — LIDOCAINE HCL 1 % IJ SOLN
INTRAMUSCULAR | Status: AC
  Filled 2021-03-20: qty 20

## 2021-03-20 MED ORDER — ALBUMIN HUMAN 25 % IV SOLN: 75.0000 g | Freq: Once | INTRAVENOUS | Status: AC

## 2021-03-20 MED ORDER — ALBUMIN HUMAN 25 % IV SOLN
INTRAVENOUS | Status: AC
  Administered 2021-03-20: 75 g via INTRAVENOUS
  Filled 2021-03-20: qty 100

## 2021-03-20 NOTE — Procedures (Signed)
Ultrasound-guided diagnostic and therapeutic paracentesis performed yielding 10 liters (maximum ordered) of yellow fluid. No immediate complications.EBL none. A portion of the fluid was sent to the lab for cell count/diff. The pt will receive IV albumin postprocedure(75g).

## 2021-03-22 LAB — PATHOLOGIST SMEAR REVIEW

## 2021-04-04 ENCOUNTER — Other Ambulatory Visit (INDEPENDENT_AMBULATORY_CARE_PROVIDER_SITE_OTHER): Payer: Medicare HMO

## 2021-04-04 ENCOUNTER — Ambulatory Visit (INDEPENDENT_AMBULATORY_CARE_PROVIDER_SITE_OTHER): Payer: Medicare HMO | Admitting: Nurse Practitioner

## 2021-04-04 ENCOUNTER — Encounter: Payer: Self-pay | Admitting: Nurse Practitioner

## 2021-04-04 VITALS — BP 106/70 | HR 99 | Ht 64.0 in | Wt 147.1 lb

## 2021-04-04 DIAGNOSIS — R69 Illness, unspecified: Secondary | ICD-10-CM | POA: Diagnosis not present

## 2021-04-04 DIAGNOSIS — K7031 Alcoholic cirrhosis of liver with ascites: Secondary | ICD-10-CM

## 2021-04-04 LAB — CBC WITH DIFFERENTIAL/PLATELET
Basophils Absolute: 0.1 10*3/uL (ref 0.0–0.1)
Basophils Relative: 2.1 % (ref 0.0–3.0)
Eosinophils Absolute: 0.2 10*3/uL (ref 0.0–0.7)
Eosinophils Relative: 6 % — ABNORMAL HIGH (ref 0.0–5.0)
HCT: 30.4 % — ABNORMAL LOW (ref 36.0–46.0)
Hemoglobin: 9.7 g/dL — ABNORMAL LOW (ref 12.0–15.0)
Lymphocytes Relative: 26.9 % (ref 12.0–46.0)
Lymphs Abs: 0.9 10*3/uL (ref 0.7–4.0)
MCHC: 31.8 g/dL (ref 30.0–36.0)
MCV: 71.6 fl — ABNORMAL LOW (ref 78.0–100.0)
Monocytes Absolute: 0.4 10*3/uL (ref 0.1–1.0)
Monocytes Relative: 10.5 % (ref 3.0–12.0)
Neutro Abs: 1.9 10*3/uL (ref 1.4–7.7)
Neutrophils Relative %: 54.5 % (ref 43.0–77.0)
Platelets: 178 10*3/uL (ref 150.0–400.0)
RBC: 4.24 Mil/uL (ref 3.87–5.11)
RDW: 23.8 % — ABNORMAL HIGH (ref 11.5–15.5)
WBC: 3.5 10*3/uL — ABNORMAL LOW (ref 4.0–10.5)

## 2021-04-04 LAB — PROTIME-INR
INR: 1.3 ratio — ABNORMAL HIGH (ref 0.8–1.0)
Prothrombin Time: 14 s — ABNORMAL HIGH (ref 9.6–13.1)

## 2021-04-04 LAB — COMPREHENSIVE METABOLIC PANEL
ALT: 14 U/L (ref 0–35)
AST: 34 U/L (ref 0–37)
Albumin: 3.4 g/dL — ABNORMAL LOW (ref 3.5–5.2)
Alkaline Phosphatase: 147 U/L — ABNORMAL HIGH (ref 39–117)
BUN: 9 mg/dL (ref 6–23)
CO2: 26 mEq/L (ref 19–32)
Calcium: 9 mg/dL (ref 8.4–10.5)
Chloride: 106 mEq/L (ref 96–112)
Creatinine, Ser: 0.63 mg/dL (ref 0.40–1.20)
GFR: 99.28 mL/min (ref >60.00)
Glucose, Bld: 101 mg/dL — ABNORMAL HIGH (ref 70–99)
Potassium: 4 mEq/L (ref 3.5–5.1)
Sodium: 137 mEq/L (ref 135–145)
Total Bilirubin: 1.2 mg/dL (ref 0.2–1.2)
Total Protein: 7 g/dL (ref 6.0–8.3)

## 2021-04-04 MED ORDER — FUROSEMIDE 20 MG PO TABS: 20.0000 mg | ORAL_TABLET | Freq: Two times a day (BID) | ORAL | 1 refills | Status: DC

## 2021-04-04 MED ORDER — SPIRONOLACTONE 100 MG PO TABS: 100.0000 mg | ORAL_TABLET | Freq: Every day | ORAL | 1 refills | Status: DC

## 2021-04-04 NOTE — Patient Instructions (Addendum)
LABS:  Lab work has been ordered for you today. Our lab is located in the basement. Press "B" on the elevator. The lab is located at the first door on the left as you exit the elevator.  HEALTHCARE LAWS AND MY CHART RESULTS: Due to recent changes in healthcare laws, you may see the results of your imaging and laboratory studies on MyChart before your provider has had a chance to review them.   We understand that in some cases there may be results that are confusing or concerning to you. Not all laboratory results come back in the same time frame and the provider may be waiting for multiple results in order to interpret others.  Please give [place] 48 hours in order for your provider to thoroughly review all the results before contacting the office for clarification of your results.   You will be contacted to schedule the paracentesis once lab results are reviewed. Follow a low sodium diet, no more than 2 grams. No alcohol.  We have refilled your Lasix 20 MG tablet, take 1 tablet twice a day. Spironolactone 100 MG tablet, take 1 tablet once a day.  We have scheduled you a follow up with Dr. Havery Moros on 05/21/21 at 10:10 am.  It was great seeing you today! Thank you for entrusting me with your care and choosing [facility].  Noralyn Pick, CRNP  Low-Sodium Eating Plan Sodium, which is an element that makes up salt, helps you maintain a healthy balance of fluids in your body. Too much sodium can increase your blood pressure and cause fluid and waste to be held in your body. Your health care provider or dietitian may recommend following this plan if you have high blood pressure (hypertension), kidney disease, liver disease, or heart failure. Eating less sodium can help lower your blood pressure, reduce swelling, and protect your heart, liver, and kidneys. What are tips for following this plan? Reading food labels The Nutrition Facts label lists the amount of sodium in one  serving of the food. If you eat more than one serving, you must multiply the listed amount of sodium by the number of servings. Choose foods with less than 140 mg of sodium per serving. Avoid foods with 300 mg of sodium or more per serving. Shopping  Look for lower-sodium products, often labeled as "low-sodium" or "no salt added." Always check the sodium content, even if foods are labeled as "unsalted" or "no salt added." Buy fresh foods. Avoid canned foods and pre-made or frozen meals. Avoid canned, cured, or processed meats. Buy breads that have less than 80 mg of sodium per slice. Cooking  Eat more home-cooked food and less restaurant, buffet, and fast food. Avoid adding salt when cooking. Use salt-free seasonings or herbs instead of table salt or sea salt. Check with your health care provider or pharmacist before using salt substitutes. Cook with plant-based oils, such as canola, sunflower, or olive oil. Meal planning When eating at a restaurant, ask that your food be prepared with less salt or no salt, if possible. Avoid dishes labeled as brined, pickled, cured, smoked, or made with soy sauce, miso, or teriyaki sauce. Avoid foods that contain MSG (monosodium glutamate). MSG is sometimes added to [place] food, bouillon, and some canned foods. Make meals that can be grilled, baked, poached, roasted, or steamed. These are generally made with less sodium. General information Most people on this plan should limit their sodium intake to 1,500-2,000 mg (milligrams) of sodium each day. What  foods should I eat? Fruits Fresh, frozen, or canned fruit. Fruit juice. Vegetables Fresh or frozen vegetables. "No salt added" canned vegetables. "No salt added" tomato sauce and paste. Low-sodium or reduced-sodium tomato and vegetable juice. Grains Low-sodium cereals, including oats, puffed wheat and rice, and shredded wheat. Low-sodium crackers. Unsalted rice. Unsalted pasta. Low-sodium bread.  Whole-grain breads and whole-grain pasta. Meats and other proteins Fresh or frozen (no salt added) meat, poultry, seafood, and fish. Low-sodium canned tuna and salmon. Unsalted nuts. Dried peas, beans, and lentils without added salt. Unsalted canned beans. Eggs. Unsalted nut butters. Dairy Milk. Soy milk. Cheese that is naturally low in sodium, such as ricotta cheese, fresh mozzarella, or Swiss cheese. Low-sodium or reduced-sodium cheese. Cream cheese. Yogurt. Seasonings and condiments Fresh and dried herbs and spices. Salt-free seasonings. Low-sodium mustard and ketchup. Sodium-free salad dressing. Sodium-free light mayonnaise. Fresh or refrigerated horseradish. Lemon juice. Vinegar. Other foods Homemade, reduced-sodium, or low-sodium soups. Unsalted popcorn and pretzels. Low-salt or salt-free chips. The items listed above may not be a complete list of foods and beverages you can eat. Contact a dietitian for more information. What foods should I avoid? Vegetables Sauerkraut, pickled vegetables, and relishes. Olives. [place] fries. Onion rings. Regular canned vegetables (not low-sodium or reduced-sodium). Regular canned tomato sauce and paste (not low-sodium or reduced-sodium). Regular tomato and vegetable juice (not low-sodium or reduced-sodium). Frozen vegetables in sauces. Grains Instant hot cereals. Bread stuffing, pancake, and biscuit mixes. Croutons. Seasoned rice or pasta mixes. Noodle soup cups. Boxed or frozen macaroni and cheese. Regular salted crackers. Self-rising flour. Meats and other proteins Meat or fish that is salted, canned, smoked, spiced, or pickled. Precooked or cured meat, such as sausages or meat loaves. Berniece Salines. Ham. Pepperoni. Hot dogs. Corned beef. Chipped beef. Salt pork. Jerky. Pickled herring. Anchovies and sardines. Regular canned tuna. Salted nuts. Dairy Processed cheese and cheese spreads. Hard cheeses. Cheese curds. Blue cheese. Feta cheese. String cheese. Regular  cottage cheese. Buttermilk. Canned milk. Fats and oils Salted butter. Regular margarine. Ghee. Bacon fat. Seasonings and condiments Onion salt, garlic salt, seasoned salt, table salt, and sea salt. Canned and packaged gravies. Worcestershire sauce. Tartar sauce. Barbecue sauce. Teriyaki sauce. Soy sauce, including reduced-sodium. Steak sauce. Fish sauce. Oyster sauce. Cocktail sauce. Horseradish that you find on the shelf. Regular ketchup and mustard. Meat flavorings and tenderizers. Bouillon cubes. Hot sauce. Pre-made or packaged marinades. Pre-made or packaged taco seasonings. Relishes. Regular salad dressings. Salsa. Other foods Salted popcorn and pretzels. Corn chips and puffs. Potato and tortilla chips. Canned or dried soups. Pizza. Frozen entrees and pot pies. The items listed above may not be a complete list of foods and beverages you should avoid. Contact a dietitian for more information. Summary Eating less sodium can help lower your blood pressure, reduce swelling, and protect your heart, liver, and kidneys. Most people on this plan should limit their sodium intake to 1,500-2,000 mg (milligrams) of sodium each day. Canned, boxed, and frozen foods are high in sodium. Restaurant foods, fast foods, and pizza are also very high in sodium. You also get sodium by adding salt to food. Try to cook at home, eat more fresh fruits and vegetables, and eat less fast food and canned, processed, or prepared foods. This information is not intended to replace advice given to you by your health care provider. Make sure you discuss any questions you have with your health care provider. Document Revised: 07/15/2019 Document Reviewed: 05/11/2019 Elsevier Patient Education  2022 [place]  GI providers would like to encourage you to use [facility] to communicate with providers for non-urgent requests or questions.  Due to long hold times on the telephone, sending your provider a message by  [facility] may be faster and more efficient way to get a response. Please allow 48 business hours for a response.  Please remember that this is for non-urgent requests/questions. If you are age 27 or older, your body mass index should be between 23-30. Your Body mass index is 25.25 kg/m. If this is out of the aforementioned range listed, please consider follow up with your Primary Care Provider.  If you are age 57 or younger, your body mass index should be between 19-25. Your Body mass index is 25.25 kg/m. If this is out of the aformentioned range listed, please consider follow up with your Primary Care Provider.

## 2021-04-04 NOTE — Progress Notes (Signed)
04/04/2021 Ai Sonnenfeld [id number] 1965/10/14   Chief Complaint: Cirrhosis follow up   History of Present Illness: Katie Holland is a 55 year old female with a past medical history of alcohol associated decompensated cirrhosis with ascites requiring a paracentesis 1 to 2 times monthly, portal hypertension, UGI bleed secondary to esophageal varices s/p numerous banding and hepatic encephalopathy.  She is followed by Dr. Havery Moros. She ran out of her Lasix RX about 2 months ago so she's been off of it since then. She reports taking Aldactone 149m QD and potassium QD. She is following a low sodium diet. She complains of abdominal distension which has increased since her last paracentesis which was done on 03/20/2021 (10L of peritoneal fluid was removed the administration of IV albumin).  No evidence of SBP.  Her leg swelling has decreased.  She is not on nonselective beta-blocker with concerns this therapy may worsen her ascites.  She denies having any nausea or vomiting. No upper or lower abdominal pain. She is passing a normal formed brown stool most days. No rectal bleeding or black stools. Urine color is intermittently dark. No confusion. She reports being abstinent from alcohol for 1 year, cannot recall the exact date of last alcohol consumption. Her most recent surveillance sonogram was 12/27/2020 which was consistent with cirrhosis, ascites without hepatoma.  Her most recent EGD was 08/07/2020 which identified scarring to the esophagus from prior banding without evidence of further esophageal varices, antral gastritis with an erosion and a single duodenal polyp was resected.  No recent issues with hepatic encephalopathy.  She is not on Lactulose or Xifaxan.  Laboratory studies 02/08/2021: Sodium 137.  Potassium 3.6.  Glucose 86.  BUN 15.  Creatinine 0.62.  Alk phos 105.  Albumin 3.2.  AST 26.  ALT 15.  Ammonia 110.  Total bili 1.8.  WBC 2.8.  Hemoglobin 9.6.  Hematocrit 30.2.  Platelet 158. INR 1.2 on  12/07/2020.  RUQ sono 12/27/2020: 1. Cirrhosis without evidence of focal hepatic lesion. 2. Moderate to large volume abdominopelvic ascites.  AFP 1.8 on 12/07/2020  EGD 08/07/2020: - Esophagogastric landmarks identified. - Normal esophagus otherwise - scarring from prior banding noted but appreciable varices. - Antral gastritis with an erosion. - Normal stomach otherwise - biopsies taken to rule out H pylori - A single duodenal polyp. Resected and retrieved. - Normal duodenum otherwise -Surveillance EGD in 1 year  EGD 08/01/19: There was extensive scarring in the distal esophagus secondary to prior banding, but no obvious varices noted today. The exam of the esophagus was otherwise normal. Diffuse severely congested mucosa was found in the gastric antrum secondary to portal hypertension.. The exam of the stomach was otherwise normal. No gastric varices. The duodenal bulb and second portion of the duodenum were normal.  EGD 07/07/18 - 4 bands placed EGD 07/27/18 - 3 bands placed EGD 08/30/18 - 3 bands placed  EGD 11/08/18 -no varices amenable to banding, portal hypertensive gastritis EGD 01/31/19 - portal hypertensive gastritis, no varices    Colonoscopy 08/01/19 - Preparation of the colon was fair - most of the colon was well visualized - a small portion of the transverse colon, and then the splenic flexure / proximal descending colon had some residual stool that could not be cleared but nothing obvious noted in these areas. - Hemorrhoids found on perianal exam. - The examined portion of the ileum was normal. - Diffuse colonic congestion / edema in the entire examined colon from portal hypertension. - Internal hemorrhoids. -  The examination was otherwise normal.   Current Outpatient Medications on File Prior to Visit  Medication Sig Dispense Refill   potassium chloride SA (KLOR-CON) 20 MEQ tablet Take 2 tablets (40 mEq total) by mouth daily. 10 tablet 0   Current Facility-Administered  Medications on File Prior to Visit  Medication Dose Route Frequency Provider Last Rate Last Admin   lactated ringers infusion    Continuous PRN Leonor Liv, CRNA   New Bag at 03/09/19 0800   lactated ringers infusion    Continuous PRN Leonor Liv, CRNA   New Bag at 03/09/19 1610   Allergies  Allergen Reactions   Contrast Media [Iodinated Diagnostic Agents] Itching and Swelling    Needs 13-hr prep   Latex Itching, Swelling and Other (See Comments)    No breathing impairment, however   Sulfa Antibiotics Itching, Swelling and Other (See Comments)    No breathing impairment, however   Current Medications, Allergies, Past Medical History, Past Surgical History, Family History and Social History were reviewed in [place] record.  Review of Systems:   Constitutional: Negative for fever, sweats, chills or weight loss.  Respiratory: Negative for shortness of breath.   Cardiovascular: Negative for chest pain, palpitations and leg swelling.  Gastrointestinal: See HPI.  Musculoskeletal: Negative for back pain or muscle aches.  Neurological: Negative for dizziness, headaches or paresthesias.  No confusion.  Physical Exam: BP 106/70   Pulse 99   Ht _0  (1.626 m)   Wt 147 lb 2 oz (66.7 kg)   BMI 25.25 kg/m  General: 55 year old female in NAD.  Head: Normocephalic and atraumatic. Eyes: No scleral icterus. Conjunctiva pink . Ears: Normal auditory acuity. Mouth: Poor dentition.  No ulcers or lesions.  Lungs: Clear throughout to auscultation. Heart: Regular rate and rhythm, no murmur. Abdomen: Soft. Distended with ascites but not tense. Nontender. Dull to percussion.  No masses or hepatomegaly. Normal bowel sounds x 4 quadrants. Umbilical hernia present.  Rectal: Deferred.  Musculoskeletal: Symmetrical with no gross deformities. Extremities: Trace lower extremity edema. Neurological: Alert oriented x 4. No focal deficits. No asterixis.  Psychological: Alert and  cooperative. Normal mood and affect  Assessment and Recommendations:  18) 55 year old female with alcohol associated decompensated cirrhosis with ascites, esophageal varices and prior history of hepatic encephalopathy.  She is noncompliant with her diuretic regimen, she ran out of Furosemide 2 months ago without contacting our office for refill.  She remains on Spironolactone 100 mg daily.  She maintains a low-sodium diet.  She is abstinent from alcohol for more than 6 months.  No events of overt hepatic encephalopathy at this time. -TIPS evaluation deferred until she shows compliance with diuretic regimen -CBC, CMP and INR.  Calculate current MELD score after lab results received. -Next AFP level due 05/2021 -Paracentesis to remove no more than 9 L peritoneal fluid with administration of 50 g of IV albumin, rescheduled after the above lab results reviewed. -Furosemide 20 mg 1 p.o. twice daily -Spironolactone 100 mg p.o. every morning -Oral  KCl RX instructions to be determined after laboratory studies reviewed -Next surveillance RUQ sonogram due 06/2021 -Surveillance EGD due 07/2021 -Eventual liver transplant evaluation if she remains abstinent from alcohol and compliant with diuretic regimen -Follow-up in the office in 6 weeks, recommend closer follow-up  2) Chronic microcytic anemia. No overt upper or lower GI bleeding at this time.  No splenomegaly per abdominal/pelvic CTA 12/2018.  3) Colon cancer screening. Last colonoscopy 08/01/2019, fair  prep, no polyps -Dr. Havery Moros to verify colonoscopy recall date

## 2021-04-05 ENCOUNTER — Telehealth: Payer: Self-pay | Admitting: Nurse Practitioner

## 2021-04-05 NOTE — Telephone Encounter (Signed)
Beth, please contact the patient and schedule her for a therapeutic paracentesis.  Her labs 04/04/2021 were stable.  Max amount of peritoneal fluid to be removed 9 L.  Patient to receive IV albumin 25 grams if 5L or less removed or 50 grams  of IV albumin if more than 5 L removed.  Peritoneal fluid labs to include cell count with differential, Gram stain, aerobic and anaerobic culture.  Thank you

## 2021-04-06 NOTE — Progress Notes (Signed)
Agree with assessment and plan as outlined.  

## 2021-04-08 ENCOUNTER — Other Ambulatory Visit: Payer: Self-pay

## 2021-04-08 DIAGNOSIS — K7031 Alcoholic cirrhosis of liver with ascites: Secondary | ICD-10-CM

## 2021-04-09 ENCOUNTER — Telehealth: Payer: Self-pay

## 2021-04-09 NOTE — Telephone Encounter (Signed)
Katie Holland,  The orders needed to be signed. I asked Dr Havery Moros for help with this. The patient will have an IR paracentesis on 04/12/21.  I found the information about her need for repeat labs as well on 04/11/21 and have arranged this. I will call the patient on 04/11/21 to remind her to come get this done since I have called her today on 04/09/21.

## 2021-04-09 NOTE — Telephone Encounter (Signed)
Spoke with the patient. She will plan for her labs to be drawn on 04/11/21, and her IR paracentesis will be on 04/12/21 at 1:00 pm in [facility].

## 2021-04-09 NOTE — Telephone Encounter (Signed)
-----   Message from Yetta Flock, MD sent at 04/08/2021  1:25 PM EDT ----- Katie Holland. Yes okay to order with the following recommendations: - can take up to 10 L - if < 5 L removed I don't think she needs any albumin - if > 5 L removed, IR will give albumin per their protocol. They often will do the dosing.  - only needs cell count with diff, nothing else. Thanks   ----- Message ----- From: Greggory Keen, LPN Sent: [id number]  10:17 AM EDT To: Yetta Flock, MD  Colleen saw this patient. She ordered a paracentesis for her last week. I was out of the office.  I was wanting to take care of this today.  Jaclyn Shaggy is out of the office all week and will not be in Epic. I need the orders to be signed.  May I order this in your name?   "Max amt to be removed is 9 liters Albumin 25 grams is 5L or less is removed and 50 grams albumin is >5L is removed. Also Cell count with diff, Gram stain, aerobic and anaerobic cultures."  Thank you

## 2021-04-10 ENCOUNTER — Other Ambulatory Visit: Payer: Self-pay | Admitting: Nurse Practitioner

## 2021-04-11 ENCOUNTER — Other Ambulatory Visit: Payer: Self-pay

## 2021-04-11 DIAGNOSIS — K7031 Alcoholic cirrhosis of liver with ascites: Secondary | ICD-10-CM

## 2021-04-11 NOTE — Telephone Encounter (Signed)
Dr Havery Moros The patient's transportation fell through for her today. She will be coming for her labs tomorrow. She is unable to get transportation to go to 2 places in the same day. Therefore, her paracentesis is next week 04/18/21.

## 2021-04-11 NOTE — Telephone Encounter (Signed)
Patient is not able to come for her labs and also go to the paracentesis appt on Friday. She requested to reschedule the appt at [facility].

## 2021-04-12 ENCOUNTER — Ambulatory Visit (HOSPITAL_COMMUNITY): Payer: Medicare HMO

## 2021-04-12 ENCOUNTER — Other Ambulatory Visit (INDEPENDENT_AMBULATORY_CARE_PROVIDER_SITE_OTHER): Payer: Medicare HMO

## 2021-04-12 DIAGNOSIS — K7031 Alcoholic cirrhosis of liver with ascites: Secondary | ICD-10-CM | POA: Diagnosis not present

## 2021-04-12 DIAGNOSIS — R69 Illness, unspecified: Secondary | ICD-10-CM | POA: Diagnosis not present

## 2021-04-12 LAB — BASIC METABOLIC PANEL
BUN: 10 mg/dL (ref 6–23)
CO2: 25 mEq/L (ref 19–32)
Calcium: 8.8 mg/dL (ref 8.4–10.5)
Chloride: 106 mEq/L (ref 96–112)
Creatinine, Ser: 0.65 mg/dL (ref 0.40–1.20)
GFR: 98.52 mL/min (ref >60.00)
Glucose, Bld: 98 mg/dL (ref 70–99)
Potassium: 3.7 mEq/L (ref 3.5–5.1)
Sodium: 138 mEq/L (ref 135–145)

## 2021-04-15 ENCOUNTER — Other Ambulatory Visit: Payer: Self-pay

## 2021-04-15 DIAGNOSIS — K7031 Alcoholic cirrhosis of liver with ascites: Secondary | ICD-10-CM

## 2021-04-18 ENCOUNTER — Ambulatory Visit (HOSPITAL_COMMUNITY)
Admission: RE | Admit: 2021-04-18 | Discharge: 2021-04-18 | Disposition: A | Discharge status: Home / Self Care | Payer: Medicare HMO | Source: Ambulatory Visit | Attending: Gastroenterology | Admitting: Gastroenterology

## 2021-04-18 ENCOUNTER — Other Ambulatory Visit: Payer: Self-pay

## 2021-04-18 DIAGNOSIS — K7031 Alcoholic cirrhosis of liver with ascites: Secondary | ICD-10-CM | POA: Diagnosis present

## 2021-04-18 DIAGNOSIS — R69 Illness, unspecified: Secondary | ICD-10-CM | POA: Diagnosis not present

## 2021-04-18 DIAGNOSIS — R188 Other ascites: Secondary | ICD-10-CM | POA: Diagnosis not present

## 2021-04-18 HISTORY — PX: IR PARACENTESIS: IMG2679

## 2021-04-18 LAB — BODY FLUID CELL COUNT WITH DIFFERENTIAL
Eos, Fluid: 0 %
Lymphs, Fluid: 24 %
Monocyte-Macrophage-Serous Fluid: 10 % — ABNORMAL LOW (ref 50–90)
Neutrophil Count, Fluid: 66 % — ABNORMAL HIGH (ref 0–25)
Total Nucleated Cell Count, Fluid: 51 cu mm (ref 0–1000)

## 2021-04-18 MED ORDER — LIDOCAINE HCL (PF) 1 % IJ SOLN
INTRAMUSCULAR | Status: DC | PRN
  Administered 2021-04-18: 10 mL

## 2021-04-18 MED ORDER — ALBUMIN HUMAN 25 % IV SOLN
INTRAVENOUS | Status: AC
  Filled 2021-04-18: qty 300

## 2021-04-18 MED ORDER — ALBUMIN HUMAN 25 % IV SOLN
75.0000 g | Freq: Once | INTRAVENOUS | Status: AC
  Administered 2021-04-18: 75 g via INTRAVENOUS
  Filled 2021-04-18: qty 300

## 2021-04-18 MED ORDER — LIDOCAINE HCL 1 % IJ SOLN
INTRAMUSCULAR | Status: AC
  Filled 2021-04-18: qty 20

## 2021-04-18 NOTE — Procedures (Signed)
PROCEDURE SUMMARY:  Successful US guided paracentesis from right abdomen.  Yielded 8.7 L of clear yellow fluid.  No immediate complications.  Pt tolerated well.   Specimen sent for labs.  EBL < 2 mL  Theresa Duty, NP 04/18/2021 2:08 PM

## 2021-04-19 LAB — PATHOLOGIST SMEAR REVIEW

## 2021-04-25 ENCOUNTER — Ambulatory Visit: Payer: Medicare HMO | Admitting: Gastroenterology

## 2021-05-08 ENCOUNTER — Other Ambulatory Visit: Payer: Self-pay

## 2021-05-08 ENCOUNTER — Telehealth: Payer: Self-pay | Admitting: Nurse Practitioner

## 2021-05-08 DIAGNOSIS — K7031 Alcoholic cirrhosis of liver with ascites: Secondary | ICD-10-CM

## 2021-05-08 NOTE — Telephone Encounter (Signed)
Spoke with the patient. She will be coming for her Bmet on Friday 05/10/21.  Is it okay to order the paracentesis for next week.  She has not had any changes in her medications. States fluid is making her uncomfortable now. She "feels full."

## 2021-05-08 NOTE — Telephone Encounter (Signed)
Thanks [employer]. Will await her labs. If she wants to do paracentesis sooner and is uncomfortable can order it sooner. Thanks

## 2021-05-08 NOTE — Telephone Encounter (Signed)
Patient called needing to schedule a paracentesis, says she is full.

## 2021-05-08 NOTE — Telephone Encounter (Signed)
If you feel it is feasible, the paracentesis could be ordered for the Monday or Tuesday following her labs. The plan would be to cancel or modify the orders if her labs were off. If we wait to order after receiving the labs, it will further delay the procedure due to scheduling. Patient does indicate that she is uncomfortable. Is this okay?

## 2021-05-08 NOTE — Telephone Encounter (Signed)
That's okay I would go ahead and coordinate paracentesis. Can draw labs same day if needed. Thanks

## 2021-05-09 NOTE — Telephone Encounter (Signed)
Paracentesis will be at [facility] on 05/13/21 at 9:00 am.  Patient agrees to arrive a t8:45 am. She will hold her diuretics that morning. Patient also confirms she has a ride to come to [employer] for her labs today or tomorrow.

## 2021-05-10 ENCOUNTER — Other Ambulatory Visit (INDEPENDENT_AMBULATORY_CARE_PROVIDER_SITE_OTHER): Payer: Medicare HMO

## 2021-05-10 DIAGNOSIS — R69 Illness, unspecified: Secondary | ICD-10-CM | POA: Diagnosis not present

## 2021-05-10 DIAGNOSIS — K7031 Alcoholic cirrhosis of liver with ascites: Secondary | ICD-10-CM | POA: Diagnosis not present

## 2021-05-10 LAB — BASIC METABOLIC PANEL
BUN: 11 mg/dL (ref 6–23)
CO2: 25 mEq/L (ref 19–32)
Calcium: 8.8 mg/dL (ref 8.4–10.5)
Chloride: 109 mEq/L (ref 96–112)
Creatinine, Ser: 0.61 mg/dL (ref 0.40–1.20)
GFR: 99.99 mL/min (ref >60.00)
Glucose, Bld: 97 mg/dL (ref 70–99)
Potassium: 3.8 mEq/L (ref 3.5–5.1)
Sodium: 141 mEq/L (ref 135–145)

## 2021-05-13 ENCOUNTER — Ambulatory Visit (HOSPITAL_COMMUNITY): Payer: Medicare HMO

## 2021-05-14 ENCOUNTER — Other Ambulatory Visit: Payer: Self-pay

## 2021-05-14 ENCOUNTER — Ambulatory Visit (HOSPITAL_COMMUNITY)
Admission: RE | Admit: 2021-05-14 | Discharge: 2021-05-14 | Disposition: A | Discharge status: Home / Self Care | Payer: Medicare HMO | Source: Ambulatory Visit | Attending: Gastroenterology | Admitting: Gastroenterology

## 2021-05-14 DIAGNOSIS — K7031 Alcoholic cirrhosis of liver with ascites: Secondary | ICD-10-CM | POA: Diagnosis not present

## 2021-05-14 DIAGNOSIS — R69 Illness, unspecified: Secondary | ICD-10-CM | POA: Diagnosis not present

## 2021-05-14 HISTORY — PX: IR PARACENTESIS: IMG2679

## 2021-05-14 LAB — BODY FLUID CELL COUNT WITH DIFFERENTIAL
Eos, Fluid: 0 %
Lymphs, Fluid: 29 %
Monocyte-Macrophage-Serous Fluid: 6 % — ABNORMAL LOW (ref 50–90)
Neutrophil Count, Fluid: 65 % — ABNORMAL HIGH (ref 0–25)
Total Nucleated Cell Count, Fluid: 73 cu mm (ref 0–1000)

## 2021-05-14 MED ORDER — ALBUMIN HUMAN 25 % IV SOLN
INTRAVENOUS | Status: AC
  Administered 2021-05-14: 75 g via INTRAVENOUS
  Filled 2021-05-14: qty 300

## 2021-05-14 MED ORDER — ALBUMIN HUMAN 25 % IV SOLN
75.0000 g | Freq: Once | INTRAVENOUS | Status: AC
  Filled 2021-05-14: qty 300

## 2021-05-14 MED ORDER — LIDOCAINE HCL 1 % IJ SOLN
INTRAMUSCULAR | Status: AC
  Filled 2021-05-14: qty 20

## 2021-05-14 MED ORDER — LIDOCAINE HCL 1 % IJ SOLN
INTRAMUSCULAR | Status: DC | PRN
  Administered 2021-05-14: 10 mL via INTRADERMAL

## 2021-05-14 NOTE — Procedures (Signed)
Ultrasound-guided diagnostic and therapeutic paracentesis performed yielding 9 liters of straw colored fluid.  Fluid was sent to lab for analysis. No immediate complications. EBL is none.

## 2021-05-15 LAB — PATHOLOGIST SMEAR REVIEW

## 2021-05-21 ENCOUNTER — Encounter: Payer: Self-pay | Admitting: Gastroenterology

## 2021-05-21 ENCOUNTER — Ambulatory Visit (INDEPENDENT_AMBULATORY_CARE_PROVIDER_SITE_OTHER): Payer: Medicare HMO | Admitting: Gastroenterology

## 2021-05-21 VITALS — BP 104/70 | HR 103 | Ht 64.0 in | Wt 143.1 lb

## 2021-05-21 DIAGNOSIS — K7031 Alcoholic cirrhosis of liver with ascites: Secondary | ICD-10-CM | POA: Diagnosis not present

## 2021-05-21 DIAGNOSIS — R69 Illness, unspecified: Secondary | ICD-10-CM | POA: Diagnosis not present

## 2021-05-21 DIAGNOSIS — R609 Edema, unspecified: Secondary | ICD-10-CM

## 2021-05-21 DIAGNOSIS — I8500 Esophageal varices without bleeding: Secondary | ICD-10-CM

## 2021-05-21 MED ORDER — SPIRONOLACTONE 100 MG PO TABS: 100.0000 mg | ORAL_TABLET | Freq: Every day | ORAL | 1 refills | Status: DC

## 2021-05-21 MED ORDER — FUROSEMIDE 20 MG PO TABS: 20.0000 mg | ORAL_TABLET | Freq: Two times a day (BID) | ORAL | 3 refills | Status: DC

## 2021-05-21 NOTE — Patient Instructions (Addendum)
If you are age 56 or older, your body mass index should be between 23-30. Your Body mass index is 24.57 kg/m. If this is out of the aforementioned range listed, please consider follow up with your Primary Care Provider.  If you are age 70 or younger, your body mass index should be between 19-25. Your Body mass index is 24.57 kg/m. If this is out of the aformentioned range listed, please consider follow up with your Primary Care Provider.   ________________________________________________________  The [place] [facility] providers would like to encourage you to use [facility] to communicate with providers for non-urgent requests or questions.  Due to long hold times on the telephone, sending your provider a message by [facility] may be a faster and more efficient way to get a response.  Please allow 48 business hours for a response.  Please remember that this is for non-urgent requests.  _______________________________________________________  Katie Holland have been scheduled for an abdominal ultrasound at [facility] Radiology (1st floor of hospital) on Monday, 07-01-21 at 9:00am. Please arrive 15 minutes prior to your appointment for registration. Make certain not to have anything to eat or drink 6 hours prior to your appointment. Should you need to reschedule your appointment, please contact radiology at [phone]. This test typically takes about 30 minutes to perform.  We have sent the following medications to your pharmacy for you to pick up at your convenience: Lasix (furosemide)  20 mg: Take TWICE a day Aldactone (spironolactone): Take once daily  PLEASE GO TO THE LAB ONE DAY NEXT WEEK for blood work.  You have been scheduled for an endoscopy at [facility] on Thursday, March 2nd at 7:30 am. Please follow written instructions given to you at your visit today. If you use inhalers (even only as needed), please bring them with you on the day of your procedure.   Please follow up in 6 months.  Thank you  for entrusting me with your care and for choosing [facility], Dr. La Sal Cellar

## 2021-05-21 NOTE — Progress Notes (Signed)
HPI :  55 y/o female here for a follow up visit for decompensated alcoholic cirrhosis, accompanied by her husband today.  She has a history of significant esophageal varices leading to bleeding in the past.  She has had a few admissions at the end of 2019 and required over 25 bands to eradicate varices.  Her course is also been complicated by significant ascites.  Ascites has been her main issue in general over the past few years.  She had continued to drink alcohol for some time, states she is doing a good job of abstinence since have last seen her although husband questioned if she was drinking.  Patient states she really has not been having any alcohol at all.  She states she is compliant with a low-sodium diet.  She has been on Lasix 20 mg twice daily and Aldactone 100 mg daily, we have been checking her renal function which has been normal.  Today she tells me she has not been taking this for the past 2 months.  Historically she has had a very hard time being compliant with her diuretics, she states she does not like taking them because they make her go to the bathroom too frequently.  She has been managed with large-volume paracentesis about 2 times per month which she tolerates but is frustrated by continued recurrence of fluid. She has not been on nonselective beta-blockade due to difficulty managing her ascites.   Aside of the volume status issues she has been doing okay for the most part.  She does endorse some slight shortness of breath from the ascites.  She is eating okay.  States weight is stable.  She is due for an ultrasound in January and a follow-up EGD in March.  She has had some ongoing anemia with some microcytosis recently.  She is not taking any iron or B12 supplements and has deficiency in those in the past.   Most recent endoscopies: EGD 07/07/18 - 4 bands placed EGD 07/27/18 - 3 bands placed EGD 08/30/18 - 3 bands placed  EGD 11/08/18 -no varices amenable to banding, portal  hypertensive gastritis EGD 01/31/19 - portal hypertensive gastritis, no varices   EGD 08/01/19 - There was extensive scarring in the distal esophagus secondary to prior banding, but no obvious varices noted today. The exam of the esophagus was otherwise normal. Diffuse severely congested mucosa was found in the gastric antrum secondary to portal hypertension.. The exam of the stomach was otherwise normal. No gastric varices. The duodenal bulb and second portion of the duodenum were normal.    Colonoscopy 08/01/19 - Preparation of the colon was fair - most of the colon was well visualized - a small portion of the transverse colon, and then the splenic flexure / proximal descending colon had some residual stool that could not be cleared but nothing obvious noted in these areas. - Hemorrhoids found on perianal exam. - The examined portion of the ileum was normal. - Diffuse colonic congestion / edema in the entire examined colon from portal hypertension. - Internal hemorrhoids. - The examination was otherwise normal.     EGD 08/07/20: Esophagogastric landmarks identified. - Normal esophagus otherwise - scarring from prior banding noted but appreciable varices. - Antral gastritis with an erosion. - Normal stomach otherwise - biopsies taken to rule out H pylori - A single duodenal polyp. Resected and retrieved. - Normal duodenum otherwise     RUQ [place] 12/27/20 - IMPRESSION: 1. Cirrhosis without evidence of focal hepatic lesion. 2.  Moderate to large volume abdominopelvic ascites   Past Medical History:  Diagnosis Date   Alcoholic liver disease (HCC)    Chronic back pain    on disability   Cirrhosis of liver (HCC)    Esophageal varices (HCC)    GERD (gastroesophageal reflux disease)    Headache    Jaundice      Past Surgical History:  Procedure Laterality Date   BACK SURGERY     L3-L4   BIOPSY  08/07/2020   Procedure: BIOPSY;  Surgeon: Yetta Flock, MD;  Location: [facility] ENDOSCOPY;   Service: Gastroenterology;;   COLONOSCOPY WITH PROPOFOL N/A 08/01/2019   Procedure: COLONOSCOPY WITH PROPOFOL;  Surgeon: Yetta Flock, MD;  Location: [facility];  Service: Gastroenterology;  Laterality: N/A;   ESOPHAGEAL BANDING  04/25/2018   Procedure: ESOPHAGEAL BANDING;  Surgeon: Yetta Flock, MD;  Location: [facility] ENDOSCOPY;  Service: Gastroenterology;;   ESOPHAGEAL BANDING  04/27/2018   Procedure: ESOPHAGEAL BANDING;  Surgeon: Milus Banister, MD;  Location: [place];  Service: Endoscopy;;   ESOPHAGEAL BANDING  06/02/2018   Procedure: ESOPHAGEAL BANDING;  Surgeon: Doran Stabler, MD;  Location: [place];  Service: Gastroenterology;;   ESOPHAGEAL BANDING N/A 07/07/2018   Procedure: ESOPHAGEAL BANDING;  Surgeon: Yetta Flock, MD;  Location: [facility];  Service: Gastroenterology;  Laterality: N/A;   ESOPHAGEAL BANDING  07/27/2018   Procedure: ESOPHAGEAL BANDING;  Surgeon: Yetta Flock, MD;  Location: [facility];  Service: Gastroenterology;;   ESOPHAGEAL BANDING  08/30/2018   Procedure: ESOPHAGEAL BANDING;  Surgeon: Yetta Flock, MD;  Location: [facility];  Service: Gastroenterology;;   ESOPHAGOGASTRODUODENOSCOPY N/A 04/27/2018   Procedure: ESOPHAGOGASTRODUODENOSCOPY (EGD);  Surgeon: Milus Banister, MD;  Location: [facility] ENDOSCOPY;  Service: Endoscopy;  Laterality: N/A;   ESOPHAGOGASTRODUODENOSCOPY (EGD) WITH PROPOFOL N/A 04/25/2018   Procedure: ESOPHAGOGASTRODUODENOSCOPY (EGD) WITH PROPOFOL;  Surgeon: Yetta Flock, MD;  Location: [place];  Service: Gastroenterology;  Laterality: N/A;   ESOPHAGOGASTRODUODENOSCOPY (EGD) WITH PROPOFOL N/A 06/02/2018   Procedure: ESOPHAGOGASTRODUODENOSCOPY (EGD) WITH PROPOFOL;  Surgeon: Doran Stabler, MD;  Location: [place];  Service: Gastroenterology;  Laterality: N/A;   ESOPHAGOGASTRODUODENOSCOPY (EGD) WITH PROPOFOL N/A 07/07/2018   Procedure: ESOPHAGOGASTRODUODENOSCOPY (EGD) WITH PROPOFOL;   Surgeon: Yetta Flock, MD;  Location: [facility];  Service: Gastroenterology;  Laterality: N/A;   ESOPHAGOGASTRODUODENOSCOPY (EGD) WITH PROPOFOL N/A 07/27/2018   Procedure: ESOPHAGOGASTRODUODENOSCOPY (EGD) WITH PROPOFOL;  Surgeon: Yetta Flock, MD;  Location: [facility];  Service: Gastroenterology;  Laterality: N/A;   ESOPHAGOGASTRODUODENOSCOPY (EGD) WITH PROPOFOL N/A 08/30/2018   Procedure: ESOPHAGOGASTRODUODENOSCOPY (EGD) WITH PROPOFOL;  Surgeon: Yetta Flock, MD;  Location: [facility];  Service: Gastroenterology;  Laterality: N/A;   ESOPHAGOGASTRODUODENOSCOPY (EGD) WITH PROPOFOL N/A 11/08/2018   Procedure: ESOPHAGOGASTRODUODENOSCOPY (EGD) WITH PROPOFOL;  Surgeon: Yetta Flock, MD;  Location: [facility];  Service: Gastroenterology;  Laterality: N/A;   ESOPHAGOGASTRODUODENOSCOPY (EGD) WITH PROPOFOL N/A 01/31/2019   Procedure: ESOPHAGOGASTRODUODENOSCOPY (EGD) WITH PROPOFOL;  Surgeon: Yetta Flock, MD;  Location: [facility];  Service: Gastroenterology;  Laterality: N/A;   ESOPHAGOGASTRODUODENOSCOPY (EGD) WITH PROPOFOL N/A 08/01/2019   Procedure: ESOPHAGOGASTRODUODENOSCOPY (EGD) WITH PROPOFOL;  Surgeon: Yetta Flock, MD;  Location: [facility];  Service: Gastroenterology;  Laterality: N/A;   ESOPHAGOGASTRODUODENOSCOPY (EGD) WITH PROPOFOL N/A 08/07/2020   Procedure: ESOPHAGOGASTRODUODENOSCOPY (EGD) WITH PROPOFOL;  Surgeon: Yetta Flock, MD;  Location: [facility];  Service: Gastroenterology;  Laterality: N/A;   IR PARACENTESIS  04/26/2018   IR PARACENTESIS  05/18/2018  IR PARACENTESIS  05/31/2018   IR PARACENTESIS  06/30/2018   IR PARACENTESIS  08/05/2018   IR PARACENTESIS  08/31/2018   IR PARACENTESIS  10/08/2018   IR PARACENTESIS  10/28/2018   IR PARACENTESIS  02/14/2019   IR PARACENTESIS  03/09/2019   IR PARACENTESIS  04/05/2019   IR PARACENTESIS  05/03/2019   IR PARACENTESIS  06/20/2019   IR PARACENTESIS  07/19/2019   IR PARACENTESIS   08/19/2019   IR PARACENTESIS  09/27/2019   IR PARACENTESIS  10/07/2019   IR PARACENTESIS  10/28/2019   IR PARACENTESIS  11/16/2019   IR PARACENTESIS  12/30/2019   IR PARACENTESIS  01/16/2020   IR PARACENTESIS  02/07/2020   IR PARACENTESIS  03/06/2020   IR PARACENTESIS  04/04/2020   IR PARACENTESIS  04/17/2020   IR PARACENTESIS  05/04/2020   IR PARACENTESIS  06/07/2020   IR PARACENTESIS  06/28/2020   IR PARACENTESIS  07/27/2020   IR PARACENTESIS  08/20/2020   IR PARACENTESIS  09/13/2020   IR PARACENTESIS  10/04/2020   IR PARACENTESIS  10/25/2020   IR PARACENTESIS  11/07/2020   IR PARACENTESIS  11/30/2020   IR PARACENTESIS  12/13/2020   IR PARACENTESIS  01/04/2021   IR PARACENTESIS  01/25/2021   IR PARACENTESIS  02/20/2021   IR PARACENTESIS  03/20/2021   IR PARACENTESIS  04/18/2021   IR PARACENTESIS  05/14/2021   IR RADIOLOGIST EVAL & MGMT  12/23/2018   IR THORACENTESIS ASP PLEURAL SPACE W/IMG GUIDE  12/10/2018   IR THORACENTESIS ASP PLEURAL SPACE W/IMG GUIDE  01/21/2019   Family History  Problem Relation Age of Onset   Colon cancer Neg Hx    Esophageal cancer Neg Hx    Pancreatic cancer Neg Hx    Stomach cancer Neg Hx    Social History   Tobacco Use   Smoking status: Some Days    Packs/day: 0.25    Years: 20.00    Pack years: 5.00    Types: Cigarettes   Smokeless tobacco: Never   Tobacco comments:    5 a day  Vaping Use   Vaping Use: Never used  Substance Use Topics   Alcohol use: Not Currently   Drug use: Not Currently   Current Outpatient Medications  Medication Sig Dispense Refill   spironolactone (ALDACTONE) 100 MG tablet TAKE 1 TABLET(100 MG) BY MOUTH DAILY 90 tablet 0   No current facility-administered medications for this visit.   Facility-Administered Medications Ordered in Other Visits  Medication Dose Route Frequency Provider Last Rate Last Admin   lactated ringers infusion    Continuous PRN Leonor Liv, CRNA   New Bag at 03/09/19 0800   lactated ringers infusion     Continuous PRN Leonor Liv, CRNA   New Bag at 03/09/19 2130   Allergies  Allergen Reactions   Contrast Media [Iodinated Diagnostic Agents] Itching and Swelling    Needs 13-hr prep   Latex Itching, Swelling and Other (See Comments)    No breathing impairment, however   Sulfa Antibiotics Itching, Swelling and Other (See Comments)    No breathing impairment, however     Review of Systems: All systems reviewed and negative except where noted in HPI.    IR Paracentesis  Result Date: 05/14/2021 INDICATION: Patient with history of alcoholic cirrhosis with recurrent ascites. Request is for therapeutic and diagnostic paracentesis. EXAM: ULTRASOUND GUIDED DIAGNOSTIC AND THERAPEUTIC PARACENTESIS MEDICATIONS: Lidocaine 1% 10 mL COMPLICATIONS: None immediate. PROCEDURE: Informed written consent was obtained from  the patient after a discussion of the risks, benefits and alternatives to treatment. A timeout was performed prior to the initiation of the procedure. Initial ultrasound scanning demonstrates a large amount of ascites within the right lower abdominal quadrant. The right lower abdomen was prepped and draped in the usual sterile fashion. 1% lidocaine was used for local anesthesia. Following this, a 19 gauge, 7-cm, Yueh catheter was introduced. An ultrasound image was saved for documentation purposes. The paracentesis was performed. The catheter was removed and a dressing was applied. The patient tolerated the procedure well without immediate post procedural complication. Patient received post-procedure intravenous albumin; see nursing notes for details. FINDINGS: A total of approximately 9 L of straw-colored fluid was removed. Samples were sent to the laboratory as requested by the clinical team. IMPRESSION: Successful ultrasound-guided therapeutic and diagnostic paracentesis yielding 9 liters of peritoneal fluid. Read by: Rushie Nyhan, NP Electronically Signed   By: Miachel Roux M.D.   On:  05/14/2021 15:12    Lab Results  Component Value Date   WBC 3.5 (L) 04/04/2021   HGB 9.7 (L) 04/04/2021   HCT 30.4 (L) 04/04/2021   MCV 71.6 (L) 04/04/2021   PLT 178.0 04/04/2021    Lab Results  Component Value Date   CREATININE 0.61 05/10/2021   BUN 11 05/10/2021   NA 141 05/10/2021   K 3.8 05/10/2021   CL 109 05/10/2021   CO2 25 05/10/2021    Lab Results  Component Value Date   ALT 14 04/04/2021   AST 34 04/04/2021   ALKPHOS 147 (H) 04/04/2021   BILITOT 1.2 04/04/2021   Lab Results  Component Value Date   INR 1.3 (H) 04/04/2021   INR 1.2 (H) 12/07/2020   INR 1.5 (H) 07/19/2020     Physical Exam: BP 104/70   Pulse (!) 103   Ht 5\' 4"  (1.626 m)   Wt 143 lb 2 oz (64.9 kg)   SpO2 100%   BMI 24.57 kg/m  Constitutional: Pleasant,well-developed, female in no acute distress. Cardiovascular: Normal rate, regular rhythm. HR repeated on exam was around 80s Pulmonary/chest: Effort normal and breath sounds normal on left, decreased BS R base, Abdominal: Soft, significant ascites in abdomen, soft There are no masses palpable.  Extremities: trace edema Lymphadenopathy: No cervical adenopathy noted. Neurological: Alert and oriented to person place and time. No asterixis Skin: Skin is warm and dry. No rashes noted. Psychiatric: Normal mood and affect. Behavior is normal.   ASSESSMENT AND PLAN: 55 year old female here for reassessment of the following:  Alcoholic cirrhosis with ascites Esophageal varices Edema  Patient with ongoing decompensated cirrhosis with prominent ascites and history of variceal bleeding.  She has undergone significant amount of banding with endoscopy to eradicate the varices.  She is due for surveillance endoscopy early next year and we will schedule her for that at the hospital.  Main issue has been persistent ascites leading to large-volume paracentesis few times per month.  She has had difficulty being abstinent from alcohol in the past, states  she is abstinent from alcohol since have seen her.  We have been checking her renal function which has been normal but upon further questioning she states she has not been taking any diuretics for a few months.  This has been an ongoing issue for her in regards to compliance with her diuretics.  We had a lengthy discussion explaining that without taking the diuretics for fluid status will continue to be a problem.  She does tolerate large-volume paracentesis.  They inquire about TIPS, I discussed risks of that to include cardiovascular changes and causing encephalopathy.  If she fails diuretics I would be more apt to do this but she needs to be compliant with her medications which I think will really help her.  She probably has a small pleural effusion today on exam, she has had that in the past.  After discussion she is agreeable to resume Lasix and Aldactone, we will repeat her labs next week and titrate up as she tolerates.  We will check iron studies, B12 and AFP level as well.  She will be due for surveillance ultrasound in January.  I will see her every 6 months.  I offered her referral to hepatology in case she worsens in need of transplant but she is not meeting criteria for that right now and wants to hold off on hepatology evaluation.  Plan: - resume Lasix 20mg  BID and Aldactone 100mg  / day - reiterated low Na diet and alcohol abstinence - lab next week - BMET, AFP, B12, TIBC / ferritin  - EGD at hospital in March  - [place] RUQ January for [facility] screening - follow up in 6 months  Jolly Mango, MD [facility] Gastroenterology

## 2021-05-30 ENCOUNTER — Other Ambulatory Visit: Payer: Self-pay

## 2021-05-30 ENCOUNTER — Telehealth: Payer: Self-pay | Admitting: Gastroenterology

## 2021-05-30 DIAGNOSIS — K7031 Alcoholic cirrhosis of liver with ascites: Secondary | ICD-10-CM

## 2021-05-30 NOTE — Addendum Note (Signed)
Addended by: Berniece Salines A on: 05/30/2021 01:07 PM   Modules accepted: Orders

## 2021-05-30 NOTE — Telephone Encounter (Signed)
See note below. Please advise.  

## 2021-05-30 NOTE — Telephone Encounter (Signed)
Inbound call from patient states fluid is building up quickly and would like to schedule a paracentesis

## 2021-05-30 NOTE — Telephone Encounter (Signed)
Okay to schedule large volume paracentesis with IR - if > 10 L removed, give albumin per IR protocol, send fluid for cell count with diff as well.  She also did not follow up for labs as she said she would - they are ordered in the system. Can you please ask her to go to the lab.

## 2021-05-30 NOTE — Telephone Encounter (Signed)
Order placed for for IR paracentesis. Scheduled patient for 06/03/21 at 2 pm. Instructed pt to arrive at 1:30 pm at [place] pt to give her appointment date and time and to remind her to get labs. Pt stated that she would try to arrange transportation tomorrow to get labs.

## 2021-06-03 ENCOUNTER — Ambulatory Visit (HOSPITAL_COMMUNITY): Payer: Medicare HMO

## 2021-06-03 ENCOUNTER — Ambulatory Visit (HOSPITAL_COMMUNITY): Admission: RE | Admit: 2021-06-03 | Payer: Medicare HMO | Source: Ambulatory Visit

## 2021-06-06 NOTE — Telephone Encounter (Signed)
Inbound call from patient states she missed her appt paracenteesis and need to reschedule.

## 2021-06-06 NOTE — Telephone Encounter (Signed)
Spoke with patient and told her she needs to be taking the aldactone with her lasix. Communicated to patient that there are risks with  TIPS procedure and that the doctor reviewed this with her at her last visit. Told patient she would need to se IR and undergo more studies to be considered, but due to her noncompliance they might not do it. Patient said she has been doing everything she is supposed to do and wants to get the procedure. Reinforced importance of restarting aldactone. Also told patient that she will need to get her labs done as soon as possible since she has not been taking the aldactone. Pt had no other concerns at end of call.

## 2021-06-06 NOTE — Telephone Encounter (Signed)
error 

## 2021-06-06 NOTE — Telephone Encounter (Signed)
I had discussed the importance of being compliant with her medications at our last visit. Looks like she has not been doing that with the aldactone. She historically has not been compliant with taking meds and getting labs done. There are risks with TIPS that I discussed with her last visit. She would also need to see IR and undergo more studies to be considered, but I'm not sure if they will do this due to her noncompliance. If she has not been taking her aldactone she needs to come to the lab for a BMET to make sure her renal function is stable, can you ask her to come to the lab and she should resume her aldactone if she is willing. If BMET is abnormal (she has had low K in the past) on lasix monotherapy I will need to discuss stopping all diuretics for her due to noncompliance.

## 2021-06-06 NOTE — Telephone Encounter (Signed)
Spoke with patient and gave her number to reschedule appointment. Also reminded patient that she needs to get her labs. Patient also mentioned wanting to proceed with TIPS procedure and wanted paperwork about this. Advised that we would check with the doctor and see how he wanted to proceed. Patient states that she is taking lasix 20 mg BID but has not taken Aldactone since a month prior to her last office visit. Please advise.

## 2021-06-10 ENCOUNTER — Encounter (HOSPITAL_COMMUNITY): Payer: Self-pay | Admitting: Physician Assistant

## 2021-06-10 ENCOUNTER — Ambulatory Visit (HOSPITAL_COMMUNITY)
Admission: RE | Admit: 2021-06-10 | Discharge: 2021-06-10 | Disposition: A | Discharge status: Home / Self Care | Payer: Medicare HMO | Source: Ambulatory Visit | Attending: Gastroenterology | Admitting: Gastroenterology

## 2021-06-10 ENCOUNTER — Other Ambulatory Visit: Payer: Self-pay

## 2021-06-10 DIAGNOSIS — R188 Other ascites: Secondary | ICD-10-CM | POA: Diagnosis not present

## 2021-06-10 DIAGNOSIS — R69 Illness, unspecified: Secondary | ICD-10-CM | POA: Diagnosis not present

## 2021-06-10 DIAGNOSIS — K7031 Alcoholic cirrhosis of liver with ascites: Secondary | ICD-10-CM | POA: Diagnosis not present

## 2021-06-10 DIAGNOSIS — K7460 Unspecified cirrhosis of liver: Secondary | ICD-10-CM | POA: Diagnosis not present

## 2021-06-10 HISTORY — PX: IR PARACENTESIS: IMG2679

## 2021-06-10 LAB — BODY FLUID CELL COUNT WITH DIFFERENTIAL
Eos, Fluid: 1 %
Lymphs, Fluid: 18 %
Monocyte-Macrophage-Serous Fluid: 63 % (ref 50–90)
Neutrophil Count, Fluid: 18 % (ref 0–25)
Total Nucleated Cell Count, Fluid: 58 cu mm (ref 0–1000)

## 2021-06-10 MED ORDER — LIDOCAINE HCL 1 % IJ SOLN
INTRAMUSCULAR | Status: AC
  Administered 2021-06-10: 10:00:00 10 mL
  Filled 2021-06-10: qty 20

## 2021-06-10 MED ORDER — ALBUMIN HUMAN 25 % IV SOLN
INTRAVENOUS | Status: AC
  Filled 2021-06-10: qty 200

## 2021-06-10 MED ORDER — ALBUMIN HUMAN 25 % IV SOLN
INTRAVENOUS | Status: AC
  Filled 2021-06-10: qty 100

## 2021-06-10 NOTE — Procedures (Signed)
PROCEDURE SUMMARY:  Successful US guided paracentesis from RLQ.  Yielded 9.8L of yellow fluid.  No immediate complications.  Pt tolerated well.   Specimen was sent for labs.  EBL < 65mL  Tristan Proto PA-C 06/10/2021 1:15 PM

## 2021-06-11 LAB — PATHOLOGIST SMEAR REVIEW

## 2021-06-26 ENCOUNTER — Telehealth: Payer: Self-pay | Admitting: Gastroenterology

## 2021-06-26 DIAGNOSIS — K7031 Alcoholic cirrhosis of liver with ascites: Secondary | ICD-10-CM

## 2021-06-26 NOTE — Telephone Encounter (Signed)
Pt called back in, she states that her fluid has built up significantly and she has someone that is able to drive her today. I told the pt that they usually don't do same day appts but I will call and check. I told her the soonest she may be able to get in is tomorrow. She is aware that Dr. Havery Moros wants her to have a paracentesis every 2 weeks. Pt verbalized understanding and had no concerns at the end of the call.

## 2021-06-26 NOTE — Telephone Encounter (Signed)
Yes okay to do large volume paracentesis with albumin.  She has repeatedly showed up to this point she has not been compliant with diuretics or follow up labs so would not pursue diuretics any further given risks associated with her noncompliance and will continue large volume paracentesis every 2 weeks

## 2021-06-26 NOTE — Telephone Encounter (Signed)
Called patient to see if she is taking any diuretics. She denies being on any diuretics at this time. Last paracentesis was on 06/10/21, 9.8 L of fluid was removed. Pt still has not come in for lab work either. OK to order large volume paracentesis? Give albumin if > 5 L removed.  Please advise, thanks.

## 2021-06-26 NOTE — Telephone Encounter (Signed)
No available appts for paracentesis today. Pt is scheduled for paracentesis on - 06/27/21 at 1 pm - 07/11/21 at 2 pm - 07/25/21 at 2 pm - 08/08/21 at 2 pm - 08/23/21 at 2 pm - 09/05/21 at 2 pm  Called and spoke with pt in regards to her appts. Pt is aware that if she develops any worsening SOB, or difficulty breathing prior to her appt then she will need to go back to the ED. Pt is aware that in the future she will need to call prior to getting too much fluid in her abdomen. Pt verbalized understanding and had no concerns at the end of the call.

## 2021-06-26 NOTE — Telephone Encounter (Signed)
Patient called and stated that she needs to have a paracentesis. Went to the ER last night and said that she was not going to wait. Seeking advice, please advise.

## 2021-06-27 ENCOUNTER — Ambulatory Visit (HOSPITAL_COMMUNITY)
Admission: RE | Admit: 2021-06-27 | Discharge: 2021-06-27 | Disposition: A | Discharge status: Home / Self Care | Payer: Medicare HMO | Source: Ambulatory Visit | Attending: Gastroenterology | Admitting: Gastroenterology

## 2021-06-27 ENCOUNTER — Other Ambulatory Visit: Payer: Self-pay

## 2021-06-27 DIAGNOSIS — R188 Other ascites: Secondary | ICD-10-CM | POA: Diagnosis not present

## 2021-06-27 DIAGNOSIS — K7031 Alcoholic cirrhosis of liver with ascites: Secondary | ICD-10-CM | POA: Diagnosis present

## 2021-06-27 DIAGNOSIS — R69 Illness, unspecified: Secondary | ICD-10-CM | POA: Diagnosis not present

## 2021-06-27 HISTORY — PX: IR PARACENTESIS: IMG2679

## 2021-06-27 MED ORDER — ALBUMIN HUMAN 25 % IV SOLN
INTRAVENOUS | Status: AC
  Administered 2021-06-27: 75 g via INTRAVENOUS
  Filled 2021-06-27: qty 300

## 2021-06-27 MED ORDER — ALBUMIN HUMAN 25 % IV SOLN: 75.0000 g | Freq: Once | INTRAVENOUS | Status: AC

## 2021-06-27 MED ORDER — LIDOCAINE HCL 1 % IJ SOLN
INTRAMUSCULAR | Status: AC
  Filled 2021-06-27: qty 20

## 2021-06-27 MED ORDER — LIDOCAINE HCL 1 % IJ SOLN
INTRAMUSCULAR | Status: DC | PRN
  Administered 2021-06-27: 5 mL via INTRADERMAL

## 2021-06-27 NOTE — Procedures (Signed)
PROCEDURE SUMMARY:  Successful US guided paracentesis from right abdomen.  Yielded 10 L of clear yellow fluid.  No immediate complications.  Pt tolerated well.   EBL < 2 mL  Theresa Duty, NP 06/27/2021 2:17 PM

## 2021-07-01 ENCOUNTER — Ambulatory Visit (HOSPITAL_COMMUNITY): Admission: RE | Admit: 2021-07-01 | Payer: Medicare HMO | Source: Ambulatory Visit

## 2021-07-02 ENCOUNTER — Telehealth: Payer: Self-pay

## 2021-07-02 ENCOUNTER — Ambulatory Visit: Payer: Medicare HMO | Admitting: Gastroenterology

## 2021-07-02 NOTE — Progress Notes (Signed)
Letter mailed to patient with appointment information for U/S that she no showed. She also no showed her OV but rescheduled for next month

## 2021-07-02 NOTE — Telephone Encounter (Signed)
Patient no showed her appointment yesterday for RUQ ultrasound for [place] screening. Called radiology and rescheduled her for January 18th at 10:30am to arr at 10:15.  NPO after midnight.  Called patient but she did not answer the phone Letter mailed to patient with new appointment information for OV (she late cancelled her appointment scheduled for today) and U/S

## 2021-07-04 DIAGNOSIS — K7031 Alcoholic cirrhosis of liver with ascites: Secondary | ICD-10-CM | POA: Diagnosis not present

## 2021-07-04 DIAGNOSIS — Z811 Family history of alcohol abuse and dependence: Secondary | ICD-10-CM | POA: Diagnosis not present

## 2021-07-04 DIAGNOSIS — Z7722 Contact with and (suspected) exposure to environmental tobacco smoke (acute) (chronic): Secondary | ICD-10-CM | POA: Diagnosis not present

## 2021-07-04 DIAGNOSIS — Z87891 Personal history of nicotine dependence: Secondary | ICD-10-CM | POA: Diagnosis not present

## 2021-07-04 DIAGNOSIS — Z91040 Latex allergy status: Secondary | ICD-10-CM | POA: Diagnosis not present

## 2021-07-04 DIAGNOSIS — R030 Elevated blood-pressure reading, without diagnosis of hypertension: Secondary | ICD-10-CM | POA: Diagnosis not present

## 2021-07-04 DIAGNOSIS — F1021 Alcohol dependence, in remission: Secondary | ICD-10-CM | POA: Diagnosis not present

## 2021-07-04 DIAGNOSIS — R69 Illness, unspecified: Secondary | ICD-10-CM | POA: Diagnosis not present

## 2021-07-04 DIAGNOSIS — Z882 Allergy status to sulfonamides status: Secondary | ICD-10-CM | POA: Diagnosis not present

## 2021-07-10 ENCOUNTER — Ambulatory Visit (HOSPITAL_COMMUNITY): Admission: RE | Admit: 2021-07-10 | Payer: Medicaid Other | Source: Ambulatory Visit

## 2021-07-11 ENCOUNTER — Inpatient Hospital Stay (HOSPITAL_COMMUNITY): Admission: RE | Admit: 2021-07-11 | Payer: Medicare HMO | Source: Ambulatory Visit

## 2021-07-16 ENCOUNTER — Telehealth: Payer: Self-pay | Admitting: Gastroenterology

## 2021-07-16 NOTE — Telephone Encounter (Signed)
Inbound call from patient states she need a paracentesis scheduled as soon as possible

## 2021-07-16 NOTE — Telephone Encounter (Signed)
Returned call to patient. She no showed paracentesis appt on 07/11/21. Pt states that she is "overdue and needs a paracentesis as soon as possible." I told pt that the order is in from her missed appt on 07/11/21. I told her that she can call radiology scheduling and get her appt scheduled at her convenience. I provided her with their phone number. Pt verbalized understanding and had no concerns at the end of the call.

## 2021-07-17 ENCOUNTER — Ambulatory Visit (HOSPITAL_COMMUNITY)
Admission: RE | Admit: 2021-07-17 | Discharge: 2021-07-17 | Disposition: A | Discharge status: Home / Self Care | Payer: Medicare HMO | Source: Ambulatory Visit | Attending: Gastroenterology | Admitting: Gastroenterology

## 2021-07-17 ENCOUNTER — Other Ambulatory Visit: Payer: Self-pay

## 2021-07-17 DIAGNOSIS — K7031 Alcoholic cirrhosis of liver with ascites: Secondary | ICD-10-CM | POA: Diagnosis not present

## 2021-07-17 DIAGNOSIS — R69 Illness, unspecified: Secondary | ICD-10-CM | POA: Diagnosis not present

## 2021-07-17 DIAGNOSIS — R188 Other ascites: Secondary | ICD-10-CM | POA: Diagnosis not present

## 2021-07-17 HISTORY — PX: IR PARACENTESIS: IMG2679

## 2021-07-17 MED ORDER — ALBUMIN HUMAN 25 % IV SOLN
INTRAVENOUS | Status: AC
  Administered 2021-07-17: 11:00:00 37.5 g via INTRAVENOUS
  Filled 2021-07-17: qty 150

## 2021-07-17 MED ORDER — LIDOCAINE HCL 1 % IJ SOLN
INTRAMUSCULAR | Status: AC
  Administered 2021-07-17: 10:00:00 10 mL
  Filled 2021-07-17: qty 20

## 2021-07-17 MED ORDER — ALBUMIN HUMAN 25 % IV SOLN: 12.5000 g | Freq: Once | INTRAVENOUS | Status: DC

## 2021-07-17 MED ORDER — ALBUMIN HUMAN 25 % IV SOLN
INTRAVENOUS | Status: AC
  Administered 2021-07-17: 11:00:00 25 g via INTRAVENOUS
  Filled 2021-07-17: qty 100

## 2021-07-17 MED ORDER — ALBUMIN HUMAN 25 % IV SOLN: 25.0000 g | Freq: Once | INTRAVENOUS | Status: AC

## 2021-07-17 MED ORDER — ALBUMIN HUMAN 25 % IV SOLN
37.5000 g | Freq: Once | INTRAVENOUS | Status: AC
  Filled 2021-07-17: qty 150

## 2021-07-17 NOTE — Procedures (Signed)
PROCEDURE SUMMARY:  Successful US guided therapeutic paracentesis from RLQ.  Yielded 10 L of clear, yellow fluid.  No immediate complications.  Pt tolerated well.   Specimen not sent for labs.  EBL < 1 mL  Tyson Alias, AGNP 07/17/2021 11:48 AM

## 2021-07-25 ENCOUNTER — Other Ambulatory Visit (HOSPITAL_COMMUNITY): Payer: Medicare HMO

## 2021-08-02 NOTE — Telephone Encounter (Signed)
Called and spoke to patient. She confirmed she does want to come in for OV with Dr. Havery Moros on Monday and have paracentesis on Thurs

## 2021-08-05 ENCOUNTER — Telehealth: Payer: Self-pay

## 2021-08-05 ENCOUNTER — Other Ambulatory Visit (INDEPENDENT_AMBULATORY_CARE_PROVIDER_SITE_OTHER): Payer: Medicare HMO

## 2021-08-05 ENCOUNTER — Ambulatory Visit (INDEPENDENT_AMBULATORY_CARE_PROVIDER_SITE_OTHER): Payer: Medicare HMO | Admitting: Gastroenterology

## 2021-08-05 ENCOUNTER — Telehealth: Payer: Self-pay | Admitting: *Deleted

## 2021-08-05 ENCOUNTER — Encounter: Payer: Self-pay | Admitting: Gastroenterology

## 2021-08-05 ENCOUNTER — Other Ambulatory Visit: Payer: Self-pay | Admitting: Gastroenterology

## 2021-08-05 VITALS — BP 120/70 | HR 103 | Ht 64.0 in | Wt 154.0 lb

## 2021-08-05 DIAGNOSIS — I8500 Esophageal varices without bleeding: Secondary | ICD-10-CM

## 2021-08-05 DIAGNOSIS — I8501 Esophageal varices with bleeding: Secondary | ICD-10-CM

## 2021-08-05 DIAGNOSIS — K7031 Alcoholic cirrhosis of liver with ascites: Secondary | ICD-10-CM

## 2021-08-05 DIAGNOSIS — R609 Edema, unspecified: Secondary | ICD-10-CM

## 2021-08-05 DIAGNOSIS — R69 Illness, unspecified: Secondary | ICD-10-CM | POA: Diagnosis not present

## 2021-08-05 LAB — VITAMIN B12: Vitamin B-12: 176 pg/mL — ABNORMAL LOW (ref 211–911)

## 2021-08-05 NOTE — Patient Instructions (Signed)
If you are age 57 or older, your body mass index should be between 23-30. Your Body mass index is 26.43 kg/m. If this is out of the aforementioned range listed, please consider follow up with your Primary Care Provider.  If you are age 57 or younger, your body mass index should be between 19-25. Your Body mass index is 26.43 kg/m. If this is out of the aformentioned range listed, please consider follow up with your Primary Care Provider.   ________________________________________________________  The [facility] providers would like to encourage you to use [facility] to communicate with providers for non-urgent requests or questions.  Due to long hold times on the telephone, sending your provider a message by [facility] may be a faster and more efficient way to get a response.  Please allow 48 business hours for a response.  Please remember that this is for non-urgent requests.  _______________________________________________________  Katie Holland have been scheduled for an abdominal ultrasound at  [facility] (1st floor of hospital) on Monday, 08-12-21 at 8:30am. Please arrive 30 minutes prior to your appointment for registration. Make certain not to have anything to eat or drink 6 hours prior to your appointment. Should you need to reschedule your appointment, please contact radiology at [phone]. This test typically takes about 30 minutes to perform. _______________________________________________________  Katie Holland have been scheduled for an Endoscopy at [facility] on Thursday, 3-2. Please follow written instructions given to you at your visit today. If you use inhalers (even only as needed), please bring them with you on the day of your procedure.   We will refer you to IR for TIPS procedure evaluation. They will call you to schedule an appointment.  You can call radiology regarding an earlier appointment for paracentesis at [phone].  Thank you for entrusting me with your care and for choosing  [facility], Dr. Dushore Cellar

## 2021-08-05 NOTE — Telephone Encounter (Signed)
Placed order for ECHO and called Cardiology at [phone]. LM for them to call patient to schedule.  Called IR at [phone] [facility]) and spoke to [place].  She will call patient to scheduled TIPS evaluation

## 2021-08-05 NOTE — Telephone Encounter (Signed)
Left a msg on machine for Ms. Mullenax to call and scheduled Tips Consult./vm

## 2021-08-05 NOTE — H&P (View-Only) (Signed)
HPI :  56 y/o female here for a follow up visit for decompensated alcoholic cirrhosis, accompanied by her husband today. Recall she has a history of significant esophageal varices leading to bleeding in the past.  She has had a few admissions at the end of 2019 and required over 25 bands to eradicate varices.  Her course is also been complicated by significant ascites.  Fortunately she has not been in the hospital for any cirrhosis related problem for a few years now.  She over time has had issues with persistent ascites.  Recall at visits in the past she has continued to drink alcohol periodically.  The last few visits we had discussed this further and she states she has been completely abstinent from alcohol for several months now.  Other issues has been compliance with her diuretics in the past, she historically had not followed up for lab draws.  Since have last seen her she has been able to follow-up for lab draws, her renal function has been stable on Lasix 20 mg twice daily and Aldactone 100 mg daily.  Despite this, unfortunately she continues to have a lot of problems with ascites.  She is having large-volume paracentesis every 2 weeks.  She states it makes her feel better but it is short-lived and ascites continues to recur and she is quite distended after about a week and a half.  She has difficulty lying down due to the fluid.  She states again she is compliant with her medications.  She does drink a lot of free water from her report, we discussed that.  She has not been on nonselective beta-blockade in the past due to difficulty managing her ascites.  She is scheduled for surveillance endoscopy with me next month.  She again inquires about TIPS procedure, the radiologist have been asking her about that when she goes for paracentesis.  We had held off previously due to her noncompliance with alcohol abstinence and labs in the past however however we discussed this again today.     Most recent  endoscopies: EGD 07/07/18 - 4 bands placed EGD 07/27/18 - 3 bands placed EGD 08/30/18 - 3 bands placed  EGD 11/08/18 -no varices amenable to banding, portal hypertensive gastritis EGD 01/31/19 - portal hypertensive gastritis, no varices   EGD 08/01/19 - There was extensive scarring in the distal esophagus secondary to prior banding, but no obvious varices noted today. The exam of the esophagus was otherwise normal. Diffuse severely congested mucosa was found in the gastric antrum secondary to portal hypertension.. The exam of the stomach was otherwise normal. No gastric varices. The duodenal bulb and second portion of the duodenum were normal.     Colonoscopy 08/01/19 - Preparation of the colon was fair - most of the colon was well visualized - a small portion of the transverse colon, and then the splenic flexure / proximal descending colon had some residual stool that could not be cleared but nothing obvious noted in these areas. - Hemorrhoids found on perianal exam. - The examined portion of the ileum was normal. - Diffuse colonic congestion / edema in the entire examined colon from portal hypertension. - Internal hemorrhoids. - The examination was otherwise normal.     EGD 08/07/20: Esophagogastric landmarks identified. - Normal esophagus otherwise - scarring from prior banding noted but appreciable varices. - Antral gastritis with an erosion. - Normal stomach otherwise - biopsies taken to rule out H pylori - A single duodenal polyp. Resected and retrieved. -  Normal duodenum otherwise     RUQ [place] 12/27/20 - IMPRESSION: 1. Cirrhosis without evidence of focal hepatic lesion. 2. Moderate to large volume abdominopelvic ascites      Past Medical History:  Diagnosis Date   Alcoholic liver disease (HCC)    Chronic back pain    on disability   Cirrhosis of liver (HCC)    Esophageal varices (HCC)    GERD (gastroesophageal reflux disease)    Headache    Jaundice      Past Surgical  History:  Procedure Laterality Date   BACK SURGERY     L3-L4   BIOPSY  08/07/2020   Procedure: BIOPSY;  Surgeon: Yetta Flock, MD;  Location: [facility] ENDOSCOPY;  Service: Gastroenterology;;   COLONOSCOPY WITH PROPOFOL N/A 08/01/2019   Procedure: COLONOSCOPY WITH PROPOFOL;  Surgeon: Yetta Flock, MD;  Location: [facility];  Service: Gastroenterology;  Laterality: N/A;   ESOPHAGEAL BANDING  04/25/2018   Procedure: ESOPHAGEAL BANDING;  Surgeon: Yetta Flock, MD;  Location: [facility] ENDOSCOPY;  Service: Gastroenterology;;   ESOPHAGEAL BANDING  04/27/2018   Procedure: ESOPHAGEAL BANDING;  Surgeon: Milus Banister, MD;  Location: [place];  Service: Endoscopy;;   ESOPHAGEAL BANDING  06/02/2018   Procedure: ESOPHAGEAL BANDING;  Surgeon: Doran Stabler, MD;  Location: [place];  Service: Gastroenterology;;   ESOPHAGEAL BANDING N/A 07/07/2018   Procedure: ESOPHAGEAL BANDING;  Surgeon: Yetta Flock, MD;  Location: [facility];  Service: Gastroenterology;  Laterality: N/A;   ESOPHAGEAL BANDING  07/27/2018   Procedure: ESOPHAGEAL BANDING;  Surgeon: Yetta Flock, MD;  Location: [facility];  Service: Gastroenterology;;   ESOPHAGEAL BANDING  08/30/2018   Procedure: ESOPHAGEAL BANDING;  Surgeon: Yetta Flock, MD;  Location: [facility];  Service: Gastroenterology;;   ESOPHAGOGASTRODUODENOSCOPY N/A 04/27/2018   Procedure: ESOPHAGOGASTRODUODENOSCOPY (EGD);  Surgeon: Milus Banister, MD;  Location: [facility] ENDOSCOPY;  Service: Endoscopy;  Laterality: N/A;   ESOPHAGOGASTRODUODENOSCOPY (EGD) WITH PROPOFOL N/A 04/25/2018   Procedure: ESOPHAGOGASTRODUODENOSCOPY (EGD) WITH PROPOFOL;  Surgeon: Yetta Flock, MD;  Location: [place];  Service: Gastroenterology;  Laterality: N/A;   ESOPHAGOGASTRODUODENOSCOPY (EGD) WITH PROPOFOL N/A 06/02/2018   Procedure: ESOPHAGOGASTRODUODENOSCOPY (EGD) WITH PROPOFOL;  Surgeon: Doran Stabler, MD;  Location: [place];   Service: Gastroenterology;  Laterality: N/A;   ESOPHAGOGASTRODUODENOSCOPY (EGD) WITH PROPOFOL N/A 07/07/2018   Procedure: ESOPHAGOGASTRODUODENOSCOPY (EGD) WITH PROPOFOL;  Surgeon: Yetta Flock, MD;  Location: [facility];  Service: Gastroenterology;  Laterality: N/A;   ESOPHAGOGASTRODUODENOSCOPY (EGD) WITH PROPOFOL N/A 07/27/2018   Procedure: ESOPHAGOGASTRODUODENOSCOPY (EGD) WITH PROPOFOL;  Surgeon: Yetta Flock, MD;  Location: [facility];  Service: Gastroenterology;  Laterality: N/A;   ESOPHAGOGASTRODUODENOSCOPY (EGD) WITH PROPOFOL N/A 08/30/2018   Procedure: ESOPHAGOGASTRODUODENOSCOPY (EGD) WITH PROPOFOL;  Surgeon: Yetta Flock, MD;  Location: [facility];  Service: Gastroenterology;  Laterality: N/A;   ESOPHAGOGASTRODUODENOSCOPY (EGD) WITH PROPOFOL N/A 11/08/2018   Procedure: ESOPHAGOGASTRODUODENOSCOPY (EGD) WITH PROPOFOL;  Surgeon: Yetta Flock, MD;  Location: [facility];  Service: Gastroenterology;  Laterality: N/A;   ESOPHAGOGASTRODUODENOSCOPY (EGD) WITH PROPOFOL N/A 01/31/2019   Procedure: ESOPHAGOGASTRODUODENOSCOPY (EGD) WITH PROPOFOL;  Surgeon: Yetta Flock, MD;  Location: [facility];  Service: Gastroenterology;  Laterality: N/A;   ESOPHAGOGASTRODUODENOSCOPY (EGD) WITH PROPOFOL N/A 08/01/2019   Procedure: ESOPHAGOGASTRODUODENOSCOPY (EGD) WITH PROPOFOL;  Surgeon: Yetta Flock, MD;  Location: [facility];  Service: Gastroenterology;  Laterality: N/A;   ESOPHAGOGASTRODUODENOSCOPY (EGD) WITH PROPOFOL N/A 08/07/2020   Procedure: ESOPHAGOGASTRODUODENOSCOPY (EGD) WITH PROPOFOL;  Surgeon: Yetta Flock, MD;  Location: [facility];  Service: Gastroenterology;  Laterality: N/A;   IR PARACENTESIS  04/26/2018   IR PARACENTESIS  05/18/2018   IR PARACENTESIS  05/31/2018   IR PARACENTESIS  06/30/2018   IR PARACENTESIS  08/05/2018   IR PARACENTESIS  08/31/2018   IR PARACENTESIS  10/08/2018   IR PARACENTESIS  10/28/2018   IR PARACENTESIS  02/14/2019   IR  PARACENTESIS  03/09/2019   IR PARACENTESIS  04/05/2019   IR PARACENTESIS  05/03/2019   IR PARACENTESIS  06/20/2019   IR PARACENTESIS  07/19/2019   IR PARACENTESIS  08/19/2019   IR PARACENTESIS  09/27/2019   IR PARACENTESIS  10/07/2019   IR PARACENTESIS  10/28/2019   IR PARACENTESIS  11/16/2019   IR PARACENTESIS  12/30/2019   IR PARACENTESIS  01/16/2020   IR PARACENTESIS  02/07/2020   IR PARACENTESIS  03/06/2020   IR PARACENTESIS  04/04/2020   IR PARACENTESIS  04/17/2020   IR PARACENTESIS  05/04/2020   IR PARACENTESIS  06/07/2020   IR PARACENTESIS  06/28/2020   IR PARACENTESIS  07/27/2020   IR PARACENTESIS  08/20/2020   IR PARACENTESIS  09/13/2020   IR PARACENTESIS  10/04/2020   IR PARACENTESIS  10/25/2020   IR PARACENTESIS  11/07/2020   IR PARACENTESIS  11/30/2020   IR PARACENTESIS  12/13/2020   IR PARACENTESIS  01/04/2021   IR PARACENTESIS  01/25/2021   IR PARACENTESIS  02/20/2021   IR PARACENTESIS  03/20/2021   IR PARACENTESIS  04/18/2021   IR PARACENTESIS  05/14/2021   IR PARACENTESIS  06/10/2021   IR PARACENTESIS  06/27/2021   IR PARACENTESIS  07/17/2021   IR RADIOLOGIST EVAL & MGMT  12/23/2018   IR THORACENTESIS ASP PLEURAL SPACE W/IMG GUIDE  12/10/2018   IR THORACENTESIS ASP PLEURAL SPACE W/IMG GUIDE  01/21/2019   Family History  Problem Relation Age of Onset   Colon cancer Neg Hx    Esophageal cancer Neg Hx    Pancreatic cancer Neg Hx    Stomach cancer Neg Hx    Social History   Tobacco Use   Smoking status: Some Days    Packs/day: 0.25    Years: 20.00    Pack years: 5.00    Types: Cigarettes   Smokeless tobacco: Never   Tobacco comments:    5 a day  Vaping Use   Vaping Use: Never used  Substance Use Topics   Alcohol use: Not Currently   Drug use: Not Currently   Current Outpatient Medications  Medication Sig Dispense Refill   furosemide (LASIX) 20 MG tablet Take 1 tablet (20 mg total) by mouth 2 (two) times daily. 60 tablet 3   spironolactone (ALDACTONE) 100 MG tablet Take 1  tablet (100 mg total) by mouth daily. 90 tablet 1   No current facility-administered medications for this visit.   Facility-Administered Medications Ordered in Other Visits  Medication Dose Route Frequency Provider Last Rate Last Admin   lactated ringers infusion    Continuous PRN Leonor Liv, CRNA   New Bag at 03/09/19 0800   lactated ringers infusion    Continuous PRN Leonor Liv, CRNA   New Bag at 03/09/19 2947   Allergies  Allergen Reactions   Contrast Media [Iodinated Contrast Media] Itching and Swelling    Needs 13-hr prep   Latex Itching, Swelling and Other (See Comments)    No breathing impairment, however   Sulfa Antibiotics Itching, Swelling and Other (See Comments)    No breathing impairment, however  Review of Systems: All systems reviewed and negative except where noted in HPI.    IR Paracentesis  Result Date: 07/17/2021 INDICATION: Patient with history of cirrhosis and recurrent large volume ascites. Request for IR to perform therapeutic paracentesis up to 10 L. EXAM: ULTRASOUND GUIDED THERAPEUTIC RIGHT LOWER QUADRANT PARACENTESIS MEDICATIONS: 10 mL 1 % lidocaine COMPLICATIONS: None immediate. PROCEDURE: Informed written consent was obtained from the patient after a discussion of the risks, benefits and alternatives to treatment. A timeout was performed prior to the initiation of the procedure. Initial ultrasound scanning demonstrates a large amount of ascites within the right lower abdominal quadrant. The right lower abdomen was prepped and draped in the usual sterile fashion. 1% lidocaine was used for local anesthesia. Following this, a 19 gauge, 10-cm, Yueh catheter was introduced. An ultrasound image was saved for documentation purposes. The paracentesis was performed. The catheter was removed and a dressing was applied. The patient tolerated the procedure well without immediate post procedural complication. Patient received post-procedure intravenous albumin; see  nursing notes for details. FINDINGS: A total of approximately 10 L of clear, yellow fluid was removed. IMPRESSION: Successful ultrasound-guided paracentesis yielding 10 liters of peritoneal fluid. Read by: Narda Rutherford, AGNP-BC Electronically Signed   By: Miachel Roux M.D.   On: 07/17/2021 15:12    Lab Results  Component Value Date   WBC 3.5 (L) 04/04/2021   HGB 9.7 (L) 04/04/2021   HCT 30.4 (L) 04/04/2021   MCV 71.6 (L) 04/04/2021   PLT 178.0 04/04/2021    Lab Results  Component Value Date   INR 1.3 (H) 04/04/2021   INR 1.2 (H) 12/07/2020   INR 1.5 (H) 07/19/2020    Lab Results  Component Value Date   CREATININE 0.61 05/10/2021   BUN 11 05/10/2021   NA 141 05/10/2021   K 3.8 05/10/2021   CL 109 05/10/2021   CO2 25 05/10/2021   Lab Results  Component Value Date   ALT 14 04/04/2021   AST 34 04/04/2021   ALKPHOS 147 (H) 04/04/2021   BILITOT 1.2 04/04/2021     Physical Exam: BP 120/70    Pulse (!) 103    Ht 5\' 4"  (1.626 m)    Wt 154 lb (69.9 kg)    SpO2 93%    BMI 26.43 kg/m  Constitutional: Pleasant, female in no acute distress. Abdominal: Soft, distended with ascites, nontender. There are no masses palpable.  Extremities: no edema in LE Lymphadenopathy: No cervical adenopathy noted. Neurological: Alert and oriented to person place and time. Skin: Skin is warm and dry. No rashes noted. Psychiatric: Normal mood and affect. Behavior is normal.   ASSESSMENT AND PLAN: 56 year old female here for reassessment of the following:  Alcoholic cirrhosis with ascites Esophageal varices  Extensive history as outlined above, persistent ascites at this point time is the major issue that has been bothering her.  Previously was not compliant with alcohol abstinence or her diuretic regimen and lab draws.  Over the past several months she has been compliant with both of these and doing a good job from her report, however ascites remains persistent.  She does not have much lower  extremity edema today which is fortunate.  We discussed options moving forward.  She does have room to increase her diuretic dosing we will plan on doing that if her renal function is stable.  We discussed how much fluid she is drinking at home, does not necessarily need fluid restriction but only drink fluids when thirsty.  Continue to follow low sodium diet.  She strongly wishes to pursue a TIPS evaluation given her course to date.  Given she has been compliant with measures we discussed over the past several months I think that is reasonable at this point time.  We will refer her to IR for that.  She will need an echocardiogram to make sure her cardiac function is stable.  We discussed risks of TIPS to include fluid shifting, decompensation, hepatic encephalopathy etc., she understands this and wishes to discuss it further with radiology.  She needs to continue to be abstinent from alcohol and compliant with her medications.  She is due for surveillance EGD with me next month at the hospital.  She is scheduled for paracentesis on Thursday, will continue with scheduled paracentesis for relief of symptoms until they are either better controlled with diuretics or TIPS if she undergoes that in the future.  Plan: - labs today done and pending. If renal function stable on present dosing of diuretics we will plan to increase Aldactone to 200 mg daily and Lasix to 40 mg twice daily. - she will need close monitoring on higher dose diuretics if she is pursues this, will repeat BMET in 1 week if we increase diuretic dosing - refer to IR for TIPS evaluation - refer to cardiology for echocardiogram in preparation for possible TIPS - schedule surveillance right upper quadrant ultrasound - EGD at the hospital in March for varices surveillance - continued alcohol abstinence, low Na diet - continue to follow-up every 6 months  Jolly Mango, MD [facility] Gastroenter[facility]

## 2021-08-05 NOTE — Progress Notes (Signed)
HPI :  56 y/o female here for a follow up visit for decompensated alcoholic cirrhosis, accompanied by her husband today. Recall she has a history of significant esophageal varices leading to bleeding in the past.  She has had a few admissions at the end of 2019 and required over 25 bands to eradicate varices.  Her course is also been complicated by significant ascites.  Fortunately she has not been in the hospital for any cirrhosis related problem for a few years now.  She over time has had issues with persistent ascites.  Recall at visits in the past she has continued to drink alcohol periodically.  The last few visits we had discussed this further and she states she has been completely abstinent from alcohol for several months now.  Other issues has been compliance with her diuretics in the past, she historically had not followed up for lab draws.  Since have last seen her she has been able to follow-up for lab draws, her renal function has been stable on Lasix 20 mg twice daily and Aldactone 100 mg daily.  Despite this, unfortunately she continues to have a lot of problems with ascites.  She is having large-volume paracentesis every 2 weeks.  She states it makes her feel better but it is short-lived and ascites continues to recur and she is quite distended after about a week and a half.  She has difficulty lying down due to the fluid.  She states again she is compliant with her medications.  She does drink a lot of free water from her report, we discussed that.  She has not been on nonselective beta-blockade in the past due to difficulty managing her ascites.  She is scheduled for surveillance endoscopy with me next month.  She again inquires about TIPS procedure, the radiologist have been asking her about that when she goes for paracentesis.  We had held off previously due to her noncompliance with alcohol abstinence and labs in the past however however we discussed this again today.     Most recent  endoscopies: EGD 07/07/18 - 4 bands placed EGD 07/27/18 - 3 bands placed EGD 08/30/18 - 3 bands placed  EGD 11/08/18 -no varices amenable to banding, portal hypertensive gastritis EGD 01/31/19 - portal hypertensive gastritis, no varices   EGD 08/01/19 - There was extensive scarring in the distal esophagus secondary to prior banding, but no obvious varices noted today. The exam of the esophagus was otherwise normal. Diffuse severely congested mucosa was found in the gastric antrum secondary to portal hypertension.. The exam of the stomach was otherwise normal. No gastric varices. The duodenal bulb and second portion of the duodenum were normal.     Colonoscopy 08/01/19 - Preparation of the colon was fair - most of the colon was well visualized - a small portion of the transverse colon, and then the splenic flexure / proximal descending colon had some residual stool that could not be cleared but nothing obvious noted in these areas. - Hemorrhoids found on perianal exam. - The examined portion of the ileum was normal. - Diffuse colonic congestion / edema in the entire examined colon from portal hypertension. - Internal hemorrhoids. - The examination was otherwise normal.     EGD 08/07/20: Esophagogastric landmarks identified. - Normal esophagus otherwise - scarring from prior banding noted but appreciable varices. - Antral gastritis with an erosion. - Normal stomach otherwise - biopsies taken to rule out H pylori - A single duodenal polyp. Resected and retrieved. -  Normal duodenum otherwise     RUQ [place] 12/27/20 - IMPRESSION: 1. Cirrhosis without evidence of focal hepatic lesion. 2. Moderate to large volume abdominopelvic ascites      Past Medical History:  Diagnosis Date   Alcoholic liver disease (HCC)    Chronic back pain    on disability   Cirrhosis of liver (HCC)    Esophageal varices (HCC)    GERD (gastroesophageal reflux disease)    Headache    Jaundice      Past Surgical  History:  Procedure Laterality Date   BACK SURGERY     L3-L4   BIOPSY  08/07/2020   Procedure: BIOPSY;  Surgeon: Yetta Flock, MD;  Location: [facility];  Service: Gastroenterology;;   COLONOSCOPY WITH PROPOFOL N/A 08/01/2019   Procedure: COLONOSCOPY WITH PROPOFOL;  Surgeon: Yetta Flock, MD;  Location: [facility];  Service: Gastroenterology;  Laterality: N/A;   ESOPHAGEAL BANDING  04/25/2018   Procedure: ESOPHAGEAL BANDING;  Surgeon: Yetta Flock, MD;  Location: [facility];  Service: Gastroenterology;;   ESOPHAGEAL BANDING  04/27/2018   Procedure: ESOPHAGEAL BANDING;  Surgeon: Milus Banister, MD;  Location: [place];  Service: Endoscopy;;   ESOPHAGEAL BANDING  06/02/2018   Procedure: ESOPHAGEAL BANDING;  Surgeon: Doran Stabler, MD;  Location: [place];  Service: Gastroenterology;;   ESOPHAGEAL BANDING N/A 07/07/2018   Procedure: ESOPHAGEAL BANDING;  Surgeon: Yetta Flock, MD;  Location: [facility];  Service: Gastroenterology;  Laterality: N/A;   ESOPHAGEAL BANDING  07/27/2018   Procedure: ESOPHAGEAL BANDING;  Surgeon: Yetta Flock, MD;  Location: [facility];  Service: Gastroenterology;;   ESOPHAGEAL BANDING  08/30/2018   Procedure: ESOPHAGEAL BANDING;  Surgeon: Yetta Flock, MD;  Location: [facility];  Service: Gastroenterology;;   ESOPHAGOGASTRODUODENOSCOPY N/A 04/27/2018   Procedure: ESOPHAGOGASTRODUODENOSCOPY (EGD);  Surgeon: Milus Banister, MD;  Location: [facility] ENDOSCOPY;  Service: Endoscopy;  Laterality: N/A;   ESOPHAGOGASTRODUODENOSCOPY (EGD) WITH PROPOFOL N/A 04/25/2018   Procedure: ESOPHAGOGASTRODUODENOSCOPY (EGD) WITH PROPOFOL;  Surgeon: Yetta Flock, MD;  Location: [place];  Service: Gastroenterology;  Laterality: N/A;   ESOPHAGOGASTRODUODENOSCOPY (EGD) WITH PROPOFOL N/A 06/02/2018   Procedure: ESOPHAGOGASTRODUODENOSCOPY (EGD) WITH PROPOFOL;  Surgeon: Doran Stabler, MD;  Location: [place];   Service: Gastroenterology;  Laterality: N/A;   ESOPHAGOGASTRODUODENOSCOPY (EGD) WITH PROPOFOL N/A 07/07/2018   Procedure: ESOPHAGOGASTRODUODENOSCOPY (EGD) WITH PROPOFOL;  Surgeon: Yetta Flock, MD;  Location: [facility];  Service: Gastroenterology;  Laterality: N/A;   ESOPHAGOGASTRODUODENOSCOPY (EGD) WITH PROPOFOL N/A 07/27/2018   Procedure: ESOPHAGOGASTRODUODENOSCOPY (EGD) WITH PROPOFOL;  Surgeon: Yetta Flock, MD;  Location: [facility];  Service: Gastroenterology;  Laterality: N/A;   ESOPHAGOGASTRODUODENOSCOPY (EGD) WITH PROPOFOL N/A 08/30/2018   Procedure: ESOPHAGOGASTRODUODENOSCOPY (EGD) WITH PROPOFOL;  Surgeon: Yetta Flock, MD;  Location: [facility];  Service: Gastroenterology;  Laterality: N/A;   ESOPHAGOGASTRODUODENOSCOPY (EGD) WITH PROPOFOL N/A 11/08/2018   Procedure: ESOPHAGOGASTRODUODENOSCOPY (EGD) WITH PROPOFOL;  Surgeon: Yetta Flock, MD;  Location: [facility];  Service: Gastroenterology;  Laterality: N/A;   ESOPHAGOGASTRODUODENOSCOPY (EGD) WITH PROPOFOL N/A 01/31/2019   Procedure: ESOPHAGOGASTRODUODENOSCOPY (EGD) WITH PROPOFOL;  Surgeon: Yetta Flock, MD;  Location: [facility];  Service: Gastroenterology;  Laterality: N/A;   ESOPHAGOGASTRODUODENOSCOPY (EGD) WITH PROPOFOL N/A 08/01/2019   Procedure: ESOPHAGOGASTRODUODENOSCOPY (EGD) WITH PROPOFOL;  Surgeon: Yetta Flock, MD;  Location: [facility];  Service: Gastroenterology;  Laterality: N/A;   ESOPHAGOGASTRODUODENOSCOPY (EGD) WITH PROPOFOL N/A 08/07/2020   Procedure: ESOPHAGOGASTRODUODENOSCOPY (EGD) WITH PROPOFOL;  Surgeon: Yetta Flock, MD;  Location: [facility];  Service: Gastroenterology;  Laterality: N/A;   IR PARACENTESIS  04/26/2018   IR PARACENTESIS  05/18/2018   IR PARACENTESIS  05/31/2018   IR PARACENTESIS  06/30/2018   IR PARACENTESIS  08/05/2018   IR PARACENTESIS  08/31/2018   IR PARACENTESIS  10/08/2018   IR PARACENTESIS  10/28/2018   IR PARACENTESIS  02/14/2019   IR  PARACENTESIS  03/09/2019   IR PARACENTESIS  04/05/2019   IR PARACENTESIS  05/03/2019   IR PARACENTESIS  06/20/2019   IR PARACENTESIS  07/19/2019   IR PARACENTESIS  08/19/2019   IR PARACENTESIS  09/27/2019   IR PARACENTESIS  10/07/2019   IR PARACENTESIS  10/28/2019   IR PARACENTESIS  11/16/2019   IR PARACENTESIS  12/30/2019   IR PARACENTESIS  01/16/2020   IR PARACENTESIS  02/07/2020   IR PARACENTESIS  03/06/2020   IR PARACENTESIS  04/04/2020   IR PARACENTESIS  04/17/2020   IR PARACENTESIS  05/04/2020   IR PARACENTESIS  06/07/2020   IR PARACENTESIS  06/28/2020   IR PARACENTESIS  07/27/2020   IR PARACENTESIS  08/20/2020   IR PARACENTESIS  09/13/2020   IR PARACENTESIS  10/04/2020   IR PARACENTESIS  10/25/2020   IR PARACENTESIS  11/07/2020   IR PARACENTESIS  11/30/2020   IR PARACENTESIS  12/13/2020   IR PARACENTESIS  01/04/2021   IR PARACENTESIS  01/25/2021   IR PARACENTESIS  02/20/2021   IR PARACENTESIS  03/20/2021   IR PARACENTESIS  04/18/2021   IR PARACENTESIS  05/14/2021   IR PARACENTESIS  06/10/2021   IR PARACENTESIS  06/27/2021   IR PARACENTESIS  07/17/2021   IR RADIOLOGIST EVAL & MGMT  12/23/2018   IR THORACENTESIS ASP PLEURAL SPACE W/IMG GUIDE  12/10/2018   IR THORACENTESIS ASP PLEURAL SPACE W/IMG GUIDE  01/21/2019   Family History  Problem Relation Age of Onset   Colon cancer Neg Hx    Esophageal cancer Neg Hx    Pancreatic cancer Neg Hx    Stomach cancer Neg Hx    Social History   Tobacco Use   Smoking status: Some Days    Packs/day: 0.25    Years: 20.00    Pack years: 5.00    Types: Cigarettes   Smokeless tobacco: Never   Tobacco comments:    5 a day  Vaping Use   Vaping Use: Never used  Substance Use Topics   Alcohol use: Not Currently   Drug use: Not Currently   Current Outpatient Medications  Medication Sig Dispense Refill   furosemide (LASIX) 20 MG tablet Take 1 tablet (20 mg total) by mouth 2 (two) times daily. 60 tablet 3   spironolactone (ALDACTONE) 100 MG tablet Take 1  tablet (100 mg total) by mouth daily. 90 tablet 1   No current facility-administered medications for this visit.   Facility-Administered Medications Ordered in Other Visits  Medication Dose Route Frequency Provider Last Rate Last Admin   lactated ringers infusion    Continuous PRN Leonor Liv, CRNA   New Bag at 03/09/19 0800   lactated ringers infusion    Continuous PRN Leonor Liv, CRNA   New Bag at 03/09/19 1624   Allergies  Allergen Reactions   Contrast Media [Iodinated Contrast Media] Itching and Swelling    Needs 13-hr prep   Latex Itching, Swelling and Other (See Comments)    No breathing impairment, however   Sulfa Antibiotics Itching, Swelling and Other (See Comments)    No breathing impairment, however  Review of Systems: All systems reviewed and negative except where noted in HPI.    IR Paracentesis  Result Date: 07/17/2021 INDICATION: Patient with history of cirrhosis and recurrent large volume ascites. Request for IR to perform therapeutic paracentesis up to 10 L. EXAM: ULTRASOUND GUIDED THERAPEUTIC RIGHT LOWER QUADRANT PARACENTESIS MEDICATIONS: 10 mL 1 % lidocaine COMPLICATIONS: None immediate. PROCEDURE: Informed written consent was obtained from the patient after a discussion of the risks, benefits and alternatives to treatment. A timeout was performed prior to the initiation of the procedure. Initial ultrasound scanning demonstrates a large amount of ascites within the right lower abdominal quadrant. The right lower abdomen was prepped and draped in the usual sterile fashion. 1% lidocaine was used for local anesthesia. Following this, a 19 gauge, 10-cm, Yueh catheter was introduced. An ultrasound image was saved for documentation purposes. The paracentesis was performed. The catheter was removed and a dressing was applied. The patient tolerated the procedure well without immediate post procedural complication. Patient received post-procedure intravenous albumin; see  nursing notes for details. FINDINGS: A total of approximately 10 L of clear, yellow fluid was removed. IMPRESSION: Successful ultrasound-guided paracentesis yielding 10 liters of peritoneal fluid. Read by: Narda Rutherford, AGNP-BC Electronically Signed   By: Miachel Roux M.D.   On: 07/17/2021 15:12    Lab Results  Component Value Date   WBC 3.5 (L) 04/04/2021   HGB 9.7 (L) 04/04/2021   HCT 30.4 (L) 04/04/2021   MCV 71.6 (L) 04/04/2021   PLT 178.0 04/04/2021    Lab Results  Component Value Date   INR 1.3 (H) 04/04/2021   INR 1.2 (H) 12/07/2020   INR 1.5 (H) 07/19/2020    Lab Results  Component Value Date   CREATININE 0.61 05/10/2021   BUN 11 05/10/2021   NA 141 05/10/2021   K 3.8 05/10/2021   CL 109 05/10/2021   CO2 25 05/10/2021   Lab Results  Component Value Date   ALT 14 04/04/2021   AST 34 04/04/2021   ALKPHOS 147 (H) 04/04/2021   BILITOT 1.2 04/04/2021     Physical Exam: BP 120/70    Pulse (!) 103    Ht 5\' 4"  (1.626 m)    Wt 154 lb (69.9 kg)    SpO2 93%    BMI 26.43 kg/m  Constitutional: Pleasant, female in no acute distress. Abdominal: Soft, distended with ascites, nontender. There are no masses palpable.  Extremities: no edema in LE Lymphadenopathy: No cervical adenopathy noted. Neurological: Alert and oriented to person place and time. Skin: Skin is warm and dry. No rashes noted. Psychiatric: Normal mood and affect. Behavior is normal.   ASSESSMENT AND PLAN: 56 year old female here for reassessment of the following:  Alcoholic cirrhosis with ascites Esophageal varices  Extensive history as outlined above, persistent ascites at this point time is the major issue that has been bothering her.  Previously was not compliant with alcohol abstinence or her diuretic regimen and lab draws.  Over the past several months she has been compliant with both of these and doing a good job from her report, however ascites remains persistent.  She does not have much lower  extremity edema today which is fortunate.  We discussed options moving forward.  She does have room to increase her diuretic dosing we will plan on doing that if her renal function is stable.  We discussed how much fluid she is drinking at home, does not necessarily need fluid restriction but only drink fluids when thirsty.  Continue to follow low sodium diet.  She strongly wishes to pursue a TIPS evaluation given her course to date.  Given she has been compliant with measures we discussed over the past several months I think that is reasonable at this point time.  We will refer her to IR for that.  She will need an echocardiogram to make sure her cardiac function is stable.  We discussed risks of TIPS to include fluid shifting, decompensation, hepatic encephalopathy etc., she understands this and wishes to discuss it further with radiology.  She needs to continue to be abstinent from alcohol and compliant with her medications.  She is due for surveillance EGD with me next month at the hospital.  She is scheduled for paracentesis on Thursday, will continue with scheduled paracentesis for relief of symptoms until they are either better controlled with diuretics or TIPS if she undergoes that in the future.  Plan: - labs today done and pending. If renal function stable on present dosing of diuretics we will plan to increase Aldactone to 200 mg daily and Lasix to 40 mg twice daily. - she will need close monitoring on higher dose diuretics if she is pursues this, will repeat BMET in 1 week if we increase diuretic dosing - refer to IR for TIPS evaluation - refer to cardiology for echocardiogram in preparation for possible TIPS - schedule surveillance right upper quadrant ultrasound - EGD at the hospital in March for varices surveillance - continued alcohol abstinence, low Na diet - continue to follow-up every 6 months  Jolly Mango, MD [facility]

## 2021-08-05 NOTE — Addendum Note (Signed)
Addended by: Roetta Sessions on: 08/05/2021 12:01 PM   Modules accepted: Orders

## 2021-08-06 LAB — IBC + FERRITIN
Ferritin: 12.4 ng/mL (ref 10.0–291.0)
Iron: 16 ug/dL — ABNORMAL LOW (ref 42–145)
Saturation Ratios: 5.7 % — ABNORMAL LOW (ref 20.0–50.0)
TIBC: 280 ug/dL (ref 250.0–450.0)
Transferrin: 200 mg/dL — ABNORMAL LOW (ref 212.0–360.0)

## 2021-08-06 LAB — BASIC METABOLIC PANEL
BUN: 8 mg/dL (ref 6–23)
CO2: 26 mEq/L (ref 19–32)
Calcium: 8.8 mg/dL (ref 8.4–10.5)
Chloride: 107 mEq/L (ref 96–112)
Creatinine, Ser: 0.61 mg/dL (ref 0.40–1.20)
GFR: 99.82 mL/min (ref >60.00)
Glucose, Bld: 90 mg/dL (ref 70–99)
Potassium: 4 mEq/L (ref 3.5–5.1)
Sodium: 139 mEq/L (ref 135–145)

## 2021-08-06 LAB — AFP TUMOR MARKER: AFP-Tumor Marker: 1.4 ng/mL

## 2021-08-06 NOTE — Telephone Encounter (Signed)
Echocardiogram scheduled for 2-21

## 2021-08-08 ENCOUNTER — Other Ambulatory Visit: Payer: Self-pay

## 2021-08-08 ENCOUNTER — Other Ambulatory Visit (HOSPITAL_COMMUNITY): Payer: Medicare HMO

## 2021-08-08 DIAGNOSIS — K7031 Alcoholic cirrhosis of liver with ascites: Secondary | ICD-10-CM

## 2021-08-08 MED ORDER — FERROUS SULFATE 325 (65 FE) MG PO TBEC: 325.0000 mg | DELAYED_RELEASE_TABLET | Freq: Every day | ORAL | 0 refills | Status: DC

## 2021-08-08 MED ORDER — FUROSEMIDE 20 MG PO TABS: 40.0000 mg | ORAL_TABLET | Freq: Two times a day (BID) | ORAL | 0 refills | Status: DC

## 2021-08-08 MED ORDER — SPIRONOLACTONE 100 MG PO TABS: 200.0000 mg | ORAL_TABLET | Freq: Every day | ORAL | 0 refills | Status: DC

## 2021-08-08 MED ORDER — VITAMIN B-12 1000 MCG PO TABS: 1000.0000 ug | ORAL_TABLET | Freq: Every day | ORAL | 0 refills | Status: DC

## 2021-08-12 ENCOUNTER — Ambulatory Visit (HOSPITAL_COMMUNITY)
Admission: RE | Admit: 2021-08-12 | Discharge: 2021-08-12 | Disposition: A | Discharge status: Home / Self Care | Payer: Medicare HMO | Source: Ambulatory Visit | Attending: Gastroenterology | Admitting: Gastroenterology

## 2021-08-12 ENCOUNTER — Other Ambulatory Visit: Payer: Self-pay

## 2021-08-12 ENCOUNTER — Ambulatory Visit (HOSPITAL_COMMUNITY): Payer: Medicare HMO

## 2021-08-12 DIAGNOSIS — K7031 Alcoholic cirrhosis of liver with ascites: Secondary | ICD-10-CM

## 2021-08-12 DIAGNOSIS — K7460 Unspecified cirrhosis of liver: Secondary | ICD-10-CM | POA: Diagnosis not present

## 2021-08-12 DIAGNOSIS — I8500 Esophageal varices without bleeding: Secondary | ICD-10-CM | POA: Diagnosis present

## 2021-08-12 DIAGNOSIS — R188 Other ascites: Secondary | ICD-10-CM | POA: Insufficient documentation

## 2021-08-12 DIAGNOSIS — R609 Edema, unspecified: Secondary | ICD-10-CM | POA: Insufficient documentation

## 2021-08-12 DIAGNOSIS — R69 Illness, unspecified: Secondary | ICD-10-CM | POA: Diagnosis not present

## 2021-08-12 DIAGNOSIS — J90 Pleural effusion, not elsewhere classified: Secondary | ICD-10-CM | POA: Diagnosis not present

## 2021-08-12 HISTORY — PX: IR PARACENTESIS: IMG2679

## 2021-08-12 MED ORDER — ALBUMIN HUMAN 25 % IV SOLN
INTRAVENOUS | Status: AC
  Administered 2021-08-12: 50 g via INTRAVENOUS
  Filled 2021-08-12: qty 200

## 2021-08-12 MED ORDER — LIDOCAINE HCL 1 % IJ SOLN
INTRAMUSCULAR | Status: AC
  Administered 2021-08-12: 12 mL
  Filled 2021-08-12: qty 20

## 2021-08-12 MED ORDER — ALBUMIN HUMAN 25 % IV SOLN
50.0000 g | Freq: Once | INTRAVENOUS | Status: AC
Start: 2021-08-12 — End: 2021-08-12
  Filled 2021-08-12: qty 200

## 2021-08-13 ENCOUNTER — Encounter (HOSPITAL_COMMUNITY): Payer: Self-pay | Admitting: Gastroenterology

## 2021-08-13 ENCOUNTER — Ambulatory Visit (HOSPITAL_COMMUNITY): Payer: Medicare HMO | Attending: Gastroenterology

## 2021-08-15 ENCOUNTER — Telehealth: Payer: Self-pay

## 2021-08-15 NOTE — Telephone Encounter (Signed)
-----   Message from Roetta Sessions, Bertie sent at 08/13/2021  1:49 PM EST ----- Regarding: ECHO Did patient reschedule her echo?

## 2021-08-15 NOTE — Telephone Encounter (Signed)
-----   Message from Yevette Edwards, RN sent at 08/08/2021  2:50 PM EST ----- Regarding: Labs BMET due on 2/24 or 2/27 Order is in epic

## 2021-08-15 NOTE — Telephone Encounter (Signed)
Spoke with patient to remind her that she is due for repeat labs at this time. No appointment is necessary. Patient is aware that she can stop by the lab in the basement at her convenience between 7:30 AM - 5 PM, tomorrow or Monday. Pt asked that I give her a call on Monday to remind her. Patient verbalized understanding and had no concerns at the end of the call.

## 2021-08-15 NOTE — Telephone Encounter (Signed)
Patient missed her ECHO appointment with Cardiology; she had her dates confused.  Called Cardiology. They will call patient to get her rescheduled

## 2021-08-19 NOTE — Telephone Encounter (Signed)
Called to remind pt to come in for labs today. She told me that it was too short notice and that she didn't have a ride so she couldn't come in today, but she would come tomorrow morning. I told her that I called her on Friday to remind her, she stated that she did not get that call. I told her that she will need to come by the lab tomorrow morning. Pt verbalized understanding and had no concerns at the end of the call.

## 2021-08-20 ENCOUNTER — Other Ambulatory Visit (INDEPENDENT_AMBULATORY_CARE_PROVIDER_SITE_OTHER): Payer: Medicare HMO

## 2021-08-20 DIAGNOSIS — K7031 Alcoholic cirrhosis of liver with ascites: Secondary | ICD-10-CM | POA: Diagnosis not present

## 2021-08-20 DIAGNOSIS — R69 Illness, unspecified: Secondary | ICD-10-CM | POA: Diagnosis not present

## 2021-08-20 LAB — BASIC METABOLIC PANEL
BUN: 18 mg/dL (ref 6–23)
CO2: 24 mEq/L (ref 19–32)
Calcium: 8.8 mg/dL (ref 8.4–10.5)
Chloride: 104 mEq/L (ref 96–112)
Creatinine, Ser: 0.6 mg/dL (ref 0.40–1.20)
GFR: 100.19 mL/min (ref >60.00)
Glucose, Bld: 104 mg/dL — ABNORMAL HIGH (ref 70–99)
Potassium: 3.6 mEq/L (ref 3.5–5.1)
Sodium: 135 mEq/L (ref 135–145)

## 2021-08-21 ENCOUNTER — Other Ambulatory Visit: Payer: Self-pay

## 2021-08-21 ENCOUNTER — Encounter (HOSPITAL_COMMUNITY): Payer: Self-pay | Admitting: Gastroenterology

## 2021-08-21 DIAGNOSIS — K7031 Alcoholic cirrhosis of liver with ascites: Secondary | ICD-10-CM

## 2021-08-22 ENCOUNTER — Ambulatory Visit (HOSPITAL_BASED_OUTPATIENT_CLINIC_OR_DEPARTMENT_OTHER): Payer: Medicare HMO | Admitting: Anesthesiology

## 2021-08-22 ENCOUNTER — Other Ambulatory Visit (HOSPITAL_COMMUNITY): Payer: Medicare HMO

## 2021-08-22 ENCOUNTER — Other Ambulatory Visit: Payer: Self-pay

## 2021-08-22 ENCOUNTER — Ambulatory Visit (HOSPITAL_COMMUNITY): Payer: Medicare HMO | Admitting: Anesthesiology

## 2021-08-22 ENCOUNTER — Encounter (HOSPITAL_COMMUNITY): Payer: Self-pay | Admitting: Gastroenterology

## 2021-08-22 ENCOUNTER — Encounter (HOSPITAL_COMMUNITY)
Admission: RE | Disposition: A | Discharge status: Home / Self Care | Payer: Self-pay | Source: Home / Self Care | Attending: Gastroenterology

## 2021-08-22 ENCOUNTER — Ambulatory Visit (HOSPITAL_COMMUNITY)
Admission: RE | Admit: 2021-08-22 | Discharge: 2021-08-22 | Disposition: A | Discharge status: Home / Self Care | Payer: Medicare HMO | Attending: Gastroenterology | Admitting: Gastroenterology

## 2021-08-22 DIAGNOSIS — I8500 Esophageal varices without bleeding: Secondary | ICD-10-CM

## 2021-08-22 DIAGNOSIS — I8510 Secondary esophageal varices without bleeding: Secondary | ICD-10-CM

## 2021-08-22 DIAGNOSIS — Z79899 Other long term (current) drug therapy: Secondary | ICD-10-CM | POA: Diagnosis not present

## 2021-08-22 DIAGNOSIS — K2289 Other specified disease of esophagus: Secondary | ICD-10-CM | POA: Diagnosis not present

## 2021-08-22 DIAGNOSIS — F17210 Nicotine dependence, cigarettes, uncomplicated: Secondary | ICD-10-CM | POA: Diagnosis not present

## 2021-08-22 DIAGNOSIS — K7031 Alcoholic cirrhosis of liver with ascites: Secondary | ICD-10-CM | POA: Insufficient documentation

## 2021-08-22 DIAGNOSIS — K7460 Unspecified cirrhosis of liver: Secondary | ICD-10-CM

## 2021-08-22 DIAGNOSIS — R69 Illness, unspecified: Secondary | ICD-10-CM | POA: Diagnosis not present

## 2021-08-22 DIAGNOSIS — T182XXA Foreign body in stomach, initial encounter: Secondary | ICD-10-CM | POA: Diagnosis not present

## 2021-08-22 DIAGNOSIS — Z09 Encounter for follow-up examination after completed treatment for conditions other than malignant neoplasm: Secondary | ICD-10-CM | POA: Diagnosis not present

## 2021-08-22 DIAGNOSIS — R609 Edema, unspecified: Secondary | ICD-10-CM

## 2021-08-22 HISTORY — PX: ESOPHAGOGASTRODUODENOSCOPY (EGD) WITH PROPOFOL: SHX5813

## 2021-08-22 SURGERY — ESOPHAGOGASTRODUODENOSCOPY (EGD) WITH PROPOFOL
Anesthesia: Monitor Anesthesia Care

## 2021-08-22 MED ORDER — PROPOFOL 1000 MG/100ML IV EMUL
INTRAVENOUS | Status: AC
  Filled 2021-08-22: qty 200

## 2021-08-22 MED ORDER — PROPOFOL 500 MG/50ML IV EMUL
INTRAVENOUS | Status: DC | PRN
  Administered 2021-08-22: 150 ug/kg/min via INTRAVENOUS

## 2021-08-22 MED ORDER — LACTATED RINGERS IV SOLN: INTRAVENOUS | Status: DC

## 2021-08-22 MED ORDER — PROPOFOL 500 MG/50ML IV EMUL
INTRAVENOUS | Status: DC | PRN
  Administered 2021-08-22: 50 mg via INTRAVENOUS

## 2021-08-22 MED ORDER — PROPOFOL 500 MG/50ML IV EMUL
INTRAVENOUS | Status: AC
  Filled 2021-08-22: qty 100

## 2021-08-22 MED ORDER — LACTATED RINGERS IV SOLN
INTRAVENOUS | Status: DC | PRN
  Administered 2021-08-22: 1000 mL via INTRAVENOUS

## 2021-08-22 MED ORDER — SODIUM CHLORIDE 0.9 % IV SOLN: INTRAVENOUS | Status: DC

## 2021-08-22 SURGICAL SUPPLY — 15 items

## 2021-08-22 NOTE — Discharge Instructions (Signed)
YOU HAD AN ENDOSCOPIC PROCEDURE TODAY: Refer to the procedure report and other information in the discharge instructions given to you for any specific questions about what was found during the examination. If this information does not answer your questions, please call [place] office at [phone] to clarify.   YOU SHOULD EXPECT: Some feelings of bloating in the abdomen. Passage of more gas than usual. Walking can help get rid of the air that was put into your GI tract during the procedure and reduce the bloating. If you had a lower endoscopy (such as a colonoscopy or flexible sigmoidoscopy) you may notice spotting of blood in your stool or on the toilet paper. Some abdominal soreness may be present for a day or two, also.  DIET: Your first meal following the procedure should be a light meal and then it is ok to progress to your normal diet. A half-sandwich or bowl of soup is an example of a good first meal. Heavy or fried foods are harder to digest and may make you feel nauseous or bloated. Drink plenty of fluids but you should avoid alcoholic beverages for 24 hours. If you had a esophageal dilation, please see attached instructions for diet.    ACTIVITY: Your care partner should take you home directly after the procedure. You should plan to take it easy, moving slowly for the rest of the day. You can resume normal activity the day after the procedure however YOU SHOULD NOT DRIVE, use power tools, machinery or perform tasks that involve climbing or major physical exertion for 24 hours (because of the sedation medicines used during the test).   SYMPTOMS TO REPORT IMMEDIATELY: A gastroenterologist can be reached at any hour. Please call [phone]  for any of the following symptoms:   Following upper endoscopy (EGD, EUS, ERCP, esophageal dilation) Vomiting of blood or coffee ground material  New, significant abdominal pain  New, significant chest pain or pain under the shoulder blades  Painful or  persistently difficult swallowing  New shortness of breath  Black, tarry-looking or red, bloody stools  FOLLOW UP:  If any biopsies were taken you will be contacted by phone or by letter within the next 1-3 weeks. Call [phone]  if you have not heard about the biopsies in 3 weeks.  Please also call with any specific questions about appointments or follow up tests.  

## 2021-08-22 NOTE — Anesthesia Preprocedure Evaluation (Signed)
Anesthesia Evaluation  ?Patient identified by MRN, date of birth, ID band ?Patient awake ? ? ? ?Reviewed: ?Allergy & Precautions, NPO status , Patient's Chart, lab work & pertinent test results ? ?Airway ?Mallampati: II ? ?TM Distance: >3 FB ?Neck ROM: Full ? ? ? Dental ?no notable dental hx. ? ?  ?Pulmonary ?neg pulmonary ROS, Current Smoker and Patient abstained from smoking.,  ?  ?Pulmonary exam normal ?breath sounds clear to auscultation ? ? ? ? ? ? Cardiovascular ?negative cardio ROS ?Normal cardiovascular exam ?Rhythm:Regular Rate:Normal ? ? ?  ?Neuro/Psych ?negative neurological ROS ? negative psych ROS  ? GI/Hepatic ?GERD  ,(+) Cirrhosis  ? Esophageal Varices and ascites ? substance abuse ? alcohol use,   ?Endo/Other  ?negative endocrine ROS ? Renal/GU ?negative Renal ROS  ?negative genitourinary ?  ?Musculoskeletal ?negative musculoskeletal ROS ?(+)  ? Abdominal ?  ?Peds ?negative pediatric ROS ?(+)  Hematology ?negative hematology ROS ?(+)   ?Anesthesia Other Findings ? ? Reproductive/Obstetrics ?negative OB ROS ? ?  ? ? ? ? ? ? ? ? ? ? ? ? ? ?  ?  ? ? ? ? ? ? ? ? ?Anesthesia Physical ?Anesthesia Plan ? ?ASA: 4 ? ?Anesthesia Plan: MAC  ? ?Post-op Pain Management: Minimal or no pain anticipated  ? ?Induction: Intravenous ? ?PONV Risk Score and Plan: 2 and Propofol infusion and Treatment may vary due to age or medical condition ? ?Airway Management Planned: Simple Face Mask ? ?Additional Equipment:  ? ?Intra-op Plan:  ? ?Post-operative Plan:  ? ?Informed Consent: I have reviewed the patients History and Physical, chart, labs and discussed the procedure including the risks, benefits and alternatives for the proposed anesthesia with the patient or authorized representative who has indicated his/her understanding and acceptance.  ? ? ? ?Dental advisory given ? ?Plan Discussed with: CRNA and Surgeon ? ?Anesthesia Plan Comments:   ? ? ? ? ? ? ?Anesthesia Quick Evaluation ? ?

## 2021-08-22 NOTE — Op Note (Signed)
[facility] ?Patient Name: Katie Holland ?Procedure Date: 08/22/2021 ?MRN: [id number] ?Attending MD: Carlota Raspberry. Havery Moros , MD ?Date of Birth: [birth year] ?CSN: [id number] ?Age: 56 ?Admit Type: Outpatient ?Procedure:                Upper GI endoscopy ?Indications:              Follow-up of esophageal varices - history of  ?                          bleeding varices, s/p 25 bands placed for  ?                          eradication in years past with eradication. Last  ?                          exam 1 year ago did not require any banding.  ?                          Refractory ascites, not on beta blockade ?Providers:                Carlota Raspberry. Havery Moros, MD, Ladoris Gene, RN,  ?                          William Dalton, Technician ?Referring MD:              ?Medicines:                Monitored Anesthesia Care ?Complications:            No immediate complications. Estimated blood loss:  ?                          None. ?Estimated Blood Loss:     Estimated blood loss: none. ?Procedure:                Pre-Anesthesia Assessment: ?                          - Prior to the procedure, a History and Physical  ?                          was performed, and patient medications and  ?                          allergies were reviewed. The patient's tolerance of  ?                          previous anesthesia was also reviewed. The risks  ?                          and benefits of the procedure and the sedation  ?                          options and risks were discussed with the patient.  ?  All questions were answered, and informed consent  ?                          was obtained. Prior Anticoagulants: The patient has  ?                          taken no previous anticoagulant or antiplatelet  ?                          agents. ASA Grade Assessment: IV - A patient with  ?                          severe systemic disease that is a constant threat  ?                          to life. After  reviewing the risks and benefits,  ?                          the patient was deemed in satisfactory condition to  ?                          undergo the procedure. ?                          After obtaining informed consent, the endoscope was  ?                          passed under direct vision. Throughout the  ?                          procedure, the patient's blood pressure, pulse, and  ?                          oxygen saturations were monitored continuously. The  ?                          GIF-H190 ([id number]) Olympus endoscope was introduced  ?                          through the mouth, and advanced to the body of the  ?                          stomach. The upper GI endoscopy was accomplished  ?                          without difficulty. The patient tolerated the  ?                          procedure well. ?Scope In: ?Scope Out: ?Findings: ?     Esophagogastric landmarks were identified: the Z-line was found at 38  ?     cm, the gastroesophageal junction was found at 38 cm and the upper  ?     extent of the gastric folds was found at 38 cm from  the incisors. ?     Trace varices were found in the lower third of the esophagus without any  ?     stigmata for bleeding. . ?     There were multiple areas of scarring from prior bandings. The exam of  ?     the esophagus was otherwise normal. ?     A large amount of food (residue) was found in the gastric fundus and in  ?     the gastric body. Once this was seen the procedure was aborted due to  ?     aspiration risk and full exam not performed. ?Impression:               - Esophagogastric landmarks identified. ?                          - Trace esophageal varices in the lower esophagus  ?                          without any high risk stigmata. ?                          - Multiple areas of scarring from prior banding ?                          - A large amount of food (residue) in the stomach,  ?                          exam of the stomach and duodenum not  performed once  ?                          this was seen and procedure stopped. ?                          Overall, no significant change from the last exam,  ?                          no significant varices to warrant banding. ?Moderate Sedation: ?     No moderate sedation, case performed with MAC ?Recommendation:           - Patient has a contact number available for  ?                          emergencies. The signs and symptoms of potential  ?                          delayed complications were discussed with the  ?                          patient. Return to normal activities tomorrow.  ?                          Written discharge instructions were provided to the  ?                          patient. ?                          -  Resume previous diet. ?                          - Continue present medications. ?                          - Repeat upper endoscopy in 1 year for surveillance  ?                          if the patient does not have TIPS in the interim  ?                          (seeing radiology for possible TIPS evaluation in  ?                          upcoming months for refractory ascites) ?Procedure Code(s):        --- Professional --- ?                          04888, 52, Esophagogastroduodenoscopy, flexible,  ?                          transoral; diagnostic, including collection of  ?                          specimen(s) by brushing or washing, when performed  ?                          (separate procedure) ?Diagnosis Code(s):        --- Professional --- ?                          I85.00, Esophageal varices without bleeding ?CPT copyright 2019 American Medical Association. All rights reserved. ?The codes documented in this report are preliminary and upon coder review may  ?be revised to meet current compliance requirements. ?Carlota Raspberry. Suhaila Troiano, MD ?08/22/2021 7:47:07 AM ?This report has been signed electronically. ?Number of Addenda: 0 ?

## 2021-08-22 NOTE — Transfer of Care (Signed)
Immediate Anesthesia Transfer of Care Note ? ?Patient: Darsha Zumstein ? ?Procedure(s) Performed: Procedure(s): ?ESOPHAGOGASTRODUODENOSCOPY (EGD) WITH PROPOFOL (N/A) ?ESOPHAGEAL BANDING (N/A) ? ?Patient Location: PACU ? ?Anesthesia Type:MAC ? ?Level of Consciousness:  sedated, patient cooperative and responds to stimulation ? ?Airway & Oxygen Therapy:Patient Spontanous Breathing and Patient connected to face mask oxgen ? ?Post-op Assessment:  Report given to PACU RN and Post -op Vital signs reviewed and stable ? ?Post vital signs:  Reviewed and stable ? ?Last Vitals:  ?Vitals:  ? 08/22/21 0715  ?BP: (!) 149/91  ?Pulse: 92  ?Resp: (!) 21  ?Temp: 36.7 ?C  ?SpO2: 94%  ? ? ?Complications: No apparent anesthesia complications ? ?

## 2021-08-22 NOTE — Interval H&P Note (Signed)
History and Physical Interval Note: ?No interval changes since I have last seen the past within the past month. Ongoing issues with ascites. Here for EGD to survey esophageal varices and treat with banding as needed. I have discussed risks / benefits with her and she understands. ?Her exam is unchanged. She has mildly decreased BS in right base, otherwise clear. CV RRR. Abdomen remains distended with ascites. Discussed risks / benefits and she wishes to proceed. All questions answered.  ? ? ?08/22/2021 ?7:22 AM ? ?Katie Holland  has presented today for surgery, with the diagnosis of alcoholic cirrhosis, esophageal varices, edema.  The various methods of treatment have been discussed with the patient and family. After consideration of risks, benefits and other options for treatment, the patient has consented to  Procedure(s): ?ESOPHAGOGASTRODUODENOSCOPY (EGD) WITH PROPOFOL (N/A) ?ESOPHAGEAL BANDING (N/A) as a surgical intervention.  The patient's history has been reviewed, patient examined, no change in status, stable for surgery.  I have reviewed the patient's chart and labs.  Questions were answered to the patient's satisfaction.   ? ? ?Katie Holland ? ? ?

## 2021-08-22 NOTE — Anesthesia Postprocedure Evaluation (Signed)
Anesthesia Post Note ? ?Patient: Katie Holland ? ?Procedure(s) Performed: ESOPHAGOGASTRODUODENOSCOPY (EGD) WITH PROPOFOL ?ESOPHAGEAL BANDING ? ?  ? ?Patient location during evaluation: PACU ?Anesthesia Type: MAC ?Level of consciousness: awake and alert ?Pain management: pain level controlled ?Vital Signs Assessment: post-procedure vital signs reviewed and stable ?Respiratory status: spontaneous breathing, nonlabored ventilation, respiratory function stable and patient connected to nasal cannula oxygen ?Cardiovascular status: stable and blood pressure returned to baseline ?Postop Assessment: no apparent nausea or vomiting ?Anesthetic complications: no ? ? ?No notable events documented. ? ?Last Vitals:  ?Vitals:  ? 08/22/21 0752 08/22/21 0800  ?BP: 137/88 (!) 149/96  ?Pulse: 100 98  ?Resp: (!) 30 (!) 23  ?Temp:    ?SpO2: 92% 94%  ?  ?Last Pain:  ?Vitals:  ? 08/22/21 0800  ?TempSrc:   ?PainSc: 0-No pain  ? ? ?  ?  ?  ?  ?  ?  ? ?Chesley Valls S ? ? ? ? ?

## 2021-08-23 ENCOUNTER — Ambulatory Visit (HOSPITAL_COMMUNITY)
Admission: RE | Admit: 2021-08-23 | Discharge: 2021-08-23 | Disposition: A | Discharge status: Home / Self Care | Payer: Medicare HMO | Source: Ambulatory Visit | Attending: Gastroenterology | Admitting: Gastroenterology

## 2021-08-23 DIAGNOSIS — K7031 Alcoholic cirrhosis of liver with ascites: Secondary | ICD-10-CM | POA: Diagnosis not present

## 2021-08-23 DIAGNOSIS — R188 Other ascites: Secondary | ICD-10-CM | POA: Diagnosis not present

## 2021-08-23 DIAGNOSIS — K7460 Unspecified cirrhosis of liver: Secondary | ICD-10-CM | POA: Diagnosis not present

## 2021-08-23 DIAGNOSIS — R69 Illness, unspecified: Secondary | ICD-10-CM | POA: Diagnosis not present

## 2021-08-23 HISTORY — PX: IR PARACENTESIS: IMG2679

## 2021-08-23 MED ORDER — ALBUMIN HUMAN 25 % IV SOLN
INTRAVENOUS | Status: AC
  Filled 2021-08-23: qty 100

## 2021-08-23 MED ORDER — ALBUMIN HUMAN 25 % IV SOLN
50.0000 g | Freq: Once | INTRAVENOUS | Status: AC
  Administered 2021-08-23: 50 g via INTRAVENOUS

## 2021-08-23 MED ORDER — LIDOCAINE HCL 1 % IJ SOLN
INTRAMUSCULAR | Status: AC
  Filled 2021-08-23: qty 20

## 2021-08-23 MED ORDER — ALBUMIN HUMAN 25 % IV SOLN
INTRAVENOUS | Status: AC
  Administered 2021-08-23: 25 g via INTRAVENOUS
  Filled 2021-08-23: qty 200

## 2021-08-23 MED ORDER — ALBUMIN HUMAN 25 % IV SOLN: 25.0000 g | Freq: Once | INTRAVENOUS | Status: AC

## 2021-08-23 MED ORDER — LIDOCAINE HCL (PF) 1 % IJ SOLN
INTRAMUSCULAR | Status: DC | PRN
  Administered 2021-08-23: 20 mL

## 2021-08-23 NOTE — Procedures (Signed)
PROCEDURE SUMMARY: ? ?Successful US guided paracentesis from right lateral abdomen.  ?Yielded 10.0 liters of yellow fluid.  ?No immediate complications.  ?Pt tolerated well.  ? ?Specimen was not sent for labs. ? ?EBL < 5mL ? ?Docia Barrier PA-C ?08/23/2021 ?3:53 PM ? ? ? ?

## 2021-08-25 ENCOUNTER — Encounter (HOSPITAL_COMMUNITY): Payer: Self-pay | Admitting: Gastroenterology

## 2021-09-05 ENCOUNTER — Telehealth: Payer: Self-pay

## 2021-09-05 ENCOUNTER — Inpatient Hospital Stay (HOSPITAL_COMMUNITY): Admission: RE | Admit: 2021-09-05 | Payer: Medicare HMO | Source: Ambulatory Visit

## 2021-09-05 NOTE — Telephone Encounter (Signed)
Pt will be due for LFT's next week. Will have her repeat labs at the same time. ?

## 2021-09-05 NOTE — Telephone Encounter (Signed)
-----   Message from Yevette Edwards, RN sent at 08/08/2021  2:51 PM EST ----- ?Regarding: Labs ?CBC, need to enter order ? ?

## 2021-09-11 ENCOUNTER — Telehealth: Payer: Self-pay

## 2021-09-11 ENCOUNTER — Ambulatory Visit (HOSPITAL_COMMUNITY)
Admission: RE | Admit: 2021-09-11 | Discharge: 2021-09-11 | Disposition: A | Discharge status: Home / Self Care | Payer: Medicare HMO | Source: Ambulatory Visit | Attending: Gastroenterology | Admitting: Gastroenterology

## 2021-09-11 ENCOUNTER — Other Ambulatory Visit: Payer: Self-pay

## 2021-09-11 DIAGNOSIS — I361 Nonrheumatic tricuspid (valve) insufficiency: Secondary | ICD-10-CM | POA: Diagnosis not present

## 2021-09-11 DIAGNOSIS — I340 Nonrheumatic mitral (valve) insufficiency: Secondary | ICD-10-CM | POA: Diagnosis not present

## 2021-09-11 DIAGNOSIS — I8501 Esophageal varices with bleeding: Secondary | ICD-10-CM | POA: Diagnosis not present

## 2021-09-11 DIAGNOSIS — K7031 Alcoholic cirrhosis of liver with ascites: Secondary | ICD-10-CM | POA: Insufficient documentation

## 2021-09-11 DIAGNOSIS — D509 Iron deficiency anemia, unspecified: Secondary | ICD-10-CM

## 2021-09-11 DIAGNOSIS — I8500 Esophageal varices without bleeding: Secondary | ICD-10-CM

## 2021-09-11 DIAGNOSIS — Z01810 Encounter for preprocedural cardiovascular examination: Secondary | ICD-10-CM | POA: Diagnosis not present

## 2021-09-11 DIAGNOSIS — I3139 Other pericardial effusion (noninflammatory): Secondary | ICD-10-CM | POA: Diagnosis not present

## 2021-09-11 DIAGNOSIS — R69 Illness, unspecified: Secondary | ICD-10-CM | POA: Diagnosis not present

## 2021-09-11 LAB — ECHOCARDIOGRAM COMPLETE
AR max vel: 2.47 cm2
AV Peak grad: 4.8 mmHg
Ao pk vel: 1.09 m/s
Area-P 1/2: 5.97 cm2
Calc EF: 65.8 %
S' Lateral: 2 cm
Single Plane A2C EF: 66.8 %
Single Plane A4C EF: 64.3 %

## 2021-09-11 MED ORDER — SPIRONOLACTONE 100 MG PO TABS: ORAL_TABLET | ORAL | 2 refills | Status: DC

## 2021-09-11 MED ORDER — FUROSEMIDE 20 MG PO TABS: 40.0000 mg | ORAL_TABLET | Freq: Two times a day (BID) | ORAL | 2 refills | Status: DC

## 2021-09-11 NOTE — Telephone Encounter (Signed)
Pt returned call. I reminded her that she is due for labs this week since she is in the diuretics. Pt stated "No", I asked her what she meant and she said that she ran out of her medications. I asked her how long she had been off of the medications, she stated that she "hadn't been off of them but she ran out about 2 weeks ago". Pt also questioned why she had an appt today for echocardiogram, I reviewed the plan from the last office visit and told her that it was ordered in preparation for possible TIPS procedure. Pt also stated that her belly was tight and she needed a paracentesis. The order for a paracentesis is in epic and I told pt that she could call radiology scheduling and set up her appt. I told pt that we will refill her medication and check to see when you wanted her to repeat labs since she has not been on diuretics for 2 weeks. Pt stated that she didn't have any other questions. She knows that I will call her with recommendations. ?

## 2021-09-11 NOTE — Telephone Encounter (Signed)
-----   Message from Yevette Edwards, RN sent at 08/21/2021 10:41 AM EST ----- ?Regarding: Labs ?BMET, the order is in epic. ? ?

## 2021-09-11 NOTE — Addendum Note (Signed)
Addended by: Yevette Edwards on: 09/11/2021 12:55 PM ? ? Modules accepted: Orders ? ?

## 2021-09-11 NOTE — Telephone Encounter (Signed)
Returned call to patient and reviewed information below from Dr. Havery Moros. I told pt that if she starts medications tomorrow she will be due for repeat labs on 09/26/21. I told pt to put labs on her calendar. Pt knows that I will also call her to remind her. Pt verbalized understanding and had no concerns at the end of the call. ? ?New lab reminder in epic. ?

## 2021-09-11 NOTE — Addendum Note (Signed)
Addended by: Yevette Edwards on: 09/11/2021 10:57 AM ? ? Modules accepted: Orders ? ?

## 2021-09-11 NOTE — Telephone Encounter (Signed)
See telephone encounter for 09/11/21.  ?

## 2021-09-11 NOTE — Telephone Encounter (Signed)
Lm on vm for patient to return call 

## 2021-09-11 NOTE — Telephone Encounter (Signed)
This has been an ongoing issue for her - she has been noncompliant with her medications and follow up, yet continues to complain about ascites. She had requested to see IR for TIPS but without the echo she will not be considered for that.  ?If she wants to take the diuretics she must follow up for labs. Can refill diuretics for 1 month with 3 refills. Repeat BMET in 2 weeks. Thanks ?

## 2021-09-13 ENCOUNTER — Other Ambulatory Visit: Payer: Self-pay | Admitting: Interventional Radiology

## 2021-09-13 ENCOUNTER — Ambulatory Visit (HOSPITAL_COMMUNITY)
Admission: RE | Admit: 2021-09-13 | Discharge: 2021-09-13 | Disposition: A | Discharge status: Home / Self Care | Payer: Medicare HMO | Source: Ambulatory Visit | Attending: Gastroenterology | Admitting: Gastroenterology

## 2021-09-13 ENCOUNTER — Other Ambulatory Visit: Payer: Self-pay

## 2021-09-13 DIAGNOSIS — K7031 Alcoholic cirrhosis of liver with ascites: Secondary | ICD-10-CM | POA: Insufficient documentation

## 2021-09-13 DIAGNOSIS — R188 Other ascites: Secondary | ICD-10-CM | POA: Diagnosis not present

## 2021-09-13 DIAGNOSIS — R69 Illness, unspecified: Secondary | ICD-10-CM | POA: Diagnosis not present

## 2021-09-13 DIAGNOSIS — K7460 Unspecified cirrhosis of liver: Secondary | ICD-10-CM | POA: Diagnosis not present

## 2021-09-13 HISTORY — PX: IR PARACENTESIS: IMG2679

## 2021-09-13 MED ORDER — ALBUMIN HUMAN 25 % IV SOLN
INTRAVENOUS | Status: DC
Start: 2021-09-13 — End: 2021-09-14
  Filled 2021-09-13: qty 200

## 2021-09-13 MED ORDER — LIDOCAINE HCL (PF) 1 % IJ SOLN
INTRAMUSCULAR | Status: DC | PRN
  Administered 2021-09-13: 5 mL

## 2021-09-13 MED ORDER — ALBUMIN HUMAN 25 % IV SOLN
50.0000 g | Freq: Once | INTRAVENOUS | Status: AC
  Administered 2021-09-13: 50 g via INTRAVENOUS

## 2021-09-13 MED ORDER — LIDOCAINE HCL 1 % IJ SOLN
INTRAMUSCULAR | Status: AC
  Filled 2021-09-13: qty 20

## 2021-09-13 NOTE — Procedures (Signed)
PROCEDURE SUMMARY: ? ?Successful ultrasound guided therapeutic paracentesis from the RLQ quadrant.  ?Yielded 8.5 L of clear, yellow fluid.  ?No immediate complications.  ?The patient tolerated the procedure well.  ? ?Specimen not sent for labs. ? ?EBL < 74m ? ? ? ? ?SNarda Rutherford AGNP-BC ?09/13/2021, 2:29 PM ? ?  ?

## 2021-09-16 ENCOUNTER — Telehealth: Payer: Self-pay

## 2021-09-16 NOTE — Telephone Encounter (Signed)
Patient is scheduled for IR evaluation on Tuesday, 10/01/21. ?

## 2021-09-16 NOTE — Telephone Encounter (Signed)
-----   Message from Yevette Edwards, RN sent at 09/12/2021 10:13 AM EDT ----- ?Regarding: IR appt ?Check to see if patient has been scheduled for TIPS evaluation with IR ? ?

## 2021-09-24 ENCOUNTER — Telehealth: Payer: Self-pay

## 2021-09-24 NOTE — Telephone Encounter (Signed)
Spoke with patient to remind her that she will be due for repeat labs on Thursday, 09/26/21. No appointment is necessary. Patient is aware that she can stop by the lab in the basement at her convenience on Thursday. Pt stated that she is "about ready to have the stent placed" and wanted to know when she would be scheduled. I told pt that her appt with IR is on 4/11 at [facility], pt stated that she was not aware of the appt. I told her that she needed to call [place] because she has specific instructions that she needs to follow prior to her evaluation next week. I provided pt with the phone number to [place]. Patient verbalized understanding and had no concerns at the end of the call.  ? ?

## 2021-09-24 NOTE — Telephone Encounter (Signed)
-----   Message from Yevette Edwards, RN sent at 09/11/2021 12:49 PM EDT ----- ?Regarding: Labs ?BMET, the order is in epic. ?Due on 4/6 ? ?

## 2021-10-01 ENCOUNTER — Telehealth: Payer: Self-pay | Admitting: Gastroenterology

## 2021-10-01 ENCOUNTER — Other Ambulatory Visit: Payer: Medicare HMO

## 2021-10-01 DIAGNOSIS — K7031 Alcoholic cirrhosis of liver with ascites: Secondary | ICD-10-CM

## 2021-10-01 NOTE — Telephone Encounter (Signed)
Dr. Henrene Pastor as DOD AM of 10/01/21, please advise. ? ?Dr. Havery Moros patient - alcoholic cirrhosis of liver with ascites ? ?Returned call to patient. She states that she has a lot of fluid and needs to get the fluid off of her. Pt states that she has only been taking Lasix 40 mg BID. She states that is all the pharmacy gave her. She is supposed to be on Spironolactone 200 mg daily as well. Pt is aware that I have to get a message to the covering provider before we can order a paracentesis. Last paracentesis was 09/13/21, 8.5 L removed ?

## 2021-10-01 NOTE — Telephone Encounter (Signed)
Inbound call from patient stating she is in need of a paracentesis. Please advise.  ?

## 2021-10-01 NOTE — Telephone Encounter (Addendum)
No available appts this afternoon, the appts are held for inpatient. Patient has been scheduled for a paracentesis at [facility] on Wednesday, 4/12/232 at 10 am. Pt will need to arrive at [facility] by 9:45 am.  ? ?Lm on vm for patient to return call. ?

## 2021-10-01 NOTE — Telephone Encounter (Signed)
Called and spoke with patient regarding her appt. Pt verbalized understanding and had no concerns at the end of the call. ?

## 2021-10-01 NOTE — Telephone Encounter (Signed)
Reviewed.  Normal renal function. ?Large volume paracentesis (up to 8 L) with IV albumin replacement per protocol (8 g IV per liter removed). ?Send fluid for cell count with differential. ?Further management per Dr. Havery Moros upon his return ?

## 2021-10-02 ENCOUNTER — Ambulatory Visit (HOSPITAL_COMMUNITY)
Admission: RE | Admit: 2021-10-02 | Discharge: 2021-10-02 | Disposition: A | Discharge status: Home / Self Care | Payer: Medicare HMO | Source: Ambulatory Visit | Attending: Internal Medicine | Admitting: Internal Medicine

## 2021-10-02 DIAGNOSIS — R188 Other ascites: Secondary | ICD-10-CM | POA: Diagnosis not present

## 2021-10-02 DIAGNOSIS — K7031 Alcoholic cirrhosis of liver with ascites: Secondary | ICD-10-CM | POA: Diagnosis not present

## 2021-10-02 DIAGNOSIS — R69 Illness, unspecified: Secondary | ICD-10-CM | POA: Diagnosis not present

## 2021-10-02 HISTORY — PX: IR PARACENTESIS: IMG2679

## 2021-10-02 LAB — BODY FLUID CELL COUNT WITH DIFFERENTIAL
Eos, Fluid: 0 %
Lymphs, Fluid: 20 %
Monocyte-Macrophage-Serous Fluid: 58 % (ref 50–90)
Neutrophil Count, Fluid: 22 % (ref 0–25)
Total Nucleated Cell Count, Fluid: 38 cu mm (ref 0–1000)

## 2021-10-02 MED ORDER — LIDOCAINE HCL (PF) 1 % IJ SOLN
INTRAMUSCULAR | Status: DC | PRN
  Administered 2021-10-02: 10 mL

## 2021-10-02 MED ORDER — LIDOCAINE HCL 1 % IJ SOLN
INTRAMUSCULAR | Status: AC
  Filled 2021-10-02: qty 20

## 2021-10-02 MED ORDER — ALBUMIN HUMAN 25 % IV SOLN
50.0000 g | Freq: Once | INTRAVENOUS | Status: AC
  Filled 2021-10-02: qty 200

## 2021-10-02 MED ORDER — ALBUMIN HUMAN 25 % IV SOLN
INTRAVENOUS | Status: AC
  Administered 2021-10-02: 50 g via INTRAVENOUS
  Filled 2021-10-02: qty 200

## 2021-10-02 NOTE — Procedures (Signed)
PROCEDURE SUMMARY: ? ?Successful ultrasound guided paracentesis from the right lower quadrant.  ?Yielded 8.0L of clear, yellow fluid.  ?No immediate complications.  ?The patient tolerated the procedure well.  ? ?Specimen was sent for labs. ? ?EBL < 37m ? ?The patient has previously been formally [facility] by [facility] and is being actively followed for potential future intervention. ? ? ?KBrynda Greathouse MS RD PA-C ? ? ?

## 2021-10-03 LAB — PATHOLOGIST SMEAR REVIEW

## 2021-10-11 ENCOUNTER — Ambulatory Visit
Admission: RE | Admit: 2021-10-11 | Discharge: 2021-10-11 | Disposition: A | Discharge status: Home / Self Care | Payer: Medicare HMO | Source: Ambulatory Visit | Attending: Interventional Radiology | Admitting: Interventional Radiology

## 2021-10-11 ENCOUNTER — Encounter: Payer: Self-pay | Admitting: *Deleted

## 2021-10-11 ENCOUNTER — Other Ambulatory Visit (HOSPITAL_COMMUNITY): Payer: Self-pay | Admitting: Interventional Radiology

## 2021-10-11 ENCOUNTER — Ambulatory Visit
Admission: RE | Admit: 2021-10-11 | Discharge: 2021-10-11 | Disposition: A | Discharge status: Home / Self Care | Payer: Medicare HMO | Source: Ambulatory Visit | Attending: Gastroenterology | Admitting: Gastroenterology

## 2021-10-11 DIAGNOSIS — K8020 Calculus of gallbladder without cholecystitis without obstruction: Secondary | ICD-10-CM | POA: Diagnosis not present

## 2021-10-11 DIAGNOSIS — K7031 Alcoholic cirrhosis of liver with ascites: Secondary | ICD-10-CM

## 2021-10-11 DIAGNOSIS — R69 Illness, unspecified: Secondary | ICD-10-CM | POA: Diagnosis not present

## 2021-10-11 DIAGNOSIS — K766 Portal hypertension: Secondary | ICD-10-CM | POA: Diagnosis not present

## 2021-10-11 DIAGNOSIS — K7689 Other specified diseases of liver: Secondary | ICD-10-CM | POA: Diagnosis not present

## 2021-10-11 DIAGNOSIS — Z01818 Encounter for other preprocedural examination: Secondary | ICD-10-CM | POA: Diagnosis not present

## 2021-10-11 HISTORY — PX: IR RADIOLOGIST EVAL & MGMT: IMG5224

## 2021-10-11 NOTE — Progress Notes (Addendum)
Patient ID: Katie Holland, female   DOB: 1966-06-15, 56 y.o.   MRN: [id number] ?    ? ? ?Chief Complaint: ?Patient was seen in consultation today for TIPS at the request of Armbruster,Steven P ? ?Referring Physician(s): ?Armbruster,Steven P ? ?History of Present Illness: ?Angelika Jerrett is a 56 y.o. 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  Remote  history of portal vein thrombosis and cavernous transformation.  She has had multiple endoscopies and banding of large bleeding esophageal varices.  We had initially consulted   with her back in 2020 for TIPS creation, but this was delayed due to the pandemic and subsequently due to hepatic decompensation with an elevated MELD of 21.  She had started a liver transplant evaluation with [facility] at that time.  Now, she is clinically doing much better except for the recurrent ascites.  We have seen her 17 times in the past  year for therapeutic paracentesis removing typically between 8-10 L in each setting  She  tolerated these fairly well, but with some malaise post procedure.    She is cleared with mental status.    ?  ? ?Past Medical History:  ?Diagnosis Date  ? Alcoholic liver disease ([place])   ? Chronic back pain   ? on disability  ? Cirrhosis of liver ([place])   ? Esophageal varices ([facility])   ? GERD (gastroesophageal reflux disease)   ? Headache   ? Jaundice   ? ? ?Past Surgical History:  ?Procedure Laterality Date  ? BACK SURGERY    ? L3-L4  ? BIOPSY  08/07/2020  ? Procedure: BIOPSY;  Surgeon: Yetta Flock, MD;  Location: [facility] END[facility];  Service: Gastroenterology;;  ? COLONOSCOPY WITH PROPOFOL N/A 08/01/2019  ? Procedure: COLONOSCOPY WITH PROPOFOL;  Surgeon: Yetta Flock, MD;  Location: [facility] ENDOSCOPY;  Service: Gastroenterology;  Laterality: N/A;  ? ESOPHAGEAL BANDING  04/25/2018  ? Procedure: ESOPHAGEAL BANDING;  Surgeon:  Yetta Flock, MD;  Location: [facility] ENDOSCOPY;  Service: Gastroenterology;;  ? ESOPHAGEAL BANDING  04/27/2018  ? Procedure: ESOPHAGEAL BANDING;  Surgeon: Milus Banister, MD;  Location: [facility] ENDOSCOPY;  Service: Endoscopy;;  ? ESOPHAGEAL BANDING  06/02/2018  ? Procedure: ESOPHAGEAL BANDING;  Surgeon: Doran Stabler, MD;  Location: [place];  Service: Gastroenterology;;  ? ESOPHAGEAL BANDING N/A 07/07/2018  ? Procedure: ESOPHAGEAL BANDING;  Surgeon: Yetta Flock, MD;  Location: [facility]OS[facility];  Service: Gastroenterology;  Laterality: N/A;  ? ESOPHAGEAL BANDING  07/27/2018  ? Procedure: ESOPHAGEAL BANDING;  Surgeon: Yetta Flock, MD;  Location: [facility] ENDOSCOPY;  Service: Gastroenterology;;  ? ESOPHAGEAL BANDING  08/30/2018  ? Procedure: ESOPHAGEAL BANDING;  Surgeon: Yetta Flock, MD;  Location: [facility] ENDOSCOPY;  Service: Gastroenterology;;  ? ESOPHAGOGASTRODUODENOSCOPY N/A 04/27/2018  ? Procedure: ESOPHAGOGASTRODUODENOSCOPY (EGD);  Surgeon: Milus Banister, MD;  Location: [facility] ENDOSCOPY;  Service: Endoscopy;  Laterality: N/A;  ? ESOPHAGOGASTRODUODENOSCOPY (EGD) WITH PROPOFOL N/A 04/25/2018  ? Procedure: ESOPHAGOGASTRODUODENOSCOPY (EGD) WITH PROPOFOL;  Surgeon: Yetta Flock, MD;  Location: [place];  Service: Gastroenterology;  Laterality: N/A;  ? ESOPHAGOGASTRODUODENOSCOPY (EGD) WITH PROPOFOL N/A 06/02/2018  ? Procedure: ESOPHAGOGASTRODUODENOSCOPY (EGD) WITH PROPOFOL;  Surgeon: Doran Stabler, MD;  Location: [place];  Service: Gastroenterology;  Laterality: N/A;  ? ESOPHAGOGASTRODUODENOSCOPY (EGD) WITH PROPOFOL N/A 07/07/2018  ? Procedure: ESOPHAGOGASTRODUODENOSCOPY (EGD) WITH PROPOFOL;  Surgeon: Yetta Flock, MD;  Location:  [facility] ENDOSCOPY;  Service: Gastroenterology;  Laterality: N/A;  ? ESOPHAGOGASTRODUODENOSCOPY (EGD) WITH PROPOFOL N/A 07/27/2018  ? Procedure: ESOPHAGOGASTRODUODENOSCOPY (EGD) WITH PROPOFOL;  Surgeon: Yetta Flock, MD;  Location: [facility] ENDOSCOPY;   Service: Gastroenterology;  Laterality: N/A;  ? ESOPHAGOGASTRODUODENOSCOPY (EGD) WITH PROPOFOL N/A 08/30/2018  ? Procedure: ESOPHAGOGASTRODUODENOSCOPY (EGD) WITH PROPOFOL;  Surgeon: Yetta Flock, MD;  Location: [facility] ENDOSCOPY;  Service: Gastroenterology;  Laterality: N/A;  ? ESOPHAGOGASTRODUODENOSCOPY (EGD) WITH PROPOFOL N/A 11/08/2018  ? Procedure: ESOPHAGOGASTRODUODENOSCOPY (EGD) WITH PROPOFOL;  Surgeon: Yetta Flock, MD;  Location: [facility] ENDOSCOPY;  Service: Gastroenterology;  Laterality: N/A;  ? ESOPHAGOGASTRODUODENOSCOPY (EGD) WITH PROPOFOL N/A 01/31/2019  ? Procedure: ESOPHAGOGASTRODUODENOSCOPY (EGD) WITH PROPOFOL;  Surgeon: Yetta Flock, MD;  Location: [facility] ENDOSCOPY;  Service: Gastroenterology;  Laterality: N/A;  ? ESOPHAGOGASTRODUODENOSCOPY (EGD) WITH PROPOFOL N/A 08/01/2019  ? Procedure: ESOPHAGOGASTRODUODENOSCOPY (EGD) WITH PROPOFOL;  Surgeon: Yetta Flock, MD;  Location: [facility] ENDOSCOPY;  Service: Gastroenterology;  Laterality: N/A;  ? ESOPHAGOGASTRODUODENOSCOPY (EGD) WITH PROPOFOL N/A 08/07/2020  ? Procedure: ESOPHAGOGASTRODUODENOSCOPY (EGD) WITH PROPOFOL;  Surgeon: Yetta Flock, MD;  Location: [facility] ENDOSCOPY;  Service: Gastroenterology;  Laterality: N/A;  ? ESOPHAGOGASTRODUODENOSCOPY (EGD) WITH PROPOFOL N/A 08/22/2021  ? Procedure: ESOPHAGOGASTRODUODENOSCOPY (EGD) WITH PROPOFOL;  Surgeon: Yetta Flock, MD;  Location: [facility] ENDOSCOPY;  Service: Gastroenterology;  Laterality: N/A;  ? IR PARACENTESIS  04/26/2018  ? IR PARACENTESIS  05/18/2018  ? IR PARACENTESIS  05/31/2018  ? IR PARACENTESIS  06/30/2018  ? IR PARACENTESIS  08/05/2018  ? IR PARACENTESIS  08/31/2018  ? IR PARACENTESIS  10/08/2018  ? IR PARACENTESIS  10/28/2018  ? IR PARACENTESIS  02/14/2019  ? IR PARACENTESIS  03/09/2019  ? IR PARACENTESIS  04/05/2019  ? IR PARACENTESIS  05/03/2019  ? IR PARACENTESIS  06/20/2019  ? IR PARACENTESIS  07/19/2019  ? IR PARACENTESIS  08/19/2019  ? IR PARACENTESIS  09/27/2019  ? IR PARACENTESIS   10/07/2019  ? IR PARACENTESIS  10/28/2019  ? IR PARACENTESIS  11/16/2019  ? IR PARACENTESIS  12/30/2019  ? IR PARACENTESIS  01/16/2020  ? IR PARACENTESIS  02/07/2020  ? IR PARACENTESIS  03/06/2020  ? IR PARACENTESIS  04/04/2020  ? IR PARACENTESIS  04/17/2020  ? IR PARACENTESIS  05/04/2020  ? IR PARACENTESIS  06/07/2020  ? IR PARACENTESIS  06/28/2020  ? IR PARACENTESIS  07/27/2020  ? IR PARACENTESIS  08/20/2020  ? IR PARACENTESIS  09/13/2020  ? IR PARACENTESIS  10/04/2020  ? IR PARACENTESIS  10/25/2020  ? IR PARACENTESIS  11/07/2020  ? IR PARACENTESIS  11/30/2020  ? IR PARACENTESIS  12/13/2020  ? IR PARACENTESIS  01/04/2021  ? IR PARACENTESIS  01/25/2021  ? IR PARACENTESIS  02/20/2021  ? IR PARACENTESIS  03/20/2021  ? IR PARACENTESIS  04/18/2021  ? IR PARACENTESIS  05/14/2021  ? IR PARACENTESIS  06/10/2021  ? IR PARACENTESIS  06/27/2021  ? IR PARACENTESIS  07/17/2021  ? IR PARACENTESIS  08/12/2021  ? IR PARACENTESIS  08/23/2021  ? IR PARACENTESIS  09/13/2021  ? IR PARACENTESIS  10/02/2021  ? IR RADIOLOGIST EVAL & MGMT  12/23/2018  ? IR THORACENTESIS ASP PLEURAL SPACE W/IMG GUIDE  12/10/2018  ? IR THORACENTESIS ASP PLEURAL SPACE W/IMG GUIDE  01/21/2019  ? ? ?Allergies: ?Contrast media [iodinated contrast media], Latex, and Sulfa antibiotics ? ?Medications: ?Prior to Admission medications   ?Medication Sig Start Date End Date Taking? Authorizing Provider  ?ferrous sulfate 325 (65 FE) MG EC tablet Take 1 tablet (325 mg total) by mouth daily  with breakfast. 08/08/21   Armbruster, Carlota Raspberry, MD  ?furosemide (LASIX) 20 MG tablet Take 2 tablets (40 mg total) by mouth 2 (two) times daily. 09/11/21 12/10/21  Yetta Flock, MD  ?Multiple Vitamin (MULTIVITAMIN WITH MINERALS) TABS tablet Take 1 tablet by mouth daily.    [provider]  ?prednisoLONE acetate (PRED FORTE) 1 % ophthalmic suspension Place 1 drop into both eyes 2 (two) times daily.    [provider]  ?spironolactone (ALDACTONE) 100 MG tablet Take 2 tablets (200 mg total) by mouth  once daily 09/11/21   Armbruster, Carlota Raspberry, MD  ?vitamin B-12 (CYANOCOBALAMIN) 1000 MCG tablet Take 1 tablet (1,000 mcg total) by mouth daily. 08/08/21   Armbruster, Carlota Raspberry, MD  ?  ? ?Family History  ?Problem Relatio

## 2021-10-22 ENCOUNTER — Other Ambulatory Visit: Payer: Self-pay

## 2021-10-22 ENCOUNTER — Other Ambulatory Visit: Payer: Self-pay | Admitting: Internal Medicine

## 2021-10-22 ENCOUNTER — Encounter (HOSPITAL_COMMUNITY): Payer: Self-pay

## 2021-10-22 ENCOUNTER — Encounter (HOSPITAL_COMMUNITY): Payer: Self-pay | Admitting: Emergency Medicine

## 2021-10-22 ENCOUNTER — Ambulatory Visit (HOSPITAL_COMMUNITY)
Admission: RE | Admit: 2021-10-22 | Discharge: 2021-10-22 | Disposition: A | Discharge status: Home / Self Care | Payer: Medicare HMO | Source: Ambulatory Visit | Attending: Gastroenterology | Admitting: Gastroenterology

## 2021-10-22 ENCOUNTER — Inpatient Hospital Stay (HOSPITAL_COMMUNITY): Payer: Medicare HMO

## 2021-10-22 ENCOUNTER — Inpatient Hospital Stay (HOSPITAL_COMMUNITY)
Admission: EM | Admit: 2021-10-22 | Discharge: 2021-11-08 | DRG: 853 | Disposition: A | Discharge status: Home Health | Payer: Medicare HMO | Source: Ambulatory Visit | Attending: Internal Medicine | Admitting: Internal Medicine

## 2021-10-22 ENCOUNTER — Emergency Department (HOSPITAL_COMMUNITY): Payer: Medicare HMO

## 2021-10-22 DIAGNOSIS — E43 Unspecified severe protein-calorie malnutrition: Secondary | ICD-10-CM | POA: Diagnosis present

## 2021-10-22 DIAGNOSIS — Z91041 Radiographic dye allergy status: Secondary | ICD-10-CM

## 2021-10-22 DIAGNOSIS — R112 Nausea with vomiting, unspecified: Secondary | ICD-10-CM | POA: Diagnosis not present

## 2021-10-22 DIAGNOSIS — Z1152 Encounter for screening for COVID-19: Secondary | ICD-10-CM

## 2021-10-22 DIAGNOSIS — I8500 Esophageal varices without bleeding: Secondary | ICD-10-CM | POA: Diagnosis present

## 2021-10-22 DIAGNOSIS — J9382 Other air leak: Secondary | ICD-10-CM | POA: Diagnosis not present

## 2021-10-22 DIAGNOSIS — Z882 Allergy status to sulfonamides status: Secondary | ICD-10-CM

## 2021-10-22 DIAGNOSIS — Z79899 Other long term (current) drug therapy: Secondary | ICD-10-CM

## 2021-10-22 DIAGNOSIS — Z6821 Body mass index (BMI) 21.0-21.9, adult: Secondary | ICD-10-CM

## 2021-10-22 DIAGNOSIS — R7881 Bacteremia: Secondary | ICD-10-CM | POA: Diagnosis not present

## 2021-10-22 DIAGNOSIS — F1020 Alcohol dependence, uncomplicated: Secondary | ICD-10-CM | POA: Diagnosis not present

## 2021-10-22 DIAGNOSIS — R7989 Other specified abnormal findings of blood chemistry: Secondary | ICD-10-CM | POA: Diagnosis present

## 2021-10-22 DIAGNOSIS — I10 Essential (primary) hypertension: Secondary | ICD-10-CM | POA: Insufficient documentation

## 2021-10-22 DIAGNOSIS — Z4682 Encounter for fitting and adjustment of non-vascular catheter: Secondary | ICD-10-CM | POA: Diagnosis not present

## 2021-10-22 DIAGNOSIS — R0602 Shortness of breath: Principal | ICD-10-CM

## 2021-10-22 DIAGNOSIS — D6959 Other secondary thrombocytopenia: Secondary | ICD-10-CM | POA: Diagnosis present

## 2021-10-22 DIAGNOSIS — D509 Iron deficiency anemia, unspecified: Secondary | ICD-10-CM | POA: Diagnosis present

## 2021-10-22 DIAGNOSIS — E538 Deficiency of other specified B group vitamins: Secondary | ICD-10-CM | POA: Diagnosis present

## 2021-10-22 DIAGNOSIS — R6520 Severe sepsis without septic shock: Secondary | ICD-10-CM | POA: Diagnosis not present

## 2021-10-22 DIAGNOSIS — Z91040 Latex allergy status: Secondary | ICD-10-CM

## 2021-10-22 DIAGNOSIS — J939 Pneumothorax, unspecified: Secondary | ICD-10-CM | POA: Diagnosis not present

## 2021-10-22 DIAGNOSIS — M549 Dorsalgia, unspecified: Secondary | ICD-10-CM | POA: Diagnosis not present

## 2021-10-22 DIAGNOSIS — R846 Abnormal cytological findings in specimens from respiratory organs and thorax: Secondary | ICD-10-CM | POA: Diagnosis not present

## 2021-10-22 DIAGNOSIS — R946 Abnormal results of thyroid function studies: Secondary | ICD-10-CM | POA: Diagnosis present

## 2021-10-22 DIAGNOSIS — J181 Lobar pneumonia, unspecified organism: Secondary | ICD-10-CM | POA: Diagnosis not present

## 2021-10-22 DIAGNOSIS — A419 Sepsis, unspecified organism: Secondary | ICD-10-CM | POA: Diagnosis not present

## 2021-10-22 DIAGNOSIS — Z66 Do not resuscitate: Secondary | ICD-10-CM | POA: Diagnosis present

## 2021-10-22 DIAGNOSIS — R0902 Hypoxemia: Secondary | ICD-10-CM | POA: Insufficient documentation

## 2021-10-22 DIAGNOSIS — Z91119 Patient's noncompliance with dietary regimen due to unspecified reason: Secondary | ICD-10-CM

## 2021-10-22 DIAGNOSIS — J9601 Acute respiratory failure with hypoxia: Secondary | ICD-10-CM | POA: Diagnosis not present

## 2021-10-22 DIAGNOSIS — J189 Pneumonia, unspecified organism: Secondary | ICD-10-CM | POA: Diagnosis present

## 2021-10-22 DIAGNOSIS — K7031 Alcoholic cirrhosis of liver with ascites: Secondary | ICD-10-CM

## 2021-10-22 DIAGNOSIS — R69 Illness, unspecified: Secondary | ICD-10-CM | POA: Diagnosis not present

## 2021-10-22 DIAGNOSIS — K219 Gastro-esophageal reflux disease without esophagitis: Secondary | ICD-10-CM | POA: Diagnosis present

## 2021-10-22 DIAGNOSIS — R000 Tachycardia, unspecified: Secondary | ICD-10-CM | POA: Insufficient documentation

## 2021-10-22 DIAGNOSIS — R64 Cachexia: Secondary | ICD-10-CM | POA: Diagnosis present

## 2021-10-22 DIAGNOSIS — R778 Other specified abnormalities of plasma proteins: Secondary | ICD-10-CM | POA: Diagnosis present

## 2021-10-22 DIAGNOSIS — I248 Other forms of acute ischemic heart disease: Secondary | ICD-10-CM | POA: Diagnosis present

## 2021-10-22 DIAGNOSIS — I8510 Secondary esophageal varices without bleeding: Secondary | ICD-10-CM | POA: Diagnosis not present

## 2021-10-22 DIAGNOSIS — J948 Other specified pleural conditions: Secondary | ICD-10-CM | POA: Diagnosis not present

## 2021-10-22 DIAGNOSIS — A4151 Sepsis due to Escherichia coli [E. coli]: Secondary | ICD-10-CM | POA: Diagnosis not present

## 2021-10-22 DIAGNOSIS — F17210 Nicotine dependence, cigarettes, uncomplicated: Secondary | ICD-10-CM | POA: Diagnosis present

## 2021-10-22 DIAGNOSIS — K7460 Unspecified cirrhosis of liver: Secondary | ICD-10-CM | POA: Diagnosis not present

## 2021-10-22 DIAGNOSIS — E876 Hypokalemia: Secondary | ICD-10-CM | POA: Diagnosis not present

## 2021-10-22 DIAGNOSIS — R188 Other ascites: Secondary | ICD-10-CM | POA: Diagnosis present

## 2021-10-22 DIAGNOSIS — K429 Umbilical hernia without obstruction or gangrene: Secondary | ICD-10-CM | POA: Diagnosis present

## 2021-10-22 DIAGNOSIS — T502X6A Underdosing of carbonic-anhydrase inhibitors, benzothiadiazides and other diuretics, initial encounter: Secondary | ICD-10-CM | POA: Diagnosis present

## 2021-10-22 DIAGNOSIS — Z23 Encounter for immunization: Secondary | ICD-10-CM

## 2021-10-22 DIAGNOSIS — E559 Vitamin D deficiency, unspecified: Secondary | ICD-10-CM | POA: Diagnosis present

## 2021-10-22 DIAGNOSIS — J918 Pleural effusion in other conditions classified elsewhere: Secondary | ICD-10-CM | POA: Diagnosis present

## 2021-10-22 DIAGNOSIS — R918 Other nonspecific abnormal finding of lung field: Secondary | ICD-10-CM | POA: Diagnosis not present

## 2021-10-22 DIAGNOSIS — F17200 Nicotine dependence, unspecified, uncomplicated: Secondary | ICD-10-CM | POA: Diagnosis not present

## 2021-10-22 DIAGNOSIS — E531 Pyridoxine deficiency: Secondary | ICD-10-CM | POA: Diagnosis present

## 2021-10-22 DIAGNOSIS — J90 Pleural effusion, not elsewhere classified: Secondary | ICD-10-CM | POA: Diagnosis present

## 2021-10-22 DIAGNOSIS — R109 Unspecified abdominal pain: Secondary | ICD-10-CM | POA: Diagnosis not present

## 2021-10-22 DIAGNOSIS — R531 Weakness: Secondary | ICD-10-CM | POA: Diagnosis not present

## 2021-10-22 DIAGNOSIS — G8929 Other chronic pain: Secondary | ICD-10-CM | POA: Diagnosis present

## 2021-10-22 DIAGNOSIS — B9620 Unspecified Escherichia coli [E. coli] as the cause of diseases classified elsewhere: Secondary | ICD-10-CM | POA: Diagnosis not present

## 2021-10-22 DIAGNOSIS — E871 Hypo-osmolality and hyponatremia: Secondary | ICD-10-CM | POA: Diagnosis not present

## 2021-10-22 DIAGNOSIS — R030 Elevated blood-pressure reading, without diagnosis of hypertension: Secondary | ICD-10-CM | POA: Diagnosis present

## 2021-10-22 DIAGNOSIS — J9811 Atelectasis: Secondary | ICD-10-CM | POA: Diagnosis not present

## 2021-10-22 DIAGNOSIS — R091 Pleurisy: Secondary | ICD-10-CM | POA: Diagnosis not present

## 2021-10-22 DIAGNOSIS — E8770 Fluid overload, unspecified: Secondary | ICD-10-CM | POA: Diagnosis not present

## 2021-10-22 DIAGNOSIS — R278 Other lack of coordination: Secondary | ICD-10-CM | POA: Diagnosis present

## 2021-10-22 DIAGNOSIS — E60 Dietary zinc deficiency: Secondary | ICD-10-CM | POA: Diagnosis present

## 2021-10-22 DIAGNOSIS — K7290 Hepatic failure, unspecified without coma: Secondary | ICD-10-CM

## 2021-10-22 DIAGNOSIS — J95811 Postprocedural pneumothorax: Secondary | ICD-10-CM | POA: Diagnosis not present

## 2021-10-22 DIAGNOSIS — Z91138 Patient's unintentional underdosing of medication regimen for other reason: Secondary | ICD-10-CM

## 2021-10-22 DIAGNOSIS — Z48813 Encounter for surgical aftercare following surgery on the respiratory system: Secondary | ICD-10-CM | POA: Diagnosis not present

## 2021-10-22 DIAGNOSIS — K7040 Alcoholic hepatic failure without coma: Secondary | ICD-10-CM | POA: Diagnosis present

## 2021-10-22 DIAGNOSIS — R911 Solitary pulmonary nodule: Secondary | ICD-10-CM | POA: Diagnosis not present

## 2021-10-22 DIAGNOSIS — Z86718 Personal history of other venous thrombosis and embolism: Secondary | ICD-10-CM

## 2021-10-22 HISTORY — PX: IR PARACENTESIS: IMG2679

## 2021-10-22 LAB — I-STAT VENOUS BLOOD GAS, ED
Acid-Base Excess: 2 mmol/L (ref 0.0–2.0)
Bicarbonate: 25.7 mmol/L (ref 20.0–28.0)
Calcium, Ion: 1.11 mmol/L — ABNORMAL LOW (ref 1.15–1.40)
HCT: 33 % — ABNORMAL LOW (ref 36.0–46.0)
Hemoglobin: 11.2 g/dL — ABNORMAL LOW (ref 12.0–15.0)
O2 Saturation: 100 %
Potassium: 3.3 mmol/L — ABNORMAL LOW (ref 3.5–5.1)
Sodium: 142 mmol/L (ref 135–145)
TCO2: 27 mmol/L (ref 22–32)
pCO2, Ven: 36.7 mmHg — ABNORMAL LOW (ref 44–60)
pH, Ven: 7.454 — ABNORMAL HIGH (ref 7.25–7.43)
pO2, Ven: 169 mmHg — ABNORMAL HIGH (ref 32–45)

## 2021-10-22 LAB — URINALYSIS, ROUTINE W REFLEX MICROSCOPIC
Bacteria, UA: NONE SEEN
Bilirubin Urine: NEGATIVE
Glucose, UA: NEGATIVE mg/dL
Ketones, ur: NEGATIVE mg/dL
Leukocytes,Ua: NEGATIVE
Nitrite: NEGATIVE
Protein, ur: NEGATIVE mg/dL
RBC / HPF: >50 RBC/hpf — ABNORMAL HIGH (ref 0–5)
Specific Gravity, Urine: 1.011 (ref 1.005–1.030)
pH: 5 (ref 5.0–8.0)

## 2021-10-22 LAB — CBC WITH DIFFERENTIAL/PLATELET
Abs Immature Granulocytes: 0.03 10*3/uL (ref 0.00–0.07)
Basophils Absolute: 0 10*3/uL (ref 0.0–0.1)
Basophils Relative: 0 %
Eosinophils Absolute: 0 10*3/uL (ref 0.0–0.5)
Eosinophils Relative: 0 %
HCT: 28.3 % — ABNORMAL LOW (ref 36.0–46.0)
Hemoglobin: 9 g/dL — ABNORMAL LOW (ref 12.0–15.0)
Immature Granulocytes: 0 %
Lymphocytes Relative: 5 %
Lymphs Abs: 0.5 10*3/uL — ABNORMAL LOW (ref 0.7–4.0)
MCH: 23.5 pg — ABNORMAL LOW (ref 26.0–34.0)
MCHC: 31.8 g/dL (ref 30.0–36.0)
MCV: 73.9 fL — ABNORMAL LOW (ref 80.0–100.0)
Monocytes Absolute: 0.4 10*3/uL (ref 0.1–1.0)
Monocytes Relative: 4 %
Neutro Abs: 9.5 10*3/uL — ABNORMAL HIGH (ref 1.7–7.7)
Neutrophils Relative %: 91 %
Platelets: 126 10*3/uL — ABNORMAL LOW (ref 150–400)
RBC: 3.83 MIL/uL — ABNORMAL LOW (ref 3.87–5.11)
RDW: 23.9 % — ABNORMAL HIGH (ref 11.5–15.5)
Smear Review: DECREASED
WBC: 10.5 10*3/uL (ref 4.0–10.5)
nRBC: 0 % (ref 0.0–0.2)

## 2021-10-22 LAB — COMPREHENSIVE METABOLIC PANEL
ALT: 18 U/L (ref 0–44)
AST: 29 U/L (ref 15–41)
Albumin: 4.1 g/dL (ref 3.5–5.0)
Alkaline Phosphatase: 70 U/L (ref 38–126)
Anion gap: 10 (ref 5–15)
BUN: 8 mg/dL (ref 6–20)
CO2: 21 mmol/L — ABNORMAL LOW (ref 22–32)
Calcium: 9.1 mg/dL (ref 8.9–10.3)
Chloride: 110 mmol/L (ref 98–111)
Creatinine, Ser: 0.72 mg/dL (ref 0.44–1.00)
GFR, Estimated: >60 mL/min (ref >60)
Glucose, Bld: 80 mg/dL (ref 70–99)
Potassium: 3.4 mmol/L — ABNORMAL LOW (ref 3.5–5.1)
Sodium: 141 mmol/L (ref 135–145)
Total Bilirubin: 2.4 mg/dL — ABNORMAL HIGH (ref 0.3–1.2)
Total Protein: 6.9 g/dL (ref 6.5–8.1)

## 2021-10-22 LAB — RETICULOCYTES
Immature Retic Fract: 23.2 % — ABNORMAL HIGH (ref 2.3–15.9)
RBC.: 3.95 MIL/uL (ref 3.87–5.11)
Retic Count, Absolute: 51 10*3/uL (ref 19.0–186.0)
Retic Ct Pct: 1.3 % (ref 0.4–3.1)

## 2021-10-22 LAB — HCG, QUANTITATIVE, PREGNANCY: hCG, Beta Chain, Quant, S: 8 m[IU]/mL — ABNORMAL HIGH (ref <5)

## 2021-10-22 LAB — TSH: TSH: 0.319 u[IU]/mL — ABNORMAL LOW (ref 0.350–4.500)

## 2021-10-22 LAB — I-STAT BETA HCG BLOOD, ED (MC, WL, AP ONLY): I-stat hCG, quantitative: 9.9 m[IU]/mL — ABNORMAL HIGH (ref <5)

## 2021-10-22 LAB — RESP PANEL BY RT-PCR (FLU A&B, COVID) ARPGX2
Influenza A by PCR: NEGATIVE
Influenza B by PCR: NEGATIVE
SARS Coronavirus 2 by RT PCR: NEGATIVE

## 2021-10-22 LAB — LACTIC ACID, PLASMA
Lactic Acid, Venous: 2.3 mmol/L (ref 0.5–1.9)
Lactic Acid, Venous: 2.3 mmol/L (ref 0.5–1.9)
Lactic Acid, Venous: 2.7 mmol/L (ref 0.5–1.9)

## 2021-10-22 LAB — PROTIME-INR
INR: 1.6 — ABNORMAL HIGH (ref 0.8–1.2)
Prothrombin Time: 19.2 seconds — ABNORMAL HIGH (ref 11.4–15.2)

## 2021-10-22 LAB — TROPONIN I (HIGH SENSITIVITY): Troponin I (High Sensitivity): 194 ng/L (ref <18)

## 2021-10-22 LAB — AMMONIA: Ammonia: 59 umol/L — ABNORMAL HIGH (ref 9–35)

## 2021-10-22 MED ORDER — ALBUMIN HUMAN 25 % IV SOLN
INTRAVENOUS | Status: AC
  Administered 2021-10-22: 75 g via INTRAVENOUS
  Filled 2021-10-22: qty 200

## 2021-10-22 MED ORDER — POTASSIUM CHLORIDE 10 MEQ/100ML IV SOLN
10.0000 meq | INTRAVENOUS | Status: AC
  Administered 2021-10-22: 10 meq via INTRAVENOUS
  Filled 2021-10-22 (×2): qty 100

## 2021-10-22 MED ORDER — FUROSEMIDE 10 MG/ML IJ SOLN
40.0000 mg | Freq: Once | INTRAMUSCULAR | Status: AC
  Administered 2021-10-22: 40 mg via INTRAVENOUS
  Filled 2021-10-22: qty 4

## 2021-10-22 MED ORDER — ALBUMIN HUMAN 25 % IV SOLN
INTRAVENOUS | Status: AC
  Filled 2021-10-22: qty 100

## 2021-10-22 MED ORDER — LIDOCAINE HCL 1 % IJ SOLN
INTRAMUSCULAR | Status: DC | PRN
  Administered 2021-10-22: 10 mL via INTRADERMAL

## 2021-10-22 MED ORDER — SODIUM CHLORIDE 0.9 % IV SOLN
500.0000 mg | INTRAVENOUS | Status: AC
  Administered 2021-10-22 – 2021-10-26 (×5): 500 mg via INTRAVENOUS
  Filled 2021-10-22 (×5): qty 5

## 2021-10-22 MED ORDER — LIDOCAINE HCL 1 % IJ SOLN
INTRAMUSCULAR | Status: AC
  Filled 2021-10-22: qty 20

## 2021-10-22 MED ORDER — SODIUM CHLORIDE 0.9 % IV SOLN
2.0000 g | INTRAVENOUS | Status: DC
  Administered 2021-10-23 – 2021-10-27 (×5): 2 g via INTRAVENOUS
  Filled 2021-10-22 (×5): qty 20

## 2021-10-22 MED ORDER — SODIUM CHLORIDE 0.9 % IV SOLN
2.0000 g | Freq: Once | INTRAVENOUS | Status: AC
  Administered 2021-10-22: 2 g via INTRAVENOUS
  Filled 2021-10-22: qty 20

## 2021-10-22 MED ORDER — NICOTINE 7 MG/24HR TD PT24
7.0000 mg | MEDICATED_PATCH | Freq: Every day | TRANSDERMAL | Status: DC
  Administered 2021-10-23 – 2021-11-08 (×16): 7 mg via TRANSDERMAL
  Filled 2021-10-22 (×18): qty 1

## 2021-10-22 MED ORDER — ALBUMIN HUMAN 25 % IV SOLN: 75.0000 g | Freq: Once | INTRAVENOUS | Status: AC

## 2021-10-22 NOTE — Assessment & Plan Note (Addendum)
likley hepatic in nature, was given a dose of lasix in ER ?Will monitor fluid status and renal function ?May need thoracentesis will order IR consult ?

## 2021-10-22 NOTE — ED Notes (Signed)
CRITICAL TROPONIN: Troponin 197. [place] MD MADE AWARE ?

## 2021-10-22 NOTE — Assessment & Plan Note (Signed)
-   Spoke about importance of quitting spent 5 minutes discussing options for treatment, prior attempts at quitting, and dangers of smoking ? -At this point patient is    interested in quitting ? - order nicotine patch  ? - nursing tobacco cessation protocol ? ?

## 2021-10-22 NOTE — Assessment & Plan Note (Signed)
Not on propranolol ?Will need to follow up w Gi ?

## 2021-10-22 NOTE — Progress Notes (Signed)
PA at bedside to speak with pt and pt husband ?

## 2021-10-22 NOTE — ED Provider Notes (Addendum)
?[facility] ?Provider Note ? ? ?CSN: [id number] ?Arrival date & time: 10/22/21  1402 ? ?  ? ?History ? ?Chief Complaint  ?Patient presents with  ? Shortness of Breath  ? ? ?Katie Holland is a 56 y.o. female. With past medical history of alcohol use disorder complicated by cirrhosis, esophageal varices, UGIB who presents to the emergency department with shortness of breath. ? ?States she was seen today for paracentesis when she became short of breath.  She states that the shortness of breath worsened after fluid was removed.  She denies any cough or fevers at home.  She states that this is happened once before last year.  She has had thoracentesis previously most recently last year.  She is was to be admitted tomorrow for TIPS procedure. ? ?On chart review she was seen today for IR paracentesis and had 9.8 L removed.  She had 75 g albumin given as well.  During paracentesis she was short of breath, hypertensive, tachycardic and hypoxic.  She also appeared more lethargic than her usual.  Sats were in the 80s and she was placed on 4 L nasal cannula.  They attempted to wean her off O2 but her sats dropped to the 80s again she was sent to the emergency department. ? ? ?Shortness of Breath ?Associated symptoms: abdominal pain and fever   ?Associated symptoms: no chest pain, no cough, no vomiting and no wheezing   ? ?  ? ?Home Medications ?Prior to Admission medications   ?Medication Sig Start Date End Date Taking? Authorizing Provider  ?furosemide (LASIX) 20 MG tablet Take 2 tablets (40 mg total) by mouth 2 (two) times daily. 09/11/21 12/10/21  Yetta Flock, MD  ?Multiple Vitamin (MULTIVITAMIN WITH MINERALS) TABS tablet Take 1 tablet by mouth daily.    [provider]  ?prednisoLONE acetate (PRED FORTE) 1 % ophthalmic suspension Place 1 drop into both eyes 2 (two) times daily.    [provider]  ?spironolactone (ALDACTONE) 100 MG tablet Take 2 tablets (200 mg  total) by mouth once daily 09/11/21   Armbruster, Carlota Raspberry, MD  ?   ? ?Allergies    ?Contrast media [iodinated contrast media], Latex, and Sulfa antibiotics   ? ?Review of Systems   ?Review of Systems  ?Constitutional:  Positive for fatigue and fever.  ?Respiratory:  Positive for shortness of breath. Negative for cough and wheezing.   ?Cardiovascular:  Negative for chest pain, palpitations and leg swelling.  ?Gastrointestinal:  Positive for abdominal pain. Negative for nausea and vomiting.  ?All other systems reviewed and are negative. ? ?Physical Exam ?Updated Vital Signs ?BP 130/79 (BP Location: Right Arm)   Pulse (!) 130   Temp (!) 100.6 ?F (38.1 ?C) (Oral)   Resp (!) 25   SpO2 93%  ?Physical Exam ?Vitals and nursing note reviewed.  ?Constitutional:   ?   General: She is in acute distress.  ?   Appearance: Normal appearance. She is well-developed. She is ill-appearing.  ?HENT:  ?   Head: Normocephalic and atraumatic.  ?   Mouth/Throat:  ?   Mouth: Mucous membranes are dry.  ?   Pharynx: Oropharynx is clear.  ?Eyes:  ?   General: Scleral icterus present.  ?   Extraocular Movements: Extraocular movements intact.  ?   Pupils: Pupils are equal, round, and reactive to light.  ?Cardiovascular:  ?   Rate and Rhythm: Regular rhythm. Tachycardia present.  ?   Pulses: Normal pulses.  ?  Heart sounds: No murmur heard. ?Pulmonary:  ?   Effort: Tachypnea present.  ?   Breath sounds: Examination of the right-middle field reveals decreased breath sounds. Examination of the right-lower field reveals decreased breath sounds and rhonchi. Examination of the left-lower field reveals rales. Decreased breath sounds, rhonchi and rales present.  ?Chest:  ?   Chest wall: No tenderness.  ?Abdominal:  ?   General: Bowel sounds are normal.  ?   Palpations: Abdomen is soft. There is hepatomegaly.  ?   Tenderness: There is abdominal tenderness. There is no guarding.  ?Musculoskeletal:     ?   General: Normal range of motion.  ?   Cervical  back: Normal range of motion and neck supple.  ?   Right lower leg: No edema.  ?   Left lower leg: No edema.  ?Skin: ?   General: Skin is warm and dry.  ?   Capillary Refill: Capillary refill takes less than 2 seconds.  ?Neurological:  ?   General: No focal deficit present.  ?   Mental Status: She is oriented to person, place, and time. She is lethargic.  ?   Cranial Nerves: Cranial nerves 2-12 are intact.  ? ? ?ED Results / Procedures / Treatments   ?Labs ?(all labs ordered are listed, but only abnormal results are displayed) ?Labs Reviewed  ?COMPREHENSIVE METABOLIC PANEL - Abnormal; Notable for the following components:  ?    Result Value  ? Potassium 3.4 (*)   ? CO2 21 (*)   ? Total Bilirubin 2.4 (*)   ? All other components within normal limits  ?LACTIC ACID, PLASMA - Abnormal; Notable for the following components:  ? Lactic Acid, Venous 2.3 (*)   ? All other components within normal limits  ?LACTIC ACID, PLASMA - Abnormal; Notable for the following components:  ? Lactic Acid, Venous 2.3 (*)   ? All other components within normal limits  ?CBC WITH DIFFERENTIAL/PLATELET - Abnormal; Notable for the following components:  ? RBC 3.83 (*)   ? Hemoglobin 9.0 (*)   ? HCT 28.3 (*)   ? MCV 73.9 (*)   ? MCH 23.5 (*)   ? RDW 23.9 (*)   ? Platelets 126 (*)   ? Neutro Abs 9.5 (*)   ? Lymphs Abs 0.5 (*)   ? All other components within normal limits  ?PROTIME-INR - Abnormal; Notable for the following components:  ? Prothrombin Time 19.2 (*)   ? INR 1.6 (*)   ? All other components within normal limits  ?URINALYSIS, ROUTINE W REFLEX MICROSCOPIC - Abnormal; Notable for the following components:  ? Hgb urine dipstick MODERATE (*)   ? RBC / HPF >50 (*)   ? All other components within normal limits  ?HCG, QUANTITATIVE, PREGNANCY - Abnormal; Notable for the following components:  ? hCG, Beta Chain, Quant, S 8 (*)   ? All other components within normal limits  ?I-STAT BETA HCG BLOOD, ED ([facility], [facility], [facility] ONLY) - Abnormal; Notable for the  following components:  ? I-stat hCG, quantitative 9.9 (*)   ? All other components within normal limits  ?RESP PANEL BY RT-PCR (FLU A&B, COVID) [id number]  ?CULTURE, BLOOD (ROUTINE X 2)  ?CULTURE, BLOOD (ROUTINE X 2)  ?AMMONIA  ?BLOOD GAS, VENOUS  ?CK  ?CREATININE, URINE, RANDOM  ?HEPATIC FUNCTION PANEL  ?HEPATITIS PANEL, ACUTE  ?MAGNESIUM  ?OSMOLALITY  ?OSMOLALITY, URINE  ?PHOSPHORUS  ?PREALBUMIN  ?SODIUM, URINE, RANDOM  ?TSH  ?URINALYSIS, COMPLETE (UACMP) WITH MICROSCOPIC  ?  VITAMIN B12  ?FOLATE  ?IRON AND TIBC  ?FERRITIN  ?RETICULOCYTES  ? ?EKG ?None ? ?Radiology ?DG Chest 2 View ? ?Result Date: 10/22/2021 ?CLINICAL DATA:  Suspected sepsis right pleural effusion EXAM: CHEST - 2 VIEW COMPARISON:  Previous studies including the examination of 01/21/2019 FINDINGS: Transverse diameter of heart is increased. Evaluation of right mid and right lower lung fields for infiltrates is limited by the effusion. Central pulmonary vessels are more prominent. There is prominence of interstitial markings in the left parahilar region, possibly mild interstitial edema. Large right pleural effusion is seen. There is no significant left pleural effusion. There is no pneumothorax. IMPRESSION: There is opacification of right mid and right lower lung fields suggesting large right pleural effusion. Possibility of underlying atelectasis/pneumonia is not excluded. Central pulmonary vessels are more prominent. Increased interstitial markings are seen in the parahilar regions suggesting CHF. Electronically Signed   By: Elmer Picker M.D.   On: 10/22/2021 16:08  ? ?IR Paracentesis ? ?Result Date: 10/22/2021 ?INDICATION: Patient with history of alcoholic cirrhosis with recurrent ascites. Request for therapeutic paracentesis with a 10 L max. EXAM: ULTRASOUND GUIDED THERAPEUTIC RIGHT LOWER QUADRANT PARACENTESIS MEDICATIONS: 10 mL 1 % lidocaine COMPLICATIONS: None immediate. PROCEDURE: Informed written consent was obtained from the patient after a  discussion of the risks, benefits and alternatives to treatment. A timeout was performed prior to the initiation of the procedure. Initial ultrasound scanning demonstrates a large amount of ascites within the

## 2021-10-22 NOTE — H&P (Addendum)
? ? ?Katie Holland UYQ:[id number] DOB: [birth year] DOA: 10/22/2021 ? ? ?  ?PCP: Billie Ruddy, MD   ?Outpatient Specialists:   ?  ?GI  Dr. Havery Moros ( [facility]) ?  ? ?Patient arrived to ER on 10/22/21 at 1402 ?Referred by Attending Toy Baker, MD ? ? ?Patient coming from:   ? home Lives With family ?  ? ?Chief Complaint:   ?Chief Complaint  ?Patient presents with  ? Shortness of Breath  ? ? ?HPI: ?Katie Holland is a 56 y.o. female with medical history significant of alcoholic liver failure, alcohol abuse, tobacco abuse, esophageal varices, upper GI bleed in the past. ?  ? ?Presented with   shortness of breath and increased oxygen requirement ?Patient today undergone US guided therapeutic paracentesis that yielded about 9.8 L of clear yellow fluid initially had tolerated well was administered 75 g of albumin ?Patient was asking if she can also get a thoracentesis done today because she was so short of breath noted to have elevated heart rate and high blood pressure even after paracentesis ?Noted to be satting 89% on room air and increase work of breathing placed on 4 L which improved oxygen saturation to 96% attempted to wean off of oxygen but dropped down to 80s again to remain on 4 L patient was sent to emergency department for further evaluation ?Also noted to be somewhat more lethargic than usual ?Patient at home on Lasix and spironolactone ?Also endorsing abdominal pain ?Does not drink, last ETOH was last 3 mont ?Still smokes 6-7 cig a day thinking of quitting ?No bleeding  ?Last thoracentesis was 9 m ago pt states ? ? Initial COVID TEST  ?NEGATIVE  ? ?Lab Results  ?Component Value Date  ? [place] NEGATIVE 10/22/2021  ? [place] NEGATIVE 08/03/2020  ? [place] NEGATIVE 07/28/2019  ? SARSCOV2NAA NOT DETECTED 03/05/2019  ? ?  ?Regarding pertinent Chronic problems:   ?  ? ? Liver disease MELD-Na score: 15   ?On lasix and spironolactone ?  ? Chronic anemia - baseline hg Hemoglobin & Hematocrit  ?Recent Labs   ?  02/08/21 ?1501 04/04/21 ?1449 10/22/21 ?1451  ?HGB 9.6* 9.7* 9.0*  ? ? ? ?While in ER: ?  ?Noted to be elevated lactic acid up to 2.3 ?Chest x-ray worrisome for right mid to right lower lung fields suggesting large right pleural effusion but could be also underlying pneumonia also possibly CHF ?GI was notified and plan to see patient in consult ?Also hypoxic blood cultures and sputum cultures ordered for ? ? ? ?Ordered ?  ? ?CXR -large right pleural effusion ? ?  ? ?CTA chest - Large right-sided pleural effusion with right middle and lower lobe ?Consolidation  ?Residual ascites in the abdomen following paracentesis. Cirrhotic ?changes of liver are seen. ? ?Following Medications were ordered in ER: ?Medications  ?azithromycin (ZITHROMAX) 500 mg in sodium chloride 0.9 % 250 mL IVPB (0 mg Intravenous Stopped 10/22/21 1850)  ?cefTRIAXone (ROCEPHIN) 2 g in sodium chloride 0.9 % 100 mL IVPB (has no administration in time range)  ?potassium chloride 10 mEq in 100 mL IVPB (has no administration in time range)  ?furosemide (LASIX) injection 40 mg (40 mg Intravenous Given 10/22/21 1647)  ?cefTRIAXone (ROCEPHIN) 2 g in sodium chloride 0.9 % 100 mL IVPB (0 g Intravenous Stopped 10/22/21 1748)  ?  ?_______________________________________________________ ?ER Provider Called:  [facility]  Dr. Tarri Glenn ?They Recommend admit to medicine   ?Will see in AM  ?  ?ED Triage Vitals  ?Enc  Vitals Group  ?   BP 10/22/21 1431 130/79  ?   Pulse Rate 10/22/21 1431 (!) 130  ?   Resp 10/22/21 1431 (!) 25  ?   Temp 10/22/21 1431 (!) 100.6 ?F (38.1 ?C)  ?   Temp Source 10/22/21 1431 Oral  ?   SpO2 10/22/21 1431 93 %  ?   Weight --   ?   Height --   ?   Head Circumference --   ?   Peak Flow --   ?   Pain Score 10/22/21 1429 8  ?   Pain Loc --   ?   Pain Edu? --   ?   Excl. in [place]? --   ?PQZR(00)@    ? _________________________________________ ?Significant initial  Findings: ?Abnormal Labs Reviewed  ?COMPREHENSIVE METABOLIC PANEL - Abnormal; Notable for the  following components:  ?    Result Value  ? Potassium 3.4 (*)   ? CO2 21 (*)   ? Total Bilirubin 2.4 (*)   ? All other components within normal limits  ?LACTIC ACID, PLASMA - Abnormal; Notable for the following components:  ? Lactic Acid, Venous 2.3 (*)   ? All other components within normal limits  ?LACTIC ACID, PLASMA - Abnormal; Notable for the following components:  ? Lactic Acid, Venous 2.3 (*)   ? All other components within normal limits  ?CBC WITH DIFFERENTIAL/PLATELET - Abnormal; Notable for the following components:  ? RBC 3.83 (*)   ? Hemoglobin 9.0 (*)   ? HCT 28.3 (*)   ? MCV 73.9 (*)   ? MCH 23.5 (*)   ? RDW 23.9 (*)   ? Platelets 126 (*)   ? Neutro Abs 9.5 (*)   ? Lymphs Abs 0.5 (*)   ? All other components within normal limits  ?PROTIME-INR - Abnormal; Notable for the following components:  ? Prothrombin Time 19.2 (*)   ? INR 1.6 (*)   ? All other components within normal limits  ?URINALYSIS, ROUTINE W REFLEX MICROSCOPIC - Abnormal; Notable for the following components:  ? Hgb urine dipstick MODERATE (*)   ? RBC / HPF >50 (*)   ? All other components within normal limits  ?HCG, QUANTITATIVE, PREGNANCY - Abnormal; Notable for the following components:  ? hCG, Beta Chain, Quant, S 8 (*)   ? All other components within normal limits  ?I-STAT BETA HCG BLOOD, ED ([facility], [facility], [facility] ONLY) - Abnormal; Notable for the following components:  ? I-stat hCG, quantitative 9.9 (*)   ? All other components within normal limits  ? ?  ?_________________________ ?Troponin  ordered ?ECG: Ordered ?Personally reviewed by me showing: ?HR : 110 ?Rhythm: Sinus tachycardia ?Borderline repolarization abnormality ?Minimal ST elevation, lateral leads ?QTC 474 ? ? ?____________________ ?This patient meets SIRS Criteria and may be septic. ?   ?The recent clinical data is shown below. ?Vitals:  ? 10/22/21 1630 10/22/21 1745 10/22/21 1830 10/22/21 1850  ?BP: 122/74 123/79 107/70   ?Pulse: (!) 126 (!) 119 (!) 114   ?Resp: (!) 31 (!) 24 (!)  26   ?Temp:    98.2 ?F (36.8 ?C)  ?TempSrc:    Oral  ?SpO2: 98% 98% 100%   ? ?   ?WBC ? ?   ?Component Value Date/Time  ? WBC 10.5 10/22/2021 1451  ? LYMPHSABS 0.5 (L) 10/22/2021 1451  ? MONOABS 0.4 10/22/2021 1451  ? EOSABS 0.0 10/22/2021 1451  ? BASOSABS 0.0 10/22/2021 1451  ? ?  ?Lactic  Acid, Venous ?   ?Component Value Date/Time  ? LATICACIDVEN 2.3 ([place]) 10/22/2021 1628  ? ? ? ?Procalcitonin   Ordered ?Lactic Acid, Venous ?   ?Component Value Date/Time  ? LATICACIDVEN 2.7 ([place]) 10/22/2021 2030  ?  ? ? UA   no evidence of UTI    ?  ?Urine analysis: ?   ?Component Value Date/Time  ? [place] YELLOW 10/22/2021 1458  ? APPEARANCEUR CLEAR 10/22/2021 1458  ? LABSPEC 1.011 10/22/2021 1458  ? PHURINE 5.0 10/22/2021 1458  ? GLUCOSEU NEGATIVE 10/22/2021 1458  ? HGBUR MODERATE (A) 10/22/2021 1458  ? [place] NEGATIVE 10/22/2021 1458  ? [place] NEGATIVE 10/22/2021 1458  ? PROTEINUR NEGATIVE 10/22/2021 1458  ? NITRITE NEGATIVE 10/22/2021 1458  ? LEUKOCYTESUR NEGATIVE 10/22/2021 1458  ? ? ?Results for orders placed or performed during the hospital encounter of 10/22/21  ?Resp Panel by RT-PCR (Flu A&B, Covid) Nasopharyngeal Swab     Status: None  ? Collection Time: 10/22/21  2:59 PM  ? Specimen: Nasopharyngeal Swab; Nasopharyngeal(NP) swabs in vial transport medium  ?Result Value Ref Range Status  ? SARS Coronavirus 2 by RT PCR NEGATIVE NEGATIVE Final  ?      ? Influenza A by PCR NEGATIVE NEGATIVE Final  ? Influenza B by PCR NEGATIVE NEGATIVE Final  ?      ?  ? ?_______________________________________________ ?Hospitalist was called for admission for CAP, and pleural effusion  ?  ? ?The following Work up has been ordered so far: ? ?Orders Placed This Encounter  ?Procedures  ? Culture, blood (Routine x 2)  ? Resp Panel by RT-PCR (Flu A&B, Covid) Nasopharyngeal Swab  ? Expectorated Sputum Assessment w Gram Stain, Rflx to Resp Cult  ? DG Chest 2 View  ? Comprehensive metabolic panel  ? Lactic acid, plasma  ? CBC with  Differential  ? Protime-INR  ? Urinalysis, Routine w reflex microscopic  ? hCG, quantitative, pregnancy  ? Ammonia  ? Blood gas, venous  ? CK  ? Creatinine, urine, random  ? Hepatic function panel  ? Hepatitis

## 2021-10-22 NOTE — Procedures (Signed)
PROCEDURE SUMMARY: ? ?Successful US guided therapeutic paracentesis from RLQ.  ?Yielded 9.8 L of clear, yellow fluid.  ?No immediate complications.  ?Pt tolerated well.  ? ?Specimen was sent for labs. ? ?EBL < 1 mL ? ?Tyson Alias, AGNP ?10/22/2021 ?12:45 PM ?  ?

## 2021-10-22 NOTE — ED Notes (Signed)
Called for triage and unable to locate ?

## 2021-10-22 NOTE — Progress Notes (Signed)
I called Katie Katie Holland's spouse, Katie Holland.  Mr. Katie Holland is with Katie Holland in  [facility] ED. I told them that I will call back later. Katie. Katie Holland had arrive to IR for paracentesis, patient was found to be more short of breath and more lethargic that last visit. Patient was sent to ED. ?

## 2021-10-22 NOTE — ED Notes (Signed)
Attempted to draw ammonia level, unsuccessful.  ?

## 2021-10-22 NOTE — ED Notes (Signed)
Lactic acid 2.3 reported to MD Tyrone Nine ?

## 2021-10-22 NOTE — Subjective & Objective (Signed)
Patient today undergone US guided therapeutic paracentesis that yielded about 9.8 L of clear yellow fluid initially had tolerated well was administered 75 g of albumin ?Patient was asking if she can also get a thoracentesis done today because she was so short of breath noted to have elevated heart rate and high blood pressure even after paracentesis ?Noted to be satting 89% on room air and increase work of breathing placed on 4 L which improved oxygen saturation to 96% attempted to wean off of oxygen but dropped down to 80s again to remain on 4 L patient was sent to emergency department for further evaluation ?Also noted to be somewhat more lethargic than usual ?

## 2021-10-22 NOTE — Assessment & Plan Note (Signed)
-   will replace and repeat in AM,  check magnesium level and replace as needed ° °

## 2021-10-22 NOTE — Assessment & Plan Note (Signed)
- -  Patient presenting with productive cough, fever   Hypoxia , and infiltrate in  lower lobe on chest x-ray ?-Infiltrate on CXR and 2-3 characteristics (fever, leukocytosis, purulent sputum) are consistent with pneumonia. ?-This appears to be most likely community-acquired pneumonia.  ?   ? will admit for treatment of CAP will start on appropriate antibiotic coverage. - Rocephin/azithromycin ?  Obtain:  sputum cultures, ?               ?                blood cultures and sputum cultures ordered  ?                 strep pneumo UA antigen,  ?                check for Legionella antigen. ?               Provide oxygen as needed.  ? ? ?

## 2021-10-22 NOTE — ED Notes (Signed)
Got patient into a gown on the monitor did ekg shown to Dr Regenia Skeeter patient is resting with call bell in reach family at bedside ?

## 2021-10-22 NOTE — Assessment & Plan Note (Signed)
likely demand ischemia CP on the right side likely due to pleural effusion and consolidation ?Will continue to cycle CE ?Had an echo done last month that showed no wall motion abnormality no CHF ? ?

## 2021-10-22 NOTE — Assessment & Plan Note (Signed)
Check T3 T4 levels ?

## 2021-10-22 NOTE — Assessment & Plan Note (Signed)
The setting of possible pneumonia ? this patient has acute respiratory failure with Hypoxia and   as documented by the presence of following: ?O2 saturatio< 90% on RA  ?Likely due to:  Pneumonia, versus pleural effusion provide O2 therapy and titrate as needed ? Continuous pulse ox ?  check Pulse ox with ambulation prior to discharge ?  may need  TC consult for home O2 set up ?   ?

## 2021-10-22 NOTE — Progress Notes (Signed)
Pt here today for paracentesis. Pt states that she would like para done today because she is so short of breath. Pt HR is elevated along with BP, see flowsheets.Pt is very short of breath despite removing fluid. 89% on RA. Pt breathing very labored ?

## 2021-10-22 NOTE — Progress Notes (Addendum)
Miss Katie Holland presented to [facility] radiology for outpatient therapeutic paracentesis today. ? ?Patient presented with shortness of breath, vitals notable for hypertension, sinus tachycardia, and hypoxia.  ?Patient is well known to IR for previous paracentesis, she also appeared more lethargic than her usual self.  ?Patient's O2 sat was in 80s, was placed on 4L O2 via [place] which brought sat up to 96-96%.  ?Patient underwent paracentesis which yielded 9.8 L but remained in sinus tac, tachypenic, hypertensive, and lethargic. Attempted to wean off O2, sat dropped to high 80s.  ?Patient remained on 4L O2.  ? ?Discussed with patient and her spouse, it does not appear to be safe for her to be discharged home today.  ?Recommended to go to ED for further evaluation.  ?Patient and spouse verbalized understanding.  ? ?[facility] ED charge RN was called and informed that the patient will be checking in shortly.  ? ?Tera Mater PA-C ?10/22/2021 1:37 PM ? ? ? ? ?

## 2021-10-22 NOTE — Assessment & Plan Note (Signed)
Sp recent large-volume paracentesis around 10 L were removed albumin was given. ?We will follow renal function after large-volume paracentesis wait for results to evaluate for possible SBP ? ?

## 2021-10-22 NOTE — Assessment & Plan Note (Signed)
-  SIRS criteria met with     ?   ?Component Value Date/Time  ? WBC 10.5 10/22/2021 1451  ? LYMPHSABS 0.5 (L) 10/22/2021 1451  ? tachycardia    fever   RR >20 ?Today's Vitals  ? 10/22/21 1630 10/22/21 1745 10/22/21 1830 10/22/21 1850  ?BP: 122/74 123/79 107/70   ?Pulse: (!) 126 (!) 119 (!) 114   ?Resp: (!) 31 (!) 24 (!) 26   ?Temp:    98.2 ?F (36.8 ?C)  ?TempSrc:    Oral  ?SpO2: 98% 98% 100%   ?PainSc:      ? ?There is no height or weight on file to calculate BMI. ?  ?The recent clinical data is shown below. ?Vitals:  ? 10/22/21 1630 10/22/21 1745 10/22/21 1830 10/22/21 1850  ?BP: 122/74 123/79 107/70   ?Pulse: (!) 126 (!) 119 (!) 114   ?Resp: (!) 31 (!) 24 (!) 26   ?Temp:    98.2 ?F (36.8 ?C)  ?TempSrc:    Oral  ?SpO2: 98% 98% 100%   ? ? ?   ?-Most likely source being , pulmonary, intra-abdominal,  ? Patient meeting criteria for Severe sepsis with  ?  evidence of end organ damage/organ dysfunction such as  ?  elevated lactic acid >2  ?   ?Component Value Date/Time  ? LATICACIDVEN 2.3 ([place]) 10/22/2021 1628  ? ? acute metabolic encephalopathy ? SBP<90 mmhg or MAP < 65 mmhg,  ? Acute hypoxia requiring new supplemental oxygen, SpO2: 100 % ?O2 Flow Rate (L/min): 4 L/min ?  ? - Obtain serial lactic acid and procalcitonin level. ? - Initiated IV antibiotics in ER: ?Antibiotics Given (last 72 hours)   ? Date/Time Action Medication Dose Rate  ? 10/22/21 1650 New Bag/Given  ? azithromycin (ZITHROMAX) 500 mg in sodium chloride 0.9 % 250 mL IVPB 500 mg 250 mL/hr  ? 10/22/21 1653 New Bag/Given  ? cefTRIAXone (ROCEPHIN) 2 g in sodium chloride 0.9 % 100 mL IVPB 2 g 200 mL/hr  ?  ? ? ?Will continue  on : rocephin, azithro ? ? - await results of blood and urine culture ? - Rehydrate aggressively  ?Intravenous fluids were administered    ?7:09 PM ? ?

## 2021-10-22 NOTE — Progress Notes (Signed)
I called ED to speak to Katie Holland's nurse, the phone was not answered.  I called Mr. Payton Mccallum, patient's husband, his understanding is that the plan is to admit Katie Kemnitz. ?

## 2021-10-22 NOTE — ED Triage Notes (Signed)
Pt arrives from IR where she got a paracentesis, with complaints of increased shob and chest tightness that started yesterday. Pt states that the paracentesis helped her with breathing some but not back to baseline. Pt arrives to ED on [facility] and more lethargic than normal per husband. Pt is AOx3. ?

## 2021-10-22 NOTE — Assessment & Plan Note (Signed)
Elevated ammonia with asterixis, can start lactulose ?

## 2021-10-22 NOTE — Assessment & Plan Note (Signed)
MELD-Na score: 15  ?Patient followed by GI ?Was planing for TIPS procedure by IR , will need GI consult ?

## 2021-10-23 ENCOUNTER — Inpatient Hospital Stay (HOSPITAL_COMMUNITY): Payer: Medicare HMO

## 2021-10-23 ENCOUNTER — Observation Stay (HOSPITAL_COMMUNITY): Admission: RE | Admit: 2021-10-23 | Payer: Medicare HMO | Source: Ambulatory Visit

## 2021-10-23 ENCOUNTER — Other Ambulatory Visit (HOSPITAL_COMMUNITY): Payer: Medicare HMO

## 2021-10-23 ENCOUNTER — Encounter (HOSPITAL_COMMUNITY)
Admission: EM | Disposition: A | Discharge status: Home / Self Care | Payer: Self-pay | Source: Home / Self Care | Attending: Family Medicine

## 2021-10-23 ENCOUNTER — Inpatient Hospital Stay (HOSPITAL_COMMUNITY): Admission: RE | Admit: 2021-10-23 | Payer: Medicare HMO | Source: Ambulatory Visit | Admitting: Interventional Radiology

## 2021-10-23 ENCOUNTER — Encounter (HOSPITAL_COMMUNITY): Payer: Self-pay | Admitting: Internal Medicine

## 2021-10-23 DIAGNOSIS — A419 Sepsis, unspecified organism: Secondary | ICD-10-CM | POA: Diagnosis not present

## 2021-10-23 DIAGNOSIS — J189 Pneumonia, unspecified organism: Secondary | ICD-10-CM | POA: Diagnosis not present

## 2021-10-23 DIAGNOSIS — R0602 Shortness of breath: Secondary | ICD-10-CM

## 2021-10-23 DIAGNOSIS — K7031 Alcoholic cirrhosis of liver with ascites: Secondary | ICD-10-CM | POA: Diagnosis not present

## 2021-10-23 DIAGNOSIS — R778 Other specified abnormalities of plasma proteins: Secondary | ICD-10-CM | POA: Diagnosis not present

## 2021-10-23 DIAGNOSIS — B9620 Unspecified Escherichia coli [E. coli] as the cause of diseases classified elsewhere: Secondary | ICD-10-CM

## 2021-10-23 DIAGNOSIS — R7881 Bacteremia: Secondary | ICD-10-CM

## 2021-10-23 DIAGNOSIS — R7989 Other specified abnormal findings of blood chemistry: Secondary | ICD-10-CM | POA: Diagnosis not present

## 2021-10-23 DIAGNOSIS — J9601 Acute respiratory failure with hypoxia: Secondary | ICD-10-CM | POA: Diagnosis not present

## 2021-10-23 DIAGNOSIS — J90 Pleural effusion, not elsewhere classified: Secondary | ICD-10-CM | POA: Diagnosis not present

## 2021-10-23 HISTORY — PX: IR THORACENTESIS ASP PLEURAL SPACE W/IMG GUIDE: IMG5380

## 2021-10-23 LAB — LACTATE DEHYDROGENASE, PLEURAL OR PERITONEAL FLUID: LD, Fluid: 94 U/L — ABNORMAL HIGH (ref 3–23)

## 2021-10-23 LAB — PROTEIN, PLEURAL OR PERITONEAL FLUID: Total protein, fluid: 3.4 g/dL

## 2021-10-23 LAB — BLOOD CULTURE ID PANEL (REFLEXED) - BCID2

## 2021-10-23 LAB — URINALYSIS, COMPLETE (UACMP) WITH MICROSCOPIC
Bilirubin Urine: NEGATIVE
Glucose, UA: NEGATIVE mg/dL
Ketones, ur: NEGATIVE mg/dL
Leukocytes,Ua: NEGATIVE
Nitrite: NEGATIVE
Protein, ur: NEGATIVE mg/dL
RBC / HPF: >50 RBC/hpf — ABNORMAL HIGH (ref 0–5)
Specific Gravity, Urine: 1.009 (ref 1.005–1.030)
pH: 7 (ref 5.0–8.0)

## 2021-10-23 LAB — COMPREHENSIVE METABOLIC PANEL
ALT: 17 U/L (ref 0–44)
AST: 33 U/L (ref 15–41)
Albumin: 2.9 g/dL — ABNORMAL LOW (ref 3.5–5.0)
Alkaline Phosphatase: 65 U/L (ref 38–126)
Anion gap: 7 (ref 5–15)
BUN: 9 mg/dL (ref 6–20)
CO2: 25 mmol/L (ref 22–32)
Calcium: 8.6 mg/dL — ABNORMAL LOW (ref 8.9–10.3)
Chloride: 110 mmol/L (ref 98–111)
Creatinine, Ser: 0.81 mg/dL (ref 0.44–1.00)
GFR, Estimated: >60 mL/min (ref >60)
Glucose, Bld: 92 mg/dL (ref 70–99)
Potassium: 3.6 mmol/L (ref 3.5–5.1)
Sodium: 142 mmol/L (ref 135–145)
Total Bilirubin: 1.8 mg/dL — ABNORMAL HIGH (ref 0.3–1.2)
Total Protein: 5.7 g/dL — ABNORMAL LOW (ref 6.5–8.1)

## 2021-10-23 LAB — BODY FLUID CELL COUNT WITH DIFFERENTIAL
Eos, Fluid: 0 %
Lymphs, Fluid: 67 %
Monocyte-Macrophage-Serous Fluid: 8 % — ABNORMAL LOW (ref 50–90)
Neutrophil Count, Fluid: 25 % (ref 0–25)
Total Nucleated Cell Count, Fluid: 294 cu mm (ref 0–1000)

## 2021-10-23 LAB — CBC WITH DIFFERENTIAL/PLATELET
Abs Immature Granulocytes: 0 10*3/uL (ref 0.00–0.07)
Basophils Absolute: 0.1 10*3/uL (ref 0.0–0.1)
Basophils Relative: 1 %
Eosinophils Absolute: 0.2 10*3/uL (ref 0.0–0.5)
Eosinophils Relative: 2 %
HCT: 30.7 % — ABNORMAL LOW (ref 36.0–46.0)
Hemoglobin: 9.8 g/dL — ABNORMAL LOW (ref 12.0–15.0)
Lymphocytes Relative: 19 %
Lymphs Abs: 1.7 10*3/uL (ref 0.7–4.0)
MCH: 23.8 pg — ABNORMAL LOW (ref 26.0–34.0)
MCHC: 31.9 g/dL (ref 30.0–36.0)
MCV: 74.5 fL — ABNORMAL LOW (ref 80.0–100.0)
Monocytes Absolute: 0.6 10*3/uL (ref 0.1–1.0)
Monocytes Relative: 7 %
Neutro Abs: 6.5 10*3/uL (ref 1.7–7.7)
Neutrophils Relative %: 71 %
Platelets: 117 10*3/uL — ABNORMAL LOW (ref 150–400)
RBC: 4.12 MIL/uL (ref 3.87–5.11)
RDW: 24.1 % — ABNORMAL HIGH (ref 11.5–15.5)
Smear Review: DECREASED
WBC: 9.1 10*3/uL (ref 4.0–10.5)
nRBC: 0 % (ref 0.0–0.2)

## 2021-10-23 LAB — IRON AND TIBC
Iron: 66 ug/dL (ref 28–170)
Saturation Ratios: 37 % — ABNORMAL HIGH (ref 10.4–31.8)
TIBC: 178 ug/dL — ABNORMAL LOW (ref 250–450)
UIBC: 112 ug/dL

## 2021-10-23 LAB — TROPONIN I (HIGH SENSITIVITY)
Troponin I (High Sensitivity): 210 ng/L (ref <18)
Troponin I (High Sensitivity): 216 ng/L (ref <18)
Troponin I (High Sensitivity): 217 ng/L (ref <18)
Troponin I (High Sensitivity): 234 ng/L (ref <18)

## 2021-10-23 LAB — HEPATITIS PANEL, ACUTE
HCV Ab: NONREACTIVE
Hep A IgM: NONREACTIVE
Hep B C IgM: NONREACTIVE
Hepatitis B Surface Ag: NONREACTIVE

## 2021-10-23 LAB — STREP PNEUMONIAE URINARY ANTIGEN: Strep Pneumo Urinary Antigen: NEGATIVE

## 2021-10-23 LAB — PHOSPHORUS
Phosphorus: 3.3 mg/dL (ref 2.5–4.6)
Phosphorus: 2.8 mg/dL (ref 2.5–4.6)

## 2021-10-23 LAB — ECHOCARDIOGRAM COMPLETE
AR max vel: 1.52 cm2
AV Area VTI: 1.49 cm2
AV Area mean vel: 1.44 cm2
AV Mean grad: 4 mmHg
AV Peak grad: 7.7 mmHg
Ao pk vel: 1.39 m/s
Area-P 1/2: 5.31 cm2
S' Lateral: 2.6 cm

## 2021-10-23 LAB — HEPATIC FUNCTION PANEL
ALT: 18 U/L (ref 0–44)
AST: 34 U/L (ref 15–41)
Albumin: 3 g/dL — ABNORMAL LOW (ref 3.5–5.0)
Alkaline Phosphatase: 58 U/L (ref 38–126)
Bilirubin, Direct: 0.6 mg/dL — ABNORMAL HIGH (ref 0.0–0.2)
Indirect Bilirubin: 1.7 mg/dL — ABNORMAL HIGH (ref 0.3–0.9)
Total Bilirubin: 2.3 mg/dL — ABNORMAL HIGH (ref 0.3–1.2)
Total Protein: 5.7 g/dL — ABNORMAL LOW (ref 6.5–8.1)

## 2021-10-23 LAB — LACTIC ACID, PLASMA: Lactic Acid, Venous: 3.8 mmol/L (ref 0.5–1.9)

## 2021-10-23 LAB — GLUCOSE, PLEURAL OR PERITONEAL FLUID: Glucose, Fluid: 97 mg/dL

## 2021-10-23 LAB — GRAM STAIN

## 2021-10-23 LAB — OSMOLALITY, URINE: Osmolality, Ur: 413 mOsm/kg (ref 300–900)

## 2021-10-23 LAB — T4, FREE: Free T4: 1.02 ng/dL (ref 0.61–1.12)

## 2021-10-23 LAB — HIV ANTIBODY (ROUTINE TESTING W REFLEX): HIV Screen 4th Generation wRfx: NONREACTIVE

## 2021-10-23 LAB — CREATININE, URINE, RANDOM: Creatinine, Urine: 31.78 mg/dL

## 2021-10-23 LAB — PREALBUMIN: Prealbumin: <5 mg/dL — ABNORMAL LOW (ref 18–38)

## 2021-10-23 LAB — SODIUM, URINE, RANDOM: Sodium, Ur: 156 mmol/L

## 2021-10-23 LAB — ALBUMIN, PLEURAL OR PERITONEAL FLUID: Albumin, Fluid: <1.5 g/dL

## 2021-10-23 LAB — FOLATE: Folate: 14.3 ng/mL (ref >5.9)

## 2021-10-23 LAB — MAGNESIUM
Magnesium: 1.5 mg/dL — ABNORMAL LOW (ref 1.7–2.4)
Magnesium: 2.2 mg/dL (ref 1.7–2.4)

## 2021-10-23 LAB — PROCALCITONIN: Procalcitonin: 3.38 ng/mL

## 2021-10-23 LAB — CK: Total CK: 79 U/L (ref 38–234)

## 2021-10-23 LAB — FERRITIN: Ferritin: 22 ng/mL (ref 11–307)

## 2021-10-23 LAB — OSMOLALITY: Osmolality: 293 mOsm/kg (ref 275–295)

## 2021-10-23 LAB — VITAMIN B12: Vitamin B-12: 147 pg/mL — ABNORMAL LOW (ref 180–914)

## 2021-10-23 SURGERY — IR WITH ANESTHESIA
Anesthesia: General

## 2021-10-23 MED ORDER — LIDOCAINE HCL 1 % IJ SOLN
INTRAMUSCULAR | Status: AC
  Filled 2021-10-23: qty 20

## 2021-10-23 MED ORDER — OXYCODONE HCL 5 MG PO TABS
5.0000 mg | ORAL_TABLET | Freq: Once | ORAL | Status: AC
  Administered 2021-10-23: 5 mg via ORAL
  Filled 2021-10-23: qty 1

## 2021-10-23 MED ORDER — CYANOCOBALAMIN 1000 MCG/ML IJ SOLN
1000.0000 ug | Freq: Once | INTRAMUSCULAR | Status: AC
  Administered 2021-10-23: 1000 ug via INTRAMUSCULAR
  Filled 2021-10-23: qty 1

## 2021-10-23 MED ORDER — MAGNESIUM SULFATE 2 GM/50ML IV SOLN
2.0000 g | Freq: Once | INTRAVENOUS | Status: AC
  Administered 2021-10-23: 2 g via INTRAVENOUS
  Filled 2021-10-23: qty 50

## 2021-10-23 MED ORDER — LACTULOSE 10 GM/15ML PO SOLN
20.0000 g | Freq: Two times a day (BID) | ORAL | Status: DC
  Administered 2021-10-23 – 2021-10-24 (×2): 20 g via ORAL
  Filled 2021-10-23 (×2): qty 30

## 2021-10-23 MED ORDER — SPIRONOLACTONE 100 MG PO TABS
200.0000 mg | ORAL_TABLET | Freq: Every day | ORAL | Status: DC
  Administered 2021-10-23 – 2021-10-25 (×3): 200 mg via ORAL
  Filled 2021-10-23 (×4): qty 2
  Filled 2021-10-23: qty 8

## 2021-10-23 MED ORDER — VITAMIN B-12 1000 MCG PO TABS
1000.0000 ug | ORAL_TABLET | Freq: Every day | ORAL | Status: DC
  Administered 2021-10-24 – 2021-11-08 (×15): 1000 ug via ORAL
  Filled 2021-10-23 (×15): qty 1

## 2021-10-23 MED ORDER — FUROSEMIDE 40 MG PO TABS
40.0000 mg | ORAL_TABLET | Freq: Two times a day (BID) | ORAL | Status: DC
  Administered 2021-10-23 – 2021-10-24 (×2): 40 mg via ORAL
  Filled 2021-10-23: qty 1
  Filled 2021-10-23: qty 2

## 2021-10-23 MED ORDER — CYANOCOBALAMIN 1000 MCG/ML IJ SOLN
1000.0000 ug | Freq: Once | INTRAMUSCULAR | Status: DC
  Filled 2021-10-23: qty 1

## 2021-10-23 MED ORDER — LIDOCAINE HCL (PF) 1 % IJ SOLN
INTRAMUSCULAR | Status: DC | PRN
  Administered 2021-10-23: 5 mL

## 2021-10-23 MED ORDER — SPIRONOLACTONE 100 MG PO TABS
200.0000 mg | ORAL_TABLET | Freq: Every day | ORAL | Status: DC
  Filled 2021-10-23: qty 2

## 2021-10-23 NOTE — Progress Notes (Signed)
?PROGRESS NOTE ? ? ? ?Katie Holland  WGN:[id number] DOB: 1965-11-25 DOA: 10/22/2021 ?PCP: Billie Ruddy, MD  ? ?Brief Narrative:  ?The patient is a 56 year old African-American female with past medical history significant for but not limited to alcoholic liver failure, history of alcohol abuse, history of tobacco abuse, history of esophageal varices, history of upper GI bleed as well as other comorbidities who presented with worsening shortness of breath and increased O2 requirement.  She underwent an ultrasound-guided therapeutic paracentesis that yielded about 9.8 L of clear yellow fluid and had tolerated well and was administered 75 g of albumin.  Subsequently she is noted to be short of breath and had an elevated heart rate and high blood pressure even after paracentesis.  She is noted to be saturating 89% and increased work of bleeding and to be placed on 4 L of supplemental oxygen which improved her O2 saturations 96%.  There is attempt to wean her from her 4 L but because she failed to do so she was sent the ED for further evaluation.  She is noted to be somewhat more lethargic than usual and she was on home Lasix and spironolactone.  She also had some abdominal pain and does not drink anymore and her last drink was 3 months ago.  She continues to smoke cigarettes 6 to 7 cigarettes a day and think about quitting.  She had no bleeding and her last thoracentesis was 9 months ago she stated.  While in the ED she is noted to have a lactic acid of 2.3 and a chest x-ray that was worrisome for her right middle right lower lobe large right pleural effusion but also could be pneumonia versus CHF.  GI was notified of patient's admission and blood cultures and sputum cultures were obtained.  She underwent a CT of the chest which showed a right large sided pleural effusion with right middle lobe and right lower lobe consolidation as well as residual ascites in the abdomen following paracentesis.  She had cirrhotic changes  of the liver that were seen.  She is admitted for severe sepsis given that her lactic acid was level greater than 2 and that she had new oxygen requirement of 4 L.  She was placed on antibiotics azithromycin and ceftriaxone.  She is found to have 1 out of 4 bottles of E. coli bacteremia with possible pneumonia and she is continued on ceftriaxone and azithromycin.  IR was also consulted and she underwent a 1.8 L thoracentesis and there was question removed with there this was a hepatic hydrothorax.  She is continue on ceftriaxone and azithromycin and she did have large ascites that is refractory to high-dose diuretics.  GI evaluated and recommended resuming her home diuretic doses of Lasix 40 mg twice daily spironolactone 20 mg daily follow-up for a low sodium diet.  Patient was stressed to get a TIPS however this was canceled in the setting of her bacteremia  ? ? ?Assessment and Plan: ? ?Severe sepsis in the setting of E. coli bacteremia with concomitant pneumonia ?-Had a new O2 requirement and was found to have a very large consolidation and pleural effusion which was tapped and revealed 1.9  ?-Blood cultures grew 1 out of 4 E. coli and she is currently on IV ceftriaxone and azithromycin which we will continue ?-Source of infection is likely her pulmonary/intra-abdominal and she did meet severe sepsis criteria given that she had endorgan damage with a lactic acid of 2.3 which then trended up to 3.8.  She did have a new O2 requirement and had acute respiratory failure with hypoxia ?-Continue antibiotics and continue evaluation.  She is given IV fluid hydration however now will discontinue given that she is going to be aggressively diuresed ?-Patient had a community-acquired pneumonia as she presented with productive cough, fever, hypoxia and infiltrate in the right lower lobe chest x-ray consistent with pneumonia.  We will check a strep pneumo urine antigen and urine Legionella antigen ?-Procalcitonin level was  elevated 3.38 and she had an elevated lactic Level and it trended up from 2.3 -> 3.8 ? ?Acute respiratory failure with hypoxia in the setting of pleural effusion and community pneumonia ?-Continue with treatment as above with antibiotics ?-Continuous pulse oximetry maintain O2 saturation greater than 90% ?-Continue supplemental oxygen via nasal cannula wean O2 as tolerated ?SpO2: 100 % ?O2 Flow Rate (L/min): 4 L/min ?-Repeat chest x-ray in the a.m. and she will need an ambulatory home O2 screen prior to discharge ? ?Decompensated Alcoholic cirrhosis of the liver with ascites that is refractory to Diuretics ?Hyperbilirubinemia ?-Her MELD NA score was 15 and she is followed by GI ?-She is possibly having a TIPS procedure by IR but GIs been consulted and this will be placed on hold given her new bacteremia ?-GI is following and she has had serial large-volume paracentesis at least twice a month.  Her last paracentesis yesterday yielded 9.8 L.  Current nucleated cell count was 294 with 25% neutrophils but she did not meet criteria for SBP.  She continues to have right-sided tenderness on examination. ?-Patient's T. bili went from 2.3 is now 1.8 ?-GI is recommending resuming her home diuretics of 40 mg of Lasix twice daily and spironolactone 20 mg daily allowing for low-sodium diet; continue to follow renal function ?-She did receive 1 dose of IV Lasix for milligrams in the ED ?-Patient was to get a TIPS this was electively scheduled however this is now canceled given that the timing of the future this needs to be per IR recommendations given the clearance of her bacteremia; given recurrent bacteremia and acute illness there is no urgent need for TIPS procedure and IR recommending once she is fully recovered can consider rescheduling the procedure ? ?Esophageal varices ?-Gastroenterology following ?-Currently not on propanolol ? ?Tobacco abuse and dependence ?-Smoking cessation counseling given ?-Nicotine patch has been  ordered ? ?Hyperammonemia ?-She had an elevated ammonia level at 59 with asterixis and so lactulose was started at 20 grams po BID ? ?Elevated troponin ?-Likely demand ischemia; she had chest pain on right side which was due to a pleural effusion and consolidation ?-Recently had echo done last month which showed normal motion abnormalities and no evidence of CHF she is getting a repeat echocardiogram now ?-Troponins were cycled and they were elevated but relatively flat as they went from 217 -> 234 -> 210 -> 216 ? ?Decreased TSH ?-TSH was 0.319 but free T4 was 1.02 ?-Continue to monitor and trend and repeat TFTs in the next 4 to 6 weeks ? ?Microcytic Anemia ?B12 deficiency ?-Patient's hemoglobin/hematocrit went from 9.0/28.3 -> 11.2/33.0 -> 9.8/30.7 with MCV of 74.5 ?-Checked anemia panel and showed an iron level of 66, U IBC 112, TIBC 178, saturation ratios of 37%, ferritin level 22, folate level 14.3, and vitamin B12 level of 147 ?-Continue to monitor for signs and symptoms of bleeding; no overt bleeding noted ?-GI will supplement B12 ?-Repeat CBC in a.m. ? ?Thrombocytopenia ?-Likely chronic in setting of her cirrhosis and splenic sequestration ?-Patient's  platelet count went from 126 is now 117 ?-Continue to monitor for signs and symptoms bleeding; no overt bleeding noted ?-Repeat CBC in a.m. ? ?Elevated lactic acid level ?In the setting of liver failure and increased work of brething ?If evidence of hypotension can administer albumin ?Aggressive fluid resusition will likely result in worsening third spacing ?Treat CAP with antibiotics ? ?DVT prophylaxis: SCDs ? ?  Code Status: DNR ?Family Communication: Discussed with family present at bedside ? ?Disposition Plan:  ?Level of care: Progressive ?Status is: Inpatient ?Remains inpatient appropriate because: Needs further clinical improvement and clearance by specialists  ?  ?Consultants:  ?Gastroenterology ?IR ? ?Procedures:  ?Thoracentesis   ?ECHOCARDIOGRAM ?IMPRESSIONS  ? ? ? 1. Left ventricular ejection fraction, by estimation, is 65 to 70%. The  ?left ventricle has normal function. The left ventricle has no regional  ?wall motion abnormalities. Left ventricular d

## 2021-10-23 NOTE — Anesthesia Preprocedure Evaluation (Deleted)
Anesthesia Evaluation  ? ? ?Reviewed: ?Allergy & Precautions, Patient's Chart, lab work & pertinent test results ? ?Airway ? ? ? ? ? ? ? Dental ?  ?Pulmonary ?pneumonia, Current Smoker,  ?This AM right thoracentesis- yielded 1.9 L of hazy yellow fluid. ?Found to have Escherichia coli in 1 of 4 bottles, possible pneumonia ?89% on room air and increase work of breathing  ?  ? ? ? ? ? ? ? Cardiovascular ?negative cardio ROS ? ? ? ? ?  ?Neuro/Psych ? Headaches, negative psych ROS  ? GI/Hepatic ?(+) Cirrhosis  ? ascites ? substance abuse ? alcohol use, INR 1.6 ?Last paracentesis yesterday 10L  ?  ?Endo/Other  ?negative endocrine ROS ? Renal/GU ?negative Renal ROS  ?negative genitourinary ?  ?Musculoskeletal ?negative musculoskeletal ROS ?(+)  ? Abdominal ?  ?Peds ? Hematology ? ?(+) Blood dyscrasia, anemia , Hb 9.8, plt 126   ?Anesthesia Other Findings ? ? Reproductive/Obstetrics ?negative OB ROS ? ?  ? ? ? ? ? ? ? ? ? ? ? ? ? ?  ?  ? ? ? ? ? ? ? ? ?Anesthesia Physical ?Anesthesia Plan ? ?ASA: 4 ? ?Anesthesia Plan: General  ? ?Post-op Pain Management:   ? ?Induction: Intravenous ? ?PONV Risk Score and Plan: 2 and Ondansetron, Dexamethasone, Midazolam and Treatment may vary due to age or medical condition ? ?Airway Management Planned: Oral ETT ? ?Additional Equipment: Arterial line ? ?Intra-op Plan:  ? ?Post-operative Plan: Extubation in OR ? ?Informed Consent: I have reviewed the patients History and Physical, chart, labs and discussed the procedure including the risks, benefits and alternatives for the proposed anesthesia with the patient or authorized representative who has indicated his/her understanding and acceptance.  ? ? ? ?Dental advisory given ? ?Plan Discussed with: CRNA ? ?Anesthesia Plan Comments:   ? ? ? ? ? ? ?Anesthesia Quick Evaluation ? ?

## 2021-10-23 NOTE — ED Notes (Signed)
Patient has returned from IR. 

## 2021-10-23 NOTE — ED Notes (Signed)
Transportation arrived to take patient to IR for thoracentesis. ? ?

## 2021-10-23 NOTE — Progress Notes (Signed)
Aldactone dose not available in both pyxis on [place], pharmacy notified and will send but has not arrived  ?

## 2021-10-23 NOTE — ED Notes (Signed)
CRITICAL LAB: LACTIC ACID 3.8. Olancha MD MADE AWARE  ?

## 2021-10-23 NOTE — Assessment & Plan Note (Signed)
In the setting of liver failure and increased work of brething ?If evidence of hypotension can administer albumin ?Aggressive fluid resusition will likely result in worsening third spacing ?Treat CAP with antibiotics ? ?

## 2021-10-23 NOTE — ED Notes (Signed)
Patient returned, MRI cancelled.  ?

## 2021-10-23 NOTE — Hospital Course (Addendum)
The patient is a 56 year old African-American female with past medical history significant for but not limited to alcoholic liver failure, history of alcohol abuse, history of tobacco abuse, history of esophageal varices, history of upper GI bleed as well as other comorbidities who presented with worsening shortness of breath and increased O2 requirement.  She underwent an ultrasound-guided therapeutic paracentesis that yielded about 9.8 L of clear yellow fluid and had tolerated well and was administered 75 g of albumin.  Subsequently she is noted to be short of breath and had an elevated heart rate and high blood pressure even after paracentesis.  She is noted to be saturating 89% and increased work of bleeding and to be placed on 4 L of supplemental oxygen which improved her O2 saturations 96%.  There is attempt to wean her from her 4 L but because she failed to do so she was sent the ED for further evaluation.  She is noted to be somewhat more lethargic than usual and she was on home Lasix and spironolactone.  She also had some abdominal pain and does not drink anymore and her last drink was 3 months ago.  She continues to smoke cigarettes 6 to 7 cigarettes a day and think about quitting.  She had no bleeding and her last thoracentesis was 9 months ago she stated.  While in the ED she is noted to have a lactic acid of 2.3 and a chest x-ray that was worrisome for her right middle right lower lobe large right pleural effusion but also could be pneumonia versus CHF.  GI was notified of patient's admission and blood cultures and sputum cultures were obtained.  She underwent a CT of the chest which showed a right large sided pleural effusion with right middle lobe and right lower lobe consolidation as well as residual ascites in the abdomen following paracentesis.  She had cirrhotic changes of the liver that were seen.  She is admitted for severe sepsis given that her lactic acid was level greater than 2 and that she  had new oxygen requirement of 4 L.  She was placed on antibiotics azithromycin and ceftriaxone.  She is found to have 1 out of 4 bottles of E. coli bacteremia with possible pneumonia and she is continued on ceftriaxone and azithromycin.  IR was also consulted and she underwent a 1.8 L thoracentesis and there was question removed with there this was a hepatic hydrothorax.  She is continue on ceftriaxone and azithromycin and she did have large ascites that is refractory to high-dose diuretics.  GI evaluated and recommended resuming her home diuretic doses of Lasix 40 mg twice daily spironolactone 20 mg daily follow-up for a low sodium diet.  Patient was stressed to get a TIPS however this was canceled in the setting of her bacteremia ? ?GI has initiated her back on her home diuretics but if her renal function is okay there we will try increasing as she tolerates tomorrow.  They are recommending a discharge that she stay on Lasix 40 mg twice daily and spironolactone 200 g daily but they may increase it while she is in house.  Repeat chest x-ray yesterday showed a moderate pleural effusion again and today CXR showed "Large right pleural effusion, increased in size since the study from 1 day prior with worsened aeration of the right lung." ? ?Repeat CXR showed worsening large right pleural effusion and because of her shortness of breath while ambulation a repeat thoracentesis was ordered.  GI recommending continuing diuretics  and may increase the dose and recommending continuing low-sodium diet. ? ?She is s/p Thoracentesis again and 1 Liter was removed on 10/25/21.  GI has increased her diuretics to 40 mg of Lasix 3 times daily and 300 mg spironolactone daily however yesterday when I  walked in the room she was eating a large bag of L[employer] potato chips so I advised her about her salt intake. ? ?10/27/21 she was actively vomiting and retching and did not feel as good. She is improved 10/28/21 but back on Supplemental O2 and  complains of slight SOB.  GI will reach out to interventional radiology about timing of the TIPS and will continue antibiotics for 7 days for her E. coli bacteremia.  GI recommending continuing current dose of diuretics and not escalating dose yet ? ?10/28/21 Had worsening of he Pleural Effusion and had some dyspnea so Pulmonary was consulted who feels that she has a hepatic hydrothorax and recommending definitive management with TIPS but recommending another thoracentesis for comfort if she becomes more dyspenic.  GI spoken with IR and they will write a note outlining the plans regarding future TIPS.  Her antibiotics were switched from ceftriaxone to Ancef for 2 more day to complete 7 days of gram-negative coverage. ?

## 2021-10-23 NOTE — Procedures (Signed)
PROCEDURE SUMMARY: ? ?Successful image-guided right thoracentesis. ?Yielded 1.9 L of hazy yellow fluid. ?Pt tolerated procedure well. ?No immediate complications. ?EBL = trace  ? ?Specimen was sent for labs. ?CXR ordered. ? ?Please see imaging section of Epic for full dictation. ? ?Tera Mater PA-C ?10/23/2021 ?9:13 AM ? ? ? ?

## 2021-10-23 NOTE — Progress Notes (Signed)
HOSPITAL MEDICINE OVERNIGHT EVENT NOTE   ? ?Notified by nursing that patient has been complaining of worsening abdominal pain.  Patient describes the pain as sharp in quality and diffuse.  Pain has been progressively worsening since she underwent a paracentesis on 5/2 during which 9.8 L of fluid were removed by interventional radiology. ? ?Nursing reports that abdomen is protuberant and tender but diffusely soft. ? ?Chart reviewed, patient is currently receiving Intravenous antibiotics for sepsis secondary to E. coli bacteremia as well as pneumonia including ceftriaxone and azithromycin.  If patient was suffering from SBP this would be covered by the current antibiotic regimen. ? ?Considering recent paracentesis we will obtain a stat abdominal x-ray to ensure there is no evidence of perforation.  If negative we will go ahead and administer a dose of oxycodone and monitor closely. ? ?Katie Emerald  MD ?[facility]  ? ? ? ? ? ? ? ? ? ? ?

## 2021-10-23 NOTE — Progress Notes (Signed)
Roni, ED RN called to give report on patient coming to pre-op for TIPS procedure. Roni questioned if patient was to come today because a doctor had told the patient that she was not having procedure today because 12 L of fluid had been removed in the past two days. Radiology called and PA stated patient was not going to have TIPS today and would return at a later date as an outpatient for procedure. ED RN, OR staff, and anesthesia called and notified of cancellation.  ?

## 2021-10-23 NOTE — Progress Notes (Signed)
?  Echocardiogram ?2D Echocardiogram has been performed. ? ?Joette Catching ?10/23/2021, 11:04 AM ?

## 2021-10-23 NOTE — Progress Notes (Signed)
PHARMACY - PHYSICIAN COMMUNICATION ?CRITICAL VALUE ALERT - BLOOD CULTURE IDENTIFICATION (BCID) ? ?Katie Holland is an 56 y.o. female who presented to [facility] on 10/22/2021 with a chief complaint of shortness of breath. ? ?Assessment:  patient with alcoholic cirrhosis here for paracentesis complaining of shortness of breath. Found to have Escherichia coli in 1 of 4 bottles, possible pneumonia ? ?Name of physician (or Provider) Contacted: Claybon Jabs ? ?Current antibiotics: ceftriaxone and azithromycin ? ?Changes to prescribed antibiotics recommended: Patient is on recommended antibiotics - No changes needed ? ?Results for orders placed or performed during the hospital encounter of 10/22/21  ?Blood Culture ID Panel (Reflexed) (Collected: 10/22/2021  4:20 PM)  ?Result Value Ref Range  ? Enterococcus faecalis NOT DETECTED NOT DETECTED  ? Enterococcus Faecium NOT DETECTED NOT DETECTED  ? Listeria monocytogenes NOT DETECTED NOT DETECTED  ? Staphylococcus species NOT DETECTED NOT DETECTED  ? Staphylococcus aureus (BCID) NOT DETECTED NOT DETECTED  ? Staphylococcus epidermidis NOT DETECTED NOT DETECTED  ? Staphylococcus lugdunensis NOT DETECTED NOT DETECTED  ? Streptococcus species NOT DETECTED NOT DETECTED  ? Streptococcus agalactiae NOT DETECTED NOT DETECTED  ? Streptococcus pneumoniae NOT DETECTED NOT DETECTED  ? Streptococcus pyogenes NOT DETECTED NOT DETECTED  ? A.calcoaceticus-baumannii NOT DETECTED NOT DETECTED  ? Bacteroides fragilis NOT DETECTED NOT DETECTED  ? Enterobacterales DETECTED (A) NOT DETECTED  ? Enterobacter cloacae complex NOT DETECTED NOT DETECTED  ? Escherichia coli DETECTED (A) NOT DETECTED  ? Klebsiella aerogenes NOT DETECTED NOT DETECTED  ? Klebsiella oxytoca NOT DETECTED NOT DETECTED  ? Klebsiella pneumoniae NOT DETECTED NOT DETECTED  ? Proteus species NOT DETECTED NOT DETECTED  ? Salmonella species NOT DETECTED NOT DETECTED  ? Serratia marcescens NOT DETECTED NOT DETECTED  ? Haemophilus influenzae NOT  DETECTED NOT DETECTED  ? Neisseria meningitidis NOT DETECTED NOT DETECTED  ? Pseudomonas aeruginosa NOT DETECTED NOT DETECTED  ? Stenotrophomonas maltophilia NOT DETECTED NOT DETECTED  ? Candida albicans NOT DETECTED NOT DETECTED  ? Candida auris NOT DETECTED NOT DETECTED  ? Candida glabrata NOT DETECTED NOT DETECTED  ? Candida krusei NOT DETECTED NOT DETECTED  ? Candida parapsilosis NOT DETECTED NOT DETECTED  ? Candida tropicalis NOT DETECTED NOT DETECTED  ? Cryptococcus neoformans/gattii NOT DETECTED NOT DETECTED  ? CTX-M ESBL NOT DETECTED NOT DETECTED  ? Carbapenem resistance IMP NOT DETECTED NOT DETECTED  ? Carbapenem resistance KPC NOT DETECTED NOT DETECTED  ? Carbapenem resistance NDM NOT DETECTED NOT DETECTED  ? Carbapenem resist OXA 48 LIKE NOT DETECTED NOT DETECTED  ? Carbapenem resistance VIM NOT DETECTED NOT DETECTED  ? ? ?Jodean Lima Annaliz Aven ?10/23/2021  7:11 AM ? ?

## 2021-10-23 NOTE — ED Notes (Signed)
Patient resting in bed with sheet over face. Visitor at bedside. Received phone call, patient going to IR for thoracentesis ?

## 2021-10-23 NOTE — Plan of Care (Signed)

## 2021-10-23 NOTE — Consult Note (Addendum)
? ?                                                                          [facility] Consult: ?8:16 AM ?10/23/2021 ? LOS: 1 day  ? ? ?Referring Provider: Dr Alfredia Ferguson  ?Primary Care Physician:  Billie Ruddy, MD ?Primary Gastroenterologist:  Dr. Havery Moros. ? ? ? ?Reason for Consultation:  cirrhosis.  Pleural effusion.   ?  ?HPI: Katie Holland is a 56 y.o. female.  History decompensated alcoholic cirrhosis.  Esophageal variceal bleeding. ?EGD with banding "25 bands" in 2019.  Hep B immune (positive Hep Bs Ab in 04/2018). Umbilical hernia repair.   ?Most recent endoscopies: ?EGD 07/07/18 - 4 bands placed ?EGD 07/27/18 - 3 bands placed ?EGD 08/30/18 - 3 bands placed  ?EGD 11/08/18 -no varices amenable to banding, portal hypertensive gastritis ?EGD 01/31/19 - portal hypertensive gastritis, no varices ?EGD 08/01/2019.  Extensive scarring from prior banding at distal esophagus, no obvious varices.  Congested gastric antral mucosa consistent with portal hypertension.  No gastric varices.  Duodenal bulb to the second portion of duodenum normal. ?Colonoscopy 08/01/2019 with fair prep.  Though residual stool limited views of splenic flexure and proximal descending colon.  Nonbleeding, internal hemorrhoids.  Diffuse colonic congestion/edema throughout colon from portal hypertension. ?EGD 08/07/2020.  Scarring from prior banding at distal esophagus, antral gastritis with erosion but no bleeding.  Single duodenal polyp resected and retrieved.  Biopsies obtained for H. pylori.  Path: Polypoid duodenal mucosa with reactive changes, no dysplasia, no malignancy.  Chronic gastritis with atrophy as well as micronodular ECL-cell hyperplasia subejective of atrophic autoimmune gastritis.  No H. pylori ?EGD 08/22/2021.  Trace, distal esophageal varices without high risk stigmata.  Scarring from prior banding.  Large amount of food residue in  stomach, exam of stomach and duodenum not completed due to presence of food ? ?Innumerable paracentesis.  Latest 4/12, 8 L.  5/2, 9.8 L.  Fluid studies on both occasion negative for SBP. ? ?Ascites refractory to Lasix, Aldactone and requiring large-volume paracentesis every couple of weeks.  Ascites makes it difficult for her to lay down.  Compliant with Aldactone $RemoveBefore'200mg'[id number]$ , Lasix 80 mg daily.  No nonselective beta-blocker due to ascites.  TIPS discussed with patient.  During interval since last actual GI office visit, not including multiple phone calls, arrangements for TIPS were made and this was to be performed today 5/3. ? ?10/11/2021 abdominal pelvic ultrasound, Dopplers.  Patent portal and hepatic veins with antegrade flow anatomically approachable for TIPS.  Abdominal ascites, right pleural effusion.  Cholelithiasis. ? ?Yesterday was scheduled for and underwent 9.8 L paracentesis.  That day she had developed acute dyspnea without cough.  This was worked up and CT revealed large right pleural effusion.  She underwent 1.8 L thoracentesis according to discussion with IR, procedural note not yet in chart.  TIPS now postponed.  She denies bleeding per rectum, melenic stools.  BMs occur about 2 times a day, none of these are bloody or melenic.  Compliant with lactulose twice daily.  Has problems with edema when her legs are in a dependent position which resolves with elevation of her legs.  Having some right-sided abdominal pain.  Poor appetite.  No  nausea or vomiting. ? ?CT chest with large right pleural effusion w consolidation in middle and lower lobes.  5 mm left groundglass, upper lobe nodule residual abdominal ascites, changes of liver cirrhosis.  Small, uncomplicated gallstones. ?Na 142.  Potassium 3.3.  T. bili 2.3.  Alk phos 58.  AST/ALT 34/18 ?Hgb 11.2.  MCV 73.  Platelets 126 K.  INR 1.6. ?Troponins 194, 217, 234, 260 ?Lactic acid 2.3.  ?Iron 66, ferritin 22.  Low TIBC, elevated iron sats.  Folate nml.  B12  low 147.  ?UA with greater than 50 RBCs.  Blood culture PCR ID panel with detection of Enterobacter and E. coli. ?  ?Patient lives at home with her husband.  Unemployed.  Still drinking sporadically  Drink a beer about a week ago to "celebrate TIPS" getting scheduled.  Smokes 10 cigarettes daily. ?Denies family history of liver disease, colorectal or gastric cancers or disease. ? ? ?Past Medical History:  ?Diagnosis Date  ? Alcoholic liver disease ([place])   ? Chronic back pain   ? on disability  ? Cirrhosis of liver ([place])   ? Esophageal varices (HCC)   ? GERD (gastroesophageal reflux disease)   ? Headache   ? Jaundice   ? ? ?Past Surgical History:  ?Procedure Laterality Date  ? BACK SURGERY    ? L3-L4  ? BIOPSY  08/07/2020  ? Procedure: BIOPSY;  Surgeon: Yetta Flock, MD;  Location: [facility];  Service: Gastroenterology;;  ? COLONOSCOPY WITH PROPOFOL N/A 08/01/2019  ? Procedure: COLONOSCOPY WITH PROPOFOL;  Surgeon: Yetta Flock, MD;  Location: [facility];  Service: Gastroenterology;  Laterality: N/A;  ? ESOPHAGEAL BANDING  04/25/2018  ? Procedure: ESOPHAGEAL BANDING;  Surgeon: Yetta Flock, MD;  Location: [facility] ENDOSCOPY;  Service: Gastroenterology;;  ? ESOPHAGEAL BANDING  04/27/2018  ? Procedure: ESOPHAGEAL BANDING;  Surgeon: Milus Banister, MD;  Location: [facility] ENDOSCOPY;  Service: Endoscopy;;  ? ESOPHAGEAL BANDING  06/02/2018  ? Procedure: ESOPHAGEAL BANDING;  Surgeon: Doran Stabler, MD;  Location: [place];  Service: Gastroenterology;;  ? ESOPHAGEAL BANDING N/A 07/07/2018  ? Procedure: ESOPHAGEAL BANDING;  Surgeon: Yetta Flock, MD;  Location: [facility];  Service: Gastroenterology;  Laterality: N/A;  ? ESOPHAGEAL BANDING  07/27/2018  ? Procedure: ESOPHAGEAL BANDING;  Surgeon: Yetta Flock, MD;  Location: [facility];  Service: Gastroenterology;;  ? ESOPHAGEAL BANDING  08/30/2018  ? Procedure: ESOPHAGEAL BANDING;  Surgeon: Yetta Flock, MD;  Location: [facility]  ENDOSCOPY;  Service: Gastroenterology;;  ? ESOPHAGOGASTRODUODENOSCOPY N/A 04/27/2018  ? Procedure: ESOPHAGOGASTRODUODENOSCOPY (EGD);  Surgeon: Milus Banister, MD;  Location: [facility] ENDOSCOPY;  Service: Endoscopy;  Laterality: N/A;  ? ESOPHAGOGASTRODUODENOSCOPY (EGD) WITH PROPOFOL N/A 04/25/2018  ? Procedure: ESOPHAGOGASTRODUODENOSCOPY (EGD) WITH PROPOFOL;  Surgeon: Yetta Flock, MD;  Location: [place];  Service: Gastroenterology;  Laterality: N/A;  ? ESOPHAGOGASTRODUODENOSCOPY (EGD) WITH PROPOFOL N/A 06/02/2018  ? Procedure: ESOPHAGOGASTRODUODENOSCOPY (EGD) WITH PROPOFOL;  Surgeon: Doran Stabler, MD;  Location: [place];  Service: Gastroenterology;  Laterality: N/A;  ? ESOPHAGOGASTRODUODENOSCOPY (EGD) WITH PROPOFOL N/A 07/07/2018  ? Procedure: ESOPHAGOGASTRODUODENOSCOPY (EGD) WITH PROPOFOL;  Surgeon: Yetta Flock, MD;  Location: [facility]OS[facility];  Service: Gastroenterology;  Laterality: N/A;  ? ESOPHAGOGASTRODUODENOSCOPY (EGD) WITH PROPOFOL N/A 07/27/2018  ? Procedure: ESOPHAGOGASTRODUODENOSCOPY (EGD) WITH PROPOFOL;  Surgeon: Yetta Flock, MD;  Location: [facility]OS[facility];  Service: Gastroenterology;  Laterality: N/A;  ? ESOPHAGOGASTRODUODENOSCOPY (EGD) WITH PROPOFOL N/A 08/30/2018  ? Procedure: ESOPHAGOGASTRODUODENOSCOPY (EGD) WITH PROPOFOL;  Surgeon: Yetta Flock,  MD;  Location: [facility];  Service: Gastroenterology;  Laterality: N/A;  ? ESOPHAGOGASTRODUODENOSCOPY (EGD) WITH PROPOFOL N/A 11/08/2018  ? Procedure: ESOPHAGOGASTRODUODENOSCOPY (EGD) WITH PROPOFOL;  Surgeon: Yetta Flock, MD;  Location: [facility];  Service: Gastroenterology;  Laterality: N/A;  ? ESOPHAGOGASTRODUODENOSCOPY (EGD) WITH PROPOFOL N/A 01/31/2019  ? Procedure: ESOPHAGOGASTRODUODENOSCOPY (EGD) WITH PROPOFOL;  Surgeon: Yetta Flock, MD;  Location: [facility];  Service: Gastroenterology;  Laterality: N/A;  ? ESOPHAGOGASTRODUODENOSCOPY (EGD) WITH PROPOFOL N/A 08/01/2019  ? Procedure:  ESOPHAGOGASTRODUODENOSCOPY (EGD) WITH PROPOFOL;  Surgeon: Yetta Flock, MD;  Location: [facility];  Service: Gastroenterology;  Laterality: N/A;  ? ESOPHAGOGASTRODUODENOSCOPY (EGD) WITH PROPOFOL N/A 08/07/2020  ? Procedur

## 2021-10-23 NOTE — ED Notes (Signed)
This RN inquired pharmacy about the patient's 1230pm medication that is overdue ?

## 2021-10-23 NOTE — ED Notes (Signed)
Patient going to MRI

## 2021-10-23 NOTE — ED Notes (Signed)
Troponin resulted. Timed troponin will be taken at 0214 ?

## 2021-10-23 NOTE — Progress Notes (Signed)
? ?Chief Complaint: ?Patient was seen in consultation today for TIPS ? ? ?Supervising Physician: Arne Cleveland ? ?Patient Status: [facility] - In-pt ? ?History of Present Illness: ?Katie Holland is a 56 y.o. female well known to IR team. Hx of frequent paracentesis secondary to underlying cirrhosis and refractory ascites. Remote history of portal vein thrombosis and cavernous transformation. She has had multiple endoscopies and banding of large bleeding esophageal varices.  We had initially consulted with her back in 2020 for TIPS creation, but this was delayed due to the pandemic and subsequently due to hepatic decompensation with an elevated MELD of 21.  She had started a liver transplant evaluation with [facility] at that time.  Now, she is clinically doing much better except for the recurrent ascites.  ?Had been recently seen in consultation and was scheduled for TIPS procedure but presented to ER uesterday with worsening SOB and abd distention. Underwent paracentesis yesterday and right thoracentesis today. ?Awaiting admission. ?Also noted + blood cultures with E.Coli and Enterobacter. ? ?Past Medical History:  ?Diagnosis Date  ? Alcoholic liver disease ([place])   ? Chronic back pain   ? on disability  ? Cirrhosis of liver ([place])   ? Esophageal varices (HCC)   ? GERD (gastroesophageal reflux disease)   ? Headache   ? Jaundice   ? ? ?Past Surgical History:  ?Procedure Laterality Date  ? BACK SURGERY    ? L3-L4  ? BIOPSY  08/07/2020  ? Procedure: BIOPSY;  Surgeon: Yetta Flock, MD;  Location: [facility];  Service: Gastroenterology;;  ? COLONOSCOPY WITH PROPOFOL N/A 08/01/2019  ? Procedure: COLONOSCOPY WITH PROPOFOL;  Surgeon: Yetta Flock, MD;  Location: [facility];  Service: Gastroenterology;  Laterality: N/A;  ? ESOPHAGEAL BANDING  04/25/2018  ? Procedure: ESOPHAGEAL BANDING;  Surgeon: Yetta Flock, MD;  Location: [facility] ENDOSCOPY;  Service: Gastroenterology;;  ? ESOPHAGEAL BANDING  04/27/2018  ?  Procedure: ESOPHAGEAL BANDING;  Surgeon: Milus Banister, MD;  Location: [facility] ENDOSCOPY;  Service: Endoscopy;;  ? ESOPHAGEAL BANDING  06/02/2018  ? Procedure: ESOPHAGEAL BANDING;  Surgeon: Doran Stabler, MD;  Location: [place];  Service: Gastroenterology;;  ? ESOPHAGEAL BANDING N/A 07/07/2018  ? Procedure: ESOPHAGEAL BANDING;  Surgeon: Yetta Flock, MD;  Location: [facility];  Service: Gastroenterology;  Laterality: N/A;  ? ESOPHAGEAL BANDING  07/27/2018  ? Procedure: ESOPHAGEAL BANDING;  Surgeon: Yetta Flock, MD;  Location: [facility];  Service: Gastroenterology;;  ? ESOPHAGEAL BANDING  08/30/2018  ? Procedure: ESOPHAGEAL BANDING;  Surgeon: Yetta Flock, MD;  Location: [facility];  Service: Gastroenterology;;  ? ESOPHAGOGASTRODUODENOSCOPY N/A 04/27/2018  ? Procedure: ESOPHAGOGASTRODUODENOSCOPY (EGD);  Surgeon: Milus Banister, MD;  Location: [facility] ENDOSCOPY;  Service: Endoscopy;  Laterality: N/A;  ? ESOPHAGOGASTRODUODENOSCOPY (EGD) WITH PROPOFOL N/A 04/25/2018  ? Procedure: ESOPHAGOGASTRODUODENOSCOPY (EGD) WITH PROPOFOL;  Surgeon: Yetta Flock, MD;  Location: [place];  Service: Gastroenterology;  Laterality: N/A;  ? ESOPHAGOGASTRODUODENOSCOPY (EGD) WITH PROPOFOL N/A 06/02/2018  ? Procedure: ESOPHAGOGASTRODUODENOSCOPY (EGD) WITH PROPOFOL;  Surgeon: Doran Stabler, MD;  Location: [place];  Service: Gastroenterology;  Laterality: N/A;  ? ESOPHAGOGASTRODUODENOSCOPY (EGD) WITH PROPOFOL N/A 07/07/2018  ? Procedure: ESOPHAGOGASTRODUODENOSCOPY (EGD) WITH PROPOFOL;  Surgeon: Yetta Flock, MD;  Location: [facility];  Service: Gastroenterology;  Laterality: N/A;  ? ESOPHAGOGASTRODUODENOSCOPY (EGD) WITH PROPOFOL N/A 07/27/2018  ? Procedure: ESOPHAGOGASTRODUODENOSCOPY (EGD) WITH PROPOFOL;  Surgeon: Yetta Flock, MD;  Location: [facility];  Service: Gastroenterology;  Laterality: N/A;  ? ESOPHAGOGASTRODUODENOSCOPY (EGD) WITH  PROPOFOL N/A 08/30/2018  ? Procedure:  ESOPHAGOGASTRODUODENOSCOPY (EGD) WITH PROPOFOL;  Surgeon: Yetta Flock, MD;  Location: [facility];  Service: Gastroenterology;  Laterality: N/A;  ? ESOPHAGOGASTRODUODENOSCOPY (EGD) WITH PROPOFOL N/A 11/08/2018  ? Procedure: ESOPHAGOGASTRODUODENOSCOPY (EGD) WITH PROPOFOL;  Surgeon: Yetta Flock, MD;  Location: [facility];  Service: Gastroenterology;  Laterality: N/A;  ? ESOPHAGOGASTRODUODENOSCOPY (EGD) WITH PROPOFOL N/A 01/31/2019  ? Procedure: ESOPHAGOGASTRODUODENOSCOPY (EGD) WITH PROPOFOL;  Surgeon: Yetta Flock, MD;  Location: [facility];  Service: Gastroenterology;  Laterality: N/A;  ? ESOPHAGOGASTRODUODENOSCOPY (EGD) WITH PROPOFOL N/A 08/01/2019  ? Procedure: ESOPHAGOGASTRODUODENOSCOPY (EGD) WITH PROPOFOL;  Surgeon: Yetta Flock, MD;  Location: [facility];  Service: Gastroenterology;  Laterality: N/A;  ? ESOPHAGOGASTRODUODENOSCOPY (EGD) WITH PROPOFOL N/A 08/07/2020  ? Procedure: ESOPHAGOGASTRODUODENOSCOPY (EGD) WITH PROPOFOL;  Surgeon: Yetta Flock, MD;  Location: [facility];  Service: Gastroenterology;  Laterality: N/A;  ? ESOPHAGOGASTRODUODENOSCOPY (EGD) WITH PROPOFOL N/A 08/22/2021  ? Procedure: ESOPHAGOGASTRODUODENOSCOPY (EGD) WITH PROPOFOL;  Surgeon: Yetta Flock, MD;  Location: [facility];  Service: Gastroenterology;  Laterality: N/A;  ? IR PARACENTESIS  04/26/2018  ? IR PARACENTESIS  05/18/2018  ? IR PARACENTESIS  05/31/2018  ? IR PARACENTESIS  06/30/2018  ? IR PARACENTESIS  08/05/2018  ? IR PARACENTESIS  08/31/2018  ? IR PARACENTESIS  10/08/2018  ? IR PARACENTESIS  10/28/2018  ? IR PARACENTESIS  02/14/2019  ? IR PARACENTESIS  03/09/2019  ? IR PARACENTESIS  04/05/2019  ? IR PARACENTESIS  05/03/2019  ? IR PARACENTESIS  06/20/2019  ? IR PARACENTESIS  07/19/2019  ? IR PARACENTESIS  08/19/2019  ? IR PARACENTESIS  09/27/2019  ? IR PARACENTESIS  10/07/2019  ? IR PARACENTESIS  10/28/2019  ? IR PARACENTESIS  11/16/2019  ? IR PARACENTESIS  12/30/2019  ? IR PARACENTESIS  01/16/2020  ?  IR PARACENTESIS  02/07/2020  ? IR PARACENTESIS  03/06/2020  ? IR PARACENTESIS  04/04/2020  ? IR PARACENTESIS  04/17/2020  ? IR PARACENTESIS  05/04/2020  ? IR PARACENTESIS  06/07/2020  ? IR PARACENTESIS  06/28/2020  ? IR PARACENTESIS  07/27/2020  ? IR PARACENTESIS  08/20/2020  ? IR PARACENTESIS  09/13/2020  ? IR PARACENTESIS  10/04/2020  ? IR PARACENTESIS  10/25/2020  ? IR PARACENTESIS  11/07/2020  ? IR PARACENTESIS  11/30/2020  ? IR PARACENTESIS  12/13/2020  ? IR PARACENTESIS  01/04/2021  ? IR PARACENTESIS  01/25/2021  ? IR PARACENTESIS  02/20/2021  ? IR PARACENTESIS  03/20/2021  ? IR PARACENTESIS  04/18/2021  ? IR PARACENTESIS  05/14/2021  ? IR PARACENTESIS  06/10/2021  ? IR PARACENTESIS  06/27/2021  ? IR PARACENTESIS  07/17/2021  ? IR PARACENTESIS  08/12/2021  ? IR PARACENTESIS  08/23/2021  ? IR PARACENTESIS  09/13/2021  ? IR PARACENTESIS  10/02/2021  ? IR PARACENTESIS  10/22/2021  ? IR RADIOLOGIST EVAL & MGMT  12/23/2018  ? IR RADIOLOGIST EVAL & MGMT  10/11/2021  ? IR THORACENTESIS ASP PLEURAL SPACE W/IMG GUIDE  12/10/2018  ? IR THORACENTESIS ASP PLEURAL SPACE W/IMG GUIDE  01/21/2019  ? IR THORACENTESIS ASP PLEURAL SPACE W/IMG GUIDE  10/23/2021  ? ? ?Allergies: ?Contrast media [iodinated contrast media], Latex, and Sulfa antibiotics ? ?Medications: ?Prior to Admission medications   ?Medication Sig Start Date End Date Taking? Authorizing Provider  ?furosemide (LASIX) 20 MG tablet Take 2 tablets (40 mg total) by mouth 2 (two) times daily. 09/11/21 12/10/21 Yes Armbruster, Carlota Raspberry, MD  ?Multiple Vitamin (MULTIVITAMIN WITH MINERALS) TABS tablet Take 1 tablet  by mouth daily.   Yes [provider]  ?prednisoLONE acetate (PRED FORTE) 1 % ophthalmic suspension Place 1 drop into both eyes 2 (two) times daily.   Yes [provider]  ?spironolactone (ALDACTONE) 100 MG tablet Take 2 tablets (200 mg total) by mouth once daily 09/11/21  Yes Armbruster, Carlota Raspberry, MD  ?  ? ?Family History  ?Problem Relation Age of Onset  ? Colon cancer Neg Hx   ?  Esophageal cancer Neg Hx   ? Pancreatic cancer Neg Hx   ? Stomach cancer Neg Hx   ? ? ?Social History  ? ?Socioeconomic History  ? Marital status: Single  ?  Spouse name: Not on file  ? Number of children: 8

## 2021-10-23 NOTE — Progress Notes (Signed)
Abdominal film completed at bedside ?

## 2021-10-24 ENCOUNTER — Inpatient Hospital Stay (HOSPITAL_COMMUNITY): Payer: Medicare HMO

## 2021-10-24 DIAGNOSIS — J189 Pneumonia, unspecified organism: Secondary | ICD-10-CM | POA: Diagnosis not present

## 2021-10-24 DIAGNOSIS — R7989 Other specified abnormal findings of blood chemistry: Secondary | ICD-10-CM | POA: Diagnosis not present

## 2021-10-24 DIAGNOSIS — K7031 Alcoholic cirrhosis of liver with ascites: Secondary | ICD-10-CM | POA: Diagnosis not present

## 2021-10-24 DIAGNOSIS — J90 Pleural effusion, not elsewhere classified: Secondary | ICD-10-CM | POA: Diagnosis not present

## 2021-10-24 DIAGNOSIS — J9601 Acute respiratory failure with hypoxia: Secondary | ICD-10-CM | POA: Diagnosis not present

## 2021-10-24 LAB — MAGNESIUM: Magnesium: 1.8 mg/dL (ref 1.7–2.4)

## 2021-10-24 LAB — COMPREHENSIVE METABOLIC PANEL
ALT: 17 U/L (ref 0–44)
ALT: 16 U/L (ref 0–44)
AST: 27 U/L (ref 15–41)
AST: 29 U/L (ref 15–41)
Albumin: 2.5 g/dL — ABNORMAL LOW (ref 3.5–5.0)
Albumin: 2.6 g/dL — ABNORMAL LOW (ref 3.5–5.0)
Alkaline Phosphatase: 76 U/L (ref 38–126)
Alkaline Phosphatase: 86 U/L (ref 38–126)
Anion gap: 6 (ref 5–15)
Anion gap: 7 (ref 5–15)
BUN: 8 mg/dL (ref 6–20)
BUN: 9 mg/dL (ref 6–20)
CO2: 25 mmol/L (ref 22–32)
CO2: 24 mmol/L (ref 22–32)
Calcium: 8.6 mg/dL — ABNORMAL LOW (ref 8.9–10.3)
Calcium: 8.8 mg/dL — ABNORMAL LOW (ref 8.9–10.3)
Chloride: 109 mmol/L (ref 98–111)
Chloride: 110 mmol/L (ref 98–111)
Creatinine, Ser: 0.77 mg/dL (ref 0.44–1.00)
Creatinine, Ser: 0.71 mg/dL (ref 0.44–1.00)
GFR, Estimated: >60 mL/min (ref >60)
GFR, Estimated: >60 mL/min (ref >60)
Glucose, Bld: 112 mg/dL — ABNORMAL HIGH (ref 70–99)
Glucose, Bld: 113 mg/dL — ABNORMAL HIGH (ref 70–99)
Potassium: 3.3 mmol/L — ABNORMAL LOW (ref 3.5–5.1)
Potassium: 3.6 mmol/L (ref 3.5–5.1)
Sodium: 140 mmol/L (ref 135–145)
Sodium: 141 mmol/L (ref 135–145)
Total Bilirubin: 1.2 mg/dL (ref 0.3–1.2)
Total Bilirubin: 1.2 mg/dL (ref 0.3–1.2)
Total Protein: 5.3 g/dL — ABNORMAL LOW (ref 6.5–8.1)
Total Protein: 5.4 g/dL — ABNORMAL LOW (ref 6.5–8.1)

## 2021-10-24 LAB — CBC WITH DIFFERENTIAL/PLATELET
Abs Immature Granulocytes: 0.01 10*3/uL (ref 0.00–0.07)
Basophils Absolute: 0 10*3/uL (ref 0.0–0.1)
Basophils Relative: 0 %
Eosinophils Absolute: 0.3 10*3/uL (ref 0.0–0.5)
Eosinophils Relative: 5 %
HCT: 29.7 % — ABNORMAL LOW (ref 36.0–46.0)
Hemoglobin: 9.6 g/dL — ABNORMAL LOW (ref 12.0–15.0)
Immature Granulocytes: 0 %
Lymphocytes Relative: 21 %
Lymphs Abs: 1.4 10*3/uL (ref 0.7–4.0)
MCH: 23.4 pg — ABNORMAL LOW (ref 26.0–34.0)
MCHC: 32.3 g/dL (ref 30.0–36.0)
MCV: 72.3 fL — ABNORMAL LOW (ref 80.0–100.0)
Monocytes Absolute: 0.5 10*3/uL (ref 0.1–1.0)
Monocytes Relative: 7 %
Neutro Abs: 4.5 10*3/uL (ref 1.7–7.7)
Neutrophils Relative %: 67 %
Platelets: 136 10*3/uL — ABNORMAL LOW (ref 150–400)
RBC: 4.11 MIL/uL (ref 3.87–5.11)
RDW: 23.9 % — ABNORMAL HIGH (ref 11.5–15.5)
WBC: 6.8 10*3/uL (ref 4.0–10.5)
nRBC: 0 % (ref 0.0–0.2)

## 2021-10-24 LAB — LEGIONELLA PNEUMOPHILA SEROGP 1 UR AG: L. pneumophila Serogp 1 Ur Ag: NEGATIVE

## 2021-10-24 LAB — GLUCOSE, CAPILLARY
Glucose-Capillary: 125 mg/dL — ABNORMAL HIGH (ref 70–99)
Glucose-Capillary: 112 mg/dL — ABNORMAL HIGH (ref 70–99)
Glucose-Capillary: 77 mg/dL (ref 70–99)

## 2021-10-24 LAB — CBC
HCT: 32.5 % — ABNORMAL LOW (ref 36.0–46.0)
Hemoglobin: 10.1 g/dL — ABNORMAL LOW (ref 12.0–15.0)
MCH: 23 pg — ABNORMAL LOW (ref 26.0–34.0)
MCHC: 31.1 g/dL (ref 30.0–36.0)
MCV: 73.9 fL — ABNORMAL LOW (ref 80.0–100.0)
Platelets: 126 10*3/uL — ABNORMAL LOW (ref 150–400)
RBC: 4.4 MIL/uL (ref 3.87–5.11)
RDW: 24 % — ABNORMAL HIGH (ref 11.5–15.5)
WBC: 5.8 10*3/uL (ref 4.0–10.5)
nRBC: 0 % (ref 0.0–0.2)

## 2021-10-24 LAB — T3: T3, Total: 39 ng/dL — ABNORMAL LOW (ref 71–180)

## 2021-10-24 LAB — PROTIME-INR
INR: 1.4 — ABNORMAL HIGH (ref 0.8–1.2)
Prothrombin Time: 17.3 seconds — ABNORMAL HIGH (ref 11.4–15.2)

## 2021-10-24 LAB — VITAMIN D 25 HYDROXY (VIT D DEFICIENCY, FRACTURES): Vit D, 25-Hydroxy: 24.54 ng/mL — ABNORMAL LOW (ref 30–100)

## 2021-10-24 LAB — CYTOLOGY - NON PAP

## 2021-10-24 LAB — PHOSPHORUS: Phosphorus: 2.2 mg/dL — ABNORMAL LOW (ref 2.5–4.6)

## 2021-10-24 MED ORDER — GUAIFENESIN ER 600 MG PO TB12
1200.0000 mg | ORAL_TABLET | Freq: Two times a day (BID) | ORAL | Status: DC
  Administered 2021-10-24 – 2021-11-08 (×28): 1200 mg via ORAL
  Filled 2021-10-24 (×29): qty 2

## 2021-10-24 MED ORDER — TRAMADOL HCL 50 MG PO TABS
50.0000 mg | ORAL_TABLET | Freq: Four times a day (QID) | ORAL | Status: DC | PRN
  Administered 2021-10-25 – 2021-11-07 (×16): 50 mg via ORAL
  Filled 2021-10-24 (×16): qty 1

## 2021-10-24 MED ORDER — SODIUM CHLORIDE 0.9% FLUSH
3.0000 mL | Freq: Two times a day (BID) | INTRAVENOUS | Status: DC
  Administered 2021-10-24 – 2021-11-08 (×30): 3 mL via INTRAVENOUS

## 2021-10-24 MED ORDER — LEVALBUTEROL HCL 0.63 MG/3ML IN NEBU
0.6300 mg | INHALATION_SOLUTION | Freq: Four times a day (QID) | RESPIRATORY_TRACT | Status: DC | PRN
  Filled 2021-10-24: qty 3

## 2021-10-24 MED ORDER — BOOST PLUS PO LIQD
237.0000 mL | Freq: Two times a day (BID) | ORAL | Status: DC
  Administered 2021-10-24 – 2021-11-08 (×20): 237 mL via ORAL
  Filled 2021-10-24 (×31): qty 237

## 2021-10-24 MED ORDER — PNEUMOCOCCAL 20-VAL CONJ VACC 0.5 ML IM SUSY
0.5000 mL | PREFILLED_SYRINGE | INTRAMUSCULAR | Status: AC
Start: 2021-10-25 — End: 2021-10-26
  Administered 2021-10-26: 0.5 mL via INTRAMUSCULAR
  Filled 2021-10-24: qty 0.5

## 2021-10-24 MED ORDER — SODIUM CHLORIDE 0.9 % IV SOLN: 250.0000 mL | INTRAVENOUS | Status: DC | PRN

## 2021-10-24 MED ORDER — LACTULOSE 10 GM/15ML PO SOLN
10.0000 g | Freq: Two times a day (BID) | ORAL | Status: DC
  Administered 2021-10-24 – 2021-10-25 (×2): 10 g via ORAL
  Filled 2021-10-24 (×2): qty 15

## 2021-10-24 MED ORDER — SODIUM CHLORIDE 0.9% FLUSH: 3.0000 mL | INTRAVENOUS | Status: DC | PRN

## 2021-10-24 MED ORDER — SPIRONOLACTONE 25 MG PO TABS
200.0000 mg | ORAL_TABLET | Freq: Once | ORAL | Status: AC
  Administered 2021-10-24: 200 mg via ORAL
  Filled 2021-10-24: qty 8

## 2021-10-24 MED ORDER — FUROSEMIDE 40 MG PO TABS
40.0000 mg | ORAL_TABLET | Freq: Two times a day (BID) | ORAL | Status: DC
  Administered 2021-10-24 – 2021-10-25 (×2): 40 mg via ORAL
  Filled 2021-10-24 (×2): qty 1

## 2021-10-24 MED ORDER — IPRATROPIUM BROMIDE 0.02 % IN SOLN
0.5000 mg | Freq: Four times a day (QID) | RESPIRATORY_TRACT | Status: DC | PRN
Start: 2021-10-24 — End: 2021-11-08

## 2021-10-24 MED ORDER — FENTANYL CITRATE PF 50 MCG/ML IJ SOSY
12.5000 ug | PREFILLED_SYRINGE | INTRAMUSCULAR | Status: DC | PRN
  Administered 2021-10-25: 12.5 ug via INTRAVENOUS
  Administered 2021-10-27 – 2021-11-07 (×14): 50 ug via INTRAVENOUS
  Filled 2021-10-24 (×16): qty 1

## 2021-10-24 MED ORDER — ADULT MULTIVITAMIN W/MINERALS CH
1.0000 | ORAL_TABLET | Freq: Every day | ORAL | Status: DC
Start: 2021-10-24 — End: 2021-11-08
  Administered 2021-10-24 – 2021-11-08 (×15): 1 via ORAL
  Filled 2021-10-24 (×15): qty 1

## 2021-10-24 NOTE — Progress Notes (Signed)
Initial Nutrition Assessment ? ?DOCUMENTATION CODES:  ? ?Severe malnutrition in context of chronic illness ? ?INTERVENTION:  ? ?Multivitamin w/ minerals daily ?Boost Plus po BID, provides 360 kcal and 14 grams of protein each. ?Meal ordering with assist ?Recommend obtaining new weight. RN notified.  ?Check Zinc, Vitamin D, B1, and B6 due to increase risk of deficiency related to cirrhosis. ? ?NUTRITION DIAGNOSIS:  ? ?Severe Malnutrition related to chronic illness (cirrhosis) as evidenced by severe muscle depletion, severe fat depletion. ? ?GOAL:  ? ?Patient will meet greater than or equal to 90% of their needs ? ?MONITOR:  ? ?Labs, PO intake, Supplement acceptance, Weight trends ? ?REASON FOR ASSESSMENT:  ? ?Consult ?Assessment of nutrition requirement/status ? ?ASSESSMENT:  ? ?56 y.o. female presented to the ED with shortness of breath and increase oxygen requirement. PMH includes EtOH abuse, cirrhosis, malnutrition, and esophageal varices. Pt admitted with sepsis.  ? ?5/02 - Paracentesis (9.8 L removed) ?5/03 - Thoracentesis (1.9 L removed) ? ?Pt reports that her appetite has been poor for a little bit now. States that she typically drinks Boost, ham sandwich, and apple juice. Pt states that she snacks on foods throughout the day. Reports that she often feels full quick. Denies any nausea or vomiting. Pt reports that she has not been drinking alcohol at home; per MD note, pt last drink was 3 months ago.  ? ?Pt unable to provide her UBW, but states that she has had an 80# weight loss within the past 8 months. Pt with no new weight this admission, unable to assess pt weight loss.  ? ?Discussed that we can add ONS here; pt agreeable, asking for Boost over Ensure.  ?Recommend to pt that it may be easier  to eat smaller,frequent meals at home due to early satiety as a result of ongoing ascites. Pt expressed good understanding.  ? ?Medications reviewed and include: Lasix, Lactulose, Spironolactone, Vitamin B12, IV  antibiotics  ?Labs reviewed: Phosphorus 2.2, Vitamin B12 147  ? ?NUTRITION - FOCUSED PHYSICAL EXAM: ? ?Flowsheet Row Most Recent Value  ?Orbital Region Severe depletion  ?Upper Arm Region Severe depletion  ?Thoracic and Lumbar Region Severe depletion  ?Buccal Region Severe depletion  ?Temple Region Severe depletion  ?Clavicle Bone Region Severe depletion  ?Clavicle and Acromion Bone Region Severe depletion  ?Scapular Bone Region Severe depletion  ?Dorsal Hand Severe depletion  ?Patellar Region Severe depletion  ?Anterior Thigh Region Severe depletion  ?Posterior Calf Region Severe depletion  ?Edema (RD Assessment) None  ?Hair Unable to assess  ?Eyes Reviewed  ?Mouth Reviewed  ?Skin Reviewed  ?Nails Reviewed  ? ?Diet Order:   ?Diet Order   ? ?       ?  Diet 2 gram sodium Room service appropriate? Yes; Fluid consistency: Thin  Diet effective now       ?  ? ?  ?  ? ?  ? ? ?EDUCATION NEEDS:  ? ?No education needs have been identified at this time ? ?Skin:  Skin Assessment: Reviewed RN Assessment ? ?Last BM:  5/2 ? ?Height:  ? ?Ht Readings from Last 1 Encounters:  ?08/22/21 '5\' 4"'$  (1.626 m)  ? ? ?Weight:  ? ?Wt Readings from Last 1 Encounters:  ?08/22/21 69.8 kg  ? ? ?Ideal Body Weight:  54.6 kg ? ?BMI:  There is no height or weight on file to calculate BMI. ? ?Estimated Nutritional Needs:  ? ?Kcal:  1800-2000 ? ?Protein:  90-105 grams ? ?Fluid:  >/= 1.8 L ? ? ? ?  Hermina Barters RD, LDN ?Clinical Dietitian ?See [facility] for contact information.  ? ?

## 2021-10-24 NOTE — Evaluation (Signed)
Physical Therapy Evaluation ?Patient Details ?Name: Katie Holland ?MRN: [id number] ?DOB: 02/21/1966 ?Today's Date: 10/24/2021 ? ?History of Present Illness ? 56 y/o female presented to ED on 10/22/21 for SOB and chest tightness after she received paracentesis earlier that day. Admitted for sepsis 2/2 E. coli bacteremia and CAP. PMH: ETOH cirrhosis  ?Clinical Impression ? Patient admitted with the above findings. Patient presents with generalized weakness, impaired balance, decreased activity tolerance, and pain. Patient currently ambulating at min guard level with RW. Patient with general unsteadiness, but patient denies SOB with mobility compared to when admitted. Patient states she lives alone but does have a husband that can assist. Patient will benefit from skilled PT services during acute stay to address listed deficits. Recommend HHPT at discharge to maximize functional independence and reduce fall risk.    ?   ? ?Recommendations for follow up therapy are one component of a multi-disciplinary discharge planning process, led by the attending physician.  Recommendations may be updated based on patient status, additional functional criteria and insurance authorization. ? ?Follow Up Recommendations Home health PT ? ?  ?Assistance Recommended at Discharge Intermittent Supervision/Assistance  ?Patient can return home with the following ?   ? ?  ?Equipment Recommendations None recommended by PT  ?Recommendations for Other Services ?    ?  ?Functional Status Assessment Patient has had a recent decline in their functional status and demonstrates the ability to make significant improvements in function in a reasonable and predictable amount of time.  ? ?  ?Precautions / Restrictions Precautions ?Precautions: Fall ?Restrictions ?Weight Bearing Restrictions: No  ? ?  ? ?Mobility ? Bed Mobility ?Overal bed mobility: Modified Independent ?  ?  ?  ?  ?  ?  ?  ?  ? ?Transfers ?Overall transfer level: Needs assistance ?Equipment used:  Rolling Daiveon Markman (2 wheels) ?Transfers: Sit to/from Stand ?Sit to Stand: Min guard ?  ?  ?  ?  ?  ?General transfer comment: min guard for safety. Cues for hand placement ?  ? ?Ambulation/Gait ?Ambulation/Gait assistance: Min guard ?Gait Distance (Feet): 120 Feet ?Assistive device: Rolling Benjamine Strout (2 wheels) ?Gait Pattern/deviations: Step-through pattern, Decreased stride length ?Gait velocity: decreased ?  ?  ?General Gait Details: min guard for safety and balance. Patient unsteady and drifting L/R during mobility ? ?Stairs ?  ?  ?  ?  ?  ? ?Wheelchair Mobility ?  ? ?Modified Rankin (Stroke Patients Only) ?  ? ?  ? ?Balance Overall balance assessment: Needs assistance ?Sitting-balance support: No upper extremity supported, Feet supported ?Sitting balance-Leahy Scale: Good ?  ?  ?Standing balance support: Bilateral upper extremity supported, Reliant on assistive device for balance ?Standing balance-Leahy Scale: Poor ?Standing balance comment: reliant on RW for support ?  ?  ?  ?  ?  ?  ?  ?  ?  ?  ?  ?   ? ? ? ?Pertinent Vitals/Pain Pain Assessment ?Pain Assessment: Faces ?Faces Pain Scale: Hurts even more ?Pain Location: abdomen ?Pain Descriptors / Indicators: Grimacing, Guarding ?Pain Intervention(s): Monitored during session  ? ? ?Home Living Family/patient expects to be discharged to:: Private residence ?Living Arrangements: Alone ?Available Help at Discharge: Family ?Type of Home: Apartment ?Home Access: Level entry ?  ?  ?  ?Home Layout: One level ?Home Equipment: Conservation officer, nature (2 wheels);Cane - single point ?   ?  ?Prior Function Prior Level of Function : Independent/Modified Independent ?  ?  ?  ?  ?  ?  ?Mobility  Comments: uses cane for short distance and RW for long distances ?  ?  ? ? ?Hand Dominance  ?   ? ?  ?Extremity/Trunk Assessment  ? Upper Extremity Assessment ?Upper Extremity Assessment: Generalized weakness ?  ? ?Lower Extremity Assessment ?Lower Extremity Assessment: Generalized weakness ?  ? ?    ?Communication  ? Communication: No difficulties  ?Cognition Arousal/Alertness: Awake/alert ?Behavior During Therapy: [facility] for tasks assessed/performed ?Overall Cognitive Status: Within Functional Limits for tasks assessed ?  ?  ?  ?  ?  ?  ?  ?  ?  ?  ?  ?  ?  ?  ?  ?  ?  ?  ?  ? ?  ?General Comments General comments (skin integrity, edema, etc.): patient with concerns of bulging in umbilical region. VSS on RA ? ?  ?Exercises    ? ?Assessment/Plan  ?  ?PT Assessment Patient needs continued PT services  ?PT Problem List Decreased strength;Decreased activity tolerance;Decreased balance;Decreased mobility ? ?   ?  ?PT Treatment Interventions DME instruction;Gait training;Functional mobility training;Therapeutic activities;Therapeutic exercise;Balance training;Patient/family education   ? ?PT Goals (Current goals can be found in the Care Plan section)  ?Acute Rehab PT Goals ?Patient Stated Goal: to reduce abdominal swelling ?PT Goal Formulation: With patient ?Time For Goal Achievement: 11/07/21 ?Potential to Achieve Goals: Good ? ?  ?Frequency Min 3X/week ?  ? ? ?Co-evaluation   ?  ?  ?  ?  ? ? ?  ?AM-PAC PT "6 Clicks" Mobility  ?Outcome Measure Help needed turning from your back to your side while in a flat bed without using bedrails?: None ?Help needed moving from lying on your back to sitting on the side of a flat bed without using bedrails?: None ?Help needed moving to and from a bed to a chair (including a wheelchair)?: A Little ?Help needed standing up from a chair using your arms (e.g., wheelchair or bedside chair)?: A Little ?Help needed to walk in hospital room?: A Little ?Help needed climbing 3-5 steps with a railing? : A Little ?6 Click Score: 20 ? ?  ?End of Session   ?Activity Tolerance: Patient tolerated treatment well ?Patient left: in bed;with call bell/phone within reach ?Nurse Communication: Mobility status ?PT Visit Diagnosis: Muscle weakness (generalized) (M62.81);Unsteadiness on feet (R26.81) ?   ? ?Time: 1224-8250 ?PT Time Calculation (min) (ACUTE ONLY): 22 min ? ? ?Charges:   PT Evaluation ?$PT Eval Low Complexity: 1 Low ?  ?  ?   ? ? ?Paula Busenbark A. Gilford Rile, PT, DPT ?Acute Rehabilitation Services ?Pager [phone] ?Office [phone] ? ? ?Benna Arno A Dwan Fennel ?10/24/2021, 2:22 PM ? ?

## 2021-10-24 NOTE — Progress Notes (Signed)
? ? Progress Note ? ? Subjective  ?Chief Complaint: Cirrhosis with pleural effusion ? ?This morning the patient is found up and walking around her room, she tells me that she continues with a generalized abdominal discomfort but that she can breathe better.  She is tolerating her current diet and denies any new complaints or concerns. ? ? Objective  ? ?Vital signs in last 24 hours: ?Temp:  [97.7 ?F (36.5 ?C)-98.2 ?F (36.8 ?C)] 98 ?F (36.7 ?C) (05/04 1209) ?Pulse Rate:  [83-109] 83 (05/04 1209) ?Resp:  [16-28] 16 (05/04 1209) ?BP: (105-142)/(67-95) 123/78 (05/04 1209) ?SpO2:  [92 %-100 %] 97 % (05/04 1209) ?Last BM Date : 10/22/21 ?General:    Chronically ill AA female in NAD ?Heart:  Regular rate and rhythm; no murmurs ?Lungs: Respirations even and unlabored, lungs CTA bilaterally ?Abdomen:  Soft, mild generalized TTP and mild distention. Normal bowel sounds. ?Psych:  Cooperative. Normal mood and affect. ? ?Intake/Output from previous day: ?05/03 0701 - 05/04 0700 ?In: 240 [P.O.:240] ?Out: -  ?Intake/Output this shift: ?Total I/O ?In: 349.8 [I.V.:3; IV Piggyback:346.8] ?Out: 400 [Urine:400] ? ?Lab Results: ?Recent Labs  ?  10/23/21 ?3785 10/24/21 ?0128 10/24/21 ?8850  ?WBC 9.1 6.8 5.8  ?HGB 9.8* 9.6* 10.1*  ?HCT 30.7* 29.7* 32.5*  ?PLT 117* 136* 126*  ? ?BMET ?Recent Labs  ?  10/23/21 ?0905 10/24/21 ?0128 10/24/21 ?0750  ?NA 142 140 141  ?K 3.6 3.3* 3.6  ?CL 110 109 110  ?CO2 '25 25 24  '$ ?GLUCOSE 92 112* 113*  ?BUN '9 8 9  '$ ?CREATININE 0.81 0.77 0.71  ?CALCIUM 8.6* 8.6* 8.8*  ? ?LFT ?Recent Labs  ?  10/22/21 ?2242 10/23/21 ?2774 10/24/21 ?0750  ?PROT 5.7*   < > 5.4*  ?ALBUMIN 3.0*   < > 2.6*  ?AST 34   < > 29  ?ALT 18   < > 16  ?ALKPHOS 58   < > 86  ?BILITOT 2.3*   < > 1.2  ?BILIDIR 0.6*  --   --   ?IBILI 1.7*  --   --   ? < > = values in this interval not displayed.  ? ?PT/INR ?Recent Labs  ?  10/22/21 ?1451 10/24/21 ?0750  ?LABPROT 19.2* 17.3*  ?INR 1.6* 1.4*  ? ? ?Studies/Results: ?DG Chest 1 View ? ?Result Date:  10/23/2021 ?CLINICAL DATA:  Status post thoracentesis EXAM: CHEST  1 VIEW COMPARISON:  Chest x-ray dated Oct 22, 2021 FINDINGS: Visualized cardiac and mediastinal contours are within normal limits. Moderate pleural effusion decreased. No pneumothorax. IMPRESSION: No evidence of pneumothorax status post thoracentesis. Electronically Signed   By: Yetta Glassman M.D.   On: 10/23/2021 08:51  ? ?DG Chest 2 View ? ?Result Date: 10/22/2021 ?CLINICAL DATA:  Suspected sepsis right pleural effusion EXAM: CHEST - 2 VIEW COMPARISON:  Previous studies including the examination of 01/21/2019 FINDINGS: Transverse diameter of heart is increased. Evaluation of right mid and right lower lung fields for infiltrates is limited by the effusion. Central pulmonary vessels are more prominent. There is prominence of interstitial markings in the left parahilar region, possibly mild interstitial edema. Large right pleural effusion is seen. There is no significant left pleural effusion. There is no pneumothorax. IMPRESSION: There is opacification of right mid and right lower lung fields suggesting large right pleural effusion. Possibility of underlying atelectasis/pneumonia is not excluded. Central pulmonary vessels are more prominent. Increased interstitial markings are seen in the parahilar regions suggesting CHF. Electronically Signed   By: Royston Cowper  Rathinasamy M.D.   On: 10/22/2021 16:08  ? ?DG Abd 1 View ? ?Result Date: 10/23/2021 ?CLINICAL DATA:  Abdomen pain EXAM: ABDOMEN - 1 VIEW COMPARISON:  None Available. FINDINGS: The bowel gas pattern is normal. No radio-opaque calculi or other significant radiographic abnormality are seen. IUD in the pelvis. Hardware in the lumbar spine. IMPRESSION: Negative. Electronically Signed   By: Donavan Foil M.D.   On: 10/23/2021 22:09  ? ?CT CHEST WO CONTRAST ? ?Result Date: 10/22/2021 ?CLINICAL DATA:  Increasing shortness of breath and known right pleural effusion EXAM: CT CHEST WITHOUT CONTRAST TECHNIQUE:  Multidetector CT imaging of the chest was performed following the standard protocol without IV contrast. RADIATION DOSE REDUCTION: This exam was performed according to the departmental dose-optimization program which includes automated exposure control, adjustment of the mA and/or kV according to patient size and/or use of iterative reconstruction technique. COMPARISON:  Chest x-ray from earlier in the same day. FINDINGS: Cardiovascular: Limited due to lack of IV contrast. No aneurysmal dilatation is seen. No cardiac enlargement is noted. No coronary calcifications are seen. Mediastinum/Nodes: Thoracic inlet demonstrates scattered calcifications within the left lobe of the thyroid. No discrete nodule is noted. No hilar or mediastinal adenopathy is noted. The esophagus as visualized is within normal limits. Lungs/Pleura: Left lung is well aerated. A small 5 mm ground-glass nodule is noted in the left upper lobe best seen on image number 26 of series 5. Near complete opacification of the right hemithorax is noted secondary to large pleural effusion. No discrete diaphragmatic defect is identified. The aerated right lung appears within normal limits predominately in the upper lobe. Consolidation in the right middle and lower lobe is seen. No discrete mass is noted although evaluation is limited due to lack of IV contrast. Upper Abdomen: Visualized upper abdomen demonstrates cirrhotic changes of the liver. Scattered small gallstones are seen. Mild ascites is seen as well. Musculoskeletal: No chest wall mass or suspicious bone lesions identified. IMPRESSION: Large right-sided pleural effusion with right middle and lower lobe consolidation. Residual ascites in the abdomen following paracentesis. Cirrhotic changes of liver are seen. Small gallstones without definitive complicating factors. 5 mm left ground-glass pulmonary nodule within the upper lobe. No routine follow-up imaging is recommended per Fleischner Society  Guidelines. These guidelines do not apply to immunocompromised patients and patients with cancer. Follow up in patients with significant comorbidities as clinically warranted. For lung cancer screening, adhere to Lung-RADS guidelines. Reference: Radiology. 2017; 284(1):228-43. Scattered calcifications in the left lobe of the thyroid. No discrete nodule is noted Electronically Signed   By: Inez Catalina M.D.   On: 10/22/2021 21:07  ? ?DG CHEST PORT 1 VIEW ? ?Result Date: 10/24/2021 ?CLINICAL DATA:  Shortness of breath.  History of cirrhosis. EXAM: PORTABLE CHEST 1 VIEW COMPARISON:  AP chest 10/23/2021, chest two views 10/22/2021, CT chest 10/22/2021 FINDINGS: There is again a moderate right pleural effusion with associated right mid lung heterogeneous airspace opacity. The left lung is clear. No pneumothorax. Cardiac silhouette and mediastinal contours are within normal limits. No acute skeletal abnormality. IMPRESSION: Moderate right pleural effusion, unchanged. Electronically Signed   By: Yvonne Kendall M.D.   On: 10/24/2021 08:10  ? ?ECHOCARDIOGRAM COMPLETE ? ?Result Date: 10/23/2021 ?   ECHOCARDIOGRAM REPORT   Patient Name:   Katie Holland Date of Exam: 10/23/2021 Medical Rec #:  [id number]   Height:       64.0 in Accession #:    [id number]  Weight:  153.9 lb Date of Birth:  06/25/1965   BSA:          1.750 m? Patient Age:    56 years    BP:           118/72 mmHg Patient Gender: F           HR:           87 bpm. Exam Location:  Inpatient Procedure: 2D Echo, Color Doppler and Cardiac Doppler Indications:    Elevated Troponin  History:        Patient has prior history of Echocardiogram examinations, most                 recent 09/11/2021. Signs/Symptoms:Chest Pain and Shortness of                 Breath.  Sonographer:    Joette Catching RCS Referring Phys: [place]  1. Left ventricular ejection fraction, by estimation, is 65 to 70%. The left ventricle has normal function. The left ventricle has  no regional wall motion abnormalities. Left ventricular diastolic parameters are indeterminate.  2. Right ventricular systolic function is normal. The right ventricular size is normal.  3. Left atrial size was mi

## 2021-10-24 NOTE — Evaluation (Signed)
Occupational Therapy Evaluation ?Patient Details ?Name: Katie Holland ?MRN: [id number] ?DOB: August 15, 1965 ?Today's Date: 10/24/2021 ? ? ?History of Present Illness 56 y/o female presented to ED on 10/22/21 for SOB and chest tightness after she received paracentesis earlier that day. Admitted for sepsis 2/2 E. coli bacteremia and CAP. PMH: ETOH cirrhosis  ? ?Clinical Impression ?  ?Pt admitted for concerns listed above. PTA Pt reported that she was independent with all ADL's and IADL's, using no AD. At this time, pt presents with increased weakness, balance deficits, and decreased activity tolerance. She is requiring min A for all OOB activities, as well as the use of a RW. Recommending HHOT to maximize independence and safety at home. OT will follow acutely.   ?   ? ?Recommendations for follow up therapy are one component of a multi-disciplinary discharge planning process, led by the attending physician.  Recommendations may be updated based on patient status, additional functional criteria and insurance authorization.  ? ?Follow Up Recommendations ? Home health OT  ?  ?Assistance Recommended at Discharge Set up Supervision/Assistance  ?Patient can return home with the following A little help with walking and/or transfers;A little help with bathing/dressing/bathroom ? ?  ?Functional Status Assessment ? Patient has had a recent decline in their functional status and demonstrates the ability to make significant improvements in function in a reasonable and predictable amount of time.  ?Equipment Recommendations ? Tub/shower bench;BSC/3in1  ?  ?Recommendations for Other Services   ? ? ?  ?Precautions / Restrictions Precautions ?Precautions: Fall ?Restrictions ?Weight Bearing Restrictions: No  ? ?  ? ?Mobility Bed Mobility ?Overal bed mobility: Modified Independent ?  ?  ?  ?  ?  ?  ?  ?  ? ?Transfers ?Overall transfer level: Needs assistance ?Equipment used: Rolling walker (2 wheels) ?Transfers: Sit to/from Stand ?Sit to Stand:  Min assist ?  ?  ?  ?  ?  ?General transfer comment: Min A to power up ?  ? ?  ?Balance Overall balance assessment: Needs assistance ?Sitting-balance support: No upper extremity supported, Feet supported ?Sitting balance-Leahy Scale: Good ?  ?  ?Standing balance support: Bilateral upper extremity supported, Reliant on assistive device for balance ?Standing balance-Leahy Scale: Poor ?Standing balance comment: reliant on RW for support ?  ?  ?  ?  ?  ?  ?  ?  ?  ?  ?  ?   ? ?ADL either performed or assessed with clinical judgement  ? ?ADL Overall ADL's : Needs assistance/impaired ?Eating/Feeding: Set up;Sitting ?  ?Grooming: Set up;Sitting ?  ?Upper Body Bathing: Set up;Sitting ?  ?Lower Body Bathing: Sit to/from stand;Minimal assistance;Sitting/lateral leans ?  ?Upper Body Dressing : Set up;Sitting ?  ?Lower Body Dressing: Minimal assistance;Sitting/lateral leans;Sit to/from stand ?  ?Toilet Transfer: Minimal assistance;Ambulation ?  ?Toileting- Clothing Manipulation and Hygiene: Minimal assistance;Sitting/lateral lean;Sit to/from stand ?  ?  ?  ?Functional mobility during ADLs: Minimal assistance;Rolling walker (2 wheels) ?General ADL Comments: increased weaknes, requiring min A  ? ? ? ?Vision Baseline Vision/History: 0 No visual deficits ?Ability to See in Adequate Light: 0 Adequate ?Patient Visual Report: No change from baseline ?Vision Assessment?: No apparent visual deficits  ?   ?Perception   ?  ?Praxis   ?  ? ?Pertinent Vitals/Pain Pain Assessment ?Pain Assessment: Faces ?Faces Pain Scale: Hurts even more ?Pain Location: abdomen ?Pain Descriptors / Indicators: Grimacing, Guarding ?Pain Intervention(s): Monitored during session, Repositioned  ? ? ? ?Hand Dominance Right ?  ?Extremity/Trunk  Assessment Upper Extremity Assessment ?Upper Extremity Assessment: Generalized weakness ?  ?Lower Extremity Assessment ?Lower Extremity Assessment: Generalized weakness ?  ?Cervical / Trunk Assessment ?Cervical / Trunk  Assessment: Normal ?  ?Communication Communication ?Communication: No difficulties ?  ?Cognition Arousal/Alertness: Awake/alert ?Behavior During Therapy: [facility] for tasks assessed/performed ?Overall Cognitive Status: Within Functional Limits for tasks assessed ?  ?  ?  ?  ?  ?  ?  ?  ?  ?  ?  ?  ?  ?  ?  ?  ?  ?  ?  ?General Comments  VSS on RA ? ?  ?Exercises   ?  ?Shoulder Instructions    ? ? ?Home Living Family/patient expects to be discharged to:: Private residence ?Living Arrangements: Alone ?Available Help at Discharge: Family ?Type of Home: Apartment ?Home Access: Level entry ?  ?  ?Home Layout: One level ?  ?  ?Bathroom Shower/Tub: Tub/shower unit ?  ?Bathroom Toilet: Standard ?  ?  ?Home Equipment: Conservation officer, nature (2 wheels);Cane - single point ?  ?  ?  ? ?  ?Prior Functioning/Environment Prior Level of Function : Independent/Modified Independent ?  ?  ?  ?  ?  ?  ?Mobility Comments: uses cane for short distance and RW for long distances ?  ?  ? ?  ?  ?OT Problem List: Decreased strength;Decreased activity tolerance;Impaired balance (sitting and/or standing) ?  ?   ?OT Treatment/Interventions: Self-care/ADL training;Therapeutic exercise;Energy conservation;DME and/or AE instruction;Therapeutic activities;Patient/family education;Balance training  ?  ?OT Goals(Current goals can be found in the care plan section) Acute Rehab OT Goals ?Patient Stated Goal: To go home ?OT Goal Formulation: With patient ?Time For Goal Achievement: 11/07/21 ?Potential to Achieve Goals: Good ?ADL Goals ?Pt Will Perform Grooming: with modified independence;standing ?Pt Will Perform Lower Body Bathing: with modified independence;sitting/lateral leans;sit to/from stand ?Pt Will Perform Lower Body Dressing: with modified independence;sitting/lateral leans;sit to/from stand ?Pt Will Transfer to Toilet: with modified independence;ambulating ?Pt Will Perform Toileting - Clothing Manipulation and hygiene: with modified  independence;sitting/lateral leans;sit to/from stand  ?OT Frequency: Min 2X/week ?  ? ?Co-evaluation   ?  ?  ?  ?  ? ?  ?AM-PAC OT "6 Clicks" Daily Activity     ?Outcome Measure Help from another person eating meals?: A Little ?Help from another person taking care of personal grooming?: A Little ?Help from another person toileting, which includes using toliet, bedpan, or urinal?: A Little ?Help from another person bathing (including washing, rinsing, drying)?: A Little ?Help from another person to put on and taking off regular upper body clothing?: A Little ?Help from another person to put on and taking off regular lower body clothing?: A Little ?6 Click Score: 18 ?  ?End of Session Equipment Utilized During Treatment: Rolling walker (2 wheels) ?Nurse Communication: Mobility status ? ?Activity Tolerance: Patient tolerated treatment well ?Patient left: in bed;with call bell/phone within reach;with family/visitor present ? ?OT Visit Diagnosis: Unsteadiness on feet (R26.81);Other abnormalities of gait and mobility (R26.89);Muscle weakness (generalized) (M62.81)  ?              ?Time: 3846-6599 ?OT Time Calculation (min): 15 min ?Charges:  OT General Charges ?$OT Visit: 1 Visit ?OT Evaluation ?$OT Eval Moderate Complexity: 1 Mod ? ?Fares Ramthun H., OTR/L ?Acute Rehabilitation ? ?Katie Holland Elane Yolanda Katie Holland ?10/24/2021, 6:53 PM ?

## 2021-10-24 NOTE — Progress Notes (Signed)
?PROGRESS NOTE ? ? ? ?Katie Holland  VOZ:[id number] DOB: 1965-09-18 DOA: 10/22/2021 ?PCP: Billie Ruddy, MD  ? ?Brief Narrative:  ?The patient is a 56 year old African-American female with past medical history significant for but not limited to alcoholic liver failure, history of alcohol abuse, history of tobacco abuse, history of esophageal varices, history of upper GI bleed as well as other comorbidities who presented with worsening shortness of breath and increased O2 requirement.  She underwent an ultrasound-guided therapeutic paracentesis that yielded about 9.8 L of clear yellow fluid and had tolerated well and was administered 75 g of albumin.  Subsequently she is noted to be short of breath and had an elevated heart rate and high blood pressure even after paracentesis.  She is noted to be saturating 89% and increased work of bleeding and to be placed on 4 L of supplemental oxygen which improved her O2 saturations 96%.  There is attempt to wean her from her 4 L but because she failed to do so she was sent the ED for further evaluation.  She is noted to be somewhat more lethargic than usual and she was on home Lasix and spironolactone.  She also had some abdominal pain and does not drink anymore and her last drink was 3 months ago.  She continues to smoke cigarettes 6 to 7 cigarettes a day and think about quitting.  She had no bleeding and her last thoracentesis was 9 months ago she stated.  While in the ED she is noted to have a lactic acid of 2.3 and a chest x-ray that was worrisome for her right middle right lower lobe large right pleural effusion but also could be pneumonia versus CHF.  GI was notified of patient's admission and blood cultures and sputum cultures were obtained.  She underwent a CT of the chest which showed a right large sided pleural effusion with right middle lobe and right lower lobe consolidation as well as residual ascites in the abdomen following paracentesis.  She had cirrhotic changes  of the liver that were seen.  She is admitted for severe sepsis given that her lactic acid was level greater than 2 and that she had new oxygen requirement of 4 L.  She was placed on antibiotics azithromycin and ceftriaxone.  She is found to have 1 out of 4 bottles of E. coli bacteremia with possible pneumonia and she is continued on ceftriaxone and azithromycin.  IR was also consulted and she underwent a 1.8 L thoracentesis and there was question removed with there this was a hepatic hydrothorax.  She is continue on ceftriaxone and azithromycin and she did have large ascites that is refractory to high-dose diuretics.  GI evaluated and recommended resuming her home diuretic doses of Lasix 40 mg twice daily spironolactone 20 mg daily follow-up for a low sodium diet.  Patient was stressed to get a TIPS however this was canceled in the setting of her bacteremia ? ?GI has initiated her back on her home diuretics but if her renal function is okay there we will try increasing as she tolerates tomorrow.  They are recommending a discharge that she stay on Lasix 40 mg twice daily and spironolactone 200 g daily but they may increase it while she is in house.  Repeat chest x-ray today showed a moderate pleural effusion again but she is off of her supplemental oxygen and not complaining of any SOB.  ? ?Assessment and Plan: ? ?Severe sepsis in the setting of E. coli bacteremia with  concomitant pneumonia ?-Had a new O2 requirement and was found to have a very large consolidation and pleural effusion which was tapped and revealed 1.9  ?-Blood cultures grew 1 out of 4 E. coli and she is currently on IV ceftriaxone and azithromycin which we will continue ?-Source of infection is likely her pulmonary/intra-abdominal and she did meet severe sepsis criteria given that she had endorgan damage with a lactic acid of 2.3 which then trended up to 3.8.  She did have a new O2 requirement and had acute respiratory failure with  hypoxia ?-Continue antibiotics and continue evaluation.  She is given IV fluid hydration however now will discontinue given that she is going to be aggressively diuresed ?-Patient had a community-acquired pneumonia as she presented with productive cough, fever, hypoxia and infiltrate in the right lower lobe chest x-ray consistent with pneumonia.  We will check a strep pneumo urine antigen and urine Legionella antigen ?-Procalcitonin level was elevated 3.38 and she had an elevated lactic Level and it trended up from 2.3 -> 3.8 ?-Repeat CXR this AM showed "There is again a moderate right pleural effusion with associated right mid lung heterogeneous airspace opacity. The left lung is clear. No pneumothorax. Cardiac  silhouette and mediastinal contours are within normal limits. No acute skeletal abnormality." ?-PT/OT recommending Home Health  ?  ?Acute Respiratory Failure with Hypoxia in the setting of pleural effusion and community pneumonia ?-Continue with treatment as above with antibiotics and diuretics  ?-Continuous pulse oximetry maintain O2 saturation greater than 90% ?-Continue supplemental oxygen via nasal cannula wean O2 as tolerated ?-SpO2: 99 % ?O2 Flow Rate (L/min): 4 L/min ?-Initiated Xopenex/Atrovent q6hprn ?-Flutter Valve, Incentive Spirometry, and Guaifenesin 1200 mg po BID  ?-Repeat chest x-ray in the a.m. and she will need an ambulatory home O2 screen prior to discharge ?  ?Decompensated Alcoholic cirrhosis of the liver with ascites that is refractory to Diuretics ?Hyperbilirubinemia ?-Her MELD NA score was 15 and she is followed by GI ?-She is possibly having a TIPS procedure by IR but GIs been consulted and this will be placed on hold given her new bacteremia ?-GI is following and she has had serial large-volume paracentesis at least twice a month.  Her last paracentesis yesterday yielded 9.8 L.  Current nucleated cell count was 294 with 25% neutrophils but she did not meet criteria for SBP.  She  continues to have right-sided tenderness on examination. ?-Patient's T. bili went from 2.3 is now 1.8 -> 1.2 x2 ?-GI is recommending resuming her home diuretics of 40 mg of Lasix twice daily and spironolactone 20 mg daily allowing for low-sodium diet; continue to follow renal function ?-She did receive 1 dose of IV Lasix 40 milligrams in the ED ?-Patient was to get a TIPS this was electively scheduled however this is now canceled given that the timing of the future this needs to be per IR recommendations given the clearance of her bacteremia; given recurrent bacteremia and acute illness there is no urgent need for TIPS procedure and IR recommending once she is fully recovered can consider rescheduling the procedure ?  ?Esophageal varices ?-Gastroenterology following ?-Currently not on propanolol ?  ?Tobacco abuse and dependence ?-Smoking cessation counseling given ?-Nicotine patch has been ordered ?  ?Hyperammonemia ?-She had an elevated ammonia level at 59 with asterixis and so lactulose was started at 20 grams po BID ?-Repeat Ammonial level in the AM  ?  ?Elevated troponin ?-Likely demand ischemia; she had chest pain on right side which  was due to a pleural effusion and consolidation ?-Recently had echo done last month which showed normal motion abnormalities and no evidence of CHF she is getting a repeat echocardiogram now ?-Troponins were cycled and they were elevated but relatively flat as they went from 217 -> 234 -> 210 -> 216 ?  ?Decreased TSH ?-TSH was 0.319 but free T4 was 1.02 ?-Continue to monitor and trend and repeat TFTs in the next 4 to 6 weeks ?  ?Microcytic Anemia ?B12 deficiency ?-Patient's hemoglobin/hematocrit went from 9.0/28.3 -> 11.2/33.0 -> 9.8/30.7 -> [id number] -> 10.1/32.5 with MCV of 73.9 ?-Checked anemia panel and showed an iron level of 66, U IBC 112, TIBC 178, saturation ratios of 37%, ferritin level 22, folate level 14.3, and vitamin B12 level of 147 ?-Continue to monitor for signs and  symptoms of bleeding; no overt bleeding noted ?-GI will supplement B12 ?-Repeat CBC in a.m. ?  ?Thrombocytopenia ?-Likely chronic in setting of her cirrhosis and splenic sequestration ?-Patient's platelet count went fr

## 2021-10-24 NOTE — Progress Notes (Signed)
?  Transition of Care (TOC) Screening Note ? ? ?Patient Details  ?Name: Katie Holland ?Date of Birth: 09/04/1965 ? ? ?Transition of Care (TOC) CM/SW Contact:    ?Benard Halsted, LCSW ?Phone Number: ?10/24/2021, 9:34 AM ? ? ? ?Transition of Care Department [facility]) has reviewed patient and no TOC needs have been identified at this time. We will continue to monitor patient advancement through interdisciplinary progression rounds. If new patient transition needs arise, please place a TOC consult. ? ? ?

## 2021-10-25 ENCOUNTER — Inpatient Hospital Stay (HOSPITAL_COMMUNITY): Payer: Medicare HMO

## 2021-10-25 DIAGNOSIS — R531 Weakness: Secondary | ICD-10-CM | POA: Diagnosis not present

## 2021-10-25 DIAGNOSIS — K7031 Alcoholic cirrhosis of liver with ascites: Secondary | ICD-10-CM | POA: Diagnosis not present

## 2021-10-25 DIAGNOSIS — J90 Pleural effusion, not elsewhere classified: Secondary | ICD-10-CM | POA: Diagnosis not present

## 2021-10-25 DIAGNOSIS — R7989 Other specified abnormal findings of blood chemistry: Secondary | ICD-10-CM | POA: Diagnosis not present

## 2021-10-25 DIAGNOSIS — J189 Pneumonia, unspecified organism: Secondary | ICD-10-CM | POA: Diagnosis not present

## 2021-10-25 DIAGNOSIS — R6520 Severe sepsis without septic shock: Secondary | ICD-10-CM | POA: Diagnosis not present

## 2021-10-25 DIAGNOSIS — A419 Sepsis, unspecified organism: Secondary | ICD-10-CM | POA: Diagnosis not present

## 2021-10-25 DIAGNOSIS — J9601 Acute respiratory failure with hypoxia: Secondary | ICD-10-CM | POA: Diagnosis not present

## 2021-10-25 HISTORY — PX: IR THORACENTESIS ASP PLEURAL SPACE W/IMG GUIDE: IMG5380

## 2021-10-25 LAB — CBC WITH DIFFERENTIAL/PLATELET
Abs Immature Granulocytes: 0.01 10*3/uL (ref 0.00–0.07)
Basophils Absolute: 0 10*3/uL (ref 0.0–0.1)
Basophils Relative: 0 %
Eosinophils Absolute: 0.3 10*3/uL (ref 0.0–0.5)
Eosinophils Relative: 6 %
HCT: 28.8 % — ABNORMAL LOW (ref 36.0–46.0)
Hemoglobin: 9.5 g/dL — ABNORMAL LOW (ref 12.0–15.0)
Immature Granulocytes: 0 %
Lymphocytes Relative: 25 %
Lymphs Abs: 1.4 10*3/uL (ref 0.7–4.0)
MCH: 23.8 pg — ABNORMAL LOW (ref 26.0–34.0)
MCHC: 33 g/dL (ref 30.0–36.0)
MCV: 72 fL — ABNORMAL LOW (ref 80.0–100.0)
Monocytes Absolute: 0.5 10*3/uL (ref 0.1–1.0)
Monocytes Relative: 10 %
Neutro Abs: 3.1 10*3/uL (ref 1.7–7.7)
Neutrophils Relative %: 59 %
Platelets: 142 10*3/uL — ABNORMAL LOW (ref 150–400)
RBC: 4 MIL/uL (ref 3.87–5.11)
RDW: 23.6 % — ABNORMAL HIGH (ref 11.5–15.5)
WBC: 5.3 10*3/uL (ref 4.0–10.5)
nRBC: 0 % (ref 0.0–0.2)

## 2021-10-25 LAB — COMPREHENSIVE METABOLIC PANEL
ALT: 17 U/L (ref 0–44)
AST: 26 U/L (ref 15–41)
Albumin: 2.4 g/dL — ABNORMAL LOW (ref 3.5–5.0)
Alkaline Phosphatase: 76 U/L (ref 38–126)
Anion gap: 6 (ref 5–15)
BUN: 8 mg/dL (ref 6–20)
CO2: 23 mmol/L (ref 22–32)
Calcium: 8.5 mg/dL — ABNORMAL LOW (ref 8.9–10.3)
Chloride: 109 mmol/L (ref 98–111)
Creatinine, Ser: 0.74 mg/dL (ref 0.44–1.00)
GFR, Estimated: >60 mL/min (ref >60)
Glucose, Bld: 105 mg/dL — ABNORMAL HIGH (ref 70–99)
Potassium: 3.5 mmol/L (ref 3.5–5.1)
Sodium: 138 mmol/L (ref 135–145)
Total Bilirubin: 0.7 mg/dL (ref 0.3–1.2)
Total Protein: 5.2 g/dL — ABNORMAL LOW (ref 6.5–8.1)

## 2021-10-25 LAB — CULTURE, BLOOD (ROUTINE X 2): Special Requests: ADEQUATE

## 2021-10-25 LAB — BODY FLUID CELL COUNT WITH DIFFERENTIAL
Eos, Fluid: 0 %
Lymphs, Fluid: 47 %
Monocyte-Macrophage-Serous Fluid: 37 % — ABNORMAL LOW (ref 50–90)
Neutrophil Count, Fluid: 16 % (ref 0–25)
Total Nucleated Cell Count, Fluid: 272 cu mm (ref 0–1000)

## 2021-10-25 LAB — ALBUMIN, PLEURAL OR PERITONEAL FLUID: Albumin, Fluid: <1.5 g/dL

## 2021-10-25 LAB — GRAM STAIN

## 2021-10-25 LAB — LACTATE DEHYDROGENASE, PLEURAL OR PERITONEAL FLUID: LD, Fluid: 80 U/L — ABNORMAL HIGH (ref 3–23)

## 2021-10-25 LAB — PHOSPHORUS: Phosphorus: 3.3 mg/dL (ref 2.5–4.6)

## 2021-10-25 LAB — GLUCOSE, PLEURAL OR PERITONEAL FLUID: Glucose, Fluid: 109 mg/dL

## 2021-10-25 LAB — PATHOLOGIST SMEAR REVIEW

## 2021-10-25 LAB — AMMONIA: Ammonia: 70 umol/L — ABNORMAL HIGH (ref 9–35)

## 2021-10-25 LAB — PROTEIN, PLEURAL OR PERITONEAL FLUID: Total protein, fluid: <3 g/dL

## 2021-10-25 LAB — MAGNESIUM: Magnesium: 1.7 mg/dL (ref 1.7–2.4)

## 2021-10-25 MED ORDER — LIDOCAINE HCL 1 % IJ SOLN
INTRAMUSCULAR | Status: AC
  Filled 2021-10-25: qty 20

## 2021-10-25 MED ORDER — SPIRONOLACTONE 100 MG PO TABS
300.0000 mg | ORAL_TABLET | Freq: Every day | ORAL | Status: DC
  Administered 2021-10-26 – 2021-11-02 (×7): 300 mg via ORAL
  Filled 2021-10-25 (×8): qty 3

## 2021-10-25 MED ORDER — LACTULOSE 10 GM/15ML PO SOLN
20.0000 g | Freq: Two times a day (BID) | ORAL | Status: DC
  Administered 2021-10-25 – 2021-11-08 (×25): 20 g via ORAL
  Filled 2021-10-25 (×27): qty 30

## 2021-10-25 MED ORDER — FUROSEMIDE 40 MG PO TABS
40.0000 mg | ORAL_TABLET | Freq: Three times a day (TID) | ORAL | Status: DC
  Administered 2021-10-25 – 2021-11-02 (×21): 40 mg via ORAL
  Filled 2021-10-25 (×22): qty 1

## 2021-10-25 NOTE — Progress Notes (Addendum)
Physical Therapy Treatment ?Patient Details ?Name: Katie Holland ?MRN: [id number] ?DOB: 11/05/1965 ?Today's Date: 10/25/2021 ? ? ?History of Present Illness 56 y/o female presented to ED on 10/22/21 for SOB and chest tightness after she received paracentesis earlier that day. Admitted for sepsis 2/2 E. coli bacteremia and CAP. PMH: ETOH cirrhosis ? ?  ?PT Comments  ? ? Pt was seen for mobility with no OOB activity as pt was too tired from her bath earlier.  Pt did assist with all exercises, notably struggling with abd and flexion of hips due to being tight in the low back.  Did not have any complaints of discomfort from the abd today, and is expecting to dc to home when medically ready.  Follow up with pt for goals of PT and continue to encourage her to walk and be OOB in chair as tolerated.  Pt is definitely demonstrating better control of LE's, especially as compared to chart description of LE strength during initial efforts to walk.  HHPT recommended and pt will have family involved with her care as well.   ?Recommendations for follow up therapy are one component of a multi-disciplinary discharge planning process, led by the attending physician.  Recommendations may be updated based on patient status, additional functional criteria and insurance authorization. ? ?Follow Up Recommendations ? Home health PT ?  ?  ?Assistance Recommended at Discharge Intermittent Supervision/Assistance  ?Patient can return home with the following A little help with walking and/or transfers;A little help with bathing/dressing/bathroom;Assistance with cooking/housework;Assist for transportation;Help with stairs or ramp for entrance ?  ?Equipment Recommendations ? None recommended by PT  ?  ?Recommendations for Other Services   ? ? ?  ?Precautions / Restrictions Precautions ?Precautions: Fall ?Restrictions ?Weight Bearing Restrictions: No  ?  ? ?Mobility ? Bed Mobility ?Overal bed mobility: Modified Independent ?  ?  ?  ?  ?  ?  ?  ?   ? ?Transfers ?  ?  ?  ?  ?  ?  ?  ?  ?  ?General transfer comment: deferred due to fatigue ?  ? ?Ambulation/Gait ?  ?  ?  ?  ?  ?  ?  ?General Gait Details: declined due to fatigue ? ? ?Stairs ?  ?  ?  ?  ?  ? ? ?Wheelchair Mobility ?  ? ?Modified Rankin (Stroke Patients Only) ?  ? ? ?  ?Balance   ?  ?  ?  ?  ?  ?  ?  ?  ?  ?  ?  ?  ?  ?  ?  ?  ?  ?  ?  ? ?  ?Cognition Arousal/Alertness: Awake/alert ?Behavior During Therapy: [facility] for tasks assessed/performed ?Overall Cognitive Status: Within Functional Limits for tasks assessed ?  ?  ?  ?  ?  ?  ?  ?  ?  ?  ?  ?  ?  ?  ?  ?  ?  ?  ?  ? ?  ?Exercises General Exercises - Lower Extremity ?Ankle Circles/Pumps: AAROM, 5 reps ?Quad Sets: AROM, 10 reps ?Gluteal Sets: AROM, 10 reps ?Heel Slides: AAROM, 10 reps ?Hip ABduction/ADduction: AAROM, 10 reps ? ?  ?General Comments General comments (skin integrity, edema, etc.): pt was seen for bed ex due to declining OOB.  pt is noting stiffness and soreness on back, not in abd as previously reported ?  ?  ? ?Pertinent Vitals/Pain Pain Assessment ?Pain Assessment: Faces ?Faces Pain Scale: Hurts  little more ?Pain Location: back pain ?Pain Descriptors / Indicators: Guarding ?Pain Intervention(s): Limited activity within patient's tolerance, Monitored during session, Repositioned  ? ? ?Home Living   ?  ?  ?  ?  ?  ?  ?  ?  ?  ?   ?  ?Prior Function    ?  ?  ?   ? ?PT Goals (current goals can now be found in the care plan section)   ? ?  ?Frequency ? ? ? Min 3X/week ? ? ? ?  ?PT Plan Current plan remains appropriate  ? ? ?Co-evaluation   ?  ?  ?  ?  ? ?  ?AM-PAC PT "6 Clicks" Mobility   ?Outcome Measure ? Help needed turning from your back to your side while in a flat bed without using bedrails?: None ?Help needed moving from lying on your back to sitting on the side of a flat bed without using bedrails?: None ?Help needed moving to and from a bed to a chair (including a wheelchair)?: A Little ?Help needed standing up from a chair  using your arms (e.g., wheelchair or bedside chair)?: A Little ?Help needed to walk in hospital room?: A Little ?Help needed climbing 3-5 steps with a railing? : A Little ?6 Click Score: 20 ? ?  ?End of Session   ?Activity Tolerance: Patient limited by pain ?Patient left: in bed;with call bell/phone within reach ?Nurse Communication: Mobility status ?PT Visit Diagnosis: Muscle weakness (generalized) (M62.81);Unsteadiness on feet (R26.81);Pain ?Pain - Right/Left:  (back) ?Pain - part of body:  (back) ?  ? ? ?Time: 0623-7628 ?PT Time Calculation (min) (ACUTE ONLY): 14 min ? ?Charges:  $Therapeutic Exercise: 8-22 mins       ?Ramond Dial ?10/25/2021, 12:16 PM ? ?Mee Hives, PT PhD ?Acute Rehab Dept. Number: [facility] [phone] and [place] [phone] ? ? ?

## 2021-10-25 NOTE — Progress Notes (Signed)
HOSPITAL MEDICINE OVERNIGHT EVENT NOTE   ? ?Notified by nursing that patient is exhibiting a yellow MEWS score. ? ?Chart reviewed, patient currently hospitalized for sepsis with E. coli bacteremia and pneumonia.  Patient is currently receiving intravenous antibiotics with ceftriaxone and azithromycin. ? ?Patient is hemodynamically stable with nursing reporting no new clinical symptoms. ? ?We will continue to monitor, continue plan of care for now. ? ? ?Vernelle Emerald  MD ?[facility]  ? ? ? ? ? ? ? ? ? ? ?

## 2021-10-25 NOTE — Progress Notes (Signed)
? ? Progress Note ? ? Subjective  ?Chief Complaint: Cirrhosis with pleural effusion ? ?This morning patient is found lying in bed with her husband by her bedside.  She tells me she feels about the same but is more short of breath when up and walking around.  The hospitalist is in the room and explains that she has fluid on her lung again.  He is going to order another thoracentesis.  As for her abdomen it feels about the same for her with no increase in distention or any discomfort. ? ? Objective  ? ?Vital signs in last 24 hours: ?Temp:  [97.2 ?F (36.2 ?C)-98.6 ?F (37 ?C)] 98.6 ?F (37 ?C) (05/05 0818) ?Pulse Rate:  [77-112] 110 (05/05 0818) ?Resp:  [16-20] 17 (05/05 0818) ?BP: (101-124)/(57-78) 117/66 (05/05 0818) ?SpO2:  [91 %-99 %] 94 % (05/05 0818) ?Weight:  [57.5 kg] 57.5 kg (05/04 1520) ?Last BM Date : 10/22/21 ?General:    Chronically ill AA female in NAD ?Heart:  Regular rate and rhythm; no murmurs ?Lungs: Respirations decreased on the right and unlabored, lungs CTA bilaterally ?Abdomen:  Soft, nontender and mild distension. Normal bowel sounds. ?Extremities:  Without edema. ?Psych:  Cooperative. Normal mood and affect. ? ?Intake/Output from previous day: ?05/04 0701 - 05/05 0700 ?In: 349.8 [I.V.:3; IV Piggyback:346.8] ?Out: 400 [Urine:400] ? ?Lab Results: ?Recent Labs  ?  10/24/21 ?0128 10/24/21 ?1884 10/25/21 ?1660  ?WBC 6.8 5.8 5.3  ?HGB 9.6* 10.1* 9.5*  ?HCT 29.7* 32.5* 28.8*  ?PLT 136* 126* 142*  ? ?BMET ?Recent Labs  ?  10/24/21 ?0128 10/24/21 ?0750 10/25/21 ?0239  ?NA 140 141 138  ?K 3.3* 3.6 3.5  ?CL 109 110 109  ?CO2 '25 24 23  '$ ?GLUCOSE 112* 113* 105*  ?BUN '8 9 8  '$ ?CREATININE 0.77 0.71 0.74  ?CALCIUM 8.6* 8.8* 8.5*  ? ?LFT ?Recent Labs  ?  10/22/21 ?2242 10/23/21 ?6301 10/25/21 ?0239  ?PROT 5.7*   < > 5.2*  ?ALBUMIN 3.0*   < > 2.4*  ?AST 34   < > 26  ?ALT 18   < > 17  ?ALKPHOS 58   < > 76  ?BILITOT 2.3*   < > 0.7  ?BILIDIR 0.6*  --   --   ?IBILI 1.7*  --   --   ? < > = values in this interval not  displayed.  ? ?PT/INR ?Recent Labs  ?  10/22/21 ?1451 10/24/21 ?0750  ?LABPROT 19.2* 17.3*  ?INR 1.6* 1.4*  ? ? ?Studies/Results: ?DG Abd 1 View ? ?Result Date: 10/23/2021 ?CLINICAL DATA:  Abdomen pain EXAM: ABDOMEN - 1 VIEW COMPARISON:  None Available. FINDINGS: The bowel gas pattern is normal. No radio-opaque calculi or other significant radiographic abnormality are seen. IUD in the pelvis. Hardware in the lumbar spine. IMPRESSION: Negative. Electronically Signed   By: Donavan Foil M.D.   On: 10/23/2021 22:09  ? ?DG CHEST PORT 1 VIEW ? ?Result Date: 10/25/2021 ?CLINICAL DATA:  Shortness of breath, history of cirrhosis EXAM: PORTABLE CHEST 1 VIEW COMPARISON:  Radiograph 1 day prior FINDINGS: The cardiomediastinal silhouette is grossly stable. There is a large right pleural effusion, increased in size since the study from 1 day prior, with worsened aeration of the right lung. The left lung is clear. There is no significant left effusion. There is no pneumothorax There is no acute osseous abnormality. IMPRESSION: Large right pleural effusion, increased in size since the study from 1 day prior with worsened aeration of the right  lung. Electronically Signed   By: Valetta Mole M.D.   On: 10/25/2021 08:24  ? ?DG CHEST PORT 1 VIEW ? ?Result Date: 10/24/2021 ?CLINICAL DATA:  Shortness of breath.  History of cirrhosis. EXAM: PORTABLE CHEST 1 VIEW COMPARISON:  AP chest 10/23/2021, chest two views 10/22/2021, CT chest 10/22/2021 FINDINGS: There is again a moderate right pleural effusion with associated right mid lung heterogeneous airspace opacity. The left lung is clear. No pneumothorax. Cardiac silhouette and mediastinal contours are within normal limits. No acute skeletal abnormality. IMPRESSION: Moderate right pleural effusion, unchanged. Electronically Signed   By: Yvonne Kendall M.D.   On: 10/24/2021 08:10   ? ? ? ? Assessment / Plan:   ?Assessment: ?1.  Decompensated alcoholic cirrhosis: MELD-NA 14-15, ?Right pleural  effusion status post 1.9 L thoracentesis 5/3 repeat x-ray this morning showing larger pleural effusion, CT concerning for pneumonia, work of breathing has increased overnight, per hospitalist and for repeat thoracentesis today ?2.  Thrombocytopenia and mild anemia ?3.  Alcohol abuse ? ?Plan: ?1.  Patient was started back on her home diuretic regimen 5/3 which includes Lasix 40 mg twice a day and Spironolactone 200 mg daily.  Will let Dr. Havery Moros review and see if she could possibly be on increased dose.  Also hard to know if these are helping or not at the moment given that they were just recently started. ?2.  Continue low-sodium diet ?3.  Again in the future patient would benefit from TIPS when she is able ?4. Agree with repeat thoracentesis today ? ?Thank you for your kind consultation. ? ? ? LOS: 3 days  ? ?Katie Holland [facility]  10/25/2021, 11:37 AM ? ?  ?

## 2021-10-25 NOTE — Procedures (Signed)
PROCEDURE SUMMARY: ? ?Successful US guided right thoracentesis. ?Yielded 1.0 liters of dark yellow fluid. ?Pt tolerated procedure well. ?No immediate complications. ? ?Specimen was sent for labs. ?CXR ordered. ? ?EBL < 5 mL ? ?Docia Barrier PA-C ?10/25/2021 ?4:36 PM ? ? ? ?

## 2021-10-25 NOTE — Progress Notes (Signed)
?PROGRESS NOTE ? ? ? ?Katie Holland  CXF:[id number] DOB: 09/14/1965 DOA: 10/22/2021 ?PCP: Billie Ruddy, MD  ? ?Brief Narrative:  ?The patient is a 56 year old African-American female with past medical history significant for but not limited to alcoholic liver failure, history of alcohol abuse, history of tobacco abuse, history of esophageal varices, history of upper GI bleed as well as other comorbidities who presented with worsening shortness of breath and increased O2 requirement.  She underwent an ultrasound-guided therapeutic paracentesis that yielded about 9.8 L of clear yellow fluid and had tolerated well and was administered 75 g of albumin.  Subsequently she is noted to be short of breath and had an elevated heart rate and high blood pressure even after paracentesis.  She is noted to be saturating 89% and increased work of bleeding and to be placed on 4 L of supplemental oxygen which improved her O2 saturations 96%.  There is attempt to wean her from her 4 L but because she failed to do so she was sent the ED for further evaluation.  She is noted to be somewhat more lethargic than usual and she was on home Lasix and spironolactone.  She also had some abdominal pain and does not drink anymore and her last drink was 3 months ago.  She continues to smoke cigarettes 6 to 7 cigarettes a day and think about quitting.  She had no bleeding and her last thoracentesis was 9 months ago she stated.  While in the ED she is noted to have a lactic acid of 2.3 and a chest x-ray that was worrisome for her right middle right lower lobe large right pleural effusion but also could be pneumonia versus CHF.  GI was notified of patient's admission and blood cultures and sputum cultures were obtained.  She underwent a CT of the chest which showed a right large sided pleural effusion with right middle lobe and right lower lobe consolidation as well as residual ascites in the abdomen following paracentesis.  She had cirrhotic changes  of the liver that were seen.  She is admitted for severe sepsis given that her lactic acid was level greater than 2 and that she had new oxygen requirement of 4 L.  She was placed on antibiotics azithromycin and ceftriaxone.  She is found to have 1 out of 4 bottles of E. coli bacteremia with possible pneumonia and she is continued on ceftriaxone and azithromycin.  IR was also consulted and she underwent a 1.8 L thoracentesis and there was question removed with there this was a hepatic hydrothorax.  She is continue on ceftriaxone and azithromycin and she did have large ascites that is refractory to high-dose diuretics.  GI evaluated and recommended resuming her home diuretic doses of Lasix 40 mg twice daily spironolactone 20 mg daily follow-up for a low sodium diet.  Patient was stressed to get a TIPS however this was canceled in the setting of her bacteremia ? ?GI has initiated her back on her home diuretics but if her renal function is okay there we will try increasing as she tolerates tomorrow.  They are recommending a discharge that she stay on Lasix 40 mg twice daily and spironolactone 200 g daily but they may increase it while she is in house.  Repeat chest x-ray yesterday showed a moderate pleural effusion again and today CXR showed "Large right pleural effusion, increased in size since the study from 1 day prior with worsened aeration of the right lung." ? ?Reviewing her worsening large  right pleural effusion and because of her shortness of breath while ambulation a repeat thoracentesis was ordered.  GI recommending continuing diuretics and may increase the dose and recommending continuing low-sodium diet.  ? ?Assessment and Plan: ? ?Severe sepsis in the setting of E. coli bacteremia with concomitant pneumonia ?-Had a new O2 requirement and was found to have a very large consolidation and pleural effusion which was tapped and revealed 1.9  ?-Blood cultures grew 1 out of 4 E. coli and she is currently on IV  ceftriaxone and azithromycin which we will continue ?-Source of infection is likely her pulmonary/intra-abdominal and she did meet severe sepsis criteria given that she had endorgan damage with a lactic acid of 2.3 which then trended up to 3.8.  She did have a new O2 requirement and had acute respiratory failure with hypoxia ?-Continue antibiotics and continue evaluation.  She is given IV fluid hydration however now will discontinue given that she is going to be aggressively diuresed ?-C/w Abx of Ceftriaxone and Azithromycin  ?-Patient had a community-acquired pneumonia as she presented with productive cough, fever, hypoxia and infiltrate in the right lower lobe chest x-ray consistent with pneumonia.  We will check a strep pneumo urine antigen and urine Legionella antigen ?-Procalcitonin level was elevated 3.38 and she had an elevated lactic Level and it trended up from 2.3 -> 3.8 ?-Repeat CXR this AM showed "The cardiomediastinal silhouette is grossly stable. There is a large right pleural effusion, increased in size since the study from 1 day prior, with worsened aeration of the right  lung. The left lung is clear. There is no significant left effusion. There is no pneumothorax.There is no acute osseous abnormality.." ?-PT/OT recommending Home Health  ?  ?Acute Respiratory Failure with Hypoxia in the setting of pleural effusion and community pneumonia ?-Continue with treatment as above with antibiotics and diuretics  ?-Continuous pulse oximetry maintain O2 saturation greater than 90% ?-Continue supplemental oxygen via nasal cannula wean O2 as tolerated ?-SpO2: 95 % ?O2 Flow Rate (L/min): 4 L/min ?-Initiated Xopenex/Atrovent q6hprn ?-Flutter Valve, Incentive Spirometry, and Guaifenesin 1200 mg po BID  ?-Repeat chest x-ray in the a.m. and she will need an ambulatory home O2 screen prior to discharge ?  ?Decompensated Alcoholic cirrhosis of the liver with ascites that is refractory to  Diuretics ?Hyperbilirubinemia ?-Her MELD NA score was 15 and she is followed by GI ?-She is possibly having a TIPS procedure by IR but GIs been consulted and this will be placed on hold given her new bacteremia ?-GI is following and she has had serial large-volume paracentesis at least twice a month.  Her last paracentesis yesterday yielded 9.8 L.  Current nucleated cell count was 294 with 25% neutrophils but she did not meet criteria for SBP.  She continues to have right-sided tenderness on examination. ?-Patient's T. bili went from 2.3 is now 1.8 -> 1.2 x2 -> 0.7 ?-GI is recommending resuming her home diuretics of 40 mg of Lasix twice daily and spironolactone 200 mg daily allowing for low-sodium diet but now GI is wanting to increase her diuretics to 300 mg of spironolactone daily and 40 mg of Lasix 3 times daily; continue to follow renal function closely ?-She did receive 1 dose of IV Lasix 40 milligrams in the ED ?-Patient was to get a TIPS this was electively scheduled however this is now canceled given that the timing of the future this needs to be per IR recommendations given the clearance of her bacteremia; given recurrent  bacteremia and acute illness there is no urgent need for TIPS procedure and IR recommending once she is fully recovered can consider rescheduling the procedure ?  ?Esophageal varices ?-Gastroenterology following ?-Currently not on propanolol ?  ?Tobacco abuse and dependence ?-Smoking cessation counseling given ?-Nicotine patch has been ordered ?  ?Hyperammonemia ?-She had an elevated ammonia level at 59 with asterixis and so lactulose was started at 10 grams po BID but repeat ammonia level today was 70 so we will increase the lactulose dose to 20 g p.o. twice daily ?-Repeat Ammonial level in the AM  ?  ?Elevated troponin ?-Likely demand ischemia; she had chest pain on right side which was due to a pleural effusion and consolidation ?-Recently had echo done last month which showed normal  motion abnormalities and no evidence of CHF she is getting a repeat echocardiogram now ?-Troponins were cycled and they were elevated but relatively flat as they went from 217 -> 234 -> 210 -> 216 ?  ?Decreased TSH ?-TSH was 0.319 but f

## 2021-10-25 NOTE — Care Management Important Message (Signed)
Important Message ? ?Patient Details  ?Name: Katie Holland ?MRN: [id number] ?Date of Birth: [birth year] ? ? ?Medicare Important Message Given:  Yes ? ? ? ? ?Beckham Capistran ?10/25/2021, 4:36 PM ?

## 2021-10-25 NOTE — TOC Initial Note (Signed)
Transition of Care (TOC) - Initial/Assessment Note  ? ? ?Patient Details  ?Name: Katie Holland ?MRN: [id number] ?Date of Birth: 1965-08-09 ? ?Transition of Care [facility]) CM/SW Contact:    ?Katie Meeker, RN ?Phone Number: ?10/25/2021, 11:38 AM ? ?Clinical Narrative: Case manager spoke with patient's daughter, Katie Holland [phone], to discuss recommendation for Home Health services for her mom. [name] has no preference and deferred to [facility] to find highly rated agency that will accept patient's insurance. Case manager called referral to [facility], liaison with [facility]. They have accepted patient. She will have family support at discharge. DME has been requested and will be delivered to patient's room.  TOC team will continue to follow.             ? ? ?Expected Discharge Plan: [place] ?Barriers to Discharge: Continued Medical Work up ? ? ?Patient Goals and CMS Choice ?  ?  ?Choice offered to / list presented to : Adult Children ? ?Expected Discharge Plan and Services ?Expected Discharge Plan: [place] ?  ?Discharge Planning Services: CM Consult ?Post Acute Care Choice: Durable Medical Equipment, Home Health ?Living arrangements for the past 2 months: [place] ?                ?DME Arranged: 3-N-1, Tub bench ?DME Agency: [employer] ?Date DME Agency Contacted: 10/25/21 ?Time DME Agency Contacted: 1165 ?Representative spoke with at DME Agency: Katie Holland ?HH Arranged: PT, OT ?[place] Agency:  ([facility]) ?Date HH Agency Contacted: 10/25/21 ?Time [place]: 1130 ?Representative spoke with at [place] ? ?Prior Living Arrangements/Services ?Living arrangements for the past 2 months: [place] ?Lives with:: Self ?Patient language and need for interpreter reviewed:: Yes ?Do you feel safe going back to the place where you live?: Yes      ?Need for Family Participation in Patient Care: Yes (Comment) ?Care giver support system in place?: Yes  (comment) ?  ?Criminal Activity/Legal Involvement Pertinent to Current Situation/Hospitalization: No - Comment as needed ? ?Activities of Daily Living ?Home Assistive Devices/Equipment: Cane (specify quad or straight), Eyeglasses, Scales ?ADL Screening (condition at time of admission) ?Patient's cognitive ability adequate to safely complete daily activities?: Yes ?Is the patient deaf or have difficulty hearing?: No ?Does the patient have difficulty seeing, even when wearing glasses/contacts?: No ?Does the patient have difficulty concentrating, remembering, or making decisions?: No ?Patient able to express need for assistance with ADLs?: No ?Does the patient have difficulty dressing or bathing?: No ?Independently performs ADLs?: No ?Communication: Needs assistance ?Is this a change from baseline?: Pre-admission baseline ?Dressing (OT): Independent ?Grooming: Independent ?Feeding: Independent ?Bathing: Independent ?Toileting: Independent ?In/Out Bed: Needs assistance ?Is this a change from baseline?: Pre-admission baseline ?Walks in Home: Needs assistance ?Is this a change from baseline?: Pre-admission baseline ?Does the patient have difficulty walking or climbing stairs?: Yes ?Weakness of Legs: Both ?Weakness of Arms/Hands: Both ? ?Permission Sought/Granted ?  ?  ?   ?   ?   ?   ? ?Emotional Assessment ?  ?  ?  ?Orientation: : Oriented to Self, Oriented to Place, Oriented to  Time, Oriented to Situation ?Alcohol / Substance Use: Not Applicable ?Psych Involvement: No (comment) ? ?Admission diagnosis:  SOB (shortness of breath) [R06.02] ?Sepsis ([place]) [A41.9] ?Patient Active Problem List  ? Diagnosis Date Noted  ? Elevated lactic acid level 10/23/2021  ? Sepsis ([place]) 10/22/2021  ? Acute respiratory failure with hypoxia ([place]) 10/22/2021  ?  CAP (community acquired pneumonia) 10/22/2021  ? Increased ammonia level 10/22/2021  ? Elevated troponin 10/22/2021  ? Abnormal TSH 10/22/2021  ? Adenomatous duodenal polyp   ?  Gastritis and gastroduodenitis   ? Colon cancer screening   ? Encephalopathy, hepatic ([place]) 05/06/2019  ? Ascites 01/20/2019  ? Pleural effusion 01/20/2019  ? UTI (urinary tract infection) 01/20/2019  ? Acute upper GI bleeding 06/02/2018  ? Alcohol dependence syndrome ([place]) 06/02/2018  ? Decompensated hepatic cirrhosis ([place])   ? Esophageal varices (H[facility])   ? Alcoholic liver disease ([place])   ? Protein-calorie malnutrition, severe 04/26/2018  ? Bleeding esophageal varices ([facility])   ? Abdominal pain 04/24/2018  ? Hematemesis 04/24/2018  ? Hematochezia 04/24/2018  ? Alcohol abuse 04/24/2018  ? Hyponatremia 04/24/2018  ? Hypokalemia 04/24/2018  ? Metabolic acidosis, normal anion gap (NAG) 04/24/2018  ? History of GI bleed 10/26/2017  ? Alcoholic cirrhosis of liver with ascites ([place]) 10/26/2017  ? Tobacco dependence 10/26/2017  ? ?PCP:  Billie Ruddy, MD ?Pharmacy:   ?[facility] [place] - [address] ?[place] ?[place] [address] ?Phone: [phone] Fax: [phone] ? ?[id number] [place], [place] - [address] ?[id number]?Phone: [phone]: [phone]? ? ? ? ?Social Determinants of Health (SDOH) Interventions ?  ? ?Readmission Risk Interventions ?   ? View : No data to display.  ?  ?  ?  ? ? ? ?

## 2021-10-26 ENCOUNTER — Inpatient Hospital Stay (HOSPITAL_COMMUNITY): Payer: Medicare HMO

## 2021-10-26 DIAGNOSIS — J90 Pleural effusion, not elsewhere classified: Secondary | ICD-10-CM | POA: Diagnosis not present

## 2021-10-26 DIAGNOSIS — R6520 Severe sepsis without septic shock: Secondary | ICD-10-CM | POA: Diagnosis not present

## 2021-10-26 DIAGNOSIS — K7031 Alcoholic cirrhosis of liver with ascites: Secondary | ICD-10-CM | POA: Diagnosis not present

## 2021-10-26 DIAGNOSIS — A419 Sepsis, unspecified organism: Secondary | ICD-10-CM | POA: Diagnosis not present

## 2021-10-26 DIAGNOSIS — R7989 Other specified abnormal findings of blood chemistry: Secondary | ICD-10-CM | POA: Diagnosis not present

## 2021-10-26 LAB — CBC WITH DIFFERENTIAL/PLATELET
Abs Immature Granulocytes: 0.03 10*3/uL (ref 0.00–0.07)
Basophils Absolute: 0 10*3/uL (ref 0.0–0.1)
Basophils Relative: 1 %
Eosinophils Absolute: 0.3 10*3/uL (ref 0.0–0.5)
Eosinophils Relative: 6 %
HCT: 28 % — ABNORMAL LOW (ref 36.0–46.0)
Hemoglobin: 9.2 g/dL — ABNORMAL LOW (ref 12.0–15.0)
Immature Granulocytes: 1 %
Lymphocytes Relative: 26 %
Lymphs Abs: 1.5 10*3/uL (ref 0.7–4.0)
MCH: 23.4 pg — ABNORMAL LOW (ref 26.0–34.0)
MCHC: 32.9 g/dL (ref 30.0–36.0)
MCV: 71.2 fL — ABNORMAL LOW (ref 80.0–100.0)
Monocytes Absolute: 0.8 10*3/uL (ref 0.1–1.0)
Monocytes Relative: 13 %
Neutro Abs: 3.1 10*3/uL (ref 1.7–7.7)
Neutrophils Relative %: 53 %
Platelets: 144 10*3/uL — ABNORMAL LOW (ref 150–400)
RBC: 3.93 MIL/uL (ref 3.87–5.11)
RDW: 23.8 % — ABNORMAL HIGH (ref 11.5–15.5)
WBC: 5.7 10*3/uL (ref 4.0–10.5)
nRBC: 0 % (ref 0.0–0.2)

## 2021-10-26 LAB — COMPREHENSIVE METABOLIC PANEL
ALT: 16 U/L (ref 0–44)
AST: 25 U/L (ref 15–41)
Albumin: 2.4 g/dL — ABNORMAL LOW (ref 3.5–5.0)
Alkaline Phosphatase: 83 U/L (ref 38–126)
Anion gap: 4 — ABNORMAL LOW (ref 5–15)
BUN: 9 mg/dL (ref 6–20)
CO2: 24 mmol/L (ref 22–32)
Calcium: 8.6 mg/dL — ABNORMAL LOW (ref 8.9–10.3)
Chloride: 108 mmol/L (ref 98–111)
Creatinine, Ser: 0.75 mg/dL (ref 0.44–1.00)
GFR, Estimated: >60 mL/min (ref >60)
Glucose, Bld: 97 mg/dL (ref 70–99)
Potassium: 3.7 mmol/L (ref 3.5–5.1)
Sodium: 136 mmol/L (ref 135–145)
Total Bilirubin: 0.7 mg/dL (ref 0.3–1.2)
Total Protein: 5.1 g/dL — ABNORMAL LOW (ref 6.5–8.1)

## 2021-10-26 LAB — AMMONIA: Ammonia: 52 umol/L — ABNORMAL HIGH (ref 9–35)

## 2021-10-26 LAB — ZINC: Zinc: 37 ug/dL — ABNORMAL LOW (ref 44–115)

## 2021-10-26 LAB — MAGNESIUM: Magnesium: 1.7 mg/dL (ref 1.7–2.4)

## 2021-10-26 LAB — GLUCOSE, CAPILLARY: Glucose-Capillary: 109 mg/dL — ABNORMAL HIGH (ref 70–99)

## 2021-10-26 LAB — VITAMIN B6: Vitamin B6: 2.8 ug/L — ABNORMAL LOW (ref 3.4–65.2)

## 2021-10-26 LAB — PHOSPHORUS: Phosphorus: 3.2 mg/dL (ref 2.5–4.6)

## 2021-10-26 MED ORDER — MAGNESIUM SULFATE 2 GM/50ML IV SOLN
2.0000 g | Freq: Once | INTRAVENOUS | Status: AC
  Administered 2021-10-26: 2 g via INTRAVENOUS
  Filled 2021-10-26: qty 50

## 2021-10-26 MED ORDER — ZINC SULFATE 220 (50 ZN) MG PO CAPS
220.0000 mg | ORAL_CAPSULE | Freq: Every day | ORAL | Status: DC
Start: 2021-10-26 — End: 2021-11-08
  Administered 2021-10-26 – 2021-11-08 (×13): 220 mg via ORAL
  Filled 2021-10-26 (×13): qty 1

## 2021-10-26 MED ORDER — VITAMIN D 25 MCG (1000 UNIT) PO TABS
1000.0000 [IU] | ORAL_TABLET | Freq: Every day | ORAL | Status: DC
  Administered 2021-10-26 – 2021-11-08 (×13): 1000 [IU] via ORAL
  Filled 2021-10-26 (×13): qty 1

## 2021-10-26 NOTE — Progress Notes (Signed)
? ? ? ? Progress Note ? ? Subjective  ?Patient feeling a bit better today post thoracentesis yesterday. Mildly tachycardic. ? ? Objective  ? ?Vital signs in last 24 hours: ?Temp:  [98.4 ?F (36.9 ?C)-99 ?F (37.2 ?C)] 98.5 ?F (36.9 ?C) (05/06 0749) ?Pulse Rate:  [97-111] 100 (05/06 0749) ?Resp:  [17-24] 17 (05/06 0749) ?BP: (89-106)/(54-70) 90/60 (05/06 0749) ?SpO2:  [93 %-99 %] 93 % (05/06 0749) ?Last BM Date : 10/25/21 ?General:    AA female in NAD ?Abdomen:  Soft, distended with ascites  ?Extremities:  Without edema. ?Neurologic:  Alert and oriented,  grossly normal neurologically. ?Psych:  Cooperative. Normal mood and affect. ? ?Intake/Output from previous day: ?05/05 0701 - 05/06 0700 ?In: -  ?Out: [id number] [[id number]] ?Intake/Output this shift: ?No intake/output data recorded. ? ?Lab Results: ?Recent Labs  ?  10/24/21 ?0750 10/25/21 ?0239 10/26/21 ?0133  ?WBC 5.8 5.3 5.7  ?HGB 10.1* 9.5* 9.2*  ?HCT 32.5* 28.8* 28.0*  ?PLT 126* 142* 144*  ? ?BMET ?Recent Labs  ?  10/24/21 ?0750 10/25/21 ?0239 10/26/21 ?0133  ?NA 141 138 136  ?K 3.6 3.5 3.7  ?CL 110 109 108  ?CO2 '24 23 24  '$ ?GLUCOSE 113* 105* 97  ?BUN '9 8 9  '$ ?CREATININE 0.71 0.74 0.75  ?CALCIUM 8.8* 8.5* 8.6*  ? ?LFT ?Recent Labs  ?  10/26/21 ?0133  ?PROT 5.1*  ?ALBUMIN 2.4*  ?AST 25  ?ALT 16  ?ALKPHOS 83  ?BILITOT 0.7  ? ?PT/INR ?Recent Labs  ?  10/24/21 ?0750  ?LABPROT 17.3*  ?INR 1.4*  ? ? ?Studies/Results: ?DG CHEST PORT 1 VIEW ? ?Result Date: 10/26/2021 ?CLINICAL DATA:  Shortness of breath.  Right pleural effusion. EXAM: PORTABLE CHEST 1 VIEW COMPARISON:  Portable chest yesterday at 4:11 p.m. FINDINGS: 4:40 a.m., 10/26/2021. By report of the last study the patient underwent a 1 L right thoracentesis yesterday. Moderate layering right pleural effusion is again noted without appreciable pneumothorax. There is adjacent hazy atelectasis or consolidation in the right lower lung field. Rest of the lungs are clear. The cardiac size is normal. The left sulci are sharp. No  acute osseous abnormality. IMPRESSION: No pneumothorax is seen following yesterday's thoracentesis. Moderate right pleural effusion remains with overlying atelectasis or consolidation. Overall aeration seems unchanged. Electronically Signed   By: Telford Nab M.D.   On: 10/26/2021 06:02  ? ?DG CHEST PORT 1 VIEW ? ?Result Date: 10/25/2021 ?CLINICAL DATA:  Reason for exam: Right thoracenteiss 1.0 liter EXAM: PORTABLE CHEST - 1 VIEW COMPARISON:  Earlier film of the same day FINDINGS: No pneumothorax. Significant improvement of the right pleural effusion with moderate residual. Improved aeration with some residual consolidation/atelectasis at the right lung base. Left lung is clear. Heart size and mediastinal contours are within normal limits. Visualized bones unremarkable. IMPRESSION: No pneumothorax post right thoracentesis. Electronically Signed   By: Lucrezia Europe M.D.   On: 10/25/2021 16:29  ? ?DG CHEST PORT 1 VIEW ? ?Result Date: 10/25/2021 ?CLINICAL DATA:  Shortness of breath, history of cirrhosis EXAM: PORTABLE CHEST 1 VIEW COMPARISON:  Radiograph 1 day prior FINDINGS: The cardiomediastinal silhouette is grossly stable. There is a large right pleural effusion, increased in size since the study from 1 day prior, with worsened aeration of the right lung. The left lung is clear. There is no significant left effusion. There is no pneumothorax There is no acute osseous abnormality. IMPRESSION: Large right pleural effusion, increased in size since the study from 1 day prior with worsened  aeration of the right lung. Electronically Signed   By: Valetta Mole M.D.   On: 10/25/2021 08:24  ? ?IR THORACENTESIS ASP PLEURAL SPACE W/IMG GUIDE ? ?Result Date: 10/26/2021 ?INDICATION: Patient with history of cirrhosis and recurrent ascites, now with recurrent pleural effusion. Request made for diagnostic thoracentesis. EXAM: ULTRASOUND GUIDED RIGHT THORACENTESIS MEDICATIONS: 10 mL 1% lidocaine COMPLICATIONS: None immediate. PROCEDURE: An  ultrasound guided thoracentesis was thoroughly discussed with the patient and questions answered. The benefits, risks, alternatives and complications were also discussed. The patient understands and wishes to proceed with the procedure. Written consent was obtained. Ultrasound was performed to localize and mark an adequate pocket of fluid in the right chest. The area was then prepped and draped in the normal sterile fashion. 1% Lidocaine was used for local anesthesia. Under ultrasound guidance a 6 Fr Safe-T-Centesis catheter was introduced. Thoracentesis was performed. The catheter was removed and a dressing applied. FINDINGS: A total of approximately 1.0 liters of dark yellow fluid was removed. Samples were sent to the laboratory as requested by the clinical team. IMPRESSION: Successful ultrasound guided right thoracentesis yielding of pleural fluid. Read by: Brynda Greathouse PA-C Electronically Signed   By: Lucrezia Europe M.D.   On: 10/26/2021 08:56   ? ? ? ? Assessment / Plan:   ? ?56 y/o female with decompensated alcoholic cirrhosis with refractory ascites and now hepatic hydrothorax. Was scheduled for TIPS as outpatient this week but unfortunately had respiratory distress from large effusion and found to be bacteremic / septic with E coli thought to be related to pneumonia. ? ?We have started to increase her oral diuretics. She has been noncompliant with her regimen as outpatient (states she was only taking one of the diuretics). Lasix increased to 120gm / day and aldactone to '300mg'$  / day for today. Will see how her renal function does with this regimen. TIPS has been postponed until she recovers from her infection, but I think this will really help her ascites / effusion moving forward. ? ?Continue ABx and diuretic dosing for today. Will follow, call with questions. ? ?Jolly Mango, MD ?[facility] ? ? ? ? ?

## 2021-10-26 NOTE — Progress Notes (Signed)
?PROGRESS NOTE ? ? ? ?Katie Holland  MVH:[id number] DOB: [birth year] DOA: 10/22/2021 ?PCP: Billie Ruddy, MD  ? ?Brief Narrative:  ?The patient is a 56 year old African-American female with past medical history significant for but not limited to alcoholic liver failure, history of alcohol abuse, history of tobacco abuse, history of esophageal varices, history of upper GI bleed as well as other comorbidities who presented with worsening shortness of breath and increased O2 requirement.  She underwent an ultrasound-guided therapeutic paracentesis that yielded about 9.8 L of clear yellow fluid and had tolerated well and was administered 75 g of albumin.  Subsequently she is noted to be short of breath and had an elevated heart rate and high blood pressure even after paracentesis.  She is noted to be saturating 89% and increased work of bleeding and to be placed on 4 L of supplemental oxygen which improved her O2 saturations 96%.  There is attempt to wean her from her 4 L but because she failed to do so she was sent the ED for further evaluation.  She is noted to be somewhat more lethargic than usual and she was on home Lasix and spironolactone.  She also had some abdominal pain and does not drink anymore and her last drink was 3 months ago.  She continues to smoke cigarettes 6 to 7 cigarettes a day and think about quitting.  She had no bleeding and her last thoracentesis was 9 months ago she stated.  While in the ED she is noted to have a lactic acid of 2.3 and a chest x-ray that was worrisome for her right middle right lower lobe large right pleural effusion but also could be pneumonia versus CHF.  GI was notified of patient's admission and blood cultures and sputum cultures were obtained.  She underwent a CT of the chest which showed a right large sided pleural effusion with right middle lobe and right lower lobe consolidation as well as residual ascites in the abdomen following paracentesis.  She had cirrhotic changes  of the liver that were seen.  She is admitted for severe sepsis given that her lactic acid was level greater than 2 and that she had new oxygen requirement of 4 L.  She was placed on antibiotics azithromycin and ceftriaxone.  She is found to have 1 out of 4 bottles of E. coli bacteremia with possible pneumonia and she is continued on ceftriaxone and azithromycin.  IR was also consulted and she underwent a 1.8 L thoracentesis and there was question removed with there this was a hepatic hydrothorax.  She is continue on ceftriaxone and azithromycin and she did have large ascites that is refractory to high-dose diuretics.  GI evaluated and recommended resuming her home diuretic doses of Lasix 40 mg twice daily spironolactone 20 mg daily follow-up for a low sodium diet.  Patient was stressed to get a TIPS however this was canceled in the setting of her bacteremia ? ?GI has initiated her back on her home diuretics but if her renal function is okay there we will try increasing as she tolerates tomorrow.  They are recommending a discharge that she stay on Lasix 40 mg twice daily and spironolactone 200 g daily but they may increase it while she is in house.  Repeat chest x-ray yesterday showed a moderate pleural effusion again and today CXR showed "Large right pleural effusion, increased in size since the study from 1 day prior with worsened aeration of the right lung." ? ?Repeat CXR showed worsening  large right pleural effusion and because of her shortness of breath while ambulation a repeat thoracentesis was ordered.  GI recommending continuing diuretics and may increase the dose and recommending continuing low-sodium diet. ? ?She is s/p Thoracentesis again and 1 Liter was removed on 10/25/21.  GI has increased her diuretics to 40 mg of Lasix 3 times daily and 300 mg spironolactone daily however I walked in the room she is eating a large bag of Lays potato chips and I advised her about her salt intake.  ? ?Assessment and  Plan: ? ?Severe sepsis in the setting of E. coli bacteremia with concomitant pneumonia ?-Had a new O2 requirement and was found to have a very large consolidation and pleural effusion which was tapped and revealed 1.9  ?-Blood cultures grew 1 out of 4 E. coli and she is currently on IV ceftriaxone and azithromycin which we will continue ?-Source of infection is likely her pulmonary/intra-abdominal and she did meet severe sepsis criteria given that she had endorgan damage with a lactic acid of 2.3 which then trended up to 3.8.  She did have a new O2 requirement and had acute respiratory failure with hypoxia ?-Continue antibiotics and continue evaluation.  She is given IV fluid hydration however now will discontinue given that she is going to be aggressively diuresed ?-C/w Abx of Ceftriaxone and Azithromycin  ?-Patient had a community-acquired pneumonia as she presented with productive cough, fever, hypoxia and infiltrate in the right lower lobe chest x-ray consistent with pneumonia.  We will check a strep pneumo urine antigen and urine Legionella antigen ?-Procalcitonin level was elevated 3.38 and she had an elevated lactic Level and it trended up from 2.3 -> 3.8 ?-Repeat CXR this AM showed "No pneumothorax is seen following yesterday's thoracentesis. Moderate right pleural effusion remains with overlying atelectasis or consolidation. Overall aeration seems unchanged." ?-PT/OT recommending Home Health  ?  ?Acute Respiratory Failure with Hypoxia in the setting of pleural effusion and community pneumonia ?-Continue with treatment as above with antibiotics and diuretics  ?-Continuous pulse oximetry maintain O2 saturation greater than 90% ?-Continue supplemental oxygen via nasal cannula wean O2 as tolerated ?-SpO2: 90 % ?O2 Flow Rate (L/min): 4 L/min ?-Initiated Xopenex/Atrovent q6hprn ?-Flutter Valve, Incentive Spirometry, and Guaifenesin 1200 mg po BID  ?-Repeated Thoracentesis yesterday and has had 2 this Admission   ?-Repeat chest x-ray in the a.m. and she will need an ambulatory home O2 screen prior to discharge ?  ?Decompensated Alcoholic cirrhosis of the liver with ascites that is refractory to Diuretics ?Hyperbilirubinemia ?-Her MELD NA score was 15 and she is followed by GI ?-She is possibly having a TIPS procedure by IR but GIs been consulted and this will be placed on hold given her new bacteremia ?-GI is following and she has had serial large-volume paracentesis at least twice a month.  Her last paracentesis yesterday yielded 9.8 L.  Current nucleated cell count was 294 with 25% neutrophils but she did not meet criteria for SBP.  She continues to have right-sided tenderness on examination. ?-Patient's T. bili went from 2.3 is now 1.8 -> 1.2 x2 -> 0.7 x2 ?-GI is recommending resuming her home diuretics of 40 mg of Lasix twice daily and spironolactone 200 mg daily allowing for low-sodium diet but now GI is wanting to increase her diuretics to 300 mg of spironolactone daily and 40 mg of Lasix 3 times daily; continue to follow renal function closely ?-She did receive 1 dose of IV Lasix 40 milligrams in the  ED ?-Patient was to get a TIPS this was electively scheduled however this is now canceled given that the timing of the future this needs to be per IR recommendations given the clearance of her bacteremia; given recurrent bacteremia and acute illness there is no urgent need for TIPS procedure and IR recommending once she is fully recovered can consider rescheduling the procedure ?  ?Esophageal Varices ?-Gastroenterology following ?-Currently not on propanolol ?  ?Tobacco abuse and dependence ?-Smoking cessation counseling given ?-Nicotine patch has been ordered ?  ?Hyperammonemia ?-She had an elevated ammonia level at 59 with asterixis and so lactulose was started at 10 grams po BID but repeat ammonia level today was 70 so we will increase the lactulose dose to 20 g p.o. twice daily ?-Now Repeat Ammonia Level is  52 ?-Repeat Ammonial level in the AM  ?  ?Elevated Troponin ?-Likely demand ischemia; she had chest pain on right side which was due to a pleural effusion and consolidation ?-Recently had echo done last month which

## 2021-10-26 NOTE — Progress Notes (Signed)
?   10/26/21 0027  ?Assess: MEWS Score  ?Temp 98.7 ?F (37.1 ?C)  ?BP 92/61  ?Pulse Rate (!) 106  ?ECG Heart Rate (!) 106  ?Resp (!) 24  ?Level of Consciousness Alert  ?SpO2 96 %  ?O2 Device Room Air  ?Assess: MEWS Score  ?MEWS Temp 0  ?MEWS Systolic 1  ?MEWS Pulse 1  ?MEWS RR 1  ?MEWS LOC 0  ?MEWS Score 3  ?MEWS Score Color Yellow  ?Assess: if the MEWS score is Yellow or Red  ?Were vital signs taken at a resting state? Yes  ?Focused Assessment No change from prior assessment  ?Early Detection of Sepsis Score *See Row Information* Low  ?MEWS guidelines implemented *See Row Information* Yes  ?Treat  ?Pain Scale 0-10  ?Pain Score 0  ?Notify: Charge Nurse/RN  ?Name of Charge Nurse/RN Notified Aaron Mose  ?Date Charge Nurse/RN Notified 10/26/21  ?Time Charge Nurse/RN Notified 0050  ?Notify: Provider  ?Provider Name/Title Valeta Harms  ?Date Provider Notified 10/26/21  ?Time Provider Notified [phone]  ?Notification Type Page  ?Notification Reason Other (Comment) ?(MEWS)  ?Provider response No new orders  ?Date of Provider Response 10/26/21  ?Time of Provider Response 0115  ? ? ?

## 2021-10-27 ENCOUNTER — Inpatient Hospital Stay (HOSPITAL_COMMUNITY): Payer: Medicare HMO

## 2021-10-27 DIAGNOSIS — R112 Nausea with vomiting, unspecified: Secondary | ICD-10-CM

## 2021-10-27 DIAGNOSIS — R6520 Severe sepsis without septic shock: Secondary | ICD-10-CM | POA: Diagnosis not present

## 2021-10-27 DIAGNOSIS — K7031 Alcoholic cirrhosis of liver with ascites: Secondary | ICD-10-CM | POA: Diagnosis not present

## 2021-10-27 DIAGNOSIS — J90 Pleural effusion, not elsewhere classified: Secondary | ICD-10-CM | POA: Diagnosis not present

## 2021-10-27 DIAGNOSIS — A419 Sepsis, unspecified organism: Secondary | ICD-10-CM | POA: Diagnosis not present

## 2021-10-27 LAB — CBC WITH DIFFERENTIAL/PLATELET
Abs Immature Granulocytes: 0.03 10*3/uL (ref 0.00–0.07)
Basophils Absolute: 0 10*3/uL (ref 0.0–0.1)
Basophils Relative: 1 %
Eosinophils Absolute: 0.4 10*3/uL (ref 0.0–0.5)
Eosinophils Relative: 6 %
HCT: 26.7 % — ABNORMAL LOW (ref 36.0–46.0)
Hemoglobin: 8.8 g/dL — ABNORMAL LOW (ref 12.0–15.0)
Immature Granulocytes: 1 %
Lymphocytes Relative: 24 %
Lymphs Abs: 1.6 10*3/uL (ref 0.7–4.0)
MCH: 23.5 pg — ABNORMAL LOW (ref 26.0–34.0)
MCHC: 33 g/dL (ref 30.0–36.0)
MCV: 71.4 fL — ABNORMAL LOW (ref 80.0–100.0)
Monocytes Absolute: 1 10*3/uL (ref 0.1–1.0)
Monocytes Relative: 15 %
Neutro Abs: 3.6 10*3/uL (ref 1.7–7.7)
Neutrophils Relative %: 53 %
Platelets: 143 10*3/uL — ABNORMAL LOW (ref 150–400)
RBC: 3.74 MIL/uL — ABNORMAL LOW (ref 3.87–5.11)
RDW: 23.9 % — ABNORMAL HIGH (ref 11.5–15.5)
WBC: 6.5 10*3/uL (ref 4.0–10.5)
nRBC: 0 % (ref 0.0–0.2)

## 2021-10-27 LAB — PHOSPHORUS: Phosphorus: 2.7 mg/dL (ref 2.5–4.6)

## 2021-10-27 LAB — COMPREHENSIVE METABOLIC PANEL
ALT: 15 U/L (ref 0–44)
AST: 27 U/L (ref 15–41)
Albumin: 2.3 g/dL — ABNORMAL LOW (ref 3.5–5.0)
Alkaline Phosphatase: 79 U/L (ref 38–126)
Anion gap: 6 (ref 5–15)
BUN: 8 mg/dL (ref 6–20)
CO2: 23 mmol/L (ref 22–32)
Calcium: 8.4 mg/dL — ABNORMAL LOW (ref 8.9–10.3)
Chloride: 107 mmol/L (ref 98–111)
Creatinine, Ser: 0.82 mg/dL (ref 0.44–1.00)
GFR, Estimated: >60 mL/min (ref >60)
Glucose, Bld: 116 mg/dL — ABNORMAL HIGH (ref 70–99)
Potassium: 3.7 mmol/L (ref 3.5–5.1)
Sodium: 136 mmol/L (ref 135–145)
Total Bilirubin: 0.7 mg/dL (ref 0.3–1.2)
Total Protein: 5 g/dL — ABNORMAL LOW (ref 6.5–8.1)

## 2021-10-27 LAB — CULTURE, BLOOD (ROUTINE X 2): Culture: NO GROWTH

## 2021-10-27 LAB — AMMONIA: Ammonia: 47 umol/L — ABNORMAL HIGH (ref 9–35)

## 2021-10-27 LAB — MAGNESIUM: Magnesium: 1.8 mg/dL (ref 1.7–2.4)

## 2021-10-27 MED ORDER — MAGNESIUM SULFATE 2 GM/50ML IV SOLN
2.0000 g | Freq: Once | INTRAVENOUS | Status: AC
  Administered 2021-10-27: 2 g via INTRAVENOUS
  Filled 2021-10-27: qty 50

## 2021-10-27 MED ORDER — PROCHLORPERAZINE EDISYLATE 10 MG/2ML IJ SOLN
10.0000 mg | Freq: Four times a day (QID) | INTRAMUSCULAR | Status: DC | PRN
  Administered 2021-10-27 – 2021-11-02 (×2): 10 mg via INTRAVENOUS
  Filled 2021-10-27 (×2): qty 2

## 2021-10-27 MED ORDER — ONDANSETRON HCL 4 MG/2ML IJ SOLN
4.0000 mg | Freq: Four times a day (QID) | INTRAMUSCULAR | Status: DC | PRN
  Administered 2021-10-27 – 2021-11-05 (×3): 4 mg via INTRAVENOUS
  Filled 2021-10-27 (×3): qty 2

## 2021-10-27 NOTE — Progress Notes (Signed)
? ? ? ?   Progress Note ? ? Subjective  ?Patient states she is feeling a bit better. Still has fluid on her lung based on xray yesterday. Bag of potato chips noted at bedside. ? ? Objective  ? ?Vital signs in last 24 hours: ?Temp:  [98.1 ?F (36.7 ?C)-99.3 ?F (37.4 ?C)] 98.6 ?F (37 ?C) (05/07 0308) ?Pulse Rate:  [64-102] 100 (05/07 0308) ?Resp:  [17-21] 19 (05/07 0308) ?BP: (82-111)/(60-69) 97/62 (05/07 0308) ?SpO2:  [90 %-98 %] 91 % (05/07 0308) ?Last BM Date : 10/25/21 ?General:  AA female in NAD ?Abdomen:  Soft, distended with ascites  ?Neurologic:  Alert and oriented,  grossly normal neurologically. ?Psych:  Cooperative. Normal mood and affect. ? ?Intake/Output from previous day: ?05/06 0701 - 05/07 0700 ?In: -  ?Out: 500 [Urine:500] ?Intake/Output this shift: ?No intake/output data recorded. ? ?Lab Results: ?Recent Labs  ?  10/25/21 ?0239 10/26/21 ?0133 10/27/21 ?0110  ?WBC 5.3 5.7 6.5  ?HGB 9.5* 9.2* 8.8*  ?HCT 28.8* 28.0* 26.7*  ?PLT 142* 144* 143*  ? ?BMET ?Recent Labs  ?  10/25/21 ?0239 10/26/21 ?0133 10/27/21 ?0110  ?NA 138 136 136  ?K 3.5 3.7 3.7  ?CL 109 108 107  ?CO2 '23 24 23  '$ ?GLUCOSE 105* 97 116*  ?BUN '8 9 8  '$ ?CREATININE 0.74 0.75 0.82  ?CALCIUM 8.5* 8.6* 8.4*  ? ?LFT ?Recent Labs  ?  10/27/21 ?0110  ?PROT 5.0*  ?ALBUMIN 2.3*  ?AST 27  ?ALT 15  ?ALKPHOS 79  ?BILITOT 0.7  ? ?PT/INR ?Recent Labs  ?  10/24/21 ?0750  ?LABPROT 17.3*  ?INR 1.4*  ? ? ? Assessment / Plan:   ? ?56 y/o female with decompensated alcoholic cirrhosis with refractory ascites and now hepatic hydrothorax. Was scheduled for TIPS as outpatient last week but unfortunately had respiratory distress from large pulmonary effusion / hepatic hydrothorax and found to be bacteremic / septic with E coli thought to be related to pneumonia. ?  ?We have increased her diuretics - today will be day 2 of aldactone '300mg'$  /day and lasix '120mg'$  / day. Her renal function is tolerating this well. However, she has really struggled with compliance of her  diuretics as outpatient and this will be an issue for her moving forward. Also found potato chips at bedside and I doubt she has been compliant with low Na diet at home and we discussed that for a bit. I really think TIPS is her best option for refractory ascites / effusion given her failure with diuretics as an outpatient, she strongly wishes to have this done as soon as she can. Would be good to know from IR when they would consider dong the TIPS once treatment of her infection has been completed.  ? ?Until then, would continue diuretics and may titrate up further in next day or so if she tolerates it. Otherwise continue antibiotics. ? ?Dr. Loletha Carrow will assume her GI care tomorrow. Call with questions in the interim. ?  ?Jolly Mango, MD ?[facility] ?  ? ? ?

## 2021-10-27 NOTE — Progress Notes (Signed)
?PROGRESS NOTE ? ? ? ?Katie Holland  SWF:[id number] DOB: [birth year] DOA: 10/22/2021 ?PCP: Billie Ruddy, MD  ? ?Brief Narrative:  ?The patient is a 56 year old African-American female with past medical history significant for but not limited to alcoholic liver failure, history of alcohol abuse, history of tobacco abuse, history of esophageal varices, history of upper GI bleed as well as other comorbidities who presented with worsening shortness of breath and increased O2 requirement.  She underwent an ultrasound-guided therapeutic paracentesis that yielded about 9.8 L of clear yellow fluid and had tolerated well and was administered 75 g of albumin.  Subsequently she is noted to be short of breath and had an elevated heart rate and high blood pressure even after paracentesis.  She is noted to be saturating 89% and increased work of bleeding and to be placed on 4 L of supplemental oxygen which improved her O2 saturations 96%.  There is attempt to wean her from her 4 L but because she failed to do so she was sent the ED for further evaluation.  She is noted to be somewhat more lethargic than usual and she was on home Lasix and spironolactone.  She also had some abdominal pain and does not drink anymore and her last drink was 3 months ago.  She continues to smoke cigarettes 6 to 7 cigarettes a day and think about quitting.  She had no bleeding and her last thoracentesis was 9 months ago she stated.  While in the ED she is noted to have a lactic acid of 2.3 and a chest x-ray that was worrisome for her right middle right lower lobe large right pleural effusion but also could be pneumonia versus CHF.  GI was notified of patient's admission and blood cultures and sputum cultures were obtained.  She underwent a CT of the chest which showed a right large sided pleural effusion with right middle lobe and right lower lobe consolidation as well as residual ascites in the abdomen following paracentesis.  She had cirrhotic changes  of the liver that were seen.  She is admitted for severe sepsis given that her lactic acid was level greater than 2 and that she had new oxygen requirement of 4 L.  She was placed on antibiotics azithromycin and ceftriaxone.  She is found to have 1 out of 4 bottles of E. coli bacteremia with possible pneumonia and she is continued on ceftriaxone and azithromycin.  IR was also consulted and she underwent a 1.8 L thoracentesis and there was question removed with there this was a hepatic hydrothorax.  She is continue on ceftriaxone and azithromycin and she did have large ascites that is refractory to high-dose diuretics.  GI evaluated and recommended resuming her home diuretic doses of Lasix 40 mg twice daily spironolactone 20 mg daily follow-up for a low sodium diet.  Patient was stressed to get a TIPS however this was canceled in the setting of her bacteremia ? ?GI has initiated her back on her home diuretics but if her renal function is okay there we will try increasing as she tolerates tomorrow.  They are recommending a discharge that she stay on Lasix 40 mg twice daily and spironolactone 200 g daily but they may increase it while she is in house.  Repeat chest x-ray yesterday showed a moderate pleural effusion again and today CXR showed "Large right pleural effusion, increased in size since the study from 1 day prior with worsened aeration of the right lung." ? ?Repeat CXR showed worsening  large right pleural effusion and because of her shortness of breath while ambulation a repeat thoracentesis was ordered.  GI recommending continuing diuretics and may increase the dose and recommending continuing low-sodium diet. ? ?She is s/p Thoracentesis again and 1 Liter was removed on 10/25/21.  GI has increased her diuretics to 40 mg of Lasix 3 times daily and 300 mg spironolactone daily however yesterday when I  walked in the room she was eating a large bag of Lays potato chips so I advised her about her salt  intake. ? ?Today she was actively vomiting and retching and did not feel as good.   ? ?Assessment and Plan: ? ?Severe sepsis in the setting of E. coli bacteremia with concomitant pneumonia ?-Had a new O2 requirement and was found to have a very large consolidation and pleural effusion which was tapped and revealed 1.9  ?-Blood cultures grew 1 out of 4 E. coli and she is currently on IV ceftriaxone and azithromycin which we will continue ?-Source of infection is likely her pulmonary/intra-abdominal and she did meet severe sepsis criteria given that she had endorgan damage with a lactic acid of 2.3 which then trended up to 3.8.  She did have a new O2 requirement and had acute respiratory failure with hypoxia ?-Continue antibiotics and continue evaluation.  She is given IV fluid hydration however now will discontinue given that she is going to be aggressively diuresed ?-C/w Abx of Ceftriaxone and Azithromycin; Will continue Azithro for 5 days and extend and de-escalate CTX to Cefazolin eventually  ?-Patient had a community-acquired pneumonia as she presented with productive cough, fever, hypoxia and infiltrate in the right lower lobe chest x-ray consistent with pneumonia.  We will check a strep pneumo urine antigen and urine Legionella antigen ?-Procalcitonin level was elevated 3.38 and she had an elevated lactic Level and it trended up from 2.3 -> 3.8 ?-Repeat CXR this AM showed "Moderate-large layering right-sided pleural effusion with hazy airspace opacity in the right mid to lower lung. Appearance is similar to the previous exam." ?-PT/OT recommending Home Health  ?  ?Acute Respiratory Failure with Hypoxia in the setting of pleural effusion and community pneumonia ?-Continue with treatment as above with antibiotics and diuretics  ?-Continuous pulse oximetry maintain O2 saturation greater than 90% ?-Continue supplemental oxygen via nasal cannula wean O2 as tolerated ?-SpO2: 96 % ?O2 Flow Rate (L/min): 2  L/min ?-Initiated Xopenex/Atrovent q6hprn ?-Flutter Valve, Incentive Spirometry, and Guaifenesin 1200 mg po BID  ?-Repeated Thoracentesis the day before yesterday and has had 2 this Admission  ?-Repeat chest x-ray in the a.m. and she will need an ambulatory home O2 screen prior to discharge ?  ?Decompensated Alcoholic cirrhosis of the liver with ascites that is refractory to Diuretics ?Hyperbilirubinemia ?-Her MELD NA score was 15 and she is followed by GI ?-She is possibly having a TIPS procedure by IR but GIs been consulted and this will be placed on hold given her new bacteremia ?-GI is following and she has had serial large-volume paracentesis at least twice a month.  Her last paracentesis yesterday yielded 9.8 L.  Current nucleated cell count was 294 with 25% neutrophils but she did not meet criteria for SBP.  She continues to have right-sided tenderness on examination. ?-Patient's T. bili went from 2.3 is now 1.8 -> 1.2 x2 -> 0.7 x3 ?-GI is recommending resuming her home diuretics of 40 mg of Lasix twice daily and spironolactone 200 mg daily allowing for low-sodium diet but now GI is  wanting to increase her diuretics to 300 mg of spironolactone daily and 40 mg of Lasix 3 times daily; continue to follow renal function closely; She is -1.610 Liters since admission ?-BUN/Cr went from 9/0.75 -> 8/0.82 ?-She did receive 1 dose of IV Lasix 40 milligrams in the ED ?-Patient was to get a TIPS this was electively scheduled however this is now canceled given that the timing of the future this needs to be per IR recommendations given the clearance of her bacteremia; given recurrent bacteremia and acute illness there is no urgent need for TIPS procedure and IR recommending once she is fully recovered can consider rescheduling the procedure ?-Feels that they think that TIPS is the best option for her refractory ascites and effusion given her failure with diuretics as an outpatient but until then we will continue current  diuretics and they may further titrate in the next day or so ?  ?Esophageal Varices ?-Gastroenterology following ?-Currently not on Propanolol ?  ?Tobacco abuse and dependence ?-Smoking cessation counseling given ?-Nicotine patc

## 2021-10-28 DIAGNOSIS — J90 Pleural effusion, not elsewhere classified: Secondary | ICD-10-CM | POA: Diagnosis not present

## 2021-10-28 DIAGNOSIS — J189 Pneumonia, unspecified organism: Secondary | ICD-10-CM | POA: Diagnosis not present

## 2021-10-28 DIAGNOSIS — I8500 Esophageal varices without bleeding: Secondary | ICD-10-CM | POA: Diagnosis not present

## 2021-10-28 DIAGNOSIS — A419 Sepsis, unspecified organism: Secondary | ICD-10-CM | POA: Diagnosis not present

## 2021-10-28 DIAGNOSIS — K7031 Alcoholic cirrhosis of liver with ascites: Secondary | ICD-10-CM | POA: Diagnosis not present

## 2021-10-28 LAB — CULTURE, BODY FLUID W GRAM STAIN -BOTTLE: Culture: NO GROWTH

## 2021-10-28 LAB — CBC WITH DIFFERENTIAL/PLATELET
Abs Immature Granulocytes: 0.03 10*3/uL (ref 0.00–0.07)
Basophils Absolute: 0 10*3/uL (ref 0.0–0.1)
Basophils Relative: 1 %
Eosinophils Absolute: 0.4 10*3/uL (ref 0.0–0.5)
Eosinophils Relative: 5 %
HCT: 29.2 % — ABNORMAL LOW (ref 36.0–46.0)
Hemoglobin: 9.3 g/dL — ABNORMAL LOW (ref 12.0–15.0)
Immature Granulocytes: 0 %
Lymphocytes Relative: 21 %
Lymphs Abs: 1.5 10*3/uL (ref 0.7–4.0)
MCH: 23.3 pg — ABNORMAL LOW (ref 26.0–34.0)
MCHC: 31.8 g/dL (ref 30.0–36.0)
MCV: 73 fL — ABNORMAL LOW (ref 80.0–100.0)
Monocytes Absolute: 1.1 10*3/uL — ABNORMAL HIGH (ref 0.1–1.0)
Monocytes Relative: 15 %
Neutro Abs: 4.2 10*3/uL (ref 1.7–7.7)
Neutrophils Relative %: 58 %
Platelets: 145 10*3/uL — ABNORMAL LOW (ref 150–400)
RBC: 4 MIL/uL (ref 3.87–5.11)
RDW: 23.6 % — ABNORMAL HIGH (ref 11.5–15.5)
WBC: 7.2 10*3/uL (ref 4.0–10.5)
nRBC: 0 % (ref 0.0–0.2)

## 2021-10-28 LAB — COMPREHENSIVE METABOLIC PANEL
ALT: 21 U/L (ref 0–44)
AST: 32 U/L (ref 15–41)
Albumin: 2.4 g/dL — ABNORMAL LOW (ref 3.5–5.0)
Alkaline Phosphatase: 74 U/L (ref 38–126)
Anion gap: 6 (ref 5–15)
BUN: 9 mg/dL (ref 6–20)
CO2: 26 mmol/L (ref 22–32)
Calcium: 8.7 mg/dL — ABNORMAL LOW (ref 8.9–10.3)
Chloride: 104 mmol/L (ref 98–111)
Creatinine, Ser: 0.9 mg/dL (ref 0.44–1.00)
GFR, Estimated: >60 mL/min (ref >60)
Glucose, Bld: 104 mg/dL — ABNORMAL HIGH (ref 70–99)
Potassium: 4.1 mmol/L (ref 3.5–5.1)
Sodium: 136 mmol/L (ref 135–145)
Total Bilirubin: 1 mg/dL (ref 0.3–1.2)
Total Protein: 5.5 g/dL — ABNORMAL LOW (ref 6.5–8.1)

## 2021-10-28 LAB — MAGNESIUM: Magnesium: 1.8 mg/dL (ref 1.7–2.4)

## 2021-10-28 LAB — MISC LABCORP TEST (SEND OUT)

## 2021-10-28 LAB — PHOSPHORUS: Phosphorus: 4.1 mg/dL (ref 2.5–4.6)

## 2021-10-28 MED ORDER — CEFAZOLIN SODIUM-DEXTROSE 2-4 GM/100ML-% IV SOLN
2.0000 g | Freq: Three times a day (TID) | INTRAVENOUS | Status: AC
  Administered 2021-10-28 – 2021-10-29 (×5): 2 g via INTRAVENOUS
  Filled 2021-10-28 (×4): qty 100

## 2021-10-28 MED ORDER — VITAMIN B-6 25 MG PO TABS
50.0000 mg | ORAL_TABLET | Freq: Every day | ORAL | Status: DC
  Administered 2021-10-28 – 2021-11-08 (×11): 50 mg via ORAL
  Filled 2021-10-28 (×11): qty 2

## 2021-10-28 MED ORDER — MAGNESIUM SULFATE 2 GM/50ML IV SOLN
2.0000 g | Freq: Once | INTRAVENOUS | Status: AC
  Administered 2021-10-28: 2 g via INTRAVENOUS
  Filled 2021-10-28: qty 50

## 2021-10-28 NOTE — Progress Notes (Signed)
Occupational Therapy Treatment ?Patient Details ?Name: Katie Holland ?MRN: [id number] ?DOB: 03-29-1966 ?Today's Date: 10/28/2021 ? ? ?History of present illness 56 y/o female presented to ED on 10/22/21 for SOB and chest tightness after she received paracentesis earlier that day. Admitted for sepsis 2/2 E. coli bacteremia and CAP. PMH: ETOH cirrhosis ?  ?OT comments ? Pt ambulates with min guard assist pushing IV pole, reports she typically walks with a cane. Completes ADLs with set up to min guard assist. Sp02 97% on 2L 02. Pt reports feeling better with use of 02. Agrees she will benefit from seated showering for safety and energy conservation.  ? ?Recommendations for follow up therapy are one component of a multi-disciplinary discharge planning process, led by the attending physician.  Recommendations may be updated based on patient status, additional functional criteria and insurance authorization. ?   ?Follow Up Recommendations ? Home health OT  ?  ?Assistance Recommended at Discharge Set up Supervision/Assistance  ?Patient can return home with the following ? A little help with walking and/or transfers;A little help with bathing/dressing/bathroom ?  ?Equipment Recommendations ? Tub/shower bench  ?  ?Recommendations for Other Services   ? ?  ?Precautions / Restrictions Precautions ?Precautions: Fall ?Restrictions ?Weight Bearing Restrictions: No  ? ? ?  ? ?Mobility Bed Mobility ?Overal bed mobility: Modified Independent ?  ?  ?  ?  ?  ?  ?  ?  ? ?Transfers ?Overall transfer level: Needs assistance ?Equipment used:  (pushed IV pole) ?Transfers: Sit to/from Stand ?Sit to Stand: Min guard ?  ?  ?  ?  ?  ?  ?  ?  ?Balance Overall balance assessment: Needs assistance ?  ?Sitting balance-Leahy Scale: Good ?  ?  ?  ?Standing balance-Leahy Scale: Poor ?Standing balance comment: reliant on IV pole for support ?  ?  ?  ?  ?  ?  ?  ?  ?  ?  ?  ?   ? ?ADL either performed or assessed with clinical judgement  ? ?ADL Overall ADL's :  Needs assistance/impaired ?  ?  ?Grooming: Wash/dry hands;Standing;Min guard ?  ?  ?  ?  ?  ?Upper Body Dressing : Set up;Sitting ?  ?Lower Body Dressing: Set up;Sitting/lateral leans ?  ?Toilet Transfer: Ambulation;Min guard ?  ?Toileting- Clothing Manipulation and Hygiene: Sit to/from stand;Min guard ?  ?  ?  ?Functional mobility during ADLs: Min guard (pushed IV pole) ?General ADL Comments: pt agrees she needs a shower seat ?  ? ?Extremity/Trunk Assessment   ?  ?  ?  ?  ?  ? ?Vision   ?  ?  ?Perception   ?  ?Praxis   ?  ? ?Cognition Arousal/Alertness: Awake/alert ?Behavior During Therapy: [facility] for tasks assessed/performed ?Overall Cognitive Status: Within Functional Limits for tasks assessed ?  ?  ?  ?  ?  ?  ?  ?  ?  ?  ?  ?  ?  ?  ?  ?  ?  ?  ?  ?   ?Exercises   ? ?  ?Shoulder Instructions   ? ? ?  ?General Comments    ? ? ?Pertinent Vitals/ Pain       Pain Assessment ?Pain Assessment: No/denies pain ? ?Home Living   ?  ?  ?  ?  ?  ?  ?  ?  ?  ?  ?  ?  ?  ?  ?  ?  ?  ?  ? ?  ?  Prior Functioning/Environment    ?  ?  ?  ?   ? ?Frequency ? Min 2X/week  ? ? ? ? ?  ?Progress Toward Goals ? ?OT Goals(current goals can now be found in the care plan section) ? Progress towards OT goals: Progressing toward goals ? ?Acute Rehab OT Goals ?OT Goal Formulation: With patient ?Time For Goal Achievement: 11/07/21 ?Potential to Achieve Goals: Good  ?Plan Discharge plan remains appropriate   ? ?Co-evaluation ? ? ?   ?  ?  ?  ?  ? ?  ?AM-PAC OT "6 Clicks" Daily Activity     ?Outcome Measure ? ? Help from another person eating meals?: None ?Help from another person taking care of personal grooming?: A Little ?Help from another person toileting, which includes using toliet, bedpan, or urinal?: A Little ?Help from another person bathing (including washing, rinsing, drying)?: A Little ?Help from another person to put on and taking off regular upper body clothing?: None ?Help from another person to put on and taking off regular lower  body clothing?: A Little ?6 Click Score: 20 ? ?  ?End of Session   ? ?OT Visit Diagnosis: Unsteadiness on feet (R26.81);Other abnormalities of gait and mobility (R26.89);Muscle weakness (generalized) (M62.81) ?  ?Activity Tolerance Patient tolerated treatment well ?  ?Patient Left in bed;with call bell/phone within reach;with bed alarm set ?  ?Nurse Communication   ?  ? ?   ? ?Time: 4166-0630 ?OT Time Calculation (min): 16 min ? ?Charges: OT General Charges ?$OT Visit: 1 Visit ?OT Treatments ?$Self Care/Home Management : 8-22 mins ? ?Nestor Lewandowsky, OTR/L ?Acute Rehabilitation Services ?Pager: [phone] ?Office: [phone]  ? ?Malka So ?10/28/2021, 12:47 PM ?

## 2021-10-28 NOTE — Progress Notes (Addendum)
? ?       Daily Rounding Note ? ?10/28/2021, 10:36 AM ? LOS: 6 days  ? ?SUBJECTIVE:   ?Chief complaint:   Decompensated alcoholic cirrhosis, ascites, hepatic hydrothorax.  E. coli bacteremia. ? ?Patient feeling well.  She was walking in the hallway yesterday without issues.  Tolerating solid food.  1 BM yesterday, 1 this morning.  Brown color. ? ?She denies chest pain or cough ? ?OBJECTIVE:        ? Vital signs in last 24 hours:    ?Temp:  [97.8 ?F (36.6 ?C)-99 ?F (37.2 ?C)] 97.8 ?F (36.6 ?C) (05/08 0086) ?Pulse Rate:  [65-106] 89 (05/08 0826) ?Resp:  [17-21] 17 (05/08 0826) ?BP: (89-121)/(52-75) 121/75 (05/08 7619) ?SpO2:  [88 %-97 %] 96 % (05/08 0826) ?Last BM Date : 10/27/21 ?Filed Weights  ? 10/24/21 1520  ?Weight: 57.5 kg  ? ?General: Thin, looks somewhat chronically ill.  Comfortable, alert ?Heart: RRR. ?Chest: Diminished breath sounds at right base though overall sounds they are improved compared with 3 days ago. ?Abdomen: Soft without tenderness.  Slight distention.  Active bowel sounds.  No fluid wave, ascites. ?Extremities: No CCE. ?Neuro/Psych: Oriented x3.  Cooperative.  Fluid speech.  Moves all 4 limbs.  No asterixis or tremors. ? ?Intake/Output from previous day: ?05/07 0701 - 05/08 0700 ?In: -  ?Out: 1200 [Urine:1150; Emesis/NG output:50] ? ?Intake/Output this shift: ?No intake/output data recorded. ? ?Lab Results: ?Recent Labs  ?  10/26/21 ?0133 10/27/21 ?0110 10/28/21 ?5093  ?WBC 5.7 6.5 7.2  ?HGB 9.2* 8.8* 9.3*  ?HCT 28.0* 26.7* 29.2*  ?PLT 144* 143* 145*  ? ?BMET ?Recent Labs  ?  10/26/21 ?0133 10/27/21 ?0110 10/28/21 ?2671  ?NA 136 136 136  ?K 3.7 3.7 4.1  ?CL 108 107 104  ?CO2 '24 23 26  '$ ?GLUCOSE 97 116* 104*  ?BUN '9 8 9  '$ ?CREATININE 0.75 0.82 0.90  ?CALCIUM 8.6* 8.4* 8.7*  ? ?LFT ?Recent Labs  ?  10/26/21 ?0133 10/27/21 ?0110 10/28/21 ?2458  ?PROT 5.1* 5.0* 5.5*  ?ALBUMIN 2.4* 2.3* 2.4*  ?AST 25 27 32  ?ALT '16 15 21  '$ ?ALKPHOS 83 79 74  ?BILITOT  0.7 0.7 1.0  ? ?PT/INR ?No results for input(s): LABPROT, INR in the last 72 hours. ?Hepatitis Panel ?No results for input(s): HEPBSAG, HCVAB, HEPAIGM, HEPBIGM in the last 72 hours. ? ?Studies/Results: ?DG CHEST PORT 1 VIEW ? ?Result Date: 10/27/2021 ?CLINICAL DATA:  Pleural effusion EXAM: PORTABLE CHEST 1 VIEW COMPARISON:  10/26/2021 FINDINGS: Heart size appears normal. Moderate-large layering right-sided pleural effusion with hazy airspace opacity in the right mid to lower lung. Left lung remains clear. No pneumothorax. IMPRESSION: Moderate-large layering right-sided pleural effusion with hazy airspace opacity in the right mid to lower lung. Appearance is similar to the previous exam. Electronically Signed   By: Davina Poke D.O.   On: 10/27/2021 12:10   ? ?Scheduled Meds: ? cholecalciferol  1,000 Units Oral Daily  ? furosemide  40 mg Oral TID  ? guaiFENesin  1,200 mg Oral BID  ? lactose free nutrition  237 mL Oral BID BM  ? lactulose  20 g Oral BID  ? multivitamin with minerals  1 tablet Oral Daily  ? nicotine  7 mg Transdermal Daily  ? sodium chloride flush  3 mL Intravenous Q12H  ? spironolactone  300 mg Oral Daily  ? vitamin B-12  1,000 mcg Oral Daily  ? zinc sulfate  220 mg Oral Daily  ? ?Continuous Infusions: ?  sodium chloride    ? cefTRIAXone (ROCEPHIN)  IV 2 g (10/27/21 1709)  ? ?PRN Meds:.sodium chloride, fentaNYL (SUBLIMAZE) injection, ipratropium, levalbuterol, lidocaine (PF), ondansetron (ZOFRAN) IV, prochlorperazine, sodium chloride flush, traMADol ? ?ASSESMENT:  ? ?Decompensated alcoholic cirrhosis.  Cut back on ETOH consumption but still drinks beer occasionally ? ?Refractory ascites with questionable compliance with diuretics.  Frequent, twice monthly, paracentesis.  Planned TIPS on hold due to bacteremia.  Current diuretics are furosemide 40 po tid, spironolactone 300 mg/daily.  She is tolerating these well with no signs of renal insufficiency.  Not compliant w dietary Na restrictions.    ? ?Enterobacter and E. coli bacteremia on pathogen screening.  Grew E. coli on blood culture.  Pneumonia.   ? ?New pleural effusion, 5/3 and 5/5 thoracentesis. ? ?Microcytic anemia.  Iron 66, ferritin 22.  Low TIBC 178.  Iron sats elevated 37%.  B12 low.  Folate normal.  Receiving daily oral B12 as of this admission. ? ?History esophageal varices.  Several EGDs with banding starting 2019.  Latest EGD 08/22/2021 showing trace, distal esophageal varices, no high risk skin stigmata and continued scarring from prior banding, large gastric food residue limited exam of stomach and duodenum. ? ? ?PLAN  ? ?  ? When is expected date of TIPS.  Can we reach out to IR for details?  ? ? ? ?Azucena Freed  10/28/2021, 10:36 AM ?Phone [phone]  ? ?I have taken an interval history, thoroughly reviewed the chart and examined the patient. I agree with the Advanced Practitioner's note, impression and recommendations, and have recorded additional findings, impressions and recommendations below. ?I performed a substantive portion of this encounter (>50% time spent), including a complete performance of the medical decision making. ? ?My additional thoughts are as follows: ? ?Decompensated cirrhosis with refractory ascites and hepatic hydrothorax.  Signout received from Dr. Havery Moros and chart review performed. ? ?She is alert and conversational today with no signs of encephalopathy.  Breathing comfortably after thoracenteses on May 3 and May 5.  Last paracentesis May 2. ? ?Abdomen protuberant with ascites and normal bowel sounds, soft and nontender. ? ?Patient reportedly has history of dietary and medication noncompliance.  Diuretic doses have been steadily escalated during this hospital stay and renal function stable so far, ?However, I will hold these doses until we see what her renal function is tomorrow and also consult IR about the timing of TIPS. ?I discussed the case with Dr. Alfredia Ferguson of [facility], who is planning a 7-day course of  antibiotics for this patient. ? ? ?35 minutes were spent on this encounter (including chart review, history/exam, counseling/coordination of care, and documentation) > 50% of that time was spent on counseling and coordination of care. ?Complex medical conditions, extensive chart review required. ?Nelida Meuse III ?[phone] ? ?

## 2021-10-28 NOTE — Progress Notes (Signed)
?PROGRESS NOTE ? ? ? ?Katie Holland  BJY:[id number] DOB: 11/11/1965 DOA: 10/22/2021 ?PCP: Billie Ruddy, MD  ? ?Brief Narrative:  ?The patient is a 56 year old African-American female with past medical history significant for but not limited to alcoholic liver failure, history of alcohol abuse, history of tobacco abuse, history of esophageal varices, history of upper GI bleed as well as other comorbidities who presented with worsening shortness of breath and increased O2 requirement.  She underwent an ultrasound-guided therapeutic paracentesis that yielded about 9.8 L of clear yellow fluid and had tolerated well and was administered 75 g of albumin.  Subsequently she is noted to be short of breath and had an elevated heart rate and high blood pressure even after paracentesis.  She is noted to be saturating 89% and increased work of bleeding and to be placed on 4 L of supplemental oxygen which improved her O2 saturations 96%.  There is attempt to wean her from her 4 L but because she failed to do so she was sent the ED for further evaluation.  She is noted to be somewhat more lethargic than usual and she was on home Lasix and spironolactone.  She also had some abdominal pain and does not drink anymore and her last drink was 3 months ago.  She continues to smoke cigarettes 6 to 7 cigarettes a day and think about quitting.  She had no bleeding and her last thoracentesis was 9 months ago she stated.  While in the ED she is noted to have a lactic acid of 2.3 and a chest x-ray that was worrisome for her right middle right lower lobe large right pleural effusion but also could be pneumonia versus CHF.  GI was notified of patient's admission and blood cultures and sputum cultures were obtained.  She underwent a CT of the chest which showed a right large sided pleural effusion with right middle lobe and right lower lobe consolidation as well as residual ascites in the abdomen following paracentesis.  She had cirrhotic changes  of the liver that were seen.  She is admitted for severe sepsis given that her lactic acid was level greater than 2 and that she had new oxygen requirement of 4 L.  She was placed on antibiotics azithromycin and ceftriaxone.  She is found to have 1 out of 4 bottles of E. coli bacteremia with possible pneumonia and she is continued on ceftriaxone and azithromycin.  IR was also consulted and she underwent a 1.8 L thoracentesis and there was question removed with there this was a hepatic hydrothorax.  She is continue on ceftriaxone and azithromycin and she did have large ascites that is refractory to high-dose diuretics.  GI evaluated and recommended resuming her home diuretic doses of Lasix 40 mg twice daily spironolactone 20 mg daily follow-up for a low sodium diet.  Patient was stressed to get a TIPS however this was canceled in the setting of her bacteremia ? ?GI has initiated her back on her home diuretics but if her renal function is okay there we will try increasing as she tolerates tomorrow.  They are recommending a discharge that she stay on Lasix 40 mg twice daily and spironolactone 200 g daily but they may increase it while she is in house.  Repeat chest x-ray yesterday showed a moderate pleural effusion again and today CXR showed "Large right pleural effusion, increased in size since the study from 1 day prior with worsened aeration of the right lung." ? ?Repeat CXR showed worsening  large right pleural effusion and because of her shortness of breath while ambulation a repeat thoracentesis was ordered.  GI recommending continuing diuretics and may increase the dose and recommending continuing low-sodium diet. ? ?She is s/p Thoracentesis again and 1 Liter was removed on 10/25/21.  GI has increased her diuretics to 40 mg of Lasix 3 times daily and 300 mg spironolactone daily however yesterday when I  walked in the room she was eating a large bag of L[employer] potato chips so I advised her about her salt  intake. ? ?10/27/21 she was actively vomiting and retching and did not feel as good. She is improved 10/28/21 but back on Supplemental O2 and complains of slight SOB.  GI will reach out to interventional radiology about timing of the TIPS and will continue antibiotics for 7 days for her E. coli bacteremia.  GI recommending continuing current dose of diuretics and not escalating dose yet  ? ?Assessment and Plan: ? ?Severe sepsis in the setting of E. coli bacteremia with concomitant pneumonia ?-Had a new O2 requirement and was found to have a very large consolidation and pleural effusion which was tapped and revealed 1.9  ?-Blood cultures grew 1 out of 4 E. coli and she is currently on IV ceftriaxone and azithromycin which we will continue ?-Source of infection is likely her pulmonary/intra-abdominal and she did meet severe sepsis criteria given that she had endorgan damage with a lactic acid of 2.3 which then trended up to 3.8.  She did have a new O2 requirement and had acute respiratory failure with hypoxia ?-Continue antibiotics and continue evaluation.  She is given IV fluid hydration however now will discontinue given that she is going to be aggressively diuresed ?-She completed CAP Coverage with IV Ceftriaxone and Azithromycin and will de-escalate to IV Cefazolin now ?-Patient had a community-acquired pneumonia as she presented with productive cough, fever, hypoxia and infiltrate in the right lower lobe chest x-ray consistent with pneumonia.  We will check a strep pneumo urine antigen and urine Legionella antigen ?-Procalcitonin level was elevated 3.38 and she had an elevated lactic Level and it trended up from 2.3 -> 3.8 ?-Repeat CXR this AM showed "Moderate-large layering right-sided pleural effusion with hazy airspace opacity in the right mid to lower lung. Appearance is similar to the previous exam." ?-PT/OT recommending Home Health  ?  ?Acute Respiratory Failure with Hypoxia in the setting of pleural effusion  and community pneumonia ?-Continue with treatment as above with antibiotics and diuretics  ?-Continuous pulse oximetry maintain O2 saturation greater than 90% ?-Continue supplemental oxygen via nasal cannula wean O2 as tolerated ?-SpO2: 97 % ?O2 Flow Rate (L/min): 2 L/min; Was off of O2 yesterday but back on it today  ?-Initiated Xopenex/Atrovent q6hprn ?-Flutter Valve, Incentive Spirometry, and Guaifenesin 1200 mg po BID  ?-S/p 2 Thoracentesis this Admission  ?-CXR done 5/7 showed "Moderate-large layering right-sided pleural effusion with hazy airspace opacity in the right mid to lower lung. Appearance is similar to the previous exam." ?-Will repeat CXR today  ?-Repeat chest x-ray in the a.m. and she will need an ambulatory home O2 screen prior to discharge ?  ?Decompensated Alcoholic cirrhosis of the liver with ascites that is refractory to Diuretics ?Hyperbilirubinemia ?-Her MELD NA score was 15 and she is followed by GI ?-She is possibly having a TIPS procedure by IR but GIs been consulted and this will be placed on hold given her new bacteremia ?-GI is following and she has had serial large-volume paracentesis  at least twice a month.  Her last paracentesis yesterday yielded 9.8 L.  Current nucleated cell count was 294 with 25% neutrophils but she did not meet criteria for SBP.  She continues to have right-sided tenderness on examination. ?-Patient's T. bili went from 2.3 is now 1.8 -> 1.2 x2 -> 0.7 x3 -> 1.0  ?-GI is recommending resuming her home diuretics of 40 mg of Lasix twice daily and spironolactone 200 mg daily allowing for low-sodium diet but now GI is wanting to increase her diuretics to 300 mg of spironolactone daily and 40 mg of Lasix 3 times daily; continue to follow renal function closely; She is -1.610 Liters since admission ?-BUN/Cr went from 9/0.75 -> 8/0.82 -> 9/0.90 ?-She did receive 1 dose of IV Lasix 40 milligrams in the ED ?-Patient was to get a TIPS this was electively scheduled however  this is now canceled given that the timing of the future this needs to be per IR recommendations given the clearance of her bacteremia; given recurrent bacteremia and acute illness there is no urgent need for TIPS pro

## 2021-10-29 ENCOUNTER — Inpatient Hospital Stay (HOSPITAL_COMMUNITY): Payer: Medicare HMO

## 2021-10-29 DIAGNOSIS — R6520 Severe sepsis without septic shock: Secondary | ICD-10-CM | POA: Diagnosis not present

## 2021-10-29 DIAGNOSIS — K7031 Alcoholic cirrhosis of liver with ascites: Secondary | ICD-10-CM | POA: Diagnosis not present

## 2021-10-29 DIAGNOSIS — J90 Pleural effusion, not elsewhere classified: Secondary | ICD-10-CM | POA: Diagnosis not present

## 2021-10-29 DIAGNOSIS — J189 Pneumonia, unspecified organism: Secondary | ICD-10-CM | POA: Diagnosis not present

## 2021-10-29 DIAGNOSIS — J9601 Acute respiratory failure with hypoxia: Secondary | ICD-10-CM | POA: Diagnosis not present

## 2021-10-29 DIAGNOSIS — A419 Sepsis, unspecified organism: Secondary | ICD-10-CM | POA: Diagnosis not present

## 2021-10-29 LAB — COMPREHENSIVE METABOLIC PANEL
ALT: 20 U/L (ref 0–44)
AST: 29 U/L (ref 15–41)
Albumin: 2.3 g/dL — ABNORMAL LOW (ref 3.5–5.0)
Alkaline Phosphatase: 88 U/L (ref 38–126)
Anion gap: 4 — ABNORMAL LOW (ref 5–15)
BUN: 10 mg/dL (ref 6–20)
CO2: 27 mmol/L (ref 22–32)
Calcium: 8.6 mg/dL — ABNORMAL LOW (ref 8.9–10.3)
Chloride: 103 mmol/L (ref 98–111)
Creatinine, Ser: 0.93 mg/dL (ref 0.44–1.00)
GFR, Estimated: >60 mL/min (ref >60)
Glucose, Bld: 94 mg/dL (ref 70–99)
Potassium: 4.2 mmol/L (ref 3.5–5.1)
Sodium: 134 mmol/L — ABNORMAL LOW (ref 135–145)
Total Bilirubin: 0.7 mg/dL (ref 0.3–1.2)
Total Protein: 5.2 g/dL — ABNORMAL LOW (ref 6.5–8.1)

## 2021-10-29 LAB — CBC WITH DIFFERENTIAL/PLATELET
Abs Immature Granulocytes: 0.04 10*3/uL (ref 0.00–0.07)
Basophils Absolute: 0 10*3/uL (ref 0.0–0.1)
Basophils Relative: 1 %
Eosinophils Absolute: 0.5 10*3/uL (ref 0.0–0.5)
Eosinophils Relative: 7 %
HCT: 25.9 % — ABNORMAL LOW (ref 36.0–46.0)
Hemoglobin: 8.6 g/dL — ABNORMAL LOW (ref 12.0–15.0)
Immature Granulocytes: 1 %
Lymphocytes Relative: 25 %
Lymphs Abs: 1.6 10*3/uL (ref 0.7–4.0)
MCH: 23.8 pg — ABNORMAL LOW (ref 26.0–34.0)
MCHC: 33.2 g/dL (ref 30.0–36.0)
MCV: 71.7 fL — ABNORMAL LOW (ref 80.0–100.0)
Monocytes Absolute: 1 10*3/uL (ref 0.1–1.0)
Monocytes Relative: 15 %
Neutro Abs: 3.3 10*3/uL (ref 1.7–7.7)
Neutrophils Relative %: 51 %
Platelets: 145 10*3/uL — ABNORMAL LOW (ref 150–400)
RBC: 3.61 MIL/uL — ABNORMAL LOW (ref 3.87–5.11)
RDW: 23.5 % — ABNORMAL HIGH (ref 11.5–15.5)
WBC: 6.4 10*3/uL (ref 4.0–10.5)
nRBC: 0 % (ref 0.0–0.2)

## 2021-10-29 LAB — PHOSPHORUS: Phosphorus: 4.1 mg/dL (ref 2.5–4.6)

## 2021-10-29 LAB — VITAMIN B1: Vitamin B1 (Thiamine): 124.4 nmol/L (ref 66.5–200.0)

## 2021-10-29 LAB — MAGNESIUM: Magnesium: 1.7 mg/dL (ref 1.7–2.4)

## 2021-10-29 LAB — TRIGLYCERIDES, BODY FLUIDS: Triglycerides, Fluid: <9 mg/dL

## 2021-10-29 MED ORDER — MAGNESIUM SULFATE 2 GM/50ML IV SOLN
2.0000 g | Freq: Once | INTRAVENOUS | Status: AC
  Administered 2021-10-29: 2 g via INTRAVENOUS
  Filled 2021-10-29: qty 50

## 2021-10-29 NOTE — Progress Notes (Signed)
?  Patient known to IR service. ? ?She was evaluated by Dr. Vernard Gambles on 10/11/21 and was scheduled for elective TIPS but has been postponed secondary to admission for severe sepsis. ? ?Her lactic acid was level greater than 2 and she had new oxygen requirement of 4 L.   ? ?She was placed on antibiotics azithromycin and ceftriaxone.   ? ?She was found to have 1 out of 4 bottles of E. coli bacteremia drawn on 10/22/21. ? ?Chart reviewed by Dr. Vernard Gambles.  ? ?Labs ok. Once she is recovered from sepsis and blood cultures are negative, can reschedule elective outpatient TIPS. ? ?Murrell Redden PA-C ?10/29/2021 ?11:02 AM ? ? ? ? ?

## 2021-10-29 NOTE — Consult Note (Signed)
? ?NAME:  Katie Holland, MRN:  [id number], DOB:  01/01/1966, LOS: 7 ?ADMISSION DATE:  10/22/2021, CONSULTATION DATE: 10/29/2021 ?REFERRING MD: [facility], CHIEF COMPLAINT: Recurrent right pleural effusion ? ?History of Present Illness:  ?56 year old female with end-stage liver disease poorly compliant with treatment and has had 25 paracentesis in for thoracentesis over the last year.  Note her last paracentesis 10/22/2021 with 9.8 L of fluid obtained.  She also had a thoracentesis on 5 3 with 1.9 L and 5 5 with 1 L.  Notably her right pleural effusion has returned pulmonary critical care is asked to evaluate.  She has been evaluated for TIPS procedure in the near future.  She has had multiple interventional radiology procedures for banding in the past.  She is in no respiratory distress at this time.  She is very comfortable on 2 L nasal cannula.  She denies pain shortness of breath.  She is on a nicotine patch and  reported to smoke occasionally.  Her past medical history is well-documented below.  She be evaluated by pulmonary MD for questionable interventions. ? ?Pertinent  Medical History  ? ?Past Medical History:  ?Diagnosis Date  ? Alcoholic liver disease ([place])   ? Chronic back pain   ? on disability  ? Cirrhosis of liver ([place])   ? Esophageal varices (H[facility])   ? GERD (gastroesophageal reflux disease)   ? Headache   ? Jaundice   ? ? ? ?Significant Hospital Events: ?Including procedures, antibiotic start and stop dates in addition to other pertinent events   ?See interventional radiology for list of procedures ?Interim History / Subjective:  ?56 year old female with recurrent right pleural effusion in setting of end-stage liver disease ? ?Objective   ?Blood pressure 94/61, pulse 84, temperature 97.7 ?F (36.5 ?C), temperature source Axillary, resp. rate 20, weight 57.5 kg, SpO2 95 %. ?   ?   ? ?Intake/Output Summary (Last 24 hours) at 10/29/2021 0919 ?Last data filed at 10/29/2021 0539 ?Gross per 24 hour  ?Intake 68.7 ml  ?Output  450 ml  ?Net -381.3 ml  ? ?Filed Weights  ? 10/24/21 1520  ?Weight: 57.5 kg  ? ? ?Examination: ?General: Cachectic female no acute distress ?HENT: No JVD ?Lungs: Diminished on the right ?Cardiovascular: Heart sounds are regular ?Abdomen: Positive ascites with fluid wave ?Extremities: Warm to touch ?Neuro: Grossly intact without focal defect ?GU: Voids ? ?Resolved Hospital Problem list   ? ? ?Assessment & Plan:  ?Request for pulmonary consult in the setting of recurrent right pleural effusion in a patient with poorly controlled cirrhosis with multiple paracentesis.  Not currently in any respiratory distress. ?Aggressive diuresis as tolerated ?Paracentesis as needed ?She is to have a TIPS procedure performed in the near future ?Thoracentesis at this time would make little difference that she will replace the fluid almost immediately. ?MD to evaluate for possible repeat right thoracentesis. ? ?Status post pneumonia treated with appropriate antimicrobial therapy and respiratory distress at this time. ?Continue to monitor ? ?Esophageal varices followed by GI services ? ?Tobacco abuse ?Consider DC nicotine patch 7 mg to give her a chance to be nicotine free. ? ?Lactic acidosis last lactic acid was on 10/22/2021 was 3.8 ?Considering rechecking lactic acid ? ?Severe malnutrition ?P.o. intake as tolerated ? ?Questionable end-stage liver disease if refractory attempts may consider changing goals of care from curative to comfort per primary. ? ?Best Practice (right click and "Reselect all SmartList Selections" daily)  ? ?Diet/type: Regular consistency (see orders) ?DVT prophylaxis: not indicated ?GI  prophylaxis: PPI ?Lines: N/A ?Foley:  N/A ?Code Status:  DNR ?Last date of multidisciplinary goals of care discussion [tbd] ? ?Labs   ?CBC: ?Recent Labs  ?Lab 10/25/21 ?0239 10/26/21 ?0133 10/27/21 ?0110 10/28/21 ?3419 10/29/21 ?0356  ?WBC 5.3 5.7 6.5 7.2 6.4  ?NEUTROABS 3.1 3.1 3.6 4.2 3.3  ?HGB 9.5* 9.2* 8.8* 9.3* 8.6*  ?HCT 28.8*  28.0* 26.7* 29.2* 25.9*  ?MCV 72.0* 71.2* 71.4* 73.0* 71.7*  ?PLT 142* 144* 143* 145* 145*  ? ? ?Basic Metabolic Panel: ?Recent Labs  ?Lab 10/25/21 ?0239 10/26/21 ?0133 10/27/21 ?0110 10/28/21 ?3790 10/29/21 ?0356  ?NA 138 136 136 136 134*  ?K 3.5 3.7 3.7 4.1 4.2  ?CL 109 108 107 104 103  ?CO2 '23 24 23 26 27  '$ ?GLUCOSE 105* 97 116* 104* 94  ?BUN '8 9 8 9 10  '$ ?CREATININE 0.74 0.75 0.82 0.90 0.93  ?CALCIUM 8.5* 8.6* 8.4* 8.7* 8.6*  ?MG 1.7 1.7 1.8 1.8 1.7  ?PHOS 3.3 3.2 2.7 4.1 4.1  ? ?GFR: ?Estimated Creatinine Clearance: 58.3 mL/min (by C-G formula based on SCr of 0.93 mg/dL). ?Recent Labs  ?Lab 10/22/21 ?1451 10/22/21 ?1628 10/22/21 ?2030 10/22/21 ?2242 10/23/21 ?0905 10/26/21 ?0133 10/27/21 ?0110 10/28/21 ?2409 10/29/21 ?0356  ?PROCALCITON  --   --   --  3.38  --   --   --   --   --   ?WBC 10.5  --   --   --    < > 5.7 6.5 7.2 6.4  ?LATICACIDVEN 2.3* 2.3* 2.7* 3.8*  --   --   --   --   --   ? < > = values in this interval not displayed.  ? ? ?Liver Function Tests: ?Recent Labs  ?Lab 10/25/21 ?0239 10/26/21 ?0133 10/27/21 ?0110 10/28/21 ?7353 10/29/21 ?0356  ?AST '26 25 27 '$ 32 29  ?ALT '17 16 15 21 20  '$ ?ALKPHOS 76 83 79 74 88  ?BILITOT 0.7 0.7 0.7 1.0 0.7  ?PROT 5.2* 5.1* 5.0* 5.5* 5.2*  ?ALBUMIN 2.4* 2.4* 2.3* 2.4* 2.3*  ? ?No results for input(s): LIPASE, AMYLASE in the last 168 hours. ?Recent Labs  ?Lab 10/22/21 ?2030 10/25/21 ?0239 10/26/21 ?1009 10/27/21 ?1041  ?AMMONIA 59* 70* 52* 47*  ? ? ?ABG ?   ?Component Value Date/Time  ? HCO3 25.7 10/22/2021 2122  ? TCO2 27 10/22/2021 2122  ? O2SAT 100 10/22/2021 2122  ?  ? ?Coagulation Profile: ?Recent Labs  ?Lab 10/22/21 ?1451 10/24/21 ?0750  ?INR 1.6* 1.4*  ? ? ?Cardiac Enzymes: ?Recent Labs  ?Lab 10/22/21 ?2242  ?CKTOTAL 79  ? ? ?HbA1C: ?No results found for: HGBA1C ? ?CBG: ?Recent Labs  ?Lab 10/24/21 ?0728 10/24/21 ?1211 10/24/21 ?1609 10/26/21 ?1218  ?GLUCAP 125* 112* 77 109*  ? ? ?Review of Systems:   ?10 point review of system taken, please see HPI for positives and  negatives. ? ? ?Past Medical History:  ?She,  has a past medical history of Alcoholic liver disease ([place]), Chronic back pain, Cirrhosis of liver ([place]), Esophageal varices ([place]), GERD (gastroesophageal reflux disease), Headache, and Jaundice.  ? ?Surgical History:  ? ?Past Surgical History:  ?Procedure Laterality Date  ? BACK SURGERY    ? L3-L4  ? BIOPSY  08/07/2020  ? Procedure: BIOPSY;  Surgeon: Yetta Flock, MD;  Location: [facility];  Service: Gastroenterology;;  ? COLONOSCOPY WITH PROPOFOL N/A 08/01/2019  ? Procedure: COLONOSCOPY WITH PROPOFOL;  Surgeon: Yetta Flock, MD;  Location: [facility] ENDOSCOPY;  Service: Gastroenterology;  Laterality: N/A;  ? ESOPHAGEAL BANDING  04/25/2018  ? Procedure: ESOPHAGEAL BANDING;  Surgeon: Yetta Flock, MD;  [facility] ENDOSCOPY;  Service: Gastroenterology;;  ? ESOPHAGEAL BANDING  04/27/2018  ? Procedure: ESOPHAGEAL BANDING;  Surgeon: Milus Banister, MD;  Location: [facility];  Service: Endoscopy;;  ? ESOPHAGEAL BANDING  06/02/2018  ? Procedure: ESOPHAGEAL BANDING;  Surgeon: Doran Stabler, MD;  Location: [place];  Service: Gastroenterology;;  ? ESOPHAGEAL BANDING N/A 07/07/2018  ? Procedure: ESOPHAGEAL BANDING;  Surgeon: Yetta Flock, MD;  Location: [facility];  Service: Gastroenterology;  Laterality: N/A;  ? ESOPHAGEAL BANDING  07/27/2018  ? Procedure: ESOPHAGEAL BANDING;  Surgeon: Yetta Flock, MD;  Location: [facility] ENDOS[facility];  Service: Gastroenterology;;  ? ESOPHAGEAL BANDING  08/30/2018  ? Procedure: ESOPHAGEAL BANDING;  Surgeon: Yetta Flock, MD;  Location: [facility] ENDOSCOPY;  Service: Gastroenterology;;  ? ESOPHAGOGASTRODUODENOSCOPY N/A 04/27/2018  ? Procedure: ESOPHAGOGASTRODUODENOSCOPY (EGD);  Surgeon: Milus Banister, MD;  Location: [facility] ENDOSCOPY;  Service: Endoscopy;  Laterality: N/A;  ? ESOPHAGOGASTRODUODENOSCOPY (EGD) WITH PROPOFOL N/A 04/25/2018  ? Procedure: ESOPHAGOGASTRODUODENOSCOPY (EGD) WITH PROPOFOL;  Surgeon:  Yetta Flock, MD;  Location: [place];  Service: Gastroenterology;  Laterality: N/A;  ? ESOPHAGOGASTRODUODENOSCOPY (EGD) WITH PROPOFOL N/A 06/02/2018  ? Procedure: ESOPHAGOGASTRODUODENOSCOPY (EGD)

## 2021-10-29 NOTE — Progress Notes (Addendum)
Physical Therapy Treatment ?Patient Details ?Name: Katie Holland ?MRN: [id number] ?DOB: February 18, 1966 ?Today's Date: 10/29/2021 ? ? ?History of Present Illness 56 y/o female presented to ED on 10/22/21 for SOB and chest tightness after she received paracentesis earlier that day. Admitted for sepsis 2/2 E. coli bacteremia and CAP. PMH: ETOH cirrhosis ? ?  ?PT Comments  ? ? Pt dozing on entry, however wakes easily and reports she knows she needs to get up and walk. Pt request trying her [facility] for ambulation, however has LOB on way to bathroom requiring minA for steadying. After standing and washing hands without outside support pt agrees she should use RW for hallway ambulation. Pt able to ambulate 150 feet with min guard assist and maintain SpO2 >92%O2. Pt reports feeling better after ambulation but states she is exhausted and would like to return to bed. RN in room upon return to hook up antibiotic IV. D/c plans remain appropriate at this time. PT will continue to follow acutely. ?   ?Recommendations for follow up therapy are one component of a multi-disciplinary discharge planning process, led by the attending physician.  Recommendations may be updated based on patient status, additional functional criteria and insurance authorization. ? ?Follow Up Recommendations ? Home health PT ?  ?  ?Assistance Recommended at Discharge Intermittent Supervision/Assistance  ?Patient can return home with the following A little help with walking and/or transfers;A little help with bathing/dressing/bathroom;Assistance with cooking/housework;Assist for transportation;Help with stairs or ramp for entrance ?  ?Equipment Recommendations ? None recommended by PT  ?  ?   ?Precautions / Restrictions Precautions ?Precautions: Fall ?Restrictions ?Weight Bearing Restrictions: No  ?  ? ?Mobility ? Bed Mobility ?Overal bed mobility: Modified Independent ?  ?  ?  ?  ?  ?  ?  ?  ? ?Transfers ?Overall transfer level: Needs assistance ?  ?Transfers: Sit to/from  Stand ?Sit to Stand: Min guard ?  ?  ?  ?  ?  ?General transfer comment: good power up from bed and toilet, min guard for steadying in standing for mild unsteadiness and c/o of slight dizziness ?  ? ?Ambulation/Gait ?Ambulation/Gait assistance: Min assist, Min guard ?Gait Distance (Feet): 150 Feet ?Assistive device: Rolling walker (2 wheels), Straight cane ?Gait Pattern/deviations: Step-through pattern, Decreased stride length ?Gait velocity: decreased ?Gait velocity interpretation: <1.8 ft/sec, indicate of risk for recurrent falls ?  ?General Gait Details: pt reports SPC use at baseline and would like to try, however has 1x LoB on way to bathroom requiring heavy min A for steadying, pt in agreement for use of RW for hallway ambulation, slow, steady gait with RW ? ? ?  ? ? ?  ?Balance Overall balance assessment: Needs assistance ?Sitting-balance support: No upper extremity supported, Feet supported ?Sitting balance-Leahy Scale: Good ?  ?  ?Standing balance support: Single extremity supported, No upper extremity supported, During functional activity ?Standing balance-Leahy Scale: Fair ?Standing balance comment: able to stand at sink and wash hands without UE support ?  ?  ?  ?  ?  ?  ?  ?  ?  ?  ?  ?  ? ?  ?Cognition Arousal/Alertness: Awake/alert ?Behavior During Therapy: [facility] for tasks assessed/performed ?Overall Cognitive Status: Within Functional Limits for tasks assessed ?  ?  ?  ?  ?  ?  ?  ?  ?  ?  ?  ?  ?  ?  ?  ?  ?General Comments: dozing on entry but wakes easily and is willing  to work with therapy ?  ?  ? ?  ?   ?General Comments General comments (skin integrity, edema, etc.): VSS on 2L O2 via [place], SpO2 >92%O2 with ambulation ?  ?  ? ?Pertinent Vitals/Pain Pain Assessment ?Pain Assessment: Faces ?Faces Pain Scale: Hurts a little bit ?Pain Location: back pain ?Pain Descriptors / Indicators: Guarding ?Pain Intervention(s): Limited activity within patient's tolerance, Monitored during session, Repositioned   ? ? ? ?PT Goals (current goals can now be found in the care plan section) Acute Rehab PT Goals ?PT Goal Formulation: With patient ?Time For Goal Achievement: 11/07/21 ?Potential to Achieve Goals: Good ?Progress towards PT goals: Progressing toward goals ? ?  ?Frequency ? ? ? Min 3X/week ? ? ? ?  ?PT Plan Current plan remains appropriate  ? ? ?   ?AM-PAC PT "6 Clicks" Mobility   ?Outcome Measure ? Help needed turning from your back to your side while in a flat bed without using bedrails?: None ?Help needed moving from lying on your back to sitting on the side of a flat bed without using bedrails?: None ?Help needed moving to and from a bed to a chair (including a wheelchair)?: A Little ?Help needed standing up from a chair using your arms (e.g., wheelchair or bedside chair)?: A Little ?Help needed to walk in hospital room?: A Little ?Help needed climbing 3-5 steps with a railing? : A Little ?6 Click Score: 20 ? ?  ?End of Session Equipment Utilized During Treatment: Gait belt ?Activity Tolerance: Patient tolerated treatment well ?Patient left: in bed;with call bell/phone within reach;with family/visitor present ?Nurse Communication: Mobility status ?PT Visit Diagnosis: Muscle weakness (generalized) (M62.81);Unsteadiness on feet (R26.81);Pain ?Pain - Right/Left:  (back) ?Pain - part of body:  (back) ?  ? ? ?Time: 8466-5993 ?PT Time Calculation (min) (ACUTE ONLY): 17 min ? ?Charges:  $Therapeutic Exercise: 8-22 mins          ?          ? ?Diane Mochizuki B. Migdalia Dk PT, DPT ?Acute Rehabilitation Services ?Please use secure chat or  ?Call Office [phone] ? ? ? ? ?[place] ?10/29/2021, 3:00 PM ? ?

## 2021-10-29 NOTE — Progress Notes (Signed)
?PROGRESS NOTE ? ? ? ?Katie Holland  OAC:[id number] DOB: 1966-06-23 DOA: 10/22/2021 ?PCP: Billie Ruddy, MD  ? ?Brief Narrative:  ?The patient is a 56 year old African-American female with past medical history significant for but not limited to alcoholic liver failure, history of alcohol abuse, history of tobacco abuse, history of esophageal varices, history of upper GI bleed as well as other comorbidities who presented with worsening shortness of breath and increased O2 requirement.  She underwent an ultrasound-guided therapeutic paracentesis that yielded about 9.8 L of clear yellow fluid and had tolerated well and was administered 75 g of albumin.  Subsequently she is noted to be short of breath and had an elevated heart rate and high blood pressure even after paracentesis.  She is noted to be saturating 89% and increased work of bleeding and to be placed on 4 L of supplemental oxygen which improved her O2 saturations 96%.  There is attempt to wean her from her 4 L but because she failed to do so she was sent the ED for further evaluation.  She is noted to be somewhat more lethargic than usual and she was on home Lasix and spironolactone.  She also had some abdominal pain and does not drink anymore and her last drink was 3 months ago.  She continues to smoke cigarettes 6 to 7 cigarettes a day and think about quitting.  She had no bleeding and her last thoracentesis was 9 months ago she stated.  While in the ED she is noted to have a lactic acid of 2.3 and a chest x-ray that was worrisome for her right middle right lower lobe large right pleural effusion but also could be pneumonia versus CHF.  GI was notified of patient's admission and blood cultures and sputum cultures were obtained.  She underwent a CT of the chest which showed a right large sided pleural effusion with right middle lobe and right lower lobe consolidation as well as residual ascites in the abdomen following paracentesis.  She had cirrhotic changes  of the liver that were seen.  She is admitted for severe sepsis given that her lactic acid was level greater than 2 and that she had new oxygen requirement of 4 L.  She was placed on antibiotics azithromycin and ceftriaxone.  She is found to have 1 out of 4 bottles of E. coli bacteremia with possible pneumonia and she is continued on ceftriaxone and azithromycin.  IR was also consulted and she underwent a 1.8 L thoracentesis and there was question removed with there this was a hepatic hydrothorax.  She is continue on ceftriaxone and azithromycin and she did have large ascites that is refractory to high-dose diuretics.  GI evaluated and recommended resuming her home diuretic doses of Lasix 40 mg twice daily spironolactone 20 mg daily follow-up for a low sodium diet.  Patient was stressed to get a TIPS however this was canceled in the setting of her bacteremia ? ?GI has initiated her back on her home diuretics but if her renal function is okay there we will try increasing as she tolerates tomorrow.  They are recommending a discharge that she stay on Lasix 40 mg twice daily and spironolactone 200 g daily but they may increase it while she is in house.  Repeat chest x-ray yesterday showed a moderate pleural effusion again and today CXR showed "Large right pleural effusion, increased in size since the study from 1 day prior with worsened aeration of the right lung." ? ?Repeat CXR showed worsening  large right pleural effusion and because of her shortness of breath while ambulation a repeat thoracentesis was ordered.  GI recommending continuing diuretics and may increase the dose and recommending continuing low-sodium diet. ? ?She is s/p Thoracentesis again and 1 Liter was removed on 10/25/21.  GI has increased her diuretics to 40 mg of Lasix 3 times daily and 300 mg spironolactone daily however yesterday when I  walked in the room she was eating a large bag of [employer] potato chips so I advised her about her salt  intake. ? ?10/27/21 she was actively vomiting and retching and did not feel as good. She is improved 10/28/21 but back on Supplemental O2 and complains of slight SOB.  GI will reach out to interventional radiology about timing of the TIPS and will continue antibiotics for 7 days for her E. coli bacteremia.  GI recommending continuing current dose of diuretics and not escalating dose yet ? ?10/28/21 Had worsening of he Pleural Effusion and had some dyspnea so Pulmonary was consulted who feels that she has a hepatic hydrothorax and recommending definitive management with TIPS but recommending another thoracentesis for comfort if she becomes more dyspenic.  GI spoken with IR and they will write a note outlining the plans regarding future TIPS.  Her antibiotics were switched yesterday from ceftriaxone to Ancef for 2 more day to complete 7 days of gram-negative coverage.  ? ? ?Assessment and Plan: ? ?Severe sepsis in the setting of E. coli bacteremia with concomitant pneumonia ?-Had a new O2 requirement and was found to have a very large consolidation and pleural effusion which was tapped and revealed 1.9  ?-Blood cultures grew 1 out of 4 E. coli and she is currently on IV ceftriaxone and azithromycin which we will continue ?-Source of infection is likely her pulmonary/intra-abdominal and she did meet severe sepsis criteria given that she had endorgan damage with a lactic acid of 2.3 which then trended up to 3.8.  She did have a new O2 requirement and had acute respiratory failure with hypoxia ?-Continue antibiotics and continue evaluation.  She is given IV fluid hydration however now will discontinue given that she is going to be aggressively diuresed ?-She completed CAP Coverage with IV Ceftriaxone and Azithromycin and will de-escalate to IV Cefazolin now for 2 more days ?-IR is wanting repeat blood cultures being negative to reschedule elective outpatient TIPS so repeat blood cultures x2 today ?-Patient had a  community-acquired pneumonia as she presented with productive cough, fever, hypoxia and infiltrate in the right lower lobe chest x-ray consistent with pneumonia.  We will check a strep pneumo urine antigen and urine Legionella antigen ?-Procalcitonin level was elevated 3.38 and she had an elevated lactic Level and it trended up from 2.3 -> 3.8 ?-Repeat CXR this AM showed "Interval progression of large right pleural effusion with persistent airspace opacity in the right mid and lower lung." ?-PT/OT recommending Home Health unstable to discharge ?-Sepsis physiology has improved significantly from the time of admission ?  ?Acute Respiratory Failure with Hypoxia in the setting of pleural effusion and community pneumonia;  ?Recurrent pleural effusion and hepatic hydrothorax ?-Continue with treatment as above with antibiotics and diuretics  ?-Continuous pulse oximetry maintain O2 saturation greater than 90% ?-Continue supplemental oxygen via nasal cannula wean O2 as tolerated ?-SpO2: 92 % ?O2 Flow Rate (L/min): 2 L/min; Continues to be on Supplemental O2 ?-Initiated Xopenex/Atrovent q6hprn ?-Flutter Valve, Incentive Spirometry, and Guaifenesin 1200 mg po BID  ?-Given her persistent and recurrent pleural  effusion pulmonary was consulted ?-S/p 2 Thoracentesis this Admission and if she becomes more dyspneic then pulmonary is recommending obtaining a thoracentesis but they feel that the definitive management for hepatic hydrothorax is a TIPS procedure ?-CXR done 5/8 showed "Interval progression of large right pleural effusion with persistent airspace opacity in the right mid and lower lung." ?-Repeat chest x-ray in the a.m. and she will need an ambulatory home O2 screen prior to discharge ?  ?Decompensated Alcoholic cirrhosis of the liver with ascites that is refractory to Diuretics ?Hyperbilirubinemia ?-Her MELD NA score was 15 and she is followed by GI ?-She is possibly having a TIPS procedure by IR but GIs been consulted  and this will be placed on hold given her new bacteremia ?-GI is following and she has had serial large-volume paracentesis at least twice a month.  Her last paracentesis yesterday yielded 9.8 L.  Current nucleated cell count was

## 2021-10-29 NOTE — Progress Notes (Addendum)
? ?       Daily Rounding Note ? ?10/29/2021, 10:58 AM ? LOS: 7 days  ? ?SUBJECTIVE:   ?Chief complaint: Decompensated cirrhosis with ascites, frequent paracenteses.  New pleural effusions this admission. ? ?SOB overnight, concomitant oxygen sats of 86% on 2 L nasal cannula oxygen.  Progressive R pleural effusion on CXR.  Pulm consult obtained.   ?Remains relatively hypotensive with heart rate in the low to upper 90s/mid 50s to 60.  Heart rate as high as 105 but ranging mid 80s to mid 90s.Katie Holland   ?Breathing a bit better now late this morning.  Has not noticed recurrent abdominal swelling or discomfort.  Appetite fair, tolerating solid diet. ? ?OBJECTIVE:        ? Vital signs in last 24 hours:    ?Temp:  [97.2 ?F (36.2 ?C)-98.6 ?F (37 ?C)] 97.7 ?F (36.5 ?C) (05/09 0750) ?Pulse Rate:  [80-110] 84 (05/09 0750) ?Resp:  [17-23] 20 (05/09 0750) ?BP: (91-102)/(56-63) 94/61 (05/09 0750) ?SpO2:  [86 %-99 %] 95 % (05/09 0750) ?Last BM Date : 10/28/21 ?Filed Weights  ? 10/24/21 1520  ?Weight: 57.5 kg  ? ?General: Thin, chronically ill-appearing but comfortable.  Alert ?Heart: RRR. ?Chest: Compared with yesterday breath sounds on the right side are further diminished and diminished breath sounds go at least halfway to possibly two thirds of the way up the lung.  No labored breathing or cough at rest. ?Abdomen: Not distended or tender.  Active bowel sounds.  Soft ?Extremities: Thin arms and legs without edema. ?Neuro/Psych: Alert.  Oriented x3.  Moves all 4 limbs without gross deficits. ? ?Intake/Output from previous day: ?05/08 0701 - 05/09 0700 ?In: 68.7 [IV Piggyback:68.7] ?Out: -  ? ?Intake/Output this shift: ?Total I/O ?In: -  ?Out: 450 [Urine:450] ? ?Lab Results: ?Recent Labs  ?  10/27/21 ?0110 10/28/21 ?0865 10/29/21 ?0356  ?WBC 6.5 7.2 6.4  ?HGB 8.8* 9.3* 8.6*  ?HCT 26.7* 29.2* 25.9*  ?PLT 143* 145* 145*  ? ?BMET ?Recent Labs  ?  10/27/21 ?0110 10/28/21 ?7846 10/29/21 ?0356   ?NA 136 136 134*  ?K 3.7 4.1 4.2  ?CL 107 104 103  ?CO2 '23 26 27  '$ ?GLUCOSE 116* 104* 94  ?BUN '8 9 10  '$ ?CREATININE 0.82 0.90 0.93  ?CALCIUM 8.4* 8.7* 8.6*  ? ?LFT ?Recent Labs  ?  10/27/21 ?0110 10/28/21 ?9629 10/29/21 ?0356  ?PROT 5.0* 5.5* 5.2*  ?ALBUMIN 2.3* 2.4* 2.3*  ?AST 27 32 29  ?ALT '15 21 20  '$ ?ALKPHOS 79 74 88  ?BILITOT 0.7 1.0 0.7  ? ?PT/INR ?No results for input(s): LABPROT, INR in the last 72 hours. ?Hepatitis Panel ?No results for input(s): HEPBSAG, HCVAB, HEPAIGM, HEPBIGM in the last 72 hours. ? ?Studies/Results: ?DG CHEST PORT 1 VIEW ? ?Result Date: 10/29/2021 ?CLINICAL DATA:  Shortness of breath. EXAM: PORTABLE CHEST 1 VIEW COMPARISON:  10/27/2021 FINDINGS: Large right pleural effusion appears progressive in the interval with persistent airspace opacity in the right mid and lower lung. Left lung remains relatively clear. The cardiopericardial silhouette is within normal limits for size. The visualized bony structures of the thorax are unremarkable. Telemetry leads overlie the chest. IMPRESSION: Interval progression of large right pleural effusion with persistent airspace opacity in the right mid and lower lung. Electronically Signed   By: Misty Stanley M.D.   On: 10/29/2021 07:59   ? ?\Scheduled Meds: ? cholecalciferol  1,000 Units Oral Daily  ? furosemide  40 mg Oral TID  ? guaiFENesin  1,200  mg Oral BID  ? lactose free nutrition  237 mL Oral BID BM  ? lactulose  20 g Oral BID  ? multivitamin with minerals  1 tablet Oral Daily  ? nicotine  7 mg Transdermal Daily  ? sodium chloride flush  3 mL Intravenous Q12H  ? spironolactone  300 mg Oral Daily  ? vitamin B-12  1,000 mcg Oral Daily  ? vitamin B-6  50 mg Oral Daily  ? zinc sulfate  220 mg Oral Daily  ? ?Continuous Infusions: ? sodium chloride    ?  ceFAZolin (ANCEF) IV 2 g (10/29/21 0505)  ? ?PRN Meds:.sodium chloride, fentaNYL (SUBLIMAZE) injection, ipratropium, levalbuterol, lidocaine (PF), ondansetron (ZOFRAN) IV, prochlorperazine, sodium chloride  flush, traMADol  ?ASSESMENT:  ? ?Decompensated, alcoholic cirrhosis.  Still drinking occasional beer. ? ?Ascites requiring frequent large-volume paracentesis, questionable compliance with home diuretics now on high-dose furosemide, Aldactone without renal impairment.  Not compliant with low-sodium diet.  Plans for TIPS last week canceled due to bacteremia. ? ?Large right pleural effusion, hepatic hydrothorax..  1.9 L thoracentesis 5/3, 1 L thoracentesis 5/5.  Dyspnea this morning with chest x-ray showing progression of large right pleural effusion and persistent right mid and lower lung opacity.  See pulmonary consult from this morning, not inclined to repeat thoracentesis at this point but not ruling it out.  Pulmonary recommending continuing with aggressive diuresis.   ? ?E. coli bacteremia in setting of pneumonia.  PCR ID panel also showing Enterobacter in addition to E. coli. Ancef day 2.  Following 7 days Rocephin, 5 d Azithromycin.   ? ?Microcytic anemia.  Low B12, started on oral B12 this admission. ? ? Hx esophageal varices with banding starting 2019.  Latest EGD 08/22/2021 with trace distal varices, scarring from previous banding and food residue in stomach, duodenum. ? ? ? ? ?PLAN  ? ?Spoke with IR and they will write a note with outline for plans regarding future TIPS. ? ?Pulmonary following pleural effusion. ? ? ? ? ?Katie Holland  10/29/2021, 10:58 AM ?Phone [phone]  ? ?I have taken an interval history, thoroughly reviewed the chart and examined the patient. I agree with the Advanced Practitioner's note, impression and recommendations, and have recorded additional findings, impressions and recommendations below. ?I performed a substantive portion of this encounter (>50% time spent), including a complete performance of the medical decision making. ? ?My additional thoughts are as follows: ? ?This patient's sepsis appears to have cleared, and she should have repeat blood cultures to confirm clearance of  the bacteremia to aid the IR service with a decision regarding TIPS timing. ? ?Her pleural effusions are recurring very quickly with ongoing supplemental oxygen requirement despite thoracentesis, and I believe that will continue to be the case until she gets a TIPS. ?She has been on escalating doses of diuretics and is near maximal dose.  I do not feel we can safely increase the Aldactone or the furosemide to the maximum doses of 400 mg and 160 mg respectively given the risk of precipitating hepatorenal syndrome and because she is already having intermittent hypotension. ?Therefore, the initial plan for outpatient high-dose diuretics, sodium restriction and outpatient tips no longer seems appropriate. ?My opinion is this patient should have her TIPS placed prior to discharge when we know that her bacteremia has cleared. ?The medicine and IR service should confer directly regarding the planned duration of antibiotic therapy and timing of repeat blood cultures to help make that decision. ? ? ?35 minutes were  spent on this encounter (including chart review, history/exam, counseling/coordination of care, and documentation) > 50% of that time was spent on counseling and coordination of care. ? ?Nelida Meuse III ?Office:[phone] ? ?

## 2021-10-30 ENCOUNTER — Inpatient Hospital Stay (HOSPITAL_COMMUNITY): Payer: Medicare HMO

## 2021-10-30 DIAGNOSIS — J9601 Acute respiratory failure with hypoxia: Secondary | ICD-10-CM | POA: Diagnosis not present

## 2021-10-30 DIAGNOSIS — A419 Sepsis, unspecified organism: Secondary | ICD-10-CM | POA: Diagnosis not present

## 2021-10-30 DIAGNOSIS — K7031 Alcoholic cirrhosis of liver with ascites: Secondary | ICD-10-CM | POA: Diagnosis not present

## 2021-10-30 DIAGNOSIS — R6520 Severe sepsis without septic shock: Secondary | ICD-10-CM | POA: Diagnosis not present

## 2021-10-30 DIAGNOSIS — J90 Pleural effusion, not elsewhere classified: Secondary | ICD-10-CM | POA: Diagnosis not present

## 2021-10-30 DIAGNOSIS — J189 Pneumonia, unspecified organism: Secondary | ICD-10-CM | POA: Diagnosis not present

## 2021-10-30 LAB — CBC WITH DIFFERENTIAL/PLATELET
Abs Immature Granulocytes: 0.03 10*3/uL (ref 0.00–0.07)
Basophils Absolute: 0 10*3/uL (ref 0.0–0.1)
Basophils Relative: 0 %
Eosinophils Absolute: 0.3 10*3/uL (ref 0.0–0.5)
Eosinophils Relative: 6 %
HCT: 25.8 % — ABNORMAL LOW (ref 36.0–46.0)
Hemoglobin: 8.5 g/dL — ABNORMAL LOW (ref 12.0–15.0)
Immature Granulocytes: 1 %
Lymphocytes Relative: 22 %
Lymphs Abs: 1.2 10*3/uL (ref 0.7–4.0)
MCH: 23.8 pg — ABNORMAL LOW (ref 26.0–34.0)
MCHC: 32.9 g/dL (ref 30.0–36.0)
MCV: 72.3 fL — ABNORMAL LOW (ref 80.0–100.0)
Monocytes Absolute: 0.8 10*3/uL (ref 0.1–1.0)
Monocytes Relative: 15 %
Neutro Abs: 3 10*3/uL (ref 1.7–7.7)
Neutrophils Relative %: 56 %
Platelets: 148 10*3/uL — ABNORMAL LOW (ref 150–400)
RBC: 3.57 MIL/uL — ABNORMAL LOW (ref 3.87–5.11)
RDW: 23.8 % — ABNORMAL HIGH (ref 11.5–15.5)
WBC: 5.4 10*3/uL (ref 4.0–10.5)
nRBC: 0 % (ref 0.0–0.2)

## 2021-10-30 LAB — COMPREHENSIVE METABOLIC PANEL
ALT: 18 U/L (ref 0–44)
AST: 32 U/L (ref 15–41)
Albumin: 2.3 g/dL — ABNORMAL LOW (ref 3.5–5.0)
Alkaline Phosphatase: 105 U/L (ref 38–126)
Anion gap: 4 — ABNORMAL LOW (ref 5–15)
BUN: 10 mg/dL (ref 6–20)
CO2: 28 mmol/L (ref 22–32)
Calcium: 8.8 mg/dL — ABNORMAL LOW (ref 8.9–10.3)
Chloride: 103 mmol/L (ref 98–111)
Creatinine, Ser: 0.79 mg/dL (ref 0.44–1.00)
GFR, Estimated: >60 mL/min (ref >60)
Glucose, Bld: 128 mg/dL — ABNORMAL HIGH (ref 70–99)
Potassium: 4.3 mmol/L (ref 3.5–5.1)
Sodium: 135 mmol/L (ref 135–145)
Total Bilirubin: 0.7 mg/dL (ref 0.3–1.2)
Total Protein: 5.4 g/dL — ABNORMAL LOW (ref 6.5–8.1)

## 2021-10-30 LAB — CULTURE, BODY FLUID W GRAM STAIN -BOTTLE: Culture: NO GROWTH

## 2021-10-30 LAB — PHOSPHORUS: Phosphorus: 3.5 mg/dL (ref 2.5–4.6)

## 2021-10-30 LAB — MAGNESIUM: Magnesium: 1.8 mg/dL (ref 1.7–2.4)

## 2021-10-30 MED ORDER — OXYCODONE HCL 5 MG PO TABS
2.5000 mg | ORAL_TABLET | Freq: Once | ORAL | Status: AC
  Administered 2021-10-30: 2.5 mg via ORAL
  Filled 2021-10-30 (×2): qty 1

## 2021-10-30 MED ORDER — MELATONIN 3 MG PO TABS
3.0000 mg | ORAL_TABLET | Freq: Every evening | ORAL | Status: DC | PRN
  Administered 2021-10-30 – 2021-11-07 (×7): 3 mg via ORAL
  Filled 2021-10-30 (×7): qty 1

## 2021-10-30 MED ORDER — OXYCODONE HCL 5 MG PO TABS
2.5000 mg | ORAL_TABLET | Freq: Once | ORAL | Status: AC
  Administered 2021-10-30: 2.5 mg via ORAL
  Filled 2021-10-30: qty 1

## 2021-10-30 NOTE — Progress Notes (Addendum)
? ?NAME:  Katie Holland, MRN:  [id number], DOB:  1965/11/03, LOS: 8 ?ADMISSION DATE:  10/22/2021, CONSULTATION DATE: 10/29/2021 ?REFERRING MD: [facility], CHIEF COMPLAINT: Recurrent right pleural effusion ? ?History of Present Illness:  ?56 year old female with end-stage liver disease poorly compliant with treatment and has had 25 paracentesis in for thoracentesis over the last year.  Note her last paracentesis 10/22/2021 with 9.8 L of fluid obtained.  She also had a thoracentesis on 5 3 with 1.9 L and 5 5 with 1 L.  Notably her right pleural effusion has returned pulmonary critical care is asked to evaluate.  She has been evaluated for TIPS procedure in the near future.  She has had multiple interventional radiology procedures for banding in the past.  She is in no respiratory distress at this time.  She is very comfortable on 2 L nasal cannula.  She denies pain shortness of breath.  She is on a nicotine patch and  reported to smoke occasionally.  Her past medical history is well-documented below.  She be evaluated by pulmonary MD for questionable interventions. ? ?Pertinent  Medical History  ? ?Past Medical History:  ?Diagnosis Date  ? Alcoholic liver disease ([place])   ? Chronic back pain   ? on disability  ? Cirrhosis of liver ([place])   ? Esophageal varices ([facility])   ? GERD (gastroesophageal reflux disease)   ? Headache   ? Jaundice   ? ? ? ?Significant Hospital Events: ?Including procedures, antibiotic start and stop dates in addition to other pertinent events   ?See interventional radiology for list of procedures ?Interim History / Subjective:  ?No overnight issues. She is resting comfortably this morning and husband at bedside. Questions answered.  ? ?Objective   ?Blood pressure 104/63, pulse 92, temperature 98.3 ?F (36.8 ?C), temperature source Oral, resp. rate 20, weight 57.5 kg, SpO2 94 %. ?   ?   ? ?Intake/Output Summary (Last 24 hours) at 10/30/2021 1010 ?Last data filed at 10/29/2021 1519 ?Gross per 24 hour  ?Intake 100 ml   ?Output --  ?Net 100 ml  ? ?Filed Weights  ? 10/24/21 1520  ?Weight: 57.5 kg  ? ? ?Examination: ?Cachectic and appears older than stated age ?Laying flat comfortably on room air ?Breath sounds somewhat diminished on the right. ?Breathing is nonlabored without wheezes or crackles ?Tachycardic, regular ?Distended abdomen with soft ascites ? ?Chest xray reviewed with worsening right sided effusion.  ? ? ?Resolved Hospital Problem list   ? ? 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 Coli bacteremia ?Tobacco Use disorder ? ?Notes from IR and hepatology noted. ?Her sepsis seems improved given she's afebrile, no leukocytosis or leukopenia, and is on day 4 of abx.  ?I agree that inpatient TIPS is wise if feasible given her failure of outpatient therapy with diuretics and rapid recurrence of ascites/pleural fluid within five days.  ?Given she is not having debilitating symptoms on the right effusion, will defer thoracentesis today. She probably will need it drained before discharge once definitive action can be taken on her portal hypertension.  ? ? ?I spent 35 minutes in total visit time for this patient, with more than 50% spent counseling/coordinating care. ? ?Pulmonary will follow.  ? ? ?Lenice Llamas, MD ?Pulmonary and Critical Care Medicine ?[facility] ?10/30/2021 10:11 AM ?Pager: see AMION ? ?If no response to pager, please call critical care on call (see AMION) until 7pm ?After 7:00 pm  call Elink   ? ? ? ?

## 2021-10-30 NOTE — Progress Notes (Addendum)
? ?       Daily Rounding Note ? ?10/30/2021, 11:17 AM ? LOS: 8 days  ? ?SUBJECTIVE:   ?Chief complaint:    ascites   pleural effusion   cirrhosis.   ? ?No dyspnea at rest.  + SOB w exertion but not profound.  Does not feel like belly swelling has progressed.   ? ?OBJECTIVE:        ? Vital signs in last 24 hours:    ?Temp:  [97.7 ?F (36.5 ?C)-98.3 ?F (36.8 ?C)] 98.3 ?F (36.8 ?C) (05/10 0747) ?Pulse Rate:  [91-101] 92 (05/10 0747) ?Resp:  [16-20] 20 (05/10 0747) ?BP: (85-107)/(52-68) 104/63 (05/10 0747) ?SpO2:  [88 %-99 %] 94 % (05/09 2127) ?Last BM Date : 10/29/21 ?Filed Weights  ? 10/24/21 1520  ?Weight: 57.5 kg  ? ?General: comfortable, thin, ill looking.     ?Heart: RRR ?Chest: diminished BS on R.  No labored breathing at rest ?Abdomen: protuberant but soft.  NT.  Active BS  ?Extremities: no CCE ?Neuro/Psych:  oriented x 3.  No asterixis or tremors.  Appropriate, not confused.   ? ?Intake/Output from previous day: ?05/09 0701 - 05/10 0700 ?In: 100 [IV Piggyback:100] ?Out: 450 [Urine:450] ? ?Intake/Output this shift: ?No intake/output data recorded. ? ?Lab Results: ?Recent Labs  ?  10/28/21 ?5374 10/29/21 ?0356 10/30/21 ?8270  ?WBC 7.2 6.4 5.4  ?HGB 9.3* 8.6* 8.5*  ?HCT 29.2* 25.9* 25.8*  ?PLT 145* 145* 148*  ? ?BMET ?Recent Labs  ?  10/28/21 ?7867 10/29/21 ?0356 10/30/21 ?5449  ?NA 136 134* 135  ?K 4.1 4.2 4.3  ?CL 104 103 103  ?CO2 '26 27 28  '$ ?GLUCOSE 104* 94 128*  ?BUN '9 10 10  '$ ?CREATININE 0.90 0.93 0.79  ?CALCIUM 8.7* 8.6* 8.8*  ? ?LFT ?Recent Labs  ?  10/28/21 ?2010 10/29/21 ?0356 10/30/21 ?0712  ?PROT 5.5* 5.2* 5.4*  ?ALBUMIN 2.4* 2.3* 2.3*  ?AST 32 29 32  ?ALT '21 20 18  '$ ?ALKPHOS 74 88 105  ?BILITOT 1.0 0.7 0.7  ? ?PT/INR ?No results for input(s): LABPROT, INR in the last 72 hours. ?Hepatitis Panel ?No results for input(s): HEPBSAG, HCVAB, HEPAIGM, HEPBIGM in the last 72 hours. ? ?Studies/Results: ?DG CHEST PORT 1 VIEW ? ?Result Date: 10/30/2021 ?CLINICAL  DATA:  Short of breath EXAM: PORTABLE CHEST 1 VIEW COMPARISON:  10/29/2021 FINDINGS: Large right pleural effusion. Apical component has improved however there is increase in pleural fluid in the mid and lower chest on the right. There is extensive airspace disease in the right lung particularly right lung base similar to the prior study. Improved aeration in the right apex. Left lung remains clear.  Normal vascularity.  Heart size normal. IMPRESSION: Progression of large right pleural effusion. Extensive airspace disease in the right lung probably the right lung base is similar. Improved aeration right apex. Left lung remains clear. Electronically Signed   By: Franchot Gallo M.D.   On: 10/30/2021 08:01  ? ?DG CHEST PORT 1 VIEW ? ?Result Date: 10/29/2021 ?CLINICAL DATA:  Shortness of breath. EXAM: PORTABLE CHEST 1 VIEW COMPARISON:  10/27/2021 FINDINGS: Large right pleural effusion appears progressive in the interval with persistent airspace opacity in the right mid and lower lung. Left lung remains relatively clear. The cardiopericardial silhouette is within normal limits for size. The visualized bony structures of the thorax are unremarkable. Telemetry leads overlie the chest. IMPRESSION: Interval progression of large right pleural effusion with persistent airspace opacity in the right mid and lower lung.  Electronically Signed   By: Misty Stanley M.D.   On: 10/29/2021 07:59   ? ?Scheduled Meds: ? cholecalciferol  1,000 Units Oral Daily  ? furosemide  40 mg Oral TID  ? guaiFENesin  1,200 mg Oral BID  ? lactose free nutrition  237 mL Oral BID BM  ? lactulose  20 g Oral BID  ? multivitamin with minerals  1 tablet Oral Daily  ? nicotine  7 mg Transdermal Daily  ? sodium chloride flush  3 mL Intravenous Q12H  ? spironolactone  300 mg Oral Daily  ? vitamin B-12  1,000 mcg Oral Daily  ? vitamin B-6  50 mg Oral Daily  ? zinc sulfate  220 mg Oral Daily  ? ?Continuous Infusions: ? sodium chloride    ? ?PRN Meds:.sodium  chloride, fentaNYL (SUBLIMAZE) injection, ipratropium, levalbuterol, lidocaine (PF), ondansetron (ZOFRAN) IV, prochlorperazine, sodium chloride flush, traMADol ? ? ?ASSESMENT:  ? ?Decompensated, alcoholic cirrhosis.  Still drinking occasional beer. ?  ?Ascites requiring frequent large-volume paracentesis, questionable compliance with home diuretics now on high-dose furosemide, Aldactone without renal impairment.  Not compliant with low-sodium diet.  Plans for TIPS last week canceled due to bacteremia. ?  ?Large right pleural effusion, hepatic hydrothorax.  1.9 L thoracentesis 5/3, 1 L thoracentesis 5/5.  Dyspnea this morning with chest x-ray showing progression of large right pleural effusion and persistent right mid and lower lung opacity.  Pulmonary following not inclined to repeat thoracentesis at this point but not ruling it out.  Pulmonary recommending continuing with aggressive diuresis and TIPS when safe.  As of today no need for repeat thoracentesis.     ?  ?E. coli bacteremia in setting of pneumonia.  PCR ID panel also showing Enterobacter in addition to E. coli. Ancef day 3.  previous 7 days Rocephin, 5 d Azithromycin.   ?  ?Microcytic anemia.  Low B12, started on oral B12 this admission. ?  ? Hx esophageal varices with banding starting 2019.  Latest EGD 08/22/2021 with trace distal varices, scarring from previous banding and food residue in stomach, duodenum.  ? ? ?PLAN  ? ?Repeat blood clx, drawn yesterday 5/9 w no growth thus far.  Attending MD says 2 more days Cefazolin.  If blood clxs are negative, TIPS can be performed.  Unlikely to get Ascites, pleural effusion under control w/O TIPS.       ? ? ? ?Katie Holland  10/30/2021, 11:17 AM ?Phone [phone]  ? ?I have taken an interval history, thoroughly reviewed the chart and examined the patient. I agree with the Advanced Practitioner's note, impression and recommendations, and have recorded additional findings, impressions and recommendations below. ?I  performed a substantive portion of this encounter (>50% time spent), including a complete performance of the medical decision making. ? ?My additional thoughts are as follows: ? ?She remains significantly volume overloaded with ascites that is not tense, but has now caused a umbilical hernia to be noticeable.  She has continued large right-sided pleural effusion with decreased lung sounds throughout the right side requiring supplemental oxygen with 2 L of nasal cannula with a current oxygen saturation of 92%. ? ?Much thanks to the internal medicine and interventional radiology services further evaluation of this patient and plan for probable inpatient TIPS a day after tomorrow if blood cultures remain negative. ? ?Our service will check back the day after the TIPS placement, please call sooner if urgent matters arise requiring our attention. ?Nelida Meuse III ?[phone] ? ? ?

## 2021-10-30 NOTE — Progress Notes (Signed)
?  Patient is medically cleared to proceed with TIPS. ? ?Will plan for Friday. ? ?Will see patient and consent her Friday am per anesthesia policy. ? ?NPO after MN order in place, no anticoagulation please. ? ?Murrell Redden PA-C ?10/30/2021 ?1:46 PM ? ? ? ? ?

## 2021-10-30 NOTE — Progress Notes (Signed)
SATURATION QUALIFICATIONS: (This note is used to comply with regulatory documentation for home oxygen) ? ?Patient Saturations on Room Air at Rest = 89% ? ?Patient Saturations on Room Air while Ambulating = 84% ? ?Patient Saturations on 4 Liters of oxygen while Ambulating = 92% ? ?Please briefly explain why patient needs home oxygen: ?

## 2021-10-30 NOTE — Progress Notes (Signed)
Nutrition Follow-up ? ?DOCUMENTATION CODES:  ? ?Severe malnutrition in context of chronic illness ? ?INTERVENTION:  ? ?Continue Multivitamin w/ minerals daily ?Continue Boost Plus po BID, provides 360 kcal and 14 grams of protein each. ?Encourage good PO intake  ? ?NUTRITION DIAGNOSIS:  ? ?Severe Malnutrition related to chronic illness (cirrhosis) as evidenced by severe muscle depletion, severe fat depletion. ? ?GOAL:  ? ?Patient will meet greater than or equal to 90% of their needs ? ?MONITOR:  ? ?PO intake, Supplement acceptance, Labs, Weight trends, I & O's ? ?REASON FOR ASSESSMENT:  ? ?Consult ?Assessment of nutrition requirement/status ? ?ASSESSMENT:  ? ?56 y.o. female presented to the ED with shortness of breath and increase oxygen requirement. PMH includes EtOH abuse, cirrhosis, malnutrition, and esophageal varices. Pt admitted with sepsis.  ? ?5/02 - Paracentesis (9.8 L removed) ?5/03 - Thoracentesis (1.9 L removed) ? ?Pt reports that she has been eating well, states that she still is feeling full on occasion with eating. Reports that she is drinking the shakes and she likes them; family also bringing in [employer] from home. Pt requesting salt alternative packet on all trays; RD to add. Denies any nausea or vomiting.  ?No meal intake recorded within EMR. ? ?Micronutrient labs returned, have started repletion.  ? ?Pt with no other questions or concerns at this time.  ? ?Plan for TIPS in radiology on Friday.  ? ?Medications reviewed and include: Vitamin D3, Lasix, Lactulose, MVI, Spironolactone, Vitamin B12, Vitamin B6, Zinc Sulfate ?Labs reviewed: Vitamin D 24.54, Vitamin b6 2.8, Zinc 37, Vitamin B1 124.4 ? ?Diet Order:   ?Diet Order   ? ?       ?  Diet NPO time specified Except for: Sips with Meds  Diet effective midnight       ?  ?  Diet 2 gram sodium Room service appropriate? Yes with Assist; Fluid consistency: Thin  Diet effective now       ?  ? ?  ?  ? ?  ? ? ?EDUCATION NEEDS:  ? ?No education  needs have been identified at this time ? ?Skin:  Skin Assessment: Reviewed RN Assessment ? ?Last BM:  5/9 ? ?Height:  ? ?Ht Readings from Last 1 Encounters:  ?10/30/21 '5\' 4"'$  (1.626 m)  ? ? ?Weight:  ? ?Wt Readings from Last 1 Encounters:  ?10/24/21 57.5 kg  ? ? ?Ideal Body Weight:  54.6 kg ? ?BMI:  Body mass index is 21.75 kg/m?. ? ?Estimated Nutritional Needs:  ? ?Kcal:  1800-2000 ? ?Protein:  90-105 grams ? ?Fluid:  >/= 1.8 L ? ? ? ?Hermina Barters RD, LDN ?Clinical Dietitian ?See AMiON for contact information.  ? ?

## 2021-10-30 NOTE — Progress Notes (Signed)
Physical Therapy Treatment ?Patient Details ?Name: Katie Holland ?MRN: [id number] ?DOB: [birth year] ?Today's Date: 10/30/2021 ? ? ?History of Present Illness 56 y/o female presented to ED on 10/22/21 for SOB and chest tightness after she received paracentesis earlier that day. Admitted for sepsis 2/2 E. coli bacteremia and CAP. PMH: ETOH cirrhosis ? ?  ?PT Comments  ? ? Pt is demonstrating improved endurance and balance, ambulating up to ~210 ft at a min guard assist level using her SPC today. However, she continues to display balance deficits with an occasional lateral trunk sway or drift while ambulating, placing her at risk for falls. Focused remainder of session on improving her reactional strategies through static narrow stance balance exercises and improving her lower extremity strength through standing therapeutic exercises. Will continue to follow acutely. Current recommendations remain appropriate. ? ?   ?Recommendations for follow up therapy are one component of a multi-disciplinary discharge planning process, led by the attending physician.  Recommendations may be updated based on patient status, additional functional criteria and insurance authorization. ? ?Follow Up Recommendations ? Home health PT ?  ?  ?Assistance Recommended at Discharge Intermittent Supervision/Assistance  ?Patient can return home with the following A little help with walking and/or transfers;A little help with bathing/dressing/bathroom;Assistance with cooking/housework;Assist for transportation;Help with stairs or ramp for entrance ?  ?Equipment Recommendations ? None recommended by PT  ?  ?Recommendations for Other Services   ? ? ?  ?Precautions / Restrictions Precautions ?Precautions: Fall ?Restrictions ?Weight Bearing Restrictions: No  ?  ? ?Mobility ? Bed Mobility ?Overal bed mobility: Modified Independent ?  ?  ?  ?  ?  ?  ?General bed mobility comments: Pt able to transition supine > sit EOB without assistance. ?   ? ?Transfers ?Overall transfer level: Needs assistance ?Equipment used: Straight cane ?Transfers: Sit to/from Stand ?Sit to Stand: Min guard ?  ?  ?  ?  ?  ?General transfer comment: Min guard for safety, no LOB but mild sway noted ?  ? ?Ambulation/Gait ?Ambulation/Gait assistance: Min guard ?Gait Distance (Feet): 210 Feet ?Assistive device: Straight cane ?Gait Pattern/deviations: Step-through pattern, Decreased stride length ?Gait velocity: decreased ?Gait velocity interpretation: <1.8 ft/sec, indicate of risk for recurrent falls ?  ?General Gait Details: Pt using SPC in L hand today with occasional lateral trunk sway/lateral drifting noted, but no LOB. Pt cued to change head positions to be aware of surroundings, again no LOB but mild slowing or change in gait noted. Min guard for safety. ? ? ?Stairs ?  ?  ?  ?  ?  ? ? ?Wheelchair Mobility ?  ? ?Modified Rankin (Stroke Patients Only) ?  ? ? ?  ?Balance Overall balance assessment: Needs assistance ?Sitting-balance support: No upper extremity supported, Feet supported ?Sitting balance-Leahy Scale: Good ?  ?  ?Standing balance support: Single extremity supported, No upper extremity supported, During functional activity ?Standing balance-Leahy Scale: Fair ?Standing balance comment: able to withstand narrow BOS static standing challenges without UE support, but relies on 1 UE support for gait ?  ?  ?Tandem Stance - Right Leg: 10 (seconds; more instability noted with R anterior to L than when L anterior to R) ?Tandem Stance - Left Leg: 10 (seconds; more instability noted with R anterior to L than when L anterior to R) ?Rhomberg - Eyes Opened: 15 (seconds) ?  ?  ?High Level Balance Comments: Pt standing with eyes open and no UE support in the following positions 1x each: Rhomberg (>15 sec), semi-tandem  bil (better stability when L anterior to R), tandem bil (better stability when L anterior to R) ?  ?  ?  ?  ? ?  ?Cognition Arousal/Alertness: Awake/alert ?Behavior  During Therapy: [facility] for tasks assessed/performed ?Overall Cognitive Status: Within Functional Limits for tasks assessed ?  ?  ?  ?  ?  ?  ?  ?  ?  ?  ?  ?  ?  ?  ?  ?  ?  ?  ?  ? ?  ?Exercises General Exercises - Lower Extremity ?Hip ABduction/ADduction: AROM, Strengthening, Both, 10 reps, Standing (with UEs on sink) ?Heel Raises: AROM, Strengthening, Both, 10 reps, Standing (with UEs on sink) ?Mini-Sqauts: AROM, Strengthening, Both, 10 reps, Standing (with UEs on sink) ?Other Exercises ?Other Exercises: Pt standing with eyes open and no UE support in the following positions 1x each: Rhomberg (>15 sec), semi-tandem bil (better stability when L anterior to R), tandem bil (better stability when L anterior to R) ? ?  ?General Comments General comments (skin integrity, edema, etc.): SpO2 decreasing to 87% on RA with standing exercises ?  ?  ? ?Pertinent Vitals/Pain Pain Assessment ?Pain Assessment: Faces ?Faces Pain Scale: Hurts a little bit ?Pain Location: back pain ?Pain Descriptors / Indicators: Guarding ?Pain Intervention(s): Limited activity within patient's tolerance, Monitored during session, Repositioned  ? ? ?Home Living   ?  ?  ?  ?  ?  ?  ?  ?  ?  ?   ?  ?Prior Function    ?  ?  ?   ? ?PT Goals (current goals can now be found in the care plan section) Acute Rehab PT Goals ?Patient Stated Goal: to improve ?PT Goal Formulation: With patient/family ?Time For Goal Achievement: 11/07/21 ?Potential to Achieve Goals: Good ?Progress towards PT goals: Progressing toward goals ? ?  ?Frequency ? ? ? Min 3X/week ? ? ? ?  ?PT Plan Current plan remains appropriate  ? ? ?Co-evaluation   ?  ?  ?  ?  ? ?  ?AM-PAC PT "6 Clicks" Mobility   ?Outcome Measure ? Help needed turning from your back to your side while in a flat bed without using bedrails?: None ?Help needed moving from lying on your back to sitting on the side of a flat bed without using bedrails?: None ?Help needed moving to and from a bed to a chair (including a  wheelchair)?: A Little ?Help needed standing up from a chair using your arms (e.g., wheelchair or bedside chair)?: A Little ?Help needed to walk in hospital room?: A Little ?Help needed climbing 3-5 steps with a railing? : A Little ?6 Click Score: 20 ? ?  ?End of Session Equipment Utilized During Treatment: Gait belt ?Activity Tolerance: Patient tolerated treatment well ?Patient left: with call bell/phone within reach;with family/visitor present;in chair ?Nurse Communication: Mobility status ?PT Visit Diagnosis: Muscle weakness (generalized) (M62.81);Unsteadiness on feet (R26.81);Pain;Other abnormalities of gait and mobility (R26.89);Difficulty in walking, not elsewhere classified (R26.2) ?Pain - Right/Left:  (back) ?Pain - part of body:  (back) ?  ? ? ?Time: 3532-9924 ?PT Time Calculation (min) (ACUTE ONLY): 22 min ? ?Charges:  $Therapeutic Exercise: 8-22 mins          ?          ? ?Moishe Spice, PT, DPT ?Acute Rehabilitation Services  ?Pager: [phone] ?Office: [phone] ? ? ? ?Maretta Bees Pettis ?10/30/2021, 4:11 PM ? ?

## 2021-10-30 NOTE — Consult Note (Signed)
? ?  [facility] CM Inpatient Consult ? ? ?10/30/2021 ? ?Leane Para ?Aug 17, 1965 ?[id number] ? ?[facility] [ACO] Patient: Katie Katie Medicare ? ?Primary Care Provider:  Billie Ruddy, MD, [facility], is an embedded provider with a Chronic Care Management team and program, and is listed for the transition of care follow up and appointments. ? ?Patient was screened for Embedded practice service needs for chronic care management.  Patient asleep on rounds ? ?Plan:Continue to follow for disposition and for Embedded Care Management provider needs. ? ?Please contact for further questions, ? ?Natividad Brood, RN BSN CCM ?[facility] Liaison ? [phone] business mobile phone ?Toll free office [phone]  ?Fax number: [phone] ?[email or web address] ? ? ? ?

## 2021-10-30 NOTE — Progress Notes (Signed)
? ?PROGRESS NOTE ? ? ? ?Katie Holland  GQQ:[id number] DOB: Sep 21, 1965 DOA: 10/22/2021 ?PCP: Billie Ruddy, MD ? ? ?Brief Narrative: ?The patient is a 56 year old African-American female with past medical history significant for but not limited to alcoholic liver failure, history of alcohol abuse, history of tobacco abuse, history of esophageal varices, history of upper GI bleed as well as other comorbidities who presented with worsening shortness of breath and increased O2 requirement.  She underwent an ultrasound-guided therapeutic paracentesis that yielded about 9.8 L of clear yellow fluid and had tolerated well and was administered 75 g of albumin.  Subsequently she is noted to be short of breath and had an elevated heart rate and high blood pressure even after paracentesis.  She is noted to be saturating 89% and increased work of bleeding and to be placed on 4 L of supplemental oxygen which improved her O2 saturations 96%.  There is attempt to wean her from her 4 L but because she failed to do so she was sent the ED for further evaluation.  She is noted to be somewhat more lethargic than usual and she was on home Lasix and spironolactone.  She also had some abdominal pain and does not drink anymore and her last drink was 3 months ago.  She continues to smoke cigarettes 6 to 7 cigarettes a day and think about quitting.  She had no bleeding and her last thoracentesis was 9 months ago she stated.  While in the ED she is noted to have a lactic acid of 2.3 and a chest x-ray that was worrisome for her right middle right lower lobe large right pleural effusion but also could be pneumonia versus CHF.  GI was notified of patient's admission and blood cultures and sputum cultures were obtained.  She underwent a CT of the chest which showed a right large sided pleural effusion with right middle lobe and right lower lobe consolidation as well as residual ascites in the abdomen following paracentesis.  She had cirrhotic  changes of the liver that were seen.  She is admitted for severe sepsis given that her lactic acid was level greater than 2 and that she had new oxygen requirement of 4 L.  She was placed on antibiotics azithromycin and ceftriaxone.  She is found to have 1 out of 4 bottles of E. coli bacteremia with possible pneumonia and she is continued on ceftriaxone and azithromycin.  IR was also consulted and she underwent a 1.8 L thoracentesis and there was question removed with there this was a hepatic hydrothorax.  She is continue on ceftriaxone and azithromycin and she did have large ascites that is refractory to high-dose diuretics.  GI evaluated and recommended resuming her home diuretic doses of Lasix 40 mg twice daily spironolactone 20 mg daily follow-up for a low sodium diet.  Patient was stressed to get a TIPS however this was canceled in the setting of her bacteremia ? ?GI has initiated her back on her home diuretics but if her renal function is okay there we will try increasing as she tolerates tomorrow.  They are recommending a discharge that she stay on Lasix 40 mg twice daily and spironolactone 200 g daily but they may increase it while she is in house.  Repeat chest x-ray yesterday showed a moderate pleural effusion again and today CXR showed "Large right pleural effusion, increased in size since the study from 1 day prior with worsened aeration of the right lung." ? ?Repeat CXR showed worsening  large right pleural effusion and because of her shortness of breath while ambulation a repeat thoracentesis was ordered.  GI recommending continuing diuretics and may increase the dose and recommending continuing low-sodium diet. ? ?She is s/p Thoracentesis again and 1 Liter was removed on 10/25/21.  GI has increased her diuretics to 40 mg of Lasix 3 times daily and 300 mg spironolactone daily however yesterday when I  walked in the room she was eating a large bag of Lays potato chips so I advised her about her salt  intake. ? ?10/27/21 she was actively vomiting and retching and did not feel as good. She is improved 10/28/21 but back on Supplemental O2 and complains of slight SOB.  GI will reach out to interventional radiology about timing of the TIPS and will continue antibiotics for 7 days for her E. coli bacteremia.  GI recommending continuing current dose of diuretics and not escalating dose yet ? ?10/28/21 Had worsening of he Pleural Effusion and had some dyspnea so Pulmonary was consulted who feels that she has a hepatic hydrothorax and recommending definitive management with TIPS but recommending another thoracentesis for comfort if she becomes more dyspenic.  GI spoken with IR and they will write a note outlining the plans regarding future TIPS.  Her antibiotics were switched yesterday from ceftriaxone to Ancef for 2 more day to complete 7 days of gram-negative coverage. ? ? ?Assessment and Plan: ? ?Severe sepsis ?Secondary to E. Coli bacteremia and pneumonia. Resolved with antibiotics. ? ?E. Coli bacteremia ?Patient treated with Ceftriaxone IV and transitioned to Cefazolin. Completed a 7 day course of antibiotics. Likely translocation from GI source. Resolved. ? ?Community acquired pneumonia ?Patient treated with Ceftriaxone and azithromycin. Completed course. ? ?Acute respiratory failure with hypoxia ?Secondary to pneumonia and pleural effusion from hepatic hydrothorax. Patient is s/p multiple thoracenteses. Weaned to room air. Resolved. ? ?Decompensated alcoholic cirrhosis ?Ascites ?GI on board. Patient is currently on high dose diuretic therapy. GI recommending TIPS. IR consulted for TIPS. Plan for TIPS on 5/12, prior to discharge. ?-Continue Lasix and Spironolactone ?-Continue lactulose ? ?Hyperammoniemia ?No note of hepatic encephalopathy, but patient developed asterixis. Lactulose therapy initiated. ? ?Elevated troponin ?Troponin peak of 234. Assessment is demand ischemia. ? ?Non thyroidal illness ?Likely diagnosis in  critically ill patient. Low TSH and T3 with normal free T4. Recommend repeat thyroid testing as an outpatient when no longer acutely ill. ? ?Microcytic anemia ?B12 Deficiency ?Chronic and stable hemoglobin. ?-Continue B12 deficiency ? ?Vitamin D deficiency ?Vitamin B6 deficiency ?Zinc deficiency ?-Continue vitamin D3, vitamin B6 and zine supplementation ? ?Nausea/vomiting ?Resolved. ? ?Thrombocytopenia ?Mild. In setting of liver disease. ? ?Lactic acidosis ?In setting of liver failure and sepsis.  ? ?Severe malnutrition ?In setting of chronic illness. Dietitian consulted.  ?Dietitian recommendations (5/10): ?Continue Multivitamin w/ minerals daily ?Continue Boost Plus po BID, provides 360 kcal and 14 grams of protein each. ?Encourage good PO intake  ? ?DVT prophylaxis: SCDs ?Code Status:   Code Status: DNR ?Family Communication: Husband at bedside ?Disposition Plan: Discharge likely in 3+ days pending GI, IR recommendations ? ? ?Consultants:  ?PCCM ?[facility] ?Interventional radiology ? ?Procedures:  ?Paracentesis (10/22/2021) ?Right thoracentesis (10/23/2021) ?Right thoracentesis (10/25/2021) ? ?Antimicrobials: ?Ceftriaxone IV ?Azithromycin IV ?Cefazolin IV  ? ? ?Subjective: ?Patient reports no issues overnight. No abdominal pain. Weaned to room air this morning. ? ?Objective: ?BP 104/63 (BP Location: Left Arm)   Pulse 92   Temp 98.3 ?F (36.8 ?C) (Oral)   Resp 20  Wt 57.5 kg   SpO2 94%   BMI 21.75 kg/m?  ? ?Examination: ? ?General exam: Appears calm and comfortable ?Respiratory system: Clear to auscultation. Respiratory effort normal. ?Cardiovascular system: S1 & S2 heard, RRR. No murmurs. ?Gastrointestinal system: Abdomen is significantly distended but very soft and nontender. Normal bowel sounds heard. ?Central nervous system: Alert and oriented. ?Musculoskeletal: No calf tenderness ?Skin: No cyanosis. No rashes ?Psychiatry: Judgement and insight appear normal. Mood & affect appropriate. Memory  appears to be impaired ? ? ?Data Reviewed: I have personally reviewed following labs and imaging studies ? ?CBC ?Lab Results  ?Component Value Date  ? WBC 5.4 10/30/2021  ? RBC 3.57 (L) 10/30/2021  ? HGB 8.5 (L) 05/10/202

## 2021-10-31 ENCOUNTER — Other Ambulatory Visit: Payer: Self-pay | Admitting: Radiology

## 2021-10-31 ENCOUNTER — Encounter (HOSPITAL_COMMUNITY): Payer: Self-pay | Admitting: Internal Medicine

## 2021-10-31 DIAGNOSIS — J948 Other specified pleural conditions: Secondary | ICD-10-CM | POA: Diagnosis not present

## 2021-10-31 DIAGNOSIS — J9601 Acute respiratory failure with hypoxia: Secondary | ICD-10-CM | POA: Diagnosis not present

## 2021-10-31 DIAGNOSIS — J189 Pneumonia, unspecified organism: Secondary | ICD-10-CM | POA: Diagnosis not present

## 2021-10-31 DIAGNOSIS — A419 Sepsis, unspecified organism: Secondary | ICD-10-CM | POA: Diagnosis not present

## 2021-10-31 DIAGNOSIS — K7031 Alcoholic cirrhosis of liver with ascites: Secondary | ICD-10-CM | POA: Diagnosis not present

## 2021-10-31 MED ORDER — DIPHENHYDRAMINE HCL 25 MG PO CAPS: 50.0000 mg | ORAL_CAPSULE | Freq: Once | ORAL | Status: DC

## 2021-10-31 MED ORDER — PREDNISONE 5 MG PO TABS
50.0000 mg | ORAL_TABLET | Freq: Once | ORAL | Status: AC
  Administered 2021-10-31: 50 mg via ORAL
  Filled 2021-10-31: qty 2

## 2021-10-31 MED ORDER — PREDNISONE 20 MG PO TABS: 50.0000 mg | ORAL_TABLET | Freq: Once | ORAL | Status: DC

## 2021-10-31 MED ORDER — PREDNISONE 5 MG PO TABS
50.0000 mg | ORAL_TABLET | Freq: Once | ORAL | Status: AC
  Administered 2021-11-01: 50 mg via ORAL
  Filled 2021-10-31: qty 2

## 2021-10-31 NOTE — Progress Notes (Signed)
? ?  NAME:  Katie Holland, MRN:  [id number], DOB:  12/01/1965, LOS: 9 ?ADMISSION DATE:  10/22/2021, CONSULTATION DATE: 10/29/2021 ?REFERRING MD: [facility], CHIEF COMPLAINT: Recurrent right pleural effusion ? ?History of Present Illness:  ?56 year old female with end-stage liver disease poorly compliant with treatment and has had 25 paracentesis in for thoracentesis over the last year.  Note her last paracentesis 10/22/2021 with 9.8 L of fluid obtained.  She also had a thoracentesis on 5 3 with 1.9 L and 5 5 with 1 L.  Notably her right pleural effusion has returned pulmonary critical care is asked to evaluate.  She has been evaluated for TIPS procedure in the near future.  She has had multiple interventional radiology procedures for banding in the past.  She is in no respiratory distress at this time.  She is very comfortable on 2 L nasal cannula.  She denies pain shortness of breath.  She is on a nicotine patch and  reported to smoke occasionally.  Her past medical history is well-documented below.  She be evaluated by pulmonary MD for questionable interventions. ? ?Pertinent  Medical History  ? ?Past Medical History:  ?Diagnosis Date  ? Alcoholic liver disease ([place])   ? Chronic back pain   ? on disability  ? Cirrhosis of liver ([place])   ? Esophageal varices ([facility])   ? GERD (gastroesophageal reflux disease)   ? Headache   ? Jaundice   ? ? ? ?Significant Hospital Events: ?Including procedures, antibiotic start and stop dates in addition to other pertinent events   ?See interventional radiology for list of procedures ?Interim History / Subjective:  ?No overnight issues. Worked with OT this morning. Ambulated and required 4LNC.  ? ?Objective   ?Blood pressure 100/64, pulse 98, temperature 98.7 ?F (37.1 ?C), temperature source Oral, resp. rate 20, height '5\' 4"'$  (1.626 m), weight 57.5 kg, SpO2 92 %. ?   ?   ? ?Intake/Output Summary (Last 24 hours) at 10/31/2021 1009 ?Last data filed at 10/30/2021 2200 ?Gross per 24 hour  ?Intake 480 ml   ?Output --  ?Net 480 ml  ? ?Filed Weights  ? 10/24/21 1520  ?Weight: 57.5 kg  ? ? ?Examination: ?Cachectic and appears older than stated age. ?Resting flat comfortably ?On nasal cannula ?No distress ?Abdomen distended but soft with fluid wave ? ?Resolved Hospital Problem list   ? ? 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 Coli bacteremia ?Tobacco Use disorder ? ?Noted plans for TIPS tomorrow. She may still need a thoracentesis after TIPS depending on residual fluid left  ? ?Will follow post procedure.  ? ? ?Lenice Llamas, MD ?Pulmonary and Critical Care Medicine ?[place] ?10/31/2021 10:09 AM ?Pager: see AMION ? ?If no response to pager, please call critical care on call (see AMION) until 7pm ?After 7:00 pm call Elink   ? ? ? ?

## 2021-10-31 NOTE — Progress Notes (Signed)
Occupational Therapy Treatment ?Patient Details ?Name: Katie Holland ?MRN: [id number] ?DOB: [birth year] ?Today's Date: 10/31/2021 ? ? ?History of present illness 56 y/o female presented to ED on 10/22/21 for SOB and chest tightness after she received paracentesis earlier that day. Admitted for sepsis 2/2 E. coli bacteremia and CAP. PMH: ETOH cirrhosis ?  ?OT comments ? Pt reports feeling well. Completing toileting and standing grooming with min guard assist and RW.   ? ?Recommendations for follow up therapy are one component of a multi-disciplinary discharge planning process, led by the attending physician.  Recommendations may be updated based on patient status, additional functional criteria and insurance authorization. ?   ?Follow Up Recommendations ? Home health OT  ?  ?Assistance Recommended at Discharge Set up Supervision/Assistance  ?Patient can return home with the following ? A little help with walking and/or transfers;A little help with bathing/dressing/bathroom ?  ?Equipment Recommendations ? Tub/shower bench  ?  ?Recommendations for Other Services   ? ?  ?Precautions / Restrictions Precautions ?Precautions: Fall ?Restrictions ?Weight Bearing Restrictions: No  ? ? ?  ? ?Mobility Bed Mobility ?Overal bed mobility: Modified Independent ?  ?  ?  ?  ?  ?  ?  ?  ? ?Transfers ?Overall transfer level: Needs assistance ?Equipment used: Rolling walker (2 wheels) ?  ?Sit to Stand: Min guard ?  ?  ?  ?  ?  ?  ?  ?  ?Balance Overall balance assessment: Needs assistance ?  ?Sitting balance-Leahy Scale: Good ?  ?  ?  ?Standing balance-Leahy Scale: Fair ?  ?  ?  ?  ?  ?  ?  ?  ?  ?  ?  ?  ?   ? ?ADL either performed or assessed with clinical judgement  ? ?ADL Overall ADL's : Needs assistance/impaired ?  ?  ?Grooming: Oral care;Wash/dry hands;Standing;Min guard ?  ?  ?  ?  ?  ?  ?  ?  ?  ?Toilet Transfer: Ambulation;Min guard;Rolling walker (2 wheels) ?  ?Toileting- Clothing Manipulation and Hygiene: Sit to/from stand;Min  guard ?  ?  ?  ?Functional mobility during ADLs: Rolling walker (2 wheels);Min guard ?  ?  ? ?Extremity/Trunk Assessment   ?  ?  ?  ?  ?  ? ?Vision   ?  ?  ?Perception   ?  ?Praxis   ?  ? ?Cognition Arousal/Alertness: Awake/alert ?Behavior During Therapy: [facility] for tasks assessed/performed ?Overall Cognitive Status: Within Functional Limits for tasks assessed ?  ?  ?  ?  ?  ?  ?  ?  ?  ?  ?  ?  ?  ?  ?  ?  ?  ?  ?  ?   ?Exercises   ? ?  ?Shoulder Instructions   ? ? ?  ?General Comments    ? ? ?Pertinent Vitals/ Pain       Pain Assessment ?Pain Assessment: No/denies pain ? ?Home Living   ?  ?  ?  ?  ?  ?  ?  ?  ?  ?  ?  ?  ?  ?  ?  ?  ?  ?  ? ?  ?Prior Functioning/Environment    ?  ?  ?  ?   ? ?Frequency ? Min 2X/week  ? ? ? ? ?  ?Progress Toward Goals ? ?OT Goals(current goals can now be found in the care plan section) ? Progress towards OT goals: Progressing toward  goals ? ?Acute Rehab OT Goals ?OT Goal Formulation: With patient ?Time For Goal Achievement: 11/07/21 ?Potential to Achieve Goals: Good  ?Plan Discharge plan remains appropriate   ? ?Co-evaluation ? ? ?   ?  ?  ?  ?  ? ?  ?AM-PAC OT "6 Clicks" Daily Activity     ?Outcome Measure ? ? Help from another person eating meals?: None ?Help from another person taking care of personal grooming?: A Little ?Help from another person toileting, which includes using toliet, bedpan, or urinal?: A Little ?Help from another person bathing (including washing, rinsing, drying)?: A Little ?Help from another person to put on and taking off regular upper body clothing?: None ?Help from another person to put on and taking off regular lower body clothing?: A Little ?6 Click Score: 20 ? ?  ?End of Session Equipment Utilized During Treatment: Rolling walker (2 wheels) ? ?OT Visit Diagnosis: Unsteadiness on feet (R26.81);Other abnormalities of gait and mobility (R26.89);Muscle weakness (generalized) (M62.81) ?  ?Activity Tolerance Patient tolerated treatment well ?  ?Patient Left in  bed;with call bell/phone within reach;with bed alarm set ?  ?Nurse Communication   ?  ? ?   ? ?Time: 7035-0093 ?OT Time Calculation (min): 14 min ? ?Charges: OT General Charges ?$OT Visit: 1 Visit ?OT Treatments ?$Self Care/Home Management : 8-22 mins ? ?Nestor Lewandowsky, OTR/L ?Acute Rehabilitation Services ?Pager: [phone] ?Office: [phone]  ? ?Malka So ?10/31/2021, 10:04 AM ?

## 2021-10-31 NOTE — H&P (Signed)
? ?Chief Complaint: ?Patient was seen in consultation today for recurrent ascites ? ?Referring Physician(s): ?Dr. Havery Moros ? ?Supervising Physician: Jacqulynn Cadet ? ?Patient Status: [facility] - In-pt ? ?History of Present Illness: ?Katie Holland is a 56 y.o. female with past medical history of alcohol abuse, cirrhosis with recurrent ascites known to IR from recurrent paracentesis who is followed by Dr. Vernard Gambles with plans for outpatient TIPS.  She had recently been stable with improvement in medical optimization when she presented to [facility] 10/22/21 for routine therapeutic paracentesis in respiratory distress.   She was referred to the ED due to hypoxia where she was found to have E coli bacteremia, possible pneumonia, and new large pleural effusion. She underwent thoracentesis x2 with recurrence in the setting of hydrothorax.  She has now completed antibiotics and is feeling significantly better. She is thought to be stable for TIPS procedure.  Plan made to proceed 11/01/21.  ? ?PA to bedside.  Patient is resting comfortably.  She has questions related to TIPS and expresses her relief that as of now we plan to proceed. Reviewed risks and benefits.  She is understanding of the goals of the procedure and is agreeable to proceed.  ? ?Past Medical History:  ?Diagnosis Date  ? Alcoholic liver disease ([place])   ? Chronic back pain   ? on disability  ? Cirrhosis of liver ([place])   ? Esophageal varices ([facility])   ? GERD (gastroesophageal reflux disease)   ? Headache   ? Jaundice   ? ? ?Past Surgical History:  ?Procedure Laterality Date  ? BACK SURGERY    ? L3-L4  ? BIOPSY  08/07/2020  ? Procedure: BIOPSY;  Surgeon: Yetta Flock, MD;  Location: [facility];  Service: Gastroenterology;;  ? COLONOSCOPY WITH PROPOFOL N/A 08/01/2019  ? Procedure: COLONOSCOPY WITH PROPOFOL;  Surgeon: Yetta Flock, MD;  Location: [facility] ENDOSCOPY;  Service: Gastroenterology;  Laterality: N/A;  ? ESOPHAGEAL BANDING  04/25/2018  ? Procedure:  ESOPHAGEAL BANDING;  Surgeon: Yetta Flock, MD;  Location: [facility] ENDOSCOPY;  Service: Gastroenterology;;  ? ESOPHAGEAL BANDING  04/27/2018  ? Procedure: ESOPHAGEAL BANDING;  Surgeon: Milus Banister, MD;  Location: [facility] ENDOSCOPY;  Service: Endoscopy;;  ? ESOPHAGEAL BANDING  06/02/2018  ? Procedure: ESOPHAGEAL BANDING;  Surgeon: Doran Stabler, MD;  Location: [place];  Service: Gastroenterology;;  ? ESOPHAGEAL BANDING N/A 07/07/2018  ? Procedure: ESOPHAGEAL BANDING;  Surgeon: Yetta Flock, MD;  Location: [facility];  Service: Gastroenterology;  Laterality: N/A;  ? ESOPHAGEAL BANDING  07/27/2018  ? Procedure: ESOPHAGEAL BANDING;  Surgeon: Yetta Flock, MD;  Location: [facility];  Service: Gastroenterology;;  ? ESOPHAGEAL BANDING  08/30/2018  ? Procedure: ESOPHAGEAL BANDING;  Surgeon: Yetta Flock, MD;  Location: [facility];  Service: Gastroenterology;;  ? ESOPHAGOGASTRODUODENOSCOPY N/A 04/27/2018  ? Procedure: ESOPHAGOGASTRODUODENOSCOPY (EGD);  Surgeon: Milus Banister, MD;  Location: [facility] ENDOSCOPY;  Service: Endoscopy;  Laterality: N/A;  ? ESOPHAGOGASTRODUODENOSCOPY (EGD) WITH PROPOFOL N/A 04/25/2018  ? Procedure: ESOPHAGOGASTRODUODENOSCOPY (EGD) WITH PROPOFOL;  Surgeon: Yetta Flock, MD;  Location: [place];  Service: Gastroenterology;  Laterality: N/A;  ? ESOPHAGOGASTRODUODENOSCOPY (EGD) WITH PROPOFOL N/A 06/02/2018  ? Procedure: ESOPHAGOGASTRODUODENOSCOPY (EGD) WITH PROPOFOL;  Surgeon: Doran Stabler, MD;  Location: [place];  Service: Gastroenterology;  Laterality: N/A;  ? ESOPHAGOGASTRODUODENOSCOPY (EGD) WITH PROPOFOL N/A 07/07/2018  ? Procedure: ESOPHAGOGASTRODUODENOSCOPY (EGD) WITH PROPOFOL;  Surgeon: Yetta Flock, MD;  Location: [facility] ENDOSCOPY;  Service: Gastroenterology;  Laterality: N/A;  ? ESOPHAGOGASTRODUODENOSCOPY (  EGD) WITH PROPOFOL N/A 07/27/2018  ? Procedure: ESOPHAGOGASTRODUODENOSCOPY (EGD) WITH PROPOFOL;  Surgeon: Yetta Flock, MD;   Location: [facility] ENDOSCOPY;  Service: Gastroenterology;  Laterality: N/A;  ? ESOPHAGOGASTRODUODENOSCOPY (EGD) WITH PROPOFOL N/A 08/30/2018  ? Procedure: ESOPHAGOGASTRODUODENOSCOPY (EGD) WITH PROPOFOL;  Surgeon: Yetta Flock, MD;  Location: [facility] ENDOSCOPY;  Service: Gastroenterology;  Laterality: N/A;  ? ESOPHAGOGASTRODUODENOSCOPY (EGD) WITH PROPOFOL N/A 11/08/2018  ? Procedure: ESOPHAGOGASTRODUODENOSCOPY (EGD) WITH PROPOFOL;  Surgeon: Yetta Flock, MD;  Location: [facility]OSCOPY;  Service: Gastroenterology;  Laterality: N/A;  ? ESOPHAGOGASTRODUODENOSCOPY (EGD) WITH PROPOFOL N/A 01/31/2019  ? Procedure: ESOPHAGOGASTRODUODENOSCOPY (EGD) WITH PROPOFOL;  Surgeon: Yetta Flock, MD;  Location: [facility] ENDOSCOPY;  Service: Gastroenterology;  Laterality: N/A;  ? ESOPHAGOGASTRODUODENOSCOPY (EGD) WITH PROPOFOL N/A 08/01/2019  ? Procedure: ESOPHAGOGASTRODUODENOSCOPY (EGD) WITH PROPOFOL;  Surgeon: Yetta Flock, MD;  Location: [facility] ENDOSCOPY;  Service: Gastroenterology;  Laterality: N/A;  ? ESOPHAGOGASTRODUODENOSCOPY (EGD) WITH PROPOFOL N/A 08/07/2020  ? Procedure: ESOPHAGOGASTRODUODENOSCOPY (EGD) WITH PROPOFOL;  Surgeon: Yetta Flock, MD;  Location: [facility] ENDOSCOPY;  Service: Gastroenterology;  Laterality: N/A;  ? ESOPHAGOGASTRODUODENOSCOPY (EGD) WITH PROPOFOL N/A 08/22/2021  ? Procedure: ESOPHAGOGASTRODUODENOSCOPY (EGD) WITH PROPOFOL;  Surgeon: Yetta Flock, MD;  Location: [facility] ENDOSCOPY;  Service: Gastroenterology;  Laterality: N/A;  ? IR PARACENTESIS  04/26/2018  ? IR PARACENTESIS  05/18/2018  ? IR PARACENTESIS  05/31/2018  ? IR PARACENTESIS  06/30/2018  ? IR PARACENTESIS  08/05/2018  ? IR PARACENTESIS  08/31/2018  ? IR PARACENTESIS  10/08/2018  ? IR PARACENTESIS  10/28/2018  ? IR PARACENTESIS  02/14/2019  ? IR PARACENTESIS  03/09/2019  ? IR PARACENTESIS  04/05/2019  ? IR PARACENTESIS  05/03/2019  ? IR PARACENTESIS  06/20/2019  ? IR PARACENTESIS  07/19/2019  ? IR PARACENTESIS  08/19/2019  ? IR PARACENTESIS  09/27/2019  ?  IR PARACENTESIS  10/07/2019  ? IR PARACENTESIS  10/28/2019  ? IR PARACENTESIS  11/16/2019  ? IR PARACENTESIS  12/30/2019  ? IR PARACENTESIS  01/16/2020  ? IR PARACENTESIS  02/07/2020  ? IR PARACENTESIS  03/06/2020  ? IR PARACENTESIS  04/04/2020  ? IR PARACENTESIS  04/17/2020  ? IR PARACENTESIS  05/04/2020  ? IR PARACENTESIS  06/07/2020  ? IR PARACENTESIS  06/28/2020  ? IR PARACENTESIS  07/27/2020  ? IR PARACENTESIS  08/20/2020  ? IR PARACENTESIS  09/13/2020  ? IR PARACENTESIS  10/04/2020  ? IR PARACENTESIS  10/25/2020  ? IR PARACENTESIS  11/07/2020  ? IR PARACENTESIS  11/30/2020  ? IR PARACENTESIS  12/13/2020  ? IR PARACENTESIS  01/04/2021  ? IR PARACENTESIS  01/25/2021  ? IR PARACENTESIS  02/20/2021  ? IR PARACENTESIS  03/20/2021  ? IR PARACENTESIS  04/18/2021  ? IR PARACENTESIS  05/14/2021  ? IR PARACENTESIS  06/10/2021  ? IR PARACENTESIS  06/27/2021  ? IR PARACENTESIS  07/17/2021  ? IR PARACENTESIS  08/12/2021  ? IR PARACENTESIS  08/23/2021  ? IR PARACENTESIS  09/13/2021  ? IR PARACENTESIS  10/02/2021  ? IR PARACENTESIS  10/22/2021  ? IR RADIOLOGIST EVAL & MGMT  12/23/2018  ? IR RADIOLOGIST EVAL & MGMT  10/11/2021  ? IR THORACENTESIS ASP PLEURAL SPACE W/IMG GUIDE  12/10/2018  ? IR THORACENTESIS ASP PLEURAL SPACE W/IMG GUIDE  01/21/2019  ? IR THORACENTESIS ASP PLEURAL SPACE W/IMG GUIDE  10/23/2021  ? IR THORACENTESIS ASP PLEURAL SPACE W/IMG GUIDE  10/25/2021  ? ? ?Allergies: ?Contrast media [iodinated contrast media], Latex, and Sulfa antibiotics ? ?Medications: ?Prior to Admission medications   ?Medication  Sig Start Date End Date Taking? Authorizing Provider  ?furosemide (LASIX) 20 MG tablet Take 2 tablets (40 mg total) by mouth 2 (two) times daily. 09/11/21 12/10/21 Yes Armbruster, Carlota Raspberry, MD  ?Multiple Vitamin (MULTIVITAMIN WITH MINERALS) TABS tablet Take 1 tablet by mouth daily.   Yes [provider]  ?prednisoLONE acetate (PRED FORTE) 1 % ophthalmic suspension Place 1 drop into both eyes 2 (two) times daily.   Yes [provider]   ?spironolactone (ALDACTONE) 100 MG tablet Take 2 tablets (200 mg total) by mouth once daily 09/11/21  Yes Armbruster, Carlota Raspberry, MD  ?  ? ?Family History  ?Problem Relation Age of Onset  ? Colon cancer Neg

## 2021-10-31 NOTE — Progress Notes (Signed)
? ?PROGRESS NOTE ? ? ? ?Katie Holland  HFW:[id number] DOB: [birth year] DOA: 10/22/2021 ?PCP: Billie Ruddy, MD ? ? ?Brief Narrative: ?The patient is a 56 year old African-American female with past medical history significant for but not limited to alcoholic liver failure, history of alcohol abuse, history of tobacco abuse, history of esophageal varices, history of upper GI bleed as well as other comorbidities who presented with worsening shortness of breath and increased O2 requirement.  She underwent an ultrasound-guided therapeutic paracentesis that yielded about 9.8 L of clear yellow fluid and had tolerated well and was administered 75 g of albumin.  Subsequently she is noted to be short of breath and had an elevated heart rate and high blood pressure even after paracentesis.  She is noted to be saturating 89% and increased work of bleeding and to be placed on 4 L of supplemental oxygen which improved her O2 saturations 96%.  There is attempt to wean her from her 4 L but because she failed to do so she was sent the ED for further evaluation.  She is noted to be somewhat more lethargic than usual and she was on home Lasix and spironolactone.  She also had some abdominal pain and does not drink anymore and her last drink was 3 months ago.  She continues to smoke cigarettes 6 to 7 cigarettes a day and think about quitting.  She had no bleeding and her last thoracentesis was 9 months ago she stated.  While in the ED she is noted to have a lactic acid of 2.3 and a chest x-ray that was worrisome for her right middle right lower lobe large right pleural effusion but also could be pneumonia versus CHF.  GI was notified of patient's admission and blood cultures and sputum cultures were obtained.  She underwent a CT of the chest which showed a right large sided pleural effusion with right middle lobe and right lower lobe consolidation as well as residual ascites in the abdomen following paracentesis.  She had cirrhotic  changes of the liver that were seen.  She is admitted for severe sepsis given that her lactic acid was level greater than 2 and that she had new oxygen requirement of 4 L.  She was placed on antibiotics azithromycin and ceftriaxone.  She is found to have 1 out of 4 bottles of E. coli bacteremia with possible pneumonia and she is continued on ceftriaxone and azithromycin.  IR was also consulted and she underwent a 1.8 L thoracentesis and there was question removed with there this was a hepatic hydrothorax.  She is continue on ceftriaxone and azithromycin and she did have large ascites that is refractory to high-dose diuretics.  GI evaluated and recommended resuming her home diuretic doses of Lasix 40 mg twice daily spironolactone 20 mg daily follow-up for a low sodium diet.  Patient was stressed to get a TIPS however this was canceled in the setting of her bacteremia ? ?GI has initiated her back on her home diuretics but if her renal function is okay there we will try increasing as she tolerates tomorrow.  They are recommending a discharge that she stay on Lasix 40 mg twice daily and spironolactone 200 g daily but they may increase it while she is in house.  Repeat chest x-ray yesterday showed a moderate pleural effusion again and today CXR showed "Large right pleural effusion, increased in size since the study from 1 day prior with worsened aeration of the right lung." ? ?Repeat CXR showed worsening  large right pleural effusion and because of her shortness of breath while ambulation a repeat thoracentesis was ordered.  GI recommending continuing diuretics and may increase the dose and recommending continuing low-sodium diet. ? ?She is s/p Thoracentesis again and 1 Liter was removed on 10/25/21.  GI has increased her diuretics to 40 mg of Lasix 3 times daily and 300 mg spironolactone daily however yesterday when I  walked in the room she was eating a large bag of L[employer] potato chips so I advised her about her salt  intake. ? ?10/27/21 she was actively vomiting and retching and did not feel as good. She is improved 10/28/21 but back on Supplemental O2 and complains of slight SOB.  GI will reach out to interventional radiology about timing of the TIPS and will continue antibiotics for 7 days for her E. coli bacteremia.  GI recommending continuing current dose of diuretics and not escalating dose yet ? ?10/28/21 Had worsening of he Pleural Effusion and had some dyspnea so Pulmonary was consulted who feels that she has a hepatic hydrothorax and recommending definitive management with TIPS but recommending another thoracentesis for comfort if she becomes more dyspenic.  GI spoken with IR and they will write a note outlining the plans regarding future TIPS.  Her antibiotics were switched from ceftriaxone to Ancef for 2 more day to complete 7 days of gram-negative coverage. ? ? ?Assessment and Plan: ? ?Severe sepsis ?Secondary to E. Coli bacteremia and pneumonia. Resolved with antibiotics. ? ?E. Coli bacteremia ?Patient treated with Ceftriaxone IV and transitioned to Cefazolin. Completed a 7 day course of antibiotics. Likely translocation from GI source. Resolved. ? ?Community acquired pneumonia ?Patient treated with Ceftriaxone and azithromycin. Completed course. ? ?Acute respiratory failure with hypoxia ?Secondary to pneumonia and pleural effusion from hepatic hydrothorax. Patient is s/p multiple thoracenteses. Weaned to room air. Resolved. ? ?Decompensated alcoholic cirrhosis ?Ascites ?Esophageal varices ?GI on board. Patient is currently on high dose diuretic therapy. GI recommending TIPS. IR consulted for TIPS. Plan for TIPS on 5/12, prior to discharge. ?-Continue Lasix and Spironolactone ?-Continue lactulose ? ?Hyperammoniemia ?No note of hepatic encephalopathy, but patient developed asterixis. Lactulose therapy initiated. ? ?Elevated troponin ?Troponin peak of 234. Assessment is demand ischemia. ? ?Non thyroidal illness ?Likely  diagnosis in critically ill patient. Low TSH and T3 with normal free T4. Recommend repeat thyroid testing as an outpatient when no longer acutely ill. ? ?Microcytic anemia ?B12 Deficiency ?Chronic and stable hemoglobin. ?-Continue B12 deficiency ? ?Vitamin D deficiency ?Vitamin B6 deficiency ?Zinc deficiency ?-Continue vitamin D3, vitamin B6 and zine supplementation ? ?Nausea/vomiting ?Resolved. ? ?Thrombocytopenia ?Mild. In setting of liver disease. ? ?Lactic acidosis ?In setting of liver failure and sepsis.  ? ?Severe malnutrition ?In setting of chronic illness. Dietitian consulted.  ?Dietitian recommendations (5/10): ?Continue Multivitamin w/ minerals daily ?Continue Boost Plus po BID, provides 360 kcal and 14 grams of protein each. ?Encourage good PO intake  ? ?DVT prophylaxis: SCDs ?Code Status:   Code Status: DNR ?Family Communication: None at bedside ?Disposition Plan: Discharge home with home health likely in 3+ days pending GI, IR, pulmonology recommendations ? ? ?Consultants:  ?PCCM ?[facility] ?Interventional radiology ? ?Procedures:  ?Paracentesis (10/22/2021) ?Right thoracentesis (10/23/2021) ?Right thoracentesis (10/25/2021) ? ?Antimicrobials: ?Ceftriaxone IV ?Azithromycin IV ?Cefazolin IV  ? ? ?Subjective: ?A lot of questions about TIPS. No issues overnight. Some right sided abdominal discomfort. Having about 2-3 loose stools per day. ? ?Objective: ?BP 101/67 (BP Location: Left Arm)   Pulse 87  Temp 98.2 ?F (36.8 ?C) (Oral)   Resp 19   Ht '5\' 4"'$  (1.626 m)   Wt 57.5 kg   SpO2 95%   BMI 21.75 kg/m?  ? ?Examination: ? ?General exam: Appears calm and comfortable ?Respiratory system: Diminished. Respiratory effort normal. ?Cardiovascular system: S1 & S2 heard, RRR. No murmurs, rubs, gallops or clicks. ?Gastrointestinal system: Abdomen is significantly distended but very soft and with mild right sided upper/lower quadrant tenderness. Normal bowel sounds heard. ?Central nervous system: Alert  and oriented. No focal neurological deficits. ?Musculoskeletal: No calf tenderness ?Skin: No cyanosis. No rashes ?Psychiatry: Judgement and insight appear normal. Mood & affect appropriate.  ? ? ?Data Reviewed: I have person

## 2021-11-01 ENCOUNTER — Inpatient Hospital Stay (HOSPITAL_COMMUNITY): Payer: Medicare HMO

## 2021-11-01 ENCOUNTER — Encounter (HOSPITAL_COMMUNITY)
Admission: EM | Disposition: A | Discharge status: Home / Self Care | Payer: Self-pay | Source: Home / Self Care | Attending: Family Medicine

## 2021-11-01 ENCOUNTER — Inpatient Hospital Stay (HOSPITAL_COMMUNITY): Payer: Medicare HMO | Admitting: Anesthesiology

## 2021-11-01 ENCOUNTER — Encounter (HOSPITAL_COMMUNITY): Payer: Self-pay | Admitting: Internal Medicine

## 2021-11-01 DIAGNOSIS — J9601 Acute respiratory failure with hypoxia: Secondary | ICD-10-CM | POA: Diagnosis not present

## 2021-11-01 DIAGNOSIS — K7460 Unspecified cirrhosis of liver: Secondary | ICD-10-CM

## 2021-11-01 DIAGNOSIS — K7031 Alcoholic cirrhosis of liver with ascites: Secondary | ICD-10-CM | POA: Diagnosis not present

## 2021-11-01 DIAGNOSIS — R188 Other ascites: Secondary | ICD-10-CM

## 2021-11-01 DIAGNOSIS — A419 Sepsis, unspecified organism: Secondary | ICD-10-CM | POA: Diagnosis not present

## 2021-11-01 DIAGNOSIS — J189 Pneumonia, unspecified organism: Secondary | ICD-10-CM | POA: Diagnosis not present

## 2021-11-01 HISTORY — PX: IR TIPS: IMG2295

## 2021-11-01 HISTORY — PX: IR US GUIDE VASC ACCESS RIGHT: IMG2390

## 2021-11-01 HISTORY — PX: RADIOLOGY WITH ANESTHESIA: SHX6223

## 2021-11-01 HISTORY — PX: IR PARACENTESIS: IMG2679

## 2021-11-01 HISTORY — PX: IR THORACENTESIS ASP PLEURAL SPACE W/IMG GUIDE: IMG5380

## 2021-11-01 LAB — CBC WITH DIFFERENTIAL/PLATELET
Abs Immature Granulocytes: 0 10*3/uL (ref 0.00–0.07)
Abs Immature Granulocytes: 0.05 10*3/uL (ref 0.00–0.07)
Basophils Absolute: 0 10*3/uL (ref 0.0–0.1)
Basophils Absolute: 0 10*3/uL (ref 0.0–0.1)
Basophils Relative: 0 %
Basophils Relative: 0 %
Eosinophils Absolute: 0 10*3/uL (ref 0.0–0.5)
Eosinophils Absolute: 0 10*3/uL (ref 0.0–0.5)
Eosinophils Relative: 0 %
Eosinophils Relative: 0 %
HCT: 28.4 % — ABNORMAL LOW (ref 36.0–46.0)
HCT: 31.7 % — ABNORMAL LOW (ref 36.0–46.0)
Hemoglobin: 9.2 g/dL — ABNORMAL LOW (ref 12.0–15.0)
Hemoglobin: 9.7 g/dL — ABNORMAL LOW (ref 12.0–15.0)
Immature Granulocytes: 0 %
Lymphocytes Relative: 5 %
Lymphocytes Relative: 5 %
Lymphs Abs: 0.2 10*3/uL — ABNORMAL LOW (ref 0.7–4.0)
Lymphs Abs: 0.6 10*3/uL — ABNORMAL LOW (ref 0.7–4.0)
MCH: 23.7 pg — ABNORMAL LOW (ref 26.0–34.0)
MCH: 23.5 pg — ABNORMAL LOW (ref 26.0–34.0)
MCHC: 32.4 g/dL (ref 30.0–36.0)
MCHC: 30.6 g/dL (ref 30.0–36.0)
MCV: 73 fL — ABNORMAL LOW (ref 80.0–100.0)
MCV: 76.8 fL — ABNORMAL LOW (ref 80.0–100.0)
Monocytes Absolute: 0 10*3/uL — ABNORMAL LOW (ref 0.1–1.0)
Monocytes Absolute: 0.3 10*3/uL (ref 0.1–1.0)
Monocytes Relative: 1 %
Monocytes Relative: 3 %
Neutro Abs: 4 10*3/uL (ref 1.7–7.7)
Neutro Abs: 10.6 10*3/uL — ABNORMAL HIGH (ref 1.7–7.7)
Neutrophils Relative %: 94 %
Neutrophils Relative %: 92 %
Platelets: 177 10*3/uL (ref 150–400)
Platelets: 144 10*3/uL — ABNORMAL LOW (ref 150–400)
RBC: 3.89 MIL/uL (ref 3.87–5.11)
RBC: 4.13 MIL/uL (ref 3.87–5.11)
RDW: 23.5 % — ABNORMAL HIGH (ref 11.5–15.5)
RDW: 23.9 % — ABNORMAL HIGH (ref 11.5–15.5)
WBC: 4.3 10*3/uL (ref 4.0–10.5)
WBC: 11.5 10*3/uL — ABNORMAL HIGH (ref 4.0–10.5)
nRBC: 0 % (ref 0.0–0.2)
nRBC: 0 /100 WBC
nRBC: 0 % (ref 0.0–0.2)

## 2021-11-01 LAB — COMPREHENSIVE METABOLIC PANEL
ALT: 22 U/L (ref 0–44)
AST: 37 U/L (ref 15–41)
Albumin: 2.6 g/dL — ABNORMAL LOW (ref 3.5–5.0)
Alkaline Phosphatase: 88 U/L (ref 38–126)
Anion gap: 5 (ref 5–15)
BUN: 19 mg/dL (ref 6–20)
CO2: 26 mmol/L (ref 22–32)
Calcium: 9.2 mg/dL (ref 8.9–10.3)
Chloride: 98 mmol/L (ref 98–111)
Creatinine, Ser: 0.84 mg/dL (ref 0.44–1.00)
GFR, Estimated: >60 mL/min (ref >60)
Glucose, Bld: 142 mg/dL — ABNORMAL HIGH (ref 70–99)
Potassium: 5.5 mmol/L — ABNORMAL HIGH (ref 3.5–5.1)
Sodium: 129 mmol/L — ABNORMAL LOW (ref 135–145)
Total Bilirubin: 1.2 mg/dL (ref 0.3–1.2)
Total Protein: 6 g/dL — ABNORMAL LOW (ref 6.5–8.1)

## 2021-11-01 LAB — PROTIME-INR
INR: 1.1 (ref 0.8–1.2)
Prothrombin Time: 14.2 seconds (ref 11.4–15.2)

## 2021-11-01 LAB — POTASSIUM: Potassium: 4.6 mmol/L (ref 3.5–5.1)

## 2021-11-01 SURGERY — IR WITH ANESTHESIA
Anesthesia: General

## 2021-11-01 MED ORDER — OXYCODONE HCL 5 MG PO TABS: 5.0000 mg | ORAL_TABLET | Freq: Once | ORAL | Status: DC | PRN

## 2021-11-01 MED ORDER — FENTANYL CITRATE (PF) 100 MCG/2ML IJ SOLN
INTRAMUSCULAR | Status: AC
  Filled 2021-11-01: qty 2

## 2021-11-01 MED ORDER — ONDANSETRON HCL 4 MG/2ML IJ SOLN: 4.0000 mg | Freq: Four times a day (QID) | INTRAMUSCULAR | Status: DC | PRN

## 2021-11-01 MED ORDER — SUGAMMADEX SODIUM 200 MG/2ML IV SOLN
INTRAVENOUS | Status: DC | PRN
  Administered 2021-11-01: 200 mg via INTRAVENOUS

## 2021-11-01 MED ORDER — ROCURONIUM BROMIDE 10 MG/ML (PF) SYRINGE
PREFILLED_SYRINGE | INTRAVENOUS | Status: DC | PRN
  Administered 2021-11-01 (×3): 50 mg via INTRAVENOUS

## 2021-11-01 MED ORDER — FENTANYL CITRATE (PF) 250 MCG/5ML IJ SOLN
INTRAMUSCULAR | Status: DC | PRN
  Administered 2021-11-01: 50 ug via INTRAVENOUS
  Administered 2021-11-01 (×2): 25 ug via INTRAVENOUS
  Administered 2021-11-01: 100 ug via INTRAVENOUS
  Administered 2021-11-01: 25 ug via INTRAVENOUS
  Administered 2021-11-01: 50 ug via INTRAVENOUS

## 2021-11-01 MED ORDER — IOHEXOL 300 MG/ML  SOLN
100.0000 mL | Freq: Once | INTRAMUSCULAR | Status: AC | PRN
  Administered 2021-11-01: 40 mL via INTRA_ARTERIAL

## 2021-11-01 MED ORDER — CISATRACURIUM BESYLATE (PF) 10 MG/5ML IV SOLN
INTRAVENOUS | Status: DC | PRN
  Administered 2021-11-01 (×2): 6 mg via INTRAVENOUS

## 2021-11-01 MED ORDER — PHENYLEPHRINE 80 MCG/ML (10ML) SYRINGE FOR IV PUSH (FOR BLOOD PRESSURE SUPPORT)
PREFILLED_SYRINGE | INTRAVENOUS | Status: DC | PRN
  Administered 2021-11-01 (×2): 160 ug via INTRAVENOUS
  Administered 2021-11-01: 80 ug via INTRAVENOUS

## 2021-11-01 MED ORDER — FENTANYL CITRATE (PF) 250 MCG/5ML IJ SOLN
INTRAMUSCULAR | Status: AC
  Filled 2021-11-01: qty 5

## 2021-11-01 MED ORDER — CEFTRIAXONE SODIUM 2 G IJ SOLR
INTRAMUSCULAR | Status: DC | PRN
  Administered 2021-11-01: 2 g via INTRAVENOUS

## 2021-11-01 MED ORDER — DEXAMETHASONE SODIUM PHOSPHATE 10 MG/ML IJ SOLN
INTRAMUSCULAR | Status: DC | PRN
  Administered 2021-11-01: 10 mg via INTRAVENOUS

## 2021-11-01 MED ORDER — FENTANYL CITRATE (PF) 100 MCG/2ML IJ SOLN
25.0000 ug | INTRAMUSCULAR | Status: DC | PRN
  Administered 2021-11-01: 25 ug via INTRAVENOUS

## 2021-11-01 MED ORDER — METHYLPREDNISOLONE SODIUM SUCC 125 MG IJ SOLR
INTRAMUSCULAR | Status: AC
  Filled 2021-11-01: qty 2

## 2021-11-01 MED ORDER — PHENYLEPHRINE HCL-NACL 20-0.9 MG/250ML-% IV SOLN
INTRAVENOUS | Status: DC | PRN
  Administered 2021-11-01: 20 ug/min via INTRAVENOUS

## 2021-11-01 MED ORDER — ORAL CARE MOUTH RINSE: 15.0000 mL | Freq: Once | OROMUCOSAL | Status: AC

## 2021-11-01 MED ORDER — SODIUM CHLORIDE 0.9 % IV SOLN
INTRAVENOUS | Status: AC
  Filled 2021-11-01: qty 20

## 2021-11-01 MED ORDER — CHLORHEXIDINE GLUCONATE 0.12 % MT SOLN
15.0000 mL | Freq: Once | OROMUCOSAL | Status: AC
  Administered 2021-11-01: 15 mL via OROMUCOSAL
  Filled 2021-11-01: qty 15

## 2021-11-01 MED ORDER — MIDAZOLAM HCL 2 MG/2ML IJ SOLN
INTRAMUSCULAR | Status: AC
  Filled 2021-11-01: qty 2

## 2021-11-01 MED ORDER — DIPHENHYDRAMINE HCL 50 MG/ML IJ SOLN
INTRAMUSCULAR | Status: AC
  Filled 2021-11-01: qty 1

## 2021-11-01 MED ORDER — PROPOFOL 10 MG/ML IV BOLUS
INTRAVENOUS | Status: AC
  Filled 2021-11-01: qty 20

## 2021-11-01 MED ORDER — LIDOCAINE 2% (20 MG/ML) 5 ML SYRINGE
INTRAMUSCULAR | Status: DC | PRN
  Administered 2021-11-01: 40 mg via INTRAVENOUS

## 2021-11-01 MED ORDER — OXYCODONE HCL 5 MG/5ML PO SOLN: 5.0000 mg | Freq: Once | ORAL | Status: DC | PRN

## 2021-11-01 MED ORDER — IOHEXOL 300 MG/ML  SOLN
100.0000 mL | Freq: Once | INTRAMUSCULAR | Status: AC | PRN
  Administered 2021-11-01: 30 mL via INTRA_ARTERIAL

## 2021-11-01 MED ORDER — LACTATED RINGERS IV SOLN: INTRAVENOUS | Status: DC

## 2021-11-01 MED ORDER — METHYLPREDNISOLONE SODIUM SUCC 125 MG IJ SOLR
INTRAMUSCULAR | Status: DC | PRN
  Administered 2021-11-01: 125 mg via INTRAVENOUS

## 2021-11-01 MED ORDER — MIDAZOLAM HCL 2 MG/2ML IJ SOLN
INTRAMUSCULAR | Status: DC | PRN
  Administered 2021-11-01 (×2): 1 mg via INTRAVENOUS

## 2021-11-01 MED ORDER — ALBUMIN HUMAN 5 % IV SOLN: INTRAVENOUS | Status: DC | PRN

## 2021-11-01 MED ORDER — DIPHENHYDRAMINE HCL 50 MG/ML IJ SOLN
INTRAMUSCULAR | Status: DC | PRN
  Administered 2021-11-01: 25 mg via INTRAVENOUS

## 2021-11-01 MED ORDER — PROPOFOL 10 MG/ML IV BOLUS
INTRAVENOUS | Status: DC | PRN
  Administered 2021-11-01: 70 mg via INTRAVENOUS

## 2021-11-01 MED ORDER — ONDANSETRON HCL 4 MG/2ML IJ SOLN
INTRAMUSCULAR | Status: DC | PRN
  Administered 2021-11-01: 4 mg via INTRAVENOUS

## 2021-11-01 NOTE — Transfer of Care (Signed)
Immediate Anesthesia Transfer of Care Note ? ?Patient: Katie Holland ? ?Procedure(s) Performed: TIPS ? ?Patient Location: PACU ? ?Anesthesia Type:General ? ?Level of Consciousness: drowsy ? ?Airway & Oxygen Therapy: Patient Spontanous Breathing and Patient connected to nasal cannula oxygen ? ?Post-op Assessment: Report given to RN and Post -op Vital signs reviewed and stable ? ?Post vital signs: Reviewed and stable ? ?Last Vitals:  ?Vitals Value Taken Time  ?BP    ?Temp 36.8 ?C 11/01/21 1430  ?Pulse 93 11/01/21 1435  ?Resp 24 11/01/21 1435  ?SpO2 100 % 11/01/21 1435  ?Vitals shown include unvalidated device data. ? ?Last Pain:  ?Vitals:  ? 11/01/21 0818  ?TempSrc:   ?PainSc: 0-No pain  ?   ? ?Patients Stated Pain Goal: 2 (10/31/21 1635) ? ?Complications: No notable events documented. ?

## 2021-11-01 NOTE — Anesthesia Procedure Notes (Signed)
Procedure Name: Intubation ?Date/Time: 11/01/2021 9:18 AM ?Performed by: Vonna Drafts, CRNA ?Pre-anesthesia Checklist: Patient identified, Emergency Drugs available, Suction available and Patient being monitored ?Patient Re-evaluated:Patient Re-evaluated prior to induction ?Oxygen Delivery Method: Circle system utilized ?Preoxygenation: Pre-oxygenation with 100% oxygen ?Induction Type: IV induction ?Ventilation: Mask ventilation without difficulty ?Laryngoscope Size: Mac and 4 ?Grade View: Grade I ?Tube type: Oral ?Tube size: 7.0 mm ?Number of attempts: 1 ?Airway Equipment and Method: Stylet and Oral airway ?Placement Confirmation: ETT inserted through vocal cords under direct vision, positive ETCO2 and breath sounds checked- equal and bilateral ?Secured at: 22 cm ?Tube secured with: Tape ?Dental Injury: Teeth and Oropharynx as per pre-operative assessment  ? ? ? ? ?

## 2021-11-01 NOTE — Procedures (Signed)
Interventional Radiology Procedure Note ? ?Procedure:  ? ?1.) Thoracentesis ?2.) Paracentesis ?3.) Successful creation of a MHV to RPV TIPS.  Pre-TIPS PSG 20 mmHg, post TIPS PSG 5 mmHg.  ? ?Complications: None ? ?Estimated Blood Loss: None ? ?Recommendations: ?- Lactulose TID ?- Labs and CXR in the am ?  ? ?Signed, ? ?Criselda Peaches, MD ? ? ?

## 2021-11-01 NOTE — H&P (Signed)
?  HPI: ? ?The patient has had a H&P performed within the last 30 days, all history, medications, and exam have been reviewed. The patient denies any interval changes since the H&P. ? ?Medications: ?Prior to Admission medications   ?Medication Sig Start Date End Date Taking? Authorizing Provider  ?furosemide (LASIX) 20 MG tablet Take 2 tablets (40 mg total) by mouth 2 (two) times daily. 09/11/21 12/10/21 Yes Armbruster, Carlota Raspberry, MD  ?Multiple Vitamin (MULTIVITAMIN WITH MINERALS) TABS tablet Take 1 tablet by mouth daily.   Yes [provider]  ?prednisoLONE acetate (PRED FORTE) 1 % ophthalmic suspension Place 1 drop into both eyes 2 (two) times daily.   Yes [provider]  ?spironolactone (ALDACTONE) 100 MG tablet Take 2 tablets (200 mg total) by mouth once daily 09/11/21  Yes Armbruster, Carlota Raspberry, MD  ?  ? ?Vital Signs: ?BP 102/63   Pulse 85   Temp 98.4 ?F (36.9 ?C)   Resp 18   Ht '5\' 4"'$  (1.626 m)   Wt 126 lb 11.2 oz (57.5 kg)   SpO2 96%   BMI 21.75 kg/m?  ? ?Physical Exam ?Constitutional:   ?   General: She is not in acute distress. ?Cardiovascular:  ?   Rate and Rhythm: Normal rate.  ?Pulmonary:  ?   Effort: Pulmonary effort is normal.  ?Abdominal:  ?   General: There is distension.  ?Neurological:  ?   General: No focal deficit present.  ?   Mental Status: She is alert and oriented to person, place, and time.  ?Psychiatric:     ?   Mood and Affect: Mood normal.     ?   Behavior: Behavior normal.  ? ? ?Mallampati Score: ? ?MD Evaluation ?Airway: WNL ?Heart: WNL ?Abdomen: WNL ?Chest/ Lungs: WNL ?ASA  Classification: 3 ?Mallampati/Airway Score: Two ? ?Labs: ? ?CBC: ?Recent Labs  ?  10/28/21 ?4166 10/29/21 ?0356 10/30/21 ?0630 11/01/21 ?1601  ?WBC 7.2 6.4 5.4 4.3  ?HGB 9.3* 8.6* 8.5* 9.2*  ?HCT 29.2* 25.9* 25.8* 28.4*  ?PLT 145* 145* 148* 177  ? ? ?COAGS: ?Recent Labs  ?  04/04/21 ?1449 10/22/21 ?1451 10/24/21 ?0750 11/01/21 ?0932  ?INR 1.3* 1.6* 1.4* 1.1  ? ? ?BMP: ?Recent Labs  ?   10/28/21 ?3557 10/29/21 ?0356 10/30/21 ?3220 11/01/21 ?2542  ?NA 136 134* 135 129*  ?K 4.1 4.2 4.3 5.5*  ?CL 104 103 103 98  ?CO2 '26 27 28 26  '$ ?GLUCOSE 104* 94 128* 142*  ?BUN '9 10 10 19  '$ ?CALCIUM 8.7* 8.6* 8.8* 9.2  ?CREATININE 0.90 0.93 0.79 0.84  ?GFRNONAA >60 >60 >60 >60  ? ? ?LIVER FUNCTION TESTS: ?Recent Labs  ?  10/28/21 ?7062 10/29/21 ?0356 10/30/21 ?3762 11/01/21 ?8315  ?BILITOT 1.0 0.7 0.7 1.2  ?AST 32 29 32 37  ?ALT '21 20 18 22  '$ ?ALKPHOS 74 88 105 88  ?PROT 5.5* 5.2* 5.4* 6.0*  ?ALBUMIN 2.4* 2.3* 2.3* 2.6*  ? ? ?Assessment/Plan:  ?Alcoholic cirrhosis, recurrent ascites ?OK to proceed with inpatient TIPS in IR with anesthesia.  ?MELDNa 14 ?Discussed with patient who is agreeable to proceed.  ?She is also consented for paracentesis, possible thoracentesis if needed with procedure.  ? ?Signed: ?Alon Mazor ?11/01/2021, 9:22 AM ?  ?

## 2021-11-01 NOTE — Anesthesia Preprocedure Evaluation (Signed)
Anesthesia Evaluation  ?Patient identified by MRN, date of birth, ID band ?Patient awake ? ? ? ?Reviewed: ?Allergy & Precautions, H&P , NPO status , Patient's Chart, lab work & pertinent test results ? ?Airway ?Mallampati: II ? ? ?Neck ROM: full ? ? ? Dental ?  ?Pulmonary ?Current Smoker and Patient abstained from smoking.,  ?  ?breath sounds clear to auscultation ? ? ? ? ? ? Cardiovascular ?negative cardio ROS ? ? ?Rhythm:regular Rate:Normal ? ? ?  ?Neuro/Psych ? Headaches,   ? GI/Hepatic ?GERD  ,(+) Cirrhosis  ?  ?  ? , Esophageal varices ?  ?Endo/Other  ? ? Renal/GU ?  ? ?  ?Musculoskeletal ? ? Abdominal ?  ?Peds ? Hematology ?  ?Anesthesia Other Findings ? ? Reproductive/Obstetrics ? ?  ? ? ? ? ? ? ? ? ? ? ? ? ? ?  ?  ? ? ? ? ? ? ? ? ?Anesthesia Physical ?Anesthesia Plan ? ?ASA: 3 ? ?Anesthesia Plan: General  ? ?Post-op Pain Management:   ? ?Induction: Intravenous ? ?PONV Risk Score and Plan: 2 and Ondansetron, Dexamethasone and Treatment may vary due to age or medical condition ? ?Airway Management Planned: Oral ETT ? ?Additional Equipment:  ? ?Intra-op Plan:  ? ?Post-operative Plan: Extubation in OR ? ?Informed Consent: I have reviewed the patients History and Physical, chart, labs and discussed the procedure including the risks, benefits and alternatives for the proposed anesthesia with the patient or authorized representative who has indicated his/her understanding and acceptance.  ? ? ? ?Dental advisory given ? ?Plan Discussed with: Anesthesiologist, Surgeon and CRNA ? ?Anesthesia Plan Comments:   ? ? ? ? ? ? ?Anesthesia Quick Evaluation ? ?

## 2021-11-01 NOTE — Progress Notes (Signed)
PT Cancellation Note ? ?Patient Details ?Name: Katie Holland ?MRN: [id number] ?DOB: 1966/01/24 ? ? ?Cancelled Treatment:    Reason Eval/Treat Not Completed: Patient at procedure or test/unavailable (Pt still in IR. Will most likely return at later date.) ? ? ?[name] F Alwin Lanigan ?11/01/2021, 11:50 AM ?Arrie Aran M,PT ?Acute Rehab Services ?[phone] ?[phone] (pager)  ? ? ?

## 2021-11-01 NOTE — Progress Notes (Signed)
PT Cancellation Note ? ?Patient Details ?Name: Katie Holland ?MRN: [id number] ?DOB: [birth year] ? ? ?Cancelled Treatment:    Reason Eval/Treat Not Completed: Patient at procedure or test/unavailable (Pt in IR for TIPS procedure.  Will return as able.) ? ? ?Katie Holland ?11/01/2021, 8:53 AM ?Katie Holland,PT ?Acute Rehab Services ?[phone] ?[phone] (pager)  ? ? ?

## 2021-11-01 NOTE — TOC Progression Note (Signed)
Transition of Care (TOC) - Progression Note  ? ? ?Patient Details  ?Name: Katie Holland ?MRN: [id number] ?Date of Birth: 01/09/1966 ? ?Transition of Care (TOC) CM/SW Contact  ?Verdell Carmine, RN ?Phone Number: ?11/01/2021, 11:53 AM ? ?Clinical Narrative:    ?Patient is having TIPS today. Is set up with DME and [facility] home [facility]. Need  to call [facility] when DC ? ? ?Expected Discharge Plan: [place] ?Barriers to Discharge: Continued Medical Work up ? ?Expected Discharge Plan and Services ?Expected Discharge Plan: [place] ?  ?Discharge Planning Services: CM Consult ?Post Acute Care Choice: Durable Medical Equipment, Home Health ?Living arrangements for the past 2 months: [place] ?                ?DME Arranged: 3-N-1, Tub bench ?DME Agency: [facility] ?Date DME Agency Contacted: 10/25/21 ?Time DME Agency Contacted: [id number] ?Representative spoke with at DME Agency: [place] ?HH Arranged: PT, OT ?[place] Agency:  ([facility]) ?Date HH Agency Contacted: 10/25/21 ?Time [place]: 1130 ?Representative spoke with at [place] ? ? ?Social Determinants of Health (SDOH) Interventions ?  ? ?Readmission Risk Interventions ?   ? View : No data to display.  ?  ?  ?  ? ? ?

## 2021-11-01 NOTE — Progress Notes (Addendum)
? ?PROGRESS NOTE ? ? ? ?Katie Holland  QIO:[id number] DOB: August 12, 1965 DOA: 10/22/2021 ?PCP: Billie Ruddy, MD ? ? ?Brief Narrative: ?The patient is a 56 year old African-American female with past medical history significant for but not limited to alcoholic liver failure, history of alcohol abuse, history of tobacco abuse, history of esophageal varices, history of upper GI bleed as well as other comorbidities who presented with worsening shortness of breath and increased O2 requirement.  She underwent an ultrasound-guided therapeutic paracentesis that yielded about 9.8 L of clear yellow fluid and had tolerated well and was administered 75 g of albumin.  Subsequently she is noted to be short of breath and had an elevated heart rate and high blood pressure even after paracentesis.  She is noted to be saturating 89% and increased work of bleeding and to be placed on 4 L of supplemental oxygen which improved her O2 saturations 96%.  There is attempt to wean her from her 4 L but because she failed to do so she was sent the ED for further evaluation.  She is noted to be somewhat more lethargic than usual and she was on home Lasix and spironolactone.  She also had some abdominal pain and does not drink anymore and her last drink was 3 months ago.  She continues to smoke cigarettes 6 to 7 cigarettes a day and think about quitting.  She had no bleeding and her last thoracentesis was 9 months ago she stated.  While in the ED she is noted to have a lactic acid of 2.3 and a chest x-ray that was worrisome for her right middle right lower lobe large right pleural effusion but also could be pneumonia versus CHF.  GI was notified of patient's admission and blood cultures and sputum cultures were obtained.  She underwent a CT of the chest which showed a right large sided pleural effusion with right middle lobe and right lower lobe consolidation as well as residual ascites in the abdomen following paracentesis.  She had cirrhotic  changes of the liver that were seen.  She is admitted for severe sepsis given that her lactic acid was level greater than 2 and that she had new oxygen requirement of 4 L.  She was placed on antibiotics azithromycin and ceftriaxone.  She is found to have 1 out of 4 bottles of E. coli bacteremia with possible pneumonia and she is continued on ceftriaxone and azithromycin.  IR was also consulted and she underwent a 1.8 L thoracentesis and there was question removed with there this was a hepatic hydrothorax.  She is continue on ceftriaxone and azithromycin and she did have large ascites that is refractory to high-dose diuretics.  GI evaluated and recommended resuming her home diuretic doses of Lasix 40 mg twice daily spironolactone 20 mg daily follow-up for a low sodium diet.  Patient was stressed to get a TIPS however this was canceled in the setting of her bacteremia ? ?GI has initiated her back on her home diuretics but if her renal function is okay there we will try increasing as she tolerates tomorrow.  They are recommending a discharge that she stay on Lasix 40 mg twice daily and spironolactone 200 g daily but they may increase it while she is in house.  Repeat chest x-ray yesterday showed a moderate pleural effusion again and today CXR showed "Large right pleural effusion, increased in size since the study from 1 day prior with worsened aeration of the right lung." ? ?Repeat CXR showed worsening  large right pleural effusion and because of her shortness of breath while ambulation a repeat thoracentesis was ordered.  GI recommending continuing diuretics and may increase the dose and recommending continuing low-sodium diet. ? ?She is s/p Thoracentesis again and 1 Liter was removed on 10/25/21.  GI has increased her diuretics to 40 mg of Lasix 3 times daily and 300 mg spironolactone daily however yesterday when I  walked in the room she was eating a large bag of Lays potato chips so I advised her about her salt  intake. ? ?10/27/21 she was actively vomiting and retching and did not feel as good. She is improved 10/28/21 but back on Supplemental O2 and complains of slight SOB.  GI will reach out to interventional radiology about timing of the TIPS and will continue antibiotics for 7 days for her E. coli bacteremia.  GI recommending continuing current dose of diuretics and not escalating dose yet ? ?10/28/21 Had worsening of he Pleural Effusion and had some dyspnea so Pulmonary was consulted who feels that she has a hepatic hydrothorax and recommending definitive management with TIPS but recommending another thoracentesis for comfort if she becomes more dyspenic.  GI spoken with IR and they will write a note outlining the plans regarding future TIPS.  Her antibiotics were switched from ceftriaxone to Ancef for 2 more day to complete 7 days of gram-negative coverage. ? ? ?Assessment and Plan: ? ?Severe sepsis ?Secondary to E. Coli bacteremia and pneumonia. Resolved with antibiotics. ? ?E. Coli bacteremia ?Patient treated with Ceftriaxone IV and transitioned to Cefazolin. Completed a 7 day course of antibiotics. Likely translocation from GI source. Resolved. ? ?Community acquired pneumonia ?Patient treated with Ceftriaxone and azithromycin. Completed course. ? ?Acute respiratory failure with hypoxia ?Secondary to pneumonia and pleural effusion from hepatic hydrothorax. Patient is s/p multiple thoracenteses. Weaned to room air. Resolved. ? ?Decompensated alcoholic cirrhosis ?Ascites ?Esophageal varices ?GI on board. Patient is currently on high dose diuretic therapy. GI recommending TIPS. IR consulted for TIPS. Plan for TIPS on 5/12 (today), prior to discharge. ?-Continue Lasix and Spironolactone ?-Continue lactulose ? ?Hyperammoniemia ?No note of hepatic encephalopathy, but patient developed asterixis. Lactulose therapy initiated. ? ?Elevated troponin ?Troponin peak of 234. Assessment is demand ischemia. ? ?Non thyroidal  illness ?Likely diagnosis in critically ill patient. Low TSH and T3 with normal free T4. Recommend repeat thyroid testing as an outpatient when no longer acutely ill. ? ?Microcytic anemia ?B12 Deficiency ?Chronic and stable hemoglobin. ?-Continue B12 supplementation ? ?Vitamin D deficiency ?Vitamin B6 deficiency ?Zinc deficiency ?-Continue vitamin D3, vitamin B6 and zine supplementation ? ?Nausea/vomiting ?Resolved. ? ?Thrombocytopenia ?Mild. In setting of liver disease. ? ?Lactic acidosis ?In setting of liver failure and sepsis.  ? ?Severe malnutrition ?In setting of chronic illness. Dietitian consulted.  ?Dietitian recommendations (5/10): ?Continue Multivitamin w/ minerals daily ?Continue Boost Plus po BID, provides 360 kcal and 14 grams of protein each. ?Encourage good PO intake  ? ?DVT prophylaxis: SCDs ?Code Status:   Code Status: DNR ?Family Communication: None at bedside ?Disposition Plan: Discharge home with home health likely in 2-3 days pending GI, IR, pulmonology recommendations ? ? ?Consultants:  ?PCCM ?[facility] ?Interventional radiology ? ?Procedures:  ?Paracentesis (10/22/2021) ?Right thoracentesis (10/23/2021) ?Right thoracentesis (10/25/2021) ? ?Antimicrobials: ?Ceftriaxone IV ?Azithromycin IV ?Cefazolin IV  ? ? ?Subjective: ?Some abdominal pain but no other issues. ? ?Objective: ?BP 102/63   Pulse 85   Temp 98.4 ?F (36.9 ?C)   Resp 18   Ht '5\' 4"'$  (1.626 m)  Wt 57.5 kg   SpO2 96%   BMI 21.75 kg/m?  ? ?Examination: ? ?General exam: Appears calm and comfortable ?Respiratory system: Clear to auscultation. Respiratory effort normal. ?Cardiovascular system: S1 & S2 heard, RRR. No murmurs. ?Gastrointestinal system: Abdomen is distended, soft and tender in right quadrants. Normal bowel sounds heard. ?Central nervous system: Alert and oriented. No focal neurological deficits. ?Musculoskeletal: No edema. No calf tenderness ?Skin: No cyanosis. No rashes ?Psychiatry: Judgement and insight  appear normal. Mood & affect appropriate.  ? ? ?Data Reviewed: I have personally reviewed following labs and imaging studies ? ?CBC ?Lab Results  ?Component Value Date  ? WBC 4.3 11/01/2021  ? RBC 3.89 11/01/2021  ? HGB 9.2 (L) 11/02/18

## 2021-11-01 NOTE — Progress Notes (Signed)
PRE TIPS ?RA 10 ?PV 30 ?Gradient 20 ? ?POST TIPS ?RA 18 ?PV 23 ?Gradient 5 ?

## 2021-11-02 ENCOUNTER — Inpatient Hospital Stay (HOSPITAL_COMMUNITY): Payer: Medicare HMO

## 2021-11-02 DIAGNOSIS — J9601 Acute respiratory failure with hypoxia: Secondary | ICD-10-CM | POA: Diagnosis not present

## 2021-11-02 DIAGNOSIS — K7031 Alcoholic cirrhosis of liver with ascites: Secondary | ICD-10-CM | POA: Diagnosis not present

## 2021-11-02 DIAGNOSIS — J90 Pleural effusion, not elsewhere classified: Secondary | ICD-10-CM | POA: Diagnosis not present

## 2021-11-02 DIAGNOSIS — A419 Sepsis, unspecified organism: Secondary | ICD-10-CM | POA: Diagnosis not present

## 2021-11-02 DIAGNOSIS — I8500 Esophageal varices without bleeding: Secondary | ICD-10-CM | POA: Diagnosis not present

## 2021-11-02 DIAGNOSIS — J948 Other specified pleural conditions: Secondary | ICD-10-CM | POA: Diagnosis not present

## 2021-11-02 DIAGNOSIS — J189 Pneumonia, unspecified organism: Secondary | ICD-10-CM | POA: Diagnosis not present

## 2021-11-02 LAB — CBC
HCT: 30.8 % — ABNORMAL LOW (ref 36.0–46.0)
Hemoglobin: 10 g/dL — ABNORMAL LOW (ref 12.0–15.0)
MCH: 24 pg — ABNORMAL LOW (ref 26.0–34.0)
MCHC: 32.5 g/dL (ref 30.0–36.0)
MCV: 73.9 fL — ABNORMAL LOW (ref 80.0–100.0)
Platelets: 141 10*3/uL — ABNORMAL LOW (ref 150–400)
RBC: 4.17 MIL/uL (ref 3.87–5.11)
RDW: 23.5 % — ABNORMAL HIGH (ref 11.5–15.5)
WBC: 11.4 10*3/uL — ABNORMAL HIGH (ref 4.0–10.5)
nRBC: 0 % (ref 0.0–0.2)

## 2021-11-02 LAB — COMPREHENSIVE METABOLIC PANEL
ALT: 34 U/L (ref 0–44)
AST: 59 U/L — ABNORMAL HIGH (ref 15–41)
Albumin: 3 g/dL — ABNORMAL LOW (ref 3.5–5.0)
Alkaline Phosphatase: 87 U/L (ref 38–126)
Anion gap: 8 (ref 5–15)
BUN: 23 mg/dL — ABNORMAL HIGH (ref 6–20)
CO2: 26 mmol/L (ref 22–32)
Calcium: 9.4 mg/dL (ref 8.9–10.3)
Chloride: 100 mmol/L (ref 98–111)
Creatinine, Ser: 0.77 mg/dL (ref 0.44–1.00)
GFR, Estimated: >60 mL/min (ref >60)
Glucose, Bld: 130 mg/dL — ABNORMAL HIGH (ref 70–99)
Potassium: 4.7 mmol/L (ref 3.5–5.1)
Sodium: 134 mmol/L — ABNORMAL LOW (ref 135–145)
Total Bilirubin: 0.9 mg/dL (ref 0.3–1.2)
Total Protein: 6 g/dL — ABNORMAL LOW (ref 6.5–8.1)

## 2021-11-02 LAB — MISC LABCORP TEST (SEND OUT): Labcorp test code: 9985

## 2021-11-02 LAB — PROTIME-INR
INR: 1.5 — ABNORMAL HIGH (ref 0.8–1.2)
Prothrombin Time: 17.7 seconds — ABNORMAL HIGH (ref 11.4–15.2)

## 2021-11-02 MED ORDER — OXYCODONE HCL 5 MG PO TABS
5.0000 mg | ORAL_TABLET | ORAL | Status: DC | PRN
  Administered 2021-11-04 – 2021-11-08 (×12): 5 mg via ORAL
  Filled 2021-11-02 (×12): qty 1

## 2021-11-02 MED ORDER — FUROSEMIDE 40 MG PO TABS
40.0000 mg | ORAL_TABLET | Freq: Every day | ORAL | Status: DC
  Administered 2021-11-02 – 2021-11-08 (×7): 40 mg via ORAL
  Filled 2021-11-02 (×7): qty 1

## 2021-11-02 MED ORDER — SPIRONOLACTONE 25 MG PO TABS
100.0000 mg | ORAL_TABLET | Freq: Every day | ORAL | Status: DC
  Administered 2021-11-03 – 2021-11-08 (×6): 100 mg via ORAL
  Filled 2021-11-02 (×6): qty 4

## 2021-11-02 NOTE — Progress Notes (Addendum)
PCCM Progress Note ? ?S: Patient resting comfortably this morning s/p TIPS, thoracentesis, paracentesis. Now on room air ? ?Blood pressure 116/68, pulse 96, temperature 98.2 ?F (36.8 ?C), temperature source Oral, resp. rate (!) 25, height '5\' 4"'$  (1.626 m), weight 57.5 kg, SpO2 96 %. ?On exam, pleasant female, NAD. Lungs CTAB, no wheezing. Heart RRR, no murmur. Abdomen soft, improved distension, +BS. Extremities no edema, warm to touch ? ?Assessment/Plan ?Recurrent right hepatic hydrothorax ?Endstage-liver disease related to alcoholic cirrhosis ?E.Colic bacteremia ? ?No oxygen requirement. IR following CXR in am ? ?Pulmonary will sign off. ? ?CC Time: 25 min ? ?Rodman Pickle, M.D. ?[facility] ?11/02/2021 1:00 PM  ? ?See [place] for personal pager ?For hours between 7 PM to 7 AM, please call [place] for urgent questions ? ? ?

## 2021-11-02 NOTE — Progress Notes (Signed)
? ? ?Referring Physician(s): Dr. Havery Moros ? ?Supervising Physician: Jacqulynn Cadet ? ?Patient Status:  [facility] - In-pt ? ?Chief Complaint: Cirrhosis with portal hypertension and recurrent ascites. S/p TIPS creation 11/01/21 by Dr. Laurence Ferrari.  ? ?Subjective: Patient resting in bed, sleepy but easily arousable. She is alert and oriented. She complains of moderate abdominal discomfort related to yesterday's procedure.  ? ?Allergies: ?Contrast media [iodinated contrast media], Latex, and Sulfa antibiotics ? ?Medications: ?Prior to Admission medications   ?Medication Sig Start Date End Date Taking? Authorizing Provider  ?furosemide (LASIX) 20 MG tablet Take 2 tablets (40 mg total) by mouth 2 (two) times daily. 09/11/21 12/10/21 Yes Armbruster, Carlota Raspberry, MD  ?Multiple Vitamin (MULTIVITAMIN WITH MINERALS) TABS tablet Take 1 tablet by mouth daily.   Yes [provider]  ?prednisoLONE acetate (PRED FORTE) 1 % ophthalmic suspension Place 1 drop into both eyes 2 (two) times daily.   Yes [provider]  ?spironolactone (ALDACTONE) 100 MG tablet Take 2 tablets (200 mg total) by mouth once daily 09/11/21  Yes Armbruster, Carlota Raspberry, MD  ? ? ? ?Vital Signs: ?BP 111/70 (BP Location: Right Arm)   Pulse 85   Temp 98.3 ?F (36.8 ?C) (Oral)   Resp 20   Ht '5\' 4"'$  (1.626 m)   Wt 126 lb 11.2 oz (57.5 kg)   SpO2 93%   BMI 21.75 kg/m?  ? ?Physical Exam ?Constitutional:   ?   General: She is not in acute distress. ?   Appearance: She is not ill-appearing.  ?Cardiovascular:  ?   Rate and Rhythm: Normal rate and regular rhythm.  ?   Comments: Right IJ and Right femoral vascular access sites - clean, dry, soft and with minimal tenderness. Multiple abdominal puncture sites - all sites clean, dry, soft and with minimal tenderness. All dressings clean, dry and intact.  ?Pulmonary:  ?   Effort: Pulmonary effort is normal.  ?Abdominal:  ?   General: There is no distension.  ?   Palpations: Abdomen is soft.  ?   Tenderness: There  is no abdominal tenderness.  ?Skin: ?   General: Skin is warm and dry.  ?Neurological:  ?   Mental Status: She is alert and oriented to person, place, and time.  ?   Motor: No weakness.  ? ? ?Imaging: ?IR Tips ? ?Result Date: 11/02/2021 ?CLINICAL DATA:  56 year old female with a history of alcoholic cirrhosis complicated by recurrent large volume ascites and hepatic hydrothorax. She presents for paracentesis, thoracentesis and TIPS creation. EXAM: 1. Ultrasound-guided paracentesis 2. Ultrasound-guided right thoracentesis 3. Ultrasound-guided right common femoral venous access for introduction of intravascular ultrasound 4. TIPS creation MEDICATIONS: As antibiotic prophylaxis, 1 g Rocephin was ordered pre-procedure and administered intravenously within one hour of incision. ANESTHESIA/SEDATION: General - as administered by the Anesthesia department CONTRAST:  110 mL Omnipaque 300 FLUOROSCOPY TIME:  Radiation exposure index: 461 mGy reference air kerma COMPLICATIONS: None immediate. PROCEDURE: Informed written consent was obtained from the patient after a thorough discussion of the procedural risks, benefits and alternatives. All questions were addressed. Maximal Sterile Barrier Technique was utilized including caps, mask, sterile gowns, sterile gloves, sterile drape, hand hygiene and skin antiseptic. A timeout was performed prior to the initiation of the procedure. PARACENTESIS The abdomen was interrogated with ultrasound. There is a large amount of free ascites. A suitable skin entry site was selected and marked. The region was sterilely prepped and draped in the standard fashion using chlorhexidine skin prep. Local anesthesia was attained by  infiltration with 1% lidocaine. A small dermatotomy was made. Under ultrasound guidance, a 6 French Safe-T-Centesis catheter was advanced into the fluid collection. Paracentesis was then performed yielding approximately 3.5 L of ascites. The catheter was removed and a bandage  applied. THORACENTESIS The right chest was interrogated with ultrasound. There is a large right-sided pleural effusion. A suitable skin entry site was selected and marked. The region was sterilely prepped and draped in the standard fashion using chlorhexidine skin prep. Local anesthesia was attained by infiltration with 1% lidocaine. A small dermatotomy was made. Under ultrasound guidance, a 6 French Safe-T-Centesis catheter was advanced into the right pleural fluid. Thoracentesis was performed yielding 3 L of straw-colored pleural fluid. The catheter was removed. A bandage was applied. INTRAVASCULAR [place] The right common femoral vein was interrogated with ultrasound and found to be widely patent. An image was obtained and stored for the medical record. Local anesthesia was attained by infiltration with 1% lidocaine. A small dermatotomy was made. Under real-time sonographic guidance, the vessel was punctured with a 21 gauge micropuncture needle. Using standard technique, the initial micro needle was exchanged over a 0.018 micro wire for a transitional 4 [place] micro sheath. The micro sheath was then exchanged over a 0.035 wire for a 9 French vascular sheath. The 8 French intracardiac echocardiography (ICE) catheter was then advanced over a Bentson wire into the inferior cavoatrial junction. Intravascular ultrasound was then performed evaluating the supra hepatic and intra hepatic IVC, hepatic veins and portal veins. The portal veins are diminutive but patent. TIPS The right internal jugular vein was interrogated with ultrasound and found to be widely patent. An image was obtained and stored for the medical record. Local anesthesia was attained by infiltration with 1% lidocaine. A small dermatotomy was made. Under real-time sonographic guidance, the vessel was punctured with a 21 gauge micropuncture needle. Using standard technique, the initial micro needle was exchanged over a 0.018 micro wire for a transitional 4  [place] micro sheath. The micro sheath was then exchanged over a 0.035 wire for a 5 French vascular sheath. Attention was turned to the right abdomen. The abdomen was interrogated with ultrasound. The liver is shrunken and extremely heterogeneous. The gallbladder is mildly distended. There was great difficulty in identifying the diminutive portal veins. Several small portal venous radicles were accessed, however the trajectory would not facilitate passage of a wire into the central portal venous system. Ultimately, using a combination of mid epigastric transabdominal ultrasound guidance, and a right lateral intra axillary needle approach, a 22 gauge needle was successfully advanced along a transhepatic course from lateral to medial and used to select a peripheral branch of the right portal vein. A 0.018 wire was navigated in the central portal veins. The needle was then exchanged and a 5 [place] Accustick sheath advanced over the needle and into the main portal vein. Transhepatic portal venography was then performed. The intrahepatic and main portal veins are markedly diminutive. The 5 French catheter is essentially occlusive in the right portal vein. There is marked hypertrophy of the posterior gastric vein filling a large portosystemic collateral. The Nitinol portion of the 0.018 wire was inserted with the tip at the portal venous confluence to provide an intra portal marker. Attention was turned back to the 5 French sheath in the right internal jugular vein. The sheath was aspirated and flushed. A wire was introduced and passed through the heart into the inferior vena cava. The 5 French sheath was removed. The skin tract was dilated  to 10 [place] and a 9 [place] braided hydrophilic tips sheath advanced over the wire and positioned in the inferior vena cava. A 5 [place] MPA catheter was inserted coaxially through the sheath and the sheath was brought back into the right atrium. The catheter was then manipulated into  the origin of the middle hepatic vein and the middle hepatic vein was selected over a Bentson wire. Once the catheter was in the middle hepatic vein, a hepatic venogram was performed confirming the venous anatomy.

## 2021-11-02 NOTE — Progress Notes (Signed)
? ?PROGRESS NOTE ? ? ? ?Katie Holland  YQM:[id number] DOB: 1966-03-22 DOA: 10/22/2021 ?PCP: Billie Ruddy, MD ? ? ?Brief Narrative: ?The patient is a 56 year old African-American female with past medical history significant for but not limited to alcoholic liver failure, history of alcohol abuse, history of tobacco abuse, history of esophageal varices, history of upper GI bleed as well as other comorbidities who presented with worsening shortness of breath and increased O2 requirement.  She underwent an ultrasound-guided therapeutic paracentesis that yielded about 9.8 L of clear yellow fluid and had tolerated well and was administered 75 g of albumin.  Subsequently she is noted to be short of breath and had an elevated heart rate and high blood pressure even after paracentesis.  She is noted to be saturating 89% and increased work of bleeding and to be placed on 4 L of supplemental oxygen which improved her O2 saturations 96%.  There is attempt to wean her from her 4 L but because she failed to do so she was sent the ED for further evaluation.  She is noted to be somewhat more lethargic than usual and she was on home Lasix and spironolactone.  She also had some abdominal pain and does not drink anymore and her last drink was 3 months ago.  She continues to smoke cigarettes 6 to 7 cigarettes a day and think about quitting.  She had no bleeding and her last thoracentesis was 9 months ago she stated.  While in the ED she is noted to have a lactic acid of 2.3 and a chest x-ray that was worrisome for her right middle right lower lobe large right pleural effusion but also could be pneumonia versus CHF.  GI was notified of patient's admission and blood cultures and sputum cultures were obtained.  She underwent a CT of the chest which showed a right large sided pleural effusion with right middle lobe and right lower lobe consolidation as well as residual ascites in the abdomen following paracentesis.  She had cirrhotic  changes of the liver that were seen.  She is admitted for severe sepsis given that her lactic acid was level greater than 2 and that she had new oxygen requirement of 4 L.  She was placed on antibiotics azithromycin and ceftriaxone.  She is found to have 1 out of 4 bottles of E. coli bacteremia with possible pneumonia and she is continued on ceftriaxone and azithromycin.  IR was also consulted and she underwent a 1.8 L thoracentesis and there was question removed with there this was a hepatic hydrothorax.  She is continue on ceftriaxone and azithromycin and she did have large ascites that is refractory to high-dose diuretics.  GI evaluated and recommended resuming her home diuretic doses of Lasix 40 mg twice daily spironolactone 20 mg daily follow-up for a low sodium diet.  Patient was stressed to get a TIPS however this was canceled in the setting of her bacteremia ? ?GI has initiated her back on her home diuretics but if her renal function is okay there we will try increasing as she tolerates tomorrow.  They are recommending a discharge that she stay on Lasix 40 mg twice daily and spironolactone 200 g daily but they may increase it while she is in house.  Repeat chest x-ray yesterday showed a moderate pleural effusion again and today CXR showed "Large right pleural effusion, increased in size since the study from 1 day prior with worsened aeration of the right lung." ? ?Repeat CXR showed worsening  large right pleural effusion and because of her shortness of breath while ambulation a repeat thoracentesis was ordered.  GI recommending continuing diuretics and may increase the dose and recommending continuing low-sodium diet. ? ?She is s/p Thoracentesis again and 1 Liter was removed on 10/25/21.  GI has increased her diuretics to 40 mg of Lasix 3 times daily and 300 mg spironolactone daily however yesterday when I  walked in the room she was eating a large bag of Lays potato chips so I advised her about her salt  intake. ? ?10/27/21 she was actively vomiting and retching and did not feel as good. She is improved 10/28/21 but back on Supplemental O2 and complains of slight SOB.  GI will reach out to interventional radiology about timing of the TIPS and will continue antibiotics for 7 days for her E. coli bacteremia.  GI recommending continuing current dose of diuretics and not escalating dose yet ? ?10/28/21 Had worsening of he Pleural Effusion and had some dyspnea so Pulmonary was consulted who feels that she has a hepatic hydrothorax and recommending definitive management with TIPS but recommending another thoracentesis for comfort if she becomes more dyspenic.  GI spoken with IR and they will write a note outlining the plans regarding future TIPS.  Her antibiotics were switched from ceftriaxone to Ancef for 2 more day to complete 7 days of gram-negative coverage. ? ? ?Assessment and Plan: ? ?Severe sepsis ?Secondary to E. Coli bacteremia and pneumonia. Resolved with antibiotics. ? ?E. Coli bacteremia ?Patient treated with Ceftriaxone IV and transitioned to Cefazolin. Completed a 7 day course of antibiotics. Likely translocation from GI source. Resolved. ? ?Community acquired pneumonia ?Patient treated with Ceftriaxone and azithromycin. Completed course. ? ?Acute respiratory failure with hypoxia ?Secondary to pneumonia and pleural effusion from hepatic hydrothorax. Patient is s/p multiple thoracenteses. Weaned to room air. Resolved. ? ?Decompensated alcoholic cirrhosis ?Ascites ?Esophageal varices ?GI on board. Patient is currently on high dose diuretic therapy. GI recommending TIPS. IR consulted for TIPS which was successfully completed on 5/12. ?-Continue Lasix and Spironolactone ?-Continue lactulose ?-IR follow-up as an outpatient ? ?Right apical pneumothorax ?Small. After thoracentesis. Patient is on room air. IR managing with repeat chest x-ray. ? ?Hyperammoniemia ?No note of hepatic encephalopathy, but patient developed  asterixis. Lactulose therapy initiated. ? ?Elevated troponin ?Troponin peak of 234. Assessment is demand ischemia. ? ?Non thyroidal illness ?Likely diagnosis in critically ill patient. Low TSH and T3 with normal free T4. Recommend repeat thyroid testing as an outpatient when no longer acutely ill. ? ?Microcytic anemia ?B12 Deficiency ?Chronic and stable hemoglobin. ?-Continue B12 supplementation ? ?Vitamin D deficiency ?Vitamin B6 deficiency ?Zinc deficiency ?-Continue vitamin D3, vitamin B6 and zine supplementation ? ?Nausea/vomiting ?Resolved. ? ?Thrombocytopenia ?Mild. In setting of liver disease. ? ?Lactic acidosis ?In setting of liver failure and sepsis.  ? ?Severe malnutrition ?In setting of chronic illness. Dietitian consulted.  ?Dietitian recommendations (5/10): ?Continue Multivitamin w/ minerals daily ?Continue Boost Plus po BID, provides 360 kcal and 14 grams of protein each. ?Encourage good PO intake  ? ?DVT prophylaxis: SCDs ?Code Status:   Code Status: DNR ?Family Communication: None at bedside ?Disposition Plan: Discharge home with home health likely in 1-2 days pending IR recommendations for pneumothorax. Home with home health. ? ? ?Consultants:  ?PCCM ?[facility] ?Interventional radiology ? ?Procedures:  ?Paracentesis (10/22/2021) ?Right thoracentesis (10/23/2021) ?Right thoracentesis (10/25/2021) ? ?Antimicrobials: ?Ceftriaxone IV ?Azithromycin IV ?Cefazolin IV  ? ? ?Subjective: ?Abdominal pain is better. No dyspnea or chest pain.  Having bowel movements. ? ?Objective: ?BP 116/68 (BP Location: Right Arm)   Pulse 96   Temp 98.2 ?F (36.8 ?C) (Oral)   Resp (!) 25   Ht '5\' 4"'$  (1.626 m)   Wt 57.5 kg   SpO2 96%   BMI 21.75 kg/m?  ? ?Examination: ? ?General exam: Appears calm and comfortable ?Respiratory system: Clear to auscultation. Respiratory effort normal. ?Cardiovascular system: S1 & S2 heard, RRR. No murmurs. ?Gastrointestinal system: Abdomen is nondistended, soft and nontender. Normal  bowel sounds heard. ?Central nervous system: Alert and oriented. No focal neurological deficits. ?Musculoskeletal: No calf tenderness ?Skin: No cyanosis. No rashes ?Psychiatry: Judgement and insight appear normal. Mood & a

## 2021-11-02 NOTE — Progress Notes (Signed)
[facility] Progress Note ? ?Chief Complaint: Alcohol related cirrhosis with portal hypertension with ascites and hepatic hydrothorax ? ?History: ? ?She is feeling pretty well today.  Harold Barban underwent a 3.5 L paracentesis, 3 L thoracentesis and TIPS placement without complication.  Portosystemic gradient dropped from 20 to 5 with the procedure.  (IR reports reviewed) ?Her abdominal distention and dyspnea have improved as a result.  She was just seen by the pulmonary consultant who is planning a chest x-ray tomorrow. ? ?She denies chest pain ?Having 2-3 bowel movements a day on current dose of lactulose ?Denies dysuria ? ?Objective: ? ? ?Current Facility-Administered Medications:  ?  0.9 %  sodium chloride infusion, 250 mL, Intravenous, PRN, Roel Cluck, Anastassia, MD ?  cholecalciferol (VITAMIN D3) tablet 1,000 Units, 1,000 Units, Oral, Daily, Raiford Noble [place], DO, 1,000 Units at 11/02/21 1042 ?  diphenhydrAMINE (BENADRYL) capsule 50 mg, 50 mg, Oral, Once, Zigmund Daniel, Herma Carson, PA ?  fentaNYL (SUBLIMAZE) injection 12.5-50 mcg, 12.5-50 mcg, Intravenous, Q2H PRN, Doutova, Anastassia, MD, 50 mcg at 10/27/21 1042 ?  furosemide (LASIX) tablet 40 mg, 40 mg, Oral, TID, Armbruster, Carlota Raspberry, MD, 40 mg at 11/02/21 1041 ?  guaiFENesin (MUCINEX) 12 hr tablet 1,200 mg, 1,200 mg, Oral, BID, Raiford Noble Latif, DO, 1,200 mg at 11/02/21 1041 ?  ipratropium (ATROVENT) nebulizer solution 0.5 mg, 0.5 mg, Nebulization, Q6H PRN, Alfredia Ferguson, Omair Latif, DO ?  lactose free nutrition (BOOST PLUS) liquid 237 mL, 237 mL, Oral, BID BM, Sheikh, Omair Latif, DO, 237 mL at 11/02/21 1044 ?  lactulose (CHRONULAC) 10 GM/15ML solution 20 g, 20 g, Oral, BID, Raiford Noble Latif, DO, 20 g at 11/02/21 1041 ?  levalbuterol (XOPENEX) nebulizer solution 0.63 mg, 0.63 mg, Nebulization, Q6H PRN, Sheikh, Omair Latif, DO ?  melatonin tablet 3 mg, 3 mg, Oral, QHS PRN, Shela Leff, MD, 3 mg at 11/01/21 2216 ?  multivitamin with minerals  tablet 1 tablet, 1 tablet, Oral, Daily, Raiford Noble [place], DO, 1 tablet at 11/02/21 1041 ?  nicotine (NICODERM CQ - dosed in mg/24 hr) patch 7 mg, 7 mg, Transdermal, Daily, Doutova, Anastassia, MD, 7 mg at 11/02/21 1042 ?  ondansetron (ZOFRAN) injection 4 mg, 4 mg, Intravenous, Q6H PRN, Raiford Noble Latif, DO, 4 mg at 11/01/21 1847 ?  oxyCODONE (Oxy IR/ROXICODONE) immediate release tablet 5 mg, 5 mg, Oral, Q4H PRN, [place], Jamie R, NP ?  predniSONE (DELTASONE) tablet 50 mg, 50 mg, Oral, Once, Zigmund Daniel, Herma Carson, PA ?  prochlorperazine (COMPAZINE) injection 10 mg, 10 mg, Intravenous, Q6H PRN, Sheikh, Omair Latif, DO, 10 mg at 11/02/21 0100 ?  sodium chloride flush (NS) 0.9 % injection 3 mL, 3 mL, Intravenous, Q12H, Doutova, Anastassia, MD, 3 mL at 11/02/21 1042 ?  sodium chloride flush (NS) 0.9 % injection 3 mL, 3 mL, Intravenous, PRN, Doutova, Anastassia, MD ?  spironolactone (ALDACTONE) tablet 300 mg, 300 mg, Oral, Daily, Armbruster, Carlota Raspberry, MD, 300 mg at 11/02/21 1043 ?  traMADol (ULTRAM) tablet 50 mg, 50 mg, Oral, Q6H PRN, Doutova, Anastassia, MD, 50 mg at 11/01/21 2215 ?  vitamin B-12 (CYANOCOBALAMIN) tablet 1,000 mcg, 1,000 mcg, Oral, Daily, Raiford Noble Latif, DO, 1,000 mcg at 11/02/21 1043 ?  vitamin B-6 (pyridOXINE) tablet 50 mg, 50 mg, Oral, Daily, Raiford Noble Latif, DO, 50 mg at 11/02/21 1041 ?  zinc sulfate capsule 220 mg, 220 mg, Oral, Daily, Raiford Noble [place], DO, 220 mg at 11/02/21 1041 ? ?Facility-Administered Medications Ordered in Other Encounters:  ?  lactated ringers  infusion, , , Continuous PRN, Leonor Liv, CRNA, New Bag at 03/09/19 0800 ?  lactated ringers infusion, , , Continuous PRN, Leonor Liv, CRNA, New Bag at 03/09/19 9628 ? ? sodium chloride    ?  ? ?Vital signs in last 24 hrs: ?Vitals:  ? 11/02/21 0320 11/02/21 0729  ?BP: 102/73 111/70  ?Pulse: 92 85  ?Resp: 20 20  ?Temp: 98.8 ?F (37.1 ?C) 98.3 ?F (36.8 ?C)  ?SpO2: 93% 93%  ? ? ?Intake/Output Summary (Last 24  hours) at 11/02/2021 1125 ?Last data filed at 11/02/2021 0321 ?Gross per 24 hour  ?Intake 250 ml  ?Output 710 ml  ?Net -460 ml  ? ? ? ?Physical Exam ?Chronically ill-appearing woman with poor muscle mass.  However she is in better spirits than I last saw her. ?HEENT: sclera anicteric, oral mucosa without lesions.  Temporal wasting ?Neck: supple, no thyromegaly, JVD or lymphadenopathy ?Cardiac: RRR without murmurs, S1S2 heard, no peripheral edema ?Pulm: Breathing comfortably on room air, mildly decreased breath sounds right base, normal RR and effort noted ?Abdomen: soft, much less ascites than I last saw her, no tenderness, with active bowel sounds.  ?Skin; warm and dry, no jaundice ? ?Recent Labs: ? ? ?  Latest Ref Rng & Units 11/02/2021  ?  1:52 AM 11/01/2021  ?  6:14 PM 11/01/2021  ?  5:38 AM  ?CBC  ?WBC 4.0 - 10.5 K/uL 11.4   11.5   4.3    ?Hemoglobin 12.0 - 15.0 g/dL 10.0   9.7   9.2    ?Hematocrit 36.0 - 46.0 % 30.8   31.7   28.4    ?Platelets 150 - 400 K/uL 141   144   177    ? ? ?Recent Labs  ?Lab 11/02/21 ?3662  ?INR 1.5*  ? ? ?  Latest Ref Rng & Units 11/02/2021  ?  1:52 AM 11/01/2021  ?  6:14 PM 11/01/2021  ?  5:38 AM  ?CMP  ?Glucose 70 - 99 mg/dL 130    142    ?BUN 6 - 20 mg/dL 23    19    ?Creatinine 0.44 - 1.00 mg/dL 0.77    0.84    ?Sodium 135 - 145 mmol/L 134    129    ?Potassium 3.5 - 5.1 mmol/L 4.7   4.6   5.5    ?Chloride 98 - 111 mmol/L 100    98    ?CO2 22 - 32 mmol/L 26    26    ?Calcium 8.9 - 10.3 mg/dL 9.4    9.2    ?Total Protein 6.5 - 8.1 g/dL 6.0    6.0    ?Total Bilirubin 0.3 - 1.2 mg/dL 0.9    1.2    ?Alkaline Phos 38 - 126 U/L 87    88    ?AST 15 - 41 U/L 59    37    ?ALT 0 - 44 U/L 34    22    ? ? ? ?Radiologic studies: ? ?IR reports reviewed and on file ? ?Assessment & Plan  ?Assessment: ? ?Alcohol-related cirrhosis with refractory ascites and hepatic hydrothorax from portal hypertension ? ?Status post paracentesis, thoracentesis and successful TIPS placement yesterday. ? ?BUN mildly elevated  today, creatinine stable.  Her blood pressures are still running relatively low, so I am decreasing the spironolactone and furosemide doses so as not to risk renal dysfunction. ?As needed doses of IV diuretics can be given as needed in  hospital if she should develop significant worsening of ascites or pleural effusion.  Even after successful TIPS placement, the full effect regarding the vascular hemodynamics and improvement in volume overload may take weeks. ? ? ?Inpatient GI service will sign off, you may certainly call us as needed. ?I will message her primary GI doctor (Armbruster) so he can see when she is discharged and plan follow-up with him. ? ?Continue lactulose 20 g twice daily, titrate to achieve 2-3 soft bowel movements per day.  Monitor for development of about encephalopathy after TIPS placement ? ? ?35 minutes were spent on this encounter (including chart review, history/exam, counseling/coordination of care, and documentation) > 50% of that time was spent on counseling and coordination of care. ? ? ?Nelida Meuse III ?Office: [phone] ? ?

## 2021-11-03 ENCOUNTER — Inpatient Hospital Stay (HOSPITAL_COMMUNITY): Payer: Medicare HMO

## 2021-11-03 DIAGNOSIS — J189 Pneumonia, unspecified organism: Secondary | ICD-10-CM | POA: Diagnosis not present

## 2021-11-03 DIAGNOSIS — J9601 Acute respiratory failure with hypoxia: Secondary | ICD-10-CM | POA: Diagnosis not present

## 2021-11-03 DIAGNOSIS — K7031 Alcoholic cirrhosis of liver with ascites: Secondary | ICD-10-CM | POA: Diagnosis not present

## 2021-11-03 DIAGNOSIS — A419 Sepsis, unspecified organism: Secondary | ICD-10-CM | POA: Diagnosis not present

## 2021-11-03 LAB — BASIC METABOLIC PANEL
Anion gap: 6 (ref 5–15)
BUN: 24 mg/dL — ABNORMAL HIGH (ref 6–20)
CO2: 27 mmol/L (ref 22–32)
Calcium: 8.9 mg/dL (ref 8.9–10.3)
Chloride: 100 mmol/L (ref 98–111)
Creatinine, Ser: 0.81 mg/dL (ref 0.44–1.00)
GFR, Estimated: >60 mL/min (ref >60)
Glucose, Bld: 122 mg/dL — ABNORMAL HIGH (ref 70–99)
Potassium: 4.8 mmol/L (ref 3.5–5.1)
Sodium: 133 mmol/L — ABNORMAL LOW (ref 135–145)

## 2021-11-03 LAB — CULTURE, BLOOD (ROUTINE X 2)
Culture: NO GROWTH
Culture: NO GROWTH
Special Requests: ADEQUATE
Special Requests: ADEQUATE

## 2021-11-03 MED ORDER — ALUM & MAG HYDROXIDE-SIMETH 200-200-20 MG/5ML PO SUSP
30.0000 mL | Freq: Once | ORAL | Status: AC | PRN
  Administered 2021-11-03: 30 mL via ORAL
  Filled 2021-11-03: qty 30

## 2021-11-03 NOTE — Progress Notes (Signed)
? ?PROGRESS NOTE ? ? ? ?Katie Holland  ZDG:[id number] DOB: [birth year] DOA: 10/22/2021 ?PCP: Billie Ruddy, MD ? ? ?Brief Narrative: ?The patient is a 56 year old African-American female with past medical history significant for but not limited to alcoholic liver failure, history of alcohol abuse, history of tobacco abuse, history of esophageal varices, history of upper GI bleed as well as other comorbidities who presented with worsening shortness of breath and increased O2 requirement.  She underwent an ultrasound-guided therapeutic paracentesis that yielded about 9.8 L of clear yellow fluid and had tolerated well and was administered 75 g of albumin.  Subsequently she is noted to be short of breath and had an elevated heart rate and high blood pressure even after paracentesis.  She is noted to be saturating 89% and increased work of bleeding and to be placed on 4 L of supplemental oxygen which improved her O2 saturations 96%.  There is attempt to wean her from her 4 L but because she failed to do so she was sent the ED for further evaluation.  She is noted to be somewhat more lethargic than usual and she was on home Lasix and spironolactone.  She also had some abdominal pain and does not drink anymore and her last drink was 3 months ago.  She continues to smoke cigarettes 6 to 7 cigarettes a day and think about quitting.  She had no bleeding and her last thoracentesis was 9 months ago she stated.  While in the ED she is noted to have a lactic acid of 2.3 and a chest x-ray that was worrisome for her right middle right lower lobe large right pleural effusion but also could be pneumonia versus CHF.  GI was notified of patient's admission and blood cultures and sputum cultures were obtained.  She underwent a CT of the chest which showed a right large sided pleural effusion with right middle lobe and right lower lobe consolidation as well as residual ascites in the abdomen following paracentesis.  She had cirrhotic  changes of the liver that were seen.  She is admitted for severe sepsis given that her lactic acid was level greater than 2 and that she had new oxygen requirement of 4 L.  She was placed on antibiotics azithromycin and ceftriaxone.  She is found to have 1 out of 4 bottles of E. coli bacteremia with possible pneumonia and she is continued on ceftriaxone and azithromycin.  IR was also consulted and she underwent a 1.8 L thoracentesis and there was question removed with there this was a hepatic hydrothorax.  She is continue on ceftriaxone and azithromycin and she did have large ascites that is refractory to high-dose diuretics.  GI evaluated and recommended resuming her home diuretic doses of Lasix 40 mg twice daily spironolactone 20 mg daily follow-up for a low sodium diet.  Patient was stressed to get a TIPS however this was canceled in the setting of her bacteremia ? ?GI has initiated her back on her home diuretics but if her renal function is okay there we will try increasing as she tolerates tomorrow.  They are recommending a discharge that she stay on Lasix 40 mg twice daily and spironolactone 200 g daily but they may increase it while she is in house.  Repeat chest x-ray yesterday showed a moderate pleural effusion again and today CXR showed "Large right pleural effusion, increased in size since the study from 1 day prior with worsened aeration of the right lung." ? ?Repeat CXR showed worsening  large right pleural effusion and because of her shortness of breath while ambulation a repeat thoracentesis was ordered.  GI recommending continuing diuretics and may increase the dose and recommending continuing low-sodium diet. ? ?She is s/p Thoracentesis again and 1 Liter was removed on 10/25/21.  GI has increased her diuretics to 40 mg of Lasix 3 times daily and 300 mg spironolactone daily however yesterday when I  walked in the room she was eating a large bag of Lays potato chips so I advised her about her salt  intake. ? ?10/27/21 she was actively vomiting and retching and did not feel as good. She is improved 10/28/21 but back on Supplemental O2 and complains of slight SOB.  GI will reach out to interventional radiology about timing of the TIPS and will continue antibiotics for 7 days for her E. coli bacteremia.  GI recommending continuing current dose of diuretics and not escalating dose yet ? ?10/28/21 Had worsening of he Pleural Effusion and had some dyspnea so Pulmonary was consulted who feels that she has a hepatic hydrothorax and recommending definitive management with TIPS but recommending another thoracentesis for comfort if she becomes more dyspenic.  GI spoken with IR and they will write a note outlining the plans regarding future TIPS.  Her antibiotics were switched from ceftriaxone to Ancef for 2 more day to complete 7 days of gram-negative coverage. ? ? ?Assessment and Plan: ? ?Severe sepsis ?Secondary to E. Coli bacteremia and pneumonia. Resolved with antibiotics. ? ?E. Coli bacteremia ?Patient treated with Ceftriaxone IV and transitioned to Cefazolin. Completed a 7 day course of antibiotics. Likely translocation from GI source. Resolved. ? ?Community acquired pneumonia ?Patient treated with Ceftriaxone and azithromycin. Completed course. ? ?Acute respiratory failure with hypoxia ?Secondary to pneumonia and pleural effusion from hepatic hydrothorax. Patient is s/p multiple thoracenteses. Weaned to room air. Resolved. ? ?Decompensated alcoholic cirrhosis ?Ascites ?Esophageal varices ?GI on board. Patient is currently on high dose diuretic therapy. GI recommending TIPS. IR consulted for TIPS which was successfully completed on 5/12. ?-Continue Lasix and Spironolactone ?-Continue lactulose ?-IR follow-up as an outpatient ? ?Right apical pneumothorax ?Post-thoracentesis. Patient is on room air. Repeat chest x-ray shows expanding pneumothorax. Patient still remains asymptomatic. ?-Per IR, repeat chest x-ray in  AM ? ?Hyperammoniemia ?No note of hepatic encephalopathy, but patient developed asterixis. Lactulose therapy initiated. ? ?Elevated troponin ?Troponin peak of 234. Assessment is demand ischemia. ? ?Non thyroidal illness ?Likely diagnosis in critically ill patient. Low TSH and T3 with normal free T4. Recommend repeat thyroid testing as an outpatient when no longer acutely ill. ? ?Microcytic anemia ?B12 Deficiency ?Chronic and stable hemoglobin. ?-Continue B12 supplementation ? ?Vitamin D deficiency ?Vitamin B6 deficiency ?Zinc deficiency ?-Continue vitamin D3, vitamin B6 and zine supplementation ? ?Nausea/vomiting ?Resolved. ? ?Thrombocytopenia ?Mild. In setting of liver disease. ? ?Lactic acidosis ?In setting of liver failure and sepsis.  ? ?Severe malnutrition ?In setting of chronic illness. Dietitian consulted.  ?Dietitian recommendations (5/10): ?Continue Multivitamin w/ minerals daily ?Continue Boost Plus po BID, provides 360 kcal and 14 grams of protein each. ?Encourage good PO intake  ? ?DVT prophylaxis: SCDs ?Code Status:   Code Status: DNR ?Family Communication: None at bedside ?Disposition Plan: Discharge home with home health likely in 1-2 days pending IR recommendations for pneumothorax. Home with home health. ? ? ?Consultants:  ?PCCM ?[facility] ?Interventional radiology ? ?Procedures:  ?Paracentesis (10/22/2021) ?Right thoracentesis (10/23/2021) ?Right thoracentesis (10/25/2021) ? ?Antimicrobials: ?Ceftriaxone IV ?Azithromycin IV ?Cefazolin IV  ? ? ?Subjective: ?  No concerns this morning. Feels good. Abdomen is flat. Breathing well. No chest pain or cough. ? ?Objective: ?BP 110/72 (BP Location: Right Arm)   Pulse 87   Temp 98.5 ?F (36.9 ?C) (Oral)   Resp 16   Ht '5\' 4"'$  (1.626 m)   Wt 57.5 kg   SpO2 98%   BMI 21.75 kg/m?  ? ?Examination: ? ?General exam: Appears calm and comfortable ?Respiratory system: Clear to auscultation. Respiratory effort normal. ?Cardiovascular system: S1 & S2 heard,  RRR. ?Gastrointestinal system: Abdomen is non-distended, soft and nontender. Normal bowel sounds heard. ?Central nervous system: Alert and oriented. No focal neurological deficits. ?Musculoskeletal: No edema. No calf tenderness

## 2021-11-03 NOTE — Progress Notes (Signed)
? ? ?Referring Physician(s): Dr. Havery Moros ? ?Supervising Physician: Jacqulynn Cadet ? ?Patient Status:  [facility] - In-pt ? ?Chief Complaint: Cirrhosis with portal hypertension and recurrent ascites. S/p TIPS creation 11/01/21 by Dr. Laurence Ferrari. ? ?Subjective: Patient resting in bed awake and alert. She is in good spirits and states she feels much better today. Her pain/discomfort has improved from yesterday.  ? ?Allergies: ?Contrast media [iodinated contrast media], Latex, and Sulfa antibiotics ? ?Medications: ?Prior to Admission medications   ?Medication Sig Start Date End Date Taking? Authorizing Provider  ?furosemide (LASIX) 20 MG tablet Take 2 tablets (40 mg total) by mouth 2 (two) times daily. 09/11/21 12/10/21 Yes Armbruster, Carlota Raspberry, MD  ?Multiple Vitamin (MULTIVITAMIN WITH MINERALS) TABS tablet Take 1 tablet by mouth daily.   Yes [provider]  ?prednisoLONE acetate (PRED FORTE) 1 % ophthalmic suspension Place 1 drop into both eyes 2 (two) times daily.   Yes [provider]  ?spironolactone (ALDACTONE) 100 MG tablet Take 2 tablets (200 mg total) by mouth once daily 09/11/21  Yes Armbruster, Carlota Raspberry, MD  ? ? ? ?Vital Signs: ?BP 119/75 (BP Location: Right Arm)   Pulse 93   Temp 98.8 ?F (37.1 ?C) (Oral)   Resp 19   Ht '5\' 4"'$  (1.626 m)   Wt 126 lb 11.2 oz (57.5 kg)   SpO2 96%   BMI 21.75 kg/m?  ? ?Physical Exam ?Constitutional:   ?   General: She is not in acute distress. ?   Appearance: She is not ill-appearing.  ?Cardiovascular:  ?   Comments: Right IJ and right femoral vascular sites are clean, soft and dry. Dressings removed today. No drainage, erythema or tenderness.  ?Pulmonary:  ?   Effort: Pulmonary effort is normal.  ?Abdominal:  ?   Palpations: Abdomen is soft.  ?   Tenderness: There is no abdominal tenderness.  ?   Comments: Abdominal punctures sites x 3. Dressings removed from each site. All sites are clean, soft, dry and non-tender. No drainage or erythema.   ?Skin: ?    General: Skin is warm and dry.  ?Neurological:  ?   Mental Status: She is alert and oriented to person, place, and time.  ? ? ?Imaging: ?IR Tips ? ?Result Date: 11/02/2021 ?CLINICAL DATA:  56 year old female with a history of alcoholic cirrhosis complicated by recurrent large volume ascites and hepatic hydrothorax. She presents for paracentesis, thoracentesis and TIPS creation. EXAM: 1. Ultrasound-guided paracentesis 2. Ultrasound-guided right thoracentesis 3. Ultrasound-guided right common femoral venous access for introduction of intravascular ultrasound 4. TIPS creation MEDICATIONS: As antibiotic prophylaxis, 1 g Rocephin was ordered pre-procedure and administered intravenously within one hour of incision. ANESTHESIA/SEDATION: General - as administered by the Anesthesia department CONTRAST:  110 mL Omnipaque 300 FLUOROSCOPY TIME:  Radiation exposure index: 461 mGy reference air kerma COMPLICATIONS: None immediate. PROCEDURE: Informed written consent was obtained from the patient after a thorough discussion of the procedural risks, benefits and alternatives. All questions were addressed. Maximal Sterile Barrier Technique was utilized including caps, mask, sterile gowns, sterile gloves, sterile drape, hand hygiene and skin antiseptic. A timeout was performed prior to the initiation of the procedure. PARACENTESIS The abdomen was interrogated with ultrasound. There is a large amount of free ascites. A suitable skin entry site was selected and marked. The region was sterilely prepped and draped in the standard fashion using chlorhexidine skin prep. Local anesthesia was attained by infiltration with 1% lidocaine. A small dermatotomy was made. Under ultrasound guidance, a 6 [place]  Safe-T-Centesis catheter was advanced into the fluid collection. Paracentesis was then performed yielding approximately 3.5 L of ascites. The catheter was removed and a bandage applied. THORACENTESIS The right chest was interrogated with  ultrasound. There is a large right-sided pleural effusion. A suitable skin entry site was selected and marked. The region was sterilely prepped and draped in the standard fashion using chlorhexidine skin prep. Local anesthesia was attained by infiltration with 1% lidocaine. A small dermatotomy was made. Under ultrasound guidance, a 6 French Safe-T-Centesis catheter was advanced into the right pleural fluid. Thoracentesis was performed yielding 3 L of straw-colored pleural fluid. The catheter was removed. A bandage was applied. INTRAVASCULAR [place] The right common femoral vein was interrogated with ultrasound and found to be widely patent. An image was obtained and stored for the medical record. Local anesthesia was attained by infiltration with 1% lidocaine. A small dermatotomy was made. Under real-time sonographic guidance, the vessel was punctured with a 21 gauge micropuncture needle. Using standard technique, the initial micro needle was exchanged over a 0.018 micro wire for a transitional 4 [place] micro sheath. The micro sheath was then exchanged over a 0.035 wire for a 9 French vascular sheath. The 8 French intracardiac echocardiography (ICE) catheter was then advanced over a Bentson wire into the inferior cavoatrial junction. Intravascular ultrasound was then performed evaluating the supra hepatic and intra hepatic IVC, hepatic veins and portal veins. The portal veins are diminutive but patent. TIPS The right internal jugular vein was interrogated with ultrasound and found to be widely patent. An image was obtained and stored for the medical record. Local anesthesia was attained by infiltration with 1% lidocaine. A small dermatotomy was made. Under real-time sonographic guidance, the vessel was punctured with a 21 gauge micropuncture needle. Using standard technique, the initial micro needle was exchanged over a 0.018 micro wire for a transitional 4 [place] micro sheath. The micro sheath was then exchanged over a  0.035 wire for a 5 French vascular sheath. Attention was turned to the right abdomen. The abdomen was interrogated with ultrasound. The liver is shrunken and extremely heterogeneous. The gallbladder is mildly distended. There was great difficulty in identifying the diminutive portal veins. Several small portal venous radicles were accessed, however the trajectory would not facilitate passage of a wire into the central portal venous system. Ultimately, using a combination of mid epigastric transabdominal ultrasound guidance, and a right lateral intra axillary needle approach, a 22 gauge needle was successfully advanced along a transhepatic course from lateral to medial and used to select a peripheral branch of the right portal vein. A 0.018 wire was navigated in the central portal veins. The needle was then exchanged and a 5 [place] Accustick sheath advanced over the needle and into the main portal vein. Transhepatic portal venography was then performed. The intrahepatic and main portal veins are markedly diminutive. The 5 French catheter is essentially occlusive in the right portal vein. There is marked hypertrophy of the posterior gastric vein filling a large portosystemic collateral. The Nitinol portion of the 0.018 wire was inserted with the tip at the portal venous confluence to provide an intra portal marker. Attention was turned back to the 5 French sheath in the right internal jugular vein. The sheath was aspirated and flushed. A wire was introduced and passed through the heart into the inferior vena cava. The 5 French sheath was removed. The skin tract was dilated to 10 [place] and a 9 [place] braided hydrophilic tips sheath advanced over the wire  and positioned in the inferior vena cava. A 5 [place] MPA catheter was inserted coaxially through the sheath and the sheath was brought back into the right atrium. The catheter was then manipulated into the origin of the middle hepatic vein and the middle hepatic vein  was selected over a Bentson wire. Once the catheter was in the middle hepatic vein, a hepatic venogram was performed confirming the venous anatomy. It is known that this represents the middle hepatic vein based on eval

## 2021-11-04 ENCOUNTER — Inpatient Hospital Stay (HOSPITAL_COMMUNITY): Payer: Medicare HMO

## 2021-11-04 ENCOUNTER — Telehealth: Payer: Self-pay

## 2021-11-04 DIAGNOSIS — J9601 Acute respiratory failure with hypoxia: Secondary | ICD-10-CM | POA: Diagnosis not present

## 2021-11-04 DIAGNOSIS — A419 Sepsis, unspecified organism: Secondary | ICD-10-CM | POA: Diagnosis not present

## 2021-11-04 DIAGNOSIS — J939 Pneumothorax, unspecified: Secondary | ICD-10-CM

## 2021-11-04 DIAGNOSIS — K7031 Alcoholic cirrhosis of liver with ascites: Secondary | ICD-10-CM | POA: Diagnosis not present

## 2021-11-04 DIAGNOSIS — I8500 Esophageal varices without bleeding: Secondary | ICD-10-CM

## 2021-11-04 DIAGNOSIS — J189 Pneumonia, unspecified organism: Secondary | ICD-10-CM | POA: Diagnosis not present

## 2021-11-04 MED ORDER — LIDOCAINE HCL 1 % IJ SOLN
INTRAMUSCULAR | Status: AC
Start: 2021-11-04 — End: 2021-11-05
  Filled 2021-11-04: qty 10

## 2021-11-04 MED ORDER — FENTANYL CITRATE (PF) 100 MCG/2ML IJ SOLN
INTRAMUSCULAR | Status: AC
  Filled 2021-11-04: qty 2

## 2021-11-04 MED ORDER — FENTANYL CITRATE (PF) 100 MCG/2ML IJ SOLN
INTRAMUSCULAR | Status: AC | PRN
  Administered 2021-11-04 (×2): 25 ug via INTRAVENOUS

## 2021-11-04 MED ORDER — MIDAZOLAM HCL 2 MG/2ML IJ SOLN
INTRAMUSCULAR | Status: AC | PRN
Start: 2021-11-04 — End: 2021-11-04
  Administered 2021-11-04: 1 mg via INTRAVENOUS
  Administered 2021-11-04: .5 mg via INTRAVENOUS

## 2021-11-04 MED ORDER — MIDAZOLAM HCL 2 MG/2ML IJ SOLN
INTRAMUSCULAR | Status: AC
  Filled 2021-11-04: qty 2

## 2021-11-04 NOTE — Anesthesia Postprocedure Evaluation (Signed)
Anesthesia Post Note ? ?Patient: Katie Holland ? ?Procedure(s) Performed: TIPS ? ?  ? ?Patient location during evaluation: PACU ?Anesthesia Type: General ?Level of consciousness: awake and alert ?Pain management: pain level controlled ?Vital Signs Assessment: post-procedure vital signs reviewed and stable ?Respiratory status: spontaneous breathing, nonlabored ventilation, respiratory function stable and patient connected to nasal cannula oxygen ?Cardiovascular status: blood pressure returned to baseline and stable ?Postop Assessment: no apparent nausea or vomiting ?Anesthetic complications: no ? ? ?No notable events documented. ? ?Last Vitals:  ?Vitals:  ? 11/04/21 1325 11/04/21 1340  ?BP: 98/61 91/65  ?Pulse: 87 87  ?Resp: 18 19  ?Temp:    ?SpO2: 100% 97%  ?  ?Last Pain:  ?Vitals:  ? 11/04/21 1803  ?TempSrc:   ?PainSc: Asleep  ? ? ?  ?  ?  ?  ?  ?  ? ?[place] ? ? ? ? ?

## 2021-11-04 NOTE — Progress Notes (Signed)
Occupational Therapy Treatment ?Patient Details ?Name: Katie Holland ?MRN: [id number] ?DOB: May 04, 1966 ?Today's Date: 11/04/2021 ? ? ?History of present illness 56 y/o female presented to ED on 10/22/21 for SOB and chest tightness after she received paracentesis earlier that day. Admitted for sepsis 2/2 E. coli bacteremia and CAP. Underwent TIPS 11/01/21.PMH: ETOH cirrhosis ?  ?OT comments ? Pt supervised for walking to bathroom for toileting and sink for grooming. Session limited by transport arriving to take pt to IR.  ? ?Recommendations for follow up therapy are one component of a multi-disciplinary discharge planning process, led by the attending physician.  Recommendations may be updated based on patient status, additional functional criteria and insurance authorization. ?   ?Follow Up Recommendations ? Home health OT  ?  ?Assistance Recommended at Discharge Set up Supervision/Assistance  ?Patient can return home with the following ? A little help with walking and/or transfers;A little help with bathing/dressing/bathroom ?  ?Equipment Recommendations ? Tub/shower bench  ?  ?Recommendations for Other Services   ? ?  ?Precautions / Restrictions Precautions ?Precautions: Fall ?Restrictions ?Weight Bearing Restrictions: No  ? ? ?  ? ?Mobility Bed Mobility ?Overal bed mobility: Modified Independent ?  ?  ?  ?  ?  ?  ?  ?  ? ?Transfers ?Overall transfer level: Needs assistance ?  ?Transfers: Sit to/from Stand ?Sit to Stand: Min guard ?  ?  ?  ?  ?  ?  ?  ?  ?Balance Overall balance assessment: Needs assistance ?  ?Sitting balance-Leahy Scale: Good ?  ?  ?  ?Standing balance-Leahy Scale: Fair ?Standing balance comment: at sink ?  ?  ?  ?  ?  ?  ?  ?  ?  ?  ?  ?   ? ?ADL either performed or assessed with clinical judgement  ? ?ADL Overall ADL's : Needs assistance/impaired ?  ?  ?Grooming: Wash/dry hands;Standing;Min guard ?  ?  ?  ?  ?  ?  ?  ?  ?  ?Toilet Transfer: Supervision/safety ?  ?Toileting- Clothing Manipulation and  Hygiene: Supervision/safety;Sitting/lateral lean ?  ?  ?  ?Functional mobility during ADLs: Supervision/safety ?  ?  ? ?Extremity/Trunk Assessment   ?  ?  ?  ?  ?  ? ?Vision   ?  ?  ?Perception   ?  ?Praxis   ?  ? ?Cognition Arousal/Alertness: Awake/alert ?Behavior During Therapy: [facility] for tasks assessed/performed ?Overall Cognitive Status: Within Functional Limits for tasks assessed ?  ?  ?  ?  ?  ?  ?  ?  ?  ?  ?  ?  ?  ?  ?  ?  ?  ?  ?  ?   ?Exercises   ? ?  ?Shoulder Instructions   ? ? ?  ?General Comments    ? ? ?Pertinent Vitals/ Pain       Pain Assessment ?Pain Assessment: No/denies pain ? ?Home Living   ?  ?  ?  ?  ?  ?  ?  ?  ?  ?  ?  ?  ?  ?  ?  ?  ?  ?  ? ?  ?Prior Functioning/Environment    ?  ?  ?  ?   ? ?Frequency ? Min 2X/week  ? ? ? ? ?  ?Progress Toward Goals ? ?OT Goals(current goals can now be found in the care plan section) ? Progress towards OT goals: Progressing toward goals ? ?  Acute Rehab OT Goals ?OT Goal Formulation: With patient ?Time For Goal Achievement: 11/07/21 ?Potential to Achieve Goals: Good  ?Plan Discharge plan remains appropriate   ? ?Co-evaluation ? ? ?   ?  ?  ?  ?  ? ?  ?AM-PAC OT "6 Clicks" Daily Activity     ?Outcome Measure ? ? Help from another person eating meals?: None ?Help from another person taking care of personal grooming?: A Little ?Help from another person toileting, which includes using toliet, bedpan, or urinal?: A Little ?Help from another person bathing (including washing, rinsing, drying)?: A Little ?Help from another person to put on and taking off regular upper body clothing?: None ?Help from another person to put on and taking off regular lower body clothing?: A Little ?6 Click Score: 20 ? ?  ?End of Session   ? ?OT Visit Diagnosis: Unsteadiness on feet (R26.81);Other abnormalities of gait and mobility (R26.89);Muscle weakness (generalized) (M62.81) ?  ?Activity Tolerance Patient tolerated treatment well ?  ?Patient Left in bed (going to IR) ?  ?Nurse  Communication   ?  ? ?   ? ?Time: 9381-8299 ?OT Time Calculation (min): 13 min ? ?Charges: OT General Charges ?$OT Visit: 1 Visit ?OT Treatments ?$Self Care/Home Management : 8-22 mins ? ?Nestor Lewandowsky, OTR/L ?Acute Rehabilitation Services ?Pager: [phone] ?Office: [phone]  ? ?Malka So ?11/04/2021, 10:28 AM ?

## 2021-11-04 NOTE — Telephone Encounter (Signed)
Pt currently still admitted. ?

## 2021-11-04 NOTE — Progress Notes (Signed)
Pt has been taken to IR ?

## 2021-11-04 NOTE — Procedures (Signed)
Interventional Radiology Procedure Note ? ?Procedure: CT guided right thoracostomy tube placement ? ?Findings: Please refer to procedural dictation for full description. Right apical 14 Fr pigtail placed with evacuation of majority of pneumothorax.  Air leak present upon completion. ? ?Complications: None immediate ? ?Estimated Blood Loss: < 5 mL ? ?Recommendations: ?Keep to wall suction for now.   ?Follow up chest radiograph in the morning. ?IR will follow. ? ? ?Ruthann Cancer, MD ?Pager: [phone] ? ? ? ?

## 2021-11-04 NOTE — Telephone Encounter (Signed)
-----   Message from Yetta Flock, MD sent at 11/04/2021  7:20 AM EDT ----- ?Regarding: RE: Hospital follow-up ?Jemmie Rhinehart can you keep an eye out for this patient. Should have follow up with me or APP in 2 weeks or so post discharge. She is still in the hospital, hopefully out of the hospital later this week. She should have a BMET one week after her discharge. Thanks ? ?----- Message ----- ?From: Doran Stabler, MD ?Sent: 11/02/2021  11:33 AM EDT ?To: Yetta Flock, MD ?Subject: Hospital follow-up                            ? ?Richardson Landry, ? ?She got her TIPS on 11/01/2021 and is doing well so far. ? ?She is on lactulose, and I am decreasing the diuretic doses to spironolactone 100/furosemide 40 daily so she does not develop renal dysfunction. ?She is also on lactulose twice daily ? ?I have signed off on the inpatient side, so please keep track of when she is discharged soon and arrange clinic follow-up with you. ? ?HD ? ? ?

## 2021-11-04 NOTE — Progress Notes (Signed)
PT Cancellation Note ? ?Patient Details ?Name: Glender Augusta ?MRN: [id number] ?DOB: 03-20-1966 ? ? ?Cancelled Treatment:    Reason Eval/Treat Not Completed: Other (comment) (Refused due to pain. Just back from IR.) ? ? ?Cain Fitzhenry F Aleiya Rye ?11/04/2021, 2:24 PM ?Ediel Unangst M,PT ?Acute Rehab Services ?[phone] ?[phone] (pager)  ?

## 2021-11-04 NOTE — Consult Note (Signed)
? ?Chief Complaint: ?Patient was seen in consultation today for right chest tube placement ?Chief Complaint  ?Patient presents with  ? Shortness of Breath  ? at the request of Dr Serafina Royals ? ? ?Supervising Physician: Ruthann Cancer ? ?Patient Status: [facility] - In-pt ? ?History of Present Illness: ?Katie Holland is a 56 y.o. female  ? ?Cirrhosis with portal hypertension and recurrent ascites. S/p TIPS creation 11/01/21 by Dr. Laurence Ferrari. ?Rt thoracentesis performed same day: 3 liters ?Paracentesis same day: 3.5 liters ? ?Following CXR secondary small PTX noted post procedure ?Pt remains asymptomatic ?Denies SOB or cough ? ?CXR Yesterday: IMPRESSION: ?Slight interval increase in a right-sided hydropneumothorax, now ?approximately 25% in volume. The left lung is normally aerated. ?CXR this am: IMPRESSION: ?1. Continued interval increase in size of the right ?hydropneumothorax, now measuring up to approximately 2.8 cm ?(previously 1.8 cm). ? ?Now scheduled for Rt chest tube placement per Dr Serafina Royals ? ?Pt is aware and agreeable ? ?Past Medical History:  ?Diagnosis Date  ? Alcoholic liver disease ([place])   ? Chronic back pain   ? on disability  ? Cirrhosis of liver ([place])   ? Esophageal varices ([facility])   ? GERD (gastroesophageal reflux disease)   ? Headache   ? Jaundice   ? ? ?Past Surgical History:  ?Procedure Laterality Date  ? BACK SURGERY    ? L3-L4  ? BIOPSY  08/07/2020  ? Procedure: BIOPSY;  Surgeon: Yetta Flock, MD;  Location: [facility] END[facility];  Service: Gastroenterology;;  ? COLONOSCOPY WITH PROPOFOL N/A 08/01/2019  ? Procedure: COLONOSCOPY WITH PROPOFOL;  Surgeon: Yetta Flock, MD;  Location: [facility] ENDOSCOPY;  Service: Gastroenterology;  Laterality: N/A;  ? ESOPHAGEAL BANDING  04/25/2018  ? Procedure: ESOPHAGEAL BANDING;  Surgeon: Yetta Flock, MD;  Location: [facility] ENDOSCOPY;  Service: Gastroenterology;;  ? ESOPHAGEAL BANDING  04/27/2018  ? Procedure: ESOPHAGEAL BANDING;  Surgeon: Milus Banister, MD;  Location: [facility]  ENDOSCOPY;  Service: Endoscopy;;  ? ESOPHAGEAL BANDING  06/02/2018  ? Procedure: ESOPHAGEAL BANDING;  Surgeon: Doran Stabler, MD;  Location: [place];  Service: Gastroenterology;;  ? ESOPHAGEAL BANDING N/A 07/07/2018  ? Procedure: ESOPHAGEAL BANDING;  Surgeon: Yetta Flock, MD;  Location: [facility] ENDOSCOPY;  Service: Gastroenterology;  Laterality: N/A;  ? ESOPHAGEAL BANDING  07/27/2018  ? Procedure: ESOPHAGEAL BANDING;  Surgeon: Yetta Flock, MD;  Location: [facility] ENDOSCOPY;  Service: Gastroenterology;;  ? ESOPHAGEAL BANDING  08/30/2018  ? Procedure: ESOPHAGEAL BANDING;  Surgeon: Yetta Flock, MD;  Location: [facility] ENDOSCOPY;  Service: Gastroenterology;;  ? ESOPHAGOGASTRODUODENOSCOPY N/A 04/27/2018  ? Procedure: ESOPHAGOGASTRODUODENOSCOPY (EGD);  Surgeon: Milus Banister, MD;  Location: [facility] ENDOSCOPY;  Service: Endoscopy;  Laterality: N/A;  ? ESOPHAGOGASTRODUODENOSCOPY (EGD) WITH PROPOFOL N/A 04/25/2018  ? Procedure: ESOPHAGOGASTRODUODENOSCOPY (EGD) WITH PROPOFOL;  Surgeon: Yetta Flock, MD;  Location: [place];  Service: Gastroenterology;  Laterality: N/A;  ? ESOPHAGOGASTRODUODENOSCOPY (EGD) WITH PROPOFOL N/A 06/02/2018  ? Procedure: ESOPHAGOGASTRODUODENOSCOPY (EGD) WITH PROPOFOL;  Surgeon: Doran Stabler, MD;  Location: [place];  Service: Gastroenterology;  Laterality: N/A;  ? ESOPHAGOGASTRODUODENOSCOPY (EGD) WITH PROPOFOL N/A 07/07/2018  ? Procedure: ESOPHAGOGASTRODUODENOSCOPY (EGD) WITH PROPOFOL;  Surgeon: Yetta Flock, MD;  Location: [facility] ENDOSCOPY;  Service: Gastroenterology;  Laterality: N/A;  ? ESOPHAGOGASTRODUODENOSCOPY (EGD) WITH PROPOFOL N/A 07/27/2018  ? Procedure: ESOPHAGOGASTRODUODENOSCOPY (EGD) WITH PROPOFOL;  Surgeon: Yetta Flock, MD;  Location: [facility] ENDOSCOPY;  Service: Gastroenterology;  Laterality: N/A;  ? ESOPHAGOGASTRODUODENOSCOPY (EGD) WITH PROPOFOL N/A 08/30/2018  ? Procedure: ESOPHAGOGASTRODUODENOSCOPY (EGD) WITH  PROPOFOL;  Surgeon: Yetta Flock, MD;  Location: [facility];  Service: Gastroenterology;  Laterality: N/A;  ? ESOPHAGOGASTRODUODENOSCOPY (EGD) WITH PROPOFOL N/A 11/08/2018  ? Procedure: ESOPHAGOGASTRODUODENOSCOPY (EGD) WITH PROPOFOL;  Surgeon: Yetta Flock, MD;  Location: [facility] ENDOSCOPY;  Service: Gastroenterology;  Laterality: N/A;  ? ESOPHAGOGASTRODUODENOSCOPY (EGD) WITH PROPOFOL N/A 01/31/2019  ? Procedure: ESOPHAGOGASTRODUODENOSCOPY (EGD) WITH PROPOFOL;  Surgeon: Yetta Flock, MD;  Location: [facility] ENDOSCOPY;  Service: Gastroenterology;  Laterality: N/A;  ? ESOPHAGOGASTRODUODENOSCOPY (EGD) WITH PROPOFOL N/A 08/01/2019  ? Procedure: ESOPHAGOGASTRODUODENOSCOPY (EGD) WITH PROPOFOL;  Surgeon: Yetta Flock, MD;  Location: [facility] ENDOSCOPY;  Service: Gastroenterology;  Laterality: N/A;  ? ESOPHAGOGASTRODUODENOSCOPY (EGD) WITH PROPOFOL N/A 08/07/2020  ? Procedure: ESOPHAGOGASTRODUODENOSCOPY (EGD) WITH PROPOFOL;  Surgeon: Yetta Flock, MD;  Location: [facility] ENDOSCOPY;  Service: Gastroenterology;  Laterality: N/A;  ? ESOPHAGOGASTRODUODENOSCOPY (EGD) WITH PROPOFOL N/A 08/22/2021  ? Procedure: ESOPHAGOGASTRODUODENOSCOPY (EGD) WITH PROPOFOL;  Surgeon: Yetta Flock, MD;  Location: [facility] ENDOSCOPY;  Service: Gastroenterology;  Laterality: N/A;  ? IR PARACENTESIS  04/26/2018  ? IR PARACENTESIS  05/18/2018  ? IR PARACENTESIS  05/31/2018  ? IR PARACENTESIS  06/30/2018  ? IR PARACENTESIS  08/05/2018  ? IR PARACENTESIS  08/31/2018  ? IR PARACENTESIS  10/08/2018  ? IR PARACENTESIS  10/28/2018  ? IR PARACENTESIS  02/14/2019  ? IR PARACENTESIS  03/09/2019  ? IR PARACENTESIS  04/05/2019  ? IR PARACENTESIS  05/03/2019  ? IR PARACENTESIS  06/20/2019  ? IR PARACENTESIS  07/19/2019  ? IR PARACENTESIS  08/19/2019  ? IR PARACENTESIS  09/27/2019  ? IR PARACENTESIS  10/07/2019  ? IR PARACENTESIS  10/28/2019  ? IR PARACENTESIS  11/16/2019  ? IR PARACENTESIS  12/30/2019  ? IR PARACENTESIS  01/16/2020  ? IR PARACENTESIS  02/07/2020  ? IR PARACENTESIS  03/06/2020  ? IR PARACENTESIS   04/04/2020  ? IR PARACENTESIS  04/17/2020  ? IR PARACENTESIS  05/04/2020  ? IR PARACENTESIS  06/07/2020  ? IR PARACENTESIS  06/28/2020  ? IR PARACENTESIS  07/27/2020  ? IR PARACENTESIS  08/20/2020  ? IR PARACENTESIS  09/13/2020  ? IR PARACENTESIS  10/04/2020  ? IR PARACENTESIS  10/25/2020  ? IR PARACENTESIS  11/07/2020  ? IR PARACENTESIS  11/30/2020  ? IR PARACENTESIS  12/13/2020  ? IR PARACENTESIS  01/04/2021  ? IR PARACENTESIS  01/25/2021  ? IR PARACENTESIS  02/20/2021  ? IR PARACENTESIS  03/20/2021  ? IR PARACENTESIS  04/18/2021  ? IR PARACENTESIS  05/14/2021  ? IR PARACENTESIS  06/10/2021  ? IR PARACENTESIS  06/27/2021  ? IR PARACENTESIS  07/17/2021  ? IR PARACENTESIS  08/12/2021  ? IR PARACENTESIS  08/23/2021  ? IR PARACENTESIS  09/13/2021  ? IR PARACENTESIS  10/02/2021  ? IR PARACENTESIS  10/22/2021  ? IR PARACENTESIS  11/01/2021  ? IR RADIOLOGIST EVAL & MGMT  12/23/2018  ? IR RADIOLOGIST EVAL & MGMT  10/11/2021  ? IR THORACENTESIS ASP PLEURAL SPACE W/IMG GUIDE  12/10/2018  ? IR THORACENTESIS ASP PLEURAL SPACE W/IMG GUIDE  01/21/2019  ? IR THORACENTESIS ASP PLEURAL SPACE W/IMG GUIDE  10/23/2021  ? IR THORACENTESIS ASP PLEURAL SPACE W/IMG GUIDE  10/25/2021  ? IR THORACENTESIS ASP PLEURAL SPACE W/IMG GUIDE  11/01/2021  ? IR TIPS  11/01/2021  ? IR US GUIDE VASC ACCESS RIGHT  11/01/2021  ? IR US GUIDE VASC ACCESS RIGHT  11/01/2021  ? IR US GUIDE VASC ACCESS RIGHT  11/01/2021  ? RADIOLOGY WITH ANESTHESIA N/A 11/01/2021  ? Procedure: TIPS;  Surgeon: Criselda Peaches, MD;  Location: [place];  Service: Radiology;  Laterality: N/A;  ? ? ?Allergies: ?Contrast media [iodinated contrast media], Latex, and Sulfa antibiotics ? ?Medications: ?Prior to Admission medications   ?Medication Sig Start Date End Date Taking? Authorizing Provider  ?furosemide (LASIX) 20 MG tablet Take 2 tablets (40 mg total) by mouth 2 (two) times daily. 09/11/21 12/10/21 Yes Armbruster, Carlota Raspberry, MD  ?Multiple Vitamin (MULTIVITAMIN WITH MINERALS) TABS tablet Take 1 tablet by mouth daily.    Yes [provider]  ?prednisoLONE acetate (PRED FORTE) 1 % ophthalmic suspension Place 1 drop into both eyes 2 (two) times daily.   Yes [provider]  ?spironolactone (ALDACTONE

## 2021-11-04 NOTE — Progress Notes (Signed)
PT Cancellation Note ? ?Patient Details ?Name: Katie Holland ?MRN: [id number] ?DOB: [birth year] ? ? ?Cancelled Treatment:    Reason Eval/Treat Not Completed: Patient at procedure or test/unavailable (Pt in IR. Will return as able.) ? ? ?Shatasia Cutshaw F Jillianne Gamino ?11/04/2021, 10:34 AM ?Arrie Aran M,PT ?Acute Rehab Services ?[phone] ?[phone] (pager)  ?

## 2021-11-04 NOTE — Progress Notes (Signed)
? ?PROGRESS NOTE ? ? ? ?Katie Holland  JQB:[id number] DOB: 12/24/1965 DOA: 10/22/2021 ?PCP: Billie Ruddy, MD ? ? ?Brief Narrative: ?The patient is a 56 year old African-American female with past medical history significant for but not limited to alcoholic liver failure, history of alcohol abuse, history of tobacco abuse, history of esophageal varices, history of upper GI bleed as well as other comorbidities who presented with worsening shortness of breath and increased O2 requirement.  She underwent an ultrasound-guided therapeutic paracentesis that yielded about 9.8 L of clear yellow fluid and had tolerated well and was administered 75 g of albumin.  Subsequently she is noted to be short of breath and had an elevated heart rate and high blood pressure even after paracentesis.  She is noted to be saturating 89% and increased work of bleeding and to be placed on 4 L of supplemental oxygen which improved her O2 saturations 96%.  There is attempt to wean her from her 4 L but because she failed to do so she was sent the ED for further evaluation.  She is noted to be somewhat more lethargic than usual and she was on home Lasix and spironolactone.  She also had some abdominal pain and does not drink anymore and her last drink was 3 months ago.  She continues to smoke cigarettes 6 to 7 cigarettes a day and think about quitting.  She had no bleeding and her last thoracentesis was 9 months ago she stated.  While in the ED she is noted to have a lactic acid of 2.3 and a chest x-ray that was worrisome for her right middle right lower lobe large right pleural effusion but also could be pneumonia versus CHF.  GI was notified of patient's admission and blood cultures and sputum cultures were obtained.  She underwent a CT of the chest which showed a right large sided pleural effusion with right middle lobe and right lower lobe consolidation as well as residual ascites in the abdomen following paracentesis.  She had cirrhotic  changes of the liver that were seen.  She is admitted for severe sepsis given that her lactic acid was level greater than 2 and that she had new oxygen requirement of 4 L.  She was placed on antibiotics azithromycin and ceftriaxone.  She is found to have 1 out of 4 bottles of E. coli bacteremia with possible pneumonia and she is continued on ceftriaxone and azithromycin.  IR was also consulted and she underwent a 1.8 L thoracentesis and there was question removed with there this was a hepatic hydrothorax.  She is continue on ceftriaxone and azithromycin and she did have large ascites that is refractory to high-dose diuretics.  GI evaluated and recommended resuming her home diuretic doses of Lasix 40 mg twice daily spironolactone 20 mg daily follow-up for a low sodium diet.  Patient was stressed to get a TIPS however this was canceled in the setting of her bacteremia ? ?GI has initiated her back on her home diuretics but if her renal function is okay there we will try increasing as she tolerates tomorrow.  They are recommending a discharge that she stay on Lasix 40 mg twice daily and spironolactone 200 g daily but they may increase it while she is in house.  Repeat chest x-ray yesterday showed a moderate pleural effusion again and today CXR showed "Large right pleural effusion, increased in size since the study from 1 day prior with worsened aeration of the right lung." ? ?Repeat CXR showed worsening  large right pleural effusion and because of her shortness of breath while ambulation a repeat thoracentesis was ordered.  GI recommending continuing diuretics and may increase the dose and recommending continuing low-sodium diet. ? ?She is s/p Thoracentesis again and 1 Liter was removed on 10/25/21.  GI has increased her diuretics to 40 mg of Lasix 3 times daily and 300 mg spironolactone daily however yesterday when I  walked in the room she was eating a large bag of Lays potato chips so I advised her about her salt  intake. ? ?10/27/21 she was actively vomiting and retching and did not feel as good. She is improved 10/28/21 but back on Supplemental O2 and complains of slight SOB.  GI will reach out to interventional radiology about timing of the TIPS and will continue antibiotics for 7 days for her E. coli bacteremia.  GI recommending continuing current dose of diuretics and not escalating dose yet ? ?10/28/21 Had worsening of he Pleural Effusion and had some dyspnea so Pulmonary was consulted who feels that she has a hepatic hydrothorax and recommending definitive management with TIPS but recommending another thoracentesis for comfort if she becomes more dyspenic.  GI spoken with IR and they will write a note outlining the plans regarding future TIPS.  Her antibiotics were switched from ceftriaxone to Ancef for 2 more day to complete 7 days of gram-negative coverage. ? ?TIPS performed successfully on 5/12. Post repeat thoracentesis, patient developed worsening right-sided pneumothorax. IR to place right chest tube on 5/15. ? ?Assessment and Plan: ? ?Severe sepsis ?Secondary to E. Coli bacteremia and pneumonia. Resolved with antibiotics. ? ?E. Coli bacteremia ?Patient treated with Ceftriaxone IV and transitioned to Cefazolin. Completed a 7 day course of antibiotics. Likely translocation from GI source. Resolved. ? ?Community acquired pneumonia ?Patient treated with Ceftriaxone and azithromycin. Completed course. ? ?Acute respiratory failure with hypoxia ?Secondary to pneumonia and pleural effusion from hepatic hydrothorax. Patient is s/p multiple thoracenteses. Weaned to room air. Resolved. ? ?Decompensated alcoholic cirrhosis ?Ascites ?Esophageal varices ?GI on board. Patient is currently on high dose diuretic therapy. GI recommending TIPS. IR consulted for TIPS which was successfully completed on 5/12. ?-Continue Lasix and Spironolactone ?-Continue lactulose ?-IR follow-up as an outpatient ? ?Right-sided  pneumothorax ?Post-thoracentesis. Patient is on room air. Repeat chest x-ray shows expanding pneumothorax. Patient still remains asymptomatic. ?-IR recommendations: chest tube today ? ?Hyperammoniemia ?No note of hepatic encephalopathy, but patient developed asterixis. Lactulose therapy initiated. ? ?Elevated troponin ?Troponin peak of 234. Assessment is demand ischemia. ? ?Non thyroidal illness ?Likely diagnosis in critically ill patient. Low TSH and T3 with normal free T4. Recommend repeat thyroid testing as an outpatient when no longer acutely ill. ? ?Microcytic anemia ?B12 Deficiency ?Chronic and stable hemoglobin. ?-Continue B12 supplementation ? ?Vitamin D deficiency ?Vitamin B6 deficiency ?Zinc deficiency ?-Continue vitamin D3, vitamin B6 and zine supplementation ? ?Nausea/vomiting ?Resolved. ? ?Thrombocytopenia ?Mild. In setting of liver disease. ? ?Lactic acidosis ?In setting of liver failure and sepsis.  ? ?Severe malnutrition ?In setting of chronic illness. Dietitian consulted.  ?Dietitian recommendations (5/10): ?Continue Multivitamin w/ minerals daily ?Continue Boost Plus po BID, provides 360 kcal and 14 grams of protein each. ?Encourage good PO intake  ? ?DVT prophylaxis: SCDs ?Code Status:   Code Status: DNR ?Family Communication: None at bedside ?Disposition Plan: Discharge home with home health likely in 2-4 days pending IR management of pneumothorax. Home with home health. ? ? ?Consultants:  ?PCCM ?[facility] ?Interventional radiology ? ?Procedures:  ?Paracentesis (10/22/2021) ?  Right thoracentesis (10/23/2021) ?Right thoracentesis (10/25/2021) ? ?Antimicrobials: ?Ceftriaxone IV ?Azithromycin IV ?Cefazolin IV  ? ? ?Subjective: ?No dyspnea or chest pain.  ? ?Objective: ?BP 105/65 (BP Location: Right Arm)   Pulse 96   Temp 98.2 ?F (36.8 ?C) (Oral)   Resp (!) 25   Ht '5\' 4"'$  (1.626 m)   Wt 57.5 kg   SpO2 97%   BMI 21.75 kg/m?  ? ?Examination: ? ?General exam: Appears calm and  comfortable ?Respiratory system: Clear to auscultation but maybe slightly diminished on right. Respiratory effort normal. ?Cardiovascular system: S1 & S2 heard, RRR. No murmurs, rubs, gallops or clicks. ?Gastrointestinal system: Abdomen is nondistended, soft

## 2021-11-05 ENCOUNTER — Encounter (HOSPITAL_COMMUNITY): Payer: Self-pay

## 2021-11-05 ENCOUNTER — Inpatient Hospital Stay (HOSPITAL_COMMUNITY): Payer: Medicare HMO

## 2021-11-05 DIAGNOSIS — A419 Sepsis, unspecified organism: Secondary | ICD-10-CM | POA: Diagnosis not present

## 2021-11-05 DIAGNOSIS — J9601 Acute respiratory failure with hypoxia: Secondary | ICD-10-CM | POA: Diagnosis not present

## 2021-11-05 DIAGNOSIS — J189 Pneumonia, unspecified organism: Secondary | ICD-10-CM | POA: Diagnosis not present

## 2021-11-05 DIAGNOSIS — K7031 Alcoholic cirrhosis of liver with ascites: Secondary | ICD-10-CM | POA: Diagnosis not present

## 2021-11-05 HISTORY — PX: IR INTRAVASCULAR ULTRASOUND NON CORONARY: IMG6085

## 2021-11-05 LAB — TYPE AND SCREEN
ABO/RH(D): O POS
Antibody Screen: POSITIVE
Unit division: 0
Unit division: 0

## 2021-11-05 LAB — BASIC METABOLIC PANEL
Anion gap: 6 (ref 5–15)
BUN: 13 mg/dL (ref 6–20)
CO2: 27 mmol/L (ref 22–32)
Calcium: 8.4 mg/dL — ABNORMAL LOW (ref 8.9–10.3)
Chloride: 97 mmol/L — ABNORMAL LOW (ref 98–111)
Creatinine, Ser: 0.71 mg/dL (ref 0.44–1.00)
GFR, Estimated: >60 mL/min (ref >60)
Glucose, Bld: 131 mg/dL — ABNORMAL HIGH (ref 70–99)
Potassium: 4.3 mmol/L (ref 3.5–5.1)
Sodium: 130 mmol/L — ABNORMAL LOW (ref 135–145)

## 2021-11-05 LAB — BPAM RBC
Blood Product Expiration Date: 202306062359
Blood Product Expiration Date: 202306092359
Unit Type and Rh: 5100
Unit Type and Rh: 5100

## 2021-11-05 NOTE — Progress Notes (Signed)
? ? ?Referring Physician(s): Dr. Havery Moros ? ?Supervising Physician: Juliet Rude ? ?Patient Status:  [facility] - In-pt ? ?Chief Complaint: Cirrhosis with portal hypertension and recurrent ascites. S/p TIPS creation 11/01/21 by Dr. Laurence Ferrari. ? ?Subjective: Patient resting in bed awake/alert. She denies any significant pain or discomfort. Chest tube to suction.  ? ?Allergies: ?Contrast media [iodinated contrast media], Latex, and Sulfa antibiotics ? ?Medications: ?Prior to Admission medications   ?Medication Sig Start Date End Date Taking? Authorizing Provider  ?furosemide (LASIX) 20 MG tablet Take 2 tablets (40 mg total) by mouth 2 (two) times daily. 09/11/21 12/10/21 Yes Armbruster, Carlota Raspberry, MD  ?Multiple Vitamin (MULTIVITAMIN WITH MINERALS) TABS tablet Take 1 tablet by mouth daily.   Yes [provider]  ?prednisoLONE acetate (PRED FORTE) 1 % ophthalmic suspension Place 1 drop into both eyes 2 (two) times daily.   Yes [provider]  ?spironolactone (ALDACTONE) 100 MG tablet Take 2 tablets (200 mg total) by mouth once daily 09/11/21  Yes Armbruster, Carlota Raspberry, MD  ? ? ? ?Vital Signs: ?BP 97/63 (BP Location: Left Arm)   Pulse 78   Temp (!) 97.3 ?F (36.3 ?C) (Oral)   Resp 17   Ht '5\' 4"'$  (1.626 m)   Wt 126 lb 11.2 oz (57.5 kg)   SpO2 99%   BMI 21.75 kg/m?  ? ?Physical Exam ?Constitutional:   ?   General: She is not in acute distress. ?Pulmonary:  ?   Effort: Pulmonary effort is normal.  ?   Comments: Right chest tube to suction. Small air leak. Dressing is clean/dry/intact. Serosanguineous output.  ?Abdominal:  ?   Palpations: Abdomen is soft.  ?   Tenderness: There is no abdominal tenderness.  ?Neurological:  ?   Mental Status: She is alert and oriented to person, place, and time.  ? ? ?Imaging: ?IR Tips ? ?Result Date: 11/02/2021 ?CLINICAL DATA:  56 year old female with a history of alcoholic cirrhosis complicated by recurrent large volume ascites and hepatic hydrothorax. She presents for  paracentesis, thoracentesis and TIPS creation. EXAM: 1. Ultrasound-guided paracentesis 2. Ultrasound-guided right thoracentesis 3. Ultrasound-guided right common femoral venous access for introduction of intravascular ultrasound 4. TIPS creation MEDICATIONS: As antibiotic prophylaxis, 1 g Rocephin was ordered pre-procedure and administered intravenously within one hour of incision. ANESTHESIA/SEDATION: General - as administered by the Anesthesia department CONTRAST:  110 mL Omnipaque 300 FLUOROSCOPY TIME:  Radiation exposure index: 461 mGy reference air kerma COMPLICATIONS: None immediate. PROCEDURE: Informed written consent was obtained from the patient after a thorough discussion of the procedural risks, benefits and alternatives. All questions were addressed. Maximal Sterile Barrier Technique was utilized including caps, mask, sterile gowns, sterile gloves, sterile drape, hand hygiene and skin antiseptic. A timeout was performed prior to the initiation of the procedure. PARACENTESIS The abdomen was interrogated with ultrasound. There is a large amount of free ascites. A suitable skin entry site was selected and marked. The region was sterilely prepped and draped in the standard fashion using chlorhexidine skin prep. Local anesthesia was attained by infiltration with 1% lidocaine. A small dermatotomy was made. Under ultrasound guidance, a 6 French Safe-T-Centesis catheter was advanced into the fluid collection. Paracentesis was then performed yielding approximately 3.5 L of ascites. The catheter was removed and a bandage applied. THORACENTESIS The right chest was interrogated with ultrasound. There is a large right-sided pleural effusion. A suitable skin entry site was selected and marked. The region was sterilely prepped and draped in the standard fashion using chlorhexidine skin  prep. Local anesthesia was attained by infiltration with 1% lidocaine. A small dermatotomy was made. Under ultrasound guidance, a 6  French Safe-T-Centesis catheter was advanced into the right pleural fluid. Thoracentesis was performed yielding 3 L of straw-colored pleural fluid. The catheter was removed. A bandage was applied. INTRAVASCULAR [place] The right common femoral vein was interrogated with ultrasound and found to be widely patent. An image was obtained and stored for the medical record. Local anesthesia was attained by infiltration with 1% lidocaine. A small dermatotomy was made. Under real-time sonographic guidance, the vessel was punctured with a 21 gauge micropuncture needle. Using standard technique, the initial micro needle was exchanged over a 0.018 micro wire for a transitional 4 [place] micro sheath. The micro sheath was then exchanged over a 0.035 wire for a 9 French vascular sheath. The 8 French intracardiac echocardiography (ICE) catheter was then advanced over a Bentson wire into the inferior cavoatrial junction. Intravascular ultrasound was then performed evaluating the supra hepatic and intra hepatic IVC, hepatic veins and portal veins. The portal veins are diminutive but patent. TIPS The right internal jugular vein was interrogated with ultrasound and found to be widely patent. An image was obtained and stored for the medical record. Local anesthesia was attained by infiltration with 1% lidocaine. A small dermatotomy was made. Under real-time sonographic guidance, the vessel was punctured with a 21 gauge micropuncture needle. Using standard technique, the initial micro needle was exchanged over a 0.018 micro wire for a transitional 4 [place] micro sheath. The micro sheath was then exchanged over a 0.035 wire for a 5 French vascular sheath. Attention was turned to the right abdomen. The abdomen was interrogated with ultrasound. The liver is shrunken and extremely heterogeneous. The gallbladder is mildly distended. There was great difficulty in identifying the diminutive portal veins. Several small portal venous radicles were  accessed, however the trajectory would not facilitate passage of a wire into the central portal venous system. Ultimately, using a combination of mid epigastric transabdominal ultrasound guidance, and a right lateral intra axillary needle approach, a 22 gauge needle was successfully advanced along a transhepatic course from lateral to medial and used to select a peripheral branch of the right portal vein. A 0.018 wire was navigated in the central portal veins. The needle was then exchanged and a 5 [place] Accustick sheath advanced over the needle and into the main portal vein. Transhepatic portal venography was then performed. The intrahepatic and main portal veins are markedly diminutive. The 5 French catheter is essentially occlusive in the right portal vein. There is marked hypertrophy of the posterior gastric vein filling a large portosystemic collateral. The Nitinol portion of the 0.018 wire was inserted with the tip at the portal venous confluence to provide an intra portal marker. Attention was turned back to the 5 French sheath in the right internal jugular vein. The sheath was aspirated and flushed. A wire was introduced and passed through the heart into the inferior vena cava. The 5 French sheath was removed. The skin tract was dilated to 10 [place] and a 9 [place] braided hydrophilic tips sheath advanced over the wire and positioned in the inferior vena cava. A 5 [place] MPA catheter was inserted coaxially through the sheath and the sheath was brought back into the right atrium. The catheter was then manipulated into the origin of the middle hepatic vein and the middle hepatic vein was selected over a Bentson wire. Once the catheter was in the middle hepatic vein, a hepatic venogram  was performed confirming the venous anatomy. It is known that this represents the middle hepatic vein based on evaluation of prior CT imaging which demonstrates a diminutive and extremely posterior right hepatic vein. A superstiff  Amplatz wire was advanced distally into the middle hepatic vein. The tips sheath was advanced over the wire into the middle hepatic vein. Initially, and are gone scorpion tips set was advanced over the wire, through

## 2021-11-05 NOTE — Progress Notes (Signed)
Physical Therapy Treatment ?Patient Details ?Name: Katie Holland ?MRN: [id number] ?DOB: 1966/01/02 ?Today's Date: 11/05/2021 ? ? ?History of Present Illness 56 y/o female presented to ED on 10/22/21 for SOB and chest tightness after she received paracentesis earlier that day. Admitted for sepsis 2/2 E. coli bacteremia and CAP. Underwent TIPS 11/01/21. Chest tube placement in R side on 5/15. PMH: ETOH cirrhosis ? ?  ?PT Comments  ? ? Pt underwent chest tube placement on R side yesterday. Pt with 8/10 pain but agreeable to try ambulation. Pt requiring minA for transfers and RW for ambulation however suspect pt to progress well once pain under control. Encouraged pt to sit up for all meals. Pt agreeable. Acute PT to cont to follow. ?   ?Recommendations for follow up therapy are one component of a multi-disciplinary discharge planning process, led by the attending physician.  Recommendations may be updated based on patient status, additional functional criteria and insurance authorization. ? ?Follow Up Recommendations ? Home health PT ?  ?  ?Assistance Recommended at Discharge Intermittent Supervision/Assistance  ?Patient can return home with the following A little help with walking and/or transfers;A little help with bathing/dressing/bathroom;Assistance with cooking/housework;Assist for transportation;Help with stairs or ramp for entrance ?  ?Equipment Recommendations ? Rolling walker (2 wheels)  ?  ?Recommendations for Other Services   ? ? ?  ?Precautions / Restrictions Precautions ?Precautions: Fall ?Precaution Comments: R chest tube ?Restrictions ?Weight Bearing Restrictions: No  ?  ? ?Mobility ? Bed Mobility ?Overal bed mobility: Needs Assistance ?Bed Mobility: Sidelying to Sit, Sit to Sidelying ?  ?Sidelying to sit: Min assist ?  ?  ?Sit to sidelying: Min assist ?General bed mobility comments: minA for trunk elevation due to pain ?  ? ?Transfers ?Overall transfer level: Needs assistance ?Equipment used: Rolling walker (2  wheels) ?Transfers: Sit to/from Stand ?Sit to Stand: Min guard ?  ?  ?  ?  ?  ?General transfer comment: Min guard for safety, slow to move, guarded due to R flank pain at chest tube site ?  ? ?Ambulation/Gait ?Ambulation/Gait assistance: Min assist ?Gait Distance (Feet): 60 Feet ?Assistive device: Rolling walker (2 wheels) ?Gait Pattern/deviations: Step-through pattern, Decreased stride length, Trunk flexed ?Gait velocity: decreased ?Gait velocity interpretation: <1.31 ft/sec, indicative of household ambulator ?  ?General Gait Details: minA for walker management, slow, trunk flexion, guarded due to pain in R flank ? ? ?Stairs ?  ?  ?  ?  ?  ? ? ?Wheelchair Mobility ?  ? ?Modified Rankin (Stroke Patients Only) ?  ? ? ?  ?Balance Overall balance assessment: Needs assistance ?Sitting-balance support: No upper extremity supported, Feet supported ?Sitting balance-Leahy Scale: Good ?  ?  ?Standing balance support: During functional activity, Bilateral upper extremity supported ?Standing balance-Leahy Scale: Fair ?Standing balance comment: needs walker at this time due to R flank pain ?  ?  ?  ?  ?  ?  ?  ?  ?  ?  ?  ?  ? ?  ?Cognition Arousal/Alertness: Awake/alert ?Behavior During Therapy: [facility] for tasks assessed/performed ?Overall Cognitive Status: Within Functional Limits for tasks assessed ?  ?  ?  ?  ?  ?  ?  ?  ?  ?  ?  ?  ?  ?  ?  ?  ?  ?  ?  ? ?  ?Exercises   ? ?  ?General Comments General comments (skin integrity, edema, etc.): R flank with chest tube intact with dressing ?  ?  ? ?  Pertinent Vitals/Pain Pain Assessment ?Pain Assessment: 0-10 ?Faces Pain Scale: Hurts whole lot ?Pain Location: chest tube site  ? ? ?Home Living   ?  ?  ?  ?  ?  ?  ?  ?  ?  ?   ?  ?Prior Function    ?  ?  ?   ? ?PT Goals (current goals can now be found in the care plan section) Acute Rehab PT Goals ?Patient Stated Goal: to improve ?PT Goal Formulation: With patient/family ?Time For Goal Achievement: 11/07/21 ?Potential to Achieve  Goals: Good ?Progress towards PT goals: Progressing toward goals ? ?  ?Frequency ? ? ? Min 3X/week ? ? ? ?  ?PT Plan Current plan remains appropriate  ? ? ?Co-evaluation   ?  ?  ?  ?  ? ?  ?AM-PAC PT "6 Clicks" Mobility   ?Outcome Measure ? Help needed turning from your back to your side while in a flat bed without using bedrails?: None ?Help needed moving from lying on your back to sitting on the side of a flat bed without using bedrails?: None ?Help needed moving to and from a bed to a chair (including a wheelchair)?: A Little ?Help needed standing up from a chair using your arms (e.g., wheelchair or bedside chair)?: A Little ?Help needed to walk in hospital room?: A Little ?Help needed climbing 3-5 steps with a railing? : A Little ?6 Click Score: 20 ? ?  ?End of Session   ?Activity Tolerance: Patient tolerated treatment well ?Patient left: with call bell/phone within reach;with family/visitor present;in bed;with nursing/sitter in room ?Nurse Communication: Mobility status ?PT Visit Diagnosis: Muscle weakness (generalized) (M62.81);Unsteadiness on feet (R26.81);Pain;Other abnormalities of gait and mobility (R26.89);Difficulty in walking, not elsewhere classified (R26.2) ?  ? ? ?Time: 2725-3664 ?PT Time Calculation (min) (ACUTE ONLY): 16 min ? ?Charges:  $Gait Training: 8-22 mins          ?          ? ?Kittie Plater, PT, DPT ?Acute Rehabilitation Services ?Secure chat preferred ?Office #: [phone] ? ? ? ?Jamari Diana M Elise Knobloch ?11/05/2021, 10:59 AM ? ?

## 2021-11-05 NOTE — Progress Notes (Signed)
? ?PROGRESS NOTE ? ? ? ?Katie Holland  ERD:[id number] DOB: Nov 12, 1965 DOA: 10/22/2021 ?PCP: Billie Ruddy, MD ? ? ?Brief Narrative: ?The patient is a 56 year old African-American female with past medical history significant for but not limited to alcoholic liver failure, history of alcohol abuse, history of tobacco abuse, history of esophageal varices, history of upper GI bleed as well as other comorbidities who presented with worsening shortness of breath and increased O2 requirement.  She underwent an ultrasound-guided therapeutic paracentesis that yielded about 9.8 L of clear yellow fluid and had tolerated well and was administered 75 g of albumin.  Subsequently she is noted to be short of breath and had an elevated heart rate and high blood pressure even after paracentesis.  She is noted to be saturating 89% and increased work of bleeding and to be placed on 4 L of supplemental oxygen which improved her O2 saturations 96%.  There is attempt to wean her from her 4 L but because she failed to do so she was sent the ED for further evaluation.  She is noted to be somewhat more lethargic than usual and she was on home Lasix and spironolactone.  She also had some abdominal pain and does not drink anymore and her last drink was 3 months ago.  She continues to smoke cigarettes 6 to 7 cigarettes a day and think about quitting.  She had no bleeding and her last thoracentesis was 9 months ago she stated.  While in the ED she is noted to have a lactic acid of 2.3 and a chest x-ray that was worrisome for her right middle right lower lobe large right pleural effusion but also could be pneumonia versus CHF.  GI was notified of patient's admission and blood cultures and sputum cultures were obtained.  She underwent a CT of the chest which showed a right large sided pleural effusion with right middle lobe and right lower lobe consolidation as well as residual ascites in the abdomen following paracentesis.  She had cirrhotic  changes of the liver that were seen.  She is admitted for severe sepsis given that her lactic acid was level greater than 2 and that she had new oxygen requirement of 4 L.  She was placed on antibiotics azithromycin and ceftriaxone.  She is found to have 1 out of 4 bottles of E. coli bacteremia with possible pneumonia and she is continued on ceftriaxone and azithromycin.  IR was also consulted and she underwent a 1.8 L thoracentesis and there was question removed with there this was a hepatic hydrothorax.  She is continue on ceftriaxone and azithromycin and she did have large ascites that is refractory to high-dose diuretics.  GI evaluated and recommended resuming her home diuretic doses of Lasix 40 mg twice daily spironolactone 20 mg daily follow-up for a low sodium diet.  Patient was stressed to get a TIPS however this was canceled in the setting of her bacteremia ? ?GI has initiated her back on her home diuretics but if her renal function is okay there we will try increasing as she tolerates tomorrow.  They are recommending a discharge that she stay on Lasix 40 mg twice daily and spironolactone 200 g daily but they may increase it while she is in house.  Repeat chest x-ray yesterday showed a moderate pleural effusion again and today CXR showed "Large right pleural effusion, increased in size since the study from 1 day prior with worsened aeration of the right lung." ? ?Repeat CXR showed worsening  large right pleural effusion and because of her shortness of breath while ambulation a repeat thoracentesis was ordered.  GI recommending continuing diuretics and may increase the dose and recommending continuing low-sodium diet. ? ?She is s/p Thoracentesis again and 1 Liter was removed on 10/25/21.  GI has increased her diuretics to 40 mg of Lasix 3 times daily and 300 mg spironolactone daily however yesterday when I  walked in the room she was eating a large bag of Lays potato chips so I advised her about her salt  intake. ? ?10/27/21 she was actively vomiting and retching and did not feel as good. She is improved 10/28/21 but back on Supplemental O2 and complains of slight SOB.  GI will reach out to interventional radiology about timing of the TIPS and will continue antibiotics for 7 days for her E. coli bacteremia.  GI recommending continuing current dose of diuretics and not escalating dose yet ? ?10/28/21 Had worsening of he Pleural Effusion and had some dyspnea so Pulmonary was consulted who feels that she has a hepatic hydrothorax and recommending definitive management with TIPS but recommending another thoracentesis for comfort if she becomes more dyspenic.  GI spoken with IR and they will write a note outlining the plans regarding future TIPS.  Her antibiotics were switched from ceftriaxone to Ancef for 2 more day to complete 7 days of gram-negative coverage. ? ?TIPS performed successfully on 5/12. Post repeat thoracentesis, patient developed worsening right-sided pneumothorax. IR placed right chest tube on 5/15. ? ?Assessment and Plan: ? ?Severe sepsis ?Secondary to E. Coli bacteremia and pneumonia. Resolved with antibiotics. ? ?E. Coli bacteremia ?Patient treated with Ceftriaxone IV and transitioned to Cefazolin. Completed a 7 day course of antibiotics. Likely translocation from GI source. Resolved. ? ?Community acquired pneumonia ?Patient treated with Ceftriaxone and azithromycin. Completed course. ? ?Acute respiratory failure with hypoxia ?Secondary to pneumonia and pleural effusion from hepatic hydrothorax. Patient is s/p multiple thoracenteses. Weaned to room air. Resolved. ? ?Decompensated alcoholic cirrhosis ?Ascites ?Esophageal varices ?GI on board. Patient is currently on high dose diuretic therapy. GI recommending TIPS. IR consulted for TIPS which was successfully completed on 5/12. ?-Continue Lasix and Spironolactone ?-Continue lactulose ?-IR follow-up as an outpatient ? ?Right-sided  pneumothorax ?Post-thoracentesis. Patient is on room air. Repeat chest x-ray shows expanding pneumothorax. Right chest tube placed on 5/15 per IR ?-Management per IR ? ?Hyperammoniemia ?No note of hepatic encephalopathy, but patient developed asterixis. Lactulose therapy initiated. ? ?Elevated troponin ?Troponin peak of 234. Assessment is demand ischemia. ? ?Non thyroidal illness ?Likely diagnosis in critically ill patient. Low TSH and T3 with normal free T4. Recommend repeat thyroid testing as an outpatient when no longer acutely ill. ? ?Microcytic anemia ?B12 Deficiency ?Chronic and stable hemoglobin. ?-Continue B12 supplementation ? ?Vitamin D deficiency ?Vitamin B6 deficiency ?Zinc deficiency ?-Continue vitamin D3, vitamin B6 and zine supplementation ? ?Nausea/vomiting ?Resolved. ? ?Thrombocytopenia ?Mild. In setting of liver disease. ? ?Lactic acidosis ?In setting of liver failure and sepsis.  ? ?Severe malnutrition ?In setting of chronic illness. Dietitian consulted.  ?Dietitian recommendations (5/10): ?Continue Multivitamin w/ minerals daily ?Continue Boost Plus po BID, provides 360 kcal and 14 grams of protein each. ?Encourage good PO intake  ? ?DVT prophylaxis: SCDs ?Code Status:   Code Status: DNR ?Family Communication: Husband at bedside ?Disposition Plan: Discharge home with home health likely in 1-3 days pending IR management of pneumothorax. Home with home health. ? ? ?Consultants:  ?PCCM ?[facility] ?Interventional radiology ? ?Procedures:  ?Paracentesis (  10/22/2021) ?Right thoracentesis (10/23/2021) ?Right thoracentesis (10/25/2021) ? ?Antimicrobials: ?Ceftriaxone IV ?Azithromycin IV ?Cefazolin IV  ? ? ?Subjective: ?No issues this morning. ? ?Objective: ?BP 100/61 (BP Location: Left Arm)   Pulse 81   Temp (!) 97.5 ?F (36.4 ?C) (Oral)   Resp 17   Ht '5\' 4"'$  (1.626 m)   Wt 57.5 kg   SpO2 100%   BMI 21.75 kg/m?  ? ?Examination: ? ?General exam: Appears calm and comfortable ?Respiratory  system: Clear to auscultation. Respiratory effort normal. ?Cardiovascular system: S1 & S2 heard, RRR. No murmurs, rubs, gallops or clicks. ?Gastrointestinal system: Abdomen is nondistended, soft and nontender. No organomegaly or masses felt. Normal bow

## 2021-11-05 NOTE — Progress Notes (Signed)
Nutrition Follow-up ? ?DOCUMENTATION CODES:  ? ?Severe malnutrition in context of chronic illness ? ?INTERVENTION:  ? ?Continue Multivitamin w/ minerals daily ?Continue Boost Plus po BID, provides 360 kcal and 14 grams of protein each. ?Encourage good PO intake  ?Recommend obtaining new weight. RN notified.  ? ?NUTRITION DIAGNOSIS:  ? ?Severe Malnutrition related to chronic illness (cirrhosis) as evidenced by severe muscle depletion, severe fat depletion. - Ongoing ? ?GOAL:  ? ?Patient will meet greater than or equal to 90% of their needs - Ongoing ? ?MONITOR:  ? ?PO intake, Supplement acceptance, Labs, Weight trends, I & O's ? ?REASON FOR ASSESSMENT:  ? ?Consult ?Assessment of nutrition requirement/status ? ?ASSESSMENT:  ? ?56 y.o. female presented to the ED with shortness of breath and increase oxygen requirement. PMH includes EtOH abuse, cirrhosis, malnutrition, and esophageal varices. Pt admitted with sepsis.  ? ?5/02 - Paracentesis (9.8 L removed) ?5/03 - Thoracentesis (1.9 L removed) ?5/12 - Thoracentesis (3 L removed), Paracentesis (3.5 L removed), TIPS procedure ?5/15 - R Chest Tube placed ? ?Pt laying in bed at time of RD visit.  ? ?Pt reports that she is eating much better, denies nay nausea or vomiting. Reports that her stomach is smaller since recent paracentesis and TIPS placement.  ?No meal documentation's recorded since last RD visit.  ?This RD observed pt breakfast tray 100% complete at  bedside.  ? ?Pt reports that she is still drinking the Boost and that she loves them. Per EMR, pt is accepting ~60% of them.  ? ?Pt and RD discussed limiting sodium to assist in reducing fluid collection within the body.  ? ?Pt without a new weight in >7 days, recommend obtaining new weight.  ? ?Medications reviewed and include: Vitamin D3, Lasix, Lactulose, MVI, Prednisone, Spironolactone, Vitamin B12, Vitamin B6, Zinc Sulfate ?Labs reviewed: Sodium 130 ? ?Chest Tube output: 700 mL x 24 hrs ? ?Diet Order:   ?Diet  Order   ? ?       ?  Diet 2 gram sodium Room service appropriate? Yes; Fluid consistency: Thin  Diet effective now       ?  ? ?  ?  ? ?  ? ? ?EDUCATION NEEDS:  ? ?No education needs have been identified at this time ? ?Skin:  Skin Assessment: Reviewed RN Assessment ? ?Last BM:  5/15 ? ?Height:  ? ?Ht Readings from Last 1 Encounters:  ?10/30/21 '5\' 4"'$  (1.626 m)  ? ? ?Weight:  ? ?Wt Readings from Last 1 Encounters:  ?10/24/21 57.5 kg  ? ? ?Ideal Body Weight:  54.6 kg ? ?BMI:  Body mass index is 21.75 kg/m?. ? ?Estimated Nutritional Needs:  ? ?Kcal:  1800-2000 ? ?Protein:  90-105 grams ? ?Fluid:  >/= 1.8 L ? ? ?Hermina Barters RD, LDN ?Clinical Dietitian ?See AMiON for contact information.  ?

## 2021-11-06 ENCOUNTER — Inpatient Hospital Stay (HOSPITAL_COMMUNITY): Payer: Medicare HMO

## 2021-11-06 DIAGNOSIS — J939 Pneumothorax, unspecified: Secondary | ICD-10-CM | POA: Diagnosis not present

## 2021-11-06 DIAGNOSIS — R6520 Severe sepsis without septic shock: Secondary | ICD-10-CM | POA: Diagnosis not present

## 2021-11-06 DIAGNOSIS — K7031 Alcoholic cirrhosis of liver with ascites: Secondary | ICD-10-CM | POA: Diagnosis not present

## 2021-11-06 DIAGNOSIS — A419 Sepsis, unspecified organism: Secondary | ICD-10-CM | POA: Diagnosis not present

## 2021-11-06 LAB — CBC
HCT: 25 % — ABNORMAL LOW (ref 36.0–46.0)
Hemoglobin: 8.4 g/dL — ABNORMAL LOW (ref 12.0–15.0)
MCH: 24.3 pg — ABNORMAL LOW (ref 26.0–34.0)
MCHC: 33.6 g/dL (ref 30.0–36.0)
MCV: 72.3 fL — ABNORMAL LOW (ref 80.0–100.0)
Platelets: 102 10*3/uL — ABNORMAL LOW (ref 150–400)
RBC: 3.46 MIL/uL — ABNORMAL LOW (ref 3.87–5.11)
RDW: 22.6 % — ABNORMAL HIGH (ref 11.5–15.5)
WBC: 5.4 10*3/uL (ref 4.0–10.5)
nRBC: 0 % (ref 0.0–0.2)

## 2021-11-06 LAB — BASIC METABOLIC PANEL
Anion gap: 6 (ref 5–15)
BUN: 9 mg/dL (ref 6–20)
CO2: 27 mmol/L (ref 22–32)
Calcium: 8.5 mg/dL — ABNORMAL LOW (ref 8.9–10.3)
Chloride: 97 mmol/L — ABNORMAL LOW (ref 98–111)
Creatinine, Ser: 0.58 mg/dL (ref 0.44–1.00)
GFR, Estimated: >60 mL/min (ref >60)
Glucose, Bld: 109 mg/dL — ABNORMAL HIGH (ref 70–99)
Potassium: 4.5 mmol/L (ref 3.5–5.1)
Sodium: 130 mmol/L — ABNORMAL LOW (ref 135–145)

## 2021-11-06 MED ORDER — HEPARIN SODIUM (PORCINE) 5000 UNIT/ML IJ SOLN
5000.0000 [IU] | Freq: Three times a day (TID) | INTRAMUSCULAR | Status: DC
  Administered 2021-11-06 – 2021-11-08 (×6): 5000 [IU] via SUBCUTANEOUS
  Filled 2021-11-06 (×6): qty 1

## 2021-11-06 NOTE — Consult Note (Addendum)
? ?  [facility] CM Inpatient Consult ? ? ?11/06/2021 ? ?Katie Holland ?1965-10-14 ?[id number] ? ?Follow up:  Progress [facility] 15 days ? ?PCP:  Billie Ruddy, MD, [place]  ? ? ?Came by to check on patient and was with nursing staff at bedside. Will continue to follow up. ? ?Spoke with inpatient TOC RNCM and updated this Probation officer on progress. ? ?Plan:  Follow up for ongoing needs for post hospital follow up. ? ?Natividad Brood, RN BSN CCM ?[facility] Liaison ? [phone] business mobile phone ?Toll free office [phone]  ?Fax number: [phone] ?[email or web address] ? ? ?

## 2021-11-06 NOTE — TOC Progression Note (Signed)
Transition of Care (TOC) - Progression Note  ? ? ?Patient Details  ?Name: Katie Holland ?MRN: [id number] ?Date of Birth: 06-Apr-1966 ? ?Transition of Care (TOC) CM/SW Contact  ?Cyndi Bender, RN ?Phone Number: ?11/06/2021, 3:17 PM ? ?Clinical Narrative:    ?The plan is to discharge with home health ,[facility],once stable pending IR management of pneumothorax ? ?Expected Discharge Plan: [place] ?Barriers to Discharge: Continued Medical Work up ? ?Expected Discharge Plan and Services ?Expected Discharge Plan: [place] ?  ?Discharge Planning Services: CM Consult ?Post Acute Care Choice: Durable Medical Equipment, Home Health ?Living arrangements for the past 2 months: [place] ?                ?DME Arranged: 3-N-1, Tub bench ?DME Agency: [employer] ?Date DME Agency Contacted: 10/25/21 ?Time DME Agency Contacted: [phone] ?Representative spoke with at DME Agency: [place] ?HH Arranged: PT, OT ?[place] Agency:  ([facility]) ?Date HH Agency Contacted: 10/25/21 ?Time [place]: 1130 ?Representative spoke with at [place] ? ? ?Social Determinants of Health (SDOH) Interventions ?  ? ?Readmission Risk Interventions ?   ? View : No data to display.  ?  ?  ?  ? ? ?

## 2021-11-06 NOTE — Progress Notes (Signed)
Referring Physician(s): Dr. Adela Lank  Supervising Physician: Gilmer Mor  Patient Status:  [facility] - In-pt  Chief Complaint:  Cirrhosis with portal hypertension and recurrent ascites. S/p TIPS creation 11/01/21 by Dr. Archer Asa. CT guided right thoracostomy tube placement 11/04/21 with Dr. Elby Showers.   Subjective:  Pt sitting upright in bed. She is A&O. She states she reached for food on table and had sharp pain in her back. She adds that she has no appetite and is nauseous. She states that she is feeling better overall.   Allergies: Contrast media [iodinated contrast media], Latex, and Sulfa antibiotics  Medications: Prior to Admission medications   Medication Sig Start Date End Date Taking? Authorizing Provider  furosemide (LASIX) 20 MG tablet Take 2 tablets (40 mg total) by mouth 2 (two) times daily. 09/11/21 12/10/21 Yes Armbruster, Willaim Rayas, MD  Multiple Vitamin (MULTIVITAMIN WITH MINERALS) TABS tablet Take 1 tablet by mouth daily.   Yes [provider]  prednisoLONE acetate (PRED FORTE) 1 % ophthalmic suspension Place 1 drop into both eyes 2 (two) times daily.   Yes [provider]  spironolactone (ALDACTONE) 100 MG tablet Take 2 tablets (200 mg total) by mouth once daily 09/11/21  Yes Armbruster, Willaim Rayas, MD     Vital Signs: BP (!) 90/57 (BP Location: Left Arm)   Pulse 84   Temp 98.4 F (36.9 C) (Oral)   Resp 17   Ht 5\' 4"  (1.626 m)   Wt 126 lb 11.2 oz (57.5 kg)   SpO2 100%   BMI 21.75 kg/m   Physical Exam Vitals reviewed.  Constitutional:      General: She is not in acute distress.    Appearance: She is ill-appearing.  HENT:     Head: Normocephalic and atraumatic.  Eyes:     Extraocular Movements: Extraocular movements intact.     Pupils: Pupils are equal, round, and reactive to light.  Pulmonary:     Effort: Pulmonary effort is normal. No respiratory distress.     Comments: R chest tube unremarkable and  in place to [place] drain. ~1350  cc serosanguinous OP in [place]. Dressing C/D/I Skin:    General: Skin is warm and dry.  Neurological:     Mental Status: She is alert and oriented to person, place, and time.  Psychiatric:        Mood and Affect: Mood normal.        Behavior: Behavior normal.        Thought Content: Thought content normal.        Judgment: Judgment normal.    Imaging: DG Chest Port 1 View  Result Date: 11/06/2021 CLINICAL DATA:  Pneumothorax with right chest tube in place. EXAM: PORTABLE CHEST 1 VIEW COMPARISON:  Portable chest yesterday at 1:21 p.m. FINDINGS: 5:11 a.m., 11/06/2021. Pigtail right chest pleural tube remains in place with pigtail in the lateral aspect of the mid chest. Small right pneumothorax with apical and lateral basal components appears improved, estimated 5% or less volume. The lungs are clear. No pleural effusion is seen. The cardiac size and mediastinal silhouette are normal. No acute osseous abnormalities. IMPRESSION: Small apical/lateral basal right pneumothorax is unchanged. Chest tube positioning is unchanged. No other evidence of acute chest process. Electronically Signed   By: Almira Bar M.D.   On: 11/06/2021 06:40   DG Chest Port 1 View  Result Date: 11/05/2021 CLINICAL DATA:  Follow-up right pneumothorax. EXAM: PORTABLE CHEST 1 VIEW COMPARISON:  Prior today FINDINGS: Right pleural pigtail  catheter remains in place. A small right pneumothorax with both apical and basilar components appears mildly increased in size since prior study today. Both lungs are clear. Heart size is normal. IMPRESSION: Mild increase in size of small right pneumothorax. Electronically Signed   By: Danae Orleans M.D.   On: 11/05/2021 13:42   DG Chest Port 1 View  Result Date: 11/05/2021 CLINICAL DATA:  Chest tube placement for right pneumothorax EXAM: PORTABLE CHEST 1 VIEW COMPARISON:  Chest radiograph from one day prior. FINDINGS: Right chest tube terminates over the peripheral right mid pleural space.  Stable cardiomediastinal silhouette with normal heart size. Tiny residual apical right pneumothorax, less than 5%, substantially decreased. No left pneumothorax. No pleural effusion. Improved right lung aeration with residual mild right basilar atelectasis. Otherwise clear lungs, with no pulmonary edema. IMPRESSION: 1. Tiny residual apical right pneumothorax, less than 5%, substantially decreased. Right chest tube in place. 2. Improved right lung aeration with residual mild right basilar atelectasis. Electronically Signed   By: Delbert Phenix M.D.   On: 11/05/2021 10:35   DG Chest Port 1 View  Result Date: 11/04/2021 CLINICAL DATA:  Shortness of breath.  Pneumothorax. EXAM: PORTABLE CHEST 1 VIEW COMPARISON:  Chest x-ray Nov 03, 2021. FINDINGS: Continued interval increase in size of the right hydropneumothorax, now measuring up to approximately 2.8 cm (previously 1.8 cm). No appreciable mediastinal shift. Right basilar opacities, probably atelectasis. Left lung is clear. Cardiomediastinal silhouette is similar to prior and within normal limits. IMPRESSION: 1. Continued interval increase in size of the right hydropneumothorax, now measuring up to approximately 2.8 cm (previously 1.8 cm). 2. Right basilar opacities, probably atelectasis. These results will be called to the ordering clinician or representative by the Radiologist Assistant, and communication documented in the PACS or [employer]. Electronically Signed   By: Feliberto Harts M.D.   On: 11/04/2021 07:39   DG Chest Port 1 View  Result Date: 11/03/2021 CLINICAL DATA:  Pneumothorax EXAM: PORTABLE CHEST 1 VIEW COMPARISON:  11/02/2021 FINDINGS: Slight interval increase in a right-sided hydropneumothorax, now approximately 25% in volume. The left lung is normally aerated. Heart and mediastinum are normal. IMPRESSION: Slight interval increase in a right-sided hydropneumothorax, now approximately 25% in volume. The left lung is normally aerated. These  results will be called to the ordering clinician or representative by the Radiologist Assistant, and communication documented in the PACS or [employer]. Electronically Signed   By: Jearld Lesch M.D.   On: 11/03/2021 09:41   CT IMAGE GUIDED DRAINAGE BY PERCUTANEOUS CATHETER  Result Date: 11/04/2021 INDICATION: 56 year old female status post recent tips creation complicated postprocedure by hydropneumothorax which is enlarging. The patient presents for CT-guided thoracostomy tube. EXAM: CT IMAGE GUIDED DRAINAGE BY PERCUTANEOUS CATHETER COMPARISON:  None Available. MEDICATIONS: The patient is currently admitted to the hospital and receiving intravenous antibiotics. The antibiotics were administered within an appropriate time frame prior to the initiation of the procedure. ANESTHESIA/SEDATION: Moderate (conscious) sedation was employed during this procedure. A total of Versed 1.5 mg and Fentanyl 50 mcg was administered intravenously. Moderate Sedation Time: 15 minutes. The patient's level of consciousness and vital signs were monitored continuously by radiology nursing throughout the procedure under my direct supervision. CONTRAST:  None COMPLICATIONS: None immediate. PROCEDURE: RADIATION DOSE REDUCTION: This exam was performed according to the departmental dose-optimization program which includes automated exposure control, adjustment of the mA and/or kV according to patient size and/or use of iterative reconstruction technique. Informed written consent was obtained from the patient after  a discussion of the risks, benefits and alternatives to treatment. The patient was placed supine, partially left lateral decubitus on the CT gantry and a pre procedural CT was performed re-demonstrating the known fluid collection within the right pleural space. The procedure was planned. A timeout was performed prior to the initiation of the procedure. The right posterolateral and inferior thorax was prepped and draped in  the usual sterile fashion. The overlying soft tissues were anesthetized with 1% lidocaine with epinephrine. Appropriate trajectory was planned with the use of a 22 gauge spinal needle. An 18 gauge trocar needle was advanced into the pleural space and a short Amplatz super stiff wire was coiled within the collection. Appropriate positioning was confirmed with a limited CT scan. The tract was serially dilated allowing placement of a 14 French all-purpose drainage catheter. Appropriate positioning was confirmed with a limited postprocedural CT scan. Air was immediately aspirated. The tube was connected to a pleurovac and sutured in place. A dressing was placed. The patient tolerated the procedure well without immediate post procedural complication. IMPRESSION: Successful CT guided placement of a 20 French all purpose drain catheter into the anterior superior right pleural space. Marliss Coots, MD [facility] Radiology Electronically Signed   By: Marliss Coots M.D.   On: 11/04/2021 17:10    Labs:  CBC: Recent Labs    11/01/21 0538 11/01/21 1814 11/02/21 0152 11/06/21 0050  WBC 4.3 11.5* 11.4* 5.4  HGB 9.2* 9.7* 10.0* 8.4*  HCT 28.4* 31.7* 30.8* 25.0*  PLT 177 144* 141* 102*    COAGS: Recent Labs    10/22/21 1451 10/24/21 0750 11/01/21 0538 11/02/21 0152  INR 1.6* 1.4* 1.1 1.5*    BMP: Recent Labs    11/02/21 0152 11/03/21 0101 11/05/21 0840 11/06/21 0050  NA 134* 133* 130* 130*  K 4.7 4.8 4.3 4.5  CL 100 100 97* 97*  CO2 26 27 27 27   GLUCOSE 130* 122* 131* 109*  BUN 23* 24* 13 9  CALCIUM 9.4 8.9 8.4* 8.5*  CREATININE 0.77 0.81 0.71 0.58  GFRNONAA >60 >60 >60 >60    LIVER FUNCTION TESTS: Recent Labs    10/29/21 0356 10/30/21 0039 11/01/21 0538 11/02/21 0152  BILITOT 0.7 0.7 1.2 0.9  AST 29 32 37 59*  ALT 20 18 22  34  ALKPHOS 88 105 88 87  PROT 5.2* 5.4* 6.0* 6.0*  ALBUMIN 2.3* 2.3* 2.6* 3.0*    Assessment and  Plan: Cirrhosis with portal hypertension and recurrent ascites.  S/p TIPS creation 11/01/21 by Dr. Archer Asa. CT guided right thoracostomy tube placement 11/04/21 with Dr. Elby Showers.   F/u CXR this morning: IMPRESSION: Small apical/lateral basal right pneumothorax is unchanged. Chest tube positioning is unchanged.  Chest tube back to suction after water seal showed enlarged pneumothorax yesterday.    IR will continue to follow.   Electronically Signed: Shon Hough, NP 11/06/2021, 3:03 PM   I spent a total of 15 Minutes at the the patient's bedside AND on the patient's hospital floor or unit, greater than 50% of which was counseling/coordinating care for TIPS f/u right chest tube.

## 2021-11-06 NOTE — Progress Notes (Signed)
? ?PROGRESS NOTE ? ? ? ?Katie Holland  GMW:[id number] DOB: August 05, 1965 DOA: 10/22/2021 ?PCP: Billie Ruddy, MD ? ? ?Brief Narrative: ?The patient is a 56 year old African-American female with past medical history significant for but not limited to alcoholic liver failure, history of alcohol abuse, history of tobacco abuse, history of esophageal varices, history of upper GI bleed as well as other comorbidities who presented with worsening shortness of breath and increased O2 requirement.  She underwent an ultrasound-guided therapeutic paracentesis that yielded about 9.8 L of clear yellow fluid and had tolerated well and was administered 75 g of albumin.  Subsequently she is noted to be short of breath and had an elevated heart rate and high blood pressure even after paracentesis.  She is noted to be saturating 89% and increased work of bleeding and to be placed on 4 L of supplemental oxygen which improved her O2 saturations 96%.  There is attempt to wean her from her 4 L but because she failed to do so she was sent the ED for further evaluation.  She is noted to be somewhat more lethargic than usual and she was on home Lasix and spironolactone.  She also had some abdominal pain and does not drink anymore and her last drink was 3 months ago.  She continues to smoke cigarettes 6 to 7 cigarettes a day and think about quitting.  She had no bleeding and her last thoracentesis was 9 months ago she stated.  While in the ED she is noted to have a lactic acid of 2.3 and a chest x-ray that was worrisome for her right middle right lower lobe large right pleural effusion but also could be pneumonia versus CHF.  GI was notified of patient's admission and blood cultures and sputum cultures were obtained.  She underwent a CT of the chest which showed a right large sided pleural effusion with right middle lobe and right lower lobe consolidation as well as residual ascites in the abdomen following paracentesis.  She had cirrhotic  changes of the liver that were seen.  She is admitted for severe sepsis given that her lactic acid was level greater than 2 and that she had new oxygen requirement of 4 L.  She was placed on antibiotics azithromycin and ceftriaxone.  She is found to have 1 out of 4 bottles of E. coli bacteremia with possible pneumonia and she is continued on ceftriaxone and azithromycin.  IR was also consulted and she underwent a 1.8 L thoracentesis and there was question removed with there this was a hepatic hydrothorax.  She is continue on ceftriaxone and azithromycin and she did have large ascites that is refractory to high-dose diuretics.  GI evaluated and recommended resuming her home diuretic doses of Lasix 40 mg twice daily spironolactone 20 mg daily follow-up for a low sodium diet.  Patient was stressed to get a TIPS however this was canceled in the setting of her bacteremia ? ?GI has initiated her back on her home diuretics but if her renal function is okay there we will try increasing as she tolerates tomorrow.  They are recommending a discharge that she stay on Lasix 40 mg twice daily and spironolactone 200 g daily but they may increase it while she is in house.  Repeat chest x-ray yesterday showed a moderate pleural effusion again and today CXR showed "Large right pleural effusion, increased in size since the study from 1 day prior with worsened aeration of the right lung." ? ?Repeat CXR showed worsening  large right pleural effusion and because of her shortness of breath while ambulation a repeat thoracentesis was ordered.  GI recommending continuing diuretics and may increase the dose and recommending continuing low-sodium diet. ? ?She is s/p Thoracentesis again and 1 Liter was removed on 10/25/21.  GI has increased her diuretics to 40 mg of Lasix 3 times daily and 300 mg spironolactone daily however yesterday when I  walked in the room she was eating a large bag of L[employer] potato chips so I advised her about her salt  intake. ? ?10/27/21 she was actively vomiting and retching and did not feel as good. She is improved 10/28/21 but back on Supplemental O2 and complains of slight SOB.  GI will reach out to interventional radiology about timing of the TIPS and will continue antibiotics for 7 days for her E. coli bacteremia.  GI recommending continuing current dose of diuretics and not escalating dose yet ? ?10/28/21 Had worsening of he Pleural Effusion and had some dyspnea so Pulmonary was consulted who feels that she has a hepatic hydrothorax and recommending definitive management with TIPS but recommending another thoracentesis for comfort if she becomes more dyspenic.  GI spoken with IR and they will write a note outlining the plans regarding future TIPS.  Her antibiotics were switched from ceftriaxone to Ancef for 2 more day to complete 7 days of gram-negative coverage. ? ?TIPS performed successfully on 5/12. Post repeat thoracentesis, patient developed worsening right-sided pneumothorax. IR placed right chest tube on 5/15. ? ?Assessment and Plan: ? ?Severe sepsis ?Secondary to E. Coli bacteremia and pneumonia. Resolved with antibiotics. ? ?E. Coli bacteremia ?Patient treated with Ceftriaxone IV and transitioned to Cefazolin. Completed a 7 day course of antibiotics. Likely translocation from GI source. Resolved. ? ?Community acquired pneumonia ?Patient treated with Ceftriaxone and azithromycin. Completed course. ? ?Acute respiratory failure with hypoxia ?Secondary to pneumonia and pleural effusion from hepatic hydrothorax. Patient is s/p multiple thoracenteses. Weaned to room air. Resolved. ? ?Decompensated alcoholic cirrhosis ?Ascites ?Esophageal varices ?GI on board. Patient is currently on high dose diuretic therapy. GI recommending TIPS. IR consulted for TIPS which was successfully completed on 5/12. ?-Continue Lasix and Spironolactone ?-Continue lactulose ?-IR follow-up as an outpatient ? ?Right-sided  pneumothorax ?Post-thoracentesis. Patient is on room air.  Remains with chest tube, inserted on 5/15.  IR, remains with 5% apical pneumothorax today, chest tube remains on suction. ?-Management per IR ? ?Hyperammoniemia ?No note of hepatic encephalopathy, but patient developed asterixis. Lactulose therapy initiated. ? ?Elevated troponin ?Troponin peak of 234. Assessment is demand ischemia. ? ?Non thyroidal illness ?Likely diagnosis in critically ill patient. Low TSH and T3 with normal free T4. Recommend repeat thyroid testing as an outpatient when no longer acutely ill. ? ?Microcytic anemia ?B12 Deficiency ?Chronic and stable hemoglobin. ?-Continue B12 supplementation ? ?Vitamin D deficiency ?Vitamin B6 deficiency ?Zinc deficiency ?-Continue vitamin D3, vitamin B6 and zine supplementation ? ?Nausea/vomiting ?Resolved. ? ?Thrombocytopenia ?Mild. In setting of liver disease. ? ?Lactic acidosis ?In setting of liver failure and sepsis.  ? ?Severe malnutrition ?In setting of chronic illness. Dietitian consulted.  ?Dietitian recommendations (5/10): ?Continue Multivitamin w/ minerals daily ?Continue Boost Plus po BID, provides 360 kcal and 14 grams of protein each. ?Encourage good PO intake  ? ?DVT prophylaxis:  Heparin  ?Code Status:   Code Status: DNR ?Family Communication: None at bedside ?Disposition Plan: Discharge home with home health likely in 1-3 days pending IR management of pneumothorax. Home with home health. ? ? ?Consultants:  ?  PCCM ?[facility] ?Interventional radiology ? ?Procedures:  ?Paracentesis (10/22/2021) ?Right thoracentesis (10/23/2021) ?Right thoracentesis (10/25/2021) ? ?Antimicrobials: ?Ceftriaxone IV ?Azithromycin IV ?Cefazolin IV  ? ? ?Subjective: ?No issues this morning. ? ?Objective: ?BP (!) 90/57 (BP Location: Left Arm)   Pulse 84   Temp 98.4 ?F (36.9 ?C) (Oral)   Resp 17   Ht '5\' 4"'$  (1.626 m)   Wt 57.5 kg   SpO2 100%   BMI 21.75 kg/m?  ? ?Examination: ? ?Awake Alert, Oriented  X 3, No new F.N deficits, Normal affect, frail, deconditioned ?Symmetrical Chest wall movement, respiratory sounds in right lung ?RRR,No Gallops,Rubs or new Murmurs, No Parasternal Heave ?+ve B.Sounds, Abd Soft, No tenderness, No rebound - guarding

## 2021-11-06 NOTE — Progress Notes (Signed)
Spoke with MD Suttle due to concern of chest tube dressing being loose at bottom corner. Stated to change dressing and place gauze and tegaderm dressing.  ?

## 2021-11-07 ENCOUNTER — Inpatient Hospital Stay (HOSPITAL_COMMUNITY): Payer: Medicare HMO

## 2021-11-07 DIAGNOSIS — J9601 Acute respiratory failure with hypoxia: Secondary | ICD-10-CM | POA: Diagnosis not present

## 2021-11-07 DIAGNOSIS — K7031 Alcoholic cirrhosis of liver with ascites: Secondary | ICD-10-CM | POA: Diagnosis not present

## 2021-11-07 DIAGNOSIS — R6520 Severe sepsis without septic shock: Secondary | ICD-10-CM | POA: Diagnosis not present

## 2021-11-07 DIAGNOSIS — A419 Sepsis, unspecified organism: Secondary | ICD-10-CM | POA: Diagnosis not present

## 2021-11-07 NOTE — Progress Notes (Signed)
Referring Physician(s): Dr. Havery Moros  Supervising Physician: Mir, Sharen Heck  Patient Status:  [facility] - In-pt  Chief Complaint: Cirrhosis with portal hypertension and recurrent ascites. S/p TIPS creation 11/01/21 by Dr. Laurence Ferrari. CT guided right thoracostomy tube placement 11/04/21 with Dr. Serafina Royals.   Subjective: Resting comfortably.  Agreeable to chest tube removal. Denies shortness of breath.   Allergies: Contrast media [iodinated contrast media], Latex, and Sulfa antibiotics  Medications: Prior to Admission medications   Medication Sig Start Date End Date Taking? Authorizing Provider  furosemide (LASIX) 20 MG tablet Take 2 tablets (40 mg total) by mouth 2 (two) times daily. 09/11/21 12/10/21 Yes Armbruster, Carlota Raspberry, MD  Multiple Vitamin (MULTIVITAMIN WITH MINERALS) TABS tablet Take 1 tablet by mouth daily.   Yes [provider]  prednisoLONE acetate (PRED FORTE) 1 % ophthalmic suspension Place 1 drop into both eyes 2 (two) times daily.   Yes [provider]  spironolactone (ALDACTONE) 100 MG tablet Take 2 tablets (200 mg total) by mouth once daily 09/11/21  Yes Armbruster, Carlota Raspberry, MD     Vital Signs: BP (!) 92/58 (BP Location: Left Arm)   Pulse 94   Temp (!) 97.5 F (36.4 C) (Oral)   Resp 14   Ht '5\' 4"'$  (1.626 m)   Wt 126 lb 11.2 oz (57.5 kg)   SpO2 100%   BMI 21.75 kg/m   Physical Exam Vitals reviewed.  Constitutional:      General: She is not in acute distress.    Appearance: She is not ill-appearing.     Comments: thin  HENT:     Head: Normocephalic and atraumatic.  Eyes:     Extraocular Movements: Extraocular movements intact.     Pupils: Pupils are equal, round, and reactive to light.  Pulmonary:     Effort: Pulmonary effort is normal. No respiratory distress.     Comments: R chest tube intact.  Dressing with some drainage on gauze. Serous fluid in PleurVac. Water sealed, no air leak.  Skin:    General: Skin is warm and dry.   Neurological:     Mental Status: She is alert and oriented to person, place, and time.  Psychiatric:        Mood and Affect: Mood normal.        Behavior: Behavior normal.        Thought Content: Thought content normal.        Judgment: Judgment normal.    Imaging: DG CHEST PORT 1 VIEW  Result Date: 11/07/2021 CLINICAL DATA:  Follow-up pneumothorax. EXAM: PORTABLE CHEST 1 VIEW COMPARISON:  Multiple previous chest x-rays. FINDINGS: The right-sided chest tube is stable. There is a very small residual right apical pneumothorax. Streaky right basilar atelectasis. The left lung remains clear. IMPRESSION: Very small residual right apical pneumothorax. Electronically Signed   By: Marijo Sanes M.D.   On: 11/07/2021 14:03   DG Chest Port 1 View  Result Date: 11/07/2021 CLINICAL DATA:  Chest tube placement.  Shortness of breath. EXAM: PORTABLE CHEST 1 VIEW COMPARISON:  Chest radiograph 11/06/2021 FINDINGS: The cardiomediastinal silhouette is unchanged with normal heart size. A right-sided chest tube remains in place. There is a persistent trace right apical pneumothorax. Mild right basilar opacity has slightly increased and likely reflects atelectasis. No sizable pleural effusion is evident. IMPRESSION: Persistent trace right apical pneumothorax with chest tube in place. Mild right basilar atelectasis. Electronically Signed   By: Logan Bores M.D.   On: 11/07/2021 08:31   DG  Chest Port 1 View  Result Date: 11/06/2021 CLINICAL DATA:  Pneumothorax with right chest tube in place. EXAM: PORTABLE CHEST 1 VIEW COMPARISON:  Portable chest yesterday at 1:21 p.m. FINDINGS: 5:11 a.m., 11/06/2021. Pigtail right chest pleural tube remains in place with pigtail in the lateral aspect of the mid chest. Small right pneumothorax with apical and lateral basal components appears improved, estimated 5% or less volume. The lungs are clear. No pleural effusion is seen. The cardiac size and mediastinal silhouette are normal.  No acute osseous abnormalities. IMPRESSION: Small apical/lateral basal right pneumothorax is unchanged. Chest tube positioning is unchanged. No other evidence of acute chest process. Electronically Signed   By: Telford Nab M.D.   On: 11/06/2021 06:40   DG Chest Port 1 View  Result Date: 11/05/2021 CLINICAL DATA:  Follow-up right pneumothorax. EXAM: PORTABLE CHEST 1 VIEW COMPARISON:  Prior today FINDINGS: Right pleural pigtail catheter remains in place. A small right pneumothorax with both apical and basilar components appears mildly increased in size since prior study today. Both lungs are clear. Heart size is normal. IMPRESSION: Mild increase in size of small right pneumothorax. Electronically Signed   By: Marlaine Hind M.D.   On: 11/05/2021 13:42   DG Chest Port 1 View  Result Date: 11/05/2021 CLINICAL DATA:  Chest tube placement for right pneumothorax EXAM: PORTABLE CHEST 1 VIEW COMPARISON:  Chest radiograph from one day prior. FINDINGS: Right chest tube terminates over the peripheral right mid pleural space. Stable cardiomediastinal silhouette with normal heart size. Tiny residual apical right pneumothorax, less than 5%, substantially decreased. No left pneumothorax. No pleural effusion. Improved right lung aeration with residual mild right basilar atelectasis. Otherwise clear lungs, with no pulmonary edema. IMPRESSION: 1. Tiny residual apical right pneumothorax, less than 5%, substantially decreased. Right chest tube in place. 2. Improved right lung aeration with residual mild right basilar atelectasis. Electronically Signed   By: Ilona Sorrel M.D.   On: 11/05/2021 10:35   DG Chest Port 1 View  Result Date: 11/04/2021 CLINICAL DATA:  Shortness of breath.  Pneumothorax. EXAM: PORTABLE CHEST 1 VIEW COMPARISON:  Chest x-ray Nov 03, 2021. FINDINGS: Continued interval increase in size of the right hydropneumothorax, now measuring up to approximately 2.8 cm (previously 1.8 cm). No appreciable  mediastinal shift. Right basilar opacities, probably atelectasis. Left lung is clear. Cardiomediastinal silhouette is similar to prior and within normal limits. IMPRESSION: 1. Continued interval increase in size of the right hydropneumothorax, now measuring up to approximately 2.8 cm (previously 1.8 cm). 2. Right basilar opacities, probably atelectasis. These results will be called to the ordering clinician or representative by the Radiologist Assistant, and communication documented in the PACS or [employer]. Electronically Signed   By: Margaretha Sheffield M.D.   On: 11/04/2021 07:39   CT IMAGE GUIDED DRAINAGE BY PERCUTANEOUS CATHETER  Result Date: 11/04/2021 INDICATION: 56 year old female status post recent tips creation complicated postprocedure by hydropneumothorax which is enlarging. The patient presents for CT-guided thoracostomy tube. EXAM: CT IMAGE GUIDED DRAINAGE BY PERCUTANEOUS CATHETER COMPARISON:  None Available. MEDICATIONS: The patient is currently admitted to the hospital and receiving intravenous antibiotics. The antibiotics were administered within an appropriate time frame prior to the initiation of the procedure. ANESTHESIA/SEDATION: Moderate (conscious) sedation was employed during this procedure. A total of Versed 1.5 mg and Fentanyl 50 mcg was administered intravenously. Moderate Sedation Time: 15 minutes. The patient's level of consciousness and vital signs were monitored continuously by radiology nursing throughout the procedure under my direct supervision.  CONTRAST:  None COMPLICATIONS: None immediate. PROCEDURE: RADIATION DOSE REDUCTION: This exam was performed according to the departmental dose-optimization program which includes automated exposure control, adjustment of the mA and/or kV according to patient size and/or use of iterative reconstruction technique. Informed written consent was obtained from the patient after a discussion of the risks, benefits and alternatives to  treatment. The patient was placed supine, partially left lateral decubitus on the CT gantry and a pre procedural CT was performed re-demonstrating the known fluid collection within the right pleural space. The procedure was planned. A timeout was performed prior to the initiation of the procedure. The right posterolateral and inferior thorax was prepped and draped in the usual sterile fashion. The overlying soft tissues were anesthetized with 1% lidocaine with epinephrine. Appropriate trajectory was planned with the use of a 22 gauge spinal needle. An 18 gauge trocar needle was advanced into the pleural space and a short Amplatz super stiff wire was coiled within the collection. Appropriate positioning was confirmed with a limited CT scan. The tract was serially dilated allowing placement of a 14 French all-purpose drainage catheter. Appropriate positioning was confirmed with a limited postprocedural CT scan. Air was immediately aspirated. The tube was connected to a pleurovac and sutured in place. A dressing was placed. The patient tolerated the procedure well without immediate post procedural complication. IMPRESSION: Successful CT guided placement of a 74 French all purpose drain catheter into the anterior superior right pleural space. Ruthann Cancer, MD [facility] Electronically Signed   By: Ruthann Cancer M.D.   On: 11/04/2021 17:10    Labs:  CBC: Recent Labs    11/01/21 0538 11/01/21 1814 11/02/21 0152 11/06/21 0050  WBC 4.3 11.5* 11.4* 5.4  HGB 9.2* 9.7* 10.0* 8.4*  HCT 28.4* 31.7* 30.8* 25.0*  PLT 177 144* 141* 102*     COAGS: Recent Labs    10/22/21 1451 10/24/21 0750 11/01/21 0538 11/02/21 0152  INR 1.6* 1.4* 1.1 1.5*     BMP: Recent Labs    11/02/21 0152 11/03/21 0101 11/05/21 0840 11/06/21 0050  NA 134* 133* 130* 130*  K 4.7 4.8 4.3 4.5  CL 100 100 97* 97*  CO2 '26 27 27 27  '$ GLUCOSE 130* 122* 131* 109*  BUN  23* 24* 13 9  CALCIUM 9.4 8.9 8.4* 8.5*  CREATININE 0.77 0.81 0.71 0.58  GFRNONAA >60 >60 >60 >60     LIVER FUNCTION TESTS: Recent Labs    10/29/21 0356 10/30/21 0039 11/01/21 0538 11/02/21 0152  BILITOT 0.7 0.7 1.2 0.9  AST 29 32 37 59*  ALT '20 18 22 '$ 34  ALKPHOS 88 105 88 87  PROT 5.2* 5.4* 6.0* 6.0*  ALBUMIN 2.3* 2.3* 2.6* 3.0*     Assessment and Plan: Cirrhosis with portal hypertension and recurrent ascites S/p TIPS creation 11/01/21 by Dr. Laurence Ferrari. CT guided right thoracostomy tube placement 11/04/21 with Dr. Serafina Royals.  Patient with right ex vacuo pneumothorax after right-sided thoracentesis with >3L fluid removed during TIPS requiring chest tube placement.  Chest tube to seal today for several hours and chest xray this afternoon shows near complete resolve. Per Dr. Laurence Ferrari ok to remove tube this afternoon.    PA to bedside.  Chest tube removed.  CXR ordered stat.  No complication or change in status with tube removal.  Patient denies discomfort or new shortness of breath.  Dressing placed.   Electronically Signed: Docia Barrier, PA 11/07/2021, 4:59 PM   I spent  a total of 15 Minutes at the the patient's bedside AND on the patient's hospital floor or unit, greater than 50% of which was counseling/coordinating care for right pneumothorax.

## 2021-11-07 NOTE — Progress Notes (Signed)
Physical Therapy Treatment Patient Details Name: Katie Holland MRN: [id number] DOB: 1966/06/13 Today's Date: 11/07/2021   History of Present Illness 56 y/o female presented to ED on 10/22/21 for SOB and chest tightness after she received paracentesis earlier that day. Admitted for sepsis 2/2 E. coli bacteremia and CAP. Underwent TIPS 11/01/21.PMH: ETOH cirrhosis    PT Comments    Working on increasing ambulation in hallway. Unfortunately pt's chest tube tubing was leaking at a connector site near collection canister so had to return to room. Primarily needs help moving walker around obstacles. Gait is fairly stable. Hopefully once chest tube is out pain will improve and pt's mobility will increase.    Recommendations for follow up therapy are one component of a multi-disciplinary discharge planning process, led by the attending physician.  Recommendations may be updated based on patient status, additional functional criteria and insurance authorization.  Follow Up Recommendations  Home health PT     Assistance Recommended at Discharge Intermittent Supervision/Assistance  Patient can return home with the following A little help with walking and/or transfers;A little help with bathing/dressing/bathroom;Assistance with cooking/housework;Assist for transportation;Help with stairs or ramp for entrance   Equipment Recommendations  Rolling walker (2 wheels)    Recommendations for Other Services       Precautions / Restrictions Precautions Precautions: Fall Precaution Comments: R chest tube Restrictions Weight Bearing Restrictions: No     Mobility  Bed Mobility Overal bed mobility: Needs Assistance Bed Mobility: Supine to Sit, Sit to Supine     Supine to sit: Min guard Sit to supine: Min guard   General bed mobility comments: Incr time and effort. Min guard for safety.    Transfers Overall transfer level: Needs assistance Equipment used: Rolling walker (2 wheels) Transfers: Sit  to/from Stand Sit to Stand: Min guard           General transfer comment: Min guard for safety, slow to move, guarded due to R flank pain at chest tube site    Ambulation/Gait Ambulation/Gait assistance: Min assist Gait Distance (Feet): 30 Feet Assistive device: Rolling walker (2 wheels) Gait Pattern/deviations: Step-through pattern, Decreased stride length, Trunk flexed Gait velocity: decreased Gait velocity interpretation: <1.31 ft/sec, indicative of household ambulator   General Gait Details: Assist at times to manuever walker around obstacles. Distance limited due to chest tube leaking from connector near collection canister.   Stairs             Wheelchair Mobility    Modified Rankin (Stroke Patients Only)       Balance Overall balance assessment: Needs assistance Sitting-balance support: No upper extremity supported, Feet supported Sitting balance-Leahy Scale: Good     Standing balance support: During functional activity, Bilateral upper extremity supported Standing balance-Leahy Scale: Fair                              Cognition Arousal/Alertness: Awake/alert Behavior During Therapy: WFL for tasks assessed/performed Overall Cognitive Status: Within Functional Limits for tasks assessed                                          Exercises      General Comments General comments (skin integrity, edema, etc.): VSS on RA      Pertinent Vitals/Pain Pain Assessment Pain Assessment: Faces Faces Pain Scale: Hurts even more Pain Location: chest tube  site Pain Descriptors / Indicators: Grimacing, Guarding Pain Intervention(s): Limited activity within patient's tolerance, Monitored during session, Premedicated before session    Home Living                          Prior Function            PT Goals (current goals can now be found in the care plan section) Acute Rehab PT Goals Patient Stated Goal: to  improve Progress towards PT goals: Not progressing toward goals - comment (leaking chest tube tubing)    Frequency    Min 3X/week      PT Plan Current plan remains appropriate    Co-evaluation              AM-PAC PT "6 Clicks" Mobility   Outcome Measure  Help needed turning from your back to your side while in a flat bed without using bedrails?: None Help needed moving from lying on your back to sitting on the side of a flat bed without using bedrails?: None Help needed moving to and from a bed to a chair (including a wheelchair)?: A Little Help needed standing up from a chair using your arms (e.g., wheelchair or bedside chair)?: A Little Help needed to walk in hospital room?: A Little Help needed climbing 3-5 steps with a railing? : A Little 6 Click Score: 20    End of Session   Activity Tolerance: Other (comment) (Treatment limited by leaking chest tube) Patient left: with call bell/phone within reach;in bed;with nursing/sitter in room;with bed alarm set Nurse Communication: Mobility status;Other (comment) (leak from chest tube tubing) PT Visit Diagnosis: Muscle weakness (generalized) (M62.81);Unsteadiness on feet (R26.81);Pain;Other abnormalities of gait and mobility (R26.89);Difficulty in walking, not elsewhere classified (R26.2)     Time: 1660-6301 PT Time Calculation (min) (ACUTE ONLY): 30 min  Charges:  $Gait Training: 23-37 mins                     [facility] 11/07/2021, 12:41 PM

## 2021-11-07 NOTE — Progress Notes (Signed)
PROGRESS NOTE    Katie Holland  NID:[id number] DOB: 04-Feb-1966 DOA: 10/22/2021 PCP: Billie Ruddy, MD   Brief Narrative: The patient is a 56 year old African-American female with past medical history significant for but not limited to alcoholic liver failure, history of alcohol abuse, history of tobacco abuse, history of esophageal varices, history of upper GI bleed as well as other comorbidities who presented with worsening shortness of breath and increased O2 requirement.  She underwent an ultrasound-guided therapeutic paracentesis that yielded about 9.8 L of clear yellow fluid and had tolerated well and was administered 75 g of albumin.  Subsequently she is noted to be short of breath and had an elevated heart rate and high blood pressure even after paracentesis.  She is noted to be saturating 89% and increased work of bleeding and to be placed on 4 L of supplemental oxygen which improved her O2 saturations 96%.  There is attempt to wean her from her 4 L but because she failed to do so she was sent the ED for further evaluation.  She is noted to be somewhat more lethargic than usual and she was on home Lasix and spironolactone.  She also had some abdominal pain and does not drink anymore and her last drink was 3 months ago.  She continues to smoke cigarettes 6 to 7 cigarettes a day and think about quitting.  She had no bleeding and her last thoracentesis was 9 months ago she stated.  While in the ED she is noted to have a lactic acid of 2.3 and a chest x-ray that was worrisome for her right middle right lower lobe large right pleural effusion but also could be pneumonia versus CHF.  GI was notified of patient's admission and blood cultures and sputum cultures were obtained.  She underwent a CT of the chest which showed a right large sided pleural effusion with right middle lobe and right lower lobe consolidation as well as residual ascites in the abdomen following paracentesis.  She had cirrhotic  changes of the liver that were seen.  She is admitted for severe sepsis given that her lactic acid was level greater than 2 and that she had new oxygen requirement of 4 L.  She was placed on antibiotics azithromycin and ceftriaxone.  She is found to have 1 out of 4 bottles of E. coli bacteremia with possible pneumonia and she is continued on ceftriaxone and azithromycin.  IR was also consulted and she underwent a 1.8 L thoracentesis and there was question removed with there this was a hepatic hydrothorax.  She is continue on ceftriaxone and azithromycin and she did have large ascites that is refractory to high-dose diuretics.  GI evaluated and recommended resuming her home diuretic doses of Lasix 40 mg twice daily spironolactone 20 mg daily follow-up for a low sodium diet.  Patient was stressed to get a TIPS however this was canceled in the setting of her bacteremia  GI has initiated her back on her home diuretics but if her renal function is okay there we will try increasing as she tolerates tomorrow.  They are recommending a discharge that she stay on Lasix 40 mg twice daily and spironolactone 200 g daily but they may increase it while she is in house.  Repeat chest x-ray yesterday showed a moderate pleural effusion again and today CXR showed "Large right pleural effusion, increased in size since the study from 1 day prior with worsened aeration of the right lung."  Repeat CXR showed worsening  large right pleural effusion and because of her shortness of breath while ambulation a repeat thoracentesis was ordered.  GI recommending continuing diuretics and may increase the dose and recommending continuing low-sodium diet.  She is s/p Thoracentesis again and 1 Liter was removed on 10/25/21.  GI has increased her diuretics to 40 mg of Lasix 3 times daily and 300 mg spironolactone daily however yesterday when I  walked in the room she was eating a large bag of Lays potato chips so I advised her about her salt  intake.  10/27/21 she was actively vomiting and retching and did not feel as good. She is improved 10/28/21 but back on Supplemental O2 and complains of slight SOB.  GI will reach out to interventional radiology about timing of the TIPS and will continue antibiotics for 7 days for her E. coli bacteremia.  GI recommending continuing current dose of diuretics and not escalating dose yet  10/28/21 Had worsening of he Pleural Effusion and had some dyspnea so Pulmonary was consulted who feels that she has a hepatic hydrothorax and recommending definitive management with TIPS but recommending another thoracentesis for comfort if she becomes more dyspenic.  GI spoken with IR and they will write a note outlining the plans regarding future TIPS.  Her antibiotics were switched from ceftriaxone to Ancef for 2 more day to complete 7 days of gram-negative coverage.  TIPS performed successfully on 5/12. Post repeat thoracentesis, patient developed worsening right-sided pneumothorax. IR placed right chest tube on 5/15.  Assessment and Plan:  Severe sepsis Secondary to E. Coli bacteremia and pneumonia. Resolved with antibiotics.  E. Coli bacteremia Patient treated with Ceftriaxone IV and transitioned to Cefazolin. Completed a 7 day course of antibiotics. Likely translocation from GI source. Resolved.  Community acquired pneumonia Patient treated with Ceftriaxone and azithromycin. Completed course.  Acute respiratory failure with hypoxia Secondary to pneumonia and pleural effusion from hepatic hydrothorax. Patient is s/p multiple thoracenteses. Weaned to room air. Resolved.  Decompensated alcoholic cirrhosis Ascites Esophageal varices GI on board. Patient is currently on high dose diuretic therapy. GI recommending TIPS. IR consulted for TIPS which was successfully completed on 5/12. -Continue Lasix and Spironolactone -Continue lactulose -IR follow-up as an outpatient  Right-sided  pneumothorax Post-thoracentesis. Patient is on room air.  Remains with chest tube, inserted on 5/15.  IR. -Management per IR, remains on suction, improved pneumothorax on imaging today.  .  Hyperammoniemia No note of hepatic encephalopathy, but patient developed asterixis. Lactulose therapy initiated.  Elevated troponin Troponin peak of 234. Assessment is demand ischemia.  Non thyroidal illness Likely diagnosis in critically ill patient. Low TSH and T3 with normal free T4. Recommend repeat thyroid testing as an outpatient when no longer acutely ill.  Microcytic anemia B12 Deficiency Chronic and stable hemoglobin. -Continue B12 supplementation  Vitamin D deficiency Vitamin B6 deficiency Zinc deficiency -Continue vitamin D3, vitamin B6 and zine supplementation  Nausea/vomiting Resolved.  Thrombocytopenia Mild. In setting of liver disease.  Lactic acidosis In setting of liver failure and sepsis.   Severe malnutrition In setting of chronic illness. Dietitian consulted.  Dietitian recommendations (5/10): Continue Multivitamin w/ minerals daily Continue Boost Plus po BID, provides 360 kcal and 14 grams of protein each. Encourage good PO intake   DVT prophylaxis: [place] Heparin  Code Status:   Code Status: DNR Family Communication: None at bedside Disposition Plan: Discharge home with home health likely in 1-3 days pending IR management of pneumothorax. Home with home health.   Consultants:  PCCM  [facility] Interventional radiology  Procedures:  Paracentesis (10/22/2021) Right thoracentesis (10/23/2021) Right thoracentesis (10/25/2021)  Antimicrobials: Ceftriaxone IV Azithromycin IV Cefazolin IV    Subjective: No significant events overnight, she denies any complaints.  Objective: BP 135/60 (BP Location: Left Arm)   Pulse 87   Temp 98.1 F (36.7 C) (Oral)   Resp 19   Ht '5\' 4"'$  (1.626 m)   Wt 57.5 kg   SpO2 100%   BMI 21.75 kg/m    Examination:  Awake Alert, Oriented X 3, No new F.N deficits, Normal affect, frail, deconditioned Symmetrical Chest wall movement, Good air movement bilaterally, CTAB RRR,No Gallops,Rubs or new Murmurs, No Parasternal Heave +ve B.Sounds, Abd Soft, No tenderness, No rebound - guarding or rigidity. No Cyanosis, Clubbing or edema, No new Rash or bruise       Data Reviewed: I have personally reviewed following labs and imaging studies  CBC Lab Results  Component Value Date   WBC 5.4 11/06/2021   RBC 3.46 (L) 11/06/2021   HGB 8.4 (L) 11/06/2021   HCT 25.0 (L) 11/06/2021   MCV 72.3 (L) 11/06/2021   MCH 24.3 (L) 11/06/2021   PLT 102 (L) 11/06/2021   MCHC 33.6 11/06/2021   RDW 22.6 (H) 11/06/2021   LYMPHSABS 0.6 (L) 11/01/2021   MONOABS 0.3 11/01/2021   EOSABS 0.0 11/01/2021   BASOSABS 0.0 54/00/8676     Last metabolic panel Lab Results  Component Value Date   NA 130 (L) 11/06/2021   K 4.5 11/06/2021   CL 97 (L) 11/06/2021   CO2 27 11/06/2021   BUN 9 11/06/2021   CREATININE 0.58 11/06/2021   GLUCOSE 109 (H) 11/06/2021   GFRNONAA >60 11/06/2021   GFRAA >60 12/07/2019   CALCIUM 8.5 (L) 11/06/2021   PHOS 3.5 10/30/2021   PROT 6.0 (L) 11/02/2021   ALBUMIN 3.0 (L) 11/02/2021   BILITOT 0.9 11/02/2021   ALKPHOS 87 11/02/2021   AST 59 (H) 11/02/2021   ALT 34 11/02/2021   ANIONGAP 6 11/06/2021    GFR: Estimated Creatinine Clearance: 67.8 mL/min (by C-G formula based on SCr of 0.58 mg/dL).  Recent Results (from the past 240 hour(s))  Culture, blood (Routine X 2) w Reflex to ID Panel     Status: None   Collection Time: 10/29/21  7:20 PM   Specimen: BLOOD LEFT HAND  Result Value Ref Range Status   Specimen Description BLOOD LEFT HAND  Final   Special Requests   Final    BOTTLES DRAWN AEROBIC AND ANAEROBIC Blood Culture adequate volume   Culture   Final    NO GROWTH 5 DAYS Performed at [facility], [address], [place], [place] [address]    Report Status  11/03/2021 FINAL  Final  Culture, blood (Routine X 2) w Reflex to ID Panel     Status: None   Collection Time: 10/29/21  7:32 PM   Specimen: BLOOD RIGHT ARM  Result Value Ref Range Status   Specimen Description BLOOD RIGHT ARM  Final   Special Requests   Final    BOTTLES DRAWN AEROBIC AND ANAEROBIC Blood Culture adequate volume   Culture   Final    NO GROWTH 5 DAYS Performed at [facility], [place] [address], [place],  [address]    Report Status 11/03/2021 FINAL  Final      Radiology Studies: DG CHEST PORT 1 VIEW  Result Date: 11/07/2021 CLINICAL DATA:  Follow-up pneumothorax. EXAM: PORTABLE CHEST 1 VIEW COMPARISON:  Multiple previous chest x-rays. FINDINGS: The right-sided chest tube is stable. There is a very small residual right apical pneumothorax. Streaky right basilar atelectasis. The left lung remains clear. IMPRESSION: Very small residual right apical pneumothorax. Electronically Signed   By: Marijo Sanes M.D.   On: 11/07/2021 14:03   DG Chest Port 1 View  Result Date: 11/07/2021 CLINICAL DATA:  Chest tube placement.  Shortness of breath. EXAM: PORTABLE CHEST 1 VIEW COMPARISON:  Chest radiograph 11/06/2021 FINDINGS: The cardiomediastinal silhouette is unchanged with normal heart size. A right-sided chest tube remains in place. There is a persistent trace right apical pneumothorax. Mild right basilar opacity has slightly increased and likely reflects atelectasis. No sizable pleural effusion is evident. IMPRESSION: Persistent trace right apical pneumothorax with chest tube in place. Mild right basilar atelectasis. Electronically Signed   By: Logan Bores M.D.   On: 11/07/2021 08:31   DG Chest Port 1 View  Result Date: 11/06/2021 CLINICAL DATA:  Pneumothorax with right chest tube in place. EXAM: PORTABLE CHEST 1 VIEW COMPARISON:  Portable chest yesterday at 1:21 p.m. FINDINGS: 5:11 a.m., 11/06/2021. Pigtail right chest pleural tube remains in place with pigtail in the  lateral aspect of the mid chest. Small right pneumothorax with apical and lateral basal components appears improved, estimated 5% or less volume. The lungs are clear. No pleural effusion is seen. The cardiac size and mediastinal silhouette are normal. No acute osseous abnormalities. IMPRESSION: Small apical/lateral basal right pneumothorax is unchanged. Chest tube positioning is unchanged. No other evidence of acute chest process. Electronically Signed   By: Telford Nab M.D.   On: 11/06/2021 06:40      LOS: 16 days    Phillips Climes , MD [facility] 11/07/2021, 2:34 PM   If 7PM-7AM, please contact night-coverage [email or web address]

## 2021-11-08 ENCOUNTER — Other Ambulatory Visit (HOSPITAL_COMMUNITY): Payer: Self-pay

## 2021-11-08 ENCOUNTER — Inpatient Hospital Stay (HOSPITAL_COMMUNITY): Payer: Medicare HMO

## 2021-11-08 DIAGNOSIS — A419 Sepsis, unspecified organism: Secondary | ICD-10-CM | POA: Diagnosis not present

## 2021-11-08 DIAGNOSIS — R531 Weakness: Secondary | ICD-10-CM | POA: Diagnosis not present

## 2021-11-08 DIAGNOSIS — R6520 Severe sepsis without septic shock: Secondary | ICD-10-CM | POA: Diagnosis not present

## 2021-11-08 DIAGNOSIS — J90 Pleural effusion, not elsewhere classified: Secondary | ICD-10-CM | POA: Diagnosis not present

## 2021-11-08 DIAGNOSIS — R69 Illness, unspecified: Secondary | ICD-10-CM | POA: Diagnosis not present

## 2021-11-08 DIAGNOSIS — K7031 Alcoholic cirrhosis of liver with ascites: Secondary | ICD-10-CM | POA: Diagnosis not present

## 2021-11-08 DIAGNOSIS — Z23 Encounter for immunization: Secondary | ICD-10-CM | POA: Diagnosis not present

## 2021-11-08 DIAGNOSIS — J9601 Acute respiratory failure with hypoxia: Secondary | ICD-10-CM | POA: Diagnosis not present

## 2021-11-08 DIAGNOSIS — R0602 Shortness of breath: Secondary | ICD-10-CM | POA: Diagnosis not present

## 2021-11-08 DIAGNOSIS — J189 Pneumonia, unspecified organism: Secondary | ICD-10-CM | POA: Diagnosis not present

## 2021-11-08 MED ORDER — LACTULOSE 10 GM/15ML PO SOLN
20.0000 g | Freq: Two times a day (BID) | ORAL | 0 refills | Status: DC
  Filled 2021-11-08: qty 946, 16d supply, fill #0

## 2021-11-08 MED ORDER — PYRIDOXINE HCL 50 MG PO TABS
50.0000 mg | ORAL_TABLET | Freq: Every day | ORAL | 0 refills | Status: DC
  Filled 2021-11-08: qty 30, 30d supply, fill #0

## 2021-11-08 MED ORDER — THIAMINE HCL 100 MG PO TABS
100.0000 mg | ORAL_TABLET | Freq: Every day | ORAL | 0 refills | Status: DC
Start: 2021-11-08 — End: 2021-12-18
  Filled 2021-11-08: qty 30, 30d supply, fill #0

## 2021-11-08 MED ORDER — VITAMIN D3 25 MCG PO TABS
1000.0000 [IU] | ORAL_TABLET | Freq: Every day | ORAL | 0 refills | Status: DC
  Filled 2021-11-08: qty 30, 30d supply, fill #0

## 2021-11-08 MED ORDER — FUROSEMIDE 40 MG PO TABS
40.0000 mg | ORAL_TABLET | Freq: Every day | ORAL | 0 refills | Status: DC
  Filled 2021-11-08: qty 30, 30d supply, fill #0

## 2021-11-08 MED ORDER — SPIRONOLACTONE 100 MG PO TABS
100.0000 mg | ORAL_TABLET | Freq: Every day | ORAL | 0 refills | Status: DC
  Filled 2021-11-08: qty 30, 30d supply, fill #0

## 2021-11-08 MED ORDER — CYANOCOBALAMIN 1000 MCG PO TABS
1000.0000 ug | ORAL_TABLET | Freq: Every day | ORAL | 0 refills | Status: DC
Start: 2021-11-09 — End: 2021-12-18
  Filled 2021-11-08: qty 30, 30d supply, fill #0

## 2021-11-08 MED ORDER — ZINC SULFATE 220 (50 ZN) MG PO TABS
220.0000 mg | ORAL_TABLET | Freq: Every day | ORAL | 0 refills | Status: DC
  Filled 2021-11-08: qty 30, 30d supply, fill #0

## 2021-11-08 NOTE — Progress Notes (Signed)
Occupational Therapy Treatment Patient Details Name: Katie Holland MRN: [id number] DOB: April 05, 1966 Today's Date: 11/08/2021   History of present illness 56 y/o female presented to ED on 10/22/21 for SOB and chest tightness after she received paracentesis earlier that day. Admitted for sepsis 2/2 E. coli bacteremia and CAP. Underwent TIPS 11/01/21.PMH: ETOH cirrhosis   OT comments  Pt instructed in set up and use of DME. Educated in fall prevention and energy conservation strategies. Pt ambulating to bathroom with min guard assist, steadying on furniture and sink. Pt eager to discharge home today.    Recommendations for follow up therapy are one component of a multi-disciplinary discharge planning process, led by the attending physician.  Recommendations may be updated based on patient status, additional functional criteria and insurance authorization.    Follow Up Recommendations  Home health OT    Assistance Recommended at Discharge Set up Supervision/Assistance  Patient can return home with the following  A little help with walking and/or transfers;A little help with bathing/dressing/bathroom   Equipment Recommendations  Tub/shower seat    Recommendations for Other Services      Precautions / Restrictions Precautions Precautions: Fall Restrictions Weight Bearing Restrictions: No       Mobility Bed Mobility Overal bed mobility: Modified Independent                  Transfers                         Balance Overall balance assessment: Needs assistance   Sitting balance-Leahy Scale: Good     Standing balance support: No upper extremity supported Standing balance-Leahy Scale: Fair                             ADL either performed or assessed with clinical judgement   ADL Overall ADL's : Needs assistance/impaired     Grooming: Wash/dry hands;Standing;Supervision/safety           Upper Body Dressing : Set up;Sitting   Lower Body  Dressing: Set up;Sitting/lateral leans   Toilet Transfer: Ambulation;Min guard Toilet Transfer Details (indicate cue type and reason): pt reaching for furniture, door frame Toileting- Clothing Manipulation and Hygiene: Supervision/safety;Sitting/lateral lean       Functional mobility during ADLs: Min guard General ADL Comments: Instructed pt in set up of shower seat and 3 in 1, multiple uses of 3 in 1 and benefits of shower seat vs tub bench.    Extremity/Trunk Assessment              Vision       Perception     Praxis      Cognition Arousal/Alertness: Awake/alert Behavior During Therapy: WFL for tasks assessed/performed Overall Cognitive Status: Within Functional Limits for tasks assessed                                          Exercises      Shoulder Instructions       General Comments      Pertinent Vitals/ Pain       Pain Assessment Pain Assessment: No/denies pain  Home Living  Prior Functioning/Environment              Frequency  Min 2X/week        Progress Toward Goals  OT Goals(current goals can now be found in the care plan section)  Progress towards OT goals: Progressing toward goals  Acute Rehab OT Goals OT Goal Formulation: With patient Time For Goal Achievement: 11/07/21 Potential to Achieve Goals: Good  Plan Discharge plan remains appropriate    Co-evaluation                 AM-PAC OT "6 Clicks" Daily Activity     Outcome Measure   Help from another person eating meals?: None Help from another person taking care of personal grooming?: A Little Help from another person toileting, which includes using toliet, bedpan, or urinal?: A Little Help from another person bathing (including washing, rinsing, drying)?: A Little Help from another person to put on and taking off regular upper body clothing?: None Help from another person to put on and  taking off regular lower body clothing?: A Little 6 Click Score: 20    End of Session Equipment Utilized During Treatment: Rolling walker (2 wheels)  OT Visit Diagnosis: Unsteadiness on feet (R26.81);Other abnormalities of gait and mobility (R26.89);Muscle weakness (generalized) (M62.81)   Activity Tolerance Patient tolerated treatment well   Patient Left in bed;with call bell/phone within reach   Nurse Communication          Time: 1025-8527 OT Time Calculation (min): 16 min  Charges: OT General Charges $OT Visit: 1 Visit OT Treatments $Self Care/Home Management : 8-22 mins  Nestor Lewandowsky, OTR/L Acute Rehabilitation Services Pager: [phone] Office: [phone]   Malka So 11/08/2021, 2:02 PM

## 2021-11-08 NOTE — Discharge Instructions (Signed)
Follow with Primary MD Billie Ruddy, MD in 7 days   Get CBC, CMP,  checked  by Primary MD next visit.    Activity: As tolerated with Full fall precautions use walker/cane & assistance as needed   Disposition Home    Diet: 2 g sodium diet  On your next visit with your primary care physician please Get Medicines reviewed and adjusted.   Please request your Prim.MD to go over all Hospital Tests and Procedure/Radiological results at the follow up, please get all Hospital records sent to your Prim MD by signing hospital release before you go home.   If you experience worsening of your admission symptoms, develop shortness of breath, life threatening emergency, suicidal or homicidal thoughts you must seek medical attention immediately by calling 911 or calling your MD immediately  if symptoms less severe.  You Must read complete instructions/literature along with all the possible adverse reactions/side effects for all the Medicines you take and that have been prescribed to you. Take any new Medicines after you have completely understood and accpet all the possible adverse reactions/side effects.   Do not drive, operating heavy machinery, perform activities at heights, swimming or participation in water activities or provide baby sitting services if your were admitted for syncope or siezures until you have seen by Primary MD or a Neurologist and advised to do so again.  Do not drive when taking Pain medications.    Do not take more than prescribed Pain, Sleep and Anxiety Medications  Special Instructions: If you have smoked or chewed Tobacco  in the last 2 yrs please stop smoking, stop any regular Alcohol  and or any Recreational drug use.  Wear Seat belts while driving.   Please note  You were cared for by a hospitalist during your hospital stay. If you have any questions about your discharge medications or the care you received while you were in the hospital after you are  discharged, you can call the unit and asked to speak with the hospitalist on call if the hospitalist that took care of you is not available. Once you are discharged, your primary care physician will handle any further medical issues. Please note that NO REFILLS for any discharge medications will be authorized once you are discharged, as it is imperative that you return to your primary care physician (or establish a relationship with a primary care physician if you do not have one) for your aftercare needs so that they can reassess your need for medications and monitor your lab values.

## 2021-11-08 NOTE — Discharge Summary (Signed)
Physician Discharge Summary  Katie Holland OXB:[id number] DOB: April 12, 1966 DOA: 10/22/2021  PCP: Billie Ruddy, MD  Admit date: 10/22/2021 Discharge date: 11/08/2021  Admitted From: Home Disposition:  Home  Recommendations for Outpatient Follow-up:  Follow up with PCP in 1-2 weeks Please obtain CMP/CBC in one week Please recheck TSH, free T4, vitamin D, B12, B6, zinc as an outpatient   Home Health:YES   Discharge Condition:Stable CODE STATUS: DNR Diet recommendation: Low-salt diet  Brief/Interim Summary: The patient is a 56 year old African-American female with past medical history significant for but not limited to alcoholic liver failure, history of alcohol abuse, history of tobacco abuse, history of esophageal varices, history of upper GI bleed as well as other comorbidities who presented with worsening shortness of breath and increased O2 requirement.  She underwent an ultrasound-guided therapeutic paracentesis that yielded about 9.8 L of clear yellow fluid and had tolerated well and was administered 75 g of albumin.  Subsequently she is noted to be short of breath and had an elevated heart rate and high blood pressure even after paracentesis.  She is noted to be saturating 89% and increased work of bleeding and to be placed on 4 L of supplemental oxygen which improved her O2 saturations 96%.  There is attempt to wean her from her 4 L but because she failed to do so she was sent the ED for further evaluation.  She is noted to be somewhat more lethargic than usual and she was on home Lasix and spironolactone.  She also had some abdominal pain and does not drink anymore and her last drink was 3 months ago.  She continues to smoke cigarettes 6 to 7 cigarettes a day and think about quitting.  She had no bleeding and her last thoracentesis was 9 months ago she stated.  While in the ED she is noted to have a lactic acid of 2.3 and a chest x-ray that was worrisome for her right middle right lower lobe  large right pleural effusion but also could be pneumonia versus CHF.  GI was notified of patient's admission and blood cultures and sputum cultures were obtained.  She underwent a CT of the chest which showed a right large sided pleural effusion with right middle lobe and right lower lobe consolidation as well as residual ascites in the abdomen following paracentesis.  She had cirrhotic changes of the liver that were seen.  She is admitted for severe sepsis given that her lactic acid was level greater than 2 and that she had new oxygen requirement of 4 L.  She was placed on antibiotics azithromycin and ceftriaxone.  She is found to have 1 out of 4 bottles of E. coli bacteremia with possible pneumonia and she is continued on ceftriaxone and azithromycin.  IR was also consulted and she underwent a 1.8 L thoracentesis and there was question removed with there this was a hepatic hydrothorax.  She is continue on ceftriaxone and azithromycin and she did have large ascites that is refractory to high-dose diuretics.  GI evaluated and recommended resuming her home diuretic doses of Lasix 40 mg twice daily spironolactone 20 mg daily follow-up for a low sodium diet.  Patient was stressed to get a TIPS however this was canceled in the setting of her bacteremia  GI has initiated her back on her home diuretics but if her renal function is okay there we will try increasing as she tolerates tomorrow.  They are recommending a discharge that she stay on Lasix 40 mg  twice daily and spironolactone 200 g daily but they may increase it while she is in house.  Repeat chest x-ray yesterday showed a moderate pleural effusion again and today CXR showed "Large right pleural effusion, increased in size since the study from 1 day prior with worsened aeration of the right lung."   Repeat CXR showed worsening large right pleural effusion and because of her shortness of breath while ambulation a repeat thoracentesis was ordered.  GI  recommending continuing diuretics and may increase the dose and recommending continuing low-sodium diet.   She is s/p Thoracentesis again and 1 Liter was removed on 10/25/21.  GI has increased her diuretics to 40 mg of Lasix 3 times daily and 300 mg spironolactone daily however yesterday when I  walked in the room she was eating a large bag of Lays potato chips so I advised her about her salt intake.   10/27/21 she was actively vomiting and retching and did not feel as good. She is improved 10/28/21 but back on Supplemental O2 and complains of slight SOB.  GI will reach out to interventional radiology about timing of the TIPS and will continue antibiotics for 7 days for her E. coli bacteremia.  GI recommending continuing current dose of diuretics and not escalating dose yet   10/28/21 Had worsening of he Pleural Effusion and had some dyspnea so Pulmonary was consulted who feels that she has a hepatic hydrothorax and recommending definitive management with TIPS but recommending another thoracentesis for comfort if she becomes more dyspenic.  GI spoken with IR and they will write a note outlining the plans regarding future TIPS.  Her antibiotics were switched from ceftriaxone to Ancef for 2 more day to complete 7 days of gram-negative coverage.   TIPS performed successfully on 5/12. -  Post repeat thoracentesis, patient developed worsening right-sided pneumothorax. IR placed right chest tube on 5/15.  Chest tube was discontinued 5/18, repeat chest x-ray 5/19 remains with no pneumothorax.  Severe sepsis Secondary to E. Coli bacteremia and pneumonia. Resolved with antibiotics.   E. Coli bacteremia Patient treated with Ceftriaxone IV and transitioned to Cefazolin. Completed a 7 day course of antibiotics. Likely translocation from GI source. Resolved.   Community acquired pneumonia Patient treated with Ceftriaxone and azithromycin. Completed course.   Acute respiratory failure with hypoxia Secondary to  pneumonia and pleural effusion from hepatic hydrothorax. Patient is s/p multiple thoracenteses. Weaned to room air. Resolved.   Decompensated alcoholic cirrhosis Ascites Esophageal varices GI on board. Patient is currently on high dose diuretic therapy. GI recommending TIPS. IR consulted for TIPS which was successfully completed on 5/12. -Continue Lasix and Spironolactone -Continue lactulose -IR follow-up as an outpatient   Right-sided pneumothorax Post-thoracentesis. Patient is on room air.  Remains with chest tube, inserted on 5/15.  IR. -Management per IR, chest tube discontinued 5/18, chest x-ray this morning with no evidence of pneumothorax.  .   Hyperammoniemia No note of hepatic encephalopathy, but patient developed asterixis. Lactulose therapy initiated.   Elevated troponin Troponin peak of 234. Assessment is demand ischemia.   Non thyroidal illness Likely diagnosis in critically ill patient. Low TSH and T3 with normal free T4. Recommend repeat thyroid testing as an outpatient when no longer acutely ill.   Microcytic anemia B12 Deficiency Chronic and stable hemoglobin. -Continue B12 supplementation   Vitamin D deficiency Vitamin B6 deficiency Zinc deficiency -Continue vitamin D3, vitamin B6 and zine supplementation   Nausea/vomiting Resolved.   Thrombocytopenia Mild. In setting of liver disease.  Lactic acidosis In setting of liver failure and sepsis.    Severe malnutrition In setting of chronic illness. Dietitian consulted.  - Encourage good PO intake    Discharge Diagnoses:  Principal Problem:   Sepsis ([place]) Active Problems:   Alcoholic cirrhosis of liver with ascites (HCC)   Tobacco dependence   Hypokalemia   Esophageal varices (HCC)   Ascites   Pleural effusion   Acute respiratory failure with hypoxia (HCC)   CAP (community acquired pneumonia)   Increased ammonia level   Elevated troponin   Abnormal TSH   Elevated lactic acid level    Pneumothorax on right    Discharge Instructions  Discharge Instructions     Diet - low sodium heart healthy   Complete by: As directed    Discharge instructions   Complete by: As directed    Follow with Primary MD Billie Ruddy, MD in 7 days   Get CBC, CMP,  checked  by Primary MD next visit.    Activity: As tolerated with Full fall precautions use walker/cane & assistance as needed   Disposition Home    Diet: 2 g sodium diet  On your next visit with your primary care physician please Get Medicines reviewed and adjusted.   Please request your Prim.MD to go over all Hospital Tests and Procedure/Radiological results at the follow up, please get all Hospital records sent to your Prim MD by signing hospital release before you go home.   If you experience worsening of your admission symptoms, develop shortness of breath, life threatening emergency, suicidal or homicidal thoughts you must seek medical attention immediately by calling 911 or calling your MD immediately  if symptoms less severe.  You Must read complete instructions/literature along with all the possible adverse reactions/side effects for all the Medicines you take and that have been prescribed to you. Take any new Medicines after you have completely understood and accpet all the possible adverse reactions/side effects.   Do not drive, operating heavy machinery, perform activities at heights, swimming or participation in water activities or provide baby sitting services if your were admitted for syncope or siezures until you have seen by Primary MD or a Neurologist and advised to do so again.  Do not drive when taking Pain medications.    Do not take more than prescribed Pain, Sleep and Anxiety Medications  Special Instructions: If you have smoked or chewed Tobacco  in the last 2 yrs please stop smoking, stop any regular Alcohol  and or any Recreational drug use.  Wear Seat belts while driving.   Please  note  You were cared for by a hospitalist during your hospital stay. If you have any questions about your discharge medications or the care you received while you were in the hospital after you are discharged, you can call the unit and asked to speak with the hospitalist on call if the hospitalist that took care of you is not available. Once you are discharged, your primary care physician will handle any further medical issues. Please note that NO REFILLS for any discharge medications will be authorized once you are discharged, as it is imperative that you return to your primary care physician (or establish a relationship with a primary care physician if you do not have one) for your aftercare needs so that they can reassess your need for medications and monitor your lab values.   Increase activity slowly   Complete by: As directed    No wound care  Complete by: As directed       Allergies as of 11/08/2021       Reactions   Contrast Media [iodinated Contrast Media] Itching, Swelling   Needs 13-hr prep   Latex Itching, Swelling, Other (See Comments)   No breathing impairment, however   Sulfa Antibiotics Itching, Swelling, Other (See Comments)   No breathing impairment, however        Medication List     TAKE these medications    cyanocobalamin 1000 MCG tablet Take 1 tablet (1,000 mcg total) by mouth daily. Start taking on: Nov 09, 2021   furosemide 40 MG tablet Commonly known as: LASIX Take 1 tablet (40 mg total) by mouth daily. Start taking on: Nov 09, 2021 What changed:  medication strength when to take this   lactulose 10 GM/15ML solution Commonly known as: CHRONULAC Take 30 mLs (20 g total) by mouth 2 (two) times daily.   multivitamin with minerals Tabs tablet Take 1 tablet by mouth daily.   prednisoLONE acetate 1 % ophthalmic suspension Commonly known as: PRED FORTE Place 1 drop into both eyes 2 (two) times daily.   pyridOXINE 50 MG tablet Commonly known as:  B-6 Take 1 tablet (50 mg total) by mouth daily. Start taking on: Nov 09, 2021   spironolactone 100 MG tablet Commonly known as: ALDACTONE Take 1 tablet (100 mg total) by mouth daily. Start taking on: Nov 09, 2021 What changed:  how much to take how to take this when to take this additional instructions   thiamine 100 MG tablet Take 1 tablet (100 mg total) by mouth daily.   Vitamin D3 25 MCG tablet Commonly known as: Vitamin D Take 1 tablet (1,000 Units total) by mouth daily. Start taking on: Nov 09, 2021               Durable Medical Equipment  (From admission, onward)           Start     Ordered   10/25/21 1306  For home use only DME Other see comment  Once       Comments: Standard Cane  Question:  Length of Need  Answer:  Lifetime   10/25/21 1305   10/25/21 1147  For home use only DME 3 n 1  Once        10/25/21 1146   10/25/21 1147  For home use only DME Tub bench  Once        10/25/21 1146            Follow-up Information     [facility] Follow up.   Specialty: [place] Why: A representative from [facility] will contact you to arrange datee and time to begin your services. Contact information: [place] [address] [phone]         [facility] Follow up.   Why: Please follow up with Dr. Laurence Ferrari in approximately one month. A scheduler from our office will call you with a date/time of your appointment. Please call our office with any questions/concerns prior to your visit. Contact information: [place] [address] [phone]         Billie Ruddy, MD Follow up.   Specialty: Family Medicine Contact information: [place] [address] [phone]         Yetta Flock, MD Follow up.   Specialty: Gastroenterology Contact information: [address]  [place]  [address] [phone]                Allergies  Allergen Reactions   Contrast Media [Iodinated Contrast Media] Itching and Swelling    Needs 13-hr prep   Latex Itching, Swelling and Other (See Comments)    No breathing impairment, however   Sulfa Antibiotics Itching, Swelling and Other (See Comments)    No breathing impairment, however    Consultations: [facility] Interventional radiology     Procedures/Studies: DG Chest 1 View  Result Date: 10/23/2021 CLINICAL DATA:  Status post thoracentesis EXAM: CHEST  1 VIEW COMPARISON:  Chest x-ray dated Oct 22, 2021 FINDINGS: Visualized cardiac and mediastinal contours are within normal limits. Moderate pleural effusion decreased. No pneumothorax. IMPRESSION: No evidence of pneumothorax status post thoracentesis. Electronically Signed   By: Yetta Glassman M.D.   On: 10/23/2021 08:51   DG Chest 2 View  Result Date: 10/22/2021 CLINICAL DATA:  Suspected sepsis right pleural effusion EXAM: CHEST - 2 VIEW COMPARISON:  Previous studies including the examination of 01/21/2019 FINDINGS: Transverse diameter of heart is increased. Evaluation of right mid and right lower lung fields for infiltrates is limited by the effusion. Central pulmonary vessels are more prominent. There is prominence of interstitial markings in the left parahilar region, possibly mild interstitial edema. Large right pleural effusion is seen. There is no significant left pleural effusion. There is no pneumothorax. IMPRESSION: There is opacification of right mid and right lower lung fields suggesting large right pleural effusion. Possibility of underlying atelectasis/pneumonia is not excluded. Central pulmonary vessels are more prominent. Increased interstitial markings are seen in the parahilar regions suggesting CHF. Electronically Signed   By: Elmer Picker M.D.   On: 10/22/2021 16:08   DG Abd 1 View  Result Date: 10/23/2021 CLINICAL DATA:  Abdomen pain  EXAM: ABDOMEN - 1 VIEW COMPARISON:  None Available. FINDINGS: The bowel gas pattern is normal. No radio-opaque calculi or other significant radiographic abnormality are seen. IUD in the pelvis. Hardware in the lumbar spine. IMPRESSION: Negative. Electronically Signed   By: Donavan Foil M.D.   On: 10/23/2021 22:09   CT CHEST WO CONTRAST  Result Date: 10/22/2021 CLINICAL DATA:  Increasing shortness of breath and known right pleural effusion EXAM: CT CHEST WITHOUT CONTRAST TECHNIQUE: Multidetector CT imaging of the chest was performed following the standard protocol without IV contrast. RADIATION DOSE REDUCTION: This exam was performed according to the departmental dose-optimization program which includes automated exposure control, adjustment of the mA and/or kV according to patient size and/or use of iterative reconstruction technique. COMPARISON:  Chest x-ray from earlier in the same day. FINDINGS: Cardiovascular: Limited due to lack of IV contrast. No aneurysmal dilatation is seen. No cardiac enlargement is noted. No coronary calcifications are seen. Mediastinum/Nodes: Thoracic inlet demonstrates scattered calcifications within the left lobe of the thyroid. No discrete nodule is noted. No hilar or mediastinal adenopathy is noted. The esophagus as visualized is within normal limits. Lungs/Pleura: Left lung is well aerated. A small 5 mm ground-glass nodule is noted in the left upper lobe best seen on image number 26 of series 5. Near complete opacification of the right hemithorax is noted secondary to large pleural effusion. No discrete diaphragmatic defect is identified. The aerated right lung appears within normal limits predominately in the upper lobe. Consolidation in the right middle and lower lobe is seen. No discrete mass is noted although evaluation is limited due to lack of IV contrast. Upper Abdomen:  Visualized upper abdomen demonstrates cirrhotic changes of the liver. Scattered small gallstones are  seen. Mild ascites is seen as well. Musculoskeletal: No chest wall mass or suspicious bone lesions identified. IMPRESSION: Large right-sided pleural effusion with right middle and lower lobe consolidation. Residual ascites in the abdomen following paracentesis. Cirrhotic changes of liver are seen. Small gallstones without definitive complicating factors. 5 mm left ground-glass pulmonary nodule within the upper lobe. No routine follow-up imaging is recommended per Fleischner Society Guidelines. These guidelines do not apply to immunocompromised patients and patients with cancer. Follow up in patients with significant comorbidities as clinically warranted. For lung cancer screening, adhere to Lung-RADS guidelines. Reference: Radiology. 2017; 284(1):228-43. Scattered calcifications in the left lobe of the thyroid. No discrete nodule is noted Electronically Signed   By: Inez Catalina M.D.   On: 10/22/2021 21:07   IR Tips  Result Date: 11/02/2021 CLINICAL DATA:  56 year old female with a history of alcoholic cirrhosis complicated by recurrent large volume ascites and hepatic hydrothorax. She presents for paracentesis, thoracentesis and TIPS creation. EXAM: 1. Ultrasound-guided paracentesis 2. Ultrasound-guided right thoracentesis 3. Ultrasound-guided right common femoral venous access for introduction of intravascular ultrasound 4. TIPS creation MEDICATIONS: As antibiotic prophylaxis, 1 g Rocephin was ordered pre-procedure and administered intravenously within one hour of incision. ANESTHESIA/SEDATION: General - as administered by the Anesthesia department CONTRAST:  110 mL Omnipaque 300 FLUOROSCOPY TIME:  Radiation exposure index: 461 mGy reference air kerma COMPLICATIONS: None immediate. PROCEDURE: Informed written consent was obtained from the patient after a thorough discussion of the procedural risks, benefits and alternatives. All questions were addressed. Maximal Sterile Barrier Technique was utilized including  caps, mask, sterile gowns, sterile gloves, sterile drape, hand hygiene and skin antiseptic. A timeout was performed prior to the initiation of the procedure. PARACENTESIS The abdomen was interrogated with ultrasound. There is a large amount of free ascites. A suitable skin entry site was selected and marked. The region was sterilely prepped and draped in the standard fashion using chlorhexidine skin prep. Local anesthesia was attained by infiltration with 1% lidocaine. A small dermatotomy was made. Under ultrasound guidance, a 6 French Safe-T-Centesis catheter was advanced into the fluid collection. Paracentesis was then performed yielding approximately 3.5 L of ascites. The catheter was removed and a bandage applied. THORACENTESIS The right chest was interrogated with ultrasound. There is a large right-sided pleural effusion. A suitable skin entry site was selected and marked. The region was sterilely prepped and draped in the standard fashion using chlorhexidine skin prep. Local anesthesia was attained by infiltration with 1% lidocaine. A small dermatotomy was made. Under ultrasound guidance, a 6 French Safe-T-Centesis catheter was advanced into the right pleural fluid. Thoracentesis was performed yielding 3 L of straw-colored pleural fluid. The catheter was removed. A bandage was applied. INTRAVASCULAR [place] The right common femoral vein was interrogated with ultrasound and found to be widely patent. An image was obtained and stored for the medical record. Local anesthesia was attained by infiltration with 1% lidocaine. A small dermatotomy was made. Under real-time sonographic guidance, the vessel was punctured with a 21 gauge micropuncture needle. Using standard technique, the initial micro needle was exchanged over a 0.018 micro wire for a transitional 4 [place] micro sheath. The micro sheath was then exchanged over a 0.035 wire for a 9 French vascular sheath. The 8 French intracardiac echocardiography (ICE)  catheter was then advanced over a Bentson wire into the inferior cavoatrial junction. Intravascular ultrasound was then performed evaluating the supra hepatic and intra  hepatic IVC, hepatic veins and portal veins. The portal veins are diminutive but patent. TIPS The right internal jugular vein was interrogated with ultrasound and found to be widely patent. An image was obtained and stored for the medical record. Local anesthesia was attained by infiltration with 1% lidocaine. A small dermatotomy was made. Under real-time sonographic guidance, the vessel was punctured with a 21 gauge micropuncture needle. Using standard technique, the initial micro needle was exchanged over a 0.018 micro wire for a transitional 4 [place] micro sheath. The micro sheath was then exchanged over a 0.035 wire for a 5 French vascular sheath. Attention was turned to the right abdomen. The abdomen was interrogated with ultrasound. The liver is shrunken and extremely heterogeneous. The gallbladder is mildly distended. There was great difficulty in identifying the diminutive portal veins. Several small portal venous radicles were accessed, however the trajectory would not facilitate passage of a wire into the central portal venous system. Ultimately, using a combination of mid epigastric transabdominal ultrasound guidance, and a right lateral intra axillary needle approach, a 22 gauge needle was successfully advanced along a transhepatic course from lateral to medial and used to select a peripheral branch of the right portal vein. A 0.018 wire was navigated in the central portal veins. The needle was then exchanged and a 5 [place] Accustick sheath advanced over the needle and into the main portal vein. Transhepatic portal venography was then performed. The intrahepatic and main portal veins are markedly diminutive. The 5 French catheter is essentially occlusive in the right portal vein. There is marked hypertrophy of the posterior gastric vein  filling a large portosystemic collateral. The Nitinol portion of the 0.018 wire was inserted with the tip at the portal venous confluence to provide an intra portal marker. Attention was turned back to the 5 French sheath in the right internal jugular vein. The sheath was aspirated and flushed. A wire was introduced and passed through the heart into the inferior vena cava. The 5 French sheath was removed. The skin tract was dilated to 10 [place] and a 9 [place] braided hydrophilic tips sheath advanced over the wire and positioned in the inferior vena cava. A 5 [place] MPA catheter was inserted coaxially through the sheath and the sheath was brought back into the right atrium. The catheter was then manipulated into the origin of the middle hepatic vein and the middle hepatic vein was selected over a Bentson wire. Once the catheter was in the middle hepatic vein, a hepatic venogram was performed confirming the venous anatomy. It is known that this represents the middle hepatic vein based on evaluation of prior CT imaging which demonstrates a diminutive and extremely posterior right hepatic vein. A superstiff Amplatz wire was advanced distally into the middle hepatic vein. The tips sheath was advanced over the wire into the middle hepatic vein. Initially, and are gone scorpion tips set was advanced over the wire, through the sheath and into the middle hepatic vein. And attempts at puncture was made, however purchase within the middle hepatic vein is tenuous and the scorpion needle could not be advanced through the hepatic parenchyma without buckling out of the middle hepatic vein. Therefore, the scorpion system was removed. The standard Ring set and Colopinto needle were next introduced. A pass was made with colo pinto needle. On lateral imaging, it is clear that the middle hepatic vein and the colo pinto needle are quite posterior with respect to the right portal vein. It is obvious that it will be  extremely challenging  to make this pass naturally. Given the very small size of the liver, and the tenuous purchase within the middle hepatic vein there is concern for complication with multiple needle passes. Therefore, the decision was made to proceed with gun site technique to facilitate tips creation. The tips sheath was left in the middle hepatic vein but the colo pinto needle was removed. A 4 French 65 cm snare was advanced through the sheath and positioned in the middle hepatic vein. Similarly, a second 4 French 65 cm snare was advanced through the trans hepatic portal venous access and placed in the central right portal vein. The image intensifier was then angulated until the 2 snares overlapped. A 22 gauge 15 cm Chiba needle was then carefully advanced from the anterior abdominal wall using real-time fluoroscopic guidance through the liver and through both snares. A 300 cm glidewire advantage microwire was then advanced through the needle and the needle was retracted leaving the wire in place. The wire was then successfully snared with the snare located in the middle hepatic vein. The wire was brought back through the tips sheath and out the hub such that there was flossing from the anterior abdominal wall through the anterior liver, through the right portal vein, into the middle hepatic vein, up the sheath an out the IJ access. A coaxial system was then created using a 150 cm 0.014 CXI support catheter inside a 130 cm 0.035 CXI support catheter. The coaxial catheter system was then loaded over the 0.014 glidewire advantage catheter and advanced from the IJ access through the liver and out the anterior abdominal wall (only the 0.014 portion of the system was advanced through the anterior abdominal wall). The 0.035 CXI catheter was position with the radiopaque marker in the region of the right portal vein as identified by the intra portal snare. While maintaining through and through wire access, the 0.014 CXI support catheter was  removed. A second 014 glidewire advantage wire was then advanced coaxially through the 0.035 catheter and navigated into the portal vein and down the SMV. Next, the through and through flossing wire was carefully removed. The 0.014 CXI support catheter was reintroduced over the new Glidewire Advantage wire coaxially through the 0.035 CXI support catheter. The catheter system was then carefully advanced through the access site in the right portal vein and into the central portal venous system. Contrast was injected confirming successful transhepatic portosystemic access. The 0.014 catheter and wire were removed. A superstiff Amplatz wire was advanced through the 035 catheter into the distal portal vein. The CXI support catheter was exchanged for a marker pigtail catheter. Portal venography was performed. Again, this confirms a large portosystemic collateral arising from the posterior gastric vein. Portal and systemic pressures were then measured. Portal pressure: 30 mm Hg Right atrial pressure: 10 mm Hg Portosystemic gradient: 20 mm Hg (severe portal hypertension) A 6 cm covered, 2 cm un covered Gore Viatorr stent graft was then selected. The superstiff Amplatz wire was advanced through the marker pigtail catheter the pigtail catheter removed. The transhepatic tract was balloon dilated to 6 mm using a 6 x 40 mm mustang balloon. The tips sheath was then advanced over the balloon and into the main portal vein. At this time, maneuvers were performed in order to remove the snare around the sheath from the transhepatic portal venous access. To accomplish this, the tips sheath was pulled back to just within the transhepatic tract. The snare was closed around the existing superstiff  Amplatz wire. A 5 French angled catheter and Glidewire were advanced coaxially through the tips sheath in the Glidewire and catheter system carefully passed into the main portal vein external to the closed snare. Portal venous access was  confirmed by aspiration of blood and catheter injection. The glidewire was exchanged for a superstiff Amplatz wire. The initial superstiff Amplatz wire was then released from the snare and removed. This allowed successful capture and removal of the snare device. The tips sheath was readvanced over the superstiff Amplatz wire. The Viatorr stent graft was introduced through the sheath and the metal portion of the stent graph un covered. This deployed nicely within the main portal vein. The stent graft was then pulled back in till it was snug against the right portal venous entry site. The remainder of the stent graft was un sheath and then deployed. The stent graft was post dilated to 8 mm using an 8 x 40 mm Conquest balloon. The balloon was exchanged for the pigtail catheter which was reintroduced into the main portal vein. Repeat portal venography was performed. There is preferential flow through the newly created tips. The stent graft is widely patent and flow is brisk. There is decreased flow through the large portal systemic collateral system. Pressures were remeasured. Portal pressure: 23 mm Hg Right atrial pressure: 18 mm Hg Portosystemic gradient: 5 mm Hg (normal portal venous pressure). At this time, some consideration was made for proceeding with embolization of the hypertrophic posterior gastric vein and associated shunt system. However, flow appears to be preferential through the tips. The decision was made to defer embolization at this time. This could be embolized in the future if it becomes clinically symptomatic. The tips sheath was removed and hemostasis attained by manual pressure. The intravascular ultrasound device was removed and the right common femoral sheath removed and hemostasis attained by manual pressure. The transhepatic portal venous access sheath was removed. Bandages were applied all 3 sites. The patient tolerated the procedure well and was taken to PACU in stable condition. IMPRESSION:  1. Paracentesis yielding 3.5 L ascites. 2. Right thoracentesis yielding 3 L pleural fluid. 3. Successful creation of a right middle hepatic to right portal venous tips utilizing a 6 cm covered 2 cm uncovered Gore Viatorr stent graft post dilated to 8 mm. 4. Pre TIPS portosystemic gradient of 20 mmHg consistent with severe portal hypertension. Post TIPS portosystemic gradient of 5 mm Hg which is within normal limits. Signed, Criselda Peaches, MD, [facility] Radiology Electronically Signed   By: Jacqulynn Cadet M.D.   On: 11/02/2021 10:01   IR US Guide Vasc Access Right  Result Date: 11/02/2021 CLINICAL DATA:  56 year old female with a history of alcoholic cirrhosis complicated by recurrent large volume ascites and hepatic hydrothorax. She presents for paracentesis, thoracentesis and TIPS creation. EXAM: 1. Ultrasound-guided paracentesis 2. Ultrasound-guided right thoracentesis 3. Ultrasound-guided right common femoral venous access for introduction of intravascular ultrasound 4. TIPS creation MEDICATIONS: As antibiotic prophylaxis, 1 g Rocephin was ordered pre-procedure and administered intravenously within one hour of incision. ANESTHESIA/SEDATION: General - as administered by the Anesthesia department CONTRAST:  110 mL Omnipaque 300 FLUOROSCOPY TIME:  Radiation exposure index: 461 mGy reference air kerma COMPLICATIONS: None immediate. PROCEDURE: Informed written consent was obtained from the patient after a thorough discussion of the procedural risks, benefits and alternatives. All questions were addressed. Maximal Sterile Barrier Technique was utilized including caps, mask, sterile gowns, sterile gloves, sterile drape, hand hygiene and skin  antiseptic. A timeout was performed prior to the initiation of the procedure. PARACENTESIS The abdomen was interrogated with ultrasound. There is a large amount of free ascites. A suitable skin entry site was  selected and marked. The region was sterilely prepped and draped in the standard fashion using chlorhexidine skin prep. Local anesthesia was attained by infiltration with 1% lidocaine. A small dermatotomy was made. Under ultrasound guidance, a 6 French Safe-T-Centesis catheter was advanced into the fluid collection. Paracentesis was then performed yielding approximately 3.5 L of ascites. The catheter was removed and a bandage applied. THORACENTESIS The right chest was interrogated with ultrasound. There is a large right-sided pleural effusion. A suitable skin entry site was selected and marked. The region was sterilely prepped and draped in the standard fashion using chlorhexidine skin prep. Local anesthesia was attained by infiltration with 1% lidocaine. A small dermatotomy was made. Under ultrasound guidance, a 6 French Safe-T-Centesis catheter was advanced into the right pleural fluid. Thoracentesis was performed yielding 3 L of straw-colored pleural fluid. The catheter was removed. A bandage was applied. INTRAVASCULAR [place] The right common femoral vein was interrogated with ultrasound and found to be widely patent. An image was obtained and stored for the medical record. Local anesthesia was attained by infiltration with 1% lidocaine. A small dermatotomy was made. Under real-time sonographic guidance, the vessel was punctured with a 21 gauge micropuncture needle. Using standard technique, the initial micro needle was exchanged over a 0.018 micro wire for a transitional 4 [place] micro sheath. The micro sheath was then exchanged over a 0.035 wire for a 9 French vascular sheath. The 8 French intracardiac echocardiography (ICE) catheter was then advanced over a Bentson wire into the inferior cavoatrial junction. Intravascular ultrasound was then performed evaluating the supra hepatic and intra hepatic IVC, hepatic veins and portal veins. The portal veins are diminutive but patent. TIPS The right internal jugular vein  was interrogated with ultrasound and found to be widely patent. An image was obtained and stored for the medical record. Local anesthesia was attained by infiltration with 1% lidocaine. A small dermatotomy was made. Under real-time sonographic guidance, the vessel was punctured with a 21 gauge micropuncture needle. Using standard technique, the initial micro needle was exchanged over a 0.018 micro wire for a transitional 4 [place] micro sheath. The micro sheath was then exchanged over a 0.035 wire for a 5 French vascular sheath. Attention was turned to the right abdomen. The abdomen was interrogated with ultrasound. The liver is shrunken and extremely heterogeneous. The gallbladder is mildly distended. There was great difficulty in identifying the diminutive portal veins. Several small portal venous radicles were accessed, however the trajectory would not facilitate passage of a wire into the central portal venous system. Ultimately, using a combination of mid epigastric transabdominal ultrasound guidance, and a right lateral intra axillary needle approach, a 22 gauge needle was successfully advanced along a transhepatic course from lateral to medial and used to select a peripheral branch of the right portal vein. A 0.018 wire was navigated in the central portal veins. The needle was then exchanged and a 5 [place] Accustick sheath advanced over the needle and into the main portal vein. Transhepatic portal venography was then performed. The intrahepatic and main portal veins are markedly diminutive. The 5 French catheter is essentially occlusive in the right portal vein. There is marked hypertrophy of the posterior gastric vein filling a large portosystemic collateral. The Nitinol portion of the 0.018 wire was inserted with the tip at  the portal venous confluence to provide an intra portal marker. Attention was turned back to the 5 French sheath in the right internal jugular vein. The sheath was aspirated and flushed.  A wire was introduced and passed through the heart into the inferior vena cava. The 5 French sheath was removed. The skin tract was dilated to 10 [place] and a 9 [place] braided hydrophilic tips sheath advanced over the wire and positioned in the inferior vena cava. A 5 [place] MPA catheter was inserted coaxially through the sheath and the sheath was brought back into the right atrium. The catheter was then manipulated into the origin of the middle hepatic vein and the middle hepatic vein was selected over a Bentson wire. Once the catheter was in the middle hepatic vein, a hepatic venogram was performed confirming the venous anatomy. It is known that this represents the middle hepatic vein based on evaluation of prior CT imaging which demonstrates a diminutive and extremely posterior right hepatic vein. A superstiff Amplatz wire was advanced distally into the middle hepatic vein. The tips sheath was advanced over the wire into the middle hepatic vein. Initially, and are gone scorpion tips set was advanced over the wire, through the sheath and into the middle hepatic vein. And attempts at puncture was made, however purchase within the middle hepatic vein is tenuous and the scorpion needle could not be advanced through the hepatic parenchyma without buckling out of the middle hepatic vein. Therefore, the scorpion system was removed. The standard Ring set and Colopinto needle were next introduced. A pass was made with colo pinto needle. On lateral imaging, it is clear that the middle hepatic vein and the colo pinto needle are quite posterior with respect to the right portal vein. It is obvious that it will be extremely challenging to make this pass naturally. Given the very small size of the liver, and the tenuous purchase within the middle hepatic vein there is concern for complication with multiple needle passes. Therefore, the decision was made to proceed with gun site technique to facilitate tips creation. The tips  sheath was left in the middle hepatic vein but the colo pinto needle was removed. A 4 French 65 cm snare was advanced through the sheath and positioned in the middle hepatic vein. Similarly, a second 4 French 65 cm snare was advanced through the trans hepatic portal venous access and placed in the central right portal vein. The image intensifier was then angulated until the 2 snares overlapped. A 22 gauge 15 cm Chiba needle was then carefully advanced from the anterior abdominal wall using real-time fluoroscopic guidance through the liver and through both snares. A 300 cm glidewire advantage microwire was then advanced through the needle and the needle was retracted leaving the wire in place. The wire was then successfully snared with the snare located in the middle hepatic vein. The wire was brought back through the tips sheath and out the hub such that there was flossing from the anterior abdominal wall through the anterior liver, through the right portal vein, into the middle hepatic vein, up the sheath an out the IJ access. A coaxial system was then created using a 150 cm 0.014 CXI support catheter inside a 130 cm 0.035 CXI support catheter. The coaxial catheter system was then loaded over the 0.014 glidewire advantage catheter and advanced from the IJ access through the liver and out the anterior abdominal wall (only the 0.014 portion of the system was advanced through the anterior abdominal wall).  The 0.035 CXI catheter was position with the radiopaque marker in the region of the right portal vein as identified by the intra portal snare. While maintaining through and through wire access, the 0.014 CXI support catheter was removed. A second 014 glidewire advantage wire was then advanced coaxially through the 0.035 catheter and navigated into the portal vein and down the SMV. Next, the through and through flossing wire was carefully removed. The 0.014 CXI support catheter was reintroduced over the new Glidewire  Advantage wire coaxially through the 0.035 CXI support catheter. The catheter system was then carefully advanced through the access site in the right portal vein and into the central portal venous system. Contrast was injected confirming successful transhepatic portosystemic access. The 0.014 catheter and wire were removed. A superstiff Amplatz wire was advanced through the 035 catheter into the distal portal vein. The CXI support catheter was exchanged for a marker pigtail catheter. Portal venography was performed. Again, this confirms a large portosystemic collateral arising from the posterior gastric vein. Portal and systemic pressures were then measured. Portal pressure: 30 mm Hg Right atrial pressure: 10 mm Hg Portosystemic gradient: 20 mm Hg (severe portal hypertension) A 6 cm covered, 2 cm un covered Gore Viatorr stent graft was then selected. The superstiff Amplatz wire was advanced through the marker pigtail catheter the pigtail catheter removed. The transhepatic tract was balloon dilated to 6 mm using a 6 x 40 mm mustang balloon. The tips sheath was then advanced over the balloon and into the main portal vein. At this time, maneuvers were performed in order to remove the snare around the sheath from the transhepatic portal venous access. To accomplish this, the tips sheath was pulled back to just within the transhepatic tract. The snare was closed around the existing superstiff Amplatz wire. A 5 French angled catheter and Glidewire were advanced coaxially through the tips sheath in the Glidewire and catheter system carefully passed into the main portal vein external to the closed snare. Portal venous access was confirmed by aspiration of blood and catheter injection. The glidewire was exchanged for a superstiff Amplatz wire. The initial superstiff Amplatz wire was then released from the snare and removed. This allowed successful capture and removal of the snare device. The tips sheath was readvanced over  the superstiff Amplatz wire. The Viatorr stent graft was introduced through the sheath and the metal portion of the stent graph un covered. This deployed nicely within the main portal vein. The stent graft was then pulled back in till it was snug against the right portal venous entry site. The remainder of the stent graft was un sheath and then deployed. The stent graft was post dilated to 8 mm using an 8 x 40 mm Conquest balloon. The balloon was exchanged for the pigtail catheter which was reintroduced into the main portal vein. Repeat portal venography was performed. There is preferential flow through the newly created tips. The stent graft is widely patent and flow is brisk. There is decreased flow through the large portal systemic collateral system. Pressures were remeasured. Portal pressure: 23 mm Hg Right atrial pressure: 18 mm Hg Portosystemic gradient: 5 mm Hg (normal portal venous pressure). At this time, some consideration was made for proceeding with embolization of the hypertrophic posterior gastric vein and associated shunt system. However, flow appears to be preferential through the tips. The decision was made to defer embolization at this time. This could be embolized in the future if it becomes clinically symptomatic. The tips sheath  was removed and hemostasis attained by manual pressure. The intravascular ultrasound device was removed and the right common femoral sheath removed and hemostasis attained by manual pressure. The transhepatic portal venous access sheath was removed. Bandages were applied all 3 sites. The patient tolerated the procedure well and was taken to PACU in stable condition. IMPRESSION: 1. Paracentesis yielding 3.5 L ascites. 2. Right thoracentesis yielding 3 L pleural fluid. 3. Successful creation of a right middle hepatic to right portal venous tips utilizing a 6 cm covered 2 cm uncovered Gore Viatorr stent graft post dilated to 8 mm. 4. Pre TIPS portosystemic gradient of 20  mmHg consistent with severe portal hypertension. Post TIPS portosystemic gradient of 5 mm Hg which is within normal limits. Signed, Criselda Peaches, MD, [place] [facility] Radiology Electronically Signed   By: Jacqulynn Cadet M.D.   On: 11/02/2021 10:01   IR US Guide Vasc Access Right  Result Date: 11/02/2021 CLINICAL DATA:  56 year old female with a history of alcoholic cirrhosis complicated by recurrent large volume ascites and hepatic hydrothorax. She presents for paracentesis, thoracentesis and TIPS creation. EXAM: 1. Ultrasound-guided paracentesis 2. Ultrasound-guided right thoracentesis 3. Ultrasound-guided right common femoral venous access for introduction of intravascular ultrasound 4. TIPS creation MEDICATIONS: As antibiotic prophylaxis, 1 g Rocephin was ordered pre-procedure and administered intravenously within one hour of incision. ANESTHESIA/SEDATION: General - as administered by the Anesthesia department CONTRAST:  110 mL Omnipaque 300 FLUOROSCOPY TIME:  Radiation exposure index: 461 mGy reference air kerma COMPLICATIONS: None immediate. PROCEDURE: Informed written consent was obtained from the patient after a thorough discussion of the procedural risks, benefits and alternatives. All questions were addressed. Maximal Sterile Barrier Technique was utilized including caps, mask, sterile gowns, sterile gloves, sterile drape, hand hygiene and skin antiseptic. A timeout was performed prior to the initiation of the procedure. PARACENTESIS The abdomen was interrogated with ultrasound. There is a large amount of free ascites. A suitable skin entry site was selected and marked. The region was sterilely prepped and draped in the standard fashion using chlorhexidine skin prep. Local anesthesia was attained by infiltration with 1% lidocaine. A small dermatotomy was made. Under ultrasound guidance, a 6 French Safe-T-Centesis catheter was advanced into the  fluid collection. Paracentesis was then performed yielding approximately 3.5 L of ascites. The catheter was removed and a bandage applied. THORACENTESIS The right chest was interrogated with ultrasound. There is a large right-sided pleural effusion. A suitable skin entry site was selected and marked. The region was sterilely prepped and draped in the standard fashion using chlorhexidine skin prep. Local anesthesia was attained by infiltration with 1% lidocaine. A small dermatotomy was made. Under ultrasound guidance, a 6 French Safe-T-Centesis catheter was advanced into the right pleural fluid. Thoracentesis was performed yielding 3 L of straw-colored pleural fluid. The catheter was removed. A bandage was applied. INTRAVASCULAR [place] The right common femoral vein was interrogated with ultrasound and found to be widely patent. An image was obtained and stored for the medical record. Local anesthesia was attained by infiltration with 1% lidocaine. A small dermatotomy was made. Under real-time sonographic guidance, the vessel was punctured with a 21 gauge micropuncture needle. Using standard technique, the initial micro needle was exchanged over a 0.018 micro wire for a transitional 4 [place] micro sheath. The micro sheath was then exchanged over a 0.035 wire for a 9 French vascular sheath. The 8 French intracardiac echocardiography (ICE) catheter was then advanced over a Bentson wire into the inferior  cavoatrial junction. Intravascular ultrasound was then performed evaluating the supra hepatic and intra hepatic IVC, hepatic veins and portal veins. The portal veins are diminutive but patent. TIPS The right internal jugular vein was interrogated with ultrasound and found to be widely patent. An image was obtained and stored for the medical record. Local anesthesia was attained by infiltration with 1% lidocaine. A small dermatotomy was made. Under real-time sonographic guidance, the vessel was punctured with a 21 gauge  micropuncture needle. Using standard technique, the initial micro needle was exchanged over a 0.018 micro wire for a transitional 4 [place] micro sheath. The micro sheath was then exchanged over a 0.035 wire for a 5 French vascular sheath. Attention was turned to the right abdomen. The abdomen was interrogated with ultrasound. The liver is shrunken and extremely heterogeneous. The gallbladder is mildly distended. There was great difficulty in identifying the diminutive portal veins. Several small portal venous radicles were accessed, however the trajectory would not facilitate passage of a wire into the central portal venous system. Ultimately, using a combination of mid epigastric transabdominal ultrasound guidance, and a right lateral intra axillary needle approach, a 22 gauge needle was successfully advanced along a transhepatic course from lateral to medial and used to select a peripheral branch of the right portal vein. A 0.018 wire was navigated in the central portal veins. The needle was then exchanged and a 5 [place] Accustick sheath advanced over the needle and into the main portal vein. Transhepatic portal venography was then performed. The intrahepatic and main portal veins are markedly diminutive. The 5 French catheter is essentially occlusive in the right portal vein. There is marked hypertrophy of the posterior gastric vein filling a large portosystemic collateral. The Nitinol portion of the 0.018 wire was inserted with the tip at the portal venous confluence to provide an intra portal marker. Attention was turned back to the 5 French sheath in the right internal jugular vein. The sheath was aspirated and flushed. A wire was introduced and passed through the heart into the inferior vena cava. The 5 French sheath was removed. The skin tract was dilated to 10 [place] and a 9 [place] braided hydrophilic tips sheath advanced over the wire and positioned in the inferior vena cava. A 5 [place] MPA catheter was  inserted coaxially through the sheath and the sheath was brought back into the right atrium. The catheter was then manipulated into the origin of the middle hepatic vein and the middle hepatic vein was selected over a Bentson wire. Once the catheter was in the middle hepatic vein, a hepatic venogram was performed confirming the venous anatomy. It is known that this represents the middle hepatic vein based on evaluation of prior CT imaging which demonstrates a diminutive and extremely posterior right hepatic vein. A superstiff Amplatz wire was advanced distally into the middle hepatic vein. The tips sheath was advanced over the wire into the middle hepatic vein. Initially, and are gone scorpion tips set was advanced over the wire, through the sheath and into the middle hepatic vein. And attempts at puncture was made, however purchase within the middle hepatic vein is tenuous and the scorpion needle could not be advanced through the hepatic parenchyma without buckling out of the middle hepatic vein. Therefore, the scorpion system was removed. The standard Ring set and Colopinto needle were next introduced. A pass was made with colo pinto needle. On lateral imaging, it is clear that the middle hepatic vein and the colo pinto needle are quite posterior  with respect to the right portal vein. It is obvious that it will be extremely challenging to make this pass naturally. Given the very small size of the liver, and the tenuous purchase within the middle hepatic vein there is concern for complication with multiple needle passes. Therefore, the decision was made to proceed with gun site technique to facilitate tips creation. The tips sheath was left in the middle hepatic vein but the colo pinto needle was removed. A 4 French 65 cm snare was advanced through the sheath and positioned in the middle hepatic vein. Similarly, a second 4 French 65 cm snare was advanced through the trans hepatic portal venous access and placed in  the central right portal vein. The image intensifier was then angulated until the 2 snares overlapped. A 22 gauge 15 cm Chiba needle was then carefully advanced from the anterior abdominal wall using real-time fluoroscopic guidance through the liver and through both snares. A 300 cm glidewire advantage microwire was then advanced through the needle and the needle was retracted leaving the wire in place. The wire was then successfully snared with the snare located in the middle hepatic vein. The wire was brought back through the tips sheath and out the hub such that there was flossing from the anterior abdominal wall through the anterior liver, through the right portal vein, into the middle hepatic vein, up the sheath an out the IJ access. A coaxial system was then created using a 150 cm 0.014 CXI support catheter inside a 130 cm 0.035 CXI support catheter. The coaxial catheter system was then loaded over the 0.014 glidewire advantage catheter and advanced from the IJ access through the liver and out the anterior abdominal wall (only the 0.014 portion of the system was advanced through the anterior abdominal wall). The 0.035 CXI catheter was position with the radiopaque marker in the region of the right portal vein as identified by the intra portal snare. While maintaining through and through wire access, the 0.014 CXI support catheter was removed. A second 014 glidewire advantage wire was then advanced coaxially through the 0.035 catheter and navigated into the portal vein and down the SMV. Next, the through and through flossing wire was carefully removed. The 0.014 CXI support catheter was reintroduced over the new Glidewire Advantage wire coaxially through the 0.035 CXI support catheter. The catheter system was then carefully advanced through the access site in the right portal vein and into the central portal venous system. Contrast was injected confirming successful transhepatic portosystemic access. The 0.014  catheter and wire were removed. A superstiff Amplatz wire was advanced through the 035 catheter into the distal portal vein. The CXI support catheter was exchanged for a marker pigtail catheter. Portal venography was performed. Again, this confirms a large portosystemic collateral arising from the posterior gastric vein. Portal and systemic pressures were then measured. Portal pressure: 30 mm Hg Right atrial pressure: 10 mm Hg Portosystemic gradient: 20 mm Hg (severe portal hypertension) A 6 cm covered, 2 cm un covered Gore Viatorr stent graft was then selected. The superstiff Amplatz wire was advanced through the marker pigtail catheter the pigtail catheter removed. The transhepatic tract was balloon dilated to 6 mm using a 6 x 40 mm mustang balloon. The tips sheath was then advanced over the balloon and into the main portal vein. At this time, maneuvers were performed in order to remove the snare around the sheath from the transhepatic portal venous access. To accomplish this, the tips sheath was pulled back  to just within the transhepatic tract. The snare was closed around the existing superstiff Amplatz wire. A 5 French angled catheter and Glidewire were advanced coaxially through the tips sheath in the Glidewire and catheter system carefully passed into the main portal vein external to the closed snare. Portal venous access was confirmed by aspiration of blood and catheter injection. The glidewire was exchanged for a superstiff Amplatz wire. The initial superstiff Amplatz wire was then released from the snare and removed. This allowed successful capture and removal of the snare device. The tips sheath was readvanced over the superstiff Amplatz wire. The Viatorr stent graft was introduced through the sheath and the metal portion of the stent graph un covered. This deployed nicely within the main portal vein. The stent graft was then pulled back in till it was snug against the right portal venous entry site. The  remainder of the stent graft was un sheath and then deployed. The stent graft was post dilated to 8 mm using an 8 x 40 mm Conquest balloon. The balloon was exchanged for the pigtail catheter which was reintroduced into the main portal vein. Repeat portal venography was performed. There is preferential flow through the newly created tips. The stent graft is widely patent and flow is brisk. There is decreased flow through the large portal systemic collateral system. Pressures were remeasured. Portal pressure: 23 mm Hg Right atrial pressure: 18 mm Hg Portosystemic gradient: 5 mm Hg (normal portal venous pressure). At this time, some consideration was made for proceeding with embolization of the hypertrophic posterior gastric vein and associated shunt system. However, flow appears to be preferential through the tips. The decision was made to defer embolization at this time. This could be embolized in the future if it becomes clinically symptomatic. The tips sheath was removed and hemostasis attained by manual pressure. The intravascular ultrasound device was removed and the right common femoral sheath removed and hemostasis attained by manual pressure. The transhepatic portal venous access sheath was removed. Bandages were applied all 3 sites. The patient tolerated the procedure well and was taken to PACU in stable condition. IMPRESSION: 1. Paracentesis yielding 3.5 L ascites. 2. Right thoracentesis yielding 3 L pleural fluid. 3. Successful creation of a right middle hepatic to right portal venous tips utilizing a 6 cm covered 2 cm uncovered Gore Viatorr stent graft post dilated to 8 mm. 4. Pre TIPS portosystemic gradient of 20 mmHg consistent with severe portal hypertension. Post TIPS portosystemic gradient of 5 mm Hg which is within normal limits. Signed, Criselda Peaches, MD, [facility] Radiology Electronically Signed   By: Jacqulynn Cadet M.D.   On:  11/02/2021 10:01   IR US Guide Vasc Access Right  Result Date: 11/02/2021 CLINICAL DATA:  56 year old female with a history of alcoholic cirrhosis complicated by recurrent large volume ascites and hepatic hydrothorax. She presents for paracentesis, thoracentesis and TIPS creation. EXAM: 1. Ultrasound-guided paracentesis 2. Ultrasound-guided right thoracentesis 3. Ultrasound-guided right common femoral venous access for introduction of intravascular ultrasound 4. TIPS creation MEDICATIONS: As antibiotic prophylaxis, 1 g Rocephin was ordered pre-procedure and administered intravenously within one hour of incision. ANESTHESIA/SEDATION: General - as administered by the Anesthesia department CONTRAST:  110 mL Omnipaque 300 FLUOROSCOPY TIME:  Radiation exposure index: 461 mGy reference air kerma COMPLICATIONS: None immediate. PROCEDURE: Informed written consent was obtained from the patient after a thorough discussion of the procedural risks, benefits and alternatives. All questions were addressed. Maximal Sterile Barrier Technique was  utilized including caps, mask, sterile gowns, sterile gloves, sterile drape, hand hygiene and skin antiseptic. A timeout was performed prior to the initiation of the procedure. PARACENTESIS The abdomen was interrogated with ultrasound. There is a large amount of free ascites. A suitable skin entry site was selected and marked. The region was sterilely prepped and draped in the standard fashion using chlorhexidine skin prep. Local anesthesia was attained by infiltration with 1% lidocaine. A small dermatotomy was made. Under ultrasound guidance, a 6 French Safe-T-Centesis catheter was advanced into the fluid collection. Paracentesis was then performed yielding approximately 3.5 L of ascites. The catheter was removed and a bandage applied. THORACENTESIS The right chest was interrogated with ultrasound. There is a large right-sided pleural effusion. A suitable skin entry site was selected  and marked. The region was sterilely prepped and draped in the standard fashion using chlorhexidine skin prep. Local anesthesia was attained by infiltration with 1% lidocaine. A small dermatotomy was made. Under ultrasound guidance, a 6 French Safe-T-Centesis catheter was advanced into the right pleural fluid. Thoracentesis was performed yielding 3 L of straw-colored pleural fluid. The catheter was removed. A bandage was applied. INTRAVASCULAR [place] The right common femoral vein was interrogated with ultrasound and found to be widely patent. An image was obtained and stored for the medical record. Local anesthesia was attained by infiltration with 1% lidocaine. A small dermatotomy was made. Under real-time sonographic guidance, the vessel was punctured with a 21 gauge micropuncture needle. Using standard technique, the initial micro needle was exchanged over a 0.018 micro wire for a transitional 4 [place] micro sheath. The micro sheath was then exchanged over a 0.035 wire for a 9 French vascular sheath. The 8 French intracardiac echocardiography (ICE) catheter was then advanced over a Bentson wire into the inferior cavoatrial junction. Intravascular ultrasound was then performed evaluating the supra hepatic and intra hepatic IVC, hepatic veins and portal veins. The portal veins are diminutive but patent. TIPS The right internal jugular vein was interrogated with ultrasound and found to be widely patent. An image was obtained and stored for the medical record. Local anesthesia was attained by infiltration with 1% lidocaine. A small dermatotomy was made. Under real-time sonographic guidance, the vessel was punctured with a 21 gauge micropuncture needle. Using standard technique, the initial micro needle was exchanged over a 0.018 micro wire for a transitional 4 [place] micro sheath. The micro sheath was then exchanged over a 0.035 wire for a 5 French vascular sheath. Attention was turned to the right abdomen. The abdomen  was interrogated with ultrasound. The liver is shrunken and extremely heterogeneous. The gallbladder is mildly distended. There was great difficulty in identifying the diminutive portal veins. Several small portal venous radicles were accessed, however the trajectory would not facilitate passage of a wire into the central portal venous system. Ultimately, using a combination of mid epigastric transabdominal ultrasound guidance, and a right lateral intra axillary needle approach, a 22 gauge needle was successfully advanced along a transhepatic course from lateral to medial and used to select a peripheral branch of the right portal vein. A 0.018 wire was navigated in the central portal veins. The needle was then exchanged and a 5 [place] Accustick sheath advanced over the needle and into the main portal vein. Transhepatic portal venography was then performed. The intrahepatic and main portal veins are markedly diminutive. The 5 French catheter is essentially occlusive in the right portal vein. There is marked hypertrophy of the posterior gastric vein filling a large portosystemic  collateral. The Nitinol portion of the 0.018 wire was inserted with the tip at the portal venous confluence to provide an intra portal marker. Attention was turned back to the 5 French sheath in the right internal jugular vein. The sheath was aspirated and flushed. A wire was introduced and passed through the heart into the inferior vena cava. The 5 French sheath was removed. The skin tract was dilated to 10 [place] and a 9 [place] braided hydrophilic tips sheath advanced over the wire and positioned in the inferior vena cava. A 5 [place] MPA catheter was inserted coaxially through the sheath and the sheath was brought back into the right atrium. The catheter was then manipulated into the origin of the middle hepatic vein and the middle hepatic vein was selected over a Bentson wire. Once the catheter was in the middle hepatic vein, a hepatic  venogram was performed confirming the venous anatomy. It is known that this represents the middle hepatic vein based on evaluation of prior CT imaging which demonstrates a diminutive and extremely posterior right hepatic vein. A superstiff Amplatz wire was advanced distally into the middle hepatic vein. The tips sheath was advanced over the wire into the middle hepatic vein. Initially, and are gone scorpion tips set was advanced over the wire, through the sheath and into the middle hepatic vein. And attempts at puncture was made, however purchase within the middle hepatic vein is tenuous and the scorpion needle could not be advanced through the hepatic parenchyma without buckling out of the middle hepatic vein. Therefore, the scorpion system was removed. The standard Ring set and Colopinto needle were next introduced. A pass was made with colo pinto needle. On lateral imaging, it is clear that the middle hepatic vein and the colo pinto needle are quite posterior with respect to the right portal vein. It is obvious that it will be extremely challenging to make this pass naturally. Given the very small size of the liver, and the tenuous purchase within the middle hepatic vein there is concern for complication with multiple needle passes. Therefore, the decision was made to proceed with gun site technique to facilitate tips creation. The tips sheath was left in the middle hepatic vein but the colo pinto needle was removed. A 4 French 65 cm snare was advanced through the sheath and positioned in the middle hepatic vein. Similarly, a second 4 French 65 cm snare was advanced through the trans hepatic portal venous access and placed in the central right portal vein. The image intensifier was then angulated until the 2 snares overlapped. A 22 gauge 15 cm Chiba needle was then carefully advanced from the anterior abdominal wall using real-time fluoroscopic guidance through the liver and through both snares. A 300 cm  glidewire advantage microwire was then advanced through the needle and the needle was retracted leaving the wire in place. The wire was then successfully snared with the snare located in the middle hepatic vein. The wire was brought back through the tips sheath and out the hub such that there was flossing from the anterior abdominal wall through the anterior liver, through the right portal vein, into the middle hepatic vein, up the sheath an out the IJ access. A coaxial system was then created using a 150 cm 0.014 CXI support catheter inside a 130 cm 0.035 CXI support catheter. The coaxial catheter system was then loaded over the 0.014 glidewire advantage catheter and advanced from the IJ access through the liver and out the anterior abdominal wall (  only the 0.014 portion of the system was advanced through the anterior abdominal wall). The 0.035 CXI catheter was position with the radiopaque marker in the region of the right portal vein as identified by the intra portal snare. While maintaining through and through wire access, the 0.014 CXI support catheter was removed. A second 014 glidewire advantage wire was then advanced coaxially through the 0.035 catheter and navigated into the portal vein and down the SMV. Next, the through and through flossing wire was carefully removed. The 0.014 CXI support catheter was reintroduced over the new Glidewire Advantage wire coaxially through the 0.035 CXI support catheter. The catheter system was then carefully advanced through the access site in the right portal vein and into the central portal venous system. Contrast was injected confirming successful transhepatic portosystemic access. The 0.014 catheter and wire were removed. A superstiff Amplatz wire was advanced through the 035 catheter into the distal portal vein. The CXI support catheter was exchanged for a marker pigtail catheter. Portal venography was performed. Again, this confirms a large portosystemic collateral  arising from the posterior gastric vein. Portal and systemic pressures were then measured. Portal pressure: 30 mm Hg Right atrial pressure: 10 mm Hg Portosystemic gradient: 20 mm Hg (severe portal hypertension) A 6 cm covered, 2 cm un covered Gore Viatorr stent graft was then selected. The superstiff Amplatz wire was advanced through the marker pigtail catheter the pigtail catheter removed. The transhepatic tract was balloon dilated to 6 mm using a 6 x 40 mm mustang balloon. The tips sheath was then advanced over the balloon and into the main portal vein. At this time, maneuvers were performed in order to remove the snare around the sheath from the transhepatic portal venous access. To accomplish this, the tips sheath was pulled back to just within the transhepatic tract. The snare was closed around the existing superstiff Amplatz wire. A 5 French angled catheter and Glidewire were advanced coaxially through the tips sheath in the Glidewire and catheter system carefully passed into the main portal vein external to the closed snare. Portal venous access was confirmed by aspiration of blood and catheter injection. The glidewire was exchanged for a superstiff Amplatz wire. The initial superstiff Amplatz wire was then released from the snare and removed. This allowed successful capture and removal of the snare device. The tips sheath was readvanced over the superstiff Amplatz wire. The Viatorr stent graft was introduced through the sheath and the metal portion of the stent graph un covered. This deployed nicely within the main portal vein. The stent graft was then pulled back in till it was snug against the right portal venous entry site. The remainder of the stent graft was un sheath and then deployed. The stent graft was post dilated to 8 mm using an 8 x 40 mm Conquest balloon. The balloon was exchanged for the pigtail catheter which was reintroduced into the main portal vein. Repeat portal venography was performed.  There is preferential flow through the newly created tips. The stent graft is widely patent and flow is brisk. There is decreased flow through the large portal systemic collateral system. Pressures were remeasured. Portal pressure: 23 mm Hg Right atrial pressure: 18 mm Hg Portosystemic gradient: 5 mm Hg (normal portal venous pressure). At this time, some consideration was made for proceeding with embolization of the hypertrophic posterior gastric vein and associated shunt system. However, flow appears to be preferential through the tips. The decision was made to defer embolization at this time. This  could be embolized in the future if it becomes clinically symptomatic. The tips sheath was removed and hemostasis attained by manual pressure. The intravascular ultrasound device was removed and the right common femoral sheath removed and hemostasis attained by manual pressure. The transhepatic portal venous access sheath was removed. Bandages were applied all 3 sites. The patient tolerated the procedure well and was taken to PACU in stable condition. IMPRESSION: 1. Paracentesis yielding 3.5 L ascites. 2. Right thoracentesis yielding 3 L pleural fluid. 3. Successful creation of a right middle hepatic to right portal venous tips utilizing a 6 cm covered 2 cm uncovered [place] Viatorr stent graft post dilated to 8 mm. 4. Pre TIPS portosystemic gradient of 20 mmHg consistent with severe portal hypertension. Post TIPS portosystemic gradient of 5 mm Hg which is within normal limits. Signed, Criselda Peaches, MD, [facility] Radiology Electronically Signed   By: Jacqulynn Cadet M.D.   On: 11/02/2021 10:01   DG CHEST PORT 1 VIEW  Result Date: 11/08/2021 CLINICAL DATA:  Shortness of breath EXAM: PORTABLE CHEST 1 VIEW COMPARISON:  Previous day FINDINGS: Cardiomediastinal silhouette is within normal limits. Small right pleural effusion. No pneumothorax. No lobar  consolidation. A TIPS stent is present. IMPRESSION: Small right pleural effusion. Electronically Signed   By: Albin Felling M.D.   On: 11/08/2021 08:54   DG Chest Port 1 View  Result Date: 11/07/2021 CLINICAL DATA:  Chest tube removal EXAM: PORTABLE CHEST 1 VIEW COMPARISON:  11/07/2021 1:53 p.m. FINDINGS: Previously noted right pleural pigtail catheter is no longer seen. Possible trace right pneumothorax. Right basilar atelectasis. No new focal pulmonary opacity. No pleural effusion. No acute osseous abnormality. Normal cardiac and mediastinal contours. IMPRESSION: Interval removal of right pleural pigtail catheter, with possible trace right pneumothorax. Electronically Signed   By: Merilyn Baba M.D.   On: 11/07/2021 17:29   DG CHEST PORT 1 VIEW  Result Date: 11/07/2021 CLINICAL DATA:  Follow-up pneumothorax. EXAM: PORTABLE CHEST 1 VIEW COMPARISON:  Multiple previous chest x-rays. FINDINGS: The right-sided chest tube is stable. There is a very small residual right apical pneumothorax. Streaky right basilar atelectasis. The left lung remains clear. IMPRESSION: Very small residual right apical pneumothorax. Electronically Signed   By: Marijo Sanes M.D.   On: 11/07/2021 14:03   DG Chest Port 1 View  Result Date: 11/07/2021 CLINICAL DATA:  Chest tube placement.  Shortness of breath. EXAM: PORTABLE CHEST 1 VIEW COMPARISON:  Chest radiograph 11/06/2021 FINDINGS: The cardiomediastinal silhouette is unchanged with normal heart size. A right-sided chest tube remains in place. There is a persistent trace right apical pneumothorax. Mild right basilar opacity has slightly increased and likely reflects atelectasis. No sizable pleural effusion is evident. IMPRESSION: Persistent trace right apical pneumothorax with chest tube in place. Mild right basilar atelectasis. Electronically Signed   By: Logan Bores M.D.   On: 11/07/2021 08:31   DG Chest Port 1 View  Result Date: 11/06/2021 CLINICAL DATA:  Pneumothorax  with right chest tube in place. EXAM: PORTABLE CHEST 1 VIEW COMPARISON:  Portable chest yesterday at 1:21 p.m. FINDINGS: 5:11 a.m., 11/06/2021. Pigtail right chest pleural tube remains in place with pigtail in the lateral aspect of the mid chest. Small right pneumothorax with apical and lateral basal components appears improved, estimated 5% or less volume. The lungs are clear. No pleural effusion is seen. The cardiac size and mediastinal silhouette are normal. No acute osseous abnormalities. IMPRESSION: Small apical/lateral basal right pneumothorax is unchanged.  Chest tube positioning is unchanged. No other evidence of acute chest process. Electronically Signed   By: Telford Nab M.D.   On: 11/06/2021 06:40   DG Chest Port 1 View  Result Date: 11/05/2021 CLINICAL DATA:  Follow-up right pneumothorax. EXAM: PORTABLE CHEST 1 VIEW COMPARISON:  Prior today FINDINGS: Right pleural pigtail catheter remains in place. A small right pneumothorax with both apical and basilar components appears mildly increased in size since prior study today. Both lungs are clear. Heart size is normal. IMPRESSION: Mild increase in size of small right pneumothorax. Electronically Signed   By: Marlaine Hind M.D.   On: 11/05/2021 13:42   DG Chest Port 1 View  Result Date: 11/05/2021 CLINICAL DATA:  Chest tube placement for right pneumothorax EXAM: PORTABLE CHEST 1 VIEW COMPARISON:  Chest radiograph from one day prior. FINDINGS: Right chest tube terminates over the peripheral right mid pleural space. Stable cardiomediastinal silhouette with normal heart size. Tiny residual apical right pneumothorax, less than 5%, substantially decreased. No left pneumothorax. No pleural effusion. Improved right lung aeration with residual mild right basilar atelectasis. Otherwise clear lungs, with no pulmonary edema. IMPRESSION: 1. Tiny residual apical right pneumothorax, less than 5%, substantially decreased. Right chest tube in place. 2. Improved right  lung aeration with residual mild right basilar atelectasis. Electronically Signed   By: Ilona Sorrel M.D.   On: 11/05/2021 10:35   DG Chest Port 1 View  Result Date: 11/04/2021 CLINICAL DATA:  Shortness of breath.  Pneumothorax. EXAM: PORTABLE CHEST 1 VIEW COMPARISON:  Chest x-ray Nov 03, 2021. FINDINGS: Continued interval increase in size of the right hydropneumothorax, now measuring up to approximately 2.8 cm (previously 1.8 cm). No appreciable mediastinal shift. Right basilar opacities, probably atelectasis. Left lung is clear. Cardiomediastinal silhouette is similar to prior and within normal limits. IMPRESSION: 1. Continued interval increase in size of the right hydropneumothorax, now measuring up to approximately 2.8 cm (previously 1.8 cm). 2. Right basilar opacities, probably atelectasis. These results will be called to the ordering clinician or representative by the Radiologist Assistant, and communication documented in the PACS or [employer]. Electronically Signed   By: Margaretha Sheffield M.D.   On: 11/04/2021 07:39   DG Chest Port 1 View  Result Date: 11/03/2021 CLINICAL DATA:  Pneumothorax EXAM: PORTABLE CHEST 1 VIEW COMPARISON:  11/02/2021 FINDINGS: Slight interval increase in a right-sided hydropneumothorax, now approximately 25% in volume. The left lung is normally aerated. Heart and mediastinum are normal. IMPRESSION: Slight interval increase in a right-sided hydropneumothorax, now approximately 25% in volume. The left lung is normally aerated. These results will be called to the ordering clinician or representative by the Radiologist Assistant, and communication documented in the PACS or [employer]. Electronically Signed   By: Delanna Ahmadi M.D.   On: 11/03/2021 09:41   DG Chest Port 1 View  Result Date: 11/02/2021 CLINICAL DATA:  56 year old female with a history of alcoholic cirrhosis complicated by recurrent large volume ascites and hepatic hydrothorax. She presents for  paracentesis, thoracentesis and TIPS creation. EXAM: 1. Ultrasound-guided paracentesis 2. Ultrasound-guided right thoracentesis 3. Ultrasound-guided right common femoral venous access for introduction of intravascular ultrasound 4. TIPS creation MEDICATIONS: As antibiotic prophylaxis, 1 g Rocephin was ordered pre-procedure and administered intravenously within one hour of incision. ANESTHESIA/SEDATION: General - as administered by the Anesthesia department CONTRAST:  110 mL Omnipaque 300 FLUOROSCOPY TIME:  Radiation exposure index: 461 mGy reference air kerma COMPLICATIONS: None immediate. PROCEDURE: Informed written consent was obtained from the patient  after a thorough discussion of the procedural risks, benefits and alternatives. All questions were addressed. Maximal Sterile Barrier Technique was utilized including caps, mask, sterile gowns, sterile gloves, sterile drape, hand hygiene and skin antiseptic. A timeout was performed prior to the initiation of the procedure. PARACENTESIS The abdomen was interrogated with ultrasound. There is a large amount of free ascites. A suitable skin entry site was selected and marked. The region was sterilely prepped and draped in the standard fashion using chlorhexidine skin prep. Local anesthesia was attained by infiltration with 1% lidocaine. A small dermatotomy was made. Under ultrasound guidance, a 6 French Safe-T-Centesis catheter was advanced into the fluid collection. Paracentesis was then performed yielding approximately 3.5 L of ascites. The catheter was removed and a bandage applied. THORACENTESIS The right chest was interrogated with ultrasound. There is a large right-sided pleural effusion. A suitable skin entry site was selected and marked. The region was sterilely prepped and draped in the standard fashion using chlorhexidine skin prep. Local anesthesia was attained by infiltration with 1% lidocaine. A small dermatotomy was made. Under ultrasound guidance, a 6  French Safe-T-Centesis catheter was advanced into the right pleural fluid. Thoracentesis was performed yielding 3 L of straw-colored pleural fluid. The catheter was removed. A bandage was applied. INTRAVASCULAR [place] The right common femoral vein was interrogated with ultrasound and found to be widely patent. An image was obtained and stored for the medical record. Local anesthesia was attained by infiltration with 1% lidocaine. A small dermatotomy was made. Under real-time sonographic guidance, the vessel was punctured with a 21 gauge micropuncture needle. Using standard technique, the initial micro needle was exchanged over a 0.018 micro wire for a transitional 4 [place] micro sheath. The micro sheath was then exchanged over a 0.035 wire for a 9 French vascular sheath. The 8 French intracardiac echocardiography (ICE) catheter was then advanced over a Bentson wire into the inferior cavoatrial junction. Intravascular ultrasound was then performed evaluating the supra hepatic and intra hepatic IVC, hepatic veins and portal veins. The portal veins are diminutive but patent. TIPS The right internal jugular vein was interrogated with ultrasound and found to be widely patent. An image was obtained and stored for the medical record. Local anesthesia was attained by infiltration with 1% lidocaine. A small dermatotomy was made. Under real-time sonographic guidance, the vessel was punctured with a 21 gauge micropuncture needle. Using standard technique, the initial micro needle was exchanged over a 0.018 micro wire for a transitional 4 [place] micro sheath. The micro sheath was then exchanged over a 0.035 wire for a 5 French vascular sheath. Attention was turned to the right abdomen. The abdomen was interrogated with ultrasound. The liver is shrunken and extremely heterogeneous. The gallbladder is mildly distended. There was great difficulty in identifying the diminutive portal veins. Several small portal venous radicles were  accessed, however the trajectory would not facilitate passage of a wire into the central portal venous system. Ultimately, using a combination of mid epigastric transabdominal ultrasound guidance, and a right lateral intra axillary needle approach, a 22 gauge needle was successfully advanced along a transhepatic course from lateral to medial and used to select a peripheral branch of the right portal vein. A 0.018 wire was navigated in the central portal veins. The needle was then exchanged and a 5 [place] Accustick sheath advanced over the needle and into the main portal vein. Transhepatic portal venography was then performed. The intrahepatic and main portal veins are markedly diminutive. The 5 French catheter is  essentially occlusive in the right portal vein. There is marked hypertrophy of the posterior gastric vein filling a large portosystemic collateral. The Nitinol portion of the 0.018 wire was inserted with the tip at the portal venous confluence to provide an intra portal marker. Attention was turned back to the 5 French sheath in the right internal jugular vein. The sheath was aspirated and flushed. A wire was introduced and passed through the heart into the inferior vena cava. The 5 French sheath was removed. The skin tract was dilated to 10 [place] and a 9 [place] braided hydrophilic tips sheath advanced over the wire and positioned in the inferior vena cava. A 5 [place] MPA catheter was inserted coaxially through the sheath and the sheath was brought back into the right atrium. The catheter was then manipulated into the origin of the middle hepatic vein and the middle hepatic vein was selected over a Bentson wire. Once the catheter was in the middle hepatic vein, a hepatic venogram was performed confirming the venous anatomy. It is known that this represents the middle hepatic vein based on evaluation of prior CT imaging which demonstrates a diminutive and extremely posterior right hepatic vein. A superstiff  Amplatz wire was advanced distally into the middle hepatic vein. The tips sheath was advanced over the wire into the middle hepatic vein. Initially, and are gone scorpion tips set was advanced over the wire, through the sheath and into the middle hepatic vein. And attempts at puncture was made, however purchase within the middle hepatic vein is tenuous and the scorpion needle could not be advanced through the hepatic parenchyma without buckling out of the middle hepatic vein. Therefore, the scorpion system was removed. The standard Ring set and Colopinto needle were next introduced. A pass was made with colo pinto needle. On lateral imaging, it is clear that the middle hepatic vein and the colo pinto needle are quite posterior with respect to the right portal vein. It is obvious that it will be extremely challenging to make this pass naturally. Given the very small size of the liver, and the tenuous purchase within the middle hepatic vein there is concern for complication with multiple needle passes. Therefore, the decision was made to proceed with gun site technique to facilitate tips creation. The tips sheath was left in the middle hepatic vein but the colo pinto needle was removed. A 4 French 65 cm snare was advanced through the sheath and positioned in the middle hepatic vein. Similarly, a second 4 French 65 cm snare was advanced through the trans hepatic portal venous access and placed in the central right portal vein. The image intensifier was then angulated until the 2 snares overlapped. A 22 gauge 15 cm Chiba needle was then carefully advanced from the anterior abdominal wall using real-time fluoroscopic guidance through the liver and through both snares. A 300 cm glidewire advantage microwire was then advanced through the needle and the needle was retracted leaving the wire in place. The wire was then successfully snared with the snare located in the middle hepatic vein. The wire was brought back through  the tips sheath and out the hub such that there was flossing from the anterior abdominal wall through the anterior liver, through the right portal vein, into the middle hepatic vein, up the sheath an out the IJ access. A coaxial system was then created using a 150 cm 0.014 CXI support catheter inside a 130 cm 0.035 CXI support catheter. The coaxial catheter system was then loaded over  the 0.014 glidewire advantage catheter and advanced from the IJ access through the liver and out the anterior abdominal wall (only the 0.014 portion of the system was advanced through the anterior abdominal wall). The 0.035 CXI catheter was position with the radiopaque marker in the region of the right portal vein as identified by the intra portal snare. While maintaining through and through wire access, the 0.014 CXI support catheter was removed. A second 014 glidewire advantage wire was then advanced coaxially through the 0.035 catheter and navigated into the portal vein and down the SMV. Next, the through and through flossing wire was carefully removed. The 0.014 CXI support catheter was reintroduced over the new Glidewire Advantage wire coaxially through the 0.035 CXI support catheter. The catheter system was then carefully advanced through the access site in the right portal vein and into the central portal venous system. Contrast was injected confirming successful transhepatic portosystemic access. The 0.014 catheter and wire were removed. A superstiff Amplatz wire was advanced through the 035 catheter into the distal portal vein. The CXI support catheter was exchanged for a marker pigtail catheter. Portal venography was performed. Again, this confirms a large portosystemic collateral arising from the posterior gastric vein. Portal and systemic pressures were then measured. Portal pressure: 30 mm Hg Right atrial pressure: 10 mm Hg Portosystemic gradient: 20 mm Hg (severe portal hypertension) A 6 cm covered, 2 cm un covered Gore  Viatorr stent graft was then selected. The superstiff Amplatz wire was advanced through the marker pigtail catheter the pigtail catheter removed. The transhepatic tract was balloon dilated to 6 mm using a 6 x 40 mm mustang balloon. The tips sheath was then advanced over the balloon and into the main portal vein. At this time, maneuvers were performed in order to remove the snare around the sheath from the transhepatic portal venous access. To accomplish this, the tips sheath was pulled back to just within the transhepatic tract. The snare was closed around the existing superstiff Amplatz wire. A 5 French angled catheter and Glidewire were advanced coaxially through the tips sheath in the Glidewire and catheter system carefully passed into the main portal vein external to the closed snare. Portal venous access was confirmed by aspiration of blood and catheter injection. The glidewire was exchanged for a superstiff Amplatz wire. The initial superstiff Amplatz wire was then released from the snare and removed. This allowed successful capture and removal of the snare device. The tips sheath was readvanced over the superstiff Amplatz wire. The Viatorr stent graft was introduced through the sheath and the metal portion of the stent graph un covered. This deployed nicely within the main portal vein. The stent graft was then pulled back in till it was snug against the right portal venous entry site. The remainder of the stent graft was un sheath and then deployed. The stent graft was post dilated to 8 mm using an 8 x 40 mm Conquest balloon. The balloon was exchanged for the pigtail catheter which was reintroduced into the main portal vein. Repeat portal venography was performed. There is preferential flow through the newly created tips. The stent graft is widely patent and flow is brisk. There is decreased flow through the large portal systemic collateral system. Pressures were remeasured. Portal pressure: 23 mm Hg Right  atrial pressure: 18 mm Hg Portosystemic gradient: 5 mm Hg (normal portal venous pressure). At this time, some consideration was made for proceeding with embolization of the hypertrophic posterior gastric vein and associated shunt system.  However, flow appears to be preferential through the tips. The decision was made to defer embolization at this time. This could be embolized in the future if it becomes clinically symptomatic. The tips sheath was removed and hemostasis attained by manual pressure. The intravascular ultrasound device was removed and the right common femoral sheath removed and hemostasis attained by manual pressure. The transhepatic portal venous access sheath was removed. Bandages were applied all 3 sites. The patient tolerated the procedure well and was taken to PACU in stable condition. IMPRESSION: 1. Paracentesis yielding 3.5 L ascites. 2. Right thoracentesis yielding 3 L pleural fluid. 3. Successful creation of a right middle hepatic to right portal venous tips utilizing a 6 cm covered 2 cm uncovered Gore Viatorr stent graft post dilated to 8 mm. 4. Pre TIPS portosystemic gradient of 20 mmHg consistent with severe portal hypertension. Post TIPS portosystemic gradient of 5 mm Hg which is within normal limits. Signed, Criselda Peaches, MD, [facility] Electronically Signed   By: Jacqulynn Cadet M.D.   On: 11/02/2021 10:01   DG Chest Port 1 View  Result Date: 11/01/2021 CLINICAL DATA:  Status post right thoracentesis. EXAM: PORTABLE CHEST 1 VIEW COMPARISON:  Oct 30, 2021. FINDINGS: Right pleural effusion is nearly resolved status post thoracentesis. Mild right basilar subsegmental atelectasis is noted. Small right apical pneumothorax ex vacuo is noted. IMPRESSION: Right pleural effusion is nearly resolved status post thoracentesis. Small right apical pneumothorax ex vacuo is noted. Electronically Signed   By: Marijo Conception  M.D.   On: 11/01/2021 16:03   DG CHEST PORT 1 VIEW  Result Date: 10/30/2021 CLINICAL DATA:  Short of breath EXAM: PORTABLE CHEST 1 VIEW COMPARISON:  10/29/2021 FINDINGS: Large right pleural effusion. Apical component has improved however there is increase in pleural fluid in the mid and lower chest on the right. There is extensive airspace disease in the right lung particularly right lung base similar to the prior study. Improved aeration in the right apex. Left lung remains clear.  Normal vascularity.  Heart size normal. IMPRESSION: Progression of large right pleural effusion. Extensive airspace disease in the right lung probably the right lung base is similar. Improved aeration right apex. Left lung remains clear. Electronically Signed   By: Franchot Gallo M.D.   On: 10/30/2021 08:01   DG CHEST PORT 1 VIEW  Result Date: 10/29/2021 CLINICAL DATA:  Shortness of breath. EXAM: PORTABLE CHEST 1 VIEW COMPARISON:  10/27/2021 FINDINGS: Large right pleural effusion appears progressive in the interval with persistent airspace opacity in the right mid and lower lung. Left lung remains relatively clear. The cardiopericardial silhouette is within normal limits for size. The visualized bony structures of the thorax are unremarkable. Telemetry leads overlie the chest. IMPRESSION: Interval progression of large right pleural effusion with persistent airspace opacity in the right mid and lower lung. Electronically Signed   By: Misty Stanley M.D.   On: 10/29/2021 07:59   DG CHEST PORT 1 VIEW  Result Date: 10/27/2021 CLINICAL DATA:  Pleural effusion EXAM: PORTABLE CHEST 1 VIEW COMPARISON:  10/26/2021 FINDINGS: Heart size appears normal. Moderate-large layering right-sided pleural effusion with hazy airspace opacity in the right mid to lower lung. Left lung remains clear. No pneumothorax. IMPRESSION: Moderate-large layering right-sided pleural effusion with hazy airspace opacity in the right mid to lower lung. Appearance is  similar to the previous exam. Electronically Signed   By: Davina Poke D.O.   On: 10/27/2021 12:10  DG CHEST PORT 1 VIEW  Result Date: 10/26/2021 CLINICAL DATA:  Shortness of breath.  Right pleural effusion. EXAM: PORTABLE CHEST 1 VIEW COMPARISON:  Portable chest yesterday at 4:11 p.m. FINDINGS: 4:40 a.m., 10/26/2021. By report of the last study the patient underwent a 1 L right thoracentesis yesterday. Moderate layering right pleural effusion is again noted without appreciable pneumothorax. There is adjacent hazy atelectasis or consolidation in the right lower lung field. Rest of the lungs are clear. The cardiac size is normal. The left sulci are sharp. No acute osseous abnormality. IMPRESSION: No pneumothorax is seen following yesterday's thoracentesis. Moderate right pleural effusion remains with overlying atelectasis or consolidation. Overall aeration seems unchanged. Electronically Signed   By: Telford Nab M.D.   On: 10/26/2021 06:02   DG CHEST PORT 1 VIEW  Result Date: 10/25/2021 CLINICAL DATA:  Reason for exam: Right thoracenteiss 1.0 liter EXAM: PORTABLE CHEST - 1 VIEW COMPARISON:  Earlier film of the same day FINDINGS: No pneumothorax. Significant improvement of the right pleural effusion with moderate residual. Improved aeration with some residual consolidation/atelectasis at the right lung base. Left lung is clear. Heart size and mediastinal contours are within normal limits. Visualized bones unremarkable. IMPRESSION: No pneumothorax post right thoracentesis. Electronically Signed   By: Lucrezia Europe M.D.   On: 10/25/2021 16:29   DG CHEST PORT 1 VIEW  Result Date: 10/25/2021 CLINICAL DATA:  Shortness of breath, history of cirrhosis EXAM: PORTABLE CHEST 1 VIEW COMPARISON:  Radiograph 1 day prior FINDINGS: The cardiomediastinal silhouette is grossly stable. There is a large right pleural effusion, increased in size since the study from 1 day prior, with worsened aeration of the right lung. The  left lung is clear. There is no significant left effusion. There is no pneumothorax There is no acute osseous abnormality. IMPRESSION: Large right pleural effusion, increased in size since the study from 1 day prior with worsened aeration of the right lung. Electronically Signed   By: Valetta Mole M.D.   On: 10/25/2021 08:24   DG CHEST PORT 1 VIEW  Result Date: 10/24/2021 CLINICAL DATA:  Shortness of breath.  History of cirrhosis. EXAM: PORTABLE CHEST 1 VIEW COMPARISON:  AP chest 10/23/2021, chest two views 10/22/2021, CT chest 10/22/2021 FINDINGS: There is again a moderate right pleural effusion with associated right mid lung heterogeneous airspace opacity. The left lung is clear. No pneumothorax. Cardiac silhouette and mediastinal contours are within normal limits. No acute skeletal abnormality. IMPRESSION: Moderate right pleural effusion, unchanged. Electronically Signed   By: Yvonne Kendall M.D.   On: 10/24/2021 08:10   ECHOCARDIOGRAM COMPLETE  Result Date: 10/23/2021    ECHOCARDIOGRAM REPORT   Patient Name:   Katie Holland Date of Exam: 10/23/2021 Medical Rec #:  [id number]   Height:       64.0 in Accession #:    [id number]  Weight:       153.9 lb Date of Birth:  18-Feb-1966   BSA:          1.750 m Patient Age:    52 years    BP:           118/72 mmHg Patient Gender: F           HR:           87 bpm. Exam Location:  Inpatient Procedure: 2D Echo, Color Doppler and Cardiac Doppler Indications:    Elevated Troponin  History:        Patient has prior history of Echocardiogram examinations,  most                 recent 09/11/2021. Signs/Symptoms:Chest Pain and Shortness of                 Breath.  Sonographer:    Joette Catching RCS Referring Phys: [place]  1. Left ventricular ejection fraction, by estimation, is 65 to 70%. The left ventricle has normal function. The left ventricle has no regional wall motion abnormalities. Left ventricular diastolic parameters are indeterminate.  2. Right  ventricular systolic function is normal. The right ventricular size is normal.  3. Left atrial size was mildly dilated.  4. Right atrial size was mildly dilated.  5. Moderate pleural effusion.  6. The mitral valve is normal in structure. Mild mitral valve regurgitation. No evidence of mitral stenosis.  7. The aortic valve is normal in structure. Aortic valve regurgitation is not visualized. No aortic stenosis is present.  8. The inferior vena cava is normal in size with greater than 50% respiratory variability, suggesting right atrial pressure of 3 mmHg. FINDINGS  Left Ventricle: Left ventricular ejection fraction, by estimation, is 65 to 70%. The left ventricle has normal function. The left ventricle has no regional wall motion abnormalities. The left ventricular internal cavity size was normal in size. There is  no left ventricular hypertrophy. Left ventricular diastolic parameters are indeterminate. Right Ventricle: The right ventricular size is normal. No increase in right ventricular wall thickness. Right ventricular systolic function is normal. Left Atrium: Left atrial size was mildly dilated. Right Atrium: Right atrial size was mildly dilated. Pericardium: There is no evidence of pericardial effusion. Mitral Valve: The mitral valve is normal in structure. Mild mitral valve regurgitation. No evidence of mitral valve stenosis. Tricuspid Valve: The tricuspid valve is normal in structure. Tricuspid valve regurgitation is mild . No evidence of tricuspid stenosis. Aortic Valve: The aortic valve is normal in structure. Aortic valve regurgitation is not visualized. No aortic stenosis is present. Aortic valve mean gradient measures 4.0 mmHg. Aortic valve peak gradient measures 7.7 mmHg. Aortic valve area, by VTI measures 1.49 cm. Pulmonic Valve: The pulmonic valve was normal in structure. Pulmonic valve regurgitation is trivial. No evidence of pulmonic stenosis. Aorta: The aortic root is normal in size and structure.  Venous: The inferior vena cava is normal in size with greater than 50% respiratory variability, suggesting right atrial pressure of 3 mmHg. IAS/Shunts: No atrial level shunt detected by color flow Doppler. Additional Comments: There is a moderate pleural effusion.  LEFT VENTRICLE PLAX 2D LVIDd:         4.10 cm   Diastology LVIDs:         2.60 cm   LV e' medial:    10.90 cm/s LV PW:         0.70 cm   LV E/e' medial:  9.2 LV IVS:        0.70 cm   LV e' lateral:   14.90 cm/s LVOT diam:     1.60 cm   LV E/e' lateral: 6.7 LV SV:         42 LV SV Index:   24 LVOT Area:     2.01 cm  RIGHT VENTRICLE             IVC RV Basal diam:  3.80 cm     IVC diam: 1.40 cm RV Mid diam:    2.80 cm RV S prime:     15.90 cm/s TAPSE (M-mode):  2.4 cm LEFT ATRIUM             Index LA diam:        2.70 cm 1.54 cm/m LA Vol (A2C):   31.2 ml 17.83 ml/m LA Vol (A4C):   70.6 ml 40.34 ml/m LA Biplane Vol: 47.9 ml 27.37 ml/m  AORTIC VALVE                    PULMONIC VALVE AV Area (Vmax):    1.52 cm     PV Vmax:          1.03 m/s AV Area (Vmean):   1.44 cm     PV Peak grad:     4.2 mmHg AV Area (VTI):     1.49 cm     PR End Diast Vel: 1.63 msec AV Vmax:           139.00 cm/s AV Vmean:          96.300 cm/s AV VTI:            0.280 m AV Peak Grad:      7.7 mmHg AV Mean Grad:      4.0 mmHg LVOT Vmax:         105.00 cm/s LVOT Vmean:        69.000 cm/s LVOT VTI:          0.207 m LVOT/AV VTI ratio: 0.74  AORTA Ao Root diam: 2.90 cm Ao Asc diam:  3.10 cm MITRAL VALVE                TRICUSPID VALVE MV Area (PHT): 5.31 cm     TR Peak grad:   26.8 mmHg MV Decel Time: 143 msec     TR Vmax:        259.00 cm/s MV E velocity: 100.00 cm/s MV A velocity: 61.30 cm/s   SHUNTS MV E/A ratio:  1.63         Systemic VTI:  0.21 m                             Systemic Diam: 1.60 cm Candee Furbish MD Electronically signed by Candee Furbish MD Signature Date/Time: 10/23/2021/1:10:35 PM    Final    US ABDOMINAL PELVIC ART/VENT FLOW DOPPLER  Result Date: 10/11/2021 CLINICAL  DATA:  Alcoholic cirrhosis, recurrent large volume symptomatic abdominal ascites, preop planning for TIPS EXAM: DUPLEX ULTRASOUND OF LIVER TECHNIQUE: Color and duplex Doppler ultrasound was performed to evaluate the hepatic in-flow and out-flow vessels. COMPARISON:  CT 12/30/2018 FINDINGS: Liver: Small, with nodular contour and coarse echotexture . No focal lesion, mass or intrahepatic biliary ductal dilatation. Gallbladder is nondistended, containing short several stones measuring up to 0.6 cm. Main Portal Vein size: 1.1 cm proximal, 0.5 cm distal Portal Vein Velocities (all hepatopetal): Main Prox:  18 cm/sec Main Mid: 23 cm/sec Main Dist:  25 cm/sec Right: 55 cm/sec Left: 14 cm/sec Hepatic Vein Velocities (all hepatofugal): Right:  30 cm/sec Middle:  24 cm/sec Left:  54 cm/sec IVC: Present and patent with normal respiratory phasicity. Hepatic Artery Velocity:  107 cm/sec Splenic Vein Velocity:  15 cm/sec Spleen: 9.7 cm x 8.7 cm x 9.1 cm with a total volume of 402 cm^3 (411 cm^3 is upper limit normal) Portal Vein Occlusion/Thrombus: No Splenic Vein Occlusion/Thrombus: No Ascites: Present Varices: None seen Right pleural effusion incidentally noted. IMPRESSION: 1. Patent portal and hepatic veins, all demonstrating antegrade  flow signal, anatomically approachable for TIPS creation. 2. Abdominal ascites and right pleural effusion. 3. Cholelithiasis Electronically Signed   By: Lucrezia Europe M.D.   On: 10/11/2021 13:31   CT IMAGE GUIDED DRAINAGE BY PERCUTANEOUS CATHETER  Result Date: 11/04/2021 INDICATION: 56 year old female status post recent tips creation complicated postprocedure by hydropneumothorax which is enlarging. The patient presents for CT-guided thoracostomy tube. EXAM: CT IMAGE GUIDED DRAINAGE BY PERCUTANEOUS CATHETER COMPARISON:  None Available. MEDICATIONS: The patient is currently admitted to the hospital and receiving intravenous antibiotics. The antibiotics were administered within an appropriate  time frame prior to the initiation of the procedure. ANESTHESIA/SEDATION: Moderate (conscious) sedation was employed during this procedure. A total of Versed 1.5 mg and Fentanyl 50 mcg was administered intravenously. Moderate Sedation Time: 15 minutes. The patient's level of consciousness and vital signs were monitored continuously by radiology nursing throughout the procedure under my direct supervision. CONTRAST:  None COMPLICATIONS: None immediate. PROCEDURE: RADIATION DOSE REDUCTION: This exam was performed according to the departmental dose-optimization program which includes automated exposure control, adjustment of the mA and/or kV according to patient size and/or use of iterative reconstruction technique. Informed written consent was obtained from the patient after a discussion of the risks, benefits and alternatives to treatment. The patient was placed supine, partially left lateral decubitus on the CT gantry and a pre procedural CT was performed re-demonstrating the known fluid collection within the right pleural space. The procedure was planned. A timeout was performed prior to the initiation of the procedure. The right posterolateral and inferior thorax was prepped and draped in the usual sterile fashion. The overlying soft tissues were anesthetized with 1% lidocaine with epinephrine. Appropriate trajectory was planned with the use of a 22 gauge spinal needle. An 18 gauge trocar needle was advanced into the pleural space and a short Amplatz super stiff wire was coiled within the collection. Appropriate positioning was confirmed with a limited CT scan. The tract was serially dilated allowing placement of a 14 French all-purpose drainage catheter. Appropriate positioning was confirmed with a limited postprocedural CT scan. Air was immediately aspirated. The tube was connected to a pleurovac and sutured in place. A dressing was placed. The patient tolerated the procedure well without immediate post  procedural complication. IMPRESSION: Successful CT guided placement of a 69 French all purpose drain catheter into the anterior superior right pleural space. Ruthann Cancer, MD [facility] Radiology Electronically Signed   By: Ruthann Cancer M.D.   On: 11/04/2021 17:10   IR Radiologist Eval & Mgmt  Result Date: 10/11/2021 Please refer to notes tab for details about interventional procedure. (Op Note)  IR Paracentesis  Result Date: 11/02/2021 CLINICAL DATA:  56 year old female with a history of alcoholic cirrhosis complicated by recurrent large volume ascites and hepatic hydrothorax. She presents for paracentesis, thoracentesis and TIPS creation. EXAM: 1. Ultrasound-guided paracentesis 2. Ultrasound-guided right thoracentesis 3. Ultrasound-guided right common femoral venous access for introduction of intravascular ultrasound 4. TIPS creation MEDICATIONS: As antibiotic prophylaxis, 1 g Rocephin was ordered pre-procedure and administered intravenously within one hour of incision. ANESTHESIA/SEDATION: General - as administered by the Anesthesia department CONTRAST:  110 mL Omnipaque 300 FLUOROSCOPY TIME:  Radiation exposure index: 461 mGy reference air kerma COMPLICATIONS: None immediate. PROCEDURE: Informed written consent was obtained from the patient after a thorough discussion of the procedural risks, benefits and alternatives. All questions were addressed. Maximal Sterile Barrier Technique was utilized including caps, mask, sterile gowns, sterile gloves, sterile drape, hand hygiene and  skin antiseptic. A timeout was performed prior to the initiation of the procedure. PARACENTESIS The abdomen was interrogated with ultrasound. There is a large amount of free ascites. A suitable skin entry site was selected and marked. The region was sterilely prepped and draped in the standard fashion using chlorhexidine skin prep. Local anesthesia was attained by infiltration  with 1% lidocaine. A small dermatotomy was made. Under ultrasound guidance, a 6 French Safe-T-Centesis catheter was advanced into the fluid collection. Paracentesis was then performed yielding approximately 3.5 L of ascites. The catheter was removed and a bandage applied. THORACENTESIS The right chest was interrogated with ultrasound. There is a large right-sided pleural effusion. A suitable skin entry site was selected and marked. The region was sterilely prepped and draped in the standard fashion using chlorhexidine skin prep. Local anesthesia was attained by infiltration with 1% lidocaine. A small dermatotomy was made. Under ultrasound guidance, a 6 French Safe-T-Centesis catheter was advanced into the right pleural fluid. Thoracentesis was performed yielding 3 L of straw-colored pleural fluid. The catheter was removed. A bandage was applied. INTRAVASCULAR [place] The right common femoral vein was interrogated with ultrasound and found to be widely patent. An image was obtained and stored for the medical record. Local anesthesia was attained by infiltration with 1% lidocaine. A small dermatotomy was made. Under real-time sonographic guidance, the vessel was punctured with a 21 gauge micropuncture needle. Using standard technique, the initial micro needle was exchanged over a 0.018 micro wire for a transitional 4 [place] micro sheath. The micro sheath was then exchanged over a 0.035 wire for a 9 French vascular sheath. The 8 French intracardiac echocardiography (ICE) catheter was then advanced over a Bentson wire into the inferior cavoatrial junction. Intravascular ultrasound was then performed evaluating the supra hepatic and intra hepatic IVC, hepatic veins and portal veins. The portal veins are diminutive but patent. TIPS The right internal jugular vein was interrogated with ultrasound and found to be widely patent. An image was obtained and stored for the medical record. Local anesthesia was attained by infiltration  with 1% lidocaine. A small dermatotomy was made. Under real-time sonographic guidance, the vessel was punctured with a 21 gauge micropuncture needle. Using standard technique, the initial micro needle was exchanged over a 0.018 micro wire for a transitional 4 [place] micro sheath. The micro sheath was then exchanged over a 0.035 wire for a 5 French vascular sheath. Attention was turned to the right abdomen. The abdomen was interrogated with ultrasound. The liver is shrunken and extremely heterogeneous. The gallbladder is mildly distended. There was great difficulty in identifying the diminutive portal veins. Several small portal venous radicles were accessed, however the trajectory would not facilitate passage of a wire into the central portal venous system. Ultimately, using a combination of mid epigastric transabdominal ultrasound guidance, and a right lateral intra axillary needle approach, a 22 gauge needle was successfully advanced along a transhepatic course from lateral to medial and used to select a peripheral branch of the right portal vein. A 0.018 wire was navigated in the central portal veins. The needle was then exchanged and a 5 [place] Accustick sheath advanced over the needle and into the main portal vein. Transhepatic portal venography was then performed. The intrahepatic and main portal veins are markedly diminutive. The 5 French catheter is essentially occlusive in the right portal vein. There is marked hypertrophy of the posterior gastric vein filling a large portosystemic collateral. The Nitinol portion of the 0.018 wire was inserted with the tip  at the portal venous confluence to provide an intra portal marker. Attention was turned back to the 5 French sheath in the right internal jugular vein. The sheath was aspirated and flushed. A wire was introduced and passed through the heart into the inferior vena cava. The 5 French sheath was removed. The skin tract was dilated to 10 [place] and a 9 [place]  braided hydrophilic tips sheath advanced over the wire and positioned in the inferior vena cava. A 5 [place] MPA catheter was inserted coaxially through the sheath and the sheath was brought back into the right atrium. The catheter was then manipulated into the origin of the middle hepatic vein and the middle hepatic vein was selected over a Bentson wire. Once the catheter was in the middle hepatic vein, a hepatic venogram was performed confirming the venous anatomy. It is known that this represents the middle hepatic vein based on evaluation of prior CT imaging which demonstrates a diminutive and extremely posterior right hepatic vein. A superstiff Amplatz wire was advanced distally into the middle hepatic vein. The tips sheath was advanced over the wire into the middle hepatic vein. Initially, and are gone scorpion tips set was advanced over the wire, through the sheath and into the middle hepatic vein. And attempts at puncture was made, however purchase within the middle hepatic vein is tenuous and the scorpion needle could not be advanced through the hepatic parenchyma without buckling out of the middle hepatic vein. Therefore, the scorpion system was removed. The standard Ring set and Colopinto needle were next introduced. A pass was made with colo pinto needle. On lateral imaging, it is clear that the middle hepatic vein and the colo pinto needle are quite posterior with respect to the right portal vein. It is obvious that it will be extremely challenging to make this pass naturally. Given the very small size of the liver, and the tenuous purchase within the middle hepatic vein there is concern for complication with multiple needle passes. Therefore, the decision was made to proceed with gun site technique to facilitate tips creation. The tips sheath was left in the middle hepatic vein but the colo pinto needle was removed. A 4 French 65 cm snare was advanced through the sheath and positioned in the middle  hepatic vein. Similarly, a second 4 French 65 cm snare was advanced through the trans hepatic portal venous access and placed in the central right portal vein. The image intensifier was then angulated until the 2 snares overlapped. A 22 gauge 15 cm Chiba needle was then carefully advanced from the anterior abdominal wall using real-time fluoroscopic guidance through the liver and through both snares. A 300 cm glidewire advantage microwire was then advanced through the needle and the needle was retracted leaving the wire in place. The wire was then successfully snared with the snare located in the middle hepatic vein. The wire was brought back through the tips sheath and out the hub such that there was flossing from the anterior abdominal wall through the anterior liver, through the right portal vein, into the middle hepatic vein, up the sheath an out the IJ access. A coaxial system was then created using a 150 cm 0.014 CXI support catheter inside a 130 cm 0.035 CXI support catheter. The coaxial catheter system was then loaded over the 0.014 glidewire advantage catheter and advanced from the IJ access through the liver and out the anterior abdominal wall (only the 0.014 portion of the system was advanced through the anterior abdominal  wall). The 0.035 CXI catheter was position with the radiopaque marker in the region of the right portal vein as identified by the intra portal snare. While maintaining through and through wire access, the 0.014 CXI support catheter was removed. A second 014 glidewire advantage wire was then advanced coaxially through the 0.035 catheter and navigated into the portal vein and down the SMV. Next, the through and through flossing wire was carefully removed. The 0.014 CXI support catheter was reintroduced over the new Glidewire Advantage wire coaxially through the 0.035 CXI support catheter. The catheter system was then carefully advanced through the access site in the right portal vein and  into the central portal venous system. Contrast was injected confirming successful transhepatic portosystemic access. The 0.014 catheter and wire were removed. A superstiff Amplatz wire was advanced through the 035 catheter into the distal portal vein. The CXI support catheter was exchanged for a marker pigtail catheter. Portal venography was performed. Again, this confirms a large portosystemic collateral arising from the posterior gastric vein. Portal and systemic pressures were then measured. Portal pressure: 30 mm Hg Right atrial pressure: 10 mm Hg Portosystemic gradient: 20 mm Hg (severe portal hypertension) A 6 cm covered, 2 cm un covered Gore Viatorr stent graft was then selected. The superstiff Amplatz wire was advanced through the marker pigtail catheter the pigtail catheter removed. The transhepatic tract was balloon dilated to 6 mm using a 6 x 40 mm mustang balloon. The tips sheath was then advanced over the balloon and into the main portal vein. At this time, maneuvers were performed in order to remove the snare around the sheath from the transhepatic portal venous access. To accomplish this, the tips sheath was pulled back to just within the transhepatic tract. The snare was closed around the existing superstiff Amplatz wire. A 5 French angled catheter and Glidewire were advanced coaxially through the tips sheath in the Glidewire and catheter system carefully passed into the main portal vein external to the closed snare. Portal venous access was confirmed by aspiration of blood and catheter injection. The glidewire was exchanged for a superstiff Amplatz wire. The initial superstiff Amplatz wire was then released from the snare and removed. This allowed successful capture and removal of the snare device. The tips sheath was readvanced over the superstiff Amplatz wire. The Viatorr stent graft was introduced through the sheath and the metal portion of the stent graph un covered. This deployed nicely within  the main portal vein. The stent graft was then pulled back in till it was snug against the right portal venous entry site. The remainder of the stent graft was un sheath and then deployed. The stent graft was post dilated to 8 mm using an 8 x 40 mm Conquest balloon. The balloon was exchanged for the pigtail catheter which was reintroduced into the main portal vein. Repeat portal venography was performed. There is preferential flow through the newly created tips. The stent graft is widely patent and flow is brisk. There is decreased flow through the large portal systemic collateral system. Pressures were remeasured. Portal pressure: 23 mm Hg Right atrial pressure: 18 mm Hg Portosystemic gradient: 5 mm Hg (normal portal venous pressure). At this time, some consideration was made for proceeding with embolization of the hypertrophic posterior gastric vein and associated shunt system. However, flow appears to be preferential through the tips. The decision was made to defer embolization at this time. This could be embolized in the future if it becomes clinically symptomatic. The tips  sheath was removed and hemostasis attained by manual pressure. The intravascular ultrasound device was removed and the right common femoral sheath removed and hemostasis attained by manual pressure. The transhepatic portal venous access sheath was removed. Bandages were applied all 3 sites. The patient tolerated the procedure well and was taken to PACU in stable condition. IMPRESSION: 1. Paracentesis yielding 3.5 L ascites. 2. Right thoracentesis yielding 3 L pleural fluid. 3. Successful creation of a right middle hepatic to right portal venous tips utilizing a 6 cm covered 2 cm uncovered Gore Viatorr stent graft post dilated to 8 mm. 4. Pre TIPS portosystemic gradient of 20 mmHg consistent with severe portal hypertension. Post TIPS portosystemic gradient of 5 mm Hg which is within normal limits. Signed, Criselda Peaches, MD, [place] [facility] Radiology Electronically Signed   By: Jacqulynn Cadet M.D.   On: 11/02/2021 10:01   IR Paracentesis  Result Date: 10/22/2021 INDICATION: Patient with history of alcoholic cirrhosis with recurrent ascites. Request for therapeutic paracentesis with a 10 L max. EXAM: ULTRASOUND GUIDED THERAPEUTIC RIGHT LOWER QUADRANT PARACENTESIS MEDICATIONS: 10 mL 1 % lidocaine COMPLICATIONS: None immediate. PROCEDURE: Informed written consent was obtained from the patient after a discussion of the risks, benefits and alternatives to treatment. A timeout was performed prior to the initiation of the procedure. Initial ultrasound scanning demonstrates a large amount of ascites within the right lower abdominal quadrant. The right lower abdomen was prepped and draped in the usual sterile fashion. 1% lidocaine was used for local anesthesia. Following this, a 19 gauge, 7-cm, Yueh catheter was introduced. An ultrasound image was saved for documentation purposes. The paracentesis was performed. The catheter was removed and a dressing was applied. The patient tolerated the procedure well without immediate post procedural complication. Patient received post-procedure intravenous albumin; see nursing notes for details. FINDINGS: A total of approximately 9.8 L of clear, yellow fluid was removed. IMPRESSION: Successful ultrasound-guided paracentesis yielding 9.8 liters of peritoneal fluid. Read by: Narda Rutherford, AGNP-BC PLAN: If the patient eventually requires >/=2 paracenteses in a 30 day period, candidacy for formal evaluation by the [facility] will be assessed. Electronically Signed   By: Corrie Mckusick D.O.   On: 10/22/2021 16:15   IR THORACENTESIS ASP PLEURAL SPACE W/IMG GUIDE  Result Date: 11/02/2021 CLINICAL DATA:  56 year old female with a history of alcoholic cirrhosis complicated by recurrent large volume ascites and hepatic  hydrothorax. She presents for paracentesis, thoracentesis and TIPS creation. EXAM: 1. Ultrasound-guided paracentesis 2. Ultrasound-guided right thoracentesis 3. Ultrasound-guided right common femoral venous access for introduction of intravascular ultrasound 4. TIPS creation MEDICATIONS: As antibiotic prophylaxis, 1 g Rocephin was ordered pre-procedure and administered intravenously within one hour of incision. ANESTHESIA/SEDATION: General - as administered by the Anesthesia department CONTRAST:  110 mL Omnipaque 300 FLUOROSCOPY TIME:  Radiation exposure index: 461 mGy reference air kerma COMPLICATIONS: None immediate. PROCEDURE: Informed written consent was obtained from the patient after a thorough discussion of the procedural risks, benefits and alternatives. All questions were addressed. Maximal Sterile Barrier Technique was utilized including caps, mask, sterile gowns, sterile gloves, sterile drape, hand hygiene and skin antiseptic. A timeout was performed prior to the initiation of the procedure. PARACENTESIS The abdomen was interrogated with ultrasound. There is a large amount of free ascites. A suitable skin entry site was selected and marked. The region was sterilely prepped and draped in the standard fashion using chlorhexidine skin prep. Local anesthesia was attained by infiltration with  1% lidocaine. A small dermatotomy was made. Under ultrasound guidance, a 6 French Safe-T-Centesis catheter was advanced into the fluid collection. Paracentesis was then performed yielding approximately 3.5 L of ascites. The catheter was removed and a bandage applied. THORACENTESIS The right chest was interrogated with ultrasound. There is a large right-sided pleural effusion. A suitable skin entry site was selected and marked. The region was sterilely prepped and draped in the standard fashion using chlorhexidine skin prep. Local anesthesia was attained by infiltration with 1% lidocaine. A small dermatotomy was made.  Under ultrasound guidance, a 6 French Safe-T-Centesis catheter was advanced into the right pleural fluid. Thoracentesis was performed yielding 3 L of straw-colored pleural fluid. The catheter was removed. A bandage was applied. INTRAVASCULAR [place] The right common femoral vein was interrogated with ultrasound and found to be widely patent. An image was obtained and stored for the medical record. Local anesthesia was attained by infiltration with 1% lidocaine. A small dermatotomy was made. Under real-time sonographic guidance, the vessel was punctured with a 21 gauge micropuncture needle. Using standard technique, the initial micro needle was exchanged over a 0.018 micro wire for a transitional 4 [place] micro sheath. The micro sheath was then exchanged over a 0.035 wire for a 9 French vascular sheath. The 8 French intracardiac echocardiography (ICE) catheter was then advanced over a Bentson wire into the inferior cavoatrial junction. Intravascular ultrasound was then performed evaluating the supra hepatic and intra hepatic IVC, hepatic veins and portal veins. The portal veins are diminutive but patent. TIPS The right internal jugular vein was interrogated with ultrasound and found to be widely patent. An image was obtained and stored for the medical record. Local anesthesia was attained by infiltration with 1% lidocaine. A small dermatotomy was made. Under real-time sonographic guidance, the vessel was punctured with a 21 gauge micropuncture needle. Using standard technique, the initial micro needle was exchanged over a 0.018 micro wire for a transitional 4 [place] micro sheath. The micro sheath was then exchanged over a 0.035 wire for a 5 French vascular sheath. Attention was turned to the right abdomen. The abdomen was interrogated with ultrasound. The liver is shrunken and extremely heterogeneous. The gallbladder is mildly distended. There was great difficulty in identifying the diminutive portal veins. Several small  portal venous radicles were accessed, however the trajectory would not facilitate passage of a wire into the central portal venous system. Ultimately, using a combination of mid epigastric transabdominal ultrasound guidance, and a right lateral intra axillary needle approach, a 22 gauge needle was successfully advanced along a transhepatic course from lateral to medial and used to select a peripheral branch of the right portal vein. A 0.018 wire was navigated in the central portal veins. The needle was then exchanged and a 5 [place] Accustick sheath advanced over the needle and into the main portal vein. Transhepatic portal venography was then performed. The intrahepatic and main portal veins are markedly diminutive. The 5 French catheter is essentially occlusive in the right portal vein. There is marked hypertrophy of the posterior gastric vein filling a large portosystemic collateral. The Nitinol portion of the 0.018 wire was inserted with the tip at the portal venous confluence to provide an intra portal marker. Attention was turned back to the 5 French sheath in the right internal jugular vein. The sheath was aspirated and flushed. A wire was introduced and passed through the heart into the inferior vena cava. The 5 French sheath was removed. The skin tract was dilated to 10  [place] and a 9 [place] braided hydrophilic tips sheath advanced over the wire and positioned in the inferior vena cava. A 5 [place] MPA catheter was inserted coaxially through the sheath and the sheath was brought back into the right atrium. The catheter was then manipulated into the origin of the middle hepatic vein and the middle hepatic vein was selected over a Bentson wire. Once the catheter was in the middle hepatic vein, a hepatic venogram was performed confirming the venous anatomy. It is known that this represents the middle hepatic vein based on evaluation of prior CT imaging which demonstrates a diminutive and extremely posterior  right hepatic vein. A superstiff Amplatz wire was advanced distally into the middle hepatic vein. The tips sheath was advanced over the wire into the middle hepatic vein. Initially, and are gone scorpion tips set was advanced over the wire, through the sheath and into the middle hepatic vein. And attempts at puncture was made, however purchase within the middle hepatic vein is tenuous and the scorpion needle could not be advanced through the hepatic parenchyma without buckling out of the middle hepatic vein. Therefore, the scorpion system was removed. The standard Ring set and Colopinto needle were next introduced. A pass was made with colo pinto needle. On lateral imaging, it is clear that the middle hepatic vein and the colo pinto needle are quite posterior with respect to the right portal vein. It is obvious that it will be extremely challenging to make this pass naturally. Given the very small size of the liver, and the tenuous purchase within the middle hepatic vein there is concern for complication with multiple needle passes. Therefore, the decision was made to proceed with gun site technique to facilitate tips creation. The tips sheath was left in the middle hepatic vein but the colo pinto needle was removed. A 4 French 65 cm snare was advanced through the sheath and positioned in the middle hepatic vein. Similarly, a second 4 French 65 cm snare was advanced through the trans hepatic portal venous access and placed in the central right portal vein. The image intensifier was then angulated until the 2 snares overlapped. A 22 gauge 15 cm Chiba needle was then carefully advanced from the anterior abdominal wall using real-time fluoroscopic guidance through the liver and through both snares. A 300 cm glidewire advantage microwire was then advanced through the needle and the needle was retracted leaving the wire in place. The wire was then successfully snared with the snare located in the middle hepatic vein. The  wire was brought back through the tips sheath and out the hub such that there was flossing from the anterior abdominal wall through the anterior liver, through the right portal vein, into the middle hepatic vein, up the sheath an out the IJ access. A coaxial system was then created using a 150 cm 0.014 CXI support catheter inside a 130 cm 0.035 CXI support catheter. The coaxial catheter system was then loaded over the 0.014 glidewire advantage catheter and advanced from the IJ access through the liver and out the anterior abdominal wall (only the 0.014 portion of the system was advanced through the anterior abdominal wall). The 0.035 CXI catheter was position with the radiopaque marker in the region of the right portal vein as identified by the intra portal snare. While maintaining through and through wire access, the 0.014 CXI support catheter was removed. A second 014 glidewire advantage wire was then advanced coaxially through the 0.035 catheter and navigated into the portal  vein and down the SMV. Next, the through and through flossing wire was carefully removed. The 0.014 CXI support catheter was reintroduced over the new Glidewire Advantage wire coaxially through the 0.035 CXI support catheter. The catheter system was then carefully advanced through the access site in the right portal vein and into the central portal venous system. Contrast was injected confirming successful transhepatic portosystemic access. The 0.014 catheter and wire were removed. A superstiff Amplatz wire was advanced through the 035 catheter into the distal portal vein. The CXI support catheter was exchanged for a marker pigtail catheter. Portal venography was performed. Again, this confirms a large portosystemic collateral arising from the posterior gastric vein. Portal and systemic pressures were then measured. Portal pressure: 30 mm Hg Right atrial pressure: 10 mm Hg Portosystemic gradient: 20 mm Hg (severe portal hypertension) A 6 cm  covered, 2 cm un covered Gore Viatorr stent graft was then selected. The superstiff Amplatz wire was advanced through the marker pigtail catheter the pigtail catheter removed. The transhepatic tract was balloon dilated to 6 mm using a 6 x 40 mm mustang balloon. The tips sheath was then advanced over the balloon and into the main portal vein. At this time, maneuvers were performed in order to remove the snare around the sheath from the transhepatic portal venous access. To accomplish this, the tips sheath was pulled back to just within the transhepatic tract. The snare was closed around the existing superstiff Amplatz wire. A 5 French angled catheter and Glidewire were advanced coaxially through the tips sheath in the Glidewire and catheter system carefully passed into the main portal vein external to the closed snare. Portal venous access was confirmed by aspiration of blood and catheter injection. The glidewire was exchanged for a superstiff Amplatz wire. The initial superstiff Amplatz wire was then released from the snare and removed. This allowed successful capture and removal of the snare device. The tips sheath was readvanced over the superstiff Amplatz wire. The Viatorr stent graft was introduced through the sheath and the metal portion of the stent graph un covered. This deployed nicely within the main portal vein. The stent graft was then pulled back in till it was snug against the right portal venous entry site. The remainder of the stent graft was un sheath and then deployed. The stent graft was post dilated to 8 mm using an 8 x 40 mm Conquest balloon. The balloon was exchanged for the pigtail catheter which was reintroduced into the main portal vein. Repeat portal venography was performed. There is preferential flow through the newly created tips. The stent graft is widely patent and flow is brisk. There is decreased flow through the large portal systemic collateral system. Pressures were remeasured.  Portal pressure: 23 mm Hg Right atrial pressure: 18 mm Hg Portosystemic gradient: 5 mm Hg (normal portal venous pressure). At this time, some consideration was made for proceeding with embolization of the hypertrophic posterior gastric vein and associated shunt system. However, flow appears to be preferential through the tips. The decision was made to defer embolization at this time. This could be embolized in the future if it becomes clinically symptomatic. The tips sheath was removed and hemostasis attained by manual pressure. The intravascular ultrasound device was removed and the right common femoral sheath removed and hemostasis attained by manual pressure. The transhepatic portal venous access sheath was removed. Bandages were applied all 3 sites. The patient tolerated the procedure well and was taken to PACU in stable condition. IMPRESSION: 1. Paracentesis  yielding 3.5 L ascites. 2. Right thoracentesis yielding 3 L pleural fluid. 3. Successful creation of a right middle hepatic to right portal venous tips utilizing a 6 cm covered 2 cm uncovered Gore Viatorr stent graft post dilated to 8 mm. 4. Pre TIPS portosystemic gradient of 20 mmHg consistent with severe portal hypertension. Post TIPS portosystemic gradient of 5 mm Hg which is within normal limits. Signed, Criselda Peaches, MD, [facility] Electronically Signed   By: Jacqulynn Cadet M.D.   On: 11/02/2021 10:01   IR THORACENTESIS ASP PLEURAL SPACE W/IMG GUIDE  Result Date: 10/26/2021 INDICATION: Patient with history of cirrhosis and recurrent ascites, now with recurrent pleural effusion. Request made for diagnostic thoracentesis. EXAM: ULTRASOUND GUIDED RIGHT THORACENTESIS MEDICATIONS: 10 mL 1% lidocaine COMPLICATIONS: None immediate. PROCEDURE: An ultrasound guided thoracentesis was thoroughly discussed with the patient and questions answered. The benefits, risks, alternatives and  complications were also discussed. The patient understands and wishes to proceed with the procedure. Written consent was obtained. Ultrasound was performed to localize and mark an adequate pocket of fluid in the right chest. The area was then prepped and draped in the normal sterile fashion. 1% Lidocaine was used for local anesthesia. Under ultrasound guidance a 6 Fr Safe-T-Centesis catheter was introduced. Thoracentesis was performed. The catheter was removed and a dressing applied. FINDINGS: A total of approximately 1.0 liters of dark yellow fluid was removed. Samples were sent to the laboratory as requested by the clinical team. IMPRESSION: Successful ultrasound guided right thoracentesis yielding of pleural fluid. Read by: Brynda Greathouse PA-C Electronically Signed   By: Lucrezia Europe M.D.   On: 10/26/2021 08:56   IR THORACENTESIS ASP PLEURAL SPACE W/IMG GUIDE  Result Date: 10/23/2021 INDICATION: Shortness of breath in a patient with history of cirrhosis with recurrent ascites. Previous imaging showed right pleural effusion. Request for therapeutic and diagnostic thoracentesis. EXAM: ULTRASOUND GUIDED RIGHT THORACENTESIS MEDICATIONS: 8 mL 1% lidocaine COMPLICATIONS: None immediate. PROCEDURE: An ultrasound guided thoracentesis was thoroughly discussed with the patient and questions answered. The benefits, risks, alternatives and complications were also discussed. The patient understands and wishes to proceed with the procedure. Written consent was obtained. Ultrasound was performed to localize and mark an adequate pocket of fluid in the right chest. The area was then prepped and draped in the normal sterile fashion. 1% Lidocaine was used for local anesthesia. Under ultrasound guidance a 6 Fr Safe-T-Centesis catheter was introduced. Thoracentesis was performed. The catheter was removed and a dressing applied. FINDINGS: A total of approximately 1.9 L of hazy yellow fluid was removed. Samples were sent to the  laboratory as requested by the clinical team. Post procedure chest X-ray reviewed, negative for pneumothorax. IMPRESSION: Successful ultrasound guided right thoracentesis yielding 1.9 L of pleural fluid. Read by: Durenda Guthrie, PA-C Electronically Signed   By: Lucrezia Europe M.D.   On: 10/23/2021 13:36      Subjective:  Chest pain, no shortness of breath, no nausea, no vomiting Discharge Exam: Vitals:   11/08/21 0729 11/08/21 0800  BP: (!) 93/53 (!) 102/55  Pulse: 92   Resp: 13   Temp: 97.9 F (36.6 C)   SpO2: 97%    Vitals:   11/07/21 2310 11/08/21 0600 11/08/21 0729 11/08/21 0800  BP: 102/60 (!) 93/53 (!) 93/53 (!) 102/55  Pulse: 98  92   Resp: 13  13   Temp: 98.2 F (36.8 C)  97.9 F (36.6 C)   TempSrc: Oral  Oral  SpO2: 100%  97%   Weight:      Height:        General: Pt is alert, awake, not in acute distress Cardiovascular: RRR, S1/S2 +, no rubs, no gallops Respiratory: CTA bilaterally, no wheezing, no rhonchi Abdominal: Soft, minimal fluid wave, NT, ND, bowel sounds + Extremities: no edema, no cyanosis    The results of significant diagnostics from this hospitalization (including imaging, microbiology, ancillary and laboratory) are listed below for reference.     Microbiology: Recent Results (from the past 240 hour(s))  Culture, blood (Routine X 2) w Reflex to ID Panel     Status: None   Collection Time: 10/29/21  7:20 PM   Specimen: BLOOD LEFT HAND  Result Value Ref Range Status   Specimen Description BLOOD LEFT HAND  Final   Special Requests   Final    BOTTLES DRAWN AEROBIC AND ANAEROBIC Blood Culture adequate volume   Culture   Final    NO GROWTH 5 DAYS Performed at [facility], [place] [address] [place], [place] [address]    Report Status 11/03/2021 FINAL  Final  Culture, blood (Routine X 2) w Reflex to ID Panel     Status: None   Collection Time: 10/29/21  7:32 PM   Specimen: BLOOD RIGHT ARM  Result Value Ref Range Status   Specimen Description  BLOOD RIGHT ARM  Final   Special Requests   Final    BOTTLES DRAWN AEROBIC AND ANAEROBIC Blood Culture adequate volume   Culture   Final    NO GROWTH 5 DAYS Performed at [facility], [place] [address], [place],  [address]    Report Status 11/03/2021 FINAL  Final     Labs: BNP (last 3 results) No results for input(s): BNP in the last 8760 hours. Basic Metabolic Panel: Recent Labs  Lab 11/01/21 1814 11/02/21 0152 11/03/21 0101 11/05/21 0840 11/06/21 0050  NA  --  134* 133* 130* 130*  K 4.6 4.7 4.8 4.3 4.5  CL  --  100 100 97* 97*  CO2  --  '26 27 27 27  '$ GLUCOSE  --  130* 122* 131* 109*  BUN  --  23* 24* 13 9  CREATININE  --  0.77 0.81 0.71 0.58  CALCIUM  --  9.4 8.9 8.4* 8.5*   Liver Function Tests: Recent Labs  Lab 11/02/21 0152  AST 59*  ALT 34  ALKPHOS 87  BILITOT 0.9  PROT 6.0*  ALBUMIN 3.0*   No results for input(s): LIPASE, AMYLASE in the last 168 hours. No results for input(s): AMMONIA in the last 168 hours. CBC: Recent Labs  Lab 11/01/21 1814 11/02/21 0152 11/06/21 0050  WBC 11.5* 11.4* 5.4  NEUTROABS 10.6*  --   --   HGB 9.7* 10.0* 8.4*  HCT 31.7* 30.8* 25.0*  MCV 76.8* 73.9* 72.3*  PLT 144* 141* 102*   Cardiac Enzymes: No results for input(s): CKTOTAL, CKMB, CKMBINDEX, TROPONINI in the last 168 hours. BNP: Invalid input(s): POCBNP CBG: No results for input(s): GLUCAP in the last 168 hours. D-Dimer No results for input(s): DDIMER in the last 72 hours. Hgb A1c No results for input(s): HGBA1C in the last 72 hours. Lipid Profile No results for input(s): CHOL, HDL, LDLCALC, TRIG, CHOLHDL, LDLDIRECT in the last 72 hours. Thyroid function studies No results for input(s): TSH, T4TOTAL, T3FREE, THYROIDAB in the last 72 hours.  Invalid input(s): FREET3 Anemia work up No results for input(s): VITAMINB12, FOLATE, FERRITIN, TIBC,  IRON, RETICCTPCT in the last 72 hours. Urinalysis    Component Value Date/Time   COLORURINE YELLOW  10/23/2021 0255   APPEARANCEUR CLEAR 10/23/2021 0255   LABSPEC 1.009 10/23/2021 0255   PHURINE 7.0 10/23/2021 0255   GLUCOSEU NEGATIVE 10/23/2021 0255   HGBUR LARGE (A) 10/23/2021 0255   BILIRUBINUR NEGATIVE 10/23/2021 0255   KETONESUR NEGATIVE 10/23/2021 0255   PROTEINUR NEGATIVE 10/23/2021 0255   NITRITE NEGATIVE 10/23/2021 0255   LEUKOCYTESUR NEGATIVE 10/23/2021 0255   Sepsis Labs Invalid input(s): PROCALCITONIN,  WBC,  LACTICIDVEN Microbiology Recent Results (from the past 240 hour(s))  Culture, blood (Routine X 2) w Reflex to ID Panel     Status: None   Collection Time: 10/29/21  7:20 PM   Specimen: BLOOD LEFT HAND  Result Value Ref Range Status   Specimen Description BLOOD LEFT HAND  Final   Special Requests   Final    BOTTLES DRAWN AEROBIC AND ANAEROBIC Blood Culture adequate volume   Culture   Final    NO GROWTH 5 DAYS Performed at [facility], [place] [address], [place], [place] [address]    Report Status 11/03/2021 FINAL  Final  Culture, blood (Routine X 2) w Reflex to ID Panel     Status: None   Collection Time: 10/29/21  7:32 PM   Specimen: BLOOD RIGHT ARM  Result Value Ref Range Status   Specimen Description BLOOD RIGHT ARM  Final   Special Requests   Final    BOTTLES DRAWN AEROBIC AND ANAEROBIC Blood Culture adequate volume   Culture   Final    NO GROWTH 5 DAYS Performed at [facility], [place] [address], [place], [place] [address]    Report Status 11/03/2021 FINAL  Final     Time coordinating discharge: Over 30 minutes  SIGNED:   Phillips Climes, MD  [facility] 11/08/2021, 11:03 AM Pager   If 7PM-7AM, please contact night-coverage [email or web address] Password [email or web address]

## 2021-11-08 NOTE — Progress Notes (Signed)
Physical Therapy Treatment Patient Details Name: Katie Holland MRN: [id number] DOB: 07/30/1965 Today's Date: 11/08/2021   History of Present Illness 56 y/o female presented to ED on 10/22/21 for SOB and chest tightness after she received paracentesis earlier that day. Admitted for sepsis 2/2 E. coli bacteremia and CAP. Underwent TIPS 11/01/21.PMH: ETOH cirrhosis    PT Comments    Pt received in supine, agreeable to therapy session with emphasis on safety with transfers, gait progression using cane and safety with car transfers. Session time limited due to arrival of transport volunteer for pt discharge, pt instructed on IS use, gait using cane, activity progression within tolerance and pt sig other present also for instruction. Pt needing up to min guard for safety due to urinary urgency and mild instability. Pt continues to benefit from PT services to progress toward functional mobility goals.    Recommendations for follow up therapy are one component of a multi-disciplinary discharge planning process, led by the attending physician.  Recommendations may be updated based on patient status, additional functional criteria and insurance authorization.  Follow Up Recommendations  Home health PT     Assistance Recommended at Discharge Intermittent Supervision/Assistance  Patient can return home with the following A little help with walking and/or transfers;A little help with bathing/dressing/bathroom;Assistance with cooking/housework;Assist for transportation;Help with stairs or ramp for entrance   Equipment Recommendations  Cane    Recommendations for Other Services       Precautions / Restrictions Precautions Precautions: Fall Restrictions Weight Bearing Restrictions: No     Mobility  Bed Mobility Overal bed mobility: Modified Independent             General bed mobility comments: no physical assist needed    Transfers Overall transfer level: Needs assistance Equipment used:  None, Straight cane Transfers: Sit to/from Stand Sit to Stand: Min guard           General transfer comment: R flank pain causing antalgic/guarded movement but no LOB with cane    Ambulation/Gait Ambulation/Gait assistance: Min guard Gait Distance (Feet): 15 Feet (x3 (to/from bathroom<>EOB, then to transport wheelchair)) Assistive device: Straight cane Gait Pattern/deviations: Step-through pattern, Decreased stride length, Trunk flexed, Narrow base of support       General Gait Details: Assist at times to manuever walker around obstacles. Distance limited due to urinary urgency and arrival of volunteer to transport pt to car for DC    Balance Overall balance assessment: Needs assistance   Sitting balance-Leahy Scale: Good     Standing balance support: No upper extremity supported Standing balance-Leahy Scale: Fair                              Cognition Arousal/Alertness: Awake/alert Behavior During Therapy: WFL for tasks assessed/performed Overall Cognitive Status: Within Functional Limits for tasks assessed                                 General Comments: pleasantly cooperative; urinary urgency upon standing        Exercises      General Comments General comments (skin integrity, edema, etc.): Discussed with pt and spouse safety with car transfers, activity pacing, continued use of IS (with demo back to ~750 mL), and gradual progression of mobility within tolerance.      Pertinent Vitals/Pain Pain Assessment Pain Assessment: Faces Faces Pain Scale: Hurts a little bit Pain Location:  chest tube site with coughing (CT has since been removed but still sore) Pain Descriptors / Indicators: Grimacing, Guarding, Sore Pain Intervention(s): Monitored during session           PT Goals (current goals can now be found in the care plan section) Acute Rehab PT Goals Patient Stated Goal: to improve PT Goal Formulation: With  patient/family Time For Goal Achievement: 11/07/21 Progress towards PT goals: Progressing toward goals    Frequency    Min 3X/week      PT Plan Current plan remains appropriate       AM-PAC PT "6 Clicks" Mobility   Outcome Measure  Help needed turning from your back to your side while in a flat bed without using bedrails?: None Help needed moving from lying on your back to sitting on the side of a flat bed without using bedrails?: None Help needed moving to and from a bed to a chair (including a wheelchair)?: A Little Help needed standing up from a chair using your arms (e.g., wheelchair or bedside chair)?: A Little Help needed to walk in hospital room?: A Little Help needed climbing 3-5 steps with a railing? : A Little 6 Click Score: 20    End of Session   Activity Tolerance: Patient tolerated treatment well Patient left: in chair;with family/visitor present (in care of transport volunteer for DC) Nurse Communication: Mobility status PT Visit Diagnosis: Muscle weakness (generalized) (M62.81);Unsteadiness on feet (R26.81);Pain;Other abnormalities of gait and mobility (R26.89);Difficulty in walking, not elsewhere classified (R26.2) Pain - Right/Left: Right Pain - part of body:  (ribcage)     Time: 2694-8546 PT Time Calculation (min) (ACUTE ONLY): 17 min  Charges:  $Therapeutic Activity: 8-22 mins                     Hosie Sharman P., PTA Acute Rehabilitation Services Secure Chat Preferred 9a-5:30pm Office: [place] 11/08/2021, 4:46 PM

## 2021-11-08 NOTE — Progress Notes (Signed)
Interventional Radiology Brief Note:  Spoke with primary team this AM.  Patient is stable for discharge home.  Follow-up orders previously placed by [name], NP.  AVS updated.   Brynda Greathouse, MS RD PA-C

## 2021-11-08 NOTE — Progress Notes (Signed)
Pt and pt belongings transported to main entrance via wheelchair with volunteer services. AVS paperwork and discharge education provided to pt. IV removed. Discharged to private vehicle to home.

## 2021-11-08 NOTE — Consult Note (Signed)
   [facility] CM Inpatient Consult   11/08/2021  Katie Katie 1965/12/27 [id number]  Follow up:   [place] [facility] [ACO] Patient: Katie Katie Medicare  PCP: Billie Ruddy, MD, [place], which is listed for the [facility] follow up.  Met with the patient at the bedside. Introduced Armed forces training and education officer and reason for visit for follow up with PCP and made aware of Annual Wellness Visit on December 31, 2021 scheduled.  SDOH reviewed and no additional  changes. She endorses her provider office and she states she has no transportation needs today. Explained to patient regarding follow up with her PCP within the next two weeks and for her AWV.  Gave her an appointment reminder card and a 24 hour nurse advise line. Patient verbalized understanding and states she feel a lot better today.  She was starting her lunch.  Patient verbalized she would not mind if she was connected to her care management team for follow up.   Plan: Will assess for additional follow up needs.  Natividad Brood, RN BSN [facility] Liaison  [phone] business mobile phone Toll free office [phone]  Fax number: [phone] [email or web address]

## 2021-11-08 NOTE — TOC Transition Note (Addendum)
Transition of Care [facility]) - CM/SW Discharge Note   Patient Details  Name: Estoria Geary MRN: [id number] Date of Birth: 1965-11-26  Transition of Care [facility]) CM/SW Contact:  Carles Collet, RN Phone Number: 11/08/2021, 11:21 AM   Clinical Narrative:    Notified [facility] that patient will DC today. Cane and 3/1 will be delivered to the room today    Final next level of care: [place] Barriers to Discharge: Continued Medical Work up   Patient Goals and CMS Choice     Choice offered to / list presented to : Adult Children  Discharge Placement                       Discharge Plan and Services   Discharge Planning Services: CM Consult Post Acute Care Choice: Durable Medical Equipment, Home Health          DME Arranged: 3-N-1, Tub bench DME Agency: [employer] Date DME Agency Contacted: 10/25/21 Time DME Agency Contacted: 0175 Representative spoke with at DME Agency: [place]: PT, OT [place] Agency:  ([facility]) Date [place]: 10/25/21 Time [place]: 1130 Representative spoke with at [place] ([place]) Interventions     Readmission Risk Interventions     View : No data to display.

## 2021-11-11 ENCOUNTER — Telehealth: Payer: Self-pay | Admitting: *Deleted

## 2021-11-11 ENCOUNTER — Other Ambulatory Visit: Payer: Self-pay | Admitting: Interventional Radiology

## 2021-11-11 ENCOUNTER — Encounter: Payer: Self-pay | Admitting: *Deleted

## 2021-11-11 ENCOUNTER — Telehealth: Payer: Self-pay

## 2021-11-11 ENCOUNTER — Other Ambulatory Visit: Payer: Self-pay | Admitting: *Deleted

## 2021-11-11 ENCOUNTER — Other Ambulatory Visit (HOSPITAL_COMMUNITY): Payer: Self-pay | Admitting: Student

## 2021-11-11 ENCOUNTER — Telehealth: Payer: Self-pay | Admitting: Student

## 2021-11-11 DIAGNOSIS — K7460 Unspecified cirrhosis of liver: Secondary | ICD-10-CM

## 2021-11-11 NOTE — Telephone Encounter (Signed)
Pt discharged on 5/19. Pt will be due for labs Friday, 11/15/21. Pt is aware that she can go by the lab with no appt. Pt has been scheduled for a follow up appt with Dr. Havery Moros on Friday, 11/22/21 at 3:20 pm. Pt stated that she did not need a reminder call for labs. Pt verbalized understanding of all information and had no concerns at the end of the call.   Lab order in epic.

## 2021-11-11 NOTE — Patient Outreach (Signed)
Received a hospital discharge notification for Katie Holland . I have assigned Katie Lair, NP to call for follow up and determine if there are any Case Management needs.    Katie Holland, [place], [place] Management [phone]

## 2021-11-11 NOTE — Chronic Care Management (AMB) (Signed)
  Care Management   Outreach Note  11/11/2021 Name: Katie Holland MRN: [id number] DOB: [birth year]   Reason for referral : Advice Only   A telephone outreach was made today to confirm primary care physician. The patient explained she does not have a PCP. Gave MD referral Line [phone] and made patient aware RNCM will outreach from the [facility]. The patient was referred to the case management team for assistance with care management and care coordination.   [place] Management  Direct Dial: [phone]

## 2021-11-11 NOTE — Telephone Encounter (Signed)
Transition Care Management Unsuccessful Follow-up Telephone Call  Date of discharge and from where:  [place] 11-08-21 Dx: sepsis  Attempts:  1st Attempt  Reason for unsuccessful TCM follow-up call:  Left voice message   Transition Care Management Unsuccessful Follow-up Telephone Call  Date of discharge and from where:  [place] 11-08-21 Dx: sepsis  Attempts:  2nd Attempt  Reason for unsuccessful TCM follow-up call:  Left voice message

## 2021-11-11 NOTE — Patient Outreach (Signed)
[facility]) Care Management  11/11/2021  Katie Holland 07-24-1965 [id number]  Transition of care call. Referred to central [facility] team as pt was not sure if she would continue care at [facility]. Spoke with Katie Holland. She gave permission for intake and transition of care.   Pt was admitted for abdominal pain, distention and hypoxemia. Further evaluation revealed pneumonia and sepsis. She required multiple paracentesis and thoracentesis procedures and a chest tube for pneumothorax. Her total stay was 17 days.  She is currently in [place] with her daughter. Her husband is with her. She reports having very good support. She has all her meds except for the B12 which someone will pick up for her within the next 23 hours.  She has not seen her primary care provider since August 2022 and she says she will go back to that office. Pt given phone number to schedule her post hospitalization follow up.  She has been seeing her GI specialist frequently and will follow up with them on 11/22/21.   Patient Active Problem List   Diagnosis Date Noted   Pneumothorax on right 11/04/2021   Elevated lactic acid level 10/23/2021   Sepsis ([place]) 10/22/2021   Acute respiratory failure with hypoxia ([place]) 10/22/2021   CAP (community acquired pneumonia) 10/22/2021   Increased ammonia level 10/22/2021   Elevated troponin 10/22/2021   Abnormal TSH 10/22/2021   Adenomatous duodenal polyp    Gastritis and gastroduodenitis    Colon cancer screening    Encephalopathy, hepatic ([place]) 05/06/2019   Ascites 01/20/2019   Pleural effusion 01/20/2019   UTI (urinary tract infection) 01/20/2019   Acute upper GI bleeding 06/02/2018   Alcohol dependence syndrome ([place]) 06/02/2018   Decompensated hepatic cirrhosis (HCC)    Esophageal varices (HCC)    Alcoholic liver disease (HCC)    Protein-calorie malnutrition, severe 04/26/2018   Bleeding esophageal varices (HCC)    Abdominal pain 04/24/2018    Hematemesis 04/24/2018   Hematochezia 04/24/2018   Alcohol abuse 04/24/2018   Hyponatremia 04/24/2018   Hypokalemia 40/03/2724   Metabolic acidosis, normal anion gap (NAG) 04/24/2018   History of GI bleed 36/64/4034   Alcoholic cirrhosis of liver with ascites ([place]) 10/26/2017   Tobacco dependence 10/26/2017   Patient was recently discharged from hospital and all medications have been reviewed. Outpatient Encounter Medications as of 11/11/2021  Medication Sig   furosemide (LASIX) 40 MG tablet Take 1 tablet (40 mg total) by mouth daily.   lactulose (CHRONULAC) 10 GM/15ML solution Take 30 mLs (20 g total) by mouth 2 (two) times daily.   Multiple Vitamin (MULTIVITAMIN WITH MINERALS) TABS tablet Take 1 tablet by mouth daily.   pyridOXINE (B-6) 50 MG tablet Take 1 tablet (50 mg total) by mouth daily.   spironolactone (ALDACTONE) 100 MG tablet Take 1 tablet (100 mg total) by mouth daily.   thiamine 100 MG tablet Take 1 tablet (100 mg total) by mouth daily.   Vitamin D3 (VITAMIN D) 25 MCG tablet Take 1 tablet (1,000 Units total) by mouth daily.   Zinc Sulfate 220 (50 Zn) MG TABS Take 1 tablet (220 mg total) by mouth daily.   cyanocobalamin 1000 MCG tablet Take 1 tablet (1,000 mcg total) by mouth daily. (Patient not taking: Reported on 11/11/2021)   [DISCONTINUED] prednisoLONE acetate (PRED FORTE) 1 % ophthalmic suspension Place 1 drop into both eyes 2 (two) times daily. (Patient not taking: Reported on 11/11/2021)   Facility-Administered Encounter Medications as of 11/11/2021  Medication   lactated ringers  infusion   lactated ringers infusion   PATIENT ADVISED TO CALL DR. BANKS OFFICE AND MAKE AN APPT FOR POST HOSPITALIZATION EVALUATION. NP TO CALL PT AGAIN IN ONE WEEK. IF PT WILL RESUME CARE AT [place] WILL TRANSITION PT TO THEIR EMBEDDED CARE MANAGER Katie SANDLIN, RN.  Eulah Pont. Myrtie Neither, MSN, [facility] Gerontological Nurse Practitioner [facility] Care Management [phone]

## 2021-11-11 NOTE — Telephone Encounter (Signed)
   [facility] TIPS post-procedure phone call follow-up   Katie Holland is a 56 y.o. female who underwent TIPS creation at [facility] 11/01/20 with Dr. Laurence Ferrari  Patient is 10 day from procedure.   # of paracentesis in last month: 2 (one para during TIPS). Patient has also had 3 thoracenteses in the past month (one thora during TIPS). # of paracentesis in last 2 months: 4  Current diuretic regimen: Spironolactone 100 mg daily  Current pharmacologic encephalopathy prophylaxis/treatment: Lactulose 20 g BID  Post-TIPS Imaging: None   Labs: 5/13 and 5/17 Creatinine: 0.58 Total Bilirubin: 0.9 INR: 1.5 Sodium: 130 Albumin: 3.0 Ammonia: 47 (10/27/21)  Assessment: Katie Holland is a 56 y.o. female with history of cirrhosis. Patient is 10 days from TIPS creation and was discharged home from the hospital 11/08/21. Her post-procedure recovery was complicated by a right hydropneumothorax requiring chest tube placement 11/04/21 with removal 11/07/21. She is feeling well today and has had an uneventful weekend. She denies any shortness of breath. She endorses some mild abdominal pain and nausea (she thinks from lactulose) but is otherwise feeling ok. She is taking her medications as prescribed. She was discharged home with an Chiropodist which was damaged during transport home. I have left a new Incentive Spirometer for her at the Radiology registration desk and Katie Holland will pick this up 11/12/21. Katie Holland knows she can call IR with any questions/concerns.   Recommendation:  Continue current treatment plan with next Clinic follow up phone call scheduled for 11/19/21.  Electronically Signed: Theresa Duty, NP 11/11/2021, 11:30 AM

## 2021-11-12 ENCOUNTER — Telehealth: Payer: Self-pay | Admitting: Family Medicine

## 2021-11-12 NOTE — Telephone Encounter (Signed)
Noted  

## 2021-11-12 NOTE — Telephone Encounter (Signed)
Katie Holland from [employer] call and stated pt is out of town until the 25 and will be back on 26.Laurey Arrow stated he will try to admit her on Friday .

## 2021-11-13 ENCOUNTER — Other Ambulatory Visit: Payer: Self-pay | Admitting: *Deleted

## 2021-11-13 NOTE — Patient Outreach (Signed)
[facility]) Care Management  11/13/2021  Katie Holland [birth year] [id number]  Reviewed pt chart today. No hospital visit scheduled. Called Dr. Volanda Napoleon office and made appt for [name] for next Wednesday, May 31st at 10:30. Called pt and advised her of this appt and to be there at 10:15 am. Pt said this was agreeable and states she will be there.  NP to call pt next Tuesday.  Eulah Pont. Myrtie Neither, MSN, [facility] Gerontological Nurse Practitioner [facility] Care Management [phone]

## 2021-11-15 ENCOUNTER — Other Ambulatory Visit (INDEPENDENT_AMBULATORY_CARE_PROVIDER_SITE_OTHER): Payer: Medicare HMO

## 2021-11-15 DIAGNOSIS — K7031 Alcoholic cirrhosis of liver with ascites: Secondary | ICD-10-CM

## 2021-11-15 DIAGNOSIS — I8500 Esophageal varices without bleeding: Secondary | ICD-10-CM

## 2021-11-15 DIAGNOSIS — Z8701 Personal history of pneumonia (recurrent): Secondary | ICD-10-CM | POA: Diagnosis not present

## 2021-11-15 DIAGNOSIS — R69 Illness, unspecified: Secondary | ICD-10-CM | POA: Diagnosis not present

## 2021-11-15 DIAGNOSIS — F17210 Nicotine dependence, cigarettes, uncomplicated: Secondary | ICD-10-CM | POA: Diagnosis not present

## 2021-11-15 LAB — BASIC METABOLIC PANEL
BUN: 44 mg/dL — ABNORMAL HIGH (ref 6–23)
CO2: 25 mEq/L (ref 19–32)
Calcium: 9.1 mg/dL (ref 8.4–10.5)
Chloride: 97 mEq/L (ref 96–112)
Creatinine, Ser: 1.27 mg/dL — ABNORMAL HIGH (ref 0.40–1.20)
GFR: 47.15 mL/min — ABNORMAL LOW (ref >60.00)
Glucose, Bld: 93 mg/dL (ref 70–99)
Potassium: 4.4 mEq/L (ref 3.5–5.1)
Sodium: 131 mEq/L — ABNORMAL LOW (ref 135–145)

## 2021-11-15 NOTE — Telephone Encounter (Signed)
Fluid is worse in stomach. Put on diuretics, but may not be taking them, could only find spironolactone.  Pt poor historian, does not know where all medications are. 2+ pitting edema bilat lower extremities.   He is requesting SW consult & nurse to home.   PT will continue with her 2x/w x 4w.

## 2021-11-19 ENCOUNTER — Other Ambulatory Visit: Payer: Self-pay | Admitting: *Deleted

## 2021-11-19 ENCOUNTER — Other Ambulatory Visit: Payer: Self-pay

## 2021-11-19 ENCOUNTER — Telehealth: Payer: Self-pay | Admitting: Family Medicine

## 2021-11-19 ENCOUNTER — Encounter: Payer: Self-pay | Admitting: *Deleted

## 2021-11-19 ENCOUNTER — Telehealth: Payer: Self-pay | Admitting: Student

## 2021-11-19 DIAGNOSIS — K7031 Alcoholic cirrhosis of liver with ascites: Secondary | ICD-10-CM

## 2021-11-19 DIAGNOSIS — I8500 Esophageal varices without bleeding: Secondary | ICD-10-CM

## 2021-11-19 NOTE — Telephone Encounter (Signed)
Will OT will [facility] is calling and would like verbal orders for OT 1X4

## 2021-11-19 NOTE — Telephone Encounter (Signed)
   [facility] TIPS post-procedure phone call follow-up  Katie Holland is a 56 y.o. female who underwent TIPS creation at [facility] 11/01/20 with Dr. Laurence Ferrari   Patient is a little over two weeks out from her from procedure.    # of paracentesis in last month: 2 (one para during TIPS). Patient has also had 3 thoracenteses in the past month (one thora during TIPS). # of paracentesis in last 2 months: 4   Current diuretic regimen: Spironolactone 100 mg daily  Current pharmacologic encephalopathy prophylaxis/treatment: Lactulose 20 g BID   Post-TIPS Imaging: None   Labs:  Creatinine: 1.27 Total Bilirubin: 0.9 INR: 1.5 Sodium: 131 Albumin: 3.0 Ammonia: 47    Assessment: Katie Holland is a 56 y.o. female with history of cirrhosis. Patient is a little over two weeks out from from TIPS creation and was discharged home from the hospital 11/08/21. Her post-procedure recovery was complicated by a right hydropneumothorax requiring chest tube placement 11/04/21 with removal 11/07/21. She states she feels better and stronger every day but she has developed lower extremity edema and some swelling in her abdomen but "nothing like it was before my procedure." She is taking her medications as directed and states she has no needs or concerns at this time.   Recommendation:  Continue current treatment plan with next Clinic follow up scheduled for 11/27/21 - she will follow up with Dr. Laurence Ferrari at the outpatient clinic.   Electronically Signed: Theresa Duty, NP 11/19/2021, 3:22 PM

## 2021-11-19 NOTE — Patient Outreach (Signed)
[place] [facility]) Care Management  11/19/2021  Gaylynn Seiple 05-Feb-1966 [id number]  Telephone outreach unsuccessful. Left a message, requesting a return call. Pt has a post hospitalization follow up with Dr. Volanda Napoleon tomorrow.  Contacted Peter Garter, RN, Embedded Care Management at [facility] and advised of pending new pt for transition to her at [place].   Eulah Pont. Myrtie Neither, MSN, GNP-BC Gerontological Nurse Practitioner [facility] Care Management [phone]  Mrs. Tony returned my call this afternoon.  [place]) Care Management Geriatric Nurse Practitioner Note   11/19/2021 Name:  Amesha Bailey MRN:  [id number] DOB:  [birth year]  Summary: Gretta Cool, POST TIPS PROCEDURE FOR CIRRHOSIS.  Recommendations/Changes made from today's visit: Use your [employer] transportation benefits for needed MD appt rides. Call in advance to secure a ride. NP will refer to care guide for resources for paying utilities. Pt does get $65.00 food stamps. Will transition pt to embedded care manager, Peter Garter, RN for continued care management servcies.  Subjective: Samiah Ricklefs is an 56 y.o. year old female who is a primary patient of Billie Ruddy, MD. The care management team was consulted for assistance with care management and/or care coordination needs.    Geriatric Nurse Practitioner completed Telephone Visit today.     Medications Reviewed Today     Reviewed by Deloria Lair, NP (Nurse Practitioner) on 11/19/21 at 1208  Med List Status: <None>   Medication Order Taking? Sig Documenting Provider Last Dose Status Informant  cyanocobalamin 1000 MCG tablet [id number] Yes Take 1 tablet (1,000 mcg total) by mouth daily. Elgergawy, Silver Huguenin, MD Taking Active   furosemide (LASIX) 40 MG tablet [id number] Yes Take 1 tablet (40 mg total) by mouth daily. Elgergawy, Silver Huguenin, MD Taking Active   lactulose (CHRONULAC) 10 GM/15ML solution [id number] Yes Take 30 mLs (20 g total) by mouth  2 (two) times daily. Elgergawy, Silver Huguenin, MD Taking Active   Multiple Vitamin (MULTIVITAMIN WITH MINERALS) TABS tablet [id number] Yes Take 1 tablet by mouth daily. [provider] Taking Active Self, Spouse/Significant Other  pyridOXINE (B-6) 50 MG tablet [id number] Yes Take 1 tablet (50 mg total) by mouth daily. Elgergawy, Silver Huguenin, MD Taking Active   spironolactone (ALDACTONE) 100 MG tablet [id number] Yes Take 1 tablet (100 mg total) by mouth daily. Elgergawy, Silver Huguenin, MD Taking Active   thiamine 100 MG tablet [id number] Yes Take 1 tablet (100 mg total) by mouth daily. Elgergawy, Silver Huguenin, MD Taking Active   Vitamin D3 (VITAMIN D) 25 MCG tablet [id number] Yes Take 1 tablet (1,000 Units total) by mouth daily. Elgergawy, Silver Huguenin, MD Taking Active   Zinc Sulfate 220 (50 Zn) MG TABS [id number] Yes Take 1 tablet (220 mg total) by mouth daily. Elgergawy, Silver Huguenin, MD Taking Active              SDOH:  (Social Determinants of Health) assessments and interventions performed:  SDOH Interventions    Flowsheet Row Most Recent Value  SDOH Interventions   Food Insecurity Interventions Intervention Not Indicated  Financial Strain Interventions --  [Referring to care guide.]  Social Connections Interventions Other (Comment)  Transportation Interventions Intervention Not Indicated       Care Plan  Review of patient past medical history, allergies, medications, health status, including review of consultants reports, laboratory and other test data, was performed as part of comprehensive evaluation for care management services.   NO CARE PLAN WAS DEVELOPED TODAY, HOWEVER, PT RECOGNIZES THE IMPORTANCE OF ATTENDING MD APPTS, FOLLOWING MEDICAL ORDERS TO  MINIMIZE COMPLICATIONS WITH HER LIVER. TAKE ORDERED MEDS, ABSTAIN FROM ETOH (NOT USING)    Plan: The patient has been provided with contact information for the care management team and has been advised to call with any health related questions or  concerns.  [place] at [employer], Peter Garter, RN.  Eulah Pont. Myrtie Neither, MSN, [facility] Gerontological Nurse Practitioner [facility] Care Management [phone]

## 2021-11-20 ENCOUNTER — Inpatient Hospital Stay: Payer: Medicare HMO | Admitting: Family Medicine

## 2021-11-21 NOTE — Telephone Encounter (Signed)
Okay 

## 2021-11-21 NOTE — Telephone Encounter (Signed)
Patient no showed appointment

## 2021-11-21 NOTE — Telephone Encounter (Signed)
Spoke with will, gave VO for OT, per Dr Volanda Napoleon.

## 2021-11-22 ENCOUNTER — Ambulatory Visit: Payer: Medicare HMO | Admitting: Gastroenterology

## 2021-11-22 ENCOUNTER — Telehealth: Payer: Self-pay | Admitting: *Deleted

## 2021-11-22 NOTE — Progress Notes (Deleted)
HPI :  56 y/o female here for a follow up visit for decompensated alcoholic cirrhosis, accompanied by her husband today. Recall she has a history of significant esophageal varices leading to bleeding in the past.  She has had a few admissions at the end of 2019 and required over 25 bands to eradicate varices.  Her course is also been complicated by significant ascites.   Fortunately she has not been in the hospital for any cirrhosis related problem for a few years now.  She over time has had issues with persistent ascites.  Recall at visits in the past she has continued to drink alcohol periodically.  The last few visits we had discussed this further and she states she has been completely abstinent from alcohol for several months now.  Other issues has been compliance with her diuretics in the past, she historically had not followed up for lab draws.  Since have last seen her she has been able to follow-up for lab draws, her renal function has been stable on Lasix 20 mg twice daily and Aldactone 100 mg daily.  Despite this, unfortunately she continues to have a lot of problems with ascites.  She is having large-volume paracentesis every 2 weeks.  She states it makes her feel better but it is short-lived and ascites continues to recur and she is quite distended after about a week and a half.  She has difficulty lying down due to the fluid.  She states again she is compliant with her medications.  She does drink a lot of free water from her report, we discussed that.  She has not been on nonselective beta-blockade in the past due to difficulty managing her ascites.  She is scheduled for surveillance endoscopy with me next month.  She again inquires about TIPS procedure, the radiologist have been asking her about that when she goes for paracentesis.  We had held off previously due to her noncompliance with alcohol abstinence and labs in the past however however we discussed this again today.      Most recent  endoscopies: EGD 07/07/18 - 4 bands placed EGD 07/27/18 - 3 bands placed EGD 08/30/18 - 3 bands placed  EGD 11/08/18 -no varices amenable to banding, portal hypertensive gastritis EGD 01/31/19 - portal hypertensive gastritis, no varices   EGD 08/01/19 - There was extensive scarring in the distal esophagus secondary to prior banding, but no obvious varices noted today. The exam of the esophagus was otherwise normal. Diffuse severely congested mucosa was found in the gastric antrum secondary to portal hypertension.. The exam of the stomach was otherwise normal. No gastric varices. The duodenal bulb and second portion of the duodenum were normal.     Colonoscopy 08/01/19 - Preparation of the colon was fair - most of the colon was well visualized - a small portion of the transverse colon, and then the splenic flexure / proximal descending colon had some residual stool that could not be cleared but nothing obvious noted in these areas. - Hemorrhoids found on perianal exam. - The examined portion of the ileum was normal. - Diffuse colonic congestion / edema in the entire examined colon from portal hypertension. - Internal hemorrhoids. - The examination was otherwise normal.     EGD 08/07/20: Esophagogastric landmarks identified. - Normal esophagus otherwise - scarring from prior banding noted but appreciable varices. - Antral gastritis with an erosion. - Normal stomach otherwise - biopsies taken to rule out H pylori - A single duodenal polyp. Resected and  retrieved. - Normal duodenum otherwise     RUQ [place] 12/27/20 - IMPRESSION: 1. Cirrhosis without evidence of focal hepatic lesion. 2. Moderate to large volume abdominopelvic ascites      EGD 08/22/21:Esophagogastric landmarks were identified: the Z-line was found at 38 cm, the gastroesophageal junction was found at 38 cm and the upper extent of the gastric folds was found at 38 cm from the incisors. Findings: Trace varices were found in the lower  third of the esophagus without any stigmata for bleeding. . There were multiple areas of scarring from prior bandings. The exam of the esophagus was otherwise normal. A large amount of food (residue) was found in the gastric fundus and in the gastric body. Once this was seen the procedure was aborted due to aspiration risk and full exam not performed.   Admitted with hepatic hydrothorax Underwent TIPS while inpatient 5/12 - significant drop in portal HTN gradient from 20 to 5         Past Medical History:  Diagnosis Date   Alcoholic liver disease ([place])    Chronic back pain    on disability   Cirrhosis of liver (HCC)    Esophageal varices (HCC)    GERD (gastroesophageal reflux disease)    Headache    Jaundice      Past Surgical History:  Procedure Laterality Date   BACK SURGERY     L3-L4   BIOPSY  08/07/2020   Procedure: BIOPSY;  Surgeon: Yetta Flock, MD;  Location: [facility];  Service: Gastroenterology;;   COLONOSCOPY WITH PROPOFOL N/A 08/01/2019   Procedure: COLONOSCOPY WITH PROPOFOL;  Surgeon: Yetta Flock, MD;  Location: [facility];  Service: Gastroenterology;  Laterality: N/A;   ESOPHAGEAL BANDING  04/25/2018   Procedure: ESOPHAGEAL BANDING;  Surgeon: Yetta Flock, MD;  Location: [facility] ENDOSCOPY;  Service: Gastroenterology;;   ESOPHAGEAL BANDING  04/27/2018   Procedure: ESOPHAGEAL BANDING;  Surgeon: Milus Banister, MD;  Location: [place];  Service: Endoscopy;;   ESOPHAGEAL BANDING  06/02/2018   Procedure: ESOPHAGEAL BANDING;  Surgeon: Doran Stabler, MD;  Location: [place];  Service: Gastroenterology;;   ESOPHAGEAL BANDING N/A 07/07/2018   Procedure: ESOPHAGEAL BANDING;  Surgeon: Yetta Flock, MD;  Location: [facility];  Service: Gastroenterology;  Laterality: N/A;   ESOPHAGEAL BANDING  07/27/2018   Procedure: ESOPHAGEAL BANDING;  Surgeon: Yetta Flock, MD;  Location: [facility];  Service: Gastroenterology;;    ESOPHAGEAL BANDING  08/30/2018   Procedure: ESOPHAGEAL BANDING;  Surgeon: Yetta Flock, MD;  Location: [facility];  Service: Gastroenterology;;   ESOPHAGOGASTRODUODENOSCOPY N/A 04/27/2018   Procedure: ESOPHAGOGASTRODUODENOSCOPY (EGD);  Surgeon: Milus Banister, MD;  Location[facility] END[facility]COPY;  Service: Endoscopy;  Laterality: N/A;   ESOPHAGOGASTRODUODENOSCOPY (EGD) WITH PROPOFOL N/A 04/25/2018   Procedure: ESOPHAGOGASTRODUODENOSCOPY (EGD) WITH PROPOFOL;  Surgeon: Yetta Flock, MD;  Location: [place];  Service: Gastroenterology;  Laterality: N/A;   ESOPHAGOGASTRODUODENOSCOPY (EGD) WITH PROPOFOL N/A 06/02/2018   Procedure: ESOPHAGOGASTRODUODENOSCOPY (EGD) WITH PROPOFOL;  Surgeon: Doran Stabler, MD;  Location: [place];  Service: Gastroenterology;  Laterality: N/A;   ESOPHAGOGASTRODUODENOSCOPY (EGD) WITH PROPOFOL N/A 07/07/2018   Procedure: ESOPHAGOGASTRODUODENOSCOPY (EGD) WITH PROPOFOL;  Surgeon: Yetta Flock, MD;  Location: [facility];  Service: Gastroenterology;  Laterality: N/A;   ESOPHAGOGASTRODUODENOSCOPY (EGD) WITH PROPOFOL N/A 07/27/2018   Procedure: ESOPHAGOGASTRODUODENOSCOPY (EGD) WITH PROPOFOL;  Surgeon: Yetta Flock, MD;  Location: [facility];  Service: Gastroenterology;  Laterality: N/A;   ESOPHAGOGASTRODUODENOSCOPY (EGD) WITH PROPOFOL N/A 08/30/2018  Procedure: ESOPHAGOGASTRODUODENOSCOPY (EGD) WITH PROPOFOL;  Surgeon: Yetta Flock, MD;  Location: [facility];  Service: Gastroenterology;  Laterality: N/A;   ESOPHAGOGASTRODUODENOSCOPY (EGD) WITH PROPOFOL N/A 11/08/2018   Procedure: ESOPHAGOGASTRODUODENOSCOPY (EGD) WITH PROPOFOL;  Surgeon: Yetta Flock, MD;  Location: [facility];  Service: Gastroenterology;  Laterality: N/A;   ESOPHAGOGASTRODUODENOSCOPY (EGD) WITH PROPOFOL N/A 01/31/2019   Procedure: ESOPHAGOGASTRODUODENOSCOPY (EGD) WITH PROPOFOL;  Surgeon: Yetta Flock, MD;  Location: [facility];  Service:  Gastroenterology;  Laterality: N/A;   ESOPHAGOGASTRODUODENOSCOPY (EGD) WITH PROPOFOL N/A 08/01/2019   Procedure: ESOPHAGOGASTRODUODENOSCOPY (EGD) WITH PROPOFOL;  Surgeon: Yetta Flock, MD;  Location: [facility];  Service: Gastroenterology;  Laterality: N/A;   ESOPHAGOGASTRODUODENOSCOPY (EGD) WITH PROPOFOL N/A 08/07/2020   Procedure: ESOPHAGOGASTRODUODENOSCOPY (EGD) WITH PROPOFOL;  Surgeon: Yetta Flock, MD;  Location: [facility];  Service: Gastroenterology;  Laterality: N/A;   ESOPHAGOGASTRODUODENOSCOPY (EGD) WITH PROPOFOL N/A 08/22/2021   Procedure: ESOPHAGOGASTRODUODENOSCOPY (EGD) WITH PROPOFOL;  Surgeon: Yetta Flock, MD;  Location: [facility];  Service: Gastroenterology;  Laterality: N/A;   IR INTRAVASCULAR ULTRASOUND NON CORONARY  11/05/2021   IR PARACENTESIS  04/26/2018   IR PARACENTESIS  05/18/2018   IR PARACENTESIS  05/31/2018   IR PARACENTESIS  06/30/2018   IR PARACENTESIS  08/05/2018   IR PARACENTESIS  08/31/2018   IR PARACENTESIS  10/08/2018   IR PARACENTESIS  10/28/2018   IR PARACENTESIS  02/14/2019   IR PARACENTESIS  03/09/2019   IR PARACENTESIS  04/05/2019   IR PARACENTESIS  05/03/2019   IR PARACENTESIS  06/20/2019   IR PARACENTESIS  07/19/2019   IR PARACENTESIS  08/19/2019   IR PARACENTESIS  09/27/2019   IR PARACENTESIS  10/07/2019   IR PARACENTESIS  10/28/2019   IR PARACENTESIS  11/16/2019   IR PARACENTESIS  12/30/2019   IR PARACENTESIS  01/16/2020   IR PARACENTESIS  02/07/2020   IR PARACENTESIS  03/06/2020   IR PARACENTESIS  04/04/2020   IR PARACENTESIS  04/17/2020   IR PARACENTESIS  05/04/2020   IR PARACENTESIS  06/07/2020   IR PARACENTESIS  06/28/2020   IR PARACENTESIS  07/27/2020   IR PARACENTESIS  08/20/2020   IR PARACENTESIS  09/13/2020   IR PARACENTESIS  10/04/2020   IR PARACENTESIS  10/25/2020   IR PARACENTESIS  11/07/2020   IR PARACENTESIS  11/30/2020   IR PARACENTESIS  12/13/2020   IR PARACENTESIS  01/04/2021   IR PARACENTESIS  01/25/2021   IR PARACENTESIS   02/20/2021   IR PARACENTESIS  03/20/2021   IR PARACENTESIS  04/18/2021   IR PARACENTESIS  05/14/2021   IR PARACENTESIS  06/10/2021   IR PARACENTESIS  06/27/2021   IR PARACENTESIS  07/17/2021   IR PARACENTESIS  08/12/2021   IR PARACENTESIS  08/23/2021   IR PARACENTESIS  09/13/2021   IR PARACENTESIS  10/02/2021   IR PARACENTESIS  10/22/2021   IR PARACENTESIS  11/01/2021   IR RADIOLOGIST EVAL & MGMT  12/23/2018   IR RADIOLOGIST EVAL & MGMT  10/11/2021   IR THORACENTESIS ASP PLEURAL SPACE W/IMG GUIDE  12/10/2018   IR THORACENTESIS ASP PLEURAL SPACE W/IMG GUIDE  01/21/2019   IR THORACENTESIS ASP PLEURAL SPACE W/IMG GUIDE  10/23/2021   IR THORACENTESIS ASP PLEURAL SPACE W/IMG GUIDE  10/25/2021   IR THORACENTESIS ASP PLEURAL SPACE W/IMG GUIDE  11/01/2021   IR TIPS  11/01/2021   IR US GUIDE VASC ACCESS RIGHT  11/01/2021   IR US GUIDE VASC ACCESS RIGHT  11/01/2021   IR US GUIDE VASC ACCESS RIGHT  11/01/2021   RADIOLOGY WITH ANESTHESIA N/A 11/01/2021   Procedure: TIPS;  Surgeon: Criselda Peaches, MD;  Location: [place];  Service: Radiology;  Laterality: N/A;   Family History  Problem Relation Age of Onset   Colon cancer Neg Hx    Esophageal cancer Neg Hx    Pancreatic cancer Neg Hx    Stomach cancer Neg Hx    Social History   Tobacco Use   Smoking status: Some Days    Packs/day: 0.25    Years: 20.00    Pack years: 5.00    Types: Cigarettes   Smokeless tobacco: Never   Tobacco comments:    5 a day  Vaping Use   Vaping Use: Never used  Substance Use Topics   Alcohol use: Not Currently   Drug use: Not Currently   Current Outpatient Medications  Medication Sig Dispense Refill   cyanocobalamin 1000 MCG tablet Take 1 tablet (1,000 mcg total) by mouth daily. 30 tablet 0   furosemide (LASIX) 40 MG tablet Take 1 tablet (40 mg total) by mouth daily. 30 tablet 0   lactulose (CHRONULAC) 10 GM/15ML solution Take 30 mLs (20 g total) by mouth 2 (two) times daily. 946 mL 0   Multiple Vitamin (MULTIVITAMIN WITH  MINERALS) TABS tablet Take 1 tablet by mouth daily.     pyridOXINE (B-6) 50 MG tablet Take 1 tablet (50 mg total) by mouth daily. 30 tablet 0   spironolactone (ALDACTONE) 100 MG tablet Take 1 tablet (100 mg total) by mouth daily. 30 tablet 0   thiamine 100 MG tablet Take 1 tablet (100 mg total) by mouth daily. 30 tablet 0   Vitamin D3 (VITAMIN D) 25 MCG tablet Take 1 tablet (1,000 Units total) by mouth daily. 30 tablet 0   Zinc Sulfate 220 (50 Zn) MG TABS Take 1 tablet (220 mg total) by mouth daily. 30 tablet 0   No current facility-administered medications for this visit.   Facility-Administered Medications Ordered in Other Visits  Medication Dose Route Frequency Provider Last Rate Last Admin   lactated ringers infusion    Continuous PRN Leonor Liv, CRNA   New Bag at 03/09/19 0800   lactated ringers infusion    Continuous PRN Leonor Liv, CRNA   New Bag at 03/09/19 2025   Allergies  Allergen Reactions   Contrast Media [Iodinated Contrast Media] Itching and Swelling    Needs 13-hr prep   Latex Itching, Swelling and Other (See Comments)    No breathing impairment, however   Sulfa Antibiotics Itching, Swelling and Other (See Comments)    No breathing impairment, however     Review of Systems: All systems reviewed and negative except where noted in HPI.    DG Abd 1 View  Result Date: 10/23/2021 CLINICAL DATA:  Abdomen pain EXAM: ABDOMEN - 1 VIEW COMPARISON:  None Available. FINDINGS: The bowel gas pattern is normal. No radio-opaque calculi or other significant radiographic abnormality are seen. IUD in the pelvis. Hardware in the lumbar spine. IMPRESSION: Negative. Electronically Signed   By: Donavan Foil M.D.   On: 10/23/2021 22:09   IR Tips  Result Date: 11/02/2021 CLINICAL DATA:  56 year old female with a history of alcoholic cirrhosis complicated by recurrent large volume ascites and hepatic hydrothorax. She presents for paracentesis, thoracentesis and TIPS creation. EXAM: 1.  Ultrasound-guided paracentesis 2. Ultrasound-guided right thoracentesis 3. Ultrasound-guided right common femoral venous access for introduction of intravascular ultrasound 4. TIPS creation MEDICATIONS: As antibiotic prophylaxis, 1 g  Rocephin was ordered pre-procedure and administered intravenously within one hour of incision. ANESTHESIA/SEDATION: General - as administered by the Anesthesia department CONTRAST:  110 mL Omnipaque 300 FLUOROSCOPY TIME:  Radiation exposure index: 461 mGy reference air kerma COMPLICATIONS: None immediate. PROCEDURE: Informed written consent was obtained from the patient after a thorough discussion of the procedural risks, benefits and alternatives. All questions were addressed. Maximal Sterile Barrier Technique was utilized including caps, mask, sterile gowns, sterile gloves, sterile drape, hand hygiene and skin antiseptic. A timeout was performed prior to the initiation of the procedure. PARACENTESIS The abdomen was interrogated with ultrasound. There is a large amount of free ascites. A suitable skin entry site was selected and marked. The region was sterilely prepped and draped in the standard fashion using chlorhexidine skin prep. Local anesthesia was attained by infiltration with 1% lidocaine. A small dermatotomy was made. Under ultrasound guidance, a 6 French Safe-T-Centesis catheter was advanced into the fluid collection. Paracentesis was then performed yielding approximately 3.5 L of ascites. The catheter was removed and a bandage applied. THORACENTESIS The right chest was interrogated with ultrasound. There is a large right-sided pleural effusion. A suitable skin entry site was selected and marked. The region was sterilely prepped and draped in the standard fashion using chlorhexidine skin prep. Local anesthesia was attained by infiltration with 1% lidocaine. A small dermatotomy was made. Under ultrasound guidance, a 6 French Safe-T-Centesis catheter was advanced into the right  pleural fluid. Thoracentesis was performed yielding 3 L of straw-colored pleural fluid. The catheter was removed. A bandage was applied. INTRAVASCULAR [place] The right common femoral vein was interrogated with ultrasound and found to be widely patent. An image was obtained and stored for the medical record. Local anesthesia was attained by infiltration with 1% lidocaine. A small dermatotomy was made. Under real-time sonographic guidance, the vessel was punctured with a 21 gauge micropuncture needle. Using standard technique, the initial micro needle was exchanged over a 0.018 micro wire for a transitional 4 [place] micro sheath. The micro sheath was then exchanged over a 0.035 wire for a 9 French vascular sheath. The 8 French intracardiac echocardiography (ICE) catheter was then advanced over a Bentson wire into the inferior cavoatrial junction. Intravascular ultrasound was then performed evaluating the supra hepatic and intra hepatic IVC, hepatic veins and portal veins. The portal veins are diminutive but patent. TIPS The right internal jugular vein was interrogated with ultrasound and found to be widely patent. An image was obtained and stored for the medical record. Local anesthesia was attained by infiltration with 1% lidocaine. A small dermatotomy was made. Under real-time sonographic guidance, the vessel was punctured with a 21 gauge micropuncture needle. Using standard technique, the initial micro needle was exchanged over a 0.018 micro wire for a transitional 4 [place] micro sheath. The micro sheath was then exchanged over a 0.035 wire for a 5 French vascular sheath. Attention was turned to the right abdomen. The abdomen was interrogated with ultrasound. The liver is shrunken and extremely heterogeneous. The gallbladder is mildly distended. There was great difficulty in identifying the diminutive portal veins. Several small portal venous radicles were accessed, however the trajectory would not facilitate passage  of a wire into the central portal venous system. Ultimately, using a combination of mid epigastric transabdominal ultrasound guidance, and a right lateral intra axillary needle approach, a 22 gauge needle was successfully advanced along a transhepatic course from lateral to medial and used to select a peripheral branch of the right portal vein. A  0.018 wire was navigated in the central portal veins. The needle was then exchanged and a 5 [place] Accustick sheath advanced over the needle and into the main portal vein. Transhepatic portal venography was then performed. The intrahepatic and main portal veins are markedly diminutive. The 5 French catheter is essentially occlusive in the right portal vein. There is marked hypertrophy of the posterior gastric vein filling a large portosystemic collateral. The Nitinol portion of the 0.018 wire was inserted with the tip at the portal venous confluence to provide an intra portal marker. Attention was turned back to the 5 French sheath in the right internal jugular vein. The sheath was aspirated and flushed. A wire was introduced and passed through the heart into the inferior vena cava. The 5 French sheath was removed. The skin tract was dilated to 10 [place] and a 9 [place] braided hydrophilic tips sheath advanced over the wire and positioned in the inferior vena cava. A 5 [place] MPA catheter was inserted coaxially through the sheath and the sheath was brought back into the right atrium. The catheter was then manipulated into the origin of the middle hepatic vein and the middle hepatic vein was selected over a Bentson wire. Once the catheter was in the middle hepatic vein, a hepatic venogram was performed confirming the venous anatomy. It is known that this represents the middle hepatic vein based on evaluation of prior CT imaging which demonstrates a diminutive and extremely posterior right hepatic vein. A superstiff Amplatz wire was advanced distally into the middle hepatic  vein. The tips sheath was advanced over the wire into the middle hepatic vein. Initially, and are gone scorpion tips set was advanced over the wire, through the sheath and into the middle hepatic vein. And attempts at puncture was made, however purchase within the middle hepatic vein is tenuous and the scorpion needle could not be advanced through the hepatic parenchyma without buckling out of the middle hepatic vein. Therefore, the scorpion system was removed. The standard Ring set and Col[place] needle were next introduced. A pass was made with colo pinto needle. On lateral imaging, it is clear that the middle hepatic vein and the colo pinto needle are quite posterior with respect to the right portal vein. It is obvious that it will be extremely challenging to make this pass naturally. Given the very small size of the liver, and the tenuous purchase within the middle hepatic vein there is concern for complication with multiple needle passes. Therefore, the decision was made to proceed with gun site technique to facilitate tips creation. The tips sheath was left in the middle hepatic vein but the colo pinto needle was removed. A 4 French 65 cm snare was advanced through the sheath and positioned in the middle hepatic vein. Similarly, a second 4 French 65 cm snare was advanced through the trans hepatic portal venous access and placed in the central right portal vein. The image intensifier was then angulated until the 2 snares overlapped. A 22 gauge 15 cm Chiba needle was then carefully advanced from the anterior abdominal wall using real-time fluoroscopic guidance through the liver and through both snares. A 300 cm glidewire advantage microwire was then advanced through the needle and the needle was retracted leaving the wire in place. The wire was then successfully snared with the snare located in the middle hepatic vein. The wire was brought back through the tips sheath and out the hub such that there was flossing  from the anterior abdominal wall through the  anterior liver, through the right portal vein, into the middle hepatic vein, up the sheath an out the IJ access. A coaxial system was then created using a 150 cm 0.014 CXI support catheter inside a 130 cm 0.035 CXI support catheter. The coaxial catheter system was then loaded over the 0.014 glidewire advantage catheter and advanced from the IJ access through the liver and out the anterior abdominal wall (only the 0.014 portion of the system was advanced through the anterior abdominal wall). The 0.035 CXI catheter was position with the radiopaque marker in the region of the right portal vein as identified by the intra portal snare. While maintaining through and through wire access, the 0.014 CXI support catheter was removed. A second 014 glidewire advantage wire was then advanced coaxially through the 0.035 catheter and navigated into the portal vein and down the SMV. Next, the through and through flossing wire was carefully removed. The 0.014 CXI support catheter was reintroduced over the new Glidewire Advantage wire coaxially through the 0.035 CXI support catheter. The catheter system was then carefully advanced through the access site in the right portal vein and into the central portal venous system. Contrast was injected confirming successful transhepatic portosystemic access. The 0.014 catheter and wire were removed. A superstiff Amplatz wire was advanced through the 035 catheter into the distal portal vein. The CXI support catheter was exchanged for a marker pigtail catheter. Portal venography was performed. Again, this confirms a large portosystemic collateral arising from the posterior gastric vein. Portal and systemic pressures were then measured. Portal pressure: 30 mm Hg Right atrial pressure: 10 mm Hg Portosystemic gradient: 20 mm Hg (severe portal hypertension) A 6 cm covered, 2 cm un covered Gore Viatorr stent graft was then selected. The superstiff Amplatz  wire was advanced through the marker pigtail catheter the pigtail catheter removed. The transhepatic tract was balloon dilated to 6 mm using a 6 x 40 mm mustang balloon. The tips sheath was then advanced over the balloon and into the main portal vein. At this time, maneuvers were performed in order to remove the snare around the sheath from the transhepatic portal venous access. To accomplish this, the tips sheath was pulled back to just within the transhepatic tract. The snare was closed around the existing superstiff Amplatz wire. A 5 French angled catheter and Glidewire were advanced coaxially through the tips sheath in the Glidewire and catheter system carefully passed into the main portal vein external to the closed snare. Portal venous access was confirmed by aspiration of blood and catheter injection. The glidewire was exchanged for a superstiff Amplatz wire. The initial superstiff Amplatz wire was then released from the snare and removed. This allowed successful capture and removal of the snare device. The tips sheath was readvanced over the superstiff Amplatz wire. The Viatorr stent graft was introduced through the sheath and the metal portion of the stent graph un covered. This deployed nicely within the main portal vein. The stent graft was then pulled back in till it was snug against the right portal venous entry site. The remainder of the stent graft was un sheath and then deployed. The stent graft was post dilated to 8 mm using an 8 x 40 mm Conquest balloon. The balloon was exchanged for the pigtail catheter which was reintroduced into the main portal vein. Repeat portal venography was performed. There is preferential flow through the newly created tips. The stent graft is widely patent and flow is brisk. There is decreased flow through the  large portal systemic collateral system. Pressures were remeasured. Portal pressure: 23 mm Hg Right atrial pressure: 18 mm Hg Portosystemic gradient: 5 mm Hg  (normal portal venous pressure). At this time, some consideration was made for proceeding with embolization of the hypertrophic posterior gastric vein and associated shunt system. However, flow appears to be preferential through the tips. The decision was made to defer embolization at this time. This could be embolized in the future if it becomes clinically symptomatic. The tips sheath was removed and hemostasis attained by manual pressure. The intravascular ultrasound device was removed and the right common femoral sheath removed and hemostasis attained by manual pressure. The transhepatic portal venous access sheath was removed. Bandages were applied all 3 sites. The patient tolerated the procedure well and was taken to PACU in stable condition. IMPRESSION: 1. Paracentesis yielding 3.5 L ascites. 2. Right thoracentesis yielding 3 L pleural fluid. 3. Successful creation of a right middle hepatic to right portal venous tips utilizing a 6 cm covered 2 cm uncovered Gore Viatorr stent graft post dilated to 8 mm. 4. Pre TIPS portosystemic gradient of 20 mmHg consistent with severe portal hypertension. Post TIPS portosystemic gradient of 5 mm Hg which is within normal limits. Signed, Criselda Peaches, MD, [facility] Radiology Electronically Signed   By: Jacqulynn Cadet M.D.   On: 11/02/2021 10:01   IR US Guide Vasc Access Right  Result Date: 11/02/2021 CLINICAL DATA:  56 year old female with a history of alcoholic cirrhosis complicated by recurrent large volume ascites and hepatic hydrothorax. She presents for paracentesis, thoracentesis and TIPS creation. EXAM: 1. Ultrasound-guided paracentesis 2. Ultrasound-guided right thoracentesis 3. Ultrasound-guided right common femoral venous access for introduction of intravascular ultrasound 4. TIPS creation MEDICATIONS: As antibiotic prophylaxis, 1 g Rocephin was ordered pre-procedure and administered  intravenously within one hour of incision. ANESTHESIA/SEDATION: General - as administered by the Anesthesia department CONTRAST:  110 mL Omnipaque 300 FLUOROSCOPY TIME:  Radiation exposure index: 461 mGy reference air kerma COMPLICATIONS: None immediate. PROCEDURE: Informed written consent was obtained from the patient after a thorough discussion of the procedural risks, benefits and alternatives. All questions were addressed. Maximal Sterile Barrier Technique was utilized including caps, mask, sterile gowns, sterile gloves, sterile drape, hand hygiene and skin antiseptic. A timeout was performed prior to the initiation of the procedure. PARACENTESIS The abdomen was interrogated with ultrasound. There is a large amount of free ascites. A suitable skin entry site was selected and marked. The region was sterilely prepped and draped in the standard fashion using chlorhexidine skin prep. Local anesthesia was attained by infiltration with 1% lidocaine. A small dermatotomy was made. Under ultrasound guidance, a 6 French Safe-T-Centesis catheter was advanced into the fluid collection. Paracentesis was then performed yielding approximately 3.5 L of ascites. The catheter was removed and a bandage applied. THORACENTESIS The right chest was interrogated with ultrasound. There is a large right-sided pleural effusion. A suitable skin entry site was selected and marked. The region was sterilely prepped and draped in the standard fashion using chlorhexidine skin prep. Local anesthesia was attained by infiltration with 1% lidocaine. A small dermatotomy was made. Under ultrasound guidance, a 6 French Safe-T-Centesis catheter was advanced into the right pleural fluid. Thoracentesis was performed yielding 3 L of straw-colored pleural fluid. The catheter was removed. A bandage was applied. INTRAVASCULAR [place] The right common femoral vein was interrogated with ultrasound and found to be widely patent. An image was obtained and stored for  the  medical record. Local anesthesia was attained by infiltration with 1% lidocaine. A small dermatotomy was made. Under real-time sonographic guidance, the vessel was punctured with a 21 gauge micropuncture needle. Using standard technique, the initial micro needle was exchanged over a 0.018 micro wire for a transitional 4 [place] micro sheath. The micro sheath was then exchanged over a 0.035 wire for a 9 French vascular sheath. The 8 French intracardiac echocardiography (ICE) catheter was then advanced over a Bentson wire into the inferior cavoatrial junction. Intravascular ultrasound was then performed evaluating the supra hepatic and intra hepatic IVC, hepatic veins and portal veins. The portal veins are diminutive but patent. TIPS The right internal jugular vein was interrogated with ultrasound and found to be widely patent. An image was obtained and stored for the medical record. Local anesthesia was attained by infiltration with 1% lidocaine. A small dermatotomy was made. Under real-time sonographic guidance, the vessel was punctured with a 21 gauge micropuncture needle. Using standard technique, the initial micro needle was exchanged over a 0.018 micro wire for a transitional 4 [place] micro sheath. The micro sheath was then exchanged over a 0.035 wire for a 5 French vascular sheath. Attention was turned to the right abdomen. The abdomen was interrogated with ultrasound. The liver is shrunken and extremely heterogeneous. The gallbladder is mildly distended. There was great difficulty in identifying the diminutive portal veins. Several small portal venous radicles were accessed, however the trajectory would not facilitate passage of a wire into the central portal venous system. Ultimately, using a combination of mid epigastric transabdominal ultrasound guidance, and a right lateral intra axillary needle approach, a 22 gauge needle was successfully advanced along a transhepatic course from lateral to medial and  used to select a peripheral branch of the right portal vein. A 0.018 wire was navigated in the central portal veins. The needle was then exchanged and a 5 [place] Accustick sheath advanced over the needle and into the main portal vein. Transhepatic portal venography was then performed. The intrahepatic and main portal veins are markedly diminutive. The 5 French catheter is essentially occlusive in the right portal vein. There is marked hypertrophy of the posterior gastric vein filling a large portosystemic collateral. The Nitinol portion of the 0.018 wire was inserted with the tip at the portal venous confluence to provide an intra portal marker. Attention was turned back to the 5 French sheath in the right internal jugular vein. The sheath was aspirated and flushed. A wire was introduced and passed through the heart into the inferior vena cava. The 5 French sheath was removed. The skin tract was dilated to 10 [place] and a 9 [place] braided hydrophilic tips sheath advanced over the wire and positioned in the inferior vena cava. A 5 [place] MPA catheter was inserted coaxially through the sheath and the sheath was brought back into the right atrium. The catheter was then manipulated into the origin of the middle hepatic vein and the middle hepatic vein was selected over a Bentson wire. Once the catheter was in the middle hepatic vein, a hepatic venogram was performed confirming the venous anatomy. It is known that this represents the middle hepatic vein based on evaluation of prior CT imaging which demonstrates a diminutive and extremely posterior right hepatic vein. A superstiff Amplatz wire was advanced distally into the middle hepatic vein. The tips sheath was advanced over the wire into the middle hepatic vein. Initially, and are gone scorpion tips set was advanced over the wire, through the sheath and  into the middle hepatic vein. And attempts at puncture was made, however purchase within the middle hepatic vein is  tenuous and the scorpion needle could not be advanced through the hepatic parenchyma without buckling out of the middle hepatic vein. Therefore, the scorpion system was removed. The standard Ring set and Colopinto needle were next introduced. A pass was made with colo pinto needle. On lateral imaging, it is clear that the middle hepatic vein and the colo pinto needle are quite posterior with respect to the right portal vein. It is obvious that it will be extremely challenging to make this pass naturally. Given the very small size of the liver, and the tenuous purchase within the middle hepatic vein there is concern for complication with multiple needle passes. Therefore, the decision was made to proceed with gun site technique to facilitate tips creation. The tips sheath was left in the middle hepatic vein but the colo pinto needle was removed. A 4 French 65 cm snare was advanced through the sheath and positioned in the middle hepatic vein. Similarly, a second 4 French 65 cm snare was advanced through the trans hepatic portal venous access and placed in the central right portal vein. The image intensifier was then angulated until the 2 snares overlapped. A 22 gauge 15 cm Chiba needle was then carefully advanced from the anterior abdominal wall using real-time fluoroscopic guidance through the liver and through both snares. A 300 cm glidewire advantage microwire was then advanced through the needle and the needle was retracted leaving the wire in place. The wire was then successfully snared with the snare located in the middle hepatic vein. The wire was brought back through the tips sheath and out the hub such that there was flossing from the anterior abdominal wall through the anterior liver, through the right portal vein, into the middle hepatic vein, up the sheath an out the IJ access. A coaxial system was then created using a 150 cm 0.014 CXI support catheter inside a 130 cm 0.035 CXI support catheter. The  coaxial catheter system was then loaded over the 0.014 glidewire advantage catheter and advanced from the IJ access through the liver and out the anterior abdominal wall (only the 0.014 portion of the system was advanced through the anterior abdominal wall). The 0.035 CXI catheter was position with the radiopaque marker in the region of the right portal vein as identified by the intra portal snare. While maintaining through and through wire access, the 0.014 CXI support catheter was removed. A second 014 glidewire advantage wire was then advanced coaxially through the 0.035 catheter and navigated into the portal vein and down the SMV. Next, the through and through flossing wire was carefully removed. The 0.014 CXI support catheter was reintroduced over the new Glidewire Advantage wire coaxially through the 0.035 CXI support catheter. The catheter system was then carefully advanced through the access site in the right portal vein and into the central portal venous system. Contrast was injected confirming successful transhepatic portosystemic access. The 0.014 catheter and wire were removed. A superstiff Amplatz wire was advanced through the 035 catheter into the distal portal vein. The CXI support catheter was exchanged for a marker pigtail catheter. Portal venography was performed. Again, this confirms a large portosystemic collateral arising from the posterior gastric vein. Portal and systemic pressures were then measured. Portal pressure: 30 mm Hg Right atrial pressure: 10 mm Hg Portosystemic gradient: 20 mm Hg (severe portal hypertension) A 6 cm covered, 2 cm un covered [employer]  Viatorr stent graft was then selected. The superstiff Amplatz wire was advanced through the marker pigtail catheter the pigtail catheter removed. The transhepatic tract was balloon dilated to 6 mm using a 6 x 40 mm mustang balloon. The tips sheath was then advanced over the balloon and into the main portal vein. At this time, maneuvers were  performed in order to remove the snare around the sheath from the transhepatic portal venous access. To accomplish this, the tips sheath was pulled back to just within the transhepatic tract. The snare was closed around the existing superstiff Amplatz wire. A 5 French angled catheter and Glidewire were advanced coaxially through the tips sheath in the Glidewire and catheter system carefully passed into the main portal vein external to the closed snare. Portal venous access was confirmed by aspiration of blood and catheter injection. The glidewire was exchanged for a superstiff Amplatz wire. The initial superstiff Amplatz wire was then released from the snare and removed. This allowed successful capture and removal of the snare device. The tips sheath was readvanced over the superstiff Amplatz wire. The Viatorr stent graft was introduced through the sheath and the metal portion of the stent graph un covered. This deployed nicely within the main portal vein. The stent graft was then pulled back in till it was snug against the right portal venous entry site. The remainder of the stent graft was un sheath and then deployed. The stent graft was post dilated to 8 mm using an 8 x 40 mm Conquest balloon. The balloon was exchanged for the pigtail catheter which was reintroduced into the main portal vein. Repeat portal venography was performed. There is preferential flow through the newly created tips. The stent graft is widely patent and flow is brisk. There is decreased flow through the large portal systemic collateral system. Pressures were remeasured. Portal pressure: 23 mm Hg Right atrial pressure: 18 mm Hg Portosystemic gradient: 5 mm Hg (normal portal venous pressure). At this time, some consideration was made for proceeding with embolization of the hypertrophic posterior gastric vein and associated shunt system. However, flow appears to be preferential through the tips. The decision was made to defer embolization at  this time. This could be embolized in the future if it becomes clinically symptomatic. The tips sheath was removed and hemostasis attained by manual pressure. The intravascular ultrasound device was removed and the right common femoral sheath removed and hemostasis attained by manual pressure. The transhepatic portal venous access sheath was removed. Bandages were applied all 3 sites. The patient tolerated the procedure well and was taken to PACU in stable condition. IMPRESSION: 1. Paracentesis yielding 3.5 L ascites. 2. Right thoracentesis yielding 3 L pleural fluid. 3. Successful creation of a right middle hepatic to right portal venous tips utilizing a 6 cm covered 2 cm uncovered [place] Viatorr stent graft post dilated to 8 mm. 4. Pre TIPS portosystemic gradient of 20 mmHg consistent with severe portal hypertension. Post TIPS portosystemic gradient of 5 mm Hg which is within normal limits. Signed, Criselda Peaches, MD, [place] [facility] Radiology Electronically Signed   By: Jacqulynn Cadet M.D.   On: 11/02/2021 10:01   IR US Guide Vasc Access Right  Result Date: 11/02/2021 CLINICAL DATA:  56 year old female with a history of alcoholic cirrhosis complicated by recurrent large volume ascites and hepatic hydrothorax. She presents for paracentesis, thoracentesis and TIPS creation. EXAM: 1. Ultrasound-guided paracentesis 2. Ultrasound-guided right thoracentesis 3. Ultrasound-guided right common femoral venous access for  introduction of intravascular ultrasound 4. TIPS creation MEDICATIONS: As antibiotic prophylaxis, 1 g Rocephin was ordered pre-procedure and administered intravenously within one hour of incision. ANESTHESIA/SEDATION: General - as administered by the Anesthesia department CONTRAST:  110 mL Omnipaque 300 FLUOROSCOPY TIME:  Radiation exposure index: 461 mGy reference air kerma COMPLICATIONS: None immediate. PROCEDURE: Informed written consent was  obtained from the patient after a thorough discussion of the procedural risks, benefits and alternatives. All questions were addressed. Maximal Sterile Barrier Technique was utilized including caps, mask, sterile gowns, sterile gloves, sterile drape, hand hygiene and skin antiseptic. A timeout was performed prior to the initiation of the procedure. PARACENTESIS The abdomen was interrogated with ultrasound. There is a large amount of free ascites. A suitable skin entry site was selected and marked. The region was sterilely prepped and draped in the standard fashion using chlorhexidine skin prep. Local anesthesia was attained by infiltration with 1% lidocaine. A small dermatotomy was made. Under ultrasound guidance, a 6 French Safe-T-Centesis catheter was advanced into the fluid collection. Paracentesis was then performed yielding approximately 3.5 L of ascites. The catheter was removed and a bandage applied. THORACENTESIS The right chest was interrogated with ultrasound. There is a large right-sided pleural effusion. A suitable skin entry site was selected and marked. The region was sterilely prepped and draped in the standard fashion using chlorhexidine skin prep. Local anesthesia was attained by infiltration with 1% lidocaine. A small dermatotomy was made. Under ultrasound guidance, a 6 French Safe-T-Centesis catheter was advanced into the right pleural fluid. Thoracentesis was performed yielding 3 L of straw-colored pleural fluid. The catheter was removed. A bandage was applied. INTRAVASCULAR [place] The right common femoral vein was interrogated with ultrasound and found to be widely patent. An image was obtained and stored for the medical record. Local anesthesia was attained by infiltration with 1% lidocaine. A small dermatotomy was made. Under real-time sonographic guidance, the vessel was punctured with a 21 gauge micropuncture needle. Using standard technique, the initial micro needle was exchanged over a 0.018  micro wire for a transitional 4 [place] micro sheath. The micro sheath was then exchanged over a 0.035 wire for a 9 French vascular sheath. The 8 French intracardiac echocardiography (ICE) catheter was then advanced over a Bentson wire into the inferior cavoatrial junction. Intravascular ultrasound was then performed evaluating the supra hepatic and intra hepatic IVC, hepatic veins and portal veins. The portal veins are diminutive but patent. TIPS The right internal jugular vein was interrogated with ultrasound and found to be widely patent. An image was obtained and stored for the medical record. Local anesthesia was attained by infiltration with 1% lidocaine. A small dermatotomy was made. Under real-time sonographic guidance, the vessel was punctured with a 21 gauge micropuncture needle. Using standard technique, the initial micro needle was exchanged over a 0.018 micro wire for a transitional 4 [place] micro sheath. The micro sheath was then exchanged over a 0.035 wire for a 5 French vascular sheath. Attention was turned to the right abdomen. The abdomen was interrogated with ultrasound. The liver is shrunken and extremely heterogeneous. The gallbladder is mildly distended. There was great difficulty in identifying the diminutive portal veins. Several small portal venous radicles were accessed, however the trajectory would not facilitate passage of a wire into the central portal venous system. Ultimately, using a combination of mid epigastric transabdominal ultrasound guidance, and a right lateral intra axillary needle approach, a 22 gauge needle was successfully advanced along a transhepatic course from lateral to medial  and used to select a peripheral branch of the right portal vein. A 0.018 wire was navigated in the central portal veins. The needle was then exchanged and a 5 [place] Accustick sheath advanced over the needle and into the main portal vein. Transhepatic portal venography was then performed. The  intrahepatic and main portal veins are markedly diminutive. The 5 French catheter is essentially occlusive in the right portal vein. There is marked hypertrophy of the posterior gastric vein filling a large portosystemic collateral. The Nitinol portion of the 0.018 wire was inserted with the tip at the portal venous confluence to provide an intra portal marker. Attention was turned back to the 5 French sheath in the right internal jugular vein. The sheath was aspirated and flushed. A wire was introduced and passed through the heart into the inferior vena cava. The 5 French sheath was removed. The skin tract was dilated to 10 [place] and a 9 [place] braided hydrophilic tips sheath advanced over the wire and positioned in the inferior vena cava. A 5 [place] MPA catheter was inserted coaxially through the sheath and the sheath was brought back into the right atrium. The catheter was then manipulated into the origin of the middle hepatic vein and the middle hepatic vein was selected over a Bentson wire. Once the catheter was in the middle hepatic vein, a hepatic venogram was performed confirming the venous anatomy. It is known that this represents the middle hepatic vein based on evaluation of prior CT imaging which demonstrates a diminutive and extremely posterior right hepatic vein. A superstiff Amplatz wire was advanced distally into the middle hepatic vein. The tips sheath was advanced over the wire into the middle hepatic vein. Initially, and are gone scorpion tips set was advanced over the wire, through the sheath and into the middle hepatic vein. And attempts at puncture was made, however purchase within the middle hepatic vein is tenuous and the scorpion needle could not be advanced through the hepatic parenchyma without buckling out of the middle hepatic vein. Therefore, the scorpion system was removed. The standard Ring set and Colopinto needle were next introduced. A pass was made with colo pinto needle. On  lateral imaging, it is clear that the middle hepatic vein and the colo pinto needle are quite posterior with respect to the right portal vein. It is obvious that it will be extremely challenging to make this pass naturally. Given the very small size of the liver, and the tenuous purchase within the middle hepatic vein there is concern for complication with multiple needle passes. Therefore, the decision was made to proceed with gun site technique to facilitate tips creation. The tips sheath was left in the middle hepatic vein but the colo pinto needle was removed. A 4 French 65 cm snare was advanced through the sheath and positioned in the middle hepatic vein. Similarly, a second 4 French 65 cm snare was advanced through the trans hepatic portal venous access and placed in the central right portal vein. The image intensifier was then angulated until the 2 snares overlapped. A 22 gauge 15 cm Chiba needle was then carefully advanced from the anterior abdominal wall using real-time fluoroscopic guidance through the liver and through both snares. A 300 cm glidewire advantage microwire was then advanced through the needle and the needle was retracted leaving the wire in place. The wire was then successfully snared with the snare located in the middle hepatic vein. The wire was brought back through the tips sheath and out the  hub such that there was flossing from the anterior abdominal wall through the anterior liver, through the right portal vein, into the middle hepatic vein, up the sheath an out the IJ access. A coaxial system was then created using a 150 cm 0.014 CXI support catheter inside a 130 cm 0.035 CXI support catheter. The coaxial catheter system was then loaded over the 0.014 glidewire advantage catheter and advanced from the IJ access through the liver and out the anterior abdominal wall (only the 0.014 portion of the system was advanced through the anterior abdominal wall). The 0.035 CXI catheter was  position with the radiopaque marker in the region of the right portal vein as identified by the intra portal snare. While maintaining through and through wire access, the 0.014 CXI support catheter was removed. A second 014 glidewire advantage wire was then advanced coaxially through the 0.035 catheter and navigated into the portal vein and down the SMV. Next, the through and through flossing wire was carefully removed. The 0.014 CXI support catheter was reintroduced over the new Glidewire Advantage wire coaxially through the 0.035 CXI support catheter. The catheter system was then carefully advanced through the access site in the right portal vein and into the central portal venous system. Contrast was injected confirming successful transhepatic portosystemic access. The 0.014 catheter and wire were removed. A superstiff Amplatz wire was advanced through the 035 catheter into the distal portal vein. The CXI support catheter was exchanged for a marker pigtail catheter. Portal venography was performed. Again, this confirms a large portosystemic collateral arising from the posterior gastric vein. Portal and systemic pressures were then measured. Portal pressure: 30 mm Hg Right atrial pressure: 10 mm Hg Portosystemic gradient: 20 mm Hg (severe portal hypertension) A 6 cm covered, 2 cm un covered Gore Viatorr stent graft was then selected. The superstiff Amplatz wire was advanced through the marker pigtail catheter the pigtail catheter removed. The transhepatic tract was balloon dilated to 6 mm using a 6 x 40 mm mustang balloon. The tips sheath was then advanced over the balloon and into the main portal vein. At this time, maneuvers were performed in order to remove the snare around the sheath from the transhepatic portal venous access. To accomplish this, the tips sheath was pulled back to just within the transhepatic tract. The snare was closed around the existing superstiff Amplatz wire. A 5 French angled catheter  and Glidewire were advanced coaxially through the tips sheath in the Glidewire and catheter system carefully passed into the main portal vein external to the closed snare. Portal venous access was confirmed by aspiration of blood and catheter injection. The glidewire was exchanged for a superstiff Amplatz wire. The initial superstiff Amplatz wire was then released from the snare and removed. This allowed successful capture and removal of the snare device. The tips sheath was readvanced over the superstiff Amplatz wire. The Viatorr stent graft was introduced through the sheath and the metal portion of the stent graph un covered. This deployed nicely within the main portal vein. The stent graft was then pulled back in till it was snug against the right portal venous entry site. The remainder of the stent graft was un sheath and then deployed. The stent graft was post dilated to 8 mm using an 8 x 40 mm Conquest balloon. The balloon was exchanged for the pigtail catheter which was reintroduced into the main portal vein. Repeat portal venography was performed. There is preferential flow through the newly created tips. The stent  graft is widely patent and flow is brisk. There is decreased flow through the large portal systemic collateral system. Pressures were remeasured. Portal pressure: 23 mm Hg Right atrial pressure: 18 mm Hg Portosystemic gradient: 5 mm Hg (normal portal venous pressure). At this time, some consideration was made for proceeding with embolization of the hypertrophic posterior gastric vein and associated shunt system. However, flow appears to be preferential through the tips. The decision was made to defer embolization at this time. This could be embolized in the future if it becomes clinically symptomatic. The tips sheath was removed and hemostasis attained by manual pressure. The intravascular ultrasound device was removed and the right common femoral sheath removed and hemostasis attained by manual  pressure. The transhepatic portal venous access sheath was removed. Bandages were applied all 3 sites. The patient tolerated the procedure well and was taken to PACU in stable condition. IMPRESSION: 1. Paracentesis yielding 3.5 L ascites. 2. Right thoracentesis yielding 3 L pleural fluid. 3. Successful creation of a right middle hepatic to right portal venous tips utilizing a 6 cm covered 2 cm uncovered Gore Viatorr stent graft post dilated to 8 mm. 4. Pre TIPS portosystemic gradient of 20 mmHg consistent with severe portal hypertension. Post TIPS portosystemic gradient of 5 mm Hg which is within normal limits. Signed, Criselda Peaches, MD, [facility] Radiology Electronically Signed   By: Jacqulynn Cadet M.D.   On: 11/02/2021 10:01   IR US Guide Vasc Access Right  Result Date: 11/02/2021 CLINICAL DATA:  56 year old female with a history of alcoholic cirrhosis complicated by recurrent large volume ascites and hepatic hydrothorax. She presents for paracentesis, thoracentesis and TIPS creation. EXAM: 1. Ultrasound-guided paracentesis 2. Ultrasound-guided right thoracentesis 3. Ultrasound-guided right common femoral venous access for introduction of intravascular ultrasound 4. TIPS creation MEDICATIONS: As antibiotic prophylaxis, 1 g Rocephin was ordered pre-procedure and administered intravenously within one hour of incision. ANESTHESIA/SEDATION: General - as administered by the Anesthesia department CONTRAST:  110 mL Omnipaque 300 FLUOROSCOPY TIME:  Radiation exposure index: 461 mGy reference air kerma COMPLICATIONS: None immediate. PROCEDURE: Informed written consent was obtained from the patient after a thorough discussion of the procedural risks, benefits and alternatives. All questions were addressed. Maximal Sterile Barrier Technique was utilized including caps, mask, sterile gowns, sterile gloves, sterile drape, hand hygiene and skin antiseptic. A  timeout was performed prior to the initiation of the procedure. PARACENTESIS The abdomen was interrogated with ultrasound. There is a large amount of free ascites. A suitable skin entry site was selected and marked. The region was sterilely prepped and draped in the standard fashion using chlorhexidine skin prep. Local anesthesia was attained by infiltration with 1% lidocaine. A small dermatotomy was made. Under ultrasound guidance, a 6 French Safe-T-Centesis catheter was advanced into the fluid collection. Paracentesis was then performed yielding approximately 3.5 L of ascites. The catheter was removed and a bandage applied. THORACENTESIS The right chest was interrogated with ultrasound. There is a large right-sided pleural effusion. A suitable skin entry site was selected and marked. The region was sterilely prepped and draped in the standard fashion using chlorhexidine skin prep. Local anesthesia was attained by infiltration with 1% lidocaine. A small dermatotomy was made. Under ultrasound guidance, a 6 French Safe-T-Centesis catheter was advanced into the right pleural fluid. Thoracentesis was performed yielding 3 L of straw-colored pleural fluid. The catheter was removed. A bandage was applied. INTRAVASCULAR [place] The right common femoral vein was interrogated with ultrasound and  found to be widely patent. An image was obtained and stored for the medical record. Local anesthesia was attained by infiltration with 1% lidocaine. A small dermatotomy was made. Under real-time sonographic guidance, the vessel was punctured with a 21 gauge micropuncture needle. Using standard technique, the initial micro needle was exchanged over a 0.018 micro wire for a transitional 4 [place] micro sheath. The micro sheath was then exchanged over a 0.035 wire for a 9 French vascular sheath. The 8 French intracardiac echocardiography (ICE) catheter was then advanced over a Bentson wire into the inferior cavoatrial junction. Intravascular  ultrasound was then performed evaluating the supra hepatic and intra hepatic IVC, hepatic veins and portal veins. The portal veins are diminutive but patent. TIPS The right internal jugular vein was interrogated with ultrasound and found to be widely patent. An image was obtained and stored for the medical record. Local anesthesia was attained by infiltration with 1% lidocaine. A small dermatotomy was made. Under real-time sonographic guidance, the vessel was punctured with a 21 gauge micropuncture needle. Using standard technique, the initial micro needle was exchanged over a 0.018 micro wire for a transitional 4 [place] micro sheath. The micro sheath was then exchanged over a 0.035 wire for a 5 French vascular sheath. Attention was turned to the right abdomen. The abdomen was interrogated with ultrasound. The liver is shrunken and extremely heterogeneous. The gallbladder is mildly distended. There was great difficulty in identifying the diminutive portal veins. Several small portal venous radicles were accessed, however the trajectory would not facilitate passage of a wire into the central portal venous system. Ultimately, using a combination of mid epigastric transabdominal ultrasound guidance, and a right lateral intra axillary needle approach, a 22 gauge needle was successfully advanced along a transhepatic course from lateral to medial and used to select a peripheral branch of the right portal vein. A 0.018 wire was navigated in the central portal veins. The needle was then exchanged and a 5 [place] Accustick sheath advanced over the needle and into the main portal vein. Transhepatic portal venography was then performed. The intrahepatic and main portal veins are markedly diminutive. The 5 French catheter is essentially occlusive in the right portal vein. There is marked hypertrophy of the posterior gastric vein filling a large portosystemic collateral. The Nitinol portion of the 0.018 wire was inserted with the  tip at the portal venous confluence to provide an intra portal marker. Attention was turned back to the 5 French sheath in the right internal jugular vein. The sheath was aspirated and flushed. A wire was introduced and passed through the heart into the inferior vena cava. The 5 French sheath was removed. The skin tract was dilated to 10 [place] and a 9 [place] braided hydrophilic tips sheath advanced over the wire and positioned in the inferior vena cava. A 5 [place] MPA catheter was inserted coaxially through the sheath and the sheath was brought back into the right atrium. The catheter was then manipulated into the origin of the middle hepatic vein and the middle hepatic vein was selected over a Bentson wire. Once the catheter was in the middle hepatic vein, a hepatic venogram was performed confirming the venous anatomy. It is known that this represents the middle hepatic vein based on evaluation of prior CT imaging which demonstrates a diminutive and extremely posterior right hepatic vein. A superstiff Amplatz wire was advanced distally into the middle hepatic vein. The tips sheath was advanced over the wire into the middle hepatic vein. Initially, and  are gone scorpion tips set was advanced over the wire, through the sheath and into the middle hepatic vein. And attempts at puncture was made, however purchase within the middle hepatic vein is tenuous and the scorpion needle could not be advanced through the hepatic parenchyma without buckling out of the middle hepatic vein. Therefore, the scorpion system was removed. The standard Ring set and Colopinto needle were next introduced. A pass was made with colo pinto needle. On lateral imaging, it is clear that the middle hepatic vein and the colo pinto needle are quite posterior with respect to the right portal vein. It is obvious that it will be extremely challenging to make this pass naturally. Given the very small size of the liver, and the tenuous purchase within  the middle hepatic vein there is concern for complication with multiple needle passes. Therefore, the decision was made to proceed with gun site technique to facilitate tips creation. The tips sheath was left in the middle hepatic vein but the colo pinto needle was removed. A 4 French 65 cm snare was advanced through the sheath and positioned in the middle hepatic vein. Similarly, a second 4 French 65 cm snare was advanced through the trans hepatic portal venous access and placed in the central right portal vein. The image intensifier was then angulated until the 2 snares overlapped. A 22 gauge 15 cm Chiba needle was then carefully advanced from the anterior abdominal wall using real-time fluoroscopic guidance through the liver and through both snares. A 300 cm glidewire advantage microwire was then advanced through the needle and the needle was retracted leaving the wire in place. The wire was then successfully snared with the snare located in the middle hepatic vein. The wire was brought back through the tips sheath and out the hub such that there was flossing from the anterior abdominal wall through the anterior liver, through the right portal vein, into the middle hepatic vein, up the sheath an out the IJ access. A coaxial system was then created using a 150 cm 0.014 CXI support catheter inside a 130 cm 0.035 CXI support catheter. The coaxial catheter system was then loaded over the 0.014 glidewire advantage catheter and advanced from the IJ access through the liver and out the anterior abdominal wall (only the 0.014 portion of the system was advanced through the anterior abdominal wall). The 0.035 CXI catheter was position with the radiopaque marker in the region of the right portal vein as identified by the intra portal snare. While maintaining through and through wire access, the 0.014 CXI support catheter was removed. A second 014 glidewire advantage wire was then advanced coaxially through the 0.035  catheter and navigated into the portal vein and down the SMV. Next, the through and through flossing wire was carefully removed. The 0.014 CXI support catheter was reintroduced over the new Glidewire Advantage wire coaxially through the 0.035 CXI support catheter. The catheter system was then carefully advanced through the access site in the right portal vein and into the central portal venous system. Contrast was injected confirming successful transhepatic portosystemic access. The 0.014 catheter and wire were removed. A superstiff Amplatz wire was advanced through the 035 catheter into the distal portal vein. The CXI support catheter was exchanged for a marker pigtail catheter. Portal venography was performed. Again, this confirms a large portosystemic collateral arising from the posterior gastric vein. Portal and systemic pressures were then measured. Portal pressure: 30 mm Hg Right atrial pressure: 10 mm Hg Portosystemic gradient: 20  mm Hg (severe portal hypertension) A 6 cm covered, 2 cm un covered Gore Viatorr stent graft was then selected. The superstiff Amplatz wire was advanced through the marker pigtail catheter the pigtail catheter removed. The transhepatic tract was balloon dilated to 6 mm using a 6 x 40 mm mustang balloon. The tips sheath was then advanced over the balloon and into the main portal vein. At this time, maneuvers were performed in order to remove the snare around the sheath from the transhepatic portal venous access. To accomplish this, the tips sheath was pulled back to just within the transhepatic tract. The snare was closed around the existing superstiff Amplatz wire. A 5 French angled catheter and Glidewire were advanced coaxially through the tips sheath in the Glidewire and catheter system carefully passed into the main portal vein external to the closed snare. Portal venous access was confirmed by aspiration of blood and catheter injection. The glidewire was exchanged for a  superstiff Amplatz wire. The initial superstiff Amplatz wire was then released from the snare and removed. This allowed successful capture and removal of the snare device. The tips sheath was readvanced over the superstiff Amplatz wire. The Viatorr stent graft was introduced through the sheath and the metal portion of the stent graph un covered. This deployed nicely within the main portal vein. The stent graft was then pulled back in till it was snug against the right portal venous entry site. The remainder of the stent graft was un sheath and then deployed. The stent graft was post dilated to 8 mm using an 8 x 40 mm Conquest balloon. The balloon was exchanged for the pigtail catheter which was reintroduced into the main portal vein. Repeat portal venography was performed. There is preferential flow through the newly created tips. The stent graft is widely patent and flow is brisk. There is decreased flow through the large portal systemic collateral system. Pressures were remeasured. Portal pressure: 23 mm Hg Right atrial pressure: 18 mm Hg Portosystemic gradient: 5 mm Hg (normal portal venous pressure). At this time, some consideration was made for proceeding with embolization of the hypertrophic posterior gastric vein and associated shunt system. However, flow appears to be preferential through the tips. The decision was made to defer embolization at this time. This could be embolized in the future if it becomes clinically symptomatic. The tips sheath was removed and hemostasis attained by manual pressure. The intravascular ultrasound device was removed and the right common femoral sheath removed and hemostasis attained by manual pressure. The transhepatic portal venous access sheath was removed. Bandages were applied all 3 sites. The patient tolerated the procedure well and was taken to PACU in stable condition. IMPRESSION: 1. Paracentesis yielding 3.5 L ascites. 2. Right thoracentesis yielding 3 L pleural fluid.  3. Successful creation of a right middle hepatic to right portal venous tips utilizing a 6 cm covered 2 cm uncovered Gore Viatorr stent graft post dilated to 8 mm. 4. Pre TIPS portosystemic gradient of 20 mmHg consistent with severe portal hypertension. Post TIPS portosystemic gradient of 5 mm Hg which is within normal limits. Signed, Criselda Peaches, MD, [facility] Radiology Electronically Signed   By: Jacqulynn Cadet M.D.   On: 11/02/2021 10:01   DG CHEST PORT 1 VIEW  Result Date: 11/08/2021 CLINICAL DATA:  Shortness of breath EXAM: PORTABLE CHEST 1 VIEW COMPARISON:  Previous day FINDINGS: Cardiomediastinal silhouette is within normal limits. Small right pleural effusion. No pneumothorax. No lobar consolidation.  A TIPS stent is present. IMPRESSION: Small right pleural effusion. Electronically Signed   By: Albin Felling M.D.   On: 11/08/2021 08:54   DG Chest Port 1 View  Result Date: 11/07/2021 CLINICAL DATA:  Chest tube removal EXAM: PORTABLE CHEST 1 VIEW COMPARISON:  11/07/2021 1:53 p.m. FINDINGS: Previously noted right pleural pigtail catheter is no longer seen. Possible trace right pneumothorax. Right basilar atelectasis. No new focal pulmonary opacity. No pleural effusion. No acute osseous abnormality. Normal cardiac and mediastinal contours. IMPRESSION: Interval removal of right pleural pigtail catheter, with possible trace right pneumothorax. Electronically Signed   By: Merilyn Baba M.D.   On: 11/07/2021 17:29   DG CHEST PORT 1 VIEW  Result Date: 11/07/2021 CLINICAL DATA:  Follow-up pneumothorax. EXAM: PORTABLE CHEST 1 VIEW COMPARISON:  Multiple previous chest x-rays. FINDINGS: The right-sided chest tube is stable. There is a very small residual right apical pneumothorax. Streaky right basilar atelectasis. The left lung remains clear. IMPRESSION: Very small residual right apical pneumothorax. Electronically Signed   By: Marijo Sanes  M.D.   On: 11/07/2021 14:03   DG Chest Port 1 View  Result Date: 11/07/2021 CLINICAL DATA:  Chest tube placement.  Shortness of breath. EXAM: PORTABLE CHEST 1 VIEW COMPARISON:  Chest radiograph 11/06/2021 FINDINGS: The cardiomediastinal silhouette is unchanged with normal heart size. A right-sided chest tube remains in place. There is a persistent trace right apical pneumothorax. Mild right basilar opacity has slightly increased and likely reflects atelectasis. No sizable pleural effusion is evident. IMPRESSION: Persistent trace right apical pneumothorax with chest tube in place. Mild right basilar atelectasis. Electronically Signed   By: Logan Bores M.D.   On: 11/07/2021 08:31   DG Chest Port 1 View  Result Date: 11/06/2021 CLINICAL DATA:  Pneumothorax with right chest tube in place. EXAM: PORTABLE CHEST 1 VIEW COMPARISON:  Portable chest yesterday at 1:21 p.m. FINDINGS: 5:11 a.m., 11/06/2021. Pigtail right chest pleural tube remains in place with pigtail in the lateral aspect of the mid chest. Small right pneumothorax with apical and lateral basal components appears improved, estimated 5% or less volume. The lungs are clear. No pleural effusion is seen. The cardiac size and mediastinal silhouette are normal. No acute osseous abnormalities. IMPRESSION: Small apical/lateral basal right pneumothorax is unchanged. Chest tube positioning is unchanged. No other evidence of acute chest process. Electronically Signed   By: Telford Nab M.D.   On: 11/06/2021 06:40   DG Chest Port 1 View  Result Date: 11/05/2021 CLINICAL DATA:  Follow-up right pneumothorax. EXAM: PORTABLE CHEST 1 VIEW COMPARISON:  Prior today FINDINGS: Right pleural pigtail catheter remains in place. A small right pneumothorax with both apical and basilar components appears mildly increased in size since prior study today. Both lungs are clear. Heart size is normal. IMPRESSION: Mild increase in size of small right pneumothorax. Electronically  Signed   By: Marlaine Hind M.D.   On: 11/05/2021 13:42   DG Chest Port 1 View  Result Date: 11/05/2021 CLINICAL DATA:  Chest tube placement for right pneumothorax EXAM: PORTABLE CHEST 1 VIEW COMPARISON:  Chest radiograph from one day prior. FINDINGS: Right chest tube terminates over the peripheral right mid pleural space. Stable cardiomediastinal silhouette with normal heart size. Tiny residual apical right pneumothorax, less than 5%, substantially decreased. No left pneumothorax. No pleural effusion. Improved right lung aeration with residual mild right basilar atelectasis. Otherwise clear lungs, with no pulmonary edema. IMPRESSION: 1. Tiny residual apical right pneumothorax, less than 5%, substantially decreased. Right chest  tube in place. 2. Improved right lung aeration with residual mild right basilar atelectasis. Electronically Signed   By: Ilona Sorrel M.D.   On: 11/05/2021 10:35   DG Chest Port 1 View  Result Date: 11/04/2021 CLINICAL DATA:  Shortness of breath.  Pneumothorax. EXAM: PORTABLE CHEST 1 VIEW COMPARISON:  Chest x-ray Nov 03, 2021. FINDINGS: Continued interval increase in size of the right hydropneumothorax, now measuring up to approximately 2.8 cm (previously 1.8 cm). No appreciable mediastinal shift. Right basilar opacities, probably atelectasis. Left lung is clear. Cardiomediastinal silhouette is similar to prior and within normal limits. IMPRESSION: 1. Continued interval increase in size of the right hydropneumothorax, now measuring up to approximately 2.8 cm (previously 1.8 cm). 2. Right basilar opacities, probably atelectasis. These results will be called to the ordering clinician or representative by the Radiologist Assistant, and communication documented in the PACS or [employer]. Electronically Signed   By: Margaretha Sheffield M.D.   On: 11/04/2021 07:39   DG Chest Port 1 View  Result Date: 11/03/2021 CLINICAL DATA:  Pneumothorax EXAM: PORTABLE CHEST 1 VIEW COMPARISON:   11/02/2021 FINDINGS: Slight interval increase in a right-sided hydropneumothorax, now approximately 25% in volume. The left lung is normally aerated. Heart and mediastinum are normal. IMPRESSION: Slight interval increase in a right-sided hydropneumothorax, now approximately 25% in volume. The left lung is normally aerated. These results will be called to the ordering clinician or representative by the Radiologist Assistant, and communication documented in the PACS or [employer]. Electronically Signed   By: Delanna Ahmadi M.D.   On: 11/03/2021 09:41   DG Chest Port 1 View  Result Date: 11/02/2021 CLINICAL DATA:  56 year old female with a history of alcoholic cirrhosis complicated by recurrent large volume ascites and hepatic hydrothorax. She presents for paracentesis, thoracentesis and TIPS creation. EXAM: 1. Ultrasound-guided paracentesis 2. Ultrasound-guided right thoracentesis 3. Ultrasound-guided right common femoral venous access for introduction of intravascular ultrasound 4. TIPS creation MEDICATIONS: As antibiotic prophylaxis, 1 g Rocephin was ordered pre-procedure and administered intravenously within one hour of incision. ANESTHESIA/SEDATION: General - as administered by the Anesthesia department CONTRAST:  110 mL Omnipaque 300 FLUOROSCOPY TIME:  Radiation exposure index: 461 mGy reference air kerma COMPLICATIONS: None immediate. PROCEDURE: Informed written consent was obtained from the patient after a thorough discussion of the procedural risks, benefits and alternatives. All questions were addressed. Maximal Sterile Barrier Technique was utilized including caps, mask, sterile gowns, sterile gloves, sterile drape, hand hygiene and skin antiseptic. A timeout was performed prior to the initiation of the procedure. PARACENTESIS The abdomen was interrogated with ultrasound. There is a large amount of free ascites. A suitable skin entry site was selected and marked. The region was sterilely prepped and  draped in the standard fashion using chlorhexidine skin prep. Local anesthesia was attained by infiltration with 1% lidocaine. A small dermatotomy was made. Under ultrasound guidance, a 6 French Safe-T-Centesis catheter was advanced into the fluid collection. Paracentesis was then performed yielding approximately 3.5 L of ascites. The catheter was removed and a bandage applied. THORACENTESIS The right chest was interrogated with ultrasound. There is a large right-sided pleural effusion. A suitable skin entry site was selected and marked. The region was sterilely prepped and draped in the standard fashion using chlorhexidine skin prep. Local anesthesia was attained by infiltration with 1% lidocaine. A small dermatotomy was made. Under ultrasound guidance, a 6 French Safe-T-Centesis catheter was advanced into the right pleural fluid. Thoracentesis was performed yielding 3 L of straw-colored pleural fluid.  The catheter was removed. A bandage was applied. INTRAVASCULAR [place] The right common femoral vein was interrogated with ultrasound and found to be widely patent. An image was obtained and stored for the medical record. Local anesthesia was attained by infiltration with 1% lidocaine. A small dermatotomy was made. Under real-time sonographic guidance, the vessel was punctured with a 21 gauge micropuncture needle. Using standard technique, the initial micro needle was exchanged over a 0.018 micro wire for a transitional 4 [place] micro sheath. The micro sheath was then exchanged over a 0.035 wire for a 9 French vascular sheath. The 8 French intracardiac echocardiography (ICE) catheter was then advanced over a Bentson wire into the inferior cavoatrial junction. Intravascular ultrasound was then performed evaluating the supra hepatic and intra hepatic IVC, hepatic veins and portal veins. The portal veins are diminutive but patent. TIPS The right internal jugular vein was interrogated with ultrasound and found to be widely  patent. An image was obtained and stored for the medical record. Local anesthesia was attained by infiltration with 1% lidocaine. A small dermatotomy was made. Under real-time sonographic guidance, the vessel was punctured with a 21 gauge micropuncture needle. Using standard technique, the initial micro needle was exchanged over a 0.018 micro wire for a transitional 4 [place] micro sheath. The micro sheath was then exchanged over a 0.035 wire for a 5 French vascular sheath. Attention was turned to the right abdomen. The abdomen was interrogated with ultrasound. The liver is shrunken and extremely heterogeneous. The gallbladder is mildly distended. There was great difficulty in identifying the diminutive portal veins. Several small portal venous radicles were accessed, however the trajectory would not facilitate passage of a wire into the central portal venous system. Ultimately, using a combination of mid epigastric transabdominal ultrasound guidance, and a right lateral intra axillary needle approach, a 22 gauge needle was successfully advanced along a transhepatic course from lateral to medial and used to select a peripheral branch of the right portal vein. A 0.018 wire was navigated in the central portal veins. The needle was then exchanged and a 5 [place] Accustick sheath advanced over the needle and into the main portal vein. Transhepatic portal venography was then performed. The intrahepatic and main portal veins are markedly diminutive. The 5 French catheter is essentially occlusive in the right portal vein. There is marked hypertrophy of the posterior gastric vein filling a large portosystemic collateral. The Nitinol portion of the 0.018 wire was inserted with the tip at the portal venous confluence to provide an intra portal marker. Attention was turned back to the 5 French sheath in the right internal jugular vein. The sheath was aspirated and flushed. A wire was introduced and passed through the heart into  the inferior vena cava. The 5 French sheath was removed. The skin tract was dilated to 10 [place] and a 9 [place] braided hydrophilic tips sheath advanced over the wire and positioned in the inferior vena cava. A 5 [place] MPA catheter was inserted coaxially through the sheath and the sheath was brought back into the right atrium. The catheter was then manipulated into the origin of the middle hepatic vein and the middle hepatic vein was selected over a Bentson wire. Once the catheter was in the middle hepatic vein, a hepatic venogram was performed confirming the venous anatomy. It is known that this represents the middle hepatic vein based on evaluation of prior CT imaging which demonstrates a diminutive and extremely posterior right hepatic vein. A superstiff Amplatz wire was advanced distally  into the middle hepatic vein. The tips sheath was advanced over the wire into the middle hepatic vein. Initially, and are gone scorpion tips set was advanced over the wire, through the sheath and into the middle hepatic vein. And attempts at puncture was made, however purchase within the middle hepatic vein is tenuous and the scorpion needle could not be advanced through the hepatic parenchyma without buckling out of the middle hepatic vein. Therefore, the scorpion system was removed. The standard Ring set and Colopinto needle were next introduced. A pass was made with colo pinto needle. On lateral imaging, it is clear that the middle hepatic vein and the colo pinto needle are quite posterior with respect to the right portal vein. It is obvious that it will be extremely challenging to make this pass naturally. Given the very small size of the liver, and the tenuous purchase within the middle hepatic vein there is concern for complication with multiple needle passes. Therefore, the decision was made to proceed with gun site technique to facilitate tips creation. The tips sheath was left in the middle hepatic vein but the colo  pinto needle was removed. A 4 French 65 cm snare was advanced through the sheath and positioned in the middle hepatic vein. Similarly, a second 4 French 65 cm snare was advanced through the trans hepatic portal venous access and placed in the central right portal vein. The image intensifier was then angulated until the 2 snares overlapped. A 22 gauge 15 cm Chiba needle was then carefully advanced from the anterior abdominal wall using real-time fluoroscopic guidance through the liver and through both snares. A 300 cm glidewire advantage microwire was then advanced through the needle and the needle was retracted leaving the wire in place. The wire was then successfully snared with the snare located in the middle hepatic vein. The wire was brought back through the tips sheath and out the hub such that there was flossing from the anterior abdominal wall through the anterior liver, through the right portal vein, into the middle hepatic vein, up the sheath an out the IJ access. A coaxial system was then created using a 150 cm 0.014 CXI support catheter inside a 130 cm 0.035 CXI support catheter. The coaxial catheter system was then loaded over the 0.014 glidewire advantage catheter and advanced from the IJ access through the liver and out the anterior abdominal wall (only the 0.014 portion of the system was advanced through the anterior abdominal wall). The 0.035 CXI catheter was position with the radiopaque marker in the region of the right portal vein as identified by the intra portal snare. While maintaining through and through wire access, the 0.014 CXI support catheter was removed. A second 014 glidewire advantage wire was then advanced coaxially through the 0.035 catheter and navigated into the portal vein and down the SMV. Next, the through and through flossing wire was carefully removed. The 0.014 CXI support catheter was reintroduced over the new Glidewire Advantage wire coaxially through the 0.035 CXI support  catheter. The catheter system was then carefully advanced through the access site in the right portal vein and into the central portal venous system. Contrast was injected confirming successful transhepatic portosystemic access. The 0.014 catheter and wire were removed. A superstiff Amplatz wire was advanced through the 035 catheter into the distal portal vein. The CXI support catheter was exchanged for a marker pigtail catheter. Portal venography was performed. Again, this confirms a large portosystemic collateral arising from the posterior gastric vein. Portal  and systemic pressures were then measured. Portal pressure: 30 mm Hg Right atrial pressure: 10 mm Hg Portosystemic gradient: 20 mm Hg (severe portal hypertension) A 6 cm covered, 2 cm un covered Gore Viatorr stent graft was then selected. The superstiff Amplatz wire was advanced through the marker pigtail catheter the pigtail catheter removed. The transhepatic tract was balloon dilated to 6 mm using a 6 x 40 mm mustang balloon. The tips sheath was then advanced over the balloon and into the main portal vein. At this time, maneuvers were performed in order to remove the snare around the sheath from the transhepatic portal venous access. To accomplish this, the tips sheath was pulled back to just within the transhepatic tract. The snare was closed around the existing superstiff Amplatz wire. A 5 French angled catheter and Glidewire were advanced coaxially through the tips sheath in the Glidewire and catheter system carefully passed into the main portal vein external to the closed snare. Portal venous access was confirmed by aspiration of blood and catheter injection. The glidewire was exchanged for a superstiff Amplatz wire. The initial superstiff Amplatz wire was then released from the snare and removed. This allowed successful capture and removal of the snare device. The tips sheath was readvanced over the superstiff Amplatz wire. The Viatorr stent graft was  introduced through the sheath and the metal portion of the stent graph un covered. This deployed nicely within the main portal vein. The stent graft was then pulled back in till it was snug against the right portal venous entry site. The remainder of the stent graft was un sheath and then deployed. The stent graft was post dilated to 8 mm using an 8 x 40 mm Conquest balloon. The balloon was exchanged for the pigtail catheter which was reintroduced into the main portal vein. Repeat portal venography was performed. There is preferential flow through the newly created tips. The stent graft is widely patent and flow is brisk. There is decreased flow through the large portal systemic collateral system. Pressures were remeasured. Portal pressure: 23 mm Hg Right atrial pressure: 18 mm Hg Portosystemic gradient: 5 mm Hg (normal portal venous pressure). At this time, some consideration was made for proceeding with embolization of the hypertrophic posterior gastric vein and associated shunt system. However, flow appears to be preferential through the tips. The decision was made to defer embolization at this time. This could be embolized in the future if it becomes clinically symptomatic. The tips sheath was removed and hemostasis attained by manual pressure. The intravascular ultrasound device was removed and the right common femoral sheath removed and hemostasis attained by manual pressure. The transhepatic portal venous access sheath was removed. Bandages were applied all 3 sites. The patient tolerated the procedure well and was taken to PACU in stable condition. IMPRESSION: 1. Paracentesis yielding 3.5 L ascites. 2. Right thoracentesis yielding 3 L pleural fluid. 3. Successful creation of a right middle hepatic to right portal venous tips utilizing a 6 cm covered 2 cm uncovered Gore Viatorr stent graft post dilated to 8 mm. 4. Pre TIPS portosystemic gradient of 20 mmHg consistent with severe portal hypertension. Post TIPS  portosystemic gradient of 5 mm Hg which is within normal limits. Signed, Criselda Peaches, MD, [place] [facility] Electronically Signed   By: Jacqulynn Cadet M.D.   On: 11/02/2021 10:01   DG Chest Port 1 View  Result Date: 11/01/2021 CLINICAL DATA:  Status post right thoracentesis. EXAM: PORTABLE CHEST 1  VIEW COMPARISON:  Oct 30, 2021. FINDINGS: Right pleural effusion is nearly resolved status post thoracentesis. Mild right basilar subsegmental atelectasis is noted. Small right apical pneumothorax ex vacuo is noted. IMPRESSION: Right pleural effusion is nearly resolved status post thoracentesis. Small right apical pneumothorax ex vacuo is noted. Electronically Signed   By: Marijo Conception M.D.   On: 11/01/2021 16:03   DG CHEST PORT 1 VIEW  Result Date: 10/30/2021 CLINICAL DATA:  Short of breath EXAM: PORTABLE CHEST 1 VIEW COMPARISON:  10/29/2021 FINDINGS: Large right pleural effusion. Apical component has improved however there is increase in pleural fluid in the mid and lower chest on the right. There is extensive airspace disease in the right lung particularly right lung base similar to the prior study. Improved aeration in the right apex. Left lung remains clear.  Normal vascularity.  Heart size normal. IMPRESSION: Progression of large right pleural effusion. Extensive airspace disease in the right lung probably the right lung base is similar. Improved aeration right apex. Left lung remains clear. Electronically Signed   By: Franchot Gallo M.D.   On: 10/30/2021 08:01   DG CHEST PORT 1 VIEW  Result Date: 10/29/2021 CLINICAL DATA:  Shortness of breath. EXAM: PORTABLE CHEST 1 VIEW COMPARISON:  10/27/2021 FINDINGS: Large right pleural effusion appears progressive in the interval with persistent airspace opacity in the right mid and lower lung. Left lung remains relatively clear. The cardiopericardial silhouette is within normal limits for size.  The visualized bony structures of the thorax are unremarkable. Telemetry leads overlie the chest. IMPRESSION: Interval progression of large right pleural effusion with persistent airspace opacity in the right mid and lower lung. Electronically Signed   By: Misty Stanley M.D.   On: 10/29/2021 07:59   DG CHEST PORT 1 VIEW  Result Date: 10/27/2021 CLINICAL DATA:  Pleural effusion EXAM: PORTABLE CHEST 1 VIEW COMPARISON:  10/26/2021 FINDINGS: Heart size appears normal. Moderate-large layering right-sided pleural effusion with hazy airspace opacity in the right mid to lower lung. Left lung remains clear. No pneumothorax. IMPRESSION: Moderate-large layering right-sided pleural effusion with hazy airspace opacity in the right mid to lower lung. Appearance is similar to the previous exam. Electronically Signed   By: Davina Poke D.O.   On: 10/27/2021 12:10   DG CHEST PORT 1 VIEW  Result Date: 10/26/2021 CLINICAL DATA:  Shortness of breath.  Right pleural effusion. EXAM: PORTABLE CHEST 1 VIEW COMPARISON:  Portable chest yesterday at 4:11 p.m. FINDINGS: 4:40 a.m., 10/26/2021. By report of the last study the patient underwent a 1 L right thoracentesis yesterday. Moderate layering right pleural effusion is again noted without appreciable pneumothorax. There is adjacent hazy atelectasis or consolidation in the right lower lung field. Rest of the lungs are clear. The cardiac size is normal. The left sulci are sharp. No acute osseous abnormality. IMPRESSION: No pneumothorax is seen following yesterday's thoracentesis. Moderate right pleural effusion remains with overlying atelectasis or consolidation. Overall aeration seems unchanged. Electronically Signed   By: Telford Nab M.D.   On: 10/26/2021 06:02   DG CHEST PORT 1 VIEW  Result Date: 10/25/2021 CLINICAL DATA:  Reason for exam: Right thoracenteiss 1.0 liter EXAM: PORTABLE CHEST - 1 VIEW COMPARISON:  Earlier film of the same day FINDINGS: No pneumothorax.  Significant improvement of the right pleural effusion with moderate residual. Improved aeration with some residual consolidation/atelectasis at the right lung base. Left lung is clear. Heart size and mediastinal contours are within normal limits. Visualized bones unremarkable. IMPRESSION: No  pneumothorax post right thoracentesis. Electronically Signed   By: Lucrezia Europe M.D.   On: 10/25/2021 16:29   DG CHEST PORT 1 VIEW  Result Date: 10/25/2021 CLINICAL DATA:  Shortness of breath, history of cirrhosis EXAM: PORTABLE CHEST 1 VIEW COMPARISON:  Radiograph 1 day prior FINDINGS: The cardiomediastinal silhouette is grossly stable. There is a large right pleural effusion, increased in size since the study from 1 day prior, with worsened aeration of the right lung. The left lung is clear. There is no significant left effusion. There is no pneumothorax There is no acute osseous abnormality. IMPRESSION: Large right pleural effusion, increased in size since the study from 1 day prior with worsened aeration of the right lung. Electronically Signed   By: Valetta Mole M.D.   On: 10/25/2021 08:24   DG CHEST PORT 1 VIEW  Result Date: 10/24/2021 CLINICAL DATA:  Shortness of breath.  History of cirrhosis. EXAM: PORTABLE CHEST 1 VIEW COMPARISON:  AP chest 10/23/2021, chest two views 10/22/2021, CT chest 10/22/2021 FINDINGS: There is again a moderate right pleural effusion with associated right mid lung heterogeneous airspace opacity. The left lung is clear. No pneumothorax. Cardiac silhouette and mediastinal contours are within normal limits. No acute skeletal abnormality. IMPRESSION: Moderate right pleural effusion, unchanged. Electronically Signed   By: Yvonne Kendall M.D.   On: 10/24/2021 08:10   CT IMAGE GUIDED DRAINAGE BY PERCUTANEOUS CATHETER  Result Date: 11/04/2021 INDICATION: 56 year old female status post recent tips creation complicated postprocedure by hydropneumothorax which is enlarging. The patient presents for  CT-guided thoracostomy tube. EXAM: CT IMAGE GUIDED DRAINAGE BY PERCUTANEOUS CATHETER COMPARISON:  None Available. MEDICATIONS: The patient is currently admitted to the hospital and receiving intravenous antibiotics. The antibiotics were administered within an appropriate time frame prior to the initiation of the procedure. ANESTHESIA/SEDATION: Moderate (conscious) sedation was employed during this procedure. A total of Versed 1.5 mg and Fentanyl 50 mcg was administered intravenously. Moderate Sedation Time: 15 minutes. The patient's level of consciousness and vital signs were monitored continuously by radiology nursing throughout the procedure under my direct supervision. CONTRAST:  None COMPLICATIONS: None immediate. PROCEDURE: RADIATION DOSE REDUCTION: This exam was performed according to the departmental dose-optimization program which includes automated exposure control, adjustment of the mA and/or kV according to patient size and/or use of iterative reconstruction technique. Informed written consent was obtained from the patient after a discussion of the risks, benefits and alternatives to treatment. The patient was placed supine, partially left lateral decubitus on the CT gantry and a pre procedural CT was performed re-demonstrating the known fluid collection within the right pleural space. The procedure was planned. A timeout was performed prior to the initiation of the procedure. The right posterolateral and inferior thorax was prepped and draped in the usual sterile fashion. The overlying soft tissues were anesthetized with 1% lidocaine with epinephrine. Appropriate trajectory was planned with the use of a 22 gauge spinal needle. An 18 gauge trocar needle was advanced into the pleural space and a short Amplatz super stiff wire was coiled within the collection. Appropriate positioning was confirmed with a limited CT scan. The tract was serially dilated allowing placement of a 14 French all-purpose drainage  catheter. Appropriate positioning was confirmed with a limited postprocedural CT scan. Air was immediately aspirated. The tube was connected to a pleurovac and sutured in place. A dressing was placed. The patient tolerated the procedure well without immediate post procedural complication. IMPRESSION: Successful CT guided placement of a 45 French all purpose drain  catheter into the anterior superior right pleural space. Ruthann Cancer, MD [facility] Electronically Signed   By: Ruthann Cancer M.D.   On: 11/04/2021 17:10   IR Paracentesis  Result Date: 11/02/2021 CLINICAL DATA:  56 year old female with a history of alcoholic cirrhosis complicated by recurrent large volume ascites and hepatic hydrothorax. She presents for paracentesis, thoracentesis and TIPS creation. EXAM: 1. Ultrasound-guided paracentesis 2. Ultrasound-guided right thoracentesis 3. Ultrasound-guided right common femoral venous access for introduction of intravascular ultrasound 4. TIPS creation MEDICATIONS: As antibiotic prophylaxis, 1 g Rocephin was ordered pre-procedure and administered intravenously within one hour of incision. ANESTHESIA/SEDATION: General - as administered by the Anesthesia department CONTRAST:  110 mL Omnipaque 300 FLUOROSCOPY TIME:  Radiation exposure index: 461 mGy reference air kerma COMPLICATIONS: None immediate. PROCEDURE: Informed written consent was obtained from the patient after a thorough discussion of the procedural risks, benefits and alternatives. All questions were addressed. Maximal Sterile Barrier Technique was utilized including caps, mask, sterile gowns, sterile gloves, sterile drape, hand hygiene and skin antiseptic. A timeout was performed prior to the initiation of the procedure. PARACENTESIS The abdomen was interrogated with ultrasound. There is a large amount of free ascites. A suitable skin entry site was selected and marked. The region was  sterilely prepped and draped in the standard fashion using chlorhexidine skin prep. Local anesthesia was attained by infiltration with 1% lidocaine. A small dermatotomy was made. Under ultrasound guidance, a 6 French Safe-T-Centesis catheter was advanced into the fluid collection. Paracentesis was then performed yielding approximately 3.5 L of ascites. The catheter was removed and a bandage applied. THORACENTESIS The right chest was interrogated with ultrasound. There is a large right-sided pleural effusion. A suitable skin entry site was selected and marked. The region was sterilely prepped and draped in the standard fashion using chlorhexidine skin prep. Local anesthesia was attained by infiltration with 1% lidocaine. A small dermatotomy was made. Under ultrasound guidance, a 6 French Safe-T-Centesis catheter was advanced into the right pleural fluid. Thoracentesis was performed yielding 3 L of straw-colored pleural fluid. The catheter was removed. A bandage was applied. INTRAVASCULAR [place] The right common femoral vein was interrogated with ultrasound and found to be widely patent. An image was obtained and stored for the medical record. Local anesthesia was attained by infiltration with 1% lidocaine. A small dermatotomy was made. Under real-time sonographic guidance, the vessel was punctured with a 21 gauge micropuncture needle. Using standard technique, the initial micro needle was exchanged over a 0.018 micro wire for a transitional 4 [place] micro sheath. The micro sheath was then exchanged over a 0.035 wire for a 9 French vascular sheath. The 8 French intracardiac echocardiography (ICE) catheter was then advanced over a Bentson wire into the inferior cavoatrial junction. Intravascular ultrasound was then performed evaluating the supra hepatic and intra hepatic IVC, hepatic veins and portal veins. The portal veins are diminutive but patent. TIPS The right internal jugular vein was interrogated with ultrasound and  found to be widely patent. An image was obtained and stored for the medical record. Local anesthesia was attained by infiltration with 1% lidocaine. A small dermatotomy was made. Under real-time sonographic guidance, the vessel was punctured with a 21 gauge micropuncture needle. Using standard technique, the initial micro needle was exchanged over a 0.018 micro wire for a transitional 4 [place] micro sheath. The micro sheath was then exchanged over a 0.035 wire for a 5 French vascular sheath. Attention was turned to the right abdomen.  The abdomen was interrogated with ultrasound. The liver is shrunken and extremely heterogeneous. The gallbladder is mildly distended. There was great difficulty in identifying the diminutive portal veins. Several small portal venous radicles were accessed, however the trajectory would not facilitate passage of a wire into the central portal venous system. Ultimately, using a combination of mid epigastric transabdominal ultrasound guidance, and a right lateral intra axillary needle approach, a 22 gauge needle was successfully advanced along a transhepatic course from lateral to medial and used to select a peripheral branch of the right portal vein. A 0.018 wire was navigated in the central portal veins. The needle was then exchanged and a 5 [place] Accustick sheath advanced over the needle and into the main portal vein. Transhepatic portal venography was then performed. The intrahepatic and main portal veins are markedly diminutive. The 5 French catheter is essentially occlusive in the right portal vein. There is marked hypertrophy of the posterior gastric vein filling a large portosystemic collateral. The Nitinol portion of the 0.018 wire was inserted with the tip at the portal venous confluence to provide an intra portal marker. Attention was turned back to the 5 French sheath in the right internal jugular vein. The sheath was aspirated and flushed. A wire was introduced and passed  through the heart into the inferior vena cava. The 5 French sheath was removed. The skin tract was dilated to 10 [place] and a 9 [place] braided hydrophilic tips sheath advanced over the wire and positioned in the inferior vena cava. A 5 [place] MPA catheter was inserted coaxially through the sheath and the sheath was brought back into the right atrium. The catheter was then manipulated into the origin of the middle hepatic vein and the middle hepatic vein was selected over a Bentson wire. Once the catheter was in the middle hepatic vein, a hepatic venogram was performed confirming the venous anatomy. It is known that this represents the middle hepatic vein based on evaluation of prior CT imaging which demonstrates a diminutive and extremely posterior right hepatic vein. A superstiff Amplatz wire was advanced distally into the middle hepatic vein. The tips sheath was advanced over the wire into the middle hepatic vein. Initially, and are gone scorpion tips set was advanced over the wire, through the sheath and into the middle hepatic vein. And attempts at puncture was made, however purchase within the middle hepatic vein is tenuous and the scorpion needle could not be advanced through the hepatic parenchyma without buckling out of the middle hepatic vein. Therefore, the scorpion system was removed. The standard Ring set and Colopinto needle were next introduced. A pass was made with colo pinto needle. On lateral imaging, it is clear that the middle hepatic vein and the colo pinto needle are quite posterior with respect to the right portal vein. It is obvious that it will be extremely challenging to make this pass naturally. Given the very small size of the liver, and the tenuous purchase within the middle hepatic vein there is concern for complication with multiple needle passes. Therefore, the decision was made to proceed with gun site technique to facilitate tips creation. The tips sheath was left in the middle  hepatic vein but the colo pinto needle was removed. A 4 French 65 cm snare was advanced through the sheath and positioned in the middle hepatic vein. Similarly, a second 4 French 65 cm snare was advanced through the trans hepatic portal venous access and placed in the central right portal vein. The image intensifier  was then angulated until the 2 snares overlapped. A 22 gauge 15 cm Chiba needle was then carefully advanced from the anterior abdominal wall using real-time fluoroscopic guidance through the liver and through both snares. A 300 cm glidewire advantage microwire was then advanced through the needle and the needle was retracted leaving the wire in place. The wire was then successfully snared with the snare located in the middle hepatic vein. The wire was brought back through the tips sheath and out the hub such that there was flossing from the anterior abdominal wall through the anterior liver, through the right portal vein, into the middle hepatic vein, up the sheath an out the IJ access. A coaxial system was then created using a 150 cm 0.014 CXI support catheter inside a 130 cm 0.035 CXI support catheter. The coaxial catheter system was then loaded over the 0.014 glidewire advantage catheter and advanced from the IJ access through the liver and out the anterior abdominal wall (only the 0.014 portion of the system was advanced through the anterior abdominal wall). The 0.035 CXI catheter was position with the radiopaque marker in the region of the right portal vein as identified by the intra portal snare. While maintaining through and through wire access, the 0.014 CXI support catheter was removed. A second 014 glidewire advantage wire was then advanced coaxially through the 0.035 catheter and navigated into the portal vein and down the SMV. Next, the through and through flossing wire was carefully removed. The 0.014 CXI support catheter was reintroduced over the new Glidewire Advantage wire coaxially  through the 0.035 CXI support catheter. The catheter system was then carefully advanced through the access site in the right portal vein and into the central portal venous system. Contrast was injected confirming successful transhepatic portosystemic access. The 0.014 catheter and wire were removed. A superstiff Amplatz wire was advanced through the 035 catheter into the distal portal vein. The CXI support catheter was exchanged for a marker pigtail catheter. Portal venography was performed. Again, this confirms a large portosystemic collateral arising from the posterior gastric vein. Portal and systemic pressures were then measured. Portal pressure: 30 mm Hg Right atrial pressure: 10 mm Hg Portosystemic gradient: 20 mm Hg (severe portal hypertension) A 6 cm covered, 2 cm un covered Gore Viatorr stent graft was then selected. The superstiff Amplatz wire was advanced through the marker pigtail catheter the pigtail catheter removed. The transhepatic tract was balloon dilated to 6 mm using a 6 x 40 mm mustang balloon. The tips sheath was then advanced over the balloon and into the main portal vein. At this time, maneuvers were performed in order to remove the snare around the sheath from the transhepatic portal venous access. To accomplish this, the tips sheath was pulled back to just within the transhepatic tract. The snare was closed around the existing superstiff Amplatz wire. A 5 French angled catheter and Glidewire were advanced coaxially through the tips sheath in the Glidewire and catheter system carefully passed into the main portal vein external to the closed snare. Portal venous access was confirmed by aspiration of blood and catheter injection. The glidewire was exchanged for a superstiff Amplatz wire. The initial superstiff Amplatz wire was then released from the snare and removed. This allowed successful capture and removal of the snare device. The tips sheath was readvanced over the superstiff Amplatz  wire. The Viatorr stent graft was introduced through the sheath and the metal portion of the stent graph un covered. This deployed nicely within  the main portal vein. The stent graft was then pulled back in till it was snug against the right portal venous entry site. The remainder of the stent graft was un sheath and then deployed. The stent graft was post dilated to 8 mm using an 8 x 40 mm Conquest balloon. The balloon was exchanged for the pigtail catheter which was reintroduced into the main portal vein. Repeat portal venography was performed. There is preferential flow through the newly created tips. The stent graft is widely patent and flow is brisk. There is decreased flow through the large portal systemic collateral system. Pressures were remeasured. Portal pressure: 23 mm Hg Right atrial pressure: 18 mm Hg Portosystemic gradient: 5 mm Hg (normal portal venous pressure). At this time, some consideration was made for proceeding with embolization of the hypertrophic posterior gastric vein and associated shunt system. However, flow appears to be preferential through the tips. The decision was made to defer embolization at this time. This could be embolized in the future if it becomes clinically symptomatic. The tips sheath was removed and hemostasis attained by manual pressure. The intravascular ultrasound device was removed and the right common femoral sheath removed and hemostasis attained by manual pressure. The transhepatic portal venous access sheath was removed. Bandages were applied all 3 sites. The patient tolerated the procedure well and was taken to PACU in stable condition. IMPRESSION: 1. Paracentesis yielding 3.5 L ascites. 2. Right thoracentesis yielding 3 L pleural fluid. 3. Successful creation of a right middle hepatic to right portal venous tips utilizing a 6 cm covered 2 cm uncovered Gore Viatorr stent graft post dilated to 8 mm. 4. Pre TIPS portosystemic gradient of 20 mmHg consistent with  severe portal hypertension. Post TIPS portosystemic gradient of 5 mm Hg which is within normal limits. Signed, Criselda Peaches, MD, [facility] Radiology Electronically Signed   By: Jacqulynn Cadet M.D.   On: 11/02/2021 10:01   IR THORACENTESIS ASP PLEURAL SPACE W/IMG GUIDE  Result Date: 11/02/2021 CLINICAL DATA:  56 year old female with a history of alcoholic cirrhosis complicated by recurrent large volume ascites and hepatic hydrothorax. She presents for paracentesis, thoracentesis and TIPS creation. EXAM: 1. Ultrasound-guided paracentesis 2. Ultrasound-guided right thoracentesis 3. Ultrasound-guided right common femoral venous access for introduction of intravascular ultrasound 4. TIPS creation MEDICATIONS: As antibiotic prophylaxis, 1 g Rocephin was ordered pre-procedure and administered intravenously within one hour of incision. ANESTHESIA/SEDATION: General - as administered by the Anesthesia department CONTRAST:  110 mL Omnipaque 300 FLUOROSCOPY TIME:  Radiation exposure index: 461 mGy reference air kerma COMPLICATIONS: None immediate. PROCEDURE: Informed written consent was obtained from the patient after a thorough discussion of the procedural risks, benefits and alternatives. All questions were addressed. Maximal Sterile Barrier Technique was utilized including caps, mask, sterile gowns, sterile gloves, sterile drape, hand hygiene and skin antiseptic. A timeout was performed prior to the initiation of the procedure. PARACENTESIS The abdomen was interrogated with ultrasound. There is a large amount of free ascites. A suitable skin entry site was selected and marked. The region was sterilely prepped and draped in the standard fashion using chlorhexidine skin prep. Local anesthesia was attained by infiltration with 1% lidocaine. A small dermatotomy was made. Under ultrasound guidance, a 6 French Safe-T-Centesis catheter was advanced into the fluid  collection. Paracentesis was then performed yielding approximately 3.5 L of ascites. The catheter was removed and a bandage applied. THORACENTESIS The right chest was interrogated with ultrasound. There is a large right-sided  pleural effusion. A suitable skin entry site was selected and marked. The region was sterilely prepped and draped in the standard fashion using chlorhexidine skin prep. Local anesthesia was attained by infiltration with 1% lidocaine. A small dermatotomy was made. Under ultrasound guidance, a 6 French Safe-T-Centesis catheter was advanced into the right pleural fluid. Thoracentesis was performed yielding 3 L of straw-colored pleural fluid. The catheter was removed. A bandage was applied. INTRAVASCULAR [place] The right common femoral vein was interrogated with ultrasound and found to be widely patent. An image was obtained and stored for the medical record. Local anesthesia was attained by infiltration with 1% lidocaine. A small dermatotomy was made. Under real-time sonographic guidance, the vessel was punctured with a 21 gauge micropuncture needle. Using standard technique, the initial micro needle was exchanged over a 0.018 micro wire for a transitional 4 [place] micro sheath. The micro sheath was then exchanged over a 0.035 wire for a 9 French vascular sheath. The 8 French intracardiac echocardiography (ICE) catheter was then advanced over a Bentson wire into the inferior cavoatrial junction. Intravascular ultrasound was then performed evaluating the supra hepatic and intra hepatic IVC, hepatic veins and portal veins. The portal veins are diminutive but patent. TIPS The right internal jugular vein was interrogated with ultrasound and found to be widely patent. An image was obtained and stored for the medical record. Local anesthesia was attained by infiltration with 1% lidocaine. A small dermatotomy was made. Under real-time sonographic guidance, the vessel was punctured with a 21 gauge  micropuncture needle. Using standard technique, the initial micro needle was exchanged over a 0.018 micro wire for a transitional 4 [place] micro sheath. The micro sheath was then exchanged over a 0.035 wire for a 5 French vascular sheath. Attention was turned to the right abdomen. The abdomen was interrogated with ultrasound. The liver is shrunken and extremely heterogeneous. The gallbladder is mildly distended. There was great difficulty in identifying the diminutive portal veins. Several small portal venous radicles were accessed, however the trajectory would not facilitate passage of a wire into the central portal venous system. Ultimately, using a combination of mid epigastric transabdominal ultrasound guidance, and a right lateral intra axillary needle approach, a 22 gauge needle was successfully advanced along a transhepatic course from lateral to medial and used to select a peripheral branch of the right portal vein. A 0.018 wire was navigated in the central portal veins. The needle was then exchanged and a 5 [place] Accustick sheath advanced over the needle and into the main portal vein. Transhepatic portal venography was then performed. The intrahepatic and main portal veins are markedly diminutive. The 5 French catheter is essentially occlusive in the right portal vein. There is marked hypertrophy of the posterior gastric vein filling a large portosystemic collateral. The Nitinol portion of the 0.018 wire was inserted with the tip at the portal venous confluence to provide an intra portal marker. Attention was turned back to the 5 French sheath in the right internal jugular vein. The sheath was aspirated and flushed. A wire was introduced and passed through the heart into the inferior vena cava. The 5 French sheath was removed. The skin tract was dilated to 10 [place] and a 9 [place] braided hydrophilic tips sheath advanced over the wire and positioned in the inferior vena cava. A 5 [place] MPA catheter was  inserted coaxially through the sheath and the sheath was brought back into the right atrium. The catheter was then manipulated into the origin of the middle  hepatic vein and the middle hepatic vein was selected over a Bentson wire. Once the catheter was in the middle hepatic vein, a hepatic venogram was performed confirming the venous anatomy. It is known that this represents the middle hepatic vein based on evaluation of prior CT imaging which demonstrates a diminutive and extremely posterior right hepatic vein. A superstiff Amplatz wire was advanced distally into the middle hepatic vein. The tips sheath was advanced over the wire into the middle hepatic vein. Initially, and are gone scorpion tips set was advanced over the wire, through the sheath and into the middle hepatic vein. And attempts at puncture was made, however purchase within the middle hepatic vein is tenuous and the scorpion needle could not be advanced through the hepatic parenchyma without buckling out of the middle hepatic vein. Therefore, the scorpion system was removed. The standard Ring set and Colopinto needle were next introduced. A pass was made with colo pinto needle. On lateral imaging, it is clear that the middle hepatic vein and the colo pinto needle are quite posterior with respect to the right portal vein. It is obvious that it will be extremely challenging to make this pass naturally. Given the very small size of the liver, and the tenuous purchase within the middle hepatic vein there is concern for complication with multiple needle passes. Therefore, the decision was made to proceed with gun site technique to facilitate tips creation. The tips sheath was left in the middle hepatic vein but the colo pinto needle was removed. A 4 French 65 cm snare was advanced through the sheath and positioned in the middle hepatic vein. Similarly, a second 4 French 65 cm snare was advanced through the trans hepatic portal venous access and placed in  the central right portal vein. The image intensifier was then angulated until the 2 snares overlapped. A 22 gauge 15 cm Chiba needle was then carefully advanced from the anterior abdominal wall using real-time fluoroscopic guidance through the liver and through both snares. A 300 cm glidewire advantage microwire was then advanced through the needle and the needle was retracted leaving the wire in place. The wire was then successfully snared with the snare located in the middle hepatic vein. The wire was brought back through the tips sheath and out the hub such that there was flossing from the anterior abdominal wall through the anterior liver, through the right portal vein, into the middle hepatic vein, up the sheath an out the IJ access. A coaxial system was then created using a 150 cm 0.014 CXI support catheter inside a 130 cm 0.035 CXI support catheter. The coaxial catheter system was then loaded over the 0.014 glidewire advantage catheter and advanced from the IJ access through the liver and out the anterior abdominal wall (only the 0.014 portion of the system was advanced through the anterior abdominal wall). The 0.035 CXI catheter was position with the radiopaque marker in the region of the right portal vein as identified by the intra portal snare. While maintaining through and through wire access, the 0.014 CXI support catheter was removed. A second 014 glidewire advantage wire was then advanced coaxially through the 0.035 catheter and navigated into the portal vein and down the SMV. Next, the through and through flossing wire was carefully removed. The 0.014 CXI support catheter was reintroduced over the new Glidewire Advantage wire coaxially through the 0.035 CXI support catheter. The catheter system was then carefully advanced through the access site in the right portal vein and into  the central portal venous system. Contrast was injected confirming successful transhepatic portosystemic access. The 0.014  catheter and wire were removed. A superstiff Amplatz wire was advanced through the 035 catheter into the distal portal vein. The CXI support catheter was exchanged for a marker pigtail catheter. Portal venography was performed. Again, this confirms a large portosystemic collateral arising from the posterior gastric vein. Portal and systemic pressures were then measured. Portal pressure: 30 mm Hg Right atrial pressure: 10 mm Hg Portosystemic gradient: 20 mm Hg (severe portal hypertension) A 6 cm covered, 2 cm un covered [place] Viatorr stent graft was then selected. The superstiff Amplatz wire was advanced through the marker pigtail catheter the pigtail catheter removed. The transhepatic tract was balloon dilated to 6 mm using a 6 x 40 mm mustang balloon. The tips sheath was then advanced over the balloon and into the main portal vein. At this time, maneuvers were performed in order to remove the snare around the sheath from the transhepatic portal venous access. To accomplish this, the tips sheath was pulled back to just within the transhepatic tract. The snare was closed around the existing superstiff Amplatz wire. A 5 French angled catheter and Glidewire were advanced coaxially through the tips sheath in the Glidewire and catheter system carefully passed into the main portal vein external to the closed snare. Portal venous access was confirmed by aspiration of blood and catheter injection. The glidewire was exchanged for a superstiff Amplatz wire. The initial superstiff Amplatz wire was then released from the snare and removed. This allowed successful capture and removal of the snare device. The tips sheath was readvanced over the superstiff Amplatz wire. The Viatorr stent graft was introduced through the sheath and the metal portion of the stent graph un covered. This deployed nicely within the main portal vein. The stent graft was then pulled back in till it was snug against the right portal venous entry site. The  remainder of the stent graft was un sheath and then deployed. The stent graft was post dilated to 8 mm using an 8 x 40 mm Conquest balloon. The balloon was exchanged for the pigtail catheter which was reintroduced into the main portal vein. Repeat portal venography was performed. There is preferential flow through the newly created tips. The stent graft is widely patent and flow is brisk. There is decreased flow through the large portal systemic collateral system. Pressures were remeasured. Portal pressure: 23 mm Hg Right atrial pressure: 18 mm Hg Portosystemic gradient: 5 mm Hg (normal portal venous pressure). At this time, some consideration was made for proceeding with embolization of the hypertrophic posterior gastric vein and associated shunt system. However, flow appears to be preferential through the tips. The decision was made to defer embolization at this time. This could be embolized in the future if it becomes clinically symptomatic. The tips sheath was removed and hemostasis attained by manual pressure. The intravascular ultrasound device was removed and the right common femoral sheath removed and hemostasis attained by manual pressure. The transhepatic portal venous access sheath was removed. Bandages were applied all 3 sites. The patient tolerated the procedure well and was taken to PACU in stable condition. IMPRESSION: 1. Paracentesis yielding 3.5 L ascites. 2. Right thoracentesis yielding 3 L pleural fluid. 3. Successful creation of a right middle hepatic to right portal venous tips utilizing a 6 cm covered 2 cm uncovered Gore Viatorr stent graft post dilated to 8 mm. 4. Pre TIPS portosystemic gradient of 20 mmHg consistent with severe  portal hypertension. Post TIPS portosystemic gradient of 5 mm Hg which is within normal limits. Signed, Criselda Peaches, MD, [facility] Electronically Signed   By: Jacqulynn Cadet M.D.   On:  11/02/2021 10:01   IR THORACENTESIS ASP PLEURAL SPACE W/IMG GUIDE  Result Date: 10/26/2021 INDICATION: Patient with history of cirrhosis and recurrent ascites, now with recurrent pleural effusion. Request made for diagnostic thoracentesis. EXAM: ULTRASOUND GUIDED RIGHT THORACENTESIS MEDICATIONS: 10 mL 1% lidocaine COMPLICATIONS: None immediate. PROCEDURE: An ultrasound guided thoracentesis was thoroughly discussed with the patient and questions answered. The benefits, risks, alternatives and complications were also discussed. The patient understands and wishes to proceed with the procedure. Written consent was obtained. Ultrasound was performed to localize and mark an adequate pocket of fluid in the right chest. The area was then prepped and draped in the normal sterile fashion. 1% Lidocaine was used for local anesthesia. Under ultrasound guidance a 6 Fr Safe-T-Centesis catheter was introduced. Thoracentesis was performed. The catheter was removed and a dressing applied. FINDINGS: A total of approximately 1.0 liters of dark yellow fluid was removed. Samples were sent to the laboratory as requested by the clinical team. IMPRESSION: Successful ultrasound guided right thoracentesis yielding of pleural fluid. Read by: Brynda Greathouse PA-C Electronically Signed   By: Lucrezia Europe M.D.   On: 10/26/2021 08:56    Physical Exam: There were no vitals taken for this visit. Constitutional: Pleasant,well-developed, ***female in no acute distress. HEENT: Normocephalic and atraumatic. Conjunctivae are normal. No scleral icterus. Neck supple.  Cardiovascular: Normal rate, regular rhythm.  Pulmonary/chest: Effort normal and breath sounds normal. No wheezing, rales or rhonchi. Abdominal: Soft, nondistended, nontender. Bowel sounds active throughout. There are no masses palpable. No hepatomegaly. Extremities: no edema Lymphadenopathy: No cervical adenopathy noted. Neurological: Alert and oriented to person place and  time. Skin: Skin is warm and dry. No rashes noted. Psychiatric: Normal mood and affect. Behavior is normal.   ASSESSMENT AND PLAN:  Katie Ruddy, MD

## 2021-11-22 NOTE — Addendum Note (Signed)
Addended by: Yevette Edwards on: 11/22/2021 02:44 PM   Modules accepted: Orders

## 2021-11-22 NOTE — Telephone Encounter (Signed)
   Telephone encounter was:  Successful.  11/22/2021 Name: Latressa Harries MRN: [id number] DOB: [birth year]  Ed Rayson is a 56 y.o. year old female who is a primary care patient of Volanda Napoleon, Langley Adie, MD . The community resource team was consulted for assistance with patient provided low income energy assistance as well as housing information mailed to also established [place] [facility] referral  Care guide performed the following interventions: Patient provided with information about care guide support team and interviewed to confirm resource needs.  Follow Up Plan:  Care guide will follow up with patient by phone over the next week  [place], Care Management  [phone] [address] , [place] [address] Email : [email or web address]

## 2021-11-25 ENCOUNTER — Telehealth: Payer: Self-pay

## 2021-11-25 ENCOUNTER — Telehealth: Payer: Self-pay | Admitting: *Deleted

## 2021-11-25 ENCOUNTER — Other Ambulatory Visit (INDEPENDENT_AMBULATORY_CARE_PROVIDER_SITE_OTHER): Payer: Medicare HMO

## 2021-11-25 DIAGNOSIS — K7031 Alcoholic cirrhosis of liver with ascites: Secondary | ICD-10-CM

## 2021-11-25 DIAGNOSIS — R69 Illness, unspecified: Secondary | ICD-10-CM | POA: Diagnosis not present

## 2021-11-25 DIAGNOSIS — I8500 Esophageal varices without bleeding: Secondary | ICD-10-CM | POA: Diagnosis not present

## 2021-11-25 LAB — BASIC METABOLIC PANEL
BUN: 15 mg/dL (ref 6–23)
CO2: 24 mEq/L (ref 19–32)
Calcium: 8.6 mg/dL (ref 8.4–10.5)
Chloride: 107 mEq/L (ref 96–112)
Creatinine, Ser: 0.73 mg/dL (ref 0.40–1.20)
GFR: 91.62 mL/min (ref >60.00)
Glucose, Bld: 98 mg/dL (ref 70–99)
Potassium: 4 mEq/L (ref 3.5–5.1)
Sodium: 137 mEq/L (ref 135–145)

## 2021-11-25 NOTE — Telephone Encounter (Signed)
Pt has not come in for labs yet. I called and spoke with [name]. [name] states that pt should have been by for labs already. I told her that she has not. [name] states that she is going to call her step-father and see what's going on.

## 2021-11-25 NOTE — Telephone Encounter (Signed)
-----   Message from Yevette Edwards, RN sent at 11/22/2021  3:58 PM EDT ----- Regarding: Labs today BMET today

## 2021-11-25 NOTE — Telephone Encounter (Signed)
Pt came in for labs.

## 2021-11-27 ENCOUNTER — Telehealth: Payer: Self-pay | Admitting: Family Medicine

## 2021-11-27 ENCOUNTER — Other Ambulatory Visit: Payer: Medicare HMO

## 2021-11-27 ENCOUNTER — Telehealth: Payer: Self-pay | Admitting: Gastroenterology

## 2021-11-27 ENCOUNTER — Inpatient Hospital Stay: Admission: RE | Admit: 2021-11-27 | Payer: Medicare HMO | Source: Ambulatory Visit

## 2021-11-27 DIAGNOSIS — K7031 Alcoholic cirrhosis of liver with ascites: Secondary | ICD-10-CM

## 2021-11-27 NOTE — Telephone Encounter (Signed)
[place] if she has recurrent ascites we can set her up for paracentesis, can do large volume and give albumin if more than 5 L. Please send fluid for cell count and diff. thanks

## 2021-11-27 NOTE — Telephone Encounter (Signed)
Calling to report 8/10 pain in both legs, swelling in both. Pulse 115

## 2021-11-28 NOTE — Telephone Encounter (Signed)
IR paracentesis order in epic. Pt has been scheduled for a paracentesis on Monday, 12/02/21 at 9 am. Pt to arrive at [facility] by 8:30 am. This is the only appt available prior to her appt with Dr. Havery Moros.   Lm on vm for patient to return call.

## 2021-11-28 NOTE — Telephone Encounter (Signed)
Returned call to patient. She is aware of her paracentesis appt. I did remind pt of her follow up appt with Dr. Havery Moros on Tuesday, 12/03/21 at 11 am. Pt verbalized understanding and had no concerns at the end of the call.

## 2021-11-28 NOTE — Telephone Encounter (Signed)
Inbound call from patient returning call. Please give patient a call back to advise. Thank you 

## 2021-11-29 ENCOUNTER — Ambulatory Visit (HOSPITAL_COMMUNITY)
Admission: RE | Admit: 2021-11-29 | Discharge: 2021-11-29 | Disposition: A | Discharge status: Home / Self Care | Payer: Medicare HMO | Source: Ambulatory Visit | Attending: Gastroenterology | Admitting: Gastroenterology

## 2021-11-29 ENCOUNTER — Ambulatory Visit (HOSPITAL_COMMUNITY)
Admission: RE | Admit: 2021-11-29 | Discharge: 2021-11-29 | Disposition: A | Discharge status: Home / Self Care | Payer: Medicare HMO | Source: Ambulatory Visit | Attending: Student | Admitting: Student

## 2021-11-29 VITALS — BP 116/74

## 2021-11-29 DIAGNOSIS — R188 Other ascites: Secondary | ICD-10-CM | POA: Diagnosis not present

## 2021-11-29 DIAGNOSIS — K7031 Alcoholic cirrhosis of liver with ascites: Secondary | ICD-10-CM | POA: Diagnosis not present

## 2021-11-29 DIAGNOSIS — J90 Pleural effusion, not elsewhere classified: Secondary | ICD-10-CM | POA: Diagnosis not present

## 2021-11-29 DIAGNOSIS — J939 Pneumothorax, unspecified: Secondary | ICD-10-CM

## 2021-11-29 DIAGNOSIS — R69 Illness, unspecified: Secondary | ICD-10-CM | POA: Diagnosis not present

## 2021-11-29 HISTORY — PX: IR PARACENTESIS: IMG2679

## 2021-11-29 LAB — BODY FLUID CELL COUNT WITH DIFFERENTIAL
Eos, Fluid: 0 %
Lymphs, Fluid: 23 %
Monocyte-Macrophage-Serous Fluid: 43 % — ABNORMAL LOW (ref 50–90)
Neutrophil Count, Fluid: 34 % — ABNORMAL HIGH (ref 0–25)
Total Nucleated Cell Count, Fluid: 14 cu mm (ref 0–1000)

## 2021-11-29 MED ORDER — ALBUMIN HUMAN 25 % IV SOLN
INTRAVENOUS | Status: AC
  Filled 2021-11-29: qty 200

## 2021-11-29 MED ORDER — LIDOCAINE HCL (PF) 1 % IJ SOLN
INTRAMUSCULAR | Status: DC | PRN
  Administered 2021-11-29: 10 mL

## 2021-11-29 MED ORDER — LIDOCAINE HCL 1 % IJ SOLN
INTRAMUSCULAR | Status: AC
  Filled 2021-11-29: qty 20

## 2021-11-29 MED ORDER — ALBUMIN HUMAN 25 % IV SOLN
50.0000 g | Freq: Once | INTRAVENOUS | Status: AC
  Administered 2021-11-29: 50 g via INTRAVENOUS

## 2021-11-29 NOTE — Progress Notes (Signed)
Patient presented to the [facility] IR department today for a paracentesis. Patient noted to be slightly confused and not at her baseline compared to the last time IR evaluated her/spoke with her on the phone. She missed her follow up appointment with IR earlier this week. She was able to answer most questions appropriately but was mildly forgetful. She states she has been taking her lactulose as directed and is having at least two soft bowel movements per day with occasional diarrhea.  Patient noted to have bilateral lower extremity edema and had over 7 L removed from the paracentesis. A chest x-ray was ordered and patient was found to have a moderate right pleural effusion. She is asymptomatic. IR does not recommend a thoracentesis unless she become symptomatic.   Patient has a follow up with Dr. Havery Moros next week. Leianna was discharged from the IR department and she has a friend/family member who will drive her home.    [name], [place] [phone] 11/29/2021, 3:17 PM

## 2021-11-29 NOTE — Procedures (Signed)
PROCEDURE SUMMARY:  Successful US guided paracentesis from RLQ.  Yielded 7.5L of clear ascitic fluid.  No immediate complications.  Pt tolerated well.   Specimen was sent for labs.  EBL < 36m  Maxten Shuler PA-C 11/29/2021 1:34 PM

## 2021-12-02 ENCOUNTER — Telehealth: Payer: Self-pay | Admitting: Family Medicine

## 2021-12-02 ENCOUNTER — Other Ambulatory Visit (HOSPITAL_COMMUNITY): Payer: Medicare HMO

## 2021-12-02 NOTE — Telephone Encounter (Signed)
Collier Salina call  from [facility] and stated pt will not except any visits from, [place] stated they will still try,Peter's # is [phone].

## 2021-12-02 NOTE — Telephone Encounter (Signed)
fyi

## 2021-12-03 ENCOUNTER — Encounter: Payer: Self-pay | Admitting: Gastroenterology

## 2021-12-03 ENCOUNTER — Telehealth: Payer: Self-pay | Admitting: Gastroenterology

## 2021-12-03 ENCOUNTER — Ambulatory Visit (INDEPENDENT_AMBULATORY_CARE_PROVIDER_SITE_OTHER): Payer: Medicare HMO | Admitting: Gastroenterology

## 2021-12-03 ENCOUNTER — Other Ambulatory Visit (HOSPITAL_COMMUNITY): Payer: Medicare HMO

## 2021-12-03 VITALS — BP 132/78 | HR 105 | Ht 64.0 in | Wt 129.2 lb

## 2021-12-03 DIAGNOSIS — K7031 Alcoholic cirrhosis of liver with ascites: Secondary | ICD-10-CM | POA: Diagnosis not present

## 2021-12-03 DIAGNOSIS — I8500 Esophageal varices without bleeding: Secondary | ICD-10-CM | POA: Diagnosis not present

## 2021-12-03 DIAGNOSIS — R69 Illness, unspecified: Secondary | ICD-10-CM | POA: Diagnosis not present

## 2021-12-03 DIAGNOSIS — J948 Other specified pleural conditions: Secondary | ICD-10-CM | POA: Diagnosis not present

## 2021-12-03 NOTE — Telephone Encounter (Signed)
Pt has arrived for her appt

## 2021-12-03 NOTE — Telephone Encounter (Signed)
Called and spoke with Pierz. She states that she was not aware that pt called and cancelled her appt for today.  I did explain to her that this is not the first time the patient has cancelled on the same day as her appt. [name] has been advised that if patient continues to no show her appts she will be discharged from the practice. [name] states that she is going to try her best to bring her mom in for the appt today. [name] verbalized understanding and had no concerns at the end of the call.

## 2021-12-03 NOTE — Telephone Encounter (Signed)
Good Morning Dr. Havery Moros,  Patient called stating she would not be able to make it to her appointment with you this morning due to her ride not being able to bring her.  Patient was rescheduled for 8/1 at 9:50.

## 2021-12-03 NOTE — Telephone Encounter (Signed)
Inbound call from patient requesting to know if Dr. Havery Moros really wanted her to come in. I advised her about the note below. Patient stated her and her daughter were on the way. I have canceled the appointment we scheduled earlier when she called to cancel.

## 2021-12-03 NOTE — Patient Instructions (Addendum)
Your provider has requested that you go to the basement level for lab work in 1 week. Press "B" on the elevator. The lab is located at the first door on the left as you exit the elevator.   Keep follow up in August with Dr. Havery Moros.   If you are age 57 or older, your body mass index should be between 23-30. Your Body mass index is 22.19 kg/m. If this is out of the aforementioned range listed, please consider follow up with your Primary Care Provider.  If you are age 68 or younger, your body mass index should be between 19-25. Your Body mass index is 22.19 kg/m. If this is out of the aformentioned range listed, please consider follow up with your Primary Care Provider.   ________________________________________________________  The [facility] providers would like to encourage you to use [facility] to communicate with providers for non-urgent requests or questions.  Due to long hold times on the telephone, sending your provider a message by [facility] may be a faster and more efficient way to get a response.  Please allow 48 business hours for a response.  Please remember that this is for non-urgent requests.  _______________________________________________________

## 2021-12-03 NOTE — Telephone Encounter (Signed)
[place] can you please contact the patient's daughter. This is the second straight time she has cancelled same day and suspect her family his not aware. Can you see if you please try calling the patient's daughter and let her know - if she can make it I would still like to see her today at 26 if someone can bring her.  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

## 2021-12-03 NOTE — Progress Notes (Signed)
HPI :  56 y/o female here for a follow up visit for decompensated alcoholic cirrhosis, accompanied by her daughter today today. Recall she has a history of significant esophageal varices leading to bleeding in the past.  She has had a few admissions at the end of 2019 and required over 25 bands to eradicate varices.  Her course is also been complicated by significant ascites and hepatic hydrothorax.  Recall she has not been on nonselective beta-blockade in the past due to refractory ascites.  She had an EGD in March showing trace varices without any stigmata for bleeding.  I had referred her to IR to consider outpatient TIPS and she was scheduled for this, unfortunately got admitted to the hospital before she could do this electively with hepatic hydrothorax and respiratory distress.  She had multiple thoracentesis, was in the hospital for several days on high-dose diuretics and had a hard time diuresing her.  Eventually she got TIPS placed per IR while she was admitted to the hospital in mid May.  She was in the hospital several days recovering.  Eventually was discharged on Lasix 40 mg daily and Aldactone 100 mg a day.  I had been monitoring her renal function and she had a rise of BUN to the 40s and increase in her creatinine.  I had her stop the diuretics and repeat blood work the following week showed normalization of her renal function.  She did have some recurrent ascites that led to large-volume paracentesis on June 9.  No evidence of SBP.  She also had a chest x-ray at that time showing a moderate pleural effusion on the right persisted.  Generally she is much better than when she was hospitalized, her ascites is not as tight, her effusion is not as bad, she is not requiring any supplemental oxygen.  She has no lower extremity edema.  Daughter brought her to the clinic visit today.  She states she thinks the patient is still drinking.  The patient became frustrated in the office and tells me she  has not been drinking at all since her hospitalization.  She is trying to watch her salt intake.  She is been taking lactulose twice daily and no reported symptoms of encephalopathy.  Most recent endoscopies: EGD 07/07/18 - 4 bands placed EGD 07/27/18 - 3 bands placed EGD 08/30/18 - 3 bands placed  EGD 11/08/18 -no varices amenable to banding, portal hypertensive gastritis EGD 01/31/19 - portal hypertensive gastritis, no varices   EGD 08/01/19 - There was extensive scarring in the distal esophagus secondary to prior banding, but no obvious varices noted today. The exam of the esophagus was otherwise normal. Diffuse severely congested mucosa was found in the gastric antrum secondary to portal hypertension.. The exam of the stomach was otherwise normal. No gastric varices. The duodenal bulb and second portion of the duodenum were normal.     Colonoscopy 08/01/19 - Preparation of the colon was fair - most of the colon was well visualized - a small portion of the transverse colon, and then the splenic flexure / proximal descending colon had some residual stool that could not be cleared but nothing obvious noted in these areas. - Hemorrhoids found on perianal exam. - The examined portion of the ileum was normal. - Diffuse colonic congestion / edema in the entire examined colon from portal hypertension. - Internal hemorrhoids. - The examination was otherwise normal.     EGD 08/07/20: Esophagogastric landmarks identified. - Normal esophagus otherwise - scarring from  prior banding noted but appreciable varices. - Antral gastritis with an erosion. - Normal stomach otherwise - biopsies taken to rule out H pylori - A single duodenal polyp. Resected and retrieved. - Normal duodenum otherwise     RUQ [place] 12/27/20 - IMPRESSION: 1. Cirrhosis without evidence of focal hepatic lesion. 2. Moderate to large volume abdominopelvic ascites   EGD 08/22/21: Esophagogastric landmarks identified. - Trace esophageal  varices in the lower esophagus without any high risk stigmata. - Multiple areas of scarring from prior banding - A large amount of food (residue) in the stomach, exam of the stomach and duodenum not performed once this was seen and procedure stopped.    Past Medical History:  Diagnosis Date   Alcoholic liver disease (HCC)    Chronic back pain    on disability   Cirrhosis of liver (HCC)    Esophageal varices (HCC)    GERD (gastroesophageal reflux disease)    Headache    HEPATIC Hydrothorax    S/P tips   Jaundice      Past Surgical History:  Procedure Laterality Date   BACK SURGERY     L3-L4   BIOPSY  08/07/2020   Procedure: BIOPSY;  Surgeon: Yetta Flock, MD;  Location: [facility];  Service: Gastroenterology;;   COLONOSCOPY WITH PROPOFOL N/A 08/01/2019   Procedure: COLONOSCOPY WITH PROPOFOL;  Surgeon: Yetta Flock, MD;  Location: [facility];  Service: Gastroenterology;  Laterality: N/A;   ESOPHAGEAL BANDING  04/25/2018   Procedure: ESOPHAGEAL BANDING;  Surgeon: Yetta Flock, MD;  Location: [facility];  Service: Gastroenterology;;   ESOPHAGEAL BANDING  04/27/2018   Procedure: ESOPHAGEAL BANDING;  Surgeon: Milus Banister, MD;  Location: [place];  Service: Endoscopy;;   ESOPHAGEAL BANDING  06/02/2018   Procedure: ESOPHAGEAL BANDING;  Surgeon: Doran Stabler, MD;  Location: [place];  Service: Gastroenterology;;   ESOPHAGEAL BANDING N/A 07/07/2018   Procedure: ESOPHAGEAL BANDING;  Surgeon: Yetta Flock, MD;  Location: [facility];  Service: Gastroenterology;  Laterality: N/A;   ESOPHAGEAL BANDING  07/27/2018   Procedure: ESOPHAGEAL BANDING;  Surgeon: Yetta Flock, MD;  Location: [facility];  Service: Gastroenterology;;   ESOPHAGEAL BANDING  08/30/2018   Procedure: ESOPHAGEAL BANDING;  Surgeon: Yetta Flock, MD;  Location: [facility];  Service: Gastroenterology;;   ESOPHAGOGASTRODUODENOSCOPY N/A 04/27/2018   Procedure:  ESOPHAGOGASTRODUODENOSCOPY (EGD);  Surgeon: Milus Banister, MD;  Location: [facility] ENDOSCOPY;  Service: Endoscopy;  Laterality: N/A;   ESOPHAGOGASTRODUODENOSCOPY (EGD) WITH PROPOFOL N/A 04/25/2018   Procedure: ESOPHAGOGASTRODUODENOSCOPY (EGD) WITH PROPOFOL;  Surgeon: Yetta Flock, MD;  Location: [place];  Service: Gastroenterology;  Laterality: N/A;   ESOPHAGOGASTRODUODENOSCOPY (EGD) WITH PROPOFOL N/A 06/02/2018   Procedure: ESOPHAGOGASTRODUODENOSCOPY (EGD) WITH PROPOFOL;  Surgeon: Doran Stabler, MD;  Location: [place];  Service: Gastroenterology;  Laterality: N/A;   ESOPHAGOGASTRODUODENOSCOPY (EGD) WITH PROPOFOL N/A 07/07/2018   Procedure: ESOPHAGOGASTRODUODENOSCOPY (EGD) WITH PROPOFOL;  Surgeon: Yetta Flock, MD;  [facility];  Service: Gastroenterology;  Laterality: N/A;   ESOPHAGOGASTRODUODENOSCOPY (EGD) WITH PROPOFOL N/A 07/27/2018   Procedure: ESOPHAGOGASTRODUODENOSCOPY (EGD) WITH PROPOFOL;  Surgeon: Yetta Flock, MD;  [facility];  Service: Gastroenterology;  Laterality: N/A;   ESOPHAGOGASTRODUODENOSCOPY (EGD) WITH PROPOFOL N/A 08/30/2018   Procedure: ESOPHAGOGASTRODUODENOSCOPY (EGD) WITH PROPOFOL;  Surgeon: Yetta Flock, MD;  [facility];  Service: Gastroenterology;  Laterality: N/A;   ESOPHAGOGASTRODUODENOSCOPY (EGD) WITH PROPOFOL N/A 11/08/2018   Procedure: ESOPHAGOGASTRODUODENOSCOPY (EGD) WITH PROPOFOL;  Surgeon: Yetta Flock, MD;  [facility]: [facility];  Service: Gastroenterology;  Laterality: N/A;   ESOPHAGOGASTRODUODENOSCOPY (EGD) WITH PROPOFOL N/A 01/31/2019   Procedure: ESOPHAGOGASTRODUODENOSCOPY (EGD) WITH PROPOFOL;  Surgeon: Yetta Flock, MD;  Location: [facility];  Service: Gastroenterology;  Laterality: N/A;   ESOPHAGOGASTRODUODENOSCOPY (EGD) WITH PROPOFOL N/A 08/01/2019   Procedure: ESOPHAGOGASTRODUODENOSCOPY (EGD) WITH PROPOFOL;  Surgeon: Yetta Flock, MD;  [facility]: [facility];   Service: Gastroenterology;  Laterality: N/A;   ESOPHAGOGASTRODUODENOSCOPY (EGD) WITH PROPOFOL N/A 08/07/2020   Procedure: ESOPHAGOGASTRODUODENOSCOPY (EGD) WITH PROPOFOL;  Surgeon: Yetta Flock, MD;  [facility];  Service: Gastroenterology;  Laterality: N/A;   ESOPHAGOGASTRODUODENOSCOPY (EGD) WITH PROPOFOL N/A 08/22/2021   Procedure: ESOPHAGOGASTRODUODENOSCOPY (EGD) WITH PROPOFOL;  Surgeon: Yetta Flock, MD;  Location: [facility];  Service: Gastroenterology;  Laterality: N/A;   IR INTRAVASCULAR ULTRASOUND NON CORONARY  11/05/2021   IR PARACENTESIS  04/26/2018   IR PARACENTESIS  05/18/2018   IR PARACENTESIS  05/31/2018   IR PARACENTESIS  06/30/2018   IR PARACENTESIS  08/05/2018   IR PARACENTESIS  08/31/2018   IR PARACENTESIS  10/08/2018   IR PARACENTESIS  10/28/2018   IR PARACENTESIS  02/14/2019   IR PARACENTESIS  03/09/2019   IR PARACENTESIS  04/05/2019   IR PARACENTESIS  05/03/2019   IR PARACENTESIS  06/20/2019   IR PARACENTESIS  07/19/2019   IR PARACENTESIS  08/19/2019   IR PARACENTESIS  09/27/2019   IR PARACENTESIS  10/07/2019   IR PARACENTESIS  10/28/2019   IR PARACENTESIS  11/16/2019   IR PARACENTESIS  12/30/2019   IR PARACENTESIS  01/16/2020   IR PARACENTESIS  02/07/2020   IR PARACENTESIS  03/06/2020   IR PARACENTESIS  04/04/2020   IR PARACENTESIS  04/17/2020   IR PARACENTESIS  05/04/2020   IR PARACENTESIS  06/07/2020   IR PARACENTESIS  06/28/2020   IR PARACENTESIS  07/27/2020   IR PARACENTESIS  08/20/2020   IR PARACENTESIS  09/13/2020   IR PARACENTESIS  10/04/2020   IR PARACENTESIS  10/25/2020   IR PARACENTESIS  11/07/2020   IR PARACENTESIS  11/30/2020   IR PARACENTESIS  12/13/2020   IR PARACENTESIS  01/04/2021   IR PARACENTESIS  01/25/2021   IR PARACENTESIS  02/20/2021   IR PARACENTESIS  03/20/2021   IR PARACENTESIS  04/18/2021   IR PARACENTESIS  05/14/2021   IR PARACENTESIS  06/10/2021   IR PARACENTESIS  06/27/2021   IR PARACENTESIS  07/17/2021   IR PARACENTESIS  08/12/2021    IR PARACENTESIS  08/23/2021   IR PARACENTESIS  09/13/2021   IR PARACENTESIS  10/02/2021   IR PARACENTESIS  10/22/2021   IR PARACENTESIS  11/01/2021   IR PARACENTESIS  11/29/2021   IR RADIOLOGIST EVAL & MGMT  12/23/2018   IR RADIOLOGIST EVAL & MGMT  10/11/2021   IR THORACENTESIS ASP PLEURAL SPACE W/IMG GUIDE  12/10/2018   IR THORACENTESIS ASP PLEURAL SPACE W/IMG GUIDE  01/21/2019   IR THORACENTESIS ASP PLEURAL SPACE W/IMG GUIDE  10/23/2021   IR THORACENTESIS ASP PLEURAL SPACE W/IMG GUIDE  10/25/2021   IR THORACENTESIS ASP PLEURAL SPACE W/IMG GUIDE  11/01/2021   IR TIPS  11/01/2021   IR US GUIDE VASC ACCESS RIGHT  11/01/2021   IR US GUIDE VASC ACCESS RIGHT  11/01/2021   IR US GUIDE VASC ACCESS RIGHT  11/01/2021   RADIOLOGY WITH ANESTHESIA N/A 11/01/2021   Procedure: TIPS;  Surgeon: Criselda Peaches, MD;  Location: [place];  Service: Radiology;  Laterality: N/A;   Family History  Problem Relation Age  of Onset   Colon cancer Neg Hx    Esophageal cancer Neg Hx    Pancreatic cancer Neg Hx    Stomach cancer Neg Hx    Social History   Tobacco Use   Smoking status: Some Days    Packs/day: 0.25    Years: 20.00    Total pack years: 5.00    Types: Cigarettes   Smokeless tobacco: Never   Tobacco comments:    5 a day  Vaping Use   Vaping Use: Former  Substance Use Topics   Alcohol use: Not Currently   Drug use: Not Currently   Current Outpatient Medications  Medication Sig Dispense Refill   cyanocobalamin 1000 MCG tablet Take 1 tablet (1,000 mcg total) by mouth daily. 30 tablet 0   furosemide (LASIX) 40 MG tablet Take 1 tablet (40 mg total) by mouth daily. 30 tablet 0   lactulose (CHRONULAC) 10 GM/15ML solution Take 30 mLs (20 g total) by mouth 2 (two) times daily. 946 mL 0   Multiple Vitamin (MULTIVITAMIN WITH MINERALS) TABS tablet Take 1 tablet by mouth daily.     pyridOXINE (B-6) 50 MG tablet Take 1 tablet (50 mg total) by mouth daily. 30 tablet 0   spironolactone (ALDACTONE) 100 MG tablet Take 1  tablet (100 mg total) by mouth daily. 30 tablet 0   thiamine 100 MG tablet Take 1 tablet (100 mg total) by mouth daily. 30 tablet 0   Vitamin D3 (VITAMIN D) 25 MCG tablet Take 1 tablet (1,000 Units total) by mouth daily. 30 tablet 0   Zinc Sulfate 220 (50 Zn) MG TABS Take 1 tablet (220 mg total) by mouth daily. 30 tablet 0   No current facility-administered medications for this visit.   Facility-Administered Medications Ordered in Other Visits  Medication Dose Route Frequency Provider Last Rate Last Admin   lactated ringers infusion    Continuous PRN Leonor Liv, CRNA   New Bag at 03/09/19 0800   lactated ringers infusion    Continuous PRN Leonor Liv, CRNA   New Bag at 03/09/19 5093   Allergies  Allergen Reactions   Contrast Media [Iodinated Contrast Media] Itching and Swelling    Needs 13-hr prep   Latex Itching, Swelling and Other (See Comments)    No breathing impairment, however   Sulfa Antibiotics Itching, Swelling and Other (See Comments)    No breathing impairment, however     Review of Systems: All systems reviewed and negative except where noted in HPI.   Lab Results  Component Value Date   WBC 5.4 11/06/2021   HGB 8.4 (L) 11/06/2021   HCT 25.0 (L) 11/06/2021   MCV 72.3 (L) 11/06/2021   PLT 102 (L) 11/06/2021    Lab Results  Component Value Date   CREATININE 0.73 11/25/2021   BUN 15 11/25/2021   NA 137 11/25/2021   K 4.0 11/25/2021   CL 107 11/25/2021   CO2 24 11/25/2021    Lab Results  Component Value Date   ALT 34 11/02/2021   AST 59 (H) 11/02/2021   ALKPHOS 87 11/02/2021   BILITOT 0.9 11/02/2021     Physical Exam: BP 132/78   Pulse (!) 105   Ht '5\' 4"'$  (1.626 m)   Wt 129 lb 4 oz (58.6 kg)   SpO2 96%   BMI 22.19 kg/m  Constitutional: Pleasant,well-developed, female in no acute distress. Abdominal: Soft, ascites, nontender.    Extremities: no edema Lymphadenopathy: No cervical adenopathy noted. Neurological: Alert  and oriented to person  place and time. Skin: Skin is warm and dry. No rashes noted. Psychiatric: Normal mood and affect. Behavior is normal.   ASSESSMENT AND PLAN:  56 year old female here for reassessment of the following:  Alcoholic cirrhosis with ascites Hepatic hydrothorax Esophageal varices  Patient has been dealing with refractory ascites and managing with large-volume paracentesis over time due to difficulty with diuretic compliance and effectiveness.  Was admitted with hepatic hydrothorax and respiratory distress in early May, ultimately got TIPS while she was hospitalized.  She is definitely improved from this and is recovering, although is taking some time to take effect and has needed paracentesis in the interim to help with her fluid status.  We had to back off her diuretics for a few weeks as she was becoming prerenal.  Last renal function looks much better.  She is generally doing much better than she had been and I think TIPS has helped her, no evidence of encephalopathy on lactulose.  Daughter raises question of alcohol resumption, patient adamantly denies that today, she understands stakes are high and that we recommend complete alcohol abstinence.  At this point time I think we will resume low-dose diuretics and repeat renal function labs next week.  Continue lactulose.  Now that she has had TIPS we may not need to survey her esophagus anymore.   Plan: - resume lasix at '20mg'$  / day and aldactone '50mg'$  / day - repeat BMET in one week - continue lactulose - complete alcohol abstinence - follow up one month  Jolly Mango, MD [facility]

## 2021-12-04 ENCOUNTER — Telehealth: Payer: Self-pay | Admitting: *Deleted

## 2021-12-04 LAB — PATHOLOGIST SMEAR REVIEW

## 2021-12-04 NOTE — Telephone Encounter (Signed)
   Telephone encounter was:  Unsuccessful.  12/04/2021 Name: Anhar Mcdermott MRN: [id number] DOB: [birth year]  Unsuccessful outbound call made today to assist with:  Food Insecurity and Financial Difficulties related to literacy and money management  Outreach Attempt:  2nd Attempt  A HIPAA compliant voice message was left requesting a return call.  Instructed patient to call back at follow up call made to patient to find out if she received resources left number for call back .  [facility], [facility]  [phone] [address] , [place] [address] Email : [email or web address]

## 2021-12-11 NOTE — Telephone Encounter (Signed)
Spoke to patient. Scheduled with Dr. Volanda Napoleon tomorrow at 3:30 to be evaluated.

## 2021-12-11 NOTE — Telephone Encounter (Signed)
Patient needs to be evaluated if she has not already done so.

## 2021-12-12 ENCOUNTER — Telehealth: Payer: Self-pay | Admitting: Gastroenterology

## 2021-12-12 ENCOUNTER — Ambulatory Visit (INDEPENDENT_AMBULATORY_CARE_PROVIDER_SITE_OTHER): Payer: Medicare HMO | Admitting: Family Medicine

## 2021-12-12 VITALS — BP 102/70 | HR 99 | Temp 97.9°F | Wt 143.2 lb

## 2021-12-12 DIAGNOSIS — R635 Abnormal weight gain: Secondary | ICD-10-CM

## 2021-12-12 DIAGNOSIS — K7031 Alcoholic cirrhosis of liver with ascites: Secondary | ICD-10-CM

## 2021-12-12 DIAGNOSIS — I8500 Esophageal varices without bleeding: Secondary | ICD-10-CM | POA: Diagnosis not present

## 2021-12-12 DIAGNOSIS — R69 Illness, unspecified: Secondary | ICD-10-CM | POA: Diagnosis not present

## 2021-12-12 NOTE — Progress Notes (Unsigned)
Subjective:    Patient ID: Katie Holland, female    DOB: 1966/02/18, 56 y.o.   MRN: [id number]  Chief Complaint  Patient presents with   Abdominal Pain    And swelling. Had tips procedure in May.     HPI Patient was seen today for ongoing concern.  Pt s/p TIPS procedure in May.  Last paracentesis June 9th without SBP.  Pt had f/u with GI 12/03/21 at which time she weighed 129 lbs, currently 143.2 lbs  Past Medical History:  Diagnosis Date   Alcoholic liver disease ([place])    Chronic back pain    on disability   Cirrhosis of liver (HCC)    Esophageal varices (HCC)    GERD (gastroesophageal reflux disease)    Headache    HEPATIC Hydrothorax    S/P tips   Jaundice     Allergies  Allergen Reactions   Contrast Media [Iodinated Contrast Media] Itching and Swelling    Needs 13-hr prep   Latex Itching, Swelling and Other (See Comments)    No breathing impairment, however   Sulfa Antibiotics Itching, Swelling and Other (See Comments)    No breathing impairment, however    ROS General: Denies fever, chills, night sweats, changes in weight, changes in appetite HEENT: Denies headaches, ear pain, changes in vision, rhinorrhea, sore throat CV: Denies CP, palpitations, SOB, orthopnea Pulm: Denies SOB, cough, wheezing GI: Denies abdominal pain, nausea, vomiting, diarrhea, constipation GU: Denies dysuria, hematuria, frequency, vaginal discharge Msk: Denies muscle cramps, joint pains Neuro: Denies weakness, numbness, tingling Skin: Denies rashes, bruising Psych: Denies depression, anxiety, hallucinations      Objective:    Blood pressure 102/70, pulse 99, temperature 97.9 F (36.6 C), temperature source Oral, weight 143 lb 3.2 oz (65 kg), SpO2 99 %.   Gen. Pleasant, well-nourished, in no distress, normal affect  *** HEENT: [place]/AT, face symmetric, conjunctiva clear, no scleral icterus, PERRLA, EOMI, nares patent without drainage, pharynx without erythema or exudate. Neck: No JVD, no  thyromegaly, no carotid bruits Lungs: no accessory muscle use, CTAB, no wheezes or rales Cardiovascular: RRR, no m/r/g, no ***peripheral edema Abdomen: BS present, soft, NT/ND, no hepatosplenomegaly. Musculoskeletal: No deformities, no cyanosis or clubbing, normal tone Neuro:  A&Ox3, CN II-XII intact, normal gait Skin:  Warm, no lesions/ rash   Wt Readings from Last 3 Encounters:  12/12/21 143 lb 3.2 oz (65 kg)  12/03/21 129 lb 4 oz (58.6 kg)  10/24/21 126 lb 11.2 oz (57.5 kg)    Lab Results  Component Value Date   WBC 5.4 11/06/2021   HGB 8.4 (L) 11/06/2021   HCT 25.0 (L) 11/06/2021   PLT 102 (L) 11/06/2021   GLUCOSE 98 11/25/2021   ALT 34 11/02/2021   AST 59 (H) 11/02/2021   NA 137 11/25/2021   K 4.0 11/25/2021   CL 107 11/25/2021   CREATININE 0.73 11/25/2021   BUN 15 11/25/2021   CO2 24 11/25/2021   TSH 0.319 (L) 10/22/2021   INR 1.5 (H) 11/02/2021    Assessment/Plan:  No diagnosis found.  F/u ***  Grier Mitts, MD

## 2021-12-13 ENCOUNTER — Other Ambulatory Visit: Payer: Self-pay

## 2021-12-13 ENCOUNTER — Encounter: Payer: Self-pay | Admitting: Family Medicine

## 2021-12-13 DIAGNOSIS — K7031 Alcoholic cirrhosis of liver with ascites: Secondary | ICD-10-CM

## 2021-12-13 LAB — CBC WITH DIFFERENTIAL/PLATELET
Basophils Absolute: 0 10*3/uL (ref 0.0–0.1)
Basophils Relative: 0.6 % (ref 0.0–3.0)
Eosinophils Absolute: 0.4 10*3/uL (ref 0.0–0.7)
Eosinophils Relative: 7.9 % — ABNORMAL HIGH (ref 0.0–5.0)
HCT: 31.4 % — ABNORMAL LOW (ref 36.0–46.0)
Hemoglobin: 9.8 g/dL — ABNORMAL LOW (ref 12.0–15.0)
Lymphocytes Relative: 20.3 % (ref 12.0–46.0)
Lymphs Abs: 1.1 10*3/uL (ref 0.7–4.0)
MCHC: 31.2 g/dL (ref 30.0–36.0)
MCV: 74 fl — ABNORMAL LOW (ref 78.0–100.0)
Monocytes Absolute: 0.6 10*3/uL (ref 0.1–1.0)
Monocytes Relative: 10.9 % (ref 3.0–12.0)
Neutro Abs: 3.4 10*3/uL (ref 1.4–7.7)
Neutrophils Relative %: 60.3 % (ref 43.0–77.0)
Platelets: 203 10*3/uL (ref 150.0–400.0)
RBC: 4.25 Mil/uL (ref 3.87–5.11)
RDW: 23.2 % — ABNORMAL HIGH (ref 11.5–15.5)
WBC: 5.6 10*3/uL (ref 4.0–10.5)

## 2021-12-13 LAB — COMPREHENSIVE METABOLIC PANEL
ALT: 20 U/L (ref 0–35)
AST: 31 U/L (ref 0–37)
Albumin: 2.8 g/dL — ABNORMAL LOW (ref 3.5–5.2)
Alkaline Phosphatase: 128 U/L — ABNORMAL HIGH (ref 39–117)
BUN: 13 mg/dL (ref 6–23)
CO2: 26 mEq/L (ref 19–32)
Calcium: 8.8 mg/dL (ref 8.4–10.5)
Chloride: 106 mEq/L (ref 96–112)
Creatinine, Ser: 0.7 mg/dL (ref 0.40–1.20)
GFR: 96.32 mL/min (ref >60.00)
Glucose, Bld: 96 mg/dL (ref 70–99)
Potassium: 4.2 mEq/L (ref 3.5–5.1)
Sodium: 138 mEq/L (ref 135–145)
Total Bilirubin: 1.2 mg/dL (ref 0.2–1.2)
Total Protein: 5.8 g/dL — ABNORMAL LOW (ref 6.0–8.3)

## 2021-12-13 LAB — PROTIME-INR
INR: 1.3 ratio — ABNORMAL HIGH (ref 0.8–1.0)
Prothrombin Time: 13.7 s — ABNORMAL HIGH (ref 9.6–13.1)

## 2021-12-13 MED ORDER — ALBUMIN HUMAN 25 % IV SOLN: 50.0000 g | Freq: Once | INTRAVENOUS | Status: DC

## 2021-12-13 NOTE — Telephone Encounter (Signed)
I am familiar with this patient after having seen her during the recent hospitalization, and I read Dr. Lanetta Inch most recent office note.  My recommendations:  Decrease spironolactone to 50 mg once daily and decrease furosemide to 20 mg once daily per Dr. Lanetta Inch recent office note recommendations. Reviewed the need for 2000 mg daily sodium restriction  Please arrange a therapeutic paracentesis for early next week. Maximum 5 L fluid removal, send the fluid for cell count Administer 50 g of 25% IV albumin during the procedure.  Since these orders will be placed under my name, please follow-up on the results of the cell count if the procedure is done on a day that both Dr. Adela Lank and I are out of the office.  (I will be out of the office all next week, and Dr. Adela Lank will return on Tuesday, June 27)  -Ellwood Dense, MD

## 2021-12-15 DIAGNOSIS — R402412 Glasgow coma scale score 13-15, at arrival to emergency department: Secondary | ICD-10-CM | POA: Diagnosis not present

## 2021-12-15 DIAGNOSIS — K7031 Alcoholic cirrhosis of liver with ascites: Secondary | ICD-10-CM | POA: Diagnosis not present

## 2021-12-15 DIAGNOSIS — R0602 Shortness of breath: Secondary | ICD-10-CM | POA: Diagnosis not present

## 2021-12-15 DIAGNOSIS — R140 Abdominal distension (gaseous): Secondary | ICD-10-CM | POA: Diagnosis not present

## 2021-12-15 DIAGNOSIS — R69 Illness, unspecified: Secondary | ICD-10-CM | POA: Diagnosis not present

## 2021-12-15 NOTE — Telephone Encounter (Signed)
Thanks Sherilyn Cooter.  It sounds like patient has continued to be noncompliant with her diuretics. Agree with paracentesis for symptomatic relief. I will await her fluid analysis.

## 2021-12-16 ENCOUNTER — Ambulatory Visit (HOSPITAL_COMMUNITY): Admission: RE | Admit: 2021-12-16 | Payer: Medicare HMO | Source: Ambulatory Visit

## 2021-12-18 ENCOUNTER — Other Ambulatory Visit: Payer: Self-pay | Admitting: Gastroenterology

## 2021-12-18 ENCOUNTER — Telehealth: Payer: Self-pay | Admitting: Gastroenterology

## 2021-12-18 ENCOUNTER — Other Ambulatory Visit: Payer: Self-pay | Admitting: Student

## 2021-12-18 ENCOUNTER — Ambulatory Visit (HOSPITAL_COMMUNITY)
Admission: RE | Admit: 2021-12-18 | Discharge: 2021-12-18 | Disposition: A | Discharge status: Home / Self Care | Payer: Medicare HMO | Source: Ambulatory Visit | Attending: Gastroenterology | Admitting: Gastroenterology

## 2021-12-18 DIAGNOSIS — K7031 Alcoholic cirrhosis of liver with ascites: Secondary | ICD-10-CM | POA: Insufficient documentation

## 2021-12-18 DIAGNOSIS — R188 Other ascites: Secondary | ICD-10-CM

## 2021-12-18 DIAGNOSIS — R69 Illness, unspecified: Secondary | ICD-10-CM | POA: Diagnosis not present

## 2021-12-18 HISTORY — PX: IR PARACENTESIS: IMG2679

## 2021-12-18 LAB — BODY FLUID CELL COUNT WITH DIFFERENTIAL
Eos, Fluid: 1 %
Lymphs, Fluid: 5 %
Monocyte-Macrophage-Serous Fluid: 70 % (ref 50–90)
Neutrophil Count, Fluid: 24 % (ref 0–25)
Total Nucleated Cell Count, Fluid: 62 cu mm (ref 0–1000)

## 2021-12-18 LAB — AMMONIA: Ammonia: 53 umol/L — ABNORMAL HIGH (ref 9–35)

## 2021-12-18 MED ORDER — LIDOCAINE HCL 1 % IJ SOLN
INTRAMUSCULAR | Status: AC
  Filled 2021-12-18: qty 20

## 2021-12-18 MED ORDER — ALBUMIN HUMAN 25 % IV SOLN
INTRAVENOUS | Status: AC
  Administered 2021-12-18: 50 g via INTRAVENOUS
  Filled 2021-12-18: qty 200

## 2021-12-18 MED ORDER — ALBUMIN HUMAN 25 % IV SOLN
50.0000 g | Freq: Once | INTRAVENOUS | Status: AC
  Filled 2021-12-18: qty 200

## 2021-12-18 MED ORDER — LIDOCAINE HCL (PF) 1 % IJ SOLN
INTRAMUSCULAR | Status: DC | PRN
  Administered 2021-12-18: 10 mL

## 2021-12-18 NOTE — Procedures (Signed)
PROCEDURE SUMMARY:  Patient presents to IR today without an appointment for a paracentesis.  Patient has recently developed increased ascites s/p recent TIPS 11/02/21.  She had required only 1 paracentesis 11/29/21 since TIPS creation, however multiple notes in chart over the past week concerning fluid build up and concern for noncompliance with medication.  Patient presents today in her usual state of health.  She reports she takes her medication daily and is adamant that she has not consumed alcohol recently. She has a hard time recounting an accurate medication regimen, but states that she keeps her meds in a pill box and takes what she is supposed to at the right time.  She is alert and oriented. No asterixis. States that she went to [place] this past weekend for her son's graduation and had to go to the ED for a paracentesis.  This is supported in [place] and a para is ordered, but no procedure note is available.  Ms.  Delira states 6L were removed.   Discussed with Dr. Serafina Royals.  Proceed with paracentesis today.  Resume 5L max with albumin.  Patient to get ammonia lab checked today with IV start.  Also ordered TIPS eval with liver duplex to assess patency in the setting of increased ascites.   Successful US guided paracentesis from right lateral abdomen.  Yielded 5.0 liters of yellow fluid.  No immediate complications.  Pt tolerated well.   Specimen was sent for labs.  EBL < 69m  KDocia BarrierPA-C 12/18/2021 5:00 PM

## 2021-12-18 NOTE — Telephone Encounter (Signed)
Patient called just to let you know she was on her way to the hospital to be "drained" again.  She was in [place] and had to be drained and is now having to be drained again.  She said she didn't think this would do it for her either, but she just wanted to inform you so you'd be aware.  Thank you.

## 2021-12-18 NOTE — Telephone Encounter (Signed)
Noted  

## 2021-12-18 NOTE — Telephone Encounter (Signed)
Looks like she had another paracentesis in [place] and then another today as ordered per Dr. Loletha Carrow, fluid studies pending. This is because she has been noncompliant with her diuretics, as discussed with her daughter. Difficult situation.

## 2021-12-20 ENCOUNTER — Telehealth: Payer: Self-pay | Admitting: Family Medicine

## 2021-12-20 LAB — PATHOLOGIST SMEAR REVIEW

## 2021-12-20 NOTE — Telephone Encounter (Signed)
Reschedule her OT discharge, requesting orders/authorization to change from week to week

## 2021-12-20 NOTE — Telephone Encounter (Signed)
Katie Holland from [facility] call and stated pt missed her PT for this week because she is out of town and stated she will be back next week.

## 2021-12-25 NOTE — Telephone Encounter (Signed)
Ok

## 2021-12-26 NOTE — Telephone Encounter (Signed)
Spoke with Will and inform him " okay" per Dr. Volanda Napoleon in regards to verbal order.

## 2021-12-27 ENCOUNTER — Other Ambulatory Visit: Payer: Self-pay | Admitting: Interventional Radiology

## 2021-12-27 ENCOUNTER — Ambulatory Visit
Admission: RE | Admit: 2021-12-27 | Discharge: 2021-12-27 | Disposition: A | Discharge status: Home / Self Care | Payer: Medicare Other | Source: Ambulatory Visit | Attending: Interventional Radiology | Admitting: Interventional Radiology

## 2021-12-27 ENCOUNTER — Other Ambulatory Visit: Payer: Medicare HMO

## 2021-12-27 DIAGNOSIS — Z8709 Personal history of other diseases of the respiratory system: Secondary | ICD-10-CM | POA: Diagnosis not present

## 2021-12-27 DIAGNOSIS — Z9689 Presence of other specified functional implants: Secondary | ICD-10-CM | POA: Diagnosis not present

## 2021-12-27 DIAGNOSIS — K766 Portal hypertension: Secondary | ICD-10-CM | POA: Diagnosis not present

## 2021-12-27 DIAGNOSIS — K7290 Hepatic failure, unspecified without coma: Secondary | ICD-10-CM

## 2021-12-27 HISTORY — PX: IR RADIOLOGIST EVAL & MGMT: IMG5224

## 2021-12-27 NOTE — Progress Notes (Signed)
Chief Complaint: Patient was seen in consultation today for EtOH cirrhosis complicated by recurrent large volume ascites at the request of Lenette Rau K  Referring Physician(s): Cavion Faiola K  History of Present Illness: Katie Holland is a 56 y.o. female who underwent TIPS creation on 11/02/21.  She reports that for the first few weeks, she noticed a much slower re accumulation of her ascites.  She was feeling better and required paracentesis on 6/9 and again on 6/28.  Unfortunately, she now feels like her fluid is accumulating faster again.  She feels fatigued and not great.  She denies fever, chills or other new symptoms.   Past Medical History:  Diagnosis Date   Alcoholic liver disease (HCC)    Chronic back pain    on disability   Cirrhosis of liver (HCC)    Esophageal varices (HCC)    GERD (gastroesophageal reflux disease)    Headache    HEPATIC Hydrothorax    S/P tips   Jaundice     Past Surgical History:  Procedure Laterality Date   BACK SURGERY     L3-L4   BIOPSY  08/07/2020   Procedure: BIOPSY;  Surgeon: Yetta Flock, MD;  Location: [facility];  Service: Gastroenterology;;   COLONOSCOPY WITH PROPOFOL N/A 08/01/2019   Procedure: COLONOSCOPY WITH PROPOFOL;  Surgeon: Yetta Flock, MD;  Location: [facility];  Service: Gastroenterology;  Laterality: N/A;   ESOPHAGEAL BANDING  04/25/2018   Procedure: ESOPHAGEAL BANDING;  Surgeon: Yetta Flock, MD;  Location: [facility]OSCOPY;  Service: Gastroenterology;;   ESOPHAGEAL BANDING  04/27/2018   Procedure: ESOPHAGEAL BANDING;  Surgeon: Milus Banister, MD;  Location: [place];  Service: Endoscopy;;   ESOPHAGEAL BANDING  06/02/2018   Procedure: ESOPHAGEAL BANDING;  Surgeon: Doran Stabler, MD;  Location: [place];  Service: Gastroenterology;;   ESOPHAGEAL BANDING N/A 07/07/2018   Procedure: ESOPHAGEAL BANDING;  Surgeon: Yetta Flock, MD;  Location: [facility];  Service:  Gastroenterology;  Laterality: N/A;   ESOPHAGEAL BANDING  07/27/2018   Procedure: ESOPHAGEAL BANDING;  Surgeon: Yetta Flock, MD;  Location: [facility];  Service: Gastroenterology;;   ESOPHAGEAL BANDING  08/30/2018   Procedure: ESOPHAGEAL BANDING;  Surgeon: Yetta Flock, MD;  Location: [facility];  Service: Gastroenterology;;   ESOPHAGOGASTRODUODENOSCOPY N/A 04/27/2018   Procedure: ESOPHAGOGASTRODUODENOSCOPY (EGD);  Surgeon: Milus Banister, MD;  Location: [facility] ENDOSCOPY;  Service: Endoscopy;  Laterality: N/A;   ESOPHAGOGASTRODUODENOSCOPY (EGD) WITH PROPOFOL N/A 04/25/2018   Procedure: ESOPHAGOGASTRODUODENOSCOPY (EGD) WITH PROPOFOL;  Surgeon: Yetta Flock, MD;  Location: [place];  Service: Gastroenterology;  Laterality: N/A;   ESOPHAGOGASTRODUODENOSCOPY (EGD) WITH PROPOFOL N/A 06/02/2018   Procedure: ESOPHAGOGASTRODUODENOSCOPY (EGD) WITH PROPOFOL;  Surgeon: Doran Stabler, MD;  Location: [place];  Service: Gastroenterology;  Laterality: N/A;   ESOPHAGOGASTRODUODENOSCOPY (EGD) WITH PROPOFOL N/A 07/07/2018   Procedure: ESOPHAGOGASTRODUODENOSCOPY (EGD) WITH PROPOFOL;  Surgeon: Yetta Flock, MD;  [facility];  Service: Gastroenterology;  Laterality: N/A;   ESOPHAGOGASTRODUODENOSCOPY (EGD) WITH PROPOFOL N/A 07/27/2018   Procedure: ESOPHAGOGASTRODUODENOSCOPY (EGD) WITH PROPOFOL;  Surgeon: Yetta Flock, MD;  Location[facility];  Service: Gastroenterology;  Laterality: N/A;   ESOPHAGOGASTRODUODENOSCOPY (EGD) WITH PROPOFOL N/A 08/30/2018   Procedure: ESOPHAGOGASTRODUODENOSCOPY (EGD) WITH PROPOFOL;  Surgeon: Yetta Flock, MD;  [facility];  Service: Gastroenterology;  Laterality: N/A;   ESOPHAGOGASTRODUODENOSCOPY (EGD) WITH PROPOFOL N/A 11/08/2018   Procedure: ESOPHAGOGASTRODUODENOSCOPY (EGD) WITH PROPOFOL;  Surgeon: Yetta Flock, MD;  [facility]: [facility];  Service: Gastroenterology;  Laterality: N/A;    ESOPHAGOGASTRODUODENOSCOPY (EGD) WITH PROPOFOL N/A 01/31/2019   Procedure: ESOPHAGOGASTRODUODENOSCOPY (EGD) WITH PROPOFOL;  Surgeon: Yetta Flock, MD;  [facility];  Service: Gastroenterology;  Laterality: N/A;   ESOPHAGOGASTRODUODENOSCOPY (EGD) WITH PROPOFOL N/A 08/01/2019   Procedure: ESOPHAGOGASTRODUODENOSCOPY (EGD) WITH PROPOFOL;  Surgeon: Yetta Flock, MD;  [facility]: [facility];  Service: Gastroenterology;  Laterality: N/A;   ESOPHAGOGASTRODUODENOSCOPY (EGD) WITH PROPOFOL N/A 08/07/2020   Procedure: ESOPHAGOGASTRODUODENOSCOPY (EGD) WITH PROPOFOL;  Surgeon: Yetta Flock, MD;  [facility];  Service: Gastroenterology;  Laterality: N/A;   ESOPHAGOGASTRODUODENOSCOPY (EGD) WITH PROPOFOL N/A 08/22/2021   Procedure: ESOPHAGOGASTRODUODENOSCOPY (EGD) WITH PROPOFOL;  Surgeon: Yetta Flock, MD;  Location: [facility];  Service: Gastroenterology;  Laterality: N/A;   IR INTRAVASCULAR ULTRASOUND NON CORONARY  11/05/2021   IR PARACENTESIS  04/26/2018   IR PARACENTESIS  05/18/2018   IR PARACENTESIS  05/31/2018   IR PARACENTESIS  06/30/2018   IR PARACENTESIS  08/05/2018   IR PARACENTESIS  08/31/2018   IR PARACENTESIS  10/08/2018   IR PARACENTESIS  10/28/2018   IR PARACENTESIS  02/14/2019   IR PARACENTESIS  03/09/2019   IR PARACENTESIS  04/05/2019   IR PARACENTESIS  05/03/2019   IR PARACENTESIS  06/20/2019   IR PARACENTESIS  07/19/2019   IR PARACENTESIS  08/19/2019   IR PARACENTESIS  09/27/2019   IR PARACENTESIS  10/07/2019   IR PARACENTESIS  10/28/2019   IR PARACENTESIS  11/16/2019   IR PARACENTESIS  12/30/2019   IR PARACENTESIS  01/16/2020   IR PARACENTESIS  02/07/2020   IR PARACENTESIS  03/06/2020   IR PARACENTESIS  04/04/2020   IR PARACENTESIS  04/17/2020   IR PARACENTESIS  05/04/2020   IR PARACENTESIS  06/07/2020   IR PARACENTESIS  06/28/2020   IR PARACENTESIS  07/27/2020   IR PARACENTESIS  08/20/2020   IR PARACENTESIS  09/13/2020   IR PARACENTESIS  10/04/2020   IR  PARACENTESIS  10/25/2020   IR PARACENTESIS  11/07/2020   IR PARACENTESIS  11/30/2020   IR PARACENTESIS  12/13/2020   IR PARACENTESIS  01/04/2021   IR PARACENTESIS  01/25/2021   IR PARACENTESIS  02/20/2021   IR PARACENTESIS  03/20/2021   IR PARACENTESIS  04/18/2021   IR PARACENTESIS  05/14/2021   IR PARACENTESIS  06/10/2021   IR PARACENTESIS  06/27/2021   IR PARACENTESIS  07/17/2021   IR PARACENTESIS  08/12/2021   IR PARACENTESIS  08/23/2021   IR PARACENTESIS  09/13/2021   IR PARACENTESIS  10/02/2021   IR PARACENTESIS  10/22/2021   IR PARACENTESIS  11/01/2021   IR PARACENTESIS  11/29/2021   IR PARACENTESIS  12/18/2021   IR RADIOLOGIST EVAL & MGMT  12/23/2018   IR RADIOLOGIST EVAL & MGMT  10/11/2021   IR THORACENTESIS ASP PLEURAL SPACE W/IMG GUIDE  12/10/2018   IR THORACENTESIS ASP PLEURAL SPACE W/IMG GUIDE  01/21/2019   IR THORACENTESIS ASP PLEURAL SPACE W/IMG GUIDE  10/23/2021   IR THORACENTESIS ASP PLEURAL SPACE W/IMG GUIDE  10/25/2021   IR THORACENTESIS ASP PLEURAL SPACE W/IMG GUIDE  11/01/2021   IR TIPS  11/01/2021   IR US GUIDE VASC ACCESS RIGHT  11/01/2021   IR US GUIDE VASC ACCESS RIGHT  11/01/2021   IR US GUIDE VASC ACCESS RIGHT  11/01/2021   RADIOLOGY WITH ANESTHESIA N/A 11/01/2021   Procedure: TIPS;  Surgeon: Criselda Peaches, MD;  Location: [place];  Service: Radiology;  Laterality: N/A;    Allergies: Contrast media [iodinated contrast media],  Latex, and Sulfa antibiotics  Medications: Prior to Admission medications   Not on File     Family History  Problem Relation Age of Onset   Colon cancer Neg Hx    Esophageal cancer Neg Hx    Pancreatic cancer Neg Hx    Stomach cancer Neg Hx     Social History   Socioeconomic History   Marital status: Single    Spouse name: Not on file   Number of children: 8   Years of education: Not on file   Highest education level: Not on file  Occupational History   Occupation: disability  Tobacco Use   Smoking status: Some Days    Packs/day: 0.25     Years: 20.00    Total pack years: 5.00    Types: Cigarettes   Smokeless tobacco: Never   Tobacco comments:    5 a day  Vaping Use   Vaping Use: Former  Substance and Sexual Activity   Alcohol use: Not Currently   Drug use: Not Currently   Sexual activity: Not Currently  Other Topics Concern   Not on file  Social History Narrative   Not on file   Social Determinants of Health   Financial Resource Strain: High Risk (11/22/2021)   Overall Financial Resource Strain (CARDIA)    Difficulty of Paying Living Expenses: Hard  Food Insecurity: No Food Insecurity (11/19/2021)   Hunger Vital Sign    Worried About Running Out of Food in the Last Year: Never true    Ran Out of Food in the Last Year: Never true  Transportation Needs: No Transportation Needs (11/19/2021)   PRAPARE - Hydrologist (Medical): No    Lack of Transportation (Non-Medical): No  Physical Activity: Insufficiently Active (11/19/2021)   Exercise Vital Sign    Days of Exercise per Week: 2 days    Minutes of Exercise per Session: 20 min  Stress: No Stress Concern Present (12/18/2020)   [place]    Feeling of Stress : Not at all  Social Connections: Socially Isolated (11/19/2021)   Social Connection and Isolation Panel [NHANES]    Frequency of Communication with Friends and Family: Three times a week    Frequency of Social Gatherings with Friends and Family: Twice a week    Attends Religious Services: Never    Marine scientist or Organizations: No    Attends Music therapist: Never    Marital Status: Divorced     Review of Systems: A 12 point ROS discussed and pertinent positives are indicated in the HPI above.  All other systems are negative.  Review of Systems  Vital Signs: There were no vitals taken for this visit.    Physical Exam Constitutional:      General: She is not in acute distress.     Appearance: Normal appearance. She is not ill-appearing.  HENT:     Head: Normocephalic and atraumatic.  Eyes:     General: No scleral icterus. Cardiovascular:     Rate and Rhythm: Normal rate.  Pulmonary:     Effort: Pulmonary effort is normal.  Abdominal:     General: There is distension.     Tenderness: There is no abdominal tenderness. There is no guarding.  Skin:    General: Skin is warm and dry.  Neurological:     Mental Status: She is alert and oriented to person, place, and time.  Psychiatric:  Behavior: Behavior normal.       Imaging: US ABDOMINAL PELVIC ART/VENT FLOW DOPPLER  Result Date: 12/27/2021 CLINICAL DATA:  56 year old female with alcoholic cirrhosis complicated by recurrent large volume ascites and hepatic hydrothorax. She underwent TIPS creation on 11/01/2021. EXAM: DUPLEX ULTRASOUND OF LIVER AND TIPS SHUNT TECHNIQUE: Color and duplex Doppler ultrasound was performed to evaluate the hepatic in-flow and out-flow vessels. COMPARISON:  None Available. FINDINGS: Portal Vein Velocities Main:  37 cm/sec TIPS Stent Velocities Proximal:  0 cm/sec Mid:  0 Distal:  0 cm/sec IVC: Present and patent with normal respiratory phasicity. Hepatic Vein Velocities Right:   cm/sec Mid:  44 cm/sec Left:  31 cm/sec Splenic Vein: Not well seen Superior Mesenteric Vein: Not well seen Ascites: Large volume ascites. Varices: Not visualized. Other findings: Cirrhotic liver morphology. IMPRESSION: 1. No flow visualized within the recently placed TIPS stentgraft concerning for occlusion. 2. Large volume ascites. Electronically Signed   By: Jacqulynn Cadet M.D.   On: 12/27/2021 10:50   IR Paracentesis  Result Date: 12/19/2021 INDICATION: Patient with history of cirrhosis, status post tips procedure 11/02/2021 by Dr. Laurence Ferrari. Presents with recurrent ascites for diagnostic and therapeutic paracentesis. EXAM: ULTRASOUND GUIDED DIAGNOSTIC AND THERAPEUTIC PARACENTESIS MEDICATIONS: 10 mL 1%  lidocaine COMPLICATIONS: None immediate. PROCEDURE: Informed written consent was obtained from the patient after a discussion of the risks, benefits and alternatives to treatment. A timeout was performed prior to the initiation of the procedure. Initial ultrasound scanning demonstrates a large amount of ascites within the right lower abdominal quadrant. The right lower abdomen was prepped and draped in the usual sterile fashion. 1% lidocaine was used for local anesthesia. Following this, a 19 gauge, 7-cm, Yueh catheter was introduced. An ultrasound image was saved for documentation purposes. The paracentesis was performed. The catheter was removed and a dressing was applied. The patient tolerated the procedure well without immediate post procedural complication. FINDINGS: A total of approximately 5.0 liters of clear, yellow fluid was removed. Samples were sent to the laboratory as requested by the clinical team. IMPRESSION: Successful ultrasound-guided paracentesis yielding 5.0 liters of peritoneal fluid. Read by: Brynda Greathouse PA-C Electronically Signed   By: Lucrezia Europe M.D.   On: 12/19/2021 10:12   IR Paracentesis  Result Date: 11/29/2021 INDICATION: 4 weeks s/p TIPS, recurrent ascites EXAM: ULTRASOUND GUIDED RLQ PARACENTESIS MEDICATIONS: None. COMPLICATIONS: None immediate. PROCEDURE: Informed written consent was obtained from the patient after a discussion of the risks, benefits and alternatives to treatment. A timeout was performed prior to the initiation of the procedure. Initial ultrasound scanning demonstrates a large amount of ascites within the right lower abdominal quadrant. The right lower abdomen was prepped and draped in the usual sterile fashion. 1% lidocaine was used for local anesthesia. Following this, a 19 gauge, 7-cm, Yueh catheter was introduced. An ultrasound image was saved for documentation purposes. The paracentesis was performed. The catheter was removed and a dressing was applied. The  patient tolerated the procedure well without immediate post procedural complication. Patient received post-procedure intravenous albumin; see nursing notes for details. FINDINGS: A total of approximately 7.5L of ascitic fluid was removed. Samples were sent to the laboratory as requested by the clinical team. IMPRESSION: Successful ultrasound-guided paracentesis yielding 7.5 liters of peritoneal fluid. Performed and Read by Pasty Spillers, PA-C Electronically Signed   By: Ruthann Cancer M.D.   On: 11/29/2021 14:08   DG Chest 1 View  Result Date: 11/29/2021 CLINICAL DATA:  Right pneumothorax.  Status post paracentesis. EXAM: CHEST  1 VIEW COMPARISON:  AP chest 11/08/2021 FINDINGS: Moderate right pleural effusion with flat surface seen extending up approximately 40% of the right hemithorax. The visualized right upper lung is clear. The left lung is clear. No left pleural effusion. No pneumothorax is seen. Cardiac silhouette and mediastinal contours are within normal limits. No acute skeletal abnormality. Mild right glenohumeral osteoarthritis. IMPRESSION: No pneumothorax is seen.  Moderate right pleural effusion. Electronically Signed   By: Yvonne Kendall M.D.   On: 11/29/2021 13:28    Labs:  CBC: Recent Labs    11/01/21 1814 11/02/21 0152 11/06/21 0050 12/12/21 1623  WBC 11.5* 11.4* 5.4 5.6  HGB 9.7* 10.0* 8.4* 9.8*  HCT 31.7* 30.8* 25.0* 31.4*  PLT 144* 141* 102* 203.0    COAGS: Recent Labs    10/24/21 0750 11/01/21 0538 11/02/21 0152 12/12/21 1623  INR 1.4* 1.1 1.5* 1.3*    BMP: Recent Labs    11/02/21 0152 11/03/21 0101 11/05/21 0840 11/06/21 0050 11/15/21 1507 11/25/21 1428 12/12/21 1623  NA 134* 133* 130* 130* 131* 137 138  K 4.7 4.8 4.3 4.5 4.4 4.0 4.2  CL 100 100 97* 97* 97 107 106  CO2 '26 27 27 27 25 24 26  '$ GLUCOSE 130* 122* 131* 109* 93 98 96  BUN 23* 24* 13 9 44* 15 13  CALCIUM 9.4 8.9 8.4* 8.5* 9.1 8.6 8.8  CREATININE 0.77 0.81 0.71 0.58 1.27* 0.73 0.70   GFRNONAA >60 >60 >60 >60  --   --   --     LIVER FUNCTION TESTS: Recent Labs    10/30/21 0039 11/01/21 0538 11/02/21 0152 12/12/21 1623  BILITOT 0.7 1.2 0.9 1.2  AST 32 37 59* 31  ALT 18 22 34 20  ALKPHOS 105 88 87 128*  PROT 5.4* 6.0* 6.0* 5.8*  ALBUMIN 2.3* 2.6* 3.0* 2.8*    TUMOR MARKERS: Recent Labs    08/05/21 0935  AFPTM 1.4    Assessment and Plan:  56 year-old female with EtOH cirrhosis complicated by recurrent large volume ascites.  Unfortunately, it appears her recently placed TIPS is occluded.  I suspect the large portosystemic collateral may have competed the TIPS for flow leading to thrombosis.   1.) Schedule for TIPS revision with Dr. Laurence Ferrari at [facility]  2.) Please also refer to [place] at [facility] for transplant evaluation    Electronically Signed: Criselda Peaches 12/27/2021, 11:00 AM   I spent a total of  15 Minutes in face to face in clinical consultation, greater than 50% of which was counseling/coordinating care for EtOH cirrhosis complicated by ascites.

## 2021-12-30 ENCOUNTER — Telehealth: Payer: Self-pay | Admitting: Gastroenterology

## 2021-12-30 DIAGNOSIS — Z95828 Presence of other vascular implants and grafts: Secondary | ICD-10-CM

## 2021-12-30 NOTE — Telephone Encounter (Signed)
Patient called requesting a paracentesis.  Please call patient and advise.  Thank you.

## 2021-12-30 NOTE — Telephone Encounter (Signed)
Patient needs to be scheduled for TIPS revision with IR since her TIPS is occluded. Would be okay to do paracentesis at the same time as her TIPS revision. Would recommend administering albumin if >5 L is taken.     Lm on vm for patient to return call.

## 2021-12-30 NOTE — Telephone Encounter (Signed)
Dr. Lorenso Courier as DOD AM of 12/30/21, please advise. Thanks  Dr. Doyne Keel patient with a hx of alcoholic cirrhosis with ascites, s/p TIPS in 10/2021, pt noncompliant with diuretics  Pt calling to request therapeutic paracentesis.

## 2021-12-31 ENCOUNTER — Telehealth: Payer: Self-pay | Admitting: Family Medicine

## 2021-12-31 ENCOUNTER — Ambulatory Visit: Payer: Medicare HMO

## 2021-12-31 ENCOUNTER — Telehealth: Payer: Self-pay

## 2021-12-31 NOTE — Telephone Encounter (Signed)
Pt's husband called into the office today with the patient on the line. Pt states that she has been in the house for 2 months and she looks like she is pregnant. Pt states that she is miserable. Pt states that she came up here yesterday and was told to go to ED or call back today. I informed pt of Dr. Libby Maw recommendations. I told her that if she has that much abdominal discomfort and swelling she needs to go to the nearest ED for evlaution. I told her that IR will contact her directly to set up a TIPS revision. Pt stated that she was tired of going back and forth from [place] to [place]. I told the pt that if she is in [place] she could go to that ED but her IR evaluation will still be in [place]. Pt verbalized understanding and had no concerns at the end of the call.   IR eval and management order in epic.

## 2021-12-31 NOTE — Telephone Encounter (Signed)
Collier Salina with [facility] is stating Pt is now Discharged from services as of 12/31/21   Since 11/15/21 - Only 2 PT and 2 OT Pt has been unavailable  Pt is now in [place] with family

## 2021-12-31 NOTE — Telephone Encounter (Signed)
This nurse called patient for scheduled telephonic AWV. She said that this was not a good time because she was out of town with her family in [place]. Told her we will call to reschedule for another time.

## 2021-12-31 NOTE — Telephone Encounter (Signed)
Called [facility] IR scheduling [phone]). Anderson Malta stated that Dr. Geroge Baseman wanted pt scheduled for TIPS revision, no need for consult appt. Anderson Malta stated that she would contact the patient directly to set up this appt.

## 2022-01-01 ENCOUNTER — Telehealth: Payer: Self-pay | Admitting: Internal Medicine

## 2022-01-01 ENCOUNTER — Other Ambulatory Visit: Payer: Medicare HMO

## 2022-01-01 ENCOUNTER — Other Ambulatory Visit (HOSPITAL_COMMUNITY): Payer: Self-pay | Admitting: Physician Assistant

## 2022-01-01 DIAGNOSIS — K7031 Alcoholic cirrhosis of liver with ascites: Secondary | ICD-10-CM

## 2022-01-01 MED ORDER — DIPHENHYDRAMINE HCL 50 MG PO TABS: 50.0000 mg | ORAL_TABLET | Freq: Once | ORAL | 0 refills | Status: DC

## 2022-01-01 MED ORDER — PREDNISONE 50 MG PO TABS: ORAL_TABLET | ORAL | 0 refills | Status: DC

## 2022-01-01 NOTE — Progress Notes (Signed)
Pt was called and notified that pre-medication for her 01/02/22 procedure has been electronically sent to [facility] on [address]. Pt verbalized understanding to start medications the night prior to procedure.     Narda Rutherford, AGNP-BC 01/01/2022, 10:45 AM

## 2022-01-02 ENCOUNTER — Other Ambulatory Visit: Payer: Self-pay | Admitting: Internal Medicine

## 2022-01-02 ENCOUNTER — Ambulatory Visit (HOSPITAL_COMMUNITY)
Admission: RE | Admit: 2022-01-02 | Discharge: 2022-01-02 | Disposition: A | Discharge status: Home / Self Care | Payer: Medicare Other | Source: Ambulatory Visit | Attending: Internal Medicine | Admitting: Internal Medicine

## 2022-01-02 ENCOUNTER — Other Ambulatory Visit (HOSPITAL_COMMUNITY): Payer: Self-pay | Admitting: Interventional Radiology

## 2022-01-02 ENCOUNTER — Encounter (HOSPITAL_COMMUNITY): Payer: Self-pay

## 2022-01-02 DIAGNOSIS — Z95828 Presence of other vascular implants and grafts: Secondary | ICD-10-CM

## 2022-01-02 DIAGNOSIS — T82898A Other specified complication of vascular prosthetic devices, implants and grafts, initial encounter: Secondary | ICD-10-CM | POA: Diagnosis not present

## 2022-01-02 DIAGNOSIS — Y832 Surgical operation with anastomosis, bypass or graft as the cause of abnormal reaction of the patient, or of later complication, without mention of misadventure at the time of the procedure: Secondary | ICD-10-CM | POA: Diagnosis not present

## 2022-01-02 DIAGNOSIS — F17210 Nicotine dependence, cigarettes, uncomplicated: Secondary | ICD-10-CM | POA: Diagnosis not present

## 2022-01-02 DIAGNOSIS — J90 Pleural effusion, not elsewhere classified: Secondary | ICD-10-CM

## 2022-01-02 DIAGNOSIS — T82858A Stenosis of vascular prosthetic devices, implants and grafts, initial encounter: Secondary | ICD-10-CM | POA: Diagnosis not present

## 2022-01-02 DIAGNOSIS — K7031 Alcoholic cirrhosis of liver with ascites: Secondary | ICD-10-CM | POA: Diagnosis not present

## 2022-01-02 DIAGNOSIS — R188 Other ascites: Secondary | ICD-10-CM | POA: Diagnosis not present

## 2022-01-02 DIAGNOSIS — I864 Gastric varices: Secondary | ICD-10-CM | POA: Diagnosis not present

## 2022-01-02 HISTORY — PX: IR TRANSCATH RETRIEVAL FB INCL GUIDANCE (MS): IMG5375

## 2022-01-02 HISTORY — PX: IR EMBO ART  VEN HEMORR LYMPH EXTRAV  INC GUIDE ROADMAPPING: IMG5450

## 2022-01-02 HISTORY — PX: IR PARACENTESIS: IMG2679

## 2022-01-02 HISTORY — PX: IR US GUIDE VASC ACCESS RIGHT: IMG2390

## 2022-01-02 HISTORY — PX: IR THROMBECT VENO MECH MOD SED: IMG2300

## 2022-01-02 HISTORY — PX: IR TIPS REVISION MOD SED: IMG2296

## 2022-01-02 LAB — CBC WITH DIFFERENTIAL/PLATELET
Abs Immature Granulocytes: 0 10*3/uL (ref 0.00–0.07)
Basophils Absolute: 0 10*3/uL (ref 0.0–0.1)
Basophils Relative: 1 %
Eosinophils Absolute: 0 10*3/uL (ref 0.0–0.5)
Eosinophils Relative: 0 %
HCT: 34.7 % — ABNORMAL LOW (ref 36.0–46.0)
Hemoglobin: 10.5 g/dL — ABNORMAL LOW (ref 12.0–15.0)
Lymphocytes Relative: 9 %
Lymphs Abs: 0.4 10*3/uL — ABNORMAL LOW (ref 0.7–4.0)
MCH: 22.2 pg — ABNORMAL LOW (ref 26.0–34.0)
MCHC: 30.3 g/dL (ref 30.0–36.0)
MCV: 73.5 fL — ABNORMAL LOW (ref 80.0–100.0)
Monocytes Absolute: 0 10*3/uL — ABNORMAL LOW (ref 0.1–1.0)
Monocytes Relative: 0 %
Neutro Abs: 3.6 10*3/uL (ref 1.7–7.7)
Neutrophils Relative %: 90 %
Platelets: 197 10*3/uL (ref 150–400)
RBC: 4.72 MIL/uL (ref 3.87–5.11)
RDW: 23.8 % — ABNORMAL HIGH (ref 11.5–15.5)
WBC: 4 10*3/uL (ref 4.0–10.5)
nRBC: 0 % (ref 0.0–0.2)
nRBC: 0 /100 WBC

## 2022-01-02 LAB — COMPREHENSIVE METABOLIC PANEL
ALT: 22 U/L (ref 0–44)
AST: 36 U/L (ref 15–41)
Albumin: 2.8 g/dL — ABNORMAL LOW (ref 3.5–5.0)
Alkaline Phosphatase: 132 U/L — ABNORMAL HIGH (ref 38–126)
Anion gap: 11 (ref 5–15)
BUN: 11 mg/dL (ref 6–20)
CO2: 22 mmol/L (ref 22–32)
Calcium: 8.6 mg/dL — ABNORMAL LOW (ref 8.9–10.3)
Chloride: 104 mmol/L (ref 98–111)
Creatinine, Ser: 0.85 mg/dL (ref 0.44–1.00)
GFR, Estimated: >60 mL/min (ref >60)
Glucose, Bld: 181 mg/dL — ABNORMAL HIGH (ref 70–99)
Potassium: 3.7 mmol/L (ref 3.5–5.1)
Sodium: 137 mmol/L (ref 135–145)
Total Bilirubin: 1.4 mg/dL — ABNORMAL HIGH (ref 0.3–1.2)
Total Protein: 6.7 g/dL (ref 6.5–8.1)

## 2022-01-02 LAB — PROTIME-INR
INR: 1.3 — ABNORMAL HIGH (ref 0.8–1.2)
Prothrombin Time: 15.6 seconds — ABNORMAL HIGH (ref 11.4–15.2)

## 2022-01-02 MED ORDER — DIPHENHYDRAMINE HCL 50 MG/ML IJ SOLN
INTRAMUSCULAR | Status: AC
  Filled 2022-01-02: qty 1

## 2022-01-02 MED ORDER — PREDNISONE 50 MG PO TABS
50.0000 mg | ORAL_TABLET | Freq: Every day | ORAL | Status: DC
  Administered 2022-01-02: 50 mg via ORAL
  Filled 2022-01-02: qty 1

## 2022-01-02 MED ORDER — SODIUM CHLORIDE 0.9 % IV SOLN: INTRAVENOUS | Status: DC

## 2022-01-02 MED ORDER — FENTANYL CITRATE (PF) 100 MCG/2ML IJ SOLN
INTRAMUSCULAR | Status: AC
  Filled 2022-01-02: qty 2

## 2022-01-02 MED ORDER — IOHEXOL 300 MG/ML  SOLN: 100.0000 mL | Freq: Once | INTRAMUSCULAR | Status: DC | PRN

## 2022-01-02 MED ORDER — MIDAZOLAM HCL 2 MG/2ML IJ SOLN
INTRAMUSCULAR | Status: AC
  Filled 2022-01-02: qty 2

## 2022-01-02 MED ORDER — OXYCODONE HCL 5 MG PO TABS
10.0000 mg | ORAL_TABLET | ORAL | Status: DC | PRN
  Administered 2022-01-02: 10 mg via ORAL
  Filled 2022-01-02: qty 2

## 2022-01-02 MED ORDER — ALBUMIN HUMAN 25 % IV SOLN
75.0000 g | Freq: Once | INTRAVENOUS | Status: AC
  Administered 2022-01-02: 75 g via INTRAVENOUS
  Filled 2022-01-02: qty 300

## 2022-01-02 MED ORDER — DIPHENHYDRAMINE HCL 25 MG PO CAPS
50.0000 mg | ORAL_CAPSULE | ORAL | Status: AC
  Administered 2022-01-02: 50 mg via ORAL
  Filled 2022-01-02: qty 1

## 2022-01-02 MED ORDER — LIDOCAINE HCL 1 % IJ SOLN
INTRAMUSCULAR | Status: AC
  Filled 2022-01-02: qty 20

## 2022-01-02 MED ORDER — SODIUM CHLORIDE 0.9 % IV SOLN
INTRAVENOUS | Status: AC
  Filled 2022-01-02: qty 20

## 2022-01-02 MED ORDER — CEFTRIAXONE SODIUM 2 G IV SOLR
INTRAVENOUS | Status: AC | PRN
  Administered 2022-01-02: 2 g via INTRAVENOUS

## 2022-01-02 MED ORDER — MIDAZOLAM HCL 2 MG/2ML IJ SOLN
INTRAMUSCULAR | Status: AC | PRN
  Administered 2022-01-02: .5 mg via INTRAVENOUS
  Administered 2022-01-02: 1 mg via INTRAVENOUS
  Administered 2022-01-02 (×5): .5 mg via INTRAVENOUS

## 2022-01-02 MED ORDER — FENTANYL CITRATE (PF) 100 MCG/2ML IJ SOLN
INTRAMUSCULAR | Status: AC | PRN
  Administered 2022-01-02 (×3): 25 ug via INTRAVENOUS
  Administered 2022-01-02: 50 ug via INTRAVENOUS
  Administered 2022-01-02 (×3): 25 ug via INTRAVENOUS

## 2022-01-02 NOTE — H&P (Signed)
Chief Complaint: Patient was seen in consultation today for recurrent large volume ascites at the request of [place] [facility]  Referring Physician(s): [place] C  Supervising Physician: Jacqulynn Cadet  Patient Status: [facility] - Out-pt  History of Present Illness: Katie Holland is a 56 y.o. female with PMH of EtOH cirrhosis complicated by recurrent large volume ascites. Pt underwent TIPS creation 11/02/21 with Dr. Laurence Ferrari. At her follow up appointment, she reported that she first noticed slower reaccumulation of fluid after procedure, then started to feel like fluid was starting to reaccumulate faster by her follow up 12/27/21. Pt was advised by Dr. Laurence Ferrari at that time that she would need revision due to large portosystemic collateral competing with TIPS leading to thrombosis. Pt presents today for TIPS revision.   Past Medical History:  Diagnosis Date   Alcoholic liver disease (HCC)    Chronic back pain    on disability   Cirrhosis of liver (HCC)    Esophageal varices (HCC)    GERD (gastroesophageal reflux disease)    Headache    HEPATIC Hydrothorax    S/P tips   Jaundice     Past Surgical History:  Procedure Laterality Date   BACK SURGERY     L3-L4   BIOPSY  08/07/2020   Procedure: BIOPSY;  Surgeon: Yetta Flock, MD;  Location: [facility] ENDOSCOPY;  Service: Gastroenterology;;   COLONOSCOPY WITH PROPOFOL N/A 08/01/2019   Procedure: COLONOSCOPY WITH PROPOFOL;  Surgeon: Yetta Flock, MD;  Location: [facility];  Service: Gastroenterology;  Laterality: N/A;   ESOPHAGEAL BANDING  04/25/2018   Procedure: ESOPHAGEAL BANDING;  Surgeon: Yetta Flock, MD;  Location: [facility] ENDOSCOPY;  Service: Gastroenterology;;   ESOPHAGEAL BANDING  04/27/2018   Procedure: ESOPHAGEAL BANDING;  Surgeon: Milus Banister, MD;  Location: [place];  Service: Endoscopy;;   ESOPHAGEAL BANDING  06/02/2018   Procedure: ESOPHAGEAL BANDING;  Surgeon: Doran Stabler, MD;  Location: [place];  Service: Gastroenterology;;   ESOPHAGEAL BANDING N/A 07/07/2018   Procedure: ESOPHAGEAL BANDING;  Surgeon: Yetta Flock, MD;  Location: [facility];  Service: Gastroenterology;  Laterality: N/A;   ESOPHAGEAL BANDING  07/27/2018   Procedure: ESOPHAGEAL BANDING;  Surgeon: Yetta Flock, MD;  Location: [facility];  Service: Gastroenterology;;   ESOPHAGEAL BANDING  08/30/2018   Procedure: ESOPHAGEAL BANDING;  Surgeon: Yetta Flock, MD;  Location: [facility];  Service: Gastroenterology;;   ESOPHAGOGASTRODUODENOSCOPY N/A 04/27/2018   Procedure: ESOPHAGOGASTRODUODENOSCOPY (EGD);  Surgeon: Milus Banister, MD;  Location: [facility] ENDOSCOPY;  Service: Endoscopy;  Laterality: N/A;   ESOPHAGOGASTRODUODENOSCOPY (EGD) WITH PROPOFOL N/A 04/25/2018   Procedure: ESOPHAGOGASTRODUODENOSCOPY (EGD) WITH PROPOFOL;  Surgeon: Yetta Flock, MD;  Location: [place];  Service: Gastroenterology;  Laterality: N/A;   ESOPHAGOGASTRODUODENOSCOPY (EGD) WITH PROPOFOL N/A 06/02/2018   Procedure: ESOPHAGOGASTRODUODENOSCOPY (EGD) WITH PROPOFOL;  Surgeon: Doran Stabler, MD;  Location: [place];  Service: Gastroenterology;  Laterality: N/A;   ESOPHAGOGASTRODUODENOSCOPY (EGD) WITH PROPOFOL N/A 07/07/2018   Procedure: ESOPHAGOGASTRODUODENOSCOPY (EGD) WITH PROPOFOL;  Surgeon: Yetta Flock, MD;  Location: [facility];  Service: Gastroenterology;  Laterality: N/A;   ESOPHAGOGASTRODUODENOSCOPY (EGD) WITH PROPOFOL N/A 07/27/2018   Procedure: ESOPHAGOGASTRODUODENOSCOPY (EGD) WITH PROPOFOL;  Surgeon: Yetta Flock, MD;  Location: [facility];  Service: Gastroenterology;  Laterality: N/A;   ESOPHAGOGASTRODUODENOSCOPY (EGD) WITH PROPOFOL N/A 08/30/2018   Procedure: ESOPHAGOGASTRODUODENOSCOPY (EGD) WITH PROPOFOL;  Surgeon: Yetta Flock, MD;  [facility];  Service: Gastroenterology;  Laterality: N/A;   ESOPHAGOGASTRODUODENOSCOPY (EGD) WITH PROPOFOL  N/A 11/08/2018    Procedure: ESOPHAGOGASTRODUODENOSCOPY (EGD) WITH PROPOFOL;  Surgeon: Yetta Flock, MD;  Location: [facility];  Service: Gastroenterology;  Laterality: N/A;   ESOPHAGOGASTRODUODENOSCOPY (EGD) WITH PROPOFOL N/A 01/31/2019   Procedure: ESOPHAGOGASTRODUODENOSCOPY (EGD) WITH PROPOFOL;  Surgeon: Yetta Flock, MD;  [facility]: [facility];  Service: Gastroenterology;  Laterality: N/A;   ESOPHAGOGASTRODUODENOSCOPY (EGD) WITH PROPOFOL N/A 08/01/2019   Procedure: ESOPHAGOGASTRODUODENOSCOPY (EGD) WITH PROPOFOL;  Surgeon: Yetta Flock, MD;  Location: [facility];  Service: Gastroenterology;  Laterality: N/A;   ESOPHAGOGASTRODUODENOSCOPY (EGD) WITH PROPOFOL N/A 08/07/2020   Procedure: ESOPHAGOGASTRODUODENOSCOPY (EGD) WITH PROPOFOL;  Surgeon: Yetta Flock, MD;  Location: [facility];  Service: Gastroenterology;  Laterality: N/A;   ESOPHAGOGASTRODUODENOSCOPY (EGD) WITH PROPOFOL N/A 08/22/2021   Procedure: ESOPHAGOGASTRODUODENOSCOPY (EGD) WITH PROPOFOL;  Surgeon: Yetta Flock, MD;  Location: [facility];  Service: Gastroenterology;  Laterality: N/A;   IR INTRAVASCULAR ULTRASOUND NON CORONARY  11/05/2021   IR PARACENTESIS  04/26/2018   IR PARACENTESIS  05/18/2018   IR PARACENTESIS  05/31/2018   IR PARACENTESIS  06/30/2018   IR PARACENTESIS  08/05/2018   IR PARACENTESIS  08/31/2018   IR PARACENTESIS  10/08/2018   IR PARACENTESIS  10/28/2018   IR PARACENTESIS  02/14/2019   IR PARACENTESIS  03/09/2019   IR PARACENTESIS  04/05/2019   IR PARACENTESIS  05/03/2019   IR PARACENTESIS  06/20/2019   IR PARACENTESIS  07/19/2019   IR PARACENTESIS  08/19/2019   IR PARACENTESIS  09/27/2019   IR PARACENTESIS  10/07/2019   IR PARACENTESIS  10/28/2019   IR PARACENTESIS  11/16/2019   IR PARACENTESIS  12/30/2019   IR PARACENTESIS  01/16/2020   IR PARACENTESIS  02/07/2020   IR PARACENTESIS  03/06/2020   IR PARACENTESIS  04/04/2020   IR PARACENTESIS  04/17/2020   IR PARACENTESIS  05/04/2020   IR  PARACENTESIS  06/07/2020   IR PARACENTESIS  06/28/2020   IR PARACENTESIS  07/27/2020   IR PARACENTESIS  08/20/2020   IR PARACENTESIS  09/13/2020   IR PARACENTESIS  10/04/2020   IR PARACENTESIS  10/25/2020   IR PARACENTESIS  11/07/2020   IR PARACENTESIS  11/30/2020   IR PARACENTESIS  12/13/2020   IR PARACENTESIS  01/04/2021   IR PARACENTESIS  01/25/2021   IR PARACENTESIS  02/20/2021   IR PARACENTESIS  03/20/2021   IR PARACENTESIS  04/18/2021   IR PARACENTESIS  05/14/2021   IR PARACENTESIS  06/10/2021   IR PARACENTESIS  06/27/2021   IR PARACENTESIS  07/17/2021   IR PARACENTESIS  08/12/2021   IR PARACENTESIS  08/23/2021   IR PARACENTESIS  09/13/2021   IR PARACENTESIS  10/02/2021   IR PARACENTESIS  10/22/2021   IR PARACENTESIS  11/01/2021   IR PARACENTESIS  11/29/2021   IR PARACENTESIS  12/18/2021   IR RADIOLOGIST EVAL & MGMT  12/23/2018   IR RADIOLOGIST EVAL & MGMT  10/11/2021   IR RADIOLOGIST EVAL & MGMT  12/27/2021   IR THORACENTESIS ASP PLEURAL SPACE W/IMG GUIDE  12/10/2018   IR THORACENTESIS ASP PLEURAL SPACE W/IMG GUIDE  01/21/2019   IR THORACENTESIS ASP PLEURAL SPACE W/IMG GUIDE  10/23/2021   IR THORACENTESIS ASP PLEURAL SPACE W/IMG GUIDE  10/25/2021   IR THORACENTESIS ASP PLEURAL SPACE W/IMG GUIDE  11/01/2021   IR TIPS  11/01/2021   IR US GUIDE VASC ACCESS RIGHT  11/01/2021   IR US GUIDE VASC ACCESS RIGHT  11/01/2021   IR US GUIDE VASC ACCESS RIGHT  11/01/2021   RADIOLOGY WITH  ANESTHESIA N/A 11/01/2021   Procedure: TIPS;  Surgeon: Criselda Peaches, MD;  Location: [place];  Service: Radiology;  Laterality: N/A;    Allergies: Contrast media [iodinated contrast media], Latex, and Sulfa antibiotics  Medications: Prior to Admission medications   Medication Sig Start Date End Date Taking? Authorizing Provider  diphenhydrAMINE (BENADRYL) 50 MG tablet Take 1 tablet (50 mg total) by mouth once for 1 dose. Take 1 tablet at 0830 the morning of procedure. Please bring this medication to appointment. 01/02/22 01/02/22   Tyson Alias, NP  predniSONE (DELTASONE) 50 MG tablet Take 1 tablet at 8:30 pm the night before procedure. Take 1 tablet 2:30 am the morning before procedure. Take 1 tablet 8:30 am the morning of procedure. Bring last dose with you to appointment. 01/01/22   Tyson Alias, NP     Family History  Problem Relation Age of Onset   Colon cancer Neg Hx    Esophageal cancer Neg Hx    Pancreatic cancer Neg Hx    Stomach cancer Neg Hx     Social History   Socioeconomic History   Marital status: Single    Spouse name: Not on file   Number of children: 8   Years of education: Not on file   Highest education level: Not on file  Occupational History   Occupation: disability  Tobacco Use   Smoking status: Some Days    Packs/day: 0.25    Years: 20.00    Total pack years: 5.00    Types: Cigarettes   Smokeless tobacco: Never   Tobacco comments:    5 a day  Vaping Use   Vaping Use: Former  Substance and Sexual Activity   Alcohol use: Not Currently   Drug use: Not Currently   Sexual activity: Not Currently  Other Topics Concern   Not on file  Social History Narrative   Not on file   Social Determinants of Health   Financial Resource Strain: High Risk (11/22/2021)   Overall Financial Resource Strain (CARDIA)    Difficulty of Paying Living Expenses: Hard  Food Insecurity: No Food Insecurity (11/19/2021)   Hunger Vital Sign    Worried About Running Out of Food in the Last Year: Never true    Ran Out of Food in the Last Year: Never true  Transportation Needs: No Transportation Needs (11/19/2021)   PRAPARE - Hydrologist (Medical): No    Lack of Transportation (Non-Medical): No  Physical Activity: Insufficiently Active (11/19/2021)   Exercise Vital Sign    Days of Exercise per Week: 2 days    Minutes of Exercise per Session: 20 min  Stress: No Stress Concern Present (12/18/2020)   [place]     Feeling of Stress : Not at all  Social Connections: Socially Isolated (11/19/2021)   Social Connection and Isolation Panel [NHANES]    Frequency of Communication with Friends and Family: Three times a week    Frequency of Social Gatherings with Friends and Family: Twice a week    Attends Religious Services: Never    Marine scientist or Organizations: No    Attends Music therapist: Never    Marital Status: Divorced    Review of Systems: A 12 point ROS discussed and pertinent positives are indicated in the HPI above.  All other systems are negative.  Review of Systems  Constitutional:  Negative for chills and fever.  Respiratory:  Negative for shortness of breath.   Cardiovascular:  Negative for chest pain.  Gastrointestinal:  Positive for abdominal pain.    Vital Signs: BP 116/82   Pulse 93   Temp 97.9 F (36.6 C) (Oral)   Resp 15   Ht '5\' 4"'$  (1.626 m)   Wt 140 lb (63.5 kg)   SpO2 96%   BMI 24.03 kg/m     Physical Exam Vitals reviewed.  Constitutional:      General: She is not in acute distress.    Appearance: Normal appearance.  Eyes:     Extraocular Movements: Extraocular movements intact.     Pupils: Pupils are equal, round, and reactive to light.  Cardiovascular:     Rate and Rhythm: Normal rate and regular rhythm.     Pulses: Normal pulses.  Pulmonary:     Effort: Pulmonary effort is normal. No respiratory distress.  Abdominal:     General: There is distension.  Skin:    General: Skin is warm and dry.  Neurological:     General: No focal deficit present.     Mental Status: She is alert and oriented to person, place, and time. Mental status is at baseline.  Psychiatric:        Mood and Affect: Mood normal.        Thought Content: Thought content normal.        Judgment: Judgment normal.     Imaging: IR Radiologist Eval & Mgmt  Result Date: 12/27/2021 EXAM: NEW PATIENT OFFICE VISIT CHIEF COMPLAINT: SEE EPIC NOTE HISTORY OF PRESENT  ILLNESS: SEE EPIC NOTE REVIEW OF SYSTEMS: SEE EPIC NOTE PHYSICAL EXAMINATION: SEE EPIC NOTE ASSESSMENT AND PLAN: SEE EPIC NOTE Electronically Signed   By: Jacqulynn Cadet M.D.   On: 12/27/2021 10:58   US ABDOMINAL PELVIC ART/VENT FLOW DOPPLER  Result Date: 12/27/2021 CLINICAL DATA:  56 year old female with alcoholic cirrhosis complicated by recurrent large volume ascites and hepatic hydrothorax. She underwent TIPS creation on 11/01/2021. EXAM: DUPLEX ULTRASOUND OF LIVER AND TIPS SHUNT TECHNIQUE: Color and duplex Doppler ultrasound was performed to evaluate the hepatic in-flow and out-flow vessels. COMPARISON:  None Available. FINDINGS: Portal Vein Velocities Main:  37 cm/sec TIPS Stent Velocities Proximal:  0 cm/sec Mid:  0 Distal:  0 cm/sec IVC: Present and patent with normal respiratory phasicity. Hepatic Vein Velocities Right:   cm/sec Mid:  44 cm/sec Left:  31 cm/sec Splenic Vein: Not well seen Superior Mesenteric Vein: Not well seen Ascites: Large volume ascites. Varices: Not visualized. Other findings: Cirrhotic liver morphology. IMPRESSION: 1. No flow visualized within the recently placed TIPS stentgraft concerning for occlusion. 2. Large volume ascites. Electronically Signed   By: Jacqulynn Cadet M.D.   On: 12/27/2021 10:50   IR Paracentesis  Result Date: 12/19/2021 INDICATION: Patient with history of cirrhosis, status post tips procedure 11/02/2021 by Dr. Laurence Ferrari. Presents with recurrent ascites for diagnostic and therapeutic paracentesis. EXAM: ULTRASOUND GUIDED DIAGNOSTIC AND THERAPEUTIC PARACENTESIS MEDICATIONS: 10 mL 1% lidocaine COMPLICATIONS: None immediate. PROCEDURE: Informed written consent was obtained from the patient after a discussion of the risks, benefits and alternatives to treatment. A timeout was performed prior to the initiation of the procedure. Initial ultrasound scanning demonstrates a large amount of ascites within the right lower abdominal quadrant. The right lower  abdomen was prepped and draped in the usual sterile fashion. 1% lidocaine was used for local anesthesia. Following this, a 19 gauge, 7-cm, Yueh catheter was introduced. An ultrasound image was saved for documentation purposes. The  paracentesis was performed. The catheter was removed and a dressing was applied. The patient tolerated the procedure well without immediate post procedural complication. FINDINGS: A total of approximately 5.0 liters of clear, yellow fluid was removed. Samples were sent to the laboratory as requested by the clinical team. IMPRESSION: Successful ultrasound-guided paracentesis yielding 5.0 liters of peritoneal fluid. Read by: Brynda Greathouse PA-C Electronically Signed   By: Lucrezia Europe M.D.   On: 12/19/2021 10:12    Labs:  CBC: Recent Labs    11/02/21 0152 11/06/21 0050 12/12/21 1623 01/02/22 0805  WBC 11.4* 5.4 5.6 4.0  HGB 10.0* 8.4* 9.8* 10.5*  HCT 30.8* 25.0* 31.4* 34.7*  PLT 141* 102* 203.0 197    COAGS: Recent Labs    11/01/21 0538 11/02/21 0152 12/12/21 1623 01/02/22 0805  INR 1.1 1.5* 1.3* 1.3*    BMP: Recent Labs    11/03/21 0101 11/05/21 0840 11/06/21 0050 11/15/21 1507 11/25/21 1428 12/12/21 1623 01/02/22 0805  NA 133* 130* 130* 131* 137 138 137  K 4.8 4.3 4.5 4.4 4.0 4.2 3.7  CL 100 97* 97* 97 107 106 104  CO2 '27 27 27 25 24 26 22  '$ GLUCOSE 122* 131* 109* 93 98 96 181*  BUN 24* 13 9 44* '15 13 11  '$ CALCIUM 8.9 8.4* 8.5* 9.1 8.6 8.8 8.6*  CREATININE 0.81 0.71 0.58 1.27* 0.73 0.70 0.85  GFRNONAA >60 >60 >60  --   --   --  >60    LIVER FUNCTION TESTS: Recent Labs    11/01/21 0538 11/02/21 0152 12/12/21 1623 01/02/22 0805  BILITOT 1.2 0.9 1.2 1.4*  AST 37 59* 31 36  ALT 22 34 20 22  ALKPHOS 88 87 128* 132*  PROT 6.0* 6.0* 5.8* 6.7  ALBUMIN 2.6* 3.0* 2.8* 2.8*    TUMOR MARKERS: Recent Labs    08/05/21 0935  AFPTM 1.4    Assessment and Plan: History of EtOH cirrhosis complicated by recurrent large volume ascites. Pt  underwent TIPS creation 11/02/21 with Dr. Laurence Ferrari. At her follow up appointment, she reported that she first noticed slower reaccumulation of fluid after procedure, then started to feel like fluid was starting to reaccumulate faster by her follow up 12/27/21. Pt was advised by Dr. Laurence Ferrari at that time that she would need revision due to large portosystemic collateral competing with TIPS leading to thrombosis. Pt presents today for TIPS revision.   Pt resting on stretcher.  She is A&O, calm and pleasant.  Pt states that she took all of her contrast allergy pre-medication yesterday.  Prednisone and benadryl ordered for procedure today.   Risks and benefits of TIPS revision with moderate sedation were discussed with the patient and/or the patient's family including, but not limited to, infection, bleeding, damage to adjacent structures, worsening hepatic and/or cardiac function, worsening and/or the development of altered mental status/encephalopathy, non-target embolization and death.   This interventional procedure involves the use of X-rays and because of the nature of the planned procedure, it is possible that we will have prolonged use of X-ray fluoroscopy.  Potential radiation risks to you include (but are not limited to) the following: - A slightly elevated risk for cancer  several years later in life. This risk is typically less than 0.5% percent. This risk is low in comparison to the normal incidence of human cancer, which is 33% for women and 50% for men according to the [employer]. - Radiation induced injury can include skin redness, resembling a rash, tissue  breakdown / ulcers and hair loss (which can be temporary or permanent).   The likelihood of either of these occurring depends on the difficulty of the procedure and whether you are sensitive to radiation due to previous procedures, disease, or genetic conditions.   IF your procedure requires a prolonged use of  radiation, you will be notified and given written instructions for further action.  It is your responsibility to monitor the irradiated area for the 2 weeks following the procedure and to notify your physician if you are concerned that you have suffered a radiation induced injury.    All of the patient's questions were answered, patient is agreeable to proceed.  Consent signed and in chart.  Thank you for this interesting consult.  I greatly enjoyed meeting Katie Holland and look forward to participating in their care.  A copy of this report was sent to the requesting provider on this date.  Electronically Signed: Tyson Alias, NP 01/02/2022, 9:18 AM   I spent a total of 20 minutes in face to face in clinical consultation, greater than 50% of which was counseling/coordinating care for TIPS revision, large volume recurrent ascites.

## 2022-01-02 NOTE — Progress Notes (Signed)
Per [place], [place] ok to d/c home at 1700 and advise client to return to radiology tomorrow at 1030 for thoracentesis

## 2022-01-02 NOTE — Procedures (Signed)
Interventional Radiology Procedure Note  Procedure: TIPS revision, embolization of portosystemic shunt  Complications: None  Estimated Blood Loss: NOne  Recommendations: - Bedrest x 4 hrs - Probable DC home   Signed,  Criselda Peaches, MD

## 2022-01-02 NOTE — Progress Notes (Signed)
Per Dr. Laurence Ferrari, patient stable for discharge home this afternoon. She is scheduled for thoracentesis tomorrow at 11am, to arrive at 1030am.  Brynda Greathouse, MS RD PA-C 4:47 PM

## 2022-01-03 ENCOUNTER — Ambulatory Visit (HOSPITAL_COMMUNITY)
Admission: RE | Admit: 2022-01-03 | Discharge: 2022-01-03 | Disposition: A | Discharge status: Home / Self Care | Payer: Medicare Other | Source: Ambulatory Visit | Attending: Student | Admitting: Student

## 2022-01-03 ENCOUNTER — Ambulatory Visit (HOSPITAL_COMMUNITY)
Admission: RE | Admit: 2022-01-03 | Discharge: 2022-01-03 | Disposition: A | Discharge status: Home / Self Care | Payer: Medicare Other | Source: Ambulatory Visit | Attending: Interventional Radiology | Admitting: Interventional Radiology

## 2022-01-03 ENCOUNTER — Other Ambulatory Visit (HOSPITAL_COMMUNITY): Payer: Self-pay | Admitting: Student

## 2022-01-03 DIAGNOSIS — J984 Other disorders of lung: Secondary | ICD-10-CM | POA: Diagnosis not present

## 2022-01-03 DIAGNOSIS — J90 Pleural effusion, not elsewhere classified: Secondary | ICD-10-CM

## 2022-01-03 HISTORY — PX: IR THORACENTESIS ASP PLEURAL SPACE W/IMG GUIDE: IMG5380

## 2022-01-03 MED ORDER — LIDOCAINE HCL 1 % IJ SOLN
INTRAMUSCULAR | Status: AC
  Administered 2022-01-03: 10 mL
  Filled 2022-01-03: qty 20

## 2022-01-03 MED FILL — Ceftriaxone Sodium For Inj 2 GM: INTRAMUSCULAR | Qty: 2 | Status: AC

## 2022-01-03 NOTE — Procedures (Signed)
PROCEDURE SUMMARY:  Successful US guided right thoracentesis. Yielded 2 L of clear yellow fluid. Pt tolerated procedure well. No immediate complications.  Specimen not sent for labs. CXR ordered.  EBL < 2 mL  Theresa Duty, NP 01/03/2022 12:54 PM

## 2022-01-03 NOTE — Progress Notes (Signed)
Patient evaluated in IR today when she presented for a right thoracentesis. She is s/p TIPS revision with embolization of portosystemic shunt 01/02/22.    She is feeling well today, just a little tired. Minimal pain, no nausea. She tolerated thoracentesis procedure well with 2 L removed and stated afterwards she felt much better. She does not appear to be in any discomfort or distress.   I will plan to call Katie Holland over the weekend to check in on her.   [name], [place] [phone] 01/03/2022, 2:25 PM

## 2022-01-06 ENCOUNTER — Telehealth: Payer: Self-pay | Admitting: Family Medicine

## 2022-01-06 NOTE — Telephone Encounter (Signed)
Left message for patient to call back and schedule Medicare Annual Wellness Visit (AWV) either virtually or in office. Left  my Herbie Drape number [phone]   Last AWV ;12/18/20  please schedule at anytime with [facility] Nurse Health Advisor 1 or 2

## 2022-01-09 ENCOUNTER — Telehealth: Payer: Self-pay | Admitting: Family Medicine

## 2022-01-09 ENCOUNTER — Telehealth (HOSPITAL_COMMUNITY): Payer: Self-pay | Admitting: Student

## 2022-01-09 NOTE — Telephone Encounter (Signed)
Daughter returning call from care team regarding results

## 2022-01-09 NOTE — Telephone Encounter (Signed)
Post-TIPS revision telephone follow up. Voice message left on her cell phone.   [name], [place] [phone] 01/09/2022, 3:24 PM

## 2022-01-09 NOTE — Telephone Encounter (Signed)
Spoke to patient's daughter. See lab note.

## 2022-01-13 ENCOUNTER — Encounter: Payer: Self-pay | Admitting: Gastroenterology

## 2022-01-16 ENCOUNTER — Other Ambulatory Visit: Payer: Self-pay | Admitting: Interventional Radiology

## 2022-01-16 ENCOUNTER — Telehealth (HOSPITAL_COMMUNITY): Payer: Self-pay | Admitting: Student

## 2022-01-16 DIAGNOSIS — R188 Other ascites: Secondary | ICD-10-CM

## 2022-01-16 DIAGNOSIS — J90 Pleural effusion, not elsewhere classified: Secondary | ICD-10-CM

## 2022-01-16 DIAGNOSIS — K7031 Alcoholic cirrhosis of liver with ascites: Secondary | ICD-10-CM

## 2022-01-16 NOTE — Telephone Encounter (Signed)
   [facility] TIPS post-procedure phone call follow-up   Katie Holland is a 56 y.o. female who underwent TIPS creation at [facility] [place] 11/01/21 with Dr. Vernard Gambles. She required TIPS revision and this was done 01/02/22 by Dr. Vernard Gambles.   Patient is 2 weeks/months from procedure.   # of paracentesis in last month: 2 including one during TIPS. She has also had one thoracentesis.  # of paracentesis in last 2 months: 3  Current diuretic regimen: Lasix 40 mg daily, Spironolactone 100 mg daily Current pharmacologic encephalopathy prophylaxis/treatment: Lactulose 20 mg BID  Post-TIPS revision Imaging: none   Labs: 01/02/22 Creatinine: 0.85 Total Bilirubin: 1.4 INR: 1.3 Sodium: 137 Albumin: 2.8 Ammonia: 53 12/18/21  Assessment: [name] is a 56 y.o. female with history of cirrhosis. Patient is 2 weeks from TIPS revision and is feeling really well. She denies any pain or discomfort. She is eating and drinking well and taking all medications as directed. She endorses some mild abdominal fullness and also states she is feeling a little more short of breath.   Recommendation:  Patient is symptomatic and needs labs and or clinic follow up sooner than anticipated. A scheduler will reach out to the patient to arrange date/time for a thoracentesis. An order as been placed.   Electronically Signed: Theresa Duty, NP 01/16/2022, 2:27 PM

## 2022-01-21 ENCOUNTER — Ambulatory Visit: Payer: Medicare HMO | Admitting: Gastroenterology

## 2022-01-22 ENCOUNTER — Ambulatory Visit: Payer: Medicare HMO | Admitting: Gastroenterology

## 2022-01-22 ENCOUNTER — Telehealth: Payer: Self-pay

## 2022-01-22 NOTE — Telephone Encounter (Signed)
Called and spoke to patient who no-showed her appointment today. Let her know that if she does not keep her appointments we will be forced to discharge her.  She expressed understanding.  Patient scheduled for Tuesday, 8-29 at 3:40 pm. Told to arrive at 3:30pm. She said transportation is her problem so I encouraged her to let her husband and her daughter know NOW about this appointment so one of them can make arrangements to bring her. She said she would.

## 2022-01-28 ENCOUNTER — Other Ambulatory Visit (HOSPITAL_COMMUNITY): Payer: Medicare Other

## 2022-01-29 ENCOUNTER — Inpatient Hospital Stay: Admission: RE | Admit: 2022-01-29 | Payer: Medicare Other | Source: Ambulatory Visit

## 2022-02-04 ENCOUNTER — Ambulatory Visit: Payer: Medicare Other

## 2022-02-04 ENCOUNTER — Ambulatory Visit
Admission: RE | Admit: 2022-02-04 | Discharge: 2022-02-04 | Disposition: A | Discharge status: Home / Self Care | Payer: Medicare Other | Source: Ambulatory Visit | Attending: Student | Admitting: Student

## 2022-02-04 ENCOUNTER — Other Ambulatory Visit: Payer: Medicare Other

## 2022-02-04 DIAGNOSIS — K7031 Alcoholic cirrhosis of liver with ascites: Secondary | ICD-10-CM

## 2022-02-04 DIAGNOSIS — R188 Other ascites: Secondary | ICD-10-CM

## 2022-02-18 ENCOUNTER — Ambulatory Visit: Payer: Medicare Other | Admitting: Gastroenterology

## 2022-03-05 ENCOUNTER — Telehealth: Payer: Self-pay | Admitting: Family Medicine

## 2022-03-05 DIAGNOSIS — D72829 Elevated white blood cell count, unspecified: Secondary | ICD-10-CM | POA: Diagnosis not present

## 2022-03-05 DIAGNOSIS — R2681 Unsteadiness on feet: Secondary | ICD-10-CM | POA: Diagnosis not present

## 2022-03-05 DIAGNOSIS — F17210 Nicotine dependence, cigarettes, uncomplicated: Secondary | ICD-10-CM | POA: Diagnosis not present

## 2022-03-05 DIAGNOSIS — E7220 Disorder of urea cycle metabolism, unspecified: Secondary | ICD-10-CM | POA: Diagnosis not present

## 2022-03-05 DIAGNOSIS — E876 Hypokalemia: Secondary | ICD-10-CM | POA: Diagnosis not present

## 2022-03-05 DIAGNOSIS — Z882 Allergy status to sulfonamides status: Secondary | ICD-10-CM | POA: Diagnosis not present

## 2022-03-05 DIAGNOSIS — E162 Hypoglycemia, unspecified: Secondary | ICD-10-CM | POA: Diagnosis not present

## 2022-03-05 DIAGNOSIS — K7682 Hepatic encephalopathy: Secondary | ICD-10-CM | POA: Diagnosis not present

## 2022-03-05 DIAGNOSIS — Z9181 History of falling: Secondary | ICD-10-CM | POA: Diagnosis not present

## 2022-03-05 DIAGNOSIS — K7290 Hepatic failure, unspecified without coma: Secondary | ICD-10-CM | POA: Diagnosis not present

## 2022-03-05 DIAGNOSIS — Z743 Need for continuous supervision: Secondary | ICD-10-CM | POA: Diagnosis not present

## 2022-03-05 DIAGNOSIS — D509 Iron deficiency anemia, unspecified: Secondary | ICD-10-CM | POA: Diagnosis not present

## 2022-03-05 DIAGNOSIS — D72819 Decreased white blood cell count, unspecified: Secondary | ICD-10-CM | POA: Diagnosis not present

## 2022-03-05 NOTE — Telephone Encounter (Signed)
Tried calling patient to schedule Medicare Annual Wellness Visit (AWV) either virtually or in office.   No answer  Last AWV 12/18/20 ; please schedule at anytime with [facility] Nurse Health Advisor 1 or 2

## 2022-03-10 ENCOUNTER — Telehealth: Payer: Self-pay

## 2022-03-10 DIAGNOSIS — F1029 Alcohol dependence with unspecified alcohol-induced disorder: Secondary | ICD-10-CM

## 2022-03-10 NOTE — Telephone Encounter (Signed)
Transition Care Management Unsuccessful Follow-up Telephone Call  Date of discharge and from where:  [facility] on 03/08/22  Attempts:  1st Attempt  Reason for unsuccessful TCM follow-up call:  Left voice message

## 2022-03-11 ENCOUNTER — Telehealth: Payer: Self-pay

## 2022-03-11 NOTE — Chronic Care Management (AMB) (Signed)
  Care Coordination  Outreach Note  03/11/2022 Name: Katie Holland MRN: [id number] DOB: 1966/05/29   Care Coordination Outreach Attempts: An unsuccessful telephone outreach was attempted today to offer the patient information about available care coordination services as a benefit of their health plan.   Follow Up Plan:  Additional outreach attempts will be made to offer the patient care coordination information and services.   Encounter Outcome:  No Answer  Sig Noreene Larsson, [place], [place] [address] Direct Dial: [phone] [email or web address]

## 2022-03-11 NOTE — Addendum Note (Signed)
Addended by: Elza Rafter D on: 03/11/2022 11:42 AM   Modules accepted: Orders

## 2022-03-11 NOTE — Progress Notes (Signed)
Error opened in wrong context  

## 2022-03-11 NOTE — Chronic Care Management (AMB) (Signed)
  Care Coordination  Outreach Note  03/11/2022 Name: Katie Holland MRN: [id number] DOB: 10/16/1965   Care Coordination Outreach Attempts: An unsuccessful telephone outreach was attempted today to offer the patient information about available care coordination services as a benefit of their health plan.   Follow Up Plan:  Additional outreach attempts will be made to offer the patient care coordination information and services.   Encounter Outcome:  No Answer  Sig Noreene Larsson, [place], [place] [address] Direct Dial: [phone] [email or web address]

## 2022-03-11 NOTE — Telephone Encounter (Addendum)
Transition Care Management Follow-up Telephone Call Date of discharge and from where: 03/08/22 from [facility]. How have you been since you were released from the hospital? Son states overall "we're getting there". Pt is eating & has help coming in. States pt is "delusional" only at night. Pt states she doing good enough as expected. Any questions or concerns? No  Items Reviewed: Did the pt receive and understand the discharge instructions provided? Yes  Medications obtained and verified? Yes  Other? No  Any new allergies since your discharge? No  Dietary orders reviewed? No Do you have support at home? Yes   Home Care and Equipment/Supplies: Were home health services ordered? no  Were any new equipment or medical supplies ordered?  No   Functional Questionnaire: (I = Independent and D = Dependent) ADLs: I  Bathing/Dressing- I  Meal Prep- I  Eating- I  Maintaining continence- D  Transferring/Ambulation- I  Managing Meds- I  Follow up appointments reviewed:  PCP Hospital f/u appt confirmed? No  Family will call back to schedule. Appt scheduled with Dr Volanda Napoleon for 03/17/22 at [facility] f/u appt confirmed? No   Are transportation arrangements needed? No  If their condition worsens, is the pt aware to call PCP or go to the Emergency Dept.? Yes Was the patient provided with contact information for the PCP's office or ED? Yes Was to pt encouraged to call back with questions or concerns? Yes

## 2022-03-13 NOTE — Chronic Care Management (AMB) (Signed)
  Care Coordination  Outreach Note  03/13/2022 Name: Katie Holland MRN: [id number] DOB: [birth year]   Care Coordination Outreach Attempts: A second unsuccessful outreach was attempted today to offer the patient with information about available care coordination services as a benefit of their health plan.     Follow Up Plan:  Additional outreach attempts will be made to offer the patient care coordination information and services.   Encounter Outcome:  No Answer  Sig Noreene Larsson, [place], [place] [address] Direct Dial: [phone] [email or web address]

## 2022-03-17 ENCOUNTER — Ambulatory Visit (INDEPENDENT_AMBULATORY_CARE_PROVIDER_SITE_OTHER): Payer: Medicare Other | Admitting: Family Medicine

## 2022-03-17 VITALS — BP 120/76 | HR 75 | Temp 98.0°F | Wt 134.0 lb

## 2022-03-17 DIAGNOSIS — K7031 Alcoholic cirrhosis of liver with ascites: Secondary | ICD-10-CM | POA: Diagnosis not present

## 2022-03-17 DIAGNOSIS — Z95828 Presence of other vascular implants and grafts: Secondary | ICD-10-CM

## 2022-03-17 DIAGNOSIS — Z09 Encounter for follow-up examination after completed treatment for conditions other than malignant neoplasm: Secondary | ICD-10-CM | POA: Diagnosis not present

## 2022-03-17 DIAGNOSIS — K7682 Hepatic encephalopathy: Secondary | ICD-10-CM | POA: Diagnosis not present

## 2022-03-17 NOTE — Progress Notes (Signed)
Subjective:    Patient ID: Katie Holland, female    DOB: Jul 21, 1965, 56 y.o.   MRN: [id number]  Chief Complaint  Patient presents with   Hospitalization Follow-up    States things are fine  Pt accompanied by her daughter.  HPI Patient is a 56 year old female with pmh sig for alcoholic cirrhosis, ascites with prior esophageal banding, paracentesis, TIPS procedure on 11/02/2021 at [facility] followed by revision on 01/02/2022, drug use who was seen today for HFU, hospitalized 9/13-9/16/23 at [facility] for AMS, hepatic encephalopathy, hyperammonemia, hypoglycemia.  Per d/c summary:  pt's family reported hallucinations, combative behavior.  Pt reported Meth use days prior.  Ammonia level 120, UDS+ for benzos due to versed given by EMS.  Lactulose and rifaximin started.  UA with 3+urobilinogen, 1+calcium oxalate.  CT heat negative for acute injury.  Trpn 33 and 34, though chronically elevated in past and pt w/o CP.  EKG w/o acute changes.  CXR with persistent elevation R hemidiaphragm, concerns for pleural effusion, mild R basilar opacity suspected airway dz.  As pt only oriented to self and having 2 BMs, lactulose increased to 20 TID.  On 9/16 pt was A&Ox3.  Rifaximin d/c'd.   Advised to continue lactulose at d/c.  Pt frustrated. Pt currently living in [place] with family and does not like it.  Pt tired of abd distention and having to take meds.   States she wants to be back in [place] at her house off [address].  Situation in [place] was not healthy for pt as others may have enabled harmful behaviors/habits.  Becomes short tempered with this provider and her daughter.  Pt does not really want to have labs drawn.  Past Medical History:  Diagnosis Date   Alcoholic liver disease (HCC)    Chronic back pain    on disability   Cirrhosis of liver (HCC)    Esophageal varices (HCC)    GERD (gastroesophageal reflux disease)    Headache    HEPATIC Hydrothorax    S/P tips   Jaundice      Allergies  Allergen Reactions   Contrast Media [Iodinated Contrast Media] Itching and Swelling    Needs 13-hr prep   Latex Itching, Swelling and Other (See Comments)    No breathing impairment, however   Sulfa Antibiotics Itching, Swelling and Other (See Comments)    No breathing impairment, however    ROS General: Denies fever, chills, night sweats, changes in appetite  +changes in wt HEENT: Denies headaches, ear pain, changes in vision, rhinorrhea, sore throat CV: Denies CP, palpitations, SOB, orthopnea Pulm: Denies SOB, cough, wheezing GI: Denies abdominal pain, nausea, vomiting, diarrhea, constipation  +abd distention s/p TIPS GU: Denies dysuria, hematuria, frequency, vaginal discharge Msk: Denies muscle cramps, joint pains Neuro: Denies weakness, numbness, tingling Skin: Denies rashes, bruising Psych: Denies depression, anxiety, hallucinations     Objective:    Blood pressure 120/76, pulse 75, temperature 98 F (36.7 C), temperature source Oral, weight 134 lb (60.8 kg), SpO2 97 %.   Gen. Pleasant, well-nourished, in no distress, appears frustrated/annoyed. HEENT: [place]/AT, face symmetric, conjunctiva clear, no scleral icterus, PERRLA, EOMI, nares patent without drainage. Lungs: no accessory muscle use, CTAB, no wheezes or rales Cardiovascular: RRR, no m/r/g, no peripheral edema Abdomen: BS present, soft, distended. Musculoskeletal: No deformities, no cyanosis or clubbing, normal tone Neuro:  A&Ox3, CN II-XII intact, normal gait Skin:  Warm, no lesions/ rash   Wt Readings from Last 3 Encounters:  03/17/22 134  lb (60.8 kg)  01/02/22 140 lb (63.5 kg)  12/12/21 143 lb 3.2 oz (65 kg)    Lab Results  Component Value Date   WBC 4.0 01/02/2022   HGB 10.5 (L) 01/02/2022   HCT 34.7 (L) 01/02/2022   PLT 197 01/02/2022   GLUCOSE 181 (H) 01/02/2022   ALT 22 01/02/2022   AST 36 01/02/2022   NA 137 01/02/2022   K 3.7 01/02/2022   CL 104 01/02/2022   CREATININE 0.85  01/02/2022   BUN 11 01/02/2022   CO2 22 01/02/2022   TSH 0.319 (L) 10/22/2021   INR 1.3 (H) 01/02/2022    Assessment/Plan:  Alcoholic cirrhosis of liver with ascites (HCC)  -alcohol and drug cessation encouraged -continue lactulose  -continue f/u with GI - Plan: CMP, Ammonia, CBC with Differential/Platelet  S/P TIPS (transjugular intrahepatic portosystemic shunt)  Hospital discharge follow-up -TCM phone call made and reviewed -hospital notes, labs, imaging reviewed.  Hepatic encephalopathy -improved, stable -2/2 EtOH cirrhosis -continue lactulose  F/u prn  Grier Mitts, MD

## 2022-03-17 NOTE — Chronic Care Management (AMB) (Signed)
  Care Coordination  Outreach Note  03/17/2022 Name: Katie Holland MRN: [id number] DOB: 07-16-1965   Care Coordination Outreach Attempts: A third unsuccessful outreach was attempted today to offer the patient with information about available care coordination services as a benefit of their health plan.   Follow Up Plan:  No further outreach attempts will be made at this time. We have been unable to contact the patient to offer or enroll patient in care coordination services  Encounter Outcome:  No Answer  [place], [place], [place] [address] Direct Dial: [phone] Dailen [email or web address]

## 2022-04-03 DIAGNOSIS — U071 COVID-19: Secondary | ICD-10-CM | POA: Diagnosis not present

## 2022-04-03 DIAGNOSIS — Z743 Need for continuous supervision: Secondary | ICD-10-CM | POA: Diagnosis not present

## 2022-04-03 DIAGNOSIS — R188 Other ascites: Secondary | ICD-10-CM | POA: Diagnosis not present

## 2022-04-03 DIAGNOSIS — T50905A Adverse effect of unspecified drugs, medicaments and biological substances, initial encounter: Secondary | ICD-10-CM | POA: Diagnosis not present

## 2022-04-03 DIAGNOSIS — F17210 Nicotine dependence, cigarettes, uncomplicated: Secondary | ICD-10-CM | POA: Diagnosis not present

## 2022-04-03 DIAGNOSIS — K7682 Hepatic encephalopathy: Secondary | ICD-10-CM | POA: Diagnosis not present

## 2022-04-03 DIAGNOSIS — D649 Anemia, unspecified: Secondary | ICD-10-CM | POA: Diagnosis not present

## 2022-04-03 DIAGNOSIS — Z91199 Patient's noncompliance with other medical treatment and regimen due to unspecified reason: Secondary | ICD-10-CM | POA: Diagnosis not present

## 2022-04-03 DIAGNOSIS — R748 Abnormal levels of other serum enzymes: Secondary | ICD-10-CM | POA: Diagnosis not present

## 2022-04-03 DIAGNOSIS — Z91148 Patient's other noncompliance with medication regimen for other reason: Secondary | ICD-10-CM | POA: Diagnosis not present

## 2022-04-03 DIAGNOSIS — R7989 Other specified abnormal findings of blood chemistry: Secondary | ICD-10-CM | POA: Diagnosis not present

## 2022-04-07 ENCOUNTER — Telehealth: Payer: Self-pay

## 2022-04-07 NOTE — Telephone Encounter (Signed)
Transition Care Management Unsuccessful Follow-up Telephone Call  Date of discharge and from where:  [facility] 04/06/2022  Attempts:  1st Attempt  Reason for unsuccessful TCM follow-up call:  Left voice message

## 2022-04-08 NOTE — Telephone Encounter (Signed)
Transition Care Management Unsuccessful Follow-up Telephone Call  Date of discharge and from where:  [facility]  Attempts:  2nd Attempt  Reason for unsuccessful TCM follow-up call:  Left voice message  Juanda Crumble, [place] Direct Dial [phone]

## 2022-04-09 ENCOUNTER — Telehealth: Payer: Self-pay | Admitting: Family Medicine

## 2022-04-09 NOTE — Telephone Encounter (Signed)
Tried calling patient to schedule Medicare Annual Wellness Visit (AWV) either virtually or in office.   No answer    Last AWV  12/18/20 please schedule with Nurse Health Adviser   45 min for awv-i and in office appointments 30 min for awv-s  phone/virtual appointments  

## 2022-04-10 NOTE — Progress Notes (Incomplete)
Subjective:    Patient ID: Katie Holland, female    DOB: 1965/10/07, 56 y.o.   MRN: [id number]  Chief Complaint  Patient presents with  . Hospitalization Follow-up    States things are fine  Pt accompanied by her daughter.  HPI Patient is a 56 year old female with pmh sig for alcoholic cirrhosis, ascites with prior esophageal banding, paracentesis, TIPS procedure on 11/02/2021 at [facility] followed by revision on 01/02/2022, drug use who was seen today for HFU, hospitalized [facility]13-9/16/[facility] for AMS, hepatic encephalopathy, hyperammonemia, hypoglycemia.  Per d/c summary:  pt  Past Medical History:  Diagnosis Date  . Alcoholic liver disease ([place])   . Chronic back pain    on disability  . Cirrhosis of liver ([place])   . Esophageal varices ([place])   . GERD (gastroesophageal reflux disease)   . Headache   . HEPATIC Hydrothorax    S/P tips  . Jaundice     Allergies  Allergen Reactions  . Contrast Media [Iodinated Contrast Media] Itching and Swelling    Needs 13-hr prep  . Latex Itching, Swelling and Other (See Comments)    No breathing impairment, however  . Sulfa Antibiotics Itching, Swelling and Other (See Comments)    No breathing impairment, however    ROS General: Denies fever, chills, night sweats, changes in weight, changes in appetite HEENT: Denies headaches, ear pain, changes in vision, rhinorrhea, sore throat CV: Denies CP, palpitations, SOB, orthopnea Pulm: Denies SOB, cough, wheezing GI: Denies abdominal pain, nausea, vomiting, diarrhea, constipation GU: Denies dysuria, hematuria, frequency, vaginal discharge Msk: Denies muscle cramps, joint pains Neuro: Denies weakness, numbness, tingling Skin: Denies rashes, bruising Psych: Denies depression, anxiety, hallucinations      Objective:    Blood pressure 120/76, pulse 75, temperature 98 F (36.7 C), temperature source Oral, weight 134 lb (60.8 kg), SpO2 97 %.   Gen. Pleasant, well-nourished, in  no distress, normal affect  *** HEENT: [place]/AT, face symmetric, conjunctiva clear, no scleral icterus, PERRLA, EOMI, nares patent without drainage, pharynx without erythema or exudate. Neck: No JVD, no thyromegaly, no carotid bruits Lungs: no accessory muscle use, CTAB, no wheezes or rales Cardiovascular: RRR, no m/r/g, no ***peripheral edema Abdomen: BS present, soft, NT/ND, no hepatosplenomegaly. Musculoskeletal: No deformities, no cyanosis or clubbing, normal tone Neuro:  A&Ox3, CN II-XII intact, normal gait Skin:  Warm, no lesions/ rash   Wt Readings from Last 3 Encounters:  03/17/22 134 lb (60.8 kg)  01/02/22 140 lb (63.5 kg)  12/12/21 143 lb 3.2 oz (65 kg)    Lab Results  Component Value Date   WBC 4.0 01/02/2022   HGB 10.5 (L) 01/02/2022   HCT 34.7 (L) 01/02/2022   PLT 197 01/02/2022   GLUCOSE 181 (H) 01/02/2022   ALT 22 01/02/2022   AST 36 01/02/2022   NA 137 01/02/2022   K 3.7 01/02/2022   CL 104 01/02/2022   CREATININE 0.85 01/02/2022   BUN 11 01/02/2022   CO2 22 01/02/2022   TSH 0.319 (L) 10/22/2021   INR 1.3 (H) 01/02/2022    Assessment/Plan:  No diagnosis found.  F/u ***  Grier Mitts, MD

## 2022-04-23 ENCOUNTER — Emergency Department (HOSPITAL_COMMUNITY): Payer: Medicare Other

## 2022-04-23 ENCOUNTER — Inpatient Hospital Stay (HOSPITAL_COMMUNITY)
Admission: EM | Admit: 2022-04-23 | Discharge: 2022-04-25 | DRG: 442 | Disposition: A | Discharge status: Home Health | Payer: Medicare Other | Attending: Internal Medicine | Admitting: Internal Medicine

## 2022-04-23 DIAGNOSIS — R441 Visual hallucinations: Secondary | ICD-10-CM | POA: Diagnosis present

## 2022-04-23 DIAGNOSIS — Z91128 Patient's intentional underdosing of medication regimen for other reason: Secondary | ICD-10-CM

## 2022-04-23 DIAGNOSIS — D61818 Other pancytopenia: Secondary | ICD-10-CM | POA: Diagnosis present

## 2022-04-23 DIAGNOSIS — K219 Gastro-esophageal reflux disease without esophagitis: Secondary | ICD-10-CM | POA: Diagnosis present

## 2022-04-23 DIAGNOSIS — M549 Dorsalgia, unspecified: Secondary | ICD-10-CM | POA: Diagnosis not present

## 2022-04-23 DIAGNOSIS — R4182 Altered mental status, unspecified: Principal | ICD-10-CM

## 2022-04-23 DIAGNOSIS — K7031 Alcoholic cirrhosis of liver with ascites: Secondary | ICD-10-CM | POA: Diagnosis present

## 2022-04-23 DIAGNOSIS — T473X6A Underdosing of saline and osmotic laxatives, initial encounter: Secondary | ICD-10-CM | POA: Diagnosis present

## 2022-04-23 DIAGNOSIS — F17210 Nicotine dependence, cigarettes, uncomplicated: Secondary | ICD-10-CM | POA: Diagnosis not present

## 2022-04-23 DIAGNOSIS — E876 Hypokalemia: Secondary | ICD-10-CM | POA: Diagnosis not present

## 2022-04-23 DIAGNOSIS — Z79899 Other long term (current) drug therapy: Secondary | ICD-10-CM

## 2022-04-23 DIAGNOSIS — Z91041 Radiographic dye allergy status: Secondary | ICD-10-CM

## 2022-04-23 DIAGNOSIS — E7220 Disorder of urea cycle metabolism, unspecified: Secondary | ICD-10-CM | POA: Diagnosis not present

## 2022-04-23 DIAGNOSIS — J90 Pleural effusion, not elsewhere classified: Secondary | ICD-10-CM | POA: Diagnosis not present

## 2022-04-23 DIAGNOSIS — K709 Alcoholic liver disease, unspecified: Secondary | ICD-10-CM

## 2022-04-23 DIAGNOSIS — Z91040 Latex allergy status: Secondary | ICD-10-CM

## 2022-04-23 DIAGNOSIS — F1010 Alcohol abuse, uncomplicated: Secondary | ICD-10-CM | POA: Diagnosis present

## 2022-04-23 DIAGNOSIS — K7682 Hepatic encephalopathy: Principal | ICD-10-CM

## 2022-04-23 DIAGNOSIS — I878 Other specified disorders of veins: Secondary | ICD-10-CM | POA: Diagnosis not present

## 2022-04-23 DIAGNOSIS — Z743 Need for continuous supervision: Secondary | ICD-10-CM | POA: Diagnosis not present

## 2022-04-23 DIAGNOSIS — G8929 Other chronic pain: Secondary | ICD-10-CM | POA: Diagnosis not present

## 2022-04-23 DIAGNOSIS — D638 Anemia in other chronic diseases classified elsewhere: Secondary | ICD-10-CM | POA: Diagnosis present

## 2022-04-23 DIAGNOSIS — R410 Disorientation, unspecified: Secondary | ICD-10-CM | POA: Diagnosis not present

## 2022-04-23 DIAGNOSIS — Z66 Do not resuscitate: Secondary | ICD-10-CM | POA: Diagnosis not present

## 2022-04-23 DIAGNOSIS — G9340 Encephalopathy, unspecified: Secondary | ICD-10-CM

## 2022-04-23 DIAGNOSIS — F17200 Nicotine dependence, unspecified, uncomplicated: Secondary | ICD-10-CM | POA: Diagnosis present

## 2022-04-23 DIAGNOSIS — Z882 Allergy status to sulfonamides status: Secondary | ICD-10-CM

## 2022-04-23 DIAGNOSIS — R7989 Other specified abnormal findings of blood chemistry: Secondary | ICD-10-CM | POA: Diagnosis present

## 2022-04-23 DIAGNOSIS — I10 Essential (primary) hypertension: Secondary | ICD-10-CM | POA: Diagnosis not present

## 2022-04-23 LAB — COMPREHENSIVE METABOLIC PANEL
ALT: 14 U/L (ref 0–44)
AST: 38 U/L (ref 15–41)
Albumin: 2.6 g/dL — ABNORMAL LOW (ref 3.5–5.0)
Alkaline Phosphatase: 117 U/L (ref 38–126)
Anion gap: 11 (ref 5–15)
BUN: 8 mg/dL (ref 6–20)
CO2: 20 mmol/L — ABNORMAL LOW (ref 22–32)
Calcium: 9.1 mg/dL (ref 8.9–10.3)
Chloride: 112 mmol/L — ABNORMAL HIGH (ref 98–111)
Creatinine, Ser: 0.66 mg/dL (ref 0.44–1.00)
GFR, Estimated: >60 mL/min (ref >60)
Glucose, Bld: 82 mg/dL (ref 70–99)
Potassium: 3.6 mmol/L (ref 3.5–5.1)
Sodium: 143 mmol/L (ref 135–145)
Total Bilirubin: 0.9 mg/dL (ref 0.3–1.2)
Total Protein: 6.2 g/dL — ABNORMAL LOW (ref 6.5–8.1)

## 2022-04-23 LAB — LACTIC ACID, PLASMA
Lactic Acid, Venous: 2.4 mmol/L (ref 0.5–1.9)
Lactic Acid, Venous: 2.2 mmol/L (ref 0.5–1.9)

## 2022-04-23 LAB — I-STAT CHEM 8, ED
BUN: 8 mg/dL (ref 6–20)
Calcium, Ion: 1.15 mmol/L (ref 1.15–1.40)
Chloride: 110 mmol/L (ref 98–111)
Creatinine, Ser: 0.5 mg/dL (ref 0.44–1.00)
Glucose, Bld: 80 mg/dL (ref 70–99)
HCT: 34 % — ABNORMAL LOW (ref 36.0–46.0)
Hemoglobin: 11.6 g/dL — ABNORMAL LOW (ref 12.0–15.0)
Potassium: 3.4 mmol/L — ABNORMAL LOW (ref 3.5–5.1)
Sodium: 144 mmol/L (ref 135–145)
TCO2: 21 mmol/L — ABNORMAL LOW (ref 22–32)

## 2022-04-23 LAB — URINALYSIS, ROUTINE W REFLEX MICROSCOPIC
Bilirubin Urine: NEGATIVE
Glucose, UA: NEGATIVE mg/dL
Hgb urine dipstick: NEGATIVE
Ketones, ur: NEGATIVE mg/dL
Leukocytes,Ua: NEGATIVE
Nitrite: NEGATIVE
Protein, ur: NEGATIVE mg/dL
Specific Gravity, Urine: 1.011 (ref 1.005–1.030)
pH: 8 (ref 5.0–8.0)

## 2022-04-23 LAB — ETHANOL: Alcohol, Ethyl (B): <10 mg/dL (ref <10)

## 2022-04-23 LAB — RAPID URINE DRUG SCREEN, HOSP PERFORMED
Amphetamines: NOT DETECTED
Barbiturates: NOT DETECTED
Benzodiazepines: POSITIVE — AB
Cocaine: NOT DETECTED
Opiates: NOT DETECTED
Tetrahydrocannabinol: NOT DETECTED

## 2022-04-23 LAB — CK: Total CK: 145 U/L (ref 38–234)

## 2022-04-23 LAB — AMMONIA: Ammonia: 136 umol/L — ABNORMAL HIGH (ref 9–35)

## 2022-04-23 LAB — CBC
HCT: 33 % — ABNORMAL LOW (ref 36.0–46.0)
Hemoglobin: 9.9 g/dL — ABNORMAL LOW (ref 12.0–15.0)
MCH: 23.8 pg — ABNORMAL LOW (ref 26.0–34.0)
MCHC: 30 g/dL (ref 30.0–36.0)
MCV: 79.3 fL — ABNORMAL LOW (ref 80.0–100.0)
Platelets: 130 10*3/uL — ABNORMAL LOW (ref 150–400)
RBC: 4.16 MIL/uL (ref 3.87–5.11)
RDW: 25.3 % — ABNORMAL HIGH (ref 11.5–15.5)
WBC: 2.7 10*3/uL — ABNORMAL LOW (ref 4.0–10.5)
nRBC: 0 % (ref 0.0–0.2)

## 2022-04-23 MED ORDER — LACTULOSE 10 GM/15ML PO SOLN
30.0000 g | Freq: Once | ORAL | Status: AC
  Administered 2022-04-23: 30 g via ORAL
  Filled 2022-04-23: qty 45
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
## 2022-04-23 NOTE — H&P (Signed)
History and Physical    Patient: Katie Holland KVQ:[id number] DOB: [birth year] DOA: 04/23/2022 DOS: the patient was seen and examined on 04/23/2022 PCP: Billie Ruddy, MD  Patient coming from: Home  Chief Complaint:  Chief Complaint  Patient presents with   Altered Mental Status   HPI: Katie Holland is a 56 y.o. female with medical history significant of alcohol abuse, alcoholic liver disease with cirrhosis, history of hepatic encephalopathy, GERD, history of esophageal varices, status post TIPS, ongoing alcohol abuse with last drink being yesterday was brought in by her husband after finding the patient with altered mental status yesterday.  Patient was having hallucinations apparently.  Patient apparently had her last lactulose about 3 days ago according to the husband.  She was still talking to some family member apparently prior to all this.  Came into the ER where she was seen and evaluated.  She had markedly elevated ammonia level.  Received some lactulose and she is waking up some.  Patient at this point is suspected to have another episode of hepatic encephalopathy.  She has pancytopenia but this is chronic from her liver cirrhosis.  No significant ascites.  At this point she is being admitted to the hospital for observation and management of acute hepatic encephalopathy.  Review of Systems: As mentioned in the history of present illness. All other systems reviewed and are negative. Past Medical History:  Diagnosis Date   Alcoholic liver disease (HCC)    Chronic back pain    on disability   Cirrhosis of liver (HCC)    Esophageal varices (HCC)    GERD (gastroesophageal reflux disease)    Headache    HEPATIC Hydrothorax    S/P tips   Jaundice    Past Surgical History:  Procedure Laterality Date   BACK SURGERY     L3-L4   BIOPSY  08/07/2020   Procedure: BIOPSY;  Surgeon: Yetta Flock, MD;  Location: [facility];  Service: Gastroenterology;;   COLONOSCOPY WITH PROPOFOL N/A  08/01/2019   Procedure: COLONOSCOPY WITH PROPOFOL;  Surgeon: Yetta Flock, MD;  Location: [facility];  Service: Gastroenterology;  Laterality: N/A;   ESOPHAGEAL BANDING  04/25/2018   Procedure: ESOPHAGEAL BANDING;  Surgeon: Yetta Flock, MD;  Location: [facility]COPY;  Service: Gastroenterology;;   ESOPHAGEAL BANDING  04/27/2018   Procedure: ESOPHAGEAL BANDING;  Surgeon: Milus Banister, MD;  Location: [place];  Service: Endoscopy;;   ESOPHAGEAL BANDING  06/02/2018   Procedure: ESOPHAGEAL BANDING;  Surgeon: Doran Stabler, MD;  Location: [place];  Service: Gastroenterology;;   ESOPHAGEAL BANDING N/A 07/07/2018   Procedure: ESOPHAGEAL BANDING;  Surgeon: Yetta Flock, MD;  Location: [facility];  Service: Gastroenterology;  Laterality: N/A;   ESOPHAGEAL BANDING  07/27/2018   Procedure: ESOPHAGEAL BANDING;  Surgeon: Yetta Flock, MD;  Location: [facility];  Service: Gastroenterology;;   ESOPHAGEAL BANDING  08/30/2018   Procedure: ESOPHAGEAL BANDING;  Surgeon: Yetta Flock, MD;  Location: [facility];  Service: Gastroenterology;;   ESOPHAGOGASTRODUODENOSCOPY N/A 04/27/2018   Procedure: ESOPHAGOGASTRODUODENOSCOPY (EGD);  Surgeon: Milus Banister, MD;  Location: [facility] ENDOSCOPY;  Service: Endoscopy;  Laterality: N/A;   ESOPHAGOGASTRODUODENOSCOPY (EGD) WITH PROPOFOL N/A 04/25/2018   Procedure: ESOPHAGOGASTRODUODENOSCOPY (EGD) WITH PROPOFOL;  Surgeon: Yetta Flock, MD;  Location: [place];  Service: Gastroenterology;  Laterality: N/A;   ESOPHAGOGASTRODUODENOSCOPY (EGD) WITH PROPOFOL N/A 06/02/2018   Procedure: ESOPHAGOGASTRODUODENOSCOPY (EGD) WITH PROPOFOL;  Surgeon: Doran Stabler, MD;  Location: [place];  Service: Gastroenterology;  Laterality: N/A;   ESOPHAGOGASTRODUODENOSCOPY (EGD) WITH PROPOFOL N/A 07/07/2018   Procedure: ESOPHAGOGASTRODUODENOSCOPY (EGD) WITH PROPOFOL;  Surgeon: Yetta Flock, MD;  Location: [facility];   Service: Gastroenterology;  Laterality: N/A;   ESOPHAGOGASTRODUODENOSCOPY (EGD) WITH PROPOFOL N/A 07/27/2018   Procedure: ESOPHAGOGASTRODUODENOSCOPY (EGD) WITH PROPOFOL;  Surgeon: Yetta Flock, MD;  Location: [facility];  Service: Gastroenterology;  Laterality: N/A;   ESOPHAGOGASTRODUODENOSCOPY (EGD) WITH PROPOFOL N/A 08/30/2018   Procedure: ESOPHAGOGASTRODUODENOSCOPY (EGD) WITH PROPOFOL;  Surgeon: Yetta Flock, MD;  Location: [facility];  Service: Gastroenterology;  Laterality: N/A;   ESOPHAGOGASTRODUODENOSCOPY (EGD) WITH PROPOFOL N/A 11/08/2018   Procedure: ESOPHAGOGASTRODUODENOSCOPY (EGD) WITH PROPOFOL;  Surgeon: Yetta Flock, MD;  Location: [facility];  Service: Gastroenterology;  Laterality: N/A;   ESOPHAGOGASTRODUODENOSCOPY (EGD) WITH PROPOFOL N/A 01/31/2019   Procedure: ESOPHAGOGASTRODUODENOSCOPY (EGD) WITH PROPOFOL;  Surgeon: Yetta Flock, MD;  Location: [facility];  Service: Gastroenterology;  Laterality: N/A;   ESOPHAGOGASTRODUODENOSCOPY (EGD) WITH PROPOFOL N/A 08/01/2019   Procedure: ESOPHAGOGASTRODUODENOSCOPY (EGD) WITH PROPOFOL;  Surgeon: Yetta Flock, MD;  Location: [facility];  Service: Gastroenterology;  Laterality: N/A;   ESOPHAGOGASTRODUODENOSCOPY (EGD) WITH PROPOFOL N/A 08/07/2020   Procedure: ESOPHAGOGASTRODUODENOSCOPY (EGD) WITH PROPOFOL;  Surgeon: Yetta Flock, MD;  Location: [facility];  Service: Gastroenterology;  Laterality: N/A;   ESOPHAGOGASTRODUODENOSCOPY (EGD) WITH PROPOFOL N/A 08/22/2021   Procedure: ESOPHAGOGASTRODUODENOSCOPY (EGD) WITH PROPOFOL;  Surgeon: Yetta Flock, MD;  Location: [facility];  Service: Gastroenterology;  Laterality: N/A;   IR EMBO ART  VEN HEMORR LYMPH EXTRAV  INC GUIDE ROADMAPPING  01/02/2022   IR INTRAVASCULAR ULTRASOUND NON CORONARY  11/05/2021   IR PARACENTESIS  04/26/2018   IR PARACENTESIS  05/18/2018   IR PARACENTESIS  05/31/2018   IR PARACENTESIS  06/30/2018   IR PARACENTESIS   08/05/2018   IR PARACENTESIS  08/31/2018   IR PARACENTESIS  10/08/2018   IR PARACENTESIS  10/28/2018   IR PARACENTESIS  02/14/2019   IR PARACENTESIS  03/09/2019   IR PARACENTESIS  04/05/2019   IR PARACENTESIS  05/03/2019   IR PARACENTESIS  06/20/2019   IR PARACENTESIS  07/19/2019   IR PARACENTESIS  08/19/2019   IR PARACENTESIS  09/27/2019   IR PARACENTESIS  10/07/2019   IR PARACENTESIS  10/28/2019   IR PARACENTESIS  11/16/2019   IR PARACENTESIS  12/30/2019   IR PARACENTESIS  01/16/2020   IR PARACENTESIS  02/07/2020   IR PARACENTESIS  03/06/2020   IR PARACENTESIS  04/04/2020   IR PARACENTESIS  04/17/2020   IR PARACENTESIS  05/04/2020   IR PARACENTESIS  06/07/2020   IR PARACENTESIS  06/28/2020   IR PARACENTESIS  07/27/2020   IR PARACENTESIS  08/20/2020   IR PARACENTESIS  09/13/2020   IR PARACENTESIS  10/04/2020   IR PARACENTESIS  10/25/2020   IR PARACENTESIS  11/07/2020   IR PARACENTESIS  11/30/2020   IR PARACENTESIS  12/13/2020   IR PARACENTESIS  01/04/2021   IR PARACENTESIS  01/25/2021   IR PARACENTESIS  02/20/2021   IR PARACENTESIS  03/20/2021   IR PARACENTESIS  04/18/2021   IR PARACENTESIS  05/14/2021   IR PARACENTESIS  06/10/2021   IR PARACENTESIS  06/27/2021   IR PARACENTESIS  07/17/2021   IR PARACENTESIS  08/12/2021   IR PARACENTESIS  08/23/2021   IR PARACENTESIS  09/13/2021   IR PARACENTESIS  10/02/2021   IR PARACENTESIS  10/22/2021   IR PARACENTESIS  11/01/2021   IR PARACENTESIS  11/29/2021   IR PARACENTESIS  12/18/2021   IR PARACENTESIS  01/02/2022   IR RADIOLOGIST EVAL & MGMT  12/23/2018   IR RADIOLOGIST EVAL & MGMT  10/11/2021   IR RADIOLOGIST EVAL & MGMT  12/27/2021   IR THORACENTESIS ASP PLEURAL SPACE W/IMG GUIDE  12/10/2018   IR THORACENTESIS ASP PLEURAL SPACE W/IMG GUIDE  01/21/2019   IR THORACENTESIS ASP PLEURAL SPACE W/IMG GUIDE  10/23/2021   IR THORACENTESIS ASP PLEURAL SPACE W/IMG GUIDE  10/25/2021   IR THORACENTESIS ASP PLEURAL SPACE W/IMG GUIDE  11/01/2021   IR THORACENTESIS ASP PLEURAL SPACE W/IMG  GUIDE  01/03/2022   IR THROMBECT VENO MECH MOD SED  01/02/2022   IR TIPS  11/01/2021   IR TIPS REVISION MOD SED  01/02/2022   IR TRANSCATH RETRIEVAL FB INCL GUIDANCE (MS)  01/02/2022   IR US GUIDE VASC ACCESS RIGHT  11/01/2021   IR US GUIDE VASC ACCESS RIGHT  11/01/2021   IR US GUIDE VASC ACCESS RIGHT  11/01/2021   IR US GUIDE VASC ACCESS RIGHT  01/02/2022   RADIOLOGY WITH ANESTHESIA N/A 11/01/2021   Procedure: TIPS;  Surgeon: Criselda Peaches, MD;  Location: [place];  Service: Radiology;  Laterality: N/A;   Social History:  reports that she has been smoking cigarettes. She has a 5.00 pack-year smoking history. She has never used smokeless tobacco. She reports that she does not currently use alcohol. She reports that she does not currently use drugs.  Allergies  Allergen Reactions   Contrast Media [Iodinated Contrast Media] Itching and Swelling    Needs 13-hr prep   Latex Itching, Swelling and Other (See Comments)    No breathing impairment, however   Sulfa Antibiotics Itching, Swelling and Other (See Comments)    No breathing impairment, however    Family History  Problem Relation Age of Onset   Colon cancer Neg Hx    Esophageal cancer Neg Hx    Pancreatic cancer Neg Hx    Stomach cancer Neg Hx     Prior to Admission medications   Medication Sig Start Date End Date Taking? Authorizing Provider  diphenhydrAMINE (BENADRYL) 50 MG tablet Take 1 tablet (50 mg total) by mouth once for 1 dose. Take 1 tablet at 0830 the morning of procedure. Please bring this medication to appointment. 01/02/22 01/02/22  Tyson Alias, NP  Glucose 4-6 GM-MG CHEW Chew by mouth.    [provider]  lactulose (CHRONULAC) 10 GM/15ML solution SMARTSIG:Milliliter(s) By Mouth 03/08/22   [provider]  predniSONE (DELTASONE) 50 MG tablet Take 1 tablet at 8:30 pm the night before procedure. Take 1 tablet 2:30 am the morning before procedure. Take 1 tablet 8:30 am the morning of procedure. Bring last  dose with you to appointment. Patient not taking: Reported on 03/17/2022 01/01/22   Tyson Alias, NP    Physical Exam: Vitals:   04/23/22 1815 04/23/22 1821 04/23/22 1830 04/23/22 1845  BP: (!) 156/87  (!) 149/88 (!) 134/96  Pulse: 79   79  Resp: '15  17 15  '$ Temp:  97.9 F (36.6 C)    SpO2: 95%   97%   Constitutional: Drowsy, NAD, calm, comfortable Eyes: PERRL, lids and conjunctivae jaundiced ENMT: Mucous membranes are moist. Posterior pharynx clear of any exudate or lesions.Normal dentition.  Neck: normal, supple, no masses, no thyromegaly Respiratory: clear to auscultation bilaterally, no wheezing, no crackles. Normal respiratory effort. No accessory muscle use.  Cardiovascular: Regular rate and rhythm, no murmurs / rubs / gallops. No extremity edema. 2+ pedal pulses. No carotid bruits.  Abdomen: Mildly distended, nontender, positive organomegaly. Bowel sounds positive.  Musculoskeletal: Good range of motion, no joint swelling or tenderness, Skin: no rashes, lesions, ulcers. No induration Neurologic: CN 2-12 grossly intact. Sensation intact, DTR normal. Strength 5/5 in all 4.  Psychiatric: Drowsy, agitated initially, now responding to voice  Data Reviewed:  Sodium 143 potassium 3.4, chloride 110, glucose 80 and creatinine 0.50.  Albumin is 2.6 ammonia 136.  Lactic acid 2.2.  White count 2.7 hemoglobin 9.9 and platelets 130.  Urinalysis essentially negative.  Urine drug screen positive for benzodiazepines.  Head CT without contrast is negative abdominal x-ray and chest x-ray showed moderate to large size right pleural effusion.  Assessment and Plan:   #1 acute hepatic encephalopathy: Secondary to patient not compliant with her lactulose intake.  We will admit the patient.  Initiated lactulose.  Continue with it in the hospital.  Mental status is starting to improve after initial dose of lactulose so we will continue.  Counseling will be provided afterwards regarding medication intake  and compliance  #2 pancytopenia: Secondary to liver cirrhosis.  Continue to monitor  #3 alcohol abuse: Last drink was yesterday.  Patient at high risk for withdrawals.  We will initiate CIWA protocol.  #4 hypokalemia: Replete potassium  #5 essential hypertension: Not on any home medication at the moment.  #6 tobacco abuse: Counseling provided.  Patient offered nicotine patch if she needs it..      Advance Care Planning:   Code Status: Prior DNR  Consults: None  Family Communication: Husband at bedside  Severity of Illness: The appropriate patient status for this patient is INPATIENT. Inpatient status is judged to be reasonable and necessary in order to provide the required intensity of service to ensure the patient's safety. The patient's presenting symptoms, physical exam findings, and initial radiographic and laboratory data in the context of their chronic comorbidities is felt to place them at high risk for further clinical deterioration. Furthermore, it is not anticipated that the patient will be medically stable for discharge from the hospital within 2 midnights of admission.   * I certify that at the point of admission it is my clinical judgment that the patient will require inpatient hospital care spanning beyond 2 midnights from the point of admission due to high intensity of service, high risk for further deterioration and high frequency of surveillance required.*  AuthorBarbette Merino, MD 04/23/2022 8:23 PM  For on call review [email or web address].

## 2022-04-23 NOTE — Progress Notes (Signed)
Took off 2 sets of earrings to perform head ct.  Placed them in a biohazard bag, labeled it with pts info, and gave it to the rn.

## 2022-04-23 NOTE — ED Provider Notes (Signed)
[facility] EMERGENCY DEPARTMENT Provider Note   CSN: [id number] Arrival date & time: 04/23/22  1328     History  Chief Complaint  Patient presents with   Altered Mental Status    Katie Holland is a 56 y.o. female with past medical history significant for alcohol dependence, previous upper GI bleeds, cirrhosis with ascites, previous AMS, encephalopathy, esophageal varices who presents with concern for altered mental status.  She was found by husband who checks in on patient who lives on her own, and she was found to be having visual hallucinations, responding to internal stimuli, speaking with her mother who lives in [place], she was running on the scene from EMS and fighting family members, she was given 5 mg of IM Versed for combative behavior, on my initial assessment patient is resting comfortably, intermittently asleep, she Rouses to sternal rub.   Altered Mental Status      Home Medications Prior to Admission medications   Medication Sig Start Date End Date Taking? Authorizing Provider  acetaminophen (TYLENOL) 500 MG tablet Take 500-1,000 mg by mouth daily as needed for mild pain, headache or fever.   Yes [provider]  folic acid (FOLVITE) 1 MG tablet Take 1 tablet (1 mg total) by mouth daily. 04/25/22   Aline August, MD  lactulose (CHRONULAC) 10 GM/15ML solution Take 45 mLs (30 g total) by mouth 2 (two) times daily. 04/25/22   Aline August, MD  Multiple Vitamin (MULTIVITAMIN WITH MINERALS) TABS tablet Take 1 tablet by mouth daily. 04/25/22   Aline August, MD  predniSONE (DELTASONE) 50 MG tablet Take 1 tablet at 8:30 pm the night before procedure. Take 1 tablet 2:30 am the morning before procedure. Take 1 tablet 8:30 am the morning of procedure. Bring last dose with you to appointment. Patient not taking: Reported on 03/17/2022 01/01/22   Tyson Alias, NP  thiamine (VITAMIN B-1) 100 MG tablet Take 1 tablet (100 mg total) by mouth daily. 04/25/22    Aline August, MD      Allergies    Contrast media [iodinated contrast media], Latex, and Sulfa antibiotics    Review of Systems   Review of Systems  Reason unable to perform ROS: AMS.    Physical Exam Updated Vital Signs BP 108/87 (BP Location: Left Arm)   Pulse 93   Temp 98.3 F (36.8 C) (Oral)   Resp 18   Ht '5\' 4"'$  (1.626 m)   Wt 54.8 kg   SpO2 98%   BMI 20.74 kg/m  Physical Exam Vitals and nursing note reviewed.  Constitutional:      General: She is not in acute distress.    Appearance: She is ill-appearing.  HENT:     Head: Normocephalic and atraumatic.  Eyes:     General:        Right eye: No discharge.        Left eye: No discharge.  Cardiovascular:     Rate and Rhythm: Normal rate and regular rhythm.     Heart sounds: No murmur heard.    No friction rub. No gallop.  Pulmonary:     Effort: Pulmonary effort is normal.     Breath sounds: Normal breath sounds.  Abdominal:     General: Bowel sounds are normal. There is distension.     Palpations: Abdomen is soft.     Comments: Soft distended abdomen with evidence of ascites, no warmth, tenderness  Skin:    General: Skin is warm and dry.  Capillary Refill: Capillary refill takes less than 2 seconds.  Neurological:     Mental Status: She is alert.     Comments: Arouses to sternal rub with eye opening, spontaneous movement of extremities. No verbal response at this time s/p versed. Pupils are ERRL, patient with nonconjugate gaze, right eye front facing, with left eye deviated superior and outward, do not know if this is patient's baseline  Psychiatric:        Mood and Affect: Mood normal.        Behavior: Behavior normal.     ED Results / Procedures / Treatments   Labs (all labs ordered are listed, but only abnormal results are displayed) Labs Reviewed  COMPREHENSIVE METABOLIC PANEL - Abnormal; Notable for the following components:      Result Value   Chloride 112 (*)    CO2 20 (*)    Total Protein  6.2 (*)    Albumin 2.6 (*)    All other components within normal limits  AMMONIA - Abnormal; Notable for the following components:   Ammonia 136 (*)    All other components within normal limits  RAPID URINE DRUG SCREEN, HOSP PERFORMED - Abnormal; Notable for the following components:   Benzodiazepines POSITIVE (*)    All other components within normal limits  LACTIC ACID, PLASMA - Abnormal; Notable for the following components:   Lactic Acid, Venous 2.4 (*)    All other components within normal limits  LACTIC ACID, PLASMA - Abnormal; Notable for the following components:   Lactic Acid, Venous 2.2 (*)    All other components within normal limits  CBC - Abnormal; Notable for the following components:   WBC 2.7 (*)    Hemoglobin 9.9 (*)    HCT 33.0 (*)    MCV 79.3 (*)    MCH 23.8 (*)    RDW 25.3 (*)    Platelets 130 (*)    All other components within normal limits  CBC - Abnormal; Notable for the following components:   WBC 3.2 (*)    Hemoglobin 10.5 (*)    HCT 33.8 (*)    MCV 77.3 (*)    MCH 24.0 (*)    RDW 25.6 (*)    All other components within normal limits  COMPREHENSIVE METABOLIC PANEL - Abnormal; Notable for the following components:   Potassium 3.2 (*)    Chloride 114 (*)    CO2 20 (*)    Total Protein 6.3 (*)    Albumin 2.7 (*)    Total Bilirubin 1.5 (*)    All other components within normal limits  AMMONIA - Abnormal; Notable for the following components:   Ammonia 89 (*)    All other components within normal limits  I-STAT CHEM 8, ED - Abnormal; Notable for the following components:   Potassium 3.4 (*)    TCO2 21 (*)    Hemoglobin 11.6 (*)    HCT 34.0 (*)    All other components within normal limits  URINALYSIS, ROUTINE W REFLEX MICROSCOPIC  CK  ETHANOL    EKG EKG Interpretation  Date/Time:  Wednesday April 23 2022 15:44:55 EDT Ventricular Rate:  78 PR Interval:  157 QRS Duration: 76 QT Interval:  403 QTC Calculation: 459 R Axis:   30 Text  Interpretation: Sinus rhythm Borderline T abnormalities, anterior leads No significant change since last tracing Confirmed by Lacretia Leigh ([id number]) on 04/24/2022 11:03:10 AM  Radiology CT Head Wo Contrast  Result Date: 04/23/2022 CLINICAL DATA:  Mental  status change. EXAM: CT HEAD WITHOUT CONTRAST TECHNIQUE: Contiguous axial images were obtained from the base of the skull through the vertex without intravenous contrast. RADIATION DOSE REDUCTION: This exam was performed according to the departmental dose-optimization program which includes automated exposure control, adjustment of the mA and/or kV according to patient size and/or use of iterative reconstruction technique. COMPARISON:  None Available. FINDINGS: Brain: No evidence of acute infarction, hemorrhage, hydrocephalus, extra-axial collection or mass lesion/mass effect. There is mild patchy periventricular and deep white matter hypodensity. Vascular: No hyperdense vessel or unexpected calcification. Skull: Normal. Negative for fracture or focal lesion. Sinuses/Orbits: There is a large air-fluid level in the right maxillary sinus with mucosal thickening. Other: None. IMPRESSION: 1. No acute intracranial pathology. 2. Mild patchy periventricular and deep white matter hypodensity, likely related to small-vessel disease. 3. Large air-fluid level in the right maxillary sinus with mucosal thickening. Correlate for acute sinusitis. Electronically Signed   By: Ronney Asters M.D.   On: 04/23/2022 15:16   DG Abdomen 1 View  Result Date: 04/23/2022 CLINICAL DATA:  Altered mental status EXAM: ABDOMEN - 1 VIEW COMPARISON:  None Available. FINDINGS: Nonobstructive bowel gas pattern. Coil embolization and tips stent noted. Right upper quadrant calcifications, potentially gallstones. No acute osseous abnormality. IUD overlies the pelvis. Pelvic phleboliths. Lumbar spine fusion hardware noted. Right pleural effusion. IMPRESSION: No evidence of bowel obstruction. Right  pleural effusion. Electronically Signed   By: Maurine Simmering M.D.   On: 04/23/2022 14:59   DG Chest 1 View  Result Date: 04/23/2022 CLINICAL DATA:  Altered mental status EXAM: CHEST  1 VIEW COMPARISON:  Radiograph 01/03/2022 FINDINGS: Unchanged cardiomediastinal silhouette. There is a moderate to large size right pleural effusion with adjacent right lower lung opacification. Left lung is clear. No evidence of pneumothorax. No acute osseous abnormality. IMPRESSION: Moderate to large size right pleural effusion with adjacent right lower lung opacification, likely lower lung collapse. Electronically Signed   By: Maurine Simmering M.D.   On: 04/23/2022 14:57    Procedures Procedures    Medications Ordered in ED Medications  ondansetron (ZOFRAN) tablet 4 mg (has no administration in time range)    Or  ondansetron (ZOFRAN) injection 4 mg (has no administration in time range)  thiamine (VITAMIN B1) tablet 100 mg (100 mg Oral Given 04/25/22 0831)    Or  thiamine (VITAMIN B1) injection 100 mg ( Intravenous See Alternative 25/3/66 4403)  folic acid (FOLVITE) tablet 1 mg (1 mg Oral Given 04/25/22 0831)  multivitamin with minerals tablet 1 tablet (1 tablet Oral Given 04/25/22 0831)  lactulose (CHRONULAC) 10 GM/15ML solution 30 g (30 g Oral Given 04/25/22 0831)  melatonin tablet 5 mg (5 mg Oral Given 04/24/22 2320)  lactulose (CHRONULAC) 10 GM/15ML solution 30 g (30 g Oral Given 04/23/22 1650)  potassium chloride SA (KLOR-CON M) CR tablet 40 mEq (40 mEq Oral Given 04/24/22 2209)    ED Course/ Medical Decision Making/ A&P                           Medical Decision Making Amount and/or Complexity of Data Reviewed Labs: ordered. Radiology: ordered.  Risk Prescription drug management. Decision regarding hospitalization.   This patient is a 56 y.o. female who presents to the ED for concern of altered mental status, agitated behavior, hallucinations, this involves an extensive number of treatment options, and is  a complaint that carries with it a high risk of complications and morbidity. The emergent  differential diagnosis prior to evaluation includes, but is not limited to, hepatic encephalopathy, acute alcohol or drug intoxication, stroke, electrolyte abnormality, sepsis, psychosis, versus, Warnicke Korsakoff encephalopathy versus other.   This is not an exhaustive differential.   Past Medical History / Co-morbidities / Social History: Alcohol abuse, cirrhosis of liver with ascites, hepatic encephalopathy, tobacco use, acid reflux, esophageal varices  Additional history: Chart reviewed. Pertinent results include: Reviewed lab work, imaging from previous emergency department visits  Physical Exam: Physical exam performed. The pertinent findings include: I received report from EMS that patient was responding to internal stimuli, hallucinating, and running from EMS and fighting family members, on my arrival she had been given 5 mg Versed IM for combative behavior, she is poorly responsive, she is arousable to sternal rub, moves limbs spontaneously but is otherwise drowsy in bed  Lab Tests: I ordered, and personally interpreted labs.  The pertinent results include: CMP notable for mild hypokalemia, potassium 3.2, patient with elevated total bilirubin at 1.5, her initial ammonia is significantly elevated at 136 , initial lactic acid at 2.4   Imaging Studies: I ordered imaging studies including plain film abdomen, chest x-ray, CT head without contrast. I independently visualized and interpreted imaging which showed patient with right-sided pleural effusion, lower lung collapse, no evidence of acute intra-abdominal pathology, no evidence of acute stroke, hemorrhage, or other focal finding, some microvascular disease noted. I agree with the radiologist interpretation.   Cardiac Monitoring:  The patient was maintained on a cardiac monitor.  My attending physician Dr. Maylon Peppers viewed and interpreted the cardiac  monitored which showed an underlying rhythm of: NSR, borderline T abnormalities in anterior leads. I agree with this interpretation.   Medications: I ordered medication including lactulose, folic acid, vitamin B-1 for concern for possible alcohol intoxication, hepatic encephalopathy, covering for Warnicke Korsakoff.  Patient is still quite altered at time of my handoff and will need further evaluation likely on admission.  9:36 AM Care of Katie Holland transferred to [facility] and Dr. Doren Custard at the end of my shift as the patient will require reassessment once labs/imaging have resulted. Patient presentation, ED course, and plan of care discussed with review of all pertinent labs and imaging. Please see his/her note for further details regarding further ED course and disposition. Plan at time of handoff is pending CBC, and reevaluation of her AMS, she continues to be altered, and likely to need more lactulose, she will need admission to the hospital service for further evaluation and care. This may be altered or completely changed at the discretion of the oncoming team pending results of further workup.   I discussed this case with my attending physician Dr. Maylon Peppers who cosigned this note including patient's presenting symptoms, physical exam, and planned diagnostics and interventions. Attending physician stated agreement with plan or made changes to plan which were implemented.    Final Clinical Impression(s) / ED Diagnoses Final diagnoses:  Altered mental status, unspecified altered mental status type  Encephalopathy    Rx / DC Orders ED Discharge Orders          Ordered    folic acid (FOLVITE) 1 MG tablet  Daily        04/25/22 0908    thiamine (VITAMIN B-1) 100 MG tablet  Daily        04/25/22 0908    Multiple Vitamin (MULTIVITAMIN WITH MINERALS) TABS tablet  Daily        04/25/22 0908    lactulose (CHRONULAC) 10 GM/15ML  solution  2 times daily        04/25/22 0908    Ambulatory  referral to Gastroenterology       Comments: Hospital followup   04/25/22 0908    Increase activity slowly        04/25/22 0908    Diet - low sodium heart healthy        04/25/22 0908    Amb Referral to Palliative Care        04/25/22 0908              Dorien Chihuahua 04/25/22 0936    Kemper Durie, DO 04/25/22 1100

## 2022-04-23 NOTE — ED Notes (Signed)
Patient transported to X-ray 

## 2022-04-23 NOTE — ED Provider Notes (Signed)
Pt's care assumed at 4pm from previous provider.  Ct head  shows no acute disease.   Pt has an elevated ammonia level to 136.  Pt given lactulose.   5:15  Pt sleeping  Vitals stable,  CBC pending.  Rn reports pt had to have a repeat drawn.  CBC shows a wbc count of 2.7  platelets 130.   6: 30 pm  Pt reevaluated.  Pt's husband reports pt is not hallucinating as bad as earlier but still confused from baseline.   Hospitalist consulted for admission    Sidney Ace 04/23/22 1912    Godfrey Pick, MD 04/24/22 [phone]

## 2022-04-24 DIAGNOSIS — K7682 Hepatic encephalopathy: Secondary | ICD-10-CM | POA: Diagnosis not present

## 2022-04-24 LAB — CBC
HCT: 33.8 % — ABNORMAL LOW (ref 36.0–46.0)
Hemoglobin: 10.5 g/dL — ABNORMAL LOW (ref 12.0–15.0)
MCH: 24 pg — ABNORMAL LOW (ref 26.0–34.0)
MCHC: 31.1 g/dL (ref 30.0–36.0)
MCV: 77.3 fL — ABNORMAL LOW (ref 80.0–100.0)
Platelets: 157 10*3/uL (ref 150–400)
RBC: 4.37 MIL/uL (ref 3.87–5.11)
RDW: 25.6 % — ABNORMAL HIGH (ref 11.5–15.5)
WBC: 3.2 10*3/uL — ABNORMAL LOW (ref 4.0–10.5)
nRBC: 0 % (ref 0.0–0.2)

## 2022-04-24 LAB — COMPREHENSIVE METABOLIC PANEL
ALT: 16 U/L (ref 0–44)
AST: 33 U/L (ref 15–41)
Albumin: 2.7 g/dL — ABNORMAL LOW (ref 3.5–5.0)
Alkaline Phosphatase: 111 U/L (ref 38–126)
Anion gap: 9 (ref 5–15)
BUN: 7 mg/dL (ref 6–20)
CO2: 20 mmol/L — ABNORMAL LOW (ref 22–32)
Calcium: 9.7 mg/dL (ref 8.9–10.3)
Chloride: 114 mmol/L — ABNORMAL HIGH (ref 98–111)
Creatinine, Ser: 0.69 mg/dL (ref 0.44–1.00)
GFR, Estimated: >60 mL/min (ref >60)
Glucose, Bld: 95 mg/dL (ref 70–99)
Potassium: 3.2 mmol/L — ABNORMAL LOW (ref 3.5–5.1)
Sodium: 143 mmol/L (ref 135–145)
Total Bilirubin: 1.5 mg/dL — ABNORMAL HIGH (ref 0.3–1.2)
Total Protein: 6.3 g/dL — ABNORMAL LOW (ref 6.5–8.1)

## 2022-04-24 LAB — AMMONIA: Ammonia: 89 umol/L — ABNORMAL HIGH (ref 9–35)

## 2022-04-24 MED ORDER — MELATONIN 5 MG PO TABS
5.0000 mg | ORAL_TABLET | Freq: Every evening | ORAL | Status: DC | PRN
  Administered 2022-04-24: 5 mg via ORAL
  Filled 2022-04-24: qty 1

## 2022-04-24 MED ORDER — LACTULOSE 10 GM/15ML PO SOLN
30.0000 g | Freq: Two times a day (BID) | ORAL | Status: DC
  Administered 2022-04-24 – 2022-04-25 (×4): 30 g via ORAL
  Filled 2022-04-24: qty 45
  Filled 2022-04-24: qty 60
  Filled 2022-04-24 (×2): qty 45

## 2022-04-24 MED ORDER — ONDANSETRON HCL 4 MG/2ML IJ SOLN: 4.0000 mg | Freq: Four times a day (QID) | INTRAMUSCULAR | Status: DC | PRN

## 2022-04-24 MED ORDER — THIAMINE HCL 100 MG/ML IJ SOLN
100.0000 mg | Freq: Every day | INTRAMUSCULAR | Status: DC
  Filled 2022-04-24: qty 2

## 2022-04-24 MED ORDER — ONDANSETRON HCL 4 MG PO TABS: 4.0000 mg | ORAL_TABLET | Freq: Four times a day (QID) | ORAL | Status: DC | PRN

## 2022-04-24 MED ORDER — LORAZEPAM 1 MG PO TABS: 1.0000 mg | ORAL_TABLET | ORAL | Status: DC | PRN

## 2022-04-24 MED ORDER — ADULT MULTIVITAMIN W/MINERALS CH
1.0000 | ORAL_TABLET | Freq: Every day | ORAL | Status: DC
  Administered 2022-04-24 – 2022-04-25 (×2): 1 via ORAL
  Filled 2022-04-24 (×2): qty 1

## 2022-04-24 MED ORDER — FOLIC ACID 1 MG PO TABS
1.0000 mg | ORAL_TABLET | Freq: Every day | ORAL | Status: DC
  Administered 2022-04-24 – 2022-04-25 (×2): 1 mg via ORAL
  Filled 2022-04-24 (×2): qty 1

## 2022-04-24 MED ORDER — LORAZEPAM 2 MG/ML IJ SOLN: 0.0000 mg | Freq: Two times a day (BID) | INTRAMUSCULAR | Status: DC

## 2022-04-24 MED ORDER — THIAMINE MONONITRATE 100 MG PO TABS
100.0000 mg | ORAL_TABLET | Freq: Every day | ORAL | Status: DC
  Administered 2022-04-24 – 2022-04-25 (×2): 100 mg via ORAL
  Filled 2022-04-24 (×2): qty 1

## 2022-04-24 MED ORDER — LORAZEPAM 2 MG/ML IJ SOLN: 1.0000 mg | INTRAMUSCULAR | Status: DC | PRN

## 2022-04-24 MED ORDER — POTASSIUM CHLORIDE CRYS ER 20 MEQ PO TBCR
40.0000 meq | EXTENDED_RELEASE_TABLET | ORAL | Status: AC
  Administered 2022-04-24 (×2): 40 meq via ORAL
  Filled 2022-04-24 (×2): qty 2

## 2022-04-24 MED ORDER — LORAZEPAM 2 MG/ML IJ SOLN: 0.0000 mg | Freq: Four times a day (QID) | INTRAMUSCULAR | Status: DC

## 2022-04-24 NOTE — Progress Notes (Signed)
PROGRESS NOTE    Katie Holland  ZDG:[id number] DOB: 09/11/1965 DOA: 04/23/2022 PCP: Billie Ruddy, MD   Brief Narrative:   56 y.o. female with medical history significant of alcohol abuse, alcoholic liver disease with cirrhosis, history of hepatic encephalopathy, GERD, history of esophageal varices, status post TIPS, ongoing alcohol abuse with last drink being on day prior to presentation, noncompliance with medications including lactulose presented with altered mental status and hallucinations.  On presentation, she was somnolent with elevated ammonia level.  She was started on lactulose.  Assessment & Plan:   Acute hepatic encephalopathy Decompensated alcoholic cirrhosis of liver with recurrent ascites requiring TIPS; history of esophageal varices Elevated bilirubin Noncompliance to medications including lactulose -Patient continues to drink alcohol, last drink being prior to presentation.  She is noncompliant with medications including lactulose. -Presented with elevated ammonia level and somnolence. -Improving.  Continue lactulose. -Mental status slowly improving.  Fall precautions.  PT eval.  Avoid sedating medications.  DC lorazepam. -Not on any diuretics currently.  Outpatient follow-up with GI.  Leukopenia and anemia of chronic disease -From alcohol abuse.  Hemoglobin currently stable.  Thrombocytopenia -Resolved  Hypokalemia -Replace.  Repeat a.m. labs  Alcohol abuse -Counseled regarding abstinence.  TOC consult  Tobacco abuse -Counseling provided by prior hospitalist on admission  Essential hypertension -Monitor blood pressure.  Not on any antihypertensives at home currently.  Physical deconditioning dispo-PT eval  Goals of care -Overall prognosis is very poor.  Palliative care consultation.  If patient continues to drink alcohol and not take her medications, she should think about end-of-life care/hospice.  DVT prophylaxis: SCDs Code Status: DNR Family  Communication: Husband at bedside Disposition Plan: Status is: Inpatient Remains inpatient appropriate because: Of severity of illness    Consultants: Consult palliative care  Procedures: None  Antimicrobials: None   Subjective: Patient seen and examined at bedside.  Poor historian.  Awake, extremely slow to respond.  No fever, vomiting, seizures reported.  Objective: Vitals:   04/24/22 0300 04/24/22 0315 04/24/22 0616 04/24/22 0808  BP: (!) 118/98 129/84 132/77 108/71  Pulse: (!) 104 (!) 104 88 100  Resp: (!) '25 18 18 18  '$ Temp:  98.1 F (36.7 C) 98 F (36.7 C) 98 F (36.7 C)  TempSrc:   Oral Oral  SpO2: 94% 96% 99% 97%  Weight:   54.8 kg   Height:   '5\' 4"'$  (1.626 m)     Intake/Output Summary (Last 24 hours) at 04/24/2022 1131 Last data filed at 04/24/2022 0916 Gross per 24 hour  Intake 600 ml  Output --  Net 600 ml   Filed Weights   04/24/22 0616  Weight: 54.8 kg    Examination:  General exam: Appears calm and comfortable.  Currently on room air.  Chronically ill condition. Respiratory system: Bilateral decreased breath sounds at bases with some scattered crackles Cardiovascular system: S1 & S2 heard, mild bradycardia present gastrointestinal system: Abdomen is slightly distended, soft and nontender. Normal bowel sounds heard. Extremities: No cyanosis, clubbing, edema  Central nervous system: Awake, answers some questions, still extremely slow to respond.  No focal neurological deficits. Moving extremities Skin: No rashes, lesions or ulcers Psychiatry: Cannot be assessed because of mental status.  No signs of agitation currently.    Data Reviewed: I have personally reviewed following labs and imaging studies  CBC: Recent Labs  Lab 04/23/22 1408 04/23/22 1610 04/24/22 0321  WBC  --  2.7* 3.2*  HGB 11.6* 9.9* 10.5*  HCT 34.0* 33.0* 33.8*  MCV  --  79.3* 77.3*  PLT  --  130* 381   Basic Metabolic Panel: Recent Labs  Lab 04/23/22 1355 04/23/22 1408  04/24/22 0321  NA 143 144 143  K 3.6 3.4* 3.2*  CL 112* 110 114*  CO2 20*  --  20*  GLUCOSE 82 80 95  BUN '8 8 7  '$ CREATININE 0.66 0.50 0.69  CALCIUM 9.1  --  9.7   GFR: Estimated Creatinine Clearance: 67 mL/min (by C-G formula based on SCr of 0.69 mg/dL). Liver Function Tests: Recent Labs  Lab 04/23/22 1355 04/24/22 0321  AST 38 33  ALT 14 16  ALKPHOS 117 111  BILITOT 0.9 1.5*  PROT 6.2* 6.3*  ALBUMIN 2.6* 2.7*   No results for input(s): "LIPASE", "AMYLASE" in the last 168 hours. Recent Labs  Lab 04/23/22 1355 04/24/22 0321  AMMONIA 136* 89*   Coagulation Profile: No results for input(s): "INR", "PROTIME" in the last 168 hours. Cardiac Enzymes: Recent Labs  Lab 04/23/22 1355  CKTOTAL 145   BNP (last 3 results) No results for input(s): "PROBNP" in the last 8760 hours. HbA1C: No results for input(s): "HGBA1C" in the last 72 hours. CBG: No results for input(s): "GLUCAP" in the last 168 hours. Lipid Profile: No results for input(s): "CHOL", "HDL", "LDLCALC", "TRIG", "CHOLHDL", "LDLDIRECT" in the last 72 hours. Thyroid Function Tests: No results for input(s): "TSH", "T4TOTAL", "FREET4", "T3FREE", "THYROIDAB" in the last 72 hours. Anemia Panel: No results for input(s): "VITAMINB12", "FOLATE", "FERRITIN", "TIBC", "IRON", "RETICCTPCT" in the last 72 hours. Sepsis Labs: Recent Labs  Lab 04/23/22 1355 04/23/22 1610  LATICACIDVEN 2.4* 2.2*    No results found for this or any previous visit (from the past 240 hour(s)).       Radiology Studies: CT Head Wo Contrast  Result Date: 04/23/2022 CLINICAL DATA:  Mental status change. EXAM: CT HEAD WITHOUT CONTRAST TECHNIQUE: Contiguous axial images were obtained from the base of the skull through the vertex without intravenous contrast. RADIATION DOSE REDUCTION: This exam was performed according to the departmental dose-optimization program which includes automated exposure control, adjustment of the mA and/or kV  according to patient size and/or use of iterative reconstruction technique. COMPARISON:  None Available. FINDINGS: Brain: No evidence of acute infarction, hemorrhage, hydrocephalus, extra-axial collection or mass lesion/mass effect. There is mild patchy periventricular and deep white matter hypodensity. Vascular: No hyperdense vessel or unexpected calcification. Skull: Normal. Negative for fracture or focal lesion. Sinuses/Orbits: There is a large air-fluid level in the right maxillary sinus with mucosal thickening. Other: None. IMPRESSION: 1. No acute intracranial pathology. 2. Mild patchy periventricular and deep white matter hypodensity, likely related to small-vessel disease. 3. Large air-fluid level in the right maxillary sinus with mucosal thickening. Correlate for acute sinusitis. Electronically Signed   By: Ronney Asters M.D.   On: 04/23/2022 15:16   DG Abdomen 1 View  Result Date: 04/23/2022 CLINICAL DATA:  Altered mental status EXAM: ABDOMEN - 1 VIEW COMPARISON:  None Available. FINDINGS: Nonobstructive bowel gas pattern. Coil embolization and tips stent noted. Right upper quadrant calcifications, potentially gallstones. No acute osseous abnormality. IUD overlies the pelvis. Pelvic phleboliths. Lumbar spine fusion hardware noted. Right pleural effusion. IMPRESSION: No evidence of bowel obstruction. Right pleural effusion. Electronically Signed   By: Maurine Simmering M.D.   On: 04/23/2022 14:59   DG Chest 1 View  Result Date: 04/23/2022 CLINICAL DATA:  Altered mental status EXAM: CHEST  1 VIEW COMPARISON:  Radiograph 01/03/2022 FINDINGS: Unchanged cardiomediastinal silhouette. There is a moderate to  large size right pleural effusion with adjacent right lower lung opacification. Left lung is clear. No evidence of pneumothorax. No acute osseous abnormality. IMPRESSION: Moderate to large size right pleural effusion with adjacent right lower lung opacification, likely lower lung collapse. Electronically  Signed   By: Maurine Simmering M.D.   On: 04/23/2022 14:57        Scheduled Meds:  folic acid  1 mg Oral Daily   lactulose  30 g Oral BID   LORazepam  0-4 mg Intravenous Q6H   Followed by   Derrill Memo ON 04/26/2022] LORazepam  0-4 mg Intravenous Q12H   multivitamin with minerals  1 tablet Oral Daily   thiamine  100 mg Oral Daily   Or   thiamine  100 mg Intravenous Daily   Continuous Infusions:        Aline August, MD [facility] 04/24/2022, 11:31 AM

## 2022-04-24 NOTE — ED Notes (Signed)
ED TO INPATIENT HANDOFF REPORT  ED Nurse Name and Phone #: Macklen Wilhoite    S Name/Age/Gender Katie Holland 56 y.o. female Room/Bed: 038C/038C  Code Status   Code Status: DNR  Home/SNF/Other Home Patient oriented to: self Is this baseline? No   Triage Complete: Triage complete  Chief Complaint Hepatic encephalopathy ([place]) [K76.82]  Triage Note Pt BIB [facility] c/o AMS. Per EMS pt, agitated, combative, having visual hallucinations, responding to internal stimuli. Running on scene from EMS and fighting family members. Given '5mg'$  versed IM for combative behavior by EMS. EMS VS- HR 90, CBG 109, BP 156/80, RR 16. 100% 2L via [place]. Pt also has a hx of liver failure and lives alone, has not been taking lactulose as prescribed.    Allergies Allergies  Allergen Reactions   Contrast Media [Iodinated Contrast Media] Itching and Swelling    Needs 13-hr prep   Latex Itching, Swelling and Other (See Comments)    No breathing impairment, however   Sulfa Antibiotics Itching, Swelling and Other (See Comments)    No breathing impairment, however    Level of Care/Admitting Diagnosis ED Disposition     ED Disposition  Admit   Condition  --   Comment  Hospital Area: [facility] [[id number]]  Level of Care: Telemetry Medical [[id number]]  May admit patient to [facility] or [facility] if equivalent level of care is available:: Yes  Covid Evaluation: Asymptomatic - no recent exposure (last 10 days) testing not required  Diagnosis: Hepatic encephalopathy ([place]) [572.2.ICD-9-CM]  Admitting Physician: Elwyn Reach [[id number]]  Attending Physician: Elwyn Reach [[id number]]  Certification:: I certify this patient will need inpatient services for at least 2 midnights  Estimated Length of Stay: 4          B Medical/Surgery History Past Medical History:  Diagnosis Date   Alcoholic liver disease ([place])    Chronic back pain    on disability   Cirrhosis of liver ([place])    Esophageal varices  ([place])    GERD (gastroesophageal reflux disease)    Headache    HEPATIC Hydrothorax    S/P tips   Jaundice    Past Surgical History:  Procedure Laterality Date   BACK SURGERY     L3-L4   BIOPSY  08/07/2020   Procedure: BIOPSY;  Surgeon: Yetta Flock, MD;  [facility];  Service: Gastroenterology;;   COLONOSCOPY WITH PROPOFOL N/A 08/01/2019   Procedure: COLONOSCOPY WITH PROPOFOL;  Surgeon: Yetta Flock, MD;  [facility];  Service: Gastroenterology;  Laterality: N/A;   ESOPHAGEAL BANDING  04/25/2018   Procedure: ESOPHAGEAL BANDING;  Surgeon: Yetta Flock, MD;  Location: [facility]OSCOPY;  Service: Gastroenterology;;   ESOPHAGEAL BANDING  04/27/2018   Procedure: ESOPHAGEAL BANDING;  Surgeon: Milus Banister, MD;  Location: [place];  Service: Endoscopy;;   ESOPHAGEAL BANDING  06/02/2018   Procedure: ESOPHAGEAL BANDING;  Surgeon: Doran Stabler, MD;  Location: [place];  Service: Gastroenterology;;   ESOPHAGEAL BANDING N/A 07/07/2018   Procedure: ESOPHAGEAL BANDING;  Surgeon: Yetta Flock, MD;  [facility];  Service: Gastroenterology;  Laterality: N/A;   ESOPHAGEAL BANDING  07/27/2018   Procedure: ESOPHAGEAL BANDING;  Surgeon: Yetta Flock, MD;  [facility];  Service: Gastroenterology;;   ESOPHAGEAL BANDING  08/30/2018   Procedure: ESOPHAGEAL BANDING;  Surgeon: Yetta Flock, MD;  [facility];  Service: Gastroenterology;;   ESOPHAGOGASTRODUODENOSCOPY N/A 04/27/2018   Procedure: ESOPHAGOGASTRODUODENOSCOPY (EGD);  Surgeon: Ardis Hughs,  Melene Plan, MD;  Location[facility];  Service: Endoscopy;  Laterality: N/A;   ESOPHAGOGASTRODUODENOSCOPY (EGD) WITH PROPOFOL N/A 04/25/2018   Procedure: ESOPHAGOGASTRODUODENOSCOPY (EGD) WITH PROPOFOL;  Surgeon: Yetta Flock, MD;  Location: [place];  Service: Gastroenterology;  Laterality: N/A;   ESOPHAGOGASTRODUODENOSCOPY (EGD) WITH PROPOFOL N/A  06/02/2018   Procedure: ESOPHAGOGASTRODUODENOSCOPY (EGD) WITH PROPOFOL;  Surgeon: Doran Stabler, MD;  Location: [place];  Service: Gastroenterology;  Laterality: N/A;   ESOPHAGOGASTRODUODENOSCOPY (EGD) WITH PROPOFOL N/A 07/07/2018   Procedure: ESOPHAGOGASTRODUODENOSCOPY (EGD) WITH PROPOFOL;  Surgeon: Yetta Flock, MD;  [facility];  Service: Gastroenterology;  Laterality: N/A;   ESOPHAGOGASTRODUODENOSCOPY (EGD) WITH PROPOFOL N/A 07/27/2018   Procedure: ESOPHAGOGASTRODUODENOSCOPY (EGD) WITH PROPOFOL;  Surgeon: Yetta Flock, MD;  [facility];  Service: Gastroenterology;  Laterality: N/A;   ESOPHAGOGASTRODUODENOSCOPY (EGD) WITH PROPOFOL N/A 08/30/2018   Procedure: ESOPHAGOGASTRODUODENOSCOPY (EGD) WITH PROPOFOL;  Surgeon: Yetta Flock, MD;  [facility];  Service: Gastroenterology;  Laterality: N/A;   ESOPHAGOGASTRODUODENOSCOPY (EGD) WITH PROPOFOL N/A 11/08/2018   Procedure: ESOPHAGOGASTRODUODENOSCOPY (EGD) WITH PROPOFOL;  Surgeon: Yetta Flock, MD;  [facility];  Service: Gastroenterology;  Laterality: N/A;   ESOPHAGOGASTRODUODENOSCOPY (EGD) WITH PROPOFOL N/A 01/31/2019   Procedure: ESOPHAGOGASTRODUODENOSCOPY (EGD) WITH PROPOFOL;  Surgeon: Yetta Flock, MD;  [facility];  Service: Gastroenterology;  Laterality: N/A;   ESOPHAGOGASTRODUODENOSCOPY (EGD) WITH PROPOFOL N/A 08/01/2019   Procedure: ESOPHAGOGASTRODUODENOSCOPY (EGD) WITH PROPOFOL;  Surgeon: Yetta Flock, MD;  [facility];  Service: Gastroenterology;  Laterality: N/A;   ESOPHAGOGASTRODUODENOSCOPY (EGD) WITH PROPOFOL N/A 08/07/2020   Procedure: ESOPHAGOGASTRODUODENOSCOPY (EGD) WITH PROPOFOL;  Surgeon: Yetta Flock, MD;  [facility];  Service: Gastroenterology;  Laterality: N/A;   ESOPHAGOGASTRODUODENOSCOPY (EGD) WITH PROPOFOL N/A 08/22/2021   Procedure: ESOPHAGOGASTRODUODENOSCOPY (EGD) WITH PROPOFOL;  Surgeon:  Yetta Flock, MD;  [facility];  Service: Gastroenterology;  Laterality: N/A;   IR EMBO ART  VEN HEMORR LYMPH EXTRAV  INC GUIDE ROADMAPPING  01/02/2022   IR INTRAVASCULAR ULTRASOUND NON CORONARY  11/05/2021   IR PARACENTESIS  04/26/2018   IR PARACENTESIS  05/18/2018   IR PARACENTESIS  05/31/2018   IR PARACENTESIS  06/30/2018   IR PARACENTESIS  08/05/2018   IR PARACENTESIS  08/31/2018   IR PARACENTESIS  10/08/2018   IR PARACENTESIS  10/28/2018   IR PARACENTESIS  02/14/2019   IR PARACENTESIS  03/09/2019   IR PARACENTESIS  04/05/2019   IR PARACENTESIS  05/03/2019   IR PARACENTESIS  06/20/2019   IR PARACENTESIS  07/19/2019   IR PARACENTESIS  08/19/2019   IR PARACENTESIS  09/27/2019   IR PARACENTESIS  10/07/2019   IR PARACENTESIS  10/28/2019   IR PARACENTESIS  11/16/2019   IR PARACENTESIS  12/30/2019   IR PARACENTESIS  01/16/2020   IR PARACENTESIS  02/07/2020   IR PARACENTESIS  03/06/2020   IR PARACENTESIS  04/04/2020   IR PARACENTESIS  04/17/2020   IR PARACENTESIS  05/04/2020   IR PARACENTESIS  06/07/2020   IR PARACENTESIS  06/28/2020   IR PARACENTESIS  07/27/2020   IR PARACENTESIS  08/20/2020   IR PARACENTESIS  09/13/2020   IR PARACENTESIS  10/04/2020   IR PARACENTESIS  10/25/2020   IR PARACENTESIS  11/07/2020   IR PARACENTESIS  11/30/2020   IR PARACENTESIS  12/13/2020   IR PARACENTESIS  01/04/2021   IR PARACENTESIS  01/25/2021   IR PARACENTESIS  02/20/2021   IR PARACENTESIS  03/20/2021   IR PARACENTESIS  04/18/2021   IR  PARACENTESIS  05/14/2021   IR PARACENTESIS  06/10/2021   IR PARACENTESIS  06/27/2021   IR PARACENTESIS  07/17/2021   IR PARACENTESIS  08/12/2021   IR PARACENTESIS  08/23/2021   IR PARACENTESIS  09/13/2021   IR PARACENTESIS  10/02/2021   IR PARACENTESIS  10/22/2021   IR PARACENTESIS  11/01/2021   IR PARACENTESIS  11/29/2021   IR PARACENTESIS  12/18/2021   IR PARACENTESIS  01/02/2022   IR RADIOLOGIST EVAL & MGMT  12/23/2018   IR RADIOLOGIST EVAL & MGMT  10/11/2021   IR RADIOLOGIST EVAL &  MGMT  12/27/2021   IR THORACENTESIS ASP PLEURAL SPACE W/IMG GUIDE  12/10/2018   IR THORACENTESIS ASP PLEURAL SPACE W/IMG GUIDE  01/21/2019   IR THORACENTESIS ASP PLEURAL SPACE W/IMG GUIDE  10/23/2021   IR THORACENTESIS ASP PLEURAL SPACE W/IMG GUIDE  10/25/2021   IR THORACENTESIS ASP PLEURAL SPACE W/IMG GUIDE  11/01/2021   IR THORACENTESIS ASP PLEURAL SPACE W/IMG GUIDE  01/03/2022   IR THROMBECT VENO MECH MOD SED  01/02/2022   IR TIPS  11/01/2021   IR TIPS REVISION MOD SED  01/02/2022   IR TRANSCATH RETRIEVAL FB INCL GUIDANCE (MS)  01/02/2022   IR US GUIDE VASC ACCESS RIGHT  11/01/2021   IR US GUIDE VASC ACCESS RIGHT  11/01/2021   IR US GUIDE VASC ACCESS RIGHT  11/01/2021   IR US GUIDE VASC ACCESS RIGHT  01/02/2022   RADIOLOGY WITH ANESTHESIA N/A 11/01/2021   Procedure: TIPS;  Surgeon: Criselda Peaches, MD;  Location: [place];  Service: Radiology;  Laterality: N/A;     A IV Location/Drains/Wounds Patient Lines/Drains/Airways Status     Active Line/Drains/Airways     Name Placement date Placement time Site Days   Peripheral IV 10/30/21 22 G 1.25" Anterior;Right Forearm 10/30/21  2055  Forearm  176   Peripheral IV 01/02/22 20 G Right Antecubital 01/02/22  0807  Antecubital  112   Peripheral IV 04/23/22 20 G Anterior;Left;Proximal Forearm 04/23/22  0000  Forearm  1   External Urinary Catheter 11/04/21  1900  --  171   Wound / Incision (Open or Dehisced) 11/04/21 Puncture Chest Right;Upper;Lateral Right sided chest tube placment 11/04/21  1330  Chest  171            Intake/Output Last 24 hours No intake or output data in the 24 hours ending 04/24/22 0501  Labs/Imaging Results for orders placed or performed during the hospital encounter of 04/23/22 (from the past 48 hour(s))  Comprehensive metabolic panel     Status: Abnormal   Collection Time: 04/23/22  1:55 PM  Result Value Ref Range   Sodium 143 135 - 145 mmol/L   Potassium 3.6 3.5 - 5.1 mmol/L   Chloride 112 (H) 98 - 111 mmol/L   CO2 20  (L) 22 - 32 mmol/L   Glucose, Bld 82 70 - 99 mg/dL    Comment: Glucose reference range applies only to samples taken after fasting for at least 8 hours.   BUN 8 6 - 20 mg/dL   Creatinine, Ser 0.66 0.44 - 1.00 mg/dL   Calcium 9.1 8.9 - 10.3 mg/dL   Total Protein 6.2 (L) 6.5 - 8.1 g/dL   Albumin 2.6 (L) 3.5 - 5.0 g/dL   AST 38 15 - 41 U/L   ALT 14 0 - 44 U/L   Alkaline Phosphatase 117 38 - 126 U/L   Total Bilirubin 0.9 0.3 - 1.2 mg/dL   GFR, Estimated >60 >60  mL/min    Comment: (NOTE) Calculated using the CKD-EPI Creatinine Equation (2021)    Anion gap 11 5 - 15    Comment: Performed at [facility], [place] [address], [place], [place] [address]  Ammonia     Status: Abnormal   Collection Time: 04/23/22  1:55 PM  Result Value Ref Range   Ammonia 136 (H) 9 - 35 umol/L    Comment: Performed at [facility], [place] [address], [place], [place] [address]  Lactic acid, plasma     Status: Abnormal   Collection Time: 04/23/22  1:55 PM  Result Value Ref Range   Lactic Acid, Venous 2.4 (HH) 0.5 - 1.9 mmol/L    Comment: CRITICAL RESULT CALLED TO, READ BACK BY AND VERIFIED WITH C.CCOGAN,RN '@[id number]'$  04/23/2022 [email or web address] Performed at [facility], [place] [address], [place], [place] [address]   CK     Status: None   Collection Time: 04/23/22  1:55 PM  Result Value Ref Range   Total CK 145 38 - 234 U/L    Comment: Performed at [facility], [place] [address], [place], [place] [address]  Ethanol     Status: None   Collection Time: 04/23/22  1:55 PM  Result Value Ref Range   Alcohol, Ethyl (B) <10 <10 mg/dL    Comment: (NOTE) Lowest detectable limit for serum alcohol is 10 mg/dL.  For medical purposes only. Performed at [facility], [place] [address], [place], [place] [address]   I-stat chem 8, ED (not at [facility] or [facility])     Status: Abnormal   Collection Time: 04/23/22  2:08 PM  Result Value Ref Range   Sodium 144 135 - 145 mmol/L   Potassium 3.4 (L) 3.5 - 5.1 mmol/L   Chloride  110 98 - 111 mmol/L   BUN 8 6 - 20 mg/dL   Creatinine, Ser 0.50 0.44 - 1.00 mg/dL   Glucose, Bld 80 70 - 99 mg/dL    Comment: Glucose reference range applies only to samples taken after fasting for at least 8 hours.   Calcium, Ion 1.15 1.15 - 1.40 mmol/L   TCO2 21 (L) 22 - 32 mmol/L   Hemoglobin 11.6 (L) 12.0 - 15.0 g/dL   HCT 34.0 (L) 36.0 - 46.0 %  Lactic acid, plasma     Status: Abnormal   Collection Time: 04/23/22  4:10 PM  Result Value Ref Range   Lactic Acid, Venous 2.2 (HH) 0.5 - 1.9 mmol/L    Comment: CRITICAL VALUE NOTED.  VALUE IS CONSISTENT WITH PREVIOUSLY REPORTED AND CALLED VALUE. Performed at [facility], [place] [address] [place], [place] [address]   CBC     Status: Abnormal   Collection Time: 04/23/22  4:10 PM  Result Value Ref Range   WBC 2.7 (L) 4.0 - 10.5 K/uL   RBC 4.16 3.87 - 5.11 MIL/uL   Hemoglobin 9.9 (L) 12.0 - 15.0 g/dL   HCT 33.0 (L) 36.0 - 46.0 %   MCV 79.3 (L) 80.0 - 100.0 fL   MCH 23.8 (L) 26.0 - 34.0 pg   MCHC 30.0 30.0 - 36.0 g/dL   RDW 25.3 (H) 11.5 - 15.5 %   Platelets 130 (L) 150 - 400 K/uL    Comment: REPEATED TO VERIFY   nRBC 0.0 0.0 - 0.2 %    Comment: Performed at [facility], [place] [address], [place], [place] [address]  Urinalysis, Routine w reflex microscopic  Status: None   Collection Time: 04/23/22  6:33 PM  Result Value Ref Range   Color, Urine YELLOW YELLOW   APPearance CLEAR CLEAR   Specific Gravity, Urine 1.011 1.005 - 1.030   pH 8.0 5.0 - 8.0   Glucose, UA NEGATIVE NEGATIVE mg/dL   Hgb urine dipstick NEGATIVE NEGATIVE   Bilirubin Urine NEGATIVE NEGATIVE   Ketones, ur NEGATIVE NEGATIVE mg/dL   Protein, ur NEGATIVE NEGATIVE mg/dL   Nitrite NEGATIVE NEGATIVE   Leukocytes,Ua NEGATIVE NEGATIVE    Comment: Performed at [place] [address], [place], [place] [address]  Rapid urine drug screen (hospital performed)     Status: Abnormal   Collection Time: 04/23/22  6:33 PM  Result Value Ref Range    Opiates NONE DETECTED NONE DETECTED   Cocaine NONE DETECTED NONE DETECTED   Benzodiazepines POSITIVE (A) NONE DETECTED   Amphetamines NONE DETECTED NONE DETECTED   Tetrahydrocannabinol NONE DETECTED NONE DETECTED   Barbiturates NONE DETECTED NONE DETECTED    Comment: (NOTE) DRUG SCREEN FOR MEDICAL PURPOSES ONLY.  IF CONFIRMATION IS NEEDED FOR ANY PURPOSE, NOTIFY LAB WITHIN 5 DAYS.  LOWEST DETECTABLE LIMITS FOR URINE DRUG SCREEN Drug Class                     Cutoff (ng/mL) Amphetamine and metabolites    1000 Barbiturate and metabolites    200 Benzodiazepine                 200 Opiates and metabolites        300 Cocaine and metabolites        300 THC                            50 Performed at [facility], [place] [address], [place], [place] [address]   CBC     Status: Abnormal   Collection Time: 04/24/22  3:21 AM  Result Value Ref Range   WBC 3.2 (L) 4.0 - 10.5 K/uL   RBC 4.37 3.87 - 5.11 MIL/uL   Hemoglobin 10.5 (L) 12.0 - 15.0 g/dL   HCT 33.8 (L) 36.0 - 46.0 %   MCV 77.3 (L) 80.0 - 100.0 fL   MCH 24.0 (L) 26.0 - 34.0 pg   MCHC 31.1 30.0 - 36.0 g/dL   RDW 25.6 (H) 11.5 - 15.5 %   Platelets 157 150 - 400 K/uL    Comment: REPEATED TO VERIFY   nRBC 0.0 0.0 - 0.2 %    Comment: Performed at [facility], [place] [address], [place], [place] [address]  Comprehensive metabolic panel     Status: Abnormal   Collection Time: 04/24/22  3:21 AM  Result Value Ref Range   Sodium 143 135 - 145 mmol/L   Potassium 3.2 (L) 3.5 - 5.1 mmol/L   Chloride 114 (H) 98 - 111 mmol/L   CO2 20 (L) 22 - 32 mmol/L   Glucose, Bld 95 70 - 99 mg/dL    Comment: Glucose reference range applies only to samples taken after fasting for at least 8 hours.   BUN 7 6 - 20 mg/dL   Creatinine, Ser 0.69 0.44 - 1.00 mg/dL   Calcium 9.7 8.9 - 10.3 mg/dL   Total Protein 6.3 (L) 6.5 - 8.1 g/dL   Albumin 2.7 (L) 3.5 - 5.0 g/dL   AST 33 15 - 41 U/L   ALT 16 0 -  44 U/L   Alkaline Phosphatase 111 38 - 126 U/L    Total Bilirubin 1.5 (H) 0.3 - 1.2 mg/dL   GFR, Estimated >60 >60 mL/min    Comment: (NOTE) Calculated using the CKD-EPI Creatinine Equation (2021)    Anion gap 9 5 - 15    Comment: Performed at [facility] [address], [place], [place] [address]  Ammonia     Status: Abnormal   Collection Time: 04/24/22  3:21 AM  Result Value Ref Range   Ammonia 89 (H) 9 - 35 umol/L    Comment: Performed at [facility], [place] [address] [place], [place] [address]   CT Head Wo Contrast  Result Date: 04/23/2022 CLINICAL DATA:  Mental status change. EXAM: CT HEAD WITHOUT CONTRAST TECHNIQUE: Contiguous axial images were obtained from the base of the skull through the vertex without intravenous contrast. RADIATION DOSE REDUCTION: This exam was performed according to the departmental dose-optimization program which includes automated exposure control, adjustment of the mA and/or kV according to patient size and/or use of iterative reconstruction technique. COMPARISON:  None Available. FINDINGS: Brain: No evidence of acute infarction, hemorrhage, hydrocephalus, extra-axial collection or mass lesion/mass effect. There is mild patchy periventricular and deep white matter hypodensity. Vascular: No hyperdense vessel or unexpected calcification. Skull: Normal. Negative for fracture or focal lesion. Sinuses/Orbits: There is a large air-fluid level in the right maxillary sinus with mucosal thickening. Other: None. IMPRESSION: 1. No acute intracranial pathology. 2. Mild patchy periventricular and deep white matter hypodensity, likely related to small-vessel disease. 3. Large air-fluid level in the right maxillary sinus with mucosal thickening. Correlate for acute sinusitis. Electronically Signed   By: Ronney Asters M.D.   On: 04/23/2022 15:16   DG Abdomen 1 View  Result Date: 04/23/2022 CLINICAL DATA:  Altered mental status EXAM: ABDOMEN - 1 VIEW COMPARISON:  None Available. FINDINGS: Nonobstructive bowel gas  pattern. Coil embolization and tips stent noted. Right upper quadrant calcifications, potentially gallstones. No acute osseous abnormality. IUD overlies the pelvis. Pelvic phleboliths. Lumbar spine fusion hardware noted. Right pleural effusion. IMPRESSION: No evidence of bowel obstruction. Right pleural effusion. Electronically Signed   By: Maurine Simmering M.D.   On: 04/23/2022 14:59   DG Chest 1 View  Result Date: 04/23/2022 CLINICAL DATA:  Altered mental status EXAM: CHEST  1 VIEW COMPARISON:  Radiograph 01/03/2022 FINDINGS: Unchanged cardiomediastinal silhouette. There is a moderate to large size right pleural effusion with adjacent right lower lung opacification. Left lung is clear. No evidence of pneumothorax. No acute osseous abnormality. IMPRESSION: Moderate to large size right pleural effusion with adjacent right lower lung opacification, likely lower lung collapse. Electronically Signed   By: Maurine Simmering M.D.   On: 04/23/2022 14:57    Pending Labs Unresulted Labs (From admission, onward)    None       Vitals/Pain Today's Vitals   04/24/22 0230 04/24/22 0245 04/24/22 0300 04/24/22 0315  BP: 117/84 (!) 121/105 (!) 118/98 129/84  Pulse: 83 (!) 101 (!) 104 (!) 104  Resp: 19 19 (!) 25 18  Temp:    98.1 F (36.7 C)  TempSrc:      SpO2: 98% 95% 94% 96%  PainSc:        Isolation Precautions No active isolations  Medications Medications  ondansetron (ZOFRAN) tablet 4 mg (has no administration in time range)    Or  ondansetron (ZOFRAN) injection 4 mg (has no administration in time range)  LORazepam (ATIVAN) tablet 1-4 mg (  has no administration in time range)    Or  LORazepam (ATIVAN) injection 1-4 mg (has no administration in time range)  thiamine (VITAMIN B1) tablet 100 mg (has no administration in time range)    Or  thiamine (VITAMIN B1) injection 100 mg (has no administration in time range)  folic acid (FOLVITE) tablet 1 mg (has no administration in time range)  multivitamin  with minerals tablet 1 tablet (has no administration in time range)  LORazepam (ATIVAN) injection 0-4 mg ( Intravenous Not Given 04/24/22 0045)    Followed by  LORazepam (ATIVAN) injection 0-4 mg (has no administration in time range)  lactulose (CHRONULAC) 10 GM/15ML solution 30 g (30 g Oral Given 04/24/22 0054)  lactulose (CHRONULAC) 10 GM/15ML solution 30 g (30 g Oral Given 04/23/22 1650)    Mobility walks     Focused Assessments Neuro Assessment Handoff:  Swallow screen pass? Yes          Neuro Assessment: Exceptions to WDL Neuro Checks:      Last Documented NIHSS Modified Score:   Has TPA been given? No If patient is a Neuro Trauma and patient is going to OR before floor call report to [place] nurse: [phone] or [phone]   R Recommendations: See Admitting Provider Note  Report given to:   Additional Notes:

## 2022-04-24 NOTE — Evaluation (Signed)
Physical Therapy Evaluation Patient Details Name: Katie Holland MRN: [id number] DOB: 04-22-1966 Today's Date: 04/24/2022  History of Present Illness  56 y/o female presented to ED on 04/23/22 for AMS. Admitted for elevated ammonia levels and hepatic encephalopathy. PMH: ETOH cirrhosis, ETOH abuse  Clinical Impression  Patient admitted with the above. PTA, patient lives alone but also states her husband stays with her so unsure of complete home information. Patient presents with impaired cognition, weakness, impaired balance, and decreased activity tolerance. Patient required minA for sit to stand and short ambulation distance with no AD. Oriented to self and place during session and demos decreased awareness of safety and of deficits. Patient will benefit from skilled PT services during acute stay to address listed deficits. Recommend HHPT at discharge if family able to provide necessary supervision at home.        Recommendations for follow up therapy are one component of a multi-disciplinary discharge planning process, led by the attending physician.  Recommendations may be updated based on patient status, additional functional criteria and insurance authorization.  Follow Up Recommendations Home health PT      Assistance Recommended at Discharge Frequent or constant Supervision/Assistance  Patient can return home with the following  A little help with walking and/or transfers;A little help with bathing/dressing/bathroom;Assistance with cooking/housework;Direct supervision/assist for financial management;Direct supervision/assist for medications management;Help with stairs or ramp for entrance;Assist for transportation    Equipment Recommendations None recommended by PT  Recommendations for Other Services       Functional Status Assessment Patient has had a recent decline in their functional status and demonstrates the ability to make significant improvements in function in a reasonable and  predictable amount of time.     Precautions / Restrictions Precautions Precautions: Fall Restrictions Weight Bearing Restrictions: No      Mobility  Bed Mobility Overal bed mobility: Modified Independent                  Transfers Overall transfer level: Needs assistance Equipment used: None Transfers: Sit to/from Stand Sit to Stand: Min assist           General transfer comment: assist to steady upon standing    Ambulation/Gait Ambulation/Gait assistance: Min assist Gait Distance (Feet): 50 Feet Assistive device: None Gait Pattern/deviations: Step-through pattern, Decreased stride length, Drifts right/left Gait velocity: decreased     General Gait Details: assist for balance. Offered HHA but patient declined.  Stairs            Wheelchair Mobility    Modified Rankin (Stroke Patients Only)       Balance Overall balance assessment: Mild deficits observed, not formally tested                                           Pertinent Vitals/Pain Pain Assessment Pain Assessment: No/denies pain    Home Living Family/patient expects to be discharged to:: Private residence Living Arrangements: Alone Available Help at Discharge: Family Type of Home: Apartment Home Access: Level entry       Home Layout: One level Home Equipment: Conservation officer, nature (2 wheels);Cane - single point      Prior Function Prior Level of Function : Independent/Modified Independent             Mobility Comments: reports using cane in public       Hand Dominance  Extremity/Trunk Assessment   Upper Extremity Assessment Upper Extremity Assessment: Generalized weakness    Lower Extremity Assessment Lower Extremity Assessment: Generalized weakness    Cervical / Trunk Assessment Cervical / Trunk Assessment: Kyphotic  Communication   Communication: No difficulties  Cognition Arousal/Alertness: Awake/alert Behavior During Therapy: WFL  for tasks assessed/performed Overall Cognitive Status: Impaired/Different from baseline Area of Impairment: Orientation, Memory, Following commands, Problem solving, Awareness, Safety/judgement                 Orientation Level: Disoriented to, Situation, Time   Memory: Decreased short-term memory Following Commands: Follows one step commands with increased time Safety/Judgement: Decreased awareness of safety, Decreased awareness of deficits Awareness: Emergent Problem Solving: Difficulty sequencing          General Comments      Exercises     Assessment/Plan    PT Assessment Patient needs continued PT services  PT Problem List Decreased strength;Decreased activity tolerance;Decreased balance;Decreased mobility;Decreased cognition;Decreased safety awareness       PT Treatment Interventions DME instruction;Gait training;Functional mobility training;Therapeutic activities;Therapeutic exercise;Balance training;Patient/family education    PT Goals (Current goals can be found in the Care Plan section)  Acute Rehab PT Goals Patient Stated Goal: did not state PT Goal Formulation: Patient unable to participate in goal setting Time For Goal Achievement: 05/08/22 Potential to Achieve Goals: Fair    Frequency Min 3X/week     Co-evaluation               AM-PAC PT "6 Clicks" Mobility  Outcome Measure Help needed turning from your back to your side while in a flat bed without using bedrails?: None Help needed moving from lying on your back to sitting on the side of a flat bed without using bedrails?: None Help needed moving to and from a bed to a chair (including a wheelchair)?: A Little Help needed standing up from a chair using your arms (e.g., wheelchair or bedside chair)?: A Little Help needed to walk in hospital room?: A Little Help needed climbing 3-5 steps with a railing? : A Lot 6 Click Score: 19    End of Session Equipment Utilized During Treatment: Gait  belt Activity Tolerance: Patient tolerated treatment well Patient left: in bed;with call bell/phone within reach;with bed alarm set Nurse Communication: Mobility status PT Visit Diagnosis: Unsteadiness on feet (R26.81);Muscle weakness (generalized) (M62.81)    Time: 6010-9323 PT Time Calculation (min) (ACUTE ONLY): 18 min   Charges:   PT Evaluation $PT Eval Moderate Complexity: 1 Mod          Zavannah Deblois A. Gilford Rile PT, DPT Acute Rehabilitation Services Office [phone]   Linna Hoff 04/24/2022, 4:57 PM

## 2022-04-24 NOTE — Progress Notes (Signed)
Jewelry given to patient and husband.   Katie Holland Katie Holland

## 2022-04-24 NOTE — Plan of Care (Signed)

## 2022-04-24 NOTE — Progress Notes (Signed)
New Admission Note:  Arrival Method: By bed from ED around 0620 Mental Orientation: Alert and oriented Telemetry: Box 11, CCMD notified Assessment: Completed Skin: Completed, refer to flowsheets IV: Left forearm Pain: Denies Tubes: None Safety Measures: Safety Fall Prevention Plan was given, discussed and signed. Admission: Completed [facility] Orientation: Patient has been orientated to the room, unit and the staff. Family: Husband at bedside, pt jewelry given to husband  Orders have been reviewed and implemented. Will continue to monitor the patient. Call light has been placed within reach and bed alarm has been activated.   Fara Olden, RN  Phone Number: [phone]

## 2022-04-25 ENCOUNTER — Telehealth: Payer: Self-pay | Admitting: Family Medicine

## 2022-04-25 DIAGNOSIS — Z515 Encounter for palliative care: Secondary | ICD-10-CM

## 2022-04-25 DIAGNOSIS — K7682 Hepatic encephalopathy: Secondary | ICD-10-CM | POA: Diagnosis not present

## 2022-04-25 MED ORDER — VITAMIN B-1 100 MG PO TABS: 100.0000 mg | ORAL_TABLET | Freq: Every day | ORAL | 0 refills | Status: DC

## 2022-04-25 MED ORDER — ADULT MULTIVITAMIN W/MINERALS CH: 1.0000 | ORAL_TABLET | Freq: Every day | ORAL | Status: AC

## 2022-04-25 MED ORDER — LACTULOSE 10 GM/15ML PO SOLN: 30.0000 g | Freq: Two times a day (BID) | ORAL | 2 refills | Status: DC

## 2022-04-25 MED ORDER — FOLIC ACID 1 MG PO TABS: 1.0000 mg | ORAL_TABLET | Freq: Every day | ORAL | 0 refills | Status: DC

## 2022-04-25 NOTE — Progress Notes (Signed)
   Palliative Medicine Inpatient Follow Up Note  The palliative care medicine team has acknowledged the consultation for Ms. Katie Holland.  I have reached out to the attending, Dr. Starla Link as discharge orders have been placed this morning.  I asked if we need to prioritize [name] prior to her discharge. Dr. Starla Link shares that it would be okay for her to have outpatient palliative support at this time . An order for outpatient palliative support has been written to the transitions of care team and the medical social worker has been informed.  Palliative care will sign off at this time.  No charge  ______________________________________________________________________________________ [place] Team Team Cell Phone: [phone] Please utilize secure chat with additional questions, if there is no response within 30 minutes please call the above phone number  Palliative Medicine Team providers are available by phone from 7am to 7pm daily and can be reached through the team cell phone.  Should this patient require assistance outside of these hours, please call the patient's attending physician.

## 2022-04-25 NOTE — Telephone Encounter (Signed)
Katie Holland with [facility] is calling and they received a referral from [facility] for the pt to having palliative care and needs the order to come from dr banks fax number is [phone]

## 2022-04-25 NOTE — Evaluation (Signed)
Clinical/Bedside Swallow Evaluation Patient Details  Name: Katie Holland MRN: [id number] Date of Birth: 1965-11-05  Today's Date: 04/25/2022 Time: SLP Start Time (ACUTE ONLY): 1056 SLP Stop Time (ACUTE ONLY): 1101 SLP Time Calculation (min) (ACUTE ONLY): 5 min  Past Medical History:  Past Medical History:  Diagnosis Date   Alcoholic liver disease ([place])    Chronic back pain    on disability   Cirrhosis of liver ([facility])    Esophageal varices ([facility])    GERD (gastroesophageal reflux disease)    Headache    HEPATIC Hydrothorax    S/P tips   Jaundice    Past Surgical History:  Past Surgical History:  Procedure Laterality Date   BACK SURGERY     L3-L4   BIOPSY  08/07/2020   Procedure: BIOPSY;  Surgeon: Yetta Flock, MD;  Location: [facility];  Service: Gastroenterology;;   COLONOSCOPY WITH PROPOFOL N/A 08/01/2019   Procedure: COLONOSCOPY WITH PROPOFOL;  Surgeon: Yetta Flock, MD;  Location: [facility];  Service: Gastroenterology;  Laterality: N/A;   ESOPHAGEAL BANDING  04/25/2018   Procedure: ESOPHAGEAL BANDING;  Surgeon: Yetta Flock, MD;  Location: [facility] ENDOSCOPY;  Service: Gastroenterology;;   ESOPHAGEAL BANDING  04/27/2018   Procedure: ESOPHAGEAL BANDING;  Surgeon: Milus Banister, MD;  Location: [place];  Service: Endoscopy;;   ESOPHAGEAL BANDING  06/02/2018   Procedure: ESOPHAGEAL BANDING;  Surgeon: Doran Stabler, MD;  Location: [place];  Service: Gastroenterology;;   ESOPHAGEAL BANDING N/A 07/07/2018   Procedure: ESOPHAGEAL BANDING;  Surgeon: Yetta Flock, MD;  Location: [facility];  Service: Gastroenterology;  Laterality: N/A;   ESOPHAGEAL BANDING  07/27/2018   Procedure: ESOPHAGEAL BANDING;  Surgeon: Yetta Flock, MD;  Location: [facility];  Service: Gastroenterology;;   ESOPHAGEAL BANDING  08/30/2018   Procedure: ESOPHAGEAL BANDING;  Surgeon: Yetta Flock, MD;  Location: [facility];  Service: Gastroenterology;;    ESOPHAGOGASTRODUODENOSCOPY N/A 04/27/2018   Procedure: ESOPHAGOGASTRODUODENOSCOPY (EGD);  Surgeon: Milus Banister, MD;  Location: [facility] ENDOSCOPY;  Service: Endoscopy;  Laterality: N/A;   ESOPHAGOGASTRODUODENOSCOPY (EGD) WITH PROPOFOL N/A 04/25/2018   Procedure: ESOPHAGOGASTRODUODENOSCOPY (EGD) WITH PROPOFOL;  Surgeon: Yetta Flock, MD;  Location: [place];  Service: Gastroenterology;  Laterality: N/A;   ESOPHAGOGASTRODUODENOSCOPY (EGD) WITH PROPOFOL N/A 06/02/2018   Procedure: ESOPHAGOGASTRODUODENOSCOPY (EGD) WITH PROPOFOL;  Surgeon: Doran Stabler, MD;  Location: [place];  Service: Gastroenterology;  Laterality: N/A;   ESOPHAGOGASTRODUODENOSCOPY (EGD) WITH PROPOFOL N/A 07/07/2018   Procedure: ESOPHAGOGASTRODUODENOSCOPY (EGD) WITH PROPOFOL;  Surgeon: Yetta Flock, MD;  Location: [facility];  Service: Gastroenterology;  Laterality: N/A;   ESOPHAGOGASTRODUODENOSCOPY (EGD) WITH PROPOFOL N/A 07/27/2018   Procedure: ESOPHAGOGASTRODUODENOSCOPY (EGD) WITH PROPOFOL;  Surgeon: Yetta Flock, MD;  Location: [facility];  Service: Gastroenterology;  Laterality: N/A;   ESOPHAGOGASTRODUODENOSCOPY (EGD) WITH PROPOFOL N/A 08/30/2018   Procedure: ESOPHAGOGASTRODUODENOSCOPY (EGD) WITH PROPOFOL;  Surgeon: Yetta Flock, MD;  Location: [facility];  Service: Gastroenterology;  Laterality: N/A;   ESOPHAGOGASTRODUODENOSCOPY (EGD) WITH PROPOFOL N/A 11/08/2018   Procedure: ESOPHAGOGASTRODUODENOSCOPY (EGD) WITH PROPOFOL;  Surgeon: Yetta Flock, MD;  Location: [facility];  Service: Gastroenterology;  Laterality: N/A;   ESOPHAGOGASTRODUODENOSCOPY (EGD) WITH PROPOFOL N/A 01/31/2019   Procedure: ESOPHAGOGASTRODUODENOSCOPY (EGD) WITH PROPOFOL;  Surgeon: Yetta Flock, MD;  Location: [facility];  Service: Gastroenterology;  Laterality: N/A;   ESOPHAGOGASTRODUODENOSCOPY (EGD) WITH PROPOFOL N/A 08/01/2019   Procedure: ESOPHAGOGASTRODUODENOSCOPY (EGD) WITH PROPOFOL;  Surgeon:  Yetta Flock, MD;  Location: [facility];  Service: Gastroenterology;  Laterality: N/A;   ESOPHAGOGASTRODUODENOSCOPY (EGD) WITH PROPOFOL N/A 08/07/2020   Procedure: ESOPHAGOGASTRODUODENOSCOPY (EGD) WITH PROPOFOL;  Surgeon: Yetta Flock, MD;  Location: [facility];  Service: Gastroenterology;  Laterality: N/A;   ESOPHAGOGASTRODUODENOSCOPY (EGD) WITH PROPOFOL N/A 08/22/2021   Procedure: ESOPHAGOGASTRODUODENOSCOPY (EGD) WITH PROPOFOL;  Surgeon: Yetta Flock, MD;  Location: [facility];  Service: Gastroenterology;  Laterality: N/A;   IR EMBO ART  VEN HEMORR LYMPH EXTRAV  INC GUIDE ROADMAPPING  01/02/2022   IR INTRAVASCULAR ULTRASOUND NON CORONARY  11/05/2021   IR PARACENTESIS  04/26/2018   IR PARACENTESIS  05/18/2018   IR PARACENTESIS  05/31/2018   IR PARACENTESIS  06/30/2018   IR PARACENTESIS  08/05/2018   IR PARACENTESIS  08/31/2018   IR PARACENTESIS  10/08/2018   IR PARACENTESIS  10/28/2018   IR PARACENTESIS  02/14/2019   IR PARACENTESIS  03/09/2019   IR PARACENTESIS  04/05/2019   IR PARACENTESIS  05/03/2019   IR PARACENTESIS  06/20/2019   IR PARACENTESIS  07/19/2019   IR PARACENTESIS  08/19/2019   IR PARACENTESIS  09/27/2019   IR PARACENTESIS  10/07/2019   IR PARACENTESIS  10/28/2019   IR PARACENTESIS  11/16/2019   IR PARACENTESIS  12/30/2019   IR PARACENTESIS  01/16/2020   IR PARACENTESIS  02/07/2020   IR PARACENTESIS  03/06/2020   IR PARACENTESIS  04/04/2020   IR PARACENTESIS  04/17/2020   IR PARACENTESIS  05/04/2020   IR PARACENTESIS  06/07/2020   IR PARACENTESIS  06/28/2020   IR PARACENTESIS  07/27/2020   IR PARACENTESIS  08/20/2020   IR PARACENTESIS  09/13/2020   IR PARACENTESIS  10/04/2020   IR PARACENTESIS  10/25/2020   IR PARACENTESIS  11/07/2020   IR PARACENTESIS  11/30/2020   IR PARACENTESIS  12/13/2020   IR PARACENTESIS  01/04/2021   IR PARACENTESIS  01/25/2021   IR PARACENTESIS  02/20/2021   IR PARACENTESIS  03/20/2021   IR PARACENTESIS  04/18/2021   IR PARACENTESIS   05/14/2021   IR PARACENTESIS  06/10/2021   IR PARACENTESIS  06/27/2021   IR PARACENTESIS  07/17/2021   IR PARACENTESIS  08/12/2021   IR PARACENTESIS  08/23/2021   IR PARACENTESIS  09/13/2021   IR PARACENTESIS  10/02/2021   IR PARACENTESIS  10/22/2021   IR PARACENTESIS  11/01/2021   IR PARACENTESIS  11/29/2021   IR PARACENTESIS  12/18/2021   IR PARACENTESIS  01/02/2022   IR RADIOLOGIST EVAL & MGMT  12/23/2018   IR RADIOLOGIST EVAL & MGMT  10/11/2021   IR RADIOLOGIST EVAL & MGMT  12/27/2021   IR THORACENTESIS ASP PLEURAL SPACE W/IMG GUIDE  12/10/2018   IR THORACENTESIS ASP PLEURAL SPACE W/IMG GUIDE  01/21/2019   IR THORACENTESIS ASP PLEURAL SPACE W/IMG GUIDE  10/23/2021   IR THORACENTESIS ASP PLEURAL SPACE W/IMG GUIDE  10/25/2021   IR THORACENTESIS ASP PLEURAL SPACE W/IMG GUIDE  11/01/2021   IR THORACENTESIS ASP PLEURAL SPACE W/IMG GUIDE  01/03/2022   IR THROMBECT VENO MECH MOD SED  01/02/2022   IR TIPS  11/01/2021   IR TIPS REVISION MOD SED  01/02/2022   IR TRANSCATH RETRIEVAL FB INCL GUIDANCE (MS)  01/02/2022   IR US GUIDE VASC ACCESS RIGHT  11/01/2021   IR US GUIDE VASC ACCESS RIGHT  11/01/2021   IR US GUIDE VASC ACCESS RIGHT  11/01/2021   IR US GUIDE VASC ACCESS RIGHT  01/02/2022   RADIOLOGY WITH ANESTHESIA N/A 11/01/2021   Procedure: TIPS;  Surgeon: Criselda Peaches, MD;  Location: [place];  Service: Radiology;  Laterality: N/A;   HPI:  56 y.o. female presented with altered mental status and hallucinations.  On presentation, she was somnolent with elevated ammonia level.  She was started on lactulose.  Her mental status subsequently has much improved.  Head CT 11/1 with no acute findings.  CXR 11/1: "Moderate to large size right pleural effusion with adjacent right  lower lung opacification, likely lower lung collapse."  Pt with medical history significant of alcohol abuse, alcoholic liver disease with cirrhosis, history of hepatic encephalopathy, GERD, history of esophageal varices, status post TIPS, ongoing  alcohol abuse with last drink being on day prior to presentation, noncompliance with medications including lactulose.    Assessment / Plan / Recommendation  Clinical Impression  Pt presents with grossly normal swallowing as assessed clinically.  Pt tolerated all consistencies trialed, including straw sips of thin liquid, with no clinical s/s of aspiration.  Pt exhibited adequate oral clearance of solids.  There was mild diffuse residue which cleared with subsequent bolus trials.  Pt denies syomptoms of esophageal dysphagia, but does have d/c recommendations for follow up with GI.  Pt has no further ST needs at this time. SLP will sign off.  Recommend continuing regular texture diet with thin liquids.   SLP Visit Diagnosis: Dysphagia, unspecified (R13.10)    Aspiration Risk  No limitations    Diet Recommendation Regular;Thin liquid   Liquid Administration via: Cup;Straw Medication Administration: Whole meds with liquid Supervision: Patient able to self feed Compensations: Slow rate;Small sips/bites Postural Changes: Seated upright at 90 degrees    Other  Recommendations Oral Care Recommendations: Oral care BID    Recommendations for follow up therapy are one component of a multi-disciplinary discharge planning process, led by the attending physician.  Recommendations may be updated based on patient status, additional functional criteria and insurance authorization.  Follow up Recommendations No SLP follow up      Assistance Recommended at Discharge None  Functional Status Assessment Patient has not had a recent decline in their functional status  Frequency and Duration  (N/A)          Prognosis Prognosis for Safe Diet Advancement:  (N/A)      Swallow Study   General HPI: 56 y.o. female presented with altered mental status and hallucinations.  On presentation, she was somnolent with elevated ammonia level.  She was started on lactulose.  Her mental status subsequently has much  improved.  Head CT 11/1 with no acute findings.  CXR 11/1: "Moderate to large size right pleural effusion with adjacent right  lower lung opacification, likely lower lung collapse."  Pt with medical history significant of alcohol abuse, alcoholic liver disease with cirrhosis, history of hepatic encephalopathy, GERD, history of esophageal varices, status post TIPS, ongoing alcohol abuse with last drink being on day prior to presentation, noncompliance with medications including lactulose. Type of Study: Bedside Swallow Evaluation Previous Swallow Assessment: None Diet Prior to this Study: Regular Temperature Spikes Noted: No Respiratory Status: Room air History of Recent Intubation: No Behavior/Cognition: Alert;Cooperative;Pleasant mood Oral Cavity Assessment: Within Functional Limits Oral Care Completed by SLP: No Oral Cavity - Dentition: Adequate natural dentition Vision: Functional for self-feeding Self-Feeding Abilities: Able to feed self Patient Positioning: Upright in bed Baseline Vocal Quality: Normal Volitional Cough: Strong Volitional Swallow: Able to elicit    Oral/Motor/Sensory Function Overall Oral Motor/Sensory Function: Within functional limits Facial ROM: Within Functional Limits Facial Symmetry: Within Functional Limits Lingual ROM: Within Functional Limits Lingual  Symmetry: Within Functional Limits Lingual Strength: Within Functional Limits Velum: Within Functional Limits Mandible: Within Functional Limits   Ice Chips Ice chips: Not tested   Thin Liquid Thin Liquid: Within functional limits    Nectar Thick Nectar Thick Liquid: Not tested   Honey Thick Honey Thick Liquid: Not tested   Puree Puree: Within functional limits   Solid     Solid: Within functional limits      Celedonio Savage, [place], [place] Office: [phone] 04/25/2022,11:14 AM

## 2022-04-25 NOTE — Discharge Summary (Signed)
Physician Discharge Summary  Katie Holland LEX:[id number] DOB: 02-09-1966 DOA: 04/23/2022  PCP: Billie Ruddy, MD  Admit date: 04/23/2022 Discharge date: 04/25/2022  Admitted From: Home Disposition: Home  Recommendations for Outpatient Follow-up:  Follow up with PCP in 1 week with repeat CBC/CMP Outpatient follow-up with gastroenterology Recommend outpatient evaluation and follow-up by palliative care Comply with medications and follow-up and abstain from alcohol Follow up in ED if symptoms worsen or new appear   Home Health: Home health PT Equipment/Devices: None  Discharge Condition: Guarded to poor  CODE STATUS: DNR  diet recommendation: Heart healthy/fluid restriction of up to 1200 cc a day.  Brief/Interim Summary: 56 y.o. female with medical history significant of alcohol abuse, alcoholic liver disease with cirrhosis, history of hepatic encephalopathy, GERD, history of esophageal varices, status post TIPS, ongoing alcohol abuse with last drink being on day prior to presentation, noncompliance with medications including lactulose presented with altered mental status and hallucinations.  On presentation, she was somnolent with elevated ammonia level.  She was started on lactulose.  Her mental status subsequently has much improved.  PT recommended home health PT.  She will be discharged home today with outpatient follow-up with PCP/GI/palliative care.  Discharge Diagnoses:  Acute hepatic encephalopathy Decompensated alcoholic cirrhosis of liver with recurrent ascites requiring TIPS; history of esophageal varices Elevated bilirubin Noncompliance to medications including lactulose -Patient continues to drink alcohol, last drink being a day prior to presentation.  She is noncompliant with medications including lactulose. -Presented with elevated ammonia level and somnolence. -Treated with lactulose. -Mental status has not seen; still slow to respond.  PT recommended home health PT.   Avoid sedating medications.   -Not on any diuretics currently.  Outpatient follow-up with GI. -Discharge patient home today on oral lactulose.   Leukopenia and anemia of chronic disease -From alcohol abuse.  Hemoglobin currently stable.   Thrombocytopenia -Resolved   Hypokalemia -Replaced.  No labs today   Alcohol abuse -Counseled regarding abstinence.  TOC consulted   Tobacco abuse -Counseling provided by prior hospitalist on admission   Essential hypertension -Monitor blood pressure.  Not on any antihypertensives at home currently.   Physical deconditioning-PT recommended home health PT.   Goals of care -Overall prognosis is very poor.  Palliative care consultation is pending.  This can happen as an outpatient..  If patient continues to drink alcohol and not take her medications, she should think about end-of-life care/hospice.   Discharge Instructions  Discharge Instructions     Amb Referral to Palliative Care   Complete by: As directed    Ambulatory referral to Gastroenterology   Complete by: As directed    Hospital followup   What is the reason for referral?: Other   Diet - low sodium heart healthy   Complete by: As directed    Increase activity slowly   Complete by: As directed       Allergies as of 04/25/2022       Reactions   Contrast Media [iodinated Contrast Media] Itching, Swelling   Needs 13-hr prep   Latex Itching, Swelling, Other (See Comments)   Sulfa Antibiotics Itching, Swelling, Other (See Comments)   No breathing impairment        Medication List     STOP taking these medications    predniSONE 50 MG tablet Commonly known as: DELTASONE       TAKE these medications    acetaminophen 500 MG tablet Commonly known as: TYLENOL Take 500-1,000 mg by mouth daily as needed  for mild pain, headache or fever.   folic acid 1 MG tablet Commonly known as: FOLVITE Take 1 tablet (1 mg total) by mouth daily.   lactulose 10 GM/15ML  solution Commonly known as: CHRONULAC Take 45 mLs (30 g total) by mouth 2 (two) times daily.   multivitamin with minerals Tabs tablet Take 1 tablet by mouth daily.   thiamine 100 MG tablet Commonly known as: Vitamin B-1 Take 1 tablet (100 mg total) by mouth daily.        Follow-up Information     Billie Ruddy, MD. Schedule an appointment as soon as possible for a visit in 1 week(s).   Specialty: Family Medicine Why: with repeat cbc/cmp Contact information: [place] [address] [phone]                Allergies  Allergen Reactions   Contrast Media [Iodinated Contrast Media] Itching and Swelling    Needs 13-hr prep   Latex Itching, Swelling and Other (See Comments)   Sulfa Antibiotics Itching, Swelling and Other (See Comments)    No breathing impairment    Consultations: None   Procedures/Studies: CT Head Wo Contrast  Result Date: 04/23/2022 CLINICAL DATA:  Mental status change. EXAM: CT HEAD WITHOUT CONTRAST TECHNIQUE: Contiguous axial images were obtained from the base of the skull through the vertex without intravenous contrast. RADIATION DOSE REDUCTION: This exam was performed according to the departmental dose-optimization program which includes automated exposure control, adjustment of the mA and/or kV according to patient size and/or use of iterative reconstruction technique. COMPARISON:  None Available. FINDINGS: Brain: No evidence of acute infarction, hemorrhage, hydrocephalus, extra-axial collection or mass lesion/mass effect. There is mild patchy periventricular and deep white matter hypodensity. Vascular: No hyperdense vessel or unexpected calcification. Skull: Normal. Negative for fracture or focal lesion. Sinuses/Orbits: There is a large air-fluid level in the right maxillary sinus with mucosal thickening. Other: None. IMPRESSION: 1. No acute intracranial pathology. 2. Mild patchy periventricular and deep white matter  hypodensity, likely related to small-vessel disease. 3. Large air-fluid level in the right maxillary sinus with mucosal thickening. Correlate for acute sinusitis. Electronically Signed   By: Ronney Asters M.D.   On: 04/23/2022 15:16   DG Abdomen 1 View  Result Date: 04/23/2022 CLINICAL DATA:  Altered mental status EXAM: ABDOMEN - 1 VIEW COMPARISON:  None Available. FINDINGS: Nonobstructive bowel gas pattern. Coil embolization and tips stent noted. Right upper quadrant calcifications, potentially gallstones. No acute osseous abnormality. IUD overlies the pelvis. Pelvic phleboliths. Lumbar spine fusion hardware noted. Right pleural effusion. IMPRESSION: No evidence of bowel obstruction. Right pleural effusion. Electronically Signed   By: Maurine Simmering M.D.   On: 04/23/2022 14:59   DG Chest 1 View  Result Date: 04/23/2022 CLINICAL DATA:  Altered mental status EXAM: CHEST  1 VIEW COMPARISON:  Radiograph 01/03/2022 FINDINGS: Unchanged cardiomediastinal silhouette. There is a moderate to large size right pleural effusion with adjacent right lower lung opacification. Left lung is clear. No evidence of pneumothorax. No acute osseous abnormality. IMPRESSION: Moderate to large size right pleural effusion with adjacent right lower lung opacification, likely lower lung collapse. Electronically Signed   By: Maurine Simmering M.D.   On: 04/23/2022 14:57      Subjective: Patient seen and examined at bedside.  Husband present at bedside.  Awake, answers some questions but still slow to respond.  Wants to go home today.  No fever, vomiting, seizures reported.  Discharge Exam: Vitals:   04/25/22  5681 04/25/22 0933  BP: 117/73 108/87  Pulse: 93 93  Resp: 18 18  Temp:  98.3 F (36.8 C)  SpO2: 100% 98%    General: Pt is alert, awake, not in acute distress.  Slow to respond.  Poor historian.  Currently on room air.  Looks chronically ill in the condition. Cardiovascular: rate controlled, S1/S2 + Respiratory: bilateral  decreased breath sounds at bases Abdominal: Soft, NT, slightly distended, bowel sounds + Extremities: Trace lower extremity edema; no cyanosis    The results of significant diagnostics from this hospitalization (including imaging, microbiology, ancillary and laboratory) are listed below for reference.     Microbiology: No results found for this or any previous visit (from the past 240 hour(s)).   Labs: BNP (last 3 results) No results for input(s): "BNP" in the last 8760 hours. Basic Metabolic Panel: Recent Labs  Lab 04/23/22 1355 04/23/22 1408 04/24/22 0321  NA 143 144 143  K 3.6 3.4* 3.2*  CL 112* 110 114*  CO2 20*  --  20*  GLUCOSE 82 80 95  BUN '8 8 7  '$ CREATININE 0.66 0.50 0.69  CALCIUM 9.1  --  9.7   Liver Function Tests: Recent Labs  Lab 04/23/22 1355 04/24/22 0321  AST 38 33  ALT 14 16  ALKPHOS 117 111  BILITOT 0.9 1.5*  PROT 6.2* 6.3*  ALBUMIN 2.6* 2.7*   No results for input(s): "LIPASE", "AMYLASE" in the last 168 hours. Recent Labs  Lab 04/23/22 1355 04/24/22 0321  AMMONIA 136* 89*   CBC: Recent Labs  Lab 04/23/22 1408 04/23/22 1610 04/24/22 0321  WBC  --  2.7* 3.2*  HGB 11.6* 9.9* 10.5*  HCT 34.0* 33.0* 33.8*  MCV  --  79.3* 77.3*  PLT  --  130* 157   Cardiac Enzymes: Recent Labs  Lab 04/23/22 1355  CKTOTAL 145   BNP: Invalid input(s): "POCBNP" CBG: No results for input(s): "GLUCAP" in the last 168 hours. D-Dimer No results for input(s): "DDIMER" in the last 72 hours. Hgb A1c No results for input(s): "HGBA1C" in the last 72 hours. Lipid Profile No results for input(s): "CHOL", "HDL", "LDLCALC", "TRIG", "CHOLHDL", "LDLDIRECT" in the last 72 hours. Thyroid function studies No results for input(s): "TSH", "T4TOTAL", "T3FREE", "THYROIDAB" in the last 72 hours.  Invalid input(s): "FREET3" Anemia work up No results for input(s): "VITAMINB12", "FOLATE", "FERRITIN", "TIBC", "IRON", "RETICCTPCT" in the last 72 hours. Urinalysis     Component Value Date/Time   COLORURINE YELLOW 04/23/2022 1833   APPEARANCEUR CLEAR 04/23/2022 1833   LABSPEC 1.011 04/23/2022 1833   PHURINE 8.0 04/23/2022 1833   GLUCOSEU NEGATIVE 04/23/2022 1833   HGBUR NEGATIVE 04/23/2022 1833   BILIRUBINUR NEGATIVE 04/23/2022 1833   KETONESUR NEGATIVE 04/23/2022 1833   PROTEINUR NEGATIVE 04/23/2022 1833   NITRITE NEGATIVE 04/23/2022 1833   LEUKOCYTESUR NEGATIVE 04/23/2022 1833   Sepsis Labs Recent Labs  Lab 04/23/22 1610 04/24/22 0321  WBC 2.7* 3.2*   Microbiology No results found for this or any previous visit (from the past 240 hour(s)).   Time coordinating discharge: 35 minutes  SIGNED:   Aline August, MD  [facility] 04/25/2022, 10:03 AM

## 2022-04-25 NOTE — TOC Transition Note (Signed)
Transition of Care [facility]) - CM/SW Discharge Note   Patient Details  Name: Katie Holland MRN: [id number] Date of Birth: March 20, 1966  [facility] of [facility]) CM/SW Contact:  Tom-Johnson, Renea Ee, RN Phone Number: 04/25/2022, 10:56 AM   Clinical Narrative:     Patient is scheduled for discharge today. Home health referral called in to [facility] per patient's request and Claiborne Billings voiced acceptance, will start services on Monday. Outpatient Palliative referral called in to [facility] and Ebony Hail voiced acceptance, will reach out tom patient and spouse. Family at bedside and will transport at discharge. No further TOC needs noted.   Final next level of care: [place] Barriers to Discharge: Barriers Resolved   Patient Goals and CMS Choice Patient states their goals for this hospitalization and ongoing recovery are:: To return home CMS Medicare.gov Compare Post Acute Care list provided to:: Patient Choice offered to / list presented to : Patient  Discharge Placement                Patient to be transferred to facility by: Family      Discharge Plan and Services                DME Arranged: N/A DME Agency: NA       HH Arranged: PT HH Agency: [place] Date [place]: 04/25/22 Time HH Agency Contacted: 1030    Social Determinants of Health (SDOH) Interventions     Readmission Risk Interventions     No data to display

## 2022-04-25 NOTE — Progress Notes (Signed)
No acute distress or pain noted, discharge education provided with AVS. HH and palliative consult added. Speech eval done prior to leaving, no issues.

## 2022-04-30 NOTE — Telephone Encounter (Signed)
Manus Gunning with hospice is calling checking on the status of order

## 2022-05-01 ENCOUNTER — Encounter: Payer: Self-pay | Admitting: Internal Medicine

## 2022-05-02 ENCOUNTER — Encounter: Payer: Self-pay | Admitting: *Deleted

## 2022-05-02 ENCOUNTER — Telehealth: Payer: Self-pay | Admitting: *Deleted

## 2022-05-02 NOTE — Patient Outreach (Signed)
  Care Coordination [facility] Note Transition Care Management Unsuccessful Follow-up Telephone Call  Date of discharge and from where:  Friday, 04/25/22 Katie Holland- altered mental status, encephalopathy, ETOH abuse  Attempts:  1st Attempt  Reason for unsuccessful TCM follow-up call:  Unable to leave message  Received automated outgoing voice message stating, "your call cannot be completed at this time, please hang up and try your call again later;" no voice mail option, unable to leave message requesting call back  Oneta Rack, RN, BSN, CCRN Alumnus RN CM Care Coordination/ Transition of [place] Management [phone]: direct office

## 2022-05-05 ENCOUNTER — Encounter: Payer: Self-pay | Admitting: *Deleted

## 2022-05-05 ENCOUNTER — Telehealth: Payer: Self-pay | Admitting: *Deleted

## 2022-05-05 NOTE — Patient Outreach (Signed)
  Care Coordination [facility] Note Transition Care Management Unsuccessful Follow-up Telephone Call  Date of discharge and from where:  Friday, 11/03/23[name]; AMS/ encephalopathy/ ETOH  Attempts:  2nd Attempt  Reason for unsuccessful TCM follow-up call:  Unable to leave message  Received automated outgoing voice message stating "your call can not be completed at this time, please hang up and try again later;" unable to leave voice message requesting call back  Oneta Rack, RN, BSN, CCRN Alumnus RN CM Care Coordination/ Transition of [place] Management [phone]: direct office

## 2022-05-06 ENCOUNTER — Encounter: Payer: Self-pay | Admitting: *Deleted

## 2022-05-06 ENCOUNTER — Telehealth: Payer: Self-pay | Admitting: Family Medicine

## 2022-05-06 ENCOUNTER — Telehealth: Payer: Self-pay | Admitting: *Deleted

## 2022-05-06 NOTE — Telephone Encounter (Signed)
Tried calling patient to schedule Medicare Annual Wellness Visit (AWV) either virtually or in office.   No answer    Last AWV  12/18/20 please schedule with Nurse Health Adviser   45 min for awv-i and in office appointments 30 min for awv-s  phone/virtual appointments

## 2022-05-06 NOTE — Patient Outreach (Signed)
  Care Coordination [facility] Note Transition Care Management Unsuccessful Follow-up Telephone Call  Date of discharge and from where:  Friday, 04/25/22 [facility]; AMS/ encephalopathy/ ETOH  Attempts:  3rd Attempt  Reason for unsuccessful TCM follow-up call:  Unable to leave message  Received automated outgoing voicemail stating "your call cannot be completed at this time, please hang up and try again later;" unable to leave voice message requesting call back  Oneta Rack, RN, BSN, CCRN Alumnus RN CM Care Coordination/ Transition of [place] Management [phone]: direct office

## 2022-05-14 NOTE — Telephone Encounter (Signed)
Not sure if pt is still here in [place] or if she is in [place].  If she want palliative care that's fine.

## 2022-05-19 ENCOUNTER — Telehealth: Payer: Self-pay | Admitting: *Deleted

## 2022-05-19 ENCOUNTER — Telehealth: Payer: Self-pay | Admitting: Family Medicine

## 2022-05-19 NOTE — Patient Outreach (Signed)
  Care Coordination   05/19/2022 Name: Katie Holland MRN: [id number] DOB: 09-04-1965   Care Coordination Outreach Attempts:  An unsuccessful telephone outreach was attempted today to offer the patient information about available care coordination services as a benefit of their health plan.   Follow Up Plan:  Additional outreach attempts will be made to offer the patient care coordination information and services.   Encounter Outcome:  No Answer   Care Coordination Interventions:  No, not indicated    Raina Mina, RN Care Management Coordinator [place] Office [phone]

## 2022-05-19 NOTE — Telephone Encounter (Signed)
Requesting paperwork for handicap placard be filled out

## 2022-05-20 ENCOUNTER — Telehealth: Payer: Self-pay | Admitting: Family Medicine

## 2022-05-20 DIAGNOSIS — K7682 Hepatic encephalopathy: Secondary | ICD-10-CM | POA: Diagnosis not present

## 2022-05-20 DIAGNOSIS — Z8719 Personal history of other diseases of the digestive system: Secondary | ICD-10-CM | POA: Diagnosis not present

## 2022-05-20 DIAGNOSIS — E43 Unspecified severe protein-calorie malnutrition: Secondary | ICD-10-CM | POA: Diagnosis not present

## 2022-05-20 DIAGNOSIS — E872 Acidosis, unspecified: Secondary | ICD-10-CM | POA: Diagnosis not present

## 2022-05-20 DIAGNOSIS — K219 Gastro-esophageal reflux disease without esophagitis: Secondary | ICD-10-CM | POA: Diagnosis not present

## 2022-05-20 DIAGNOSIS — Z87440 Personal history of urinary (tract) infections: Secondary | ICD-10-CM | POA: Diagnosis not present

## 2022-05-20 DIAGNOSIS — I10 Essential (primary) hypertension: Secondary | ICD-10-CM | POA: Diagnosis not present

## 2022-05-20 DIAGNOSIS — Z9181 History of falling: Secondary | ICD-10-CM | POA: Diagnosis not present

## 2022-05-20 DIAGNOSIS — F17200 Nicotine dependence, unspecified, uncomplicated: Secondary | ICD-10-CM | POA: Diagnosis not present

## 2022-05-20 DIAGNOSIS — E871 Hypo-osmolality and hyponatremia: Secondary | ICD-10-CM | POA: Diagnosis not present

## 2022-05-20 DIAGNOSIS — E876 Hypokalemia: Secondary | ICD-10-CM | POA: Diagnosis not present

## 2022-05-20 NOTE — Telephone Encounter (Signed)
[facility]  Katie Holland [phone]  Ok to leave a detailed message on this line  Verbal Order - PT  Once a week for 7 weeks

## 2022-05-21 ENCOUNTER — Telehealth: Payer: Self-pay | Admitting: Family Medicine

## 2022-05-21 NOTE — Telephone Encounter (Signed)
Tried calling patient to schedule Medicare Annual Wellness Visit (AWV) either virtually or in office.   No answer   Last AWV  12/18/20 please schedule with Nurse Health Adviser   45 min for awv-i and in office appointments 30 min for awv-s  phone/virtual appointments

## 2022-05-23 NOTE — Telephone Encounter (Signed)
Gennaro Africa from [facility] call back and stated she want a call back about a verbal order for PT on this pt.

## 2022-05-26 NOTE — Telephone Encounter (Signed)
Katie Holland called to follow up on verbal orders for patient. Orders are for physical therapy once a week for seven weeks. Ok to leave a voicemail at [phone]        Please advise

## 2022-05-30 ENCOUNTER — Telehealth: Payer: Self-pay | Admitting: Family Medicine

## 2022-05-30 NOTE — Telephone Encounter (Signed)
Ok
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
## 2022-06-02 NOTE — Telephone Encounter (Signed)
Left a detailed message for Anda Kraft of "ok" for VO per Dr. Volanda Napoleon and call us if have any questions.

## 2022-06-09 ENCOUNTER — Ambulatory Visit (INDEPENDENT_AMBULATORY_CARE_PROVIDER_SITE_OTHER): Payer: Medicare Other | Admitting: Family Medicine

## 2022-06-09 VITALS — BP 142/86 | HR 82 | Temp 98.6°F | Wt 137.8 lb

## 2022-06-09 DIAGNOSIS — Z95828 Presence of other vascular implants and grafts: Secondary | ICD-10-CM

## 2022-06-09 DIAGNOSIS — K7031 Alcoholic cirrhosis of liver with ascites: Secondary | ICD-10-CM

## 2022-06-09 MED ORDER — LACTULOSE 10 GM/15ML PO SOLN: 30.0000 g | Freq: Two times a day (BID) | ORAL | 12 refills | Status: DC

## 2022-06-09 NOTE — Patient Instructions (Addendum)
Set up a follow up appointment with the gastroenterologist if you haven't done so already.    We will see each other in a month or 2 to see how you are doing.

## 2022-06-09 NOTE — Progress Notes (Signed)
Subjective:    Patient ID: Katie Holland, female    DOB: 06-Mar-1966, 56 y.o.   MRN: [id number]  Chief Complaint  Patient presents with   Hospitalization Follow-up  Pt accompanied by her husband.  HPI Patient was seen today for f/u.  Pt was hospitalized on 11/1-11/3/23 for Hepatic encephalopathy at [facility].  Pt also notes recent hospitalization at [facility] in [place].  States she was put in the psychiatric unit by her son.  No records available.  Pt states she is taking Lactulose, but it is too sweet.  Appetite is good.  Pt denies EtOH use.    Pt has a new grandson.  Past Medical History:  Diagnosis Date   Alcoholic liver disease ([place])    Chronic back pain    on disability   Cirrhosis of liver (H[facility])    Esophageal varices (H[facility])    GERD (gastroesophageal reflux disease)    Headache    HEPATIC Hydrothorax    S/P tips   Jaundice     Allergies  Allergen Reactions   Contrast Media [Iodinated Contrast Media] Itching and Swelling    Needs 13-hr prep   Latex Itching, Swelling and Other (See Comments)   Sulfa Antibiotics Itching, Swelling and Other (See Comments)    No breathing impairment    ROS General: Denies fever, chills, night sweats, changes in weight, changes in appetite HEENT: Denies headaches, ear pain, changes in vision, rhinorrhea, sore throat CV: Denies CP, palpitations, SOB, orthopnea Pulm: Denies SOB, cough, wheezing GI: Denies abdominal pain, nausea, vomiting, diarrhea, constipation GU: Denies dysuria, hematuria, frequency, vaginal discharge Msk: Denies muscle cramps, joint pains Neuro: Denies weakness, numbness, tingling Skin: Denies rashes, bruising Psych: Denies depression, anxiety, hallucinations     Objective:    Blood pressure (!) 142/86, pulse 82, temperature 98.6 F (37 C), temperature source Oral, weight 137 lb 12.8 oz (62.5 kg), SpO2 93 %.   Gen. Pleasant, well-nourished, in no distress, normal affect   HEENT: [place]/AT, face  symmetric, conjunctiva clear, no scleral icterus, PERRLA, EOMI, nares patent without drainage Lungs: no accessory muscle use, CTAB, no wheezes or rales Cardiovascular: RRR, no m/r/g, no peripheral edema Abdomen: BS present, soft, NT, moderately distended Neuro:  A&Ox3, CN II-XII intact, normal gait Skin:  Warm, no lesions/ rash   Wt Readings from Last 3 Encounters:  06/09/22 137 lb 12.8 oz (62.5 kg)  04/25/22 120 lb 13 oz (54.8 kg)  03/17/22 134 lb (60.8 kg)    Lab Results  Component Value Date   WBC 3.2 (L) 04/24/2022   HGB 10.5 (L) 04/24/2022   HCT 33.8 (L) 04/24/2022   PLT 157 04/24/2022   GLUCOSE 95 04/24/2022   ALT 16 04/24/2022   AST 33 04/24/2022   NA 143 04/24/2022   K 3.2 (L) 04/24/2022   CL 114 (H) 04/24/2022   CREATININE 0.69 04/24/2022   BUN 7 04/24/2022   CO2 20 (L) 04/24/2022   TSH 0.319 (L) 10/22/2021   INR 1.3 (H) 01/02/2022    Assessment/Plan:  Alcoholic cirrhosis of liver with ascites ([place]) - Plan: CBC with Differential/Platelet, CMP, lactulose ([place]) 10 GM/15ML solution  S/P TIPS (transjugular intrahepatic portosystemic shunt)  Hospital follow-up from admission in November.  Stable.  Obtain labs.  Continued alcohol cessation strongly encouraged.  Continue current medications including lactulose.  Advised to schedule follow-up with GI.  F/u in 2-3 months  Grier Mitts, MD

## 2022-06-10 LAB — COMPREHENSIVE METABOLIC PANEL
ALT: 14 U/L (ref 0–35)
AST: 28 U/L (ref 0–37)
Albumin: 3.5 g/dL (ref 3.5–5.2)
Alkaline Phosphatase: 155 U/L — ABNORMAL HIGH (ref 39–117)
BUN: 10 mg/dL (ref 6–23)
CO2: 24 mEq/L (ref 19–32)
Calcium: 9.8 mg/dL (ref 8.4–10.5)
Chloride: 106 mEq/L (ref 96–112)
Creatinine, Ser: 0.65 mg/dL (ref 0.40–1.20)
GFR: 97.72 mL/min (ref >60.00)
Glucose, Bld: 70 mg/dL (ref 70–99)
Potassium: 3.6 mEq/L (ref 3.5–5.1)
Sodium: 137 mEq/L (ref 135–145)
Total Bilirubin: 1.1 mg/dL (ref 0.2–1.2)
Total Protein: 7.3 g/dL (ref 6.0–8.3)

## 2022-06-10 LAB — CBC WITH DIFFERENTIAL/PLATELET
Basophils Absolute: 0 10*3/uL (ref 0.0–0.1)
Basophils Relative: 0.6 % (ref 0.0–3.0)
Eosinophils Absolute: 0.5 10*3/uL (ref 0.0–0.7)
Eosinophils Relative: 10.6 % — ABNORMAL HIGH (ref 0.0–5.0)
HCT: 38.2 % (ref 36.0–46.0)
Hemoglobin: 12.1 g/dL (ref 12.0–15.0)
Lymphocytes Relative: 34.4 % (ref 12.0–46.0)
Lymphs Abs: 1.5 10*3/uL (ref 0.7–4.0)
MCHC: 31.7 g/dL (ref 30.0–36.0)
MCV: 75.5 fl — ABNORMAL LOW (ref 78.0–100.0)
Monocytes Absolute: 0.5 10*3/uL (ref 0.1–1.0)
Monocytes Relative: 11.7 % (ref 3.0–12.0)
Neutro Abs: 1.9 10*3/uL (ref 1.4–7.7)
Neutrophils Relative %: 42.7 % — ABNORMAL LOW (ref 43.0–77.0)
Platelets: 219 10*3/uL (ref 150.0–400.0)
RBC: 5.05 Mil/uL (ref 3.87–5.11)
RDW: 23.4 % — ABNORMAL HIGH (ref 11.5–15.5)
WBC: 4.4 10*3/uL (ref 4.0–10.5)

## 2022-06-24 ENCOUNTER — Ambulatory Visit
Admission: RE | Admit: 2022-06-24 | Discharge: 2022-06-24 | Disposition: A | Discharge status: Home / Self Care | Payer: Medicare Other | Source: Ambulatory Visit | Attending: Interventional Radiology | Admitting: Interventional Radiology

## 2022-06-24 DIAGNOSIS — R188 Other ascites: Secondary | ICD-10-CM

## 2022-06-24 DIAGNOSIS — K7460 Unspecified cirrhosis of liver: Secondary | ICD-10-CM | POA: Diagnosis not present

## 2022-06-24 DIAGNOSIS — J90 Pleural effusion, not elsewhere classified: Secondary | ICD-10-CM | POA: Diagnosis not present

## 2022-06-24 DIAGNOSIS — K7031 Alcoholic cirrhosis of liver with ascites: Secondary | ICD-10-CM

## 2022-07-01 ENCOUNTER — Encounter: Payer: Self-pay | Admitting: Family Medicine

## 2022-07-03 ENCOUNTER — Telehealth: Payer: Self-pay | Admitting: Family Medicine

## 2022-07-03 NOTE — Telephone Encounter (Signed)
Requesting paperwork for handicapped placard

## 2022-07-07 NOTE — Telephone Encounter (Signed)
Pt would need to get papers from [facility].

## 2022-07-07 NOTE — Telephone Encounter (Signed)
Attempt to reach pt. Left a voicemail to call us back.  

## 2022-07-08 NOTE — Telephone Encounter (Signed)
Contacted patient's mobile number. Left a voicemail to call us back.

## 2022-07-09 ENCOUNTER — Telehealth: Payer: Self-pay | Admitting: Gastroenterology

## 2022-07-09 NOTE — Telephone Encounter (Signed)
Returned call to patient. Her phone goes straight to vm. Lm on vm for patient to return call. Patient has been scheduled for a follow up on Thursday, 07/31/22 at 3:40 pm.

## 2022-07-09 NOTE — Telephone Encounter (Signed)
Inbound call from patient requesting a call back to discuss if she can get a sooner appointment with Dr. Havery Moros  than March. Please advise.

## 2022-07-10 NOTE — Telephone Encounter (Signed)
Called and left patient a detailed vm with her new appt information, I also informed her that I will mail the appt information to her as well.   I left patient's daughter [name] a detailed vm with patient's appt information as well. I advised them to call if they had any questions or concerns.

## 2022-07-14 NOTE — Telephone Encounter (Signed)
Attempt to reach pt 3rd time. Left a voicemail to call us back.

## 2022-07-15 ENCOUNTER — Telehealth: Payer: Self-pay | Admitting: Family Medicine

## 2022-07-15 ENCOUNTER — Ambulatory Visit
Admission: RE | Admit: 2022-07-15 | Discharge: 2022-07-15 | Disposition: A | Discharge status: Home / Self Care | Payer: 59 | Source: Ambulatory Visit | Attending: Student | Admitting: Student

## 2022-07-15 DIAGNOSIS — Z9689 Presence of other specified functional implants: Secondary | ICD-10-CM | POA: Diagnosis not present

## 2022-07-15 NOTE — Telephone Encounter (Signed)
Handicap Placard form to be filled out--placed in dr's folder.  Call pt at [phone] upon completion.

## 2022-07-15 NOTE — Progress Notes (Signed)
Chief Complaint: Patient was consulted remotely today (TeleHealth) for EtOH cirrhosis complicated by recurrent large volume ascites  at the request of [facility].    Referring Physician(s): Matthews,Kacie Sue-Ellen  History of Present Illness: Katie Holland is a 57 y.o. female with a history of EtOH cirrhosis complicated by recurrent large-volume ascites.  She underwent TIPS creation in May 9470 which was complicated by early thrombosis necessitating TIPS revision in July 2023.  TIPS revision was successful and the large portosystemic collaterals were embolized at the time of revision.  Patient now presents 6 months later and is doing remarkably well!  Complete resolution of symptomatic ascites and hepatic hydrothorax.  She has not required an additional paracentesis.  She is doing well and taking her lactulose.  She has no other acute complaints or symptoms at this time.  She does not feel that she is experiencing any issues with encephalopathy.  Past Medical History:  Diagnosis Date   Alcoholic liver disease (HCC)    Chronic back pain    on disability   Cirrhosis of liver (HCC)    Esophageal varices (HCC)    GERD (gastroesophageal reflux disease)    Headache    HEPATIC Hydrothorax    S/P tips   Jaundice     Past Surgical History:  Procedure Laterality Date   BACK SURGERY     L3-L4   BIOPSY  08/07/2020   Procedure: BIOPSY;  Surgeon: Yetta Flock, MD;  Location: [facility];  Service: Gastroenterology;;   COLONOSCOPY WITH PROPOFOL N/A 08/01/2019   Procedure: COLONOSCOPY WITH PROPOFOL;  Surgeon: Yetta Flock, MD;  Location: [facility];  Service: Gastroenterology;  Laterality: N/A;   ESOPHAGEAL BANDING  04/25/2018   Procedure: ESOPHAGEAL BANDING;  Surgeon: Yetta Flock, MD;  Location: [facility] ENDOSCOPY;  Service: Gastroenterology;;   ESOPHAGEAL BANDING  04/27/2018   Procedure: ESOPHAGEAL BANDING;  Surgeon: Milus Banister, MD;  Location: [place];  Service: Endoscopy;;   ESOPHAGEAL BANDING  06/02/2018   Procedure: ESOPHAGEAL BANDING;  Surgeon: Doran Stabler, MD;  Location: [place];  Service: Gastroenterology;;   ESOPHAGEAL BANDING N/A 07/07/2018   Procedure: ESOPHAGEAL BANDING;  Surgeon: Yetta Flock, MD;  Location: [facility];  Service: Gastroenterology;  Laterality: N/A;   ESOPHAGEAL BANDING  07/27/2018   Procedure: ESOPHAGEAL BANDING;  Surgeon: Yetta Flock, MD;  Location: [facility];  Service: Gastroenterology;;   ESOPHAGEAL BANDING  08/30/2018   Procedure: ESOPHAGEAL BANDING;  Surgeon: Yetta Flock, MD;  Location: [facility];  Service: Gastroenterology;;   ESOPHAGOGASTRODUODENOSCOPY N/A 04/27/2018   Procedure: ESOPHAGOGASTRODUODENOSCOPY (EGD);  Surgeon: Milus Banister, MD;  Location: [facility] ENDOSCOPY;  Service: Endoscopy;  Laterality: N/A;   ESOPHAGOGASTRODUODENOSCOPY (EGD) WITH PROPOFOL N/A 04/25/2018   Procedure: ESOPHAGOGASTRODUODENOSCOPY (EGD) WITH PROPOFOL;  Surgeon: Yetta Flock, MD;  Location: [place];  Service: Gastroenterology;  Laterality: N/A;   ESOPHAGOGASTRODUODENOSCOPY (EGD) WITH PROPOFOL N/A 06/02/2018   Procedure: ESOPHAGOGASTRODUODENOSCOPY (EGD) WITH PROPOFOL;  Surgeon: Doran Stabler, MD;  Location: [place];  Service: Gastroenterology;  Laterality: N/A;   ESOPHAGOGASTRODUODENOSCOPY (EGD) WITH PROPOFOL N/A 07/07/2018   Procedure: ESOPHAGOGASTRODUODENOSCOPY (EGD) WITH PROPOFOL;  Surgeon: Yetta Flock, MD;  Location: [facility];  Service: Gastroenterology;  Laterality: N/A;   ESOPHAGOGASTRODUODENOSCOPY (EGD) WITH PROPOFOL N/A 07/27/2018   Procedure: ESOPHAGOGASTRODUODENOSCOPY (EGD) WITH PROPOFOL;  Surgeon: Yetta Flock, MD;  Location: [facility];  Service: Gastroenterology;  Laterality: N/A;   ESOPHAGOGASTRODUODENOSCOPY (EGD) WITH PROPOFOL N/A 08/30/2018   Procedure: ESOPHAGOGASTRODUODENOSCOPY (EGD)  WITH PROPOFOL;  Surgeon: Yetta Flock, MD;  Location: [facility];  Service: Gastroenterology;  Laterality: N/A;   ESOPHAGOGASTRODUODENOSCOPY (EGD) WITH PROPOFOL N/A 11/08/2018   Procedure: ESOPHAGOGASTRODUODENOSCOPY (EGD) WITH PROPOFOL;  Surgeon: Yetta Flock, MD;  Location: [facility];  Service: Gastroenterology;  Laterality: N/A;   ESOPHAGOGASTRODUODENOSCOPY (EGD) WITH PROPOFOL N/A 01/31/2019   Procedure: ESOPHAGOGASTRODUODENOSCOPY (EGD) WITH PROPOFOL;  Surgeon: Yetta Flock, MD;  Location: [facility];  Service: Gastroenterology;  Laterality: N/A;   ESOPHAGOGASTRODUODENOSCOPY (EGD) WITH PROPOFOL N/A 08/01/2019   Procedure: ESOPHAGOGASTRODUODENOSCOPY (EGD) WITH PROPOFOL;  Surgeon: Yetta Flock, MD;  Location: [facility];  Service: Gastroenterology;  Laterality: N/A;   ESOPHAGOGASTRODUODENOSCOPY (EGD) WITH PROPOFOL N/A 08/07/2020   Procedure: ESOPHAGOGASTRODUODENOSCOPY (EGD) WITH PROPOFOL;  Surgeon: Yetta Flock, MD;  Location: [facility];  Service: Gastroenterology;  Laterality: N/A;   ESOPHAGOGASTRODUODENOSCOPY (EGD) WITH PROPOFOL N/A 08/22/2021   Procedure: ESOPHAGOGASTRODUODENOSCOPY (EGD) WITH PROPOFOL;  Surgeon: Yetta Flock, MD;  Location: [facility];  Service: Gastroenterology;  Laterality: N/A;   IR EMBO ART  VEN HEMORR LYMPH EXTRAV  INC GUIDE ROADMAPPING  01/02/2022   IR INTRAVASCULAR ULTRASOUND NON CORONARY  11/05/2021   IR PARACENTESIS  04/26/2018   IR PARACENTESIS  05/18/2018   IR PARACENTESIS  05/31/2018   IR PARACENTESIS  06/30/2018   IR PARACENTESIS  08/05/2018   IR PARACENTESIS  08/31/2018   IR PARACENTESIS  10/08/2018   IR PARACENTESIS  10/28/2018   IR PARACENTESIS  02/14/2019   IR PARACENTESIS  03/09/2019   IR PARACENTESIS  04/05/2019   IR PARACENTESIS  05/03/2019   IR PARACENTESIS  06/20/2019   IR PARACENTESIS  07/19/2019   IR PARACENTESIS  08/19/2019   IR PARACENTESIS  09/27/2019   IR PARACENTESIS  10/07/2019   IR PARACENTESIS  10/28/2019   IR PARACENTESIS  11/16/2019   IR  PARACENTESIS  12/30/2019   IR PARACENTESIS  01/16/2020   IR PARACENTESIS  02/07/2020   IR PARACENTESIS  03/06/2020   IR PARACENTESIS  04/04/2020   IR PARACENTESIS  04/17/2020   IR PARACENTESIS  05/04/2020   IR PARACENTESIS  06/07/2020   IR PARACENTESIS  06/28/2020   IR PARACENTESIS  07/27/2020   IR PARACENTESIS  08/20/2020   IR PARACENTESIS  09/13/2020   IR PARACENTESIS  10/04/2020   IR PARACENTESIS  10/25/2020   IR PARACENTESIS  11/07/2020   IR PARACENTESIS  11/30/2020   IR PARACENTESIS  12/13/2020   IR PARACENTESIS  01/04/2021   IR PARACENTESIS  01/25/2021   IR PARACENTESIS  02/20/2021   IR PARACENTESIS  03/20/2021   IR PARACENTESIS  04/18/2021   IR PARACENTESIS  05/14/2021   IR PARACENTESIS  06/10/2021   IR PARACENTESIS  06/27/2021   IR PARACENTESIS  07/17/2021   IR PARACENTESIS  08/12/2021   IR PARACENTESIS  08/23/2021   IR PARACENTESIS  09/13/2021   IR PARACENTESIS  10/02/2021   IR PARACENTESIS  10/22/2021   IR PARACENTESIS  11/01/2021   IR PARACENTESIS  11/29/2021   IR PARACENTESIS  12/18/2021   IR PARACENTESIS  01/02/2022   IR RADIOLOGIST EVAL & MGMT  12/23/2018   IR RADIOLOGIST EVAL & MGMT  10/11/2021   IR RADIOLOGIST EVAL & MGMT  12/27/2021   IR THORACENTESIS ASP PLEURAL SPACE W/IMG GUIDE  12/10/2018   IR THORACENTESIS ASP PLEURAL SPACE W/IMG GUIDE  01/21/2019   IR THORACENTESIS ASP PLEURAL SPACE W/IMG GUIDE  10/23/2021   IR THORACENTESIS ASP PLEURAL SPACE W/IMG GUIDE  10/25/2021   IR THORACENTESIS ASP  PLEURAL SPACE W/IMG GUIDE  11/01/2021   IR THORACENTESIS ASP PLEURAL SPACE W/IMG GUIDE  01/03/2022   IR THROMBECT VENO MECH MOD SED  01/02/2022   IR TIPS  11/01/2021   IR TIPS REVISION MOD SED  01/02/2022   IR TRANSCATH RETRIEVAL FB INCL GUIDANCE (MS)  01/02/2022   IR US GUIDE VASC ACCESS RIGHT  11/01/2021   IR US GUIDE VASC ACCESS RIGHT  11/01/2021   IR US GUIDE VASC ACCESS RIGHT  11/01/2021   IR US GUIDE VASC ACCESS RIGHT  01/02/2022   RADIOLOGY WITH ANESTHESIA N/A 11/01/2021   Procedure: TIPS;  Surgeon:  Criselda Peaches, MD;  Location: [place];  Service: Radiology;  Laterality: N/A;    Allergies: Contrast media [iodinated contrast media], Latex, and Sulfa antibiotics  Medications: Prior to Admission medications   Medication Sig Start Date End Date Taking? Authorizing Provider  acetaminophen (TYLENOL) 500 MG tablet Take 500-1,000 mg by mouth daily as needed for mild pain, headache or fever. Patient not taking: Reported on 06/09/2022    [provider]  folic acid (FOLVITE) 1 MG tablet Take 1 tablet (1 mg total) by mouth daily. Patient not taking: Reported on 06/09/2022 04/25/22   Aline August, MD  lactulose (CHRONULAC) 10 GM/15ML solution Take 45 mLs (30 g total) by mouth 2 (two) times daily. 06/09/22   Billie Ruddy, MD  Multiple Vitamin (MULTIVITAMIN WITH MINERALS) TABS tablet Take 1 tablet by mouth daily. 04/25/22   Aline August, MD  thiamine (VITAMIN B-1) 100 MG tablet Take 1 tablet (100 mg total) by mouth daily. 04/25/22   Aline August, MD     Family History  Problem Relation Age of Onset   Colon cancer Neg Hx    Esophageal cancer Neg Hx    Pancreatic cancer Neg Hx    Stomach cancer Neg Hx     Social History   Socioeconomic History   Marital status: Married    Spouse name: Not on file   Number of children: 8   Years of education: Not on file   Highest education level: Not on file  Occupational History   Occupation: disability  Tobacco Use   Smoking status: Some Days    Packs/day: 0.25    Years: 20.00    Total pack years: 5.00    Types: Cigarettes   Smokeless tobacco: Never   Tobacco comments:    5 a day  Vaping Use   Vaping Use: Former  Substance and Sexual Activity   Alcohol use: Not Currently   Drug use: Not Currently   Sexual activity: Not Currently  Other Topics Concern   Not on file  Social History Narrative   Not on file   Social Determinants of Health   Financial Resource Strain: High Risk (11/22/2021)   Overall Financial Resource  Strain (CARDIA)    Difficulty of Paying Living Expenses: Hard  Food Insecurity: No Food Insecurity (11/19/2021)   Hunger Vital Sign    Worried About Running Out of Food in the Last Year: Never true    Ran Out of Food in the Last Year: Never true  Transportation Needs: No Transportation Needs (11/19/2021)   PRAPARE - Hydrologist (Medical): No    Lack of Transportation (Non-Medical): No  Physical Activity: Insufficiently Active (11/19/2021)   Exercise Vital Sign    Days of Exercise per Week: 2 days    Minutes of Exercise per Session: 20 min  Stress: No Stress Concern Present (  12/18/2020)   [place]    Feeling of Stress : Not at all  Social Connections: Socially Isolated (11/19/2021)   Social Connection and Isolation Panel [NHANES]    Frequency of Communication with Friends and Family: Three times a week    Frequency of Social Gatherings with Friends and Family: Twice a week    Attends Religious Services: Never    Marine scientist or Organizations: No    Attends Music therapist: Never    Marital Status: Divorced    Review of Systems  Review of Systems: A 12 point ROS discussed and pertinent positives are indicated in the HPI above.  All other systems are negative.    Physical Exam No direct physical exam was performed (except for noted visual exam findings with Video Visits).    Vital Signs: There were no vitals taken for this visit.  Imaging: US ABDOMINAL PELVIC ART/VENT FLOW DOPPLER  Result Date: 06/25/2022 CLINICAL DATA:  57 year old female with a history of cirrhosis, prior tips, prior tips revision and embolization variceal network EXAM: DUPLEX ULTRASOUND OF LIVER AND TIPS SHUNT TECHNIQUE: Color and duplex Doppler ultrasound was performed to evaluate the hepatic in-flow and out-flow vessels. COMPARISON:  12/27/2021 FINDINGS: Portal Vein Velocities Main:  102  cm/sec Right:  55 cm/sec Left:  25 cm/sec TIPS Stent Velocities Proximal:  102 cm/sec Mid:  68 Distal:  74 cm/sec Hepatic Vein Velocities Right:  18 cm/sec Mid:  27 cm/sec Left:  18 cm/sec Splenic Vein: 149 cm per second Superior Mesenteric Vein: 132 cm per second Hepatic Artery: 148 cm per second Ascities: Scant ascites Varices: Not documented Other findings: Right-sided pleural effusion Incidental imaging of cholelithiasis. Sonographic Percell Miller sign was not documented. Cirrhotic contour of the liver IMPRESSION: Directed duplex of the hepatic vasculature demonstrates patent tips and portal venous system. Right-sided pleural effusion, with scant ascites. Signed, Dulcy Fanny. Nadene Rubins, RPVI [facility] Radiology Electronically Signed   By: Corrie Mckusick D.O.   On: 06/25/2022 08:26    Labs:  CBC: Recent Labs    01/02/22 0805 04/23/22 1408 04/23/22 1610 04/24/22 0321 06/09/22 1526  WBC 4.0  --  2.7* 3.2* 4.4  HGB 10.5* 11.6* 9.9* 10.5* 12.1  HCT 34.7* 34.0* 33.0* 33.8* 38.2  PLT 197  --  130* 157 219.0 Repeated and verified X2.    COAGS: Recent Labs    11/01/21 0538 11/02/21 0152 12/12/21 1623 01/02/22 0805  INR 1.1 1.5* 1.3* 1.3*    BMP: Recent Labs    11/06/21 0050 11/15/21 1507 01/02/22 0805 04/23/22 1355 04/23/22 1408 04/24/22 0321 06/09/22 1526  NA 130*   < > 137 143 144 143 137  K 4.5   < > 3.7 3.6 3.4* 3.2* 3.6  CL 97*   < > 104 112* 110 114* 106  CO2 27   < > 22 20*  --  20* 24  GLUCOSE 109*   < > 181* 82 80 95 70  BUN 9   < > '11 8 8 7 10  '$ CALCIUM 8.5*   < > 8.6* 9.1  --  9.7 9.8  CREATININE 0.58   < > 0.85 0.66 0.50 0.69 0.65  GFRNONAA >60  --  >60 >60  --  >60  --    < > = values in this interval not displayed.    LIVER FUNCTION TESTS: Recent Labs    01/02/22 0805 04/23/22 1355  04/24/22 0321 06/09/22 1526  BILITOT 1.4* 0.9 1.5* 1.1  AST 36 38 33 28  ALT '22 14 16 14  '$ ALKPHOS 132* 117 111 155*  PROT  6.7 6.2* 6.3* 7.3  ALBUMIN 2.8* 2.6* 2.7* 3.5    TUMOR MARKERS: Recent Labs    08/05/21 0935  AFPTM 1.4    Assessment and Plan:  Very pleasant 57 year old female with a history of EtOH cirrhosis complicated by recurrent large-volume ascites.  She underwent TIPS creation in May 8938 which was complicated by early thrombosis necessitating TIPS revision in July 2023.  TIPS revision was successful and the large portosystemic collaterals were embolized at the time of revision.  Patient now presents 6 months later and is doing remarkably well!  Complete resolution of symptomatic ascites and hepatic hydrothorax.  She has not required an additional paracentesis.  She is doing well and taking her lactulose.  1.) TIPS duplex US and clinic visit with liver labs in 6 months (July 2024)   Electronically Signed: Criselda Peaches 07/15/2022, 12:02 PM   I spent a total of 15 Minutes in remote  clinical consultation, greater than 50% of which was counseling/coordinating care for EtOH cirrhosis complicated by recurrent large volume ascites .    Visit type: Audio only (telephone). Audio (no video) only due to patient preference. Alternative for in-person consultation at [facility], [place] [address] [place], [place], [place]. This visit type was conducted due to national recommendations for restrictions regarding the COVID-19 Pandemic (e.g. social distancing).  This format is felt to be most appropriate for this patient at this time.  All issues noted in this document were discussed and addressed.

## 2022-07-18 NOTE — Telephone Encounter (Signed)
Dr. Volanda Napoleon responded " per criteria listed. Pt doesn't qualify for handicap placard" to handicap form.   Attempt to reach pt. Left a voicemail to call us back.

## 2022-07-23 NOTE — Telephone Encounter (Signed)
Attempted to reach pt. Left a voicemail to call us back.

## 2022-07-29 NOTE — Telephone Encounter (Signed)
Third attempt to reach pt. Left a detail message and for pt to call us back if have question.

## 2022-07-31 ENCOUNTER — Encounter: Payer: Self-pay | Admitting: Gastroenterology

## 2022-07-31 ENCOUNTER — Ambulatory Visit (INDEPENDENT_AMBULATORY_CARE_PROVIDER_SITE_OTHER): Payer: 59 | Admitting: Gastroenterology

## 2022-07-31 VITALS — BP 142/84 | HR 79 | Ht 64.0 in | Wt 141.5 lb

## 2022-07-31 DIAGNOSIS — K7031 Alcoholic cirrhosis of liver with ascites: Secondary | ICD-10-CM

## 2022-07-31 DIAGNOSIS — K7682 Hepatic encephalopathy: Secondary | ICD-10-CM

## 2022-07-31 DIAGNOSIS — Z95828 Presence of other vascular implants and grafts: Secondary | ICD-10-CM

## 2022-07-31 MED ORDER — RIFAXIMIN 550 MG PO TABS: 550.0000 mg | ORAL_TABLET | Freq: Two times a day (BID) | ORAL | 1 refills | Status: DC

## 2022-07-31 NOTE — Patient Instructions (Addendum)
If you are age 58 or older, your body mass index should be between 23-30. Your Body mass index is 24.29 kg/m. If this is out of the aforementioned range listed, please consider follow up with your Primary Care Provider.  If you are age 27 or younger, your body mass index should be between 19-25. Your Body mass index is 24.29 kg/m. If this is out of the aformentioned range listed, please consider follow up with your Primary Care Provider.   ________________________________________________________  We have sent the following medications to your pharmacy for you to pick up at your convenience: Xifaxan 550 mg: Take twice daily  You will be due for labs in June and a liver ultrasound in July. We will remind you when it is time.  Please follow up in 6 months.  Thank you for entrusting me with your care and for choosing [facility], Dr. Biddle Cellar

## 2022-07-31 NOTE — Progress Notes (Signed)
HPI :  57 y/o female here for a follow up visit for decompensated alcoholic cirrhosis, accompanied by her husband today. Recall she has a history of decompensated cirrhosis secondary to alcohol - course was complicated by significant esophageal varices leading to bleeding in the past.  She has had a few admissions at the end of 2019 and required over 25 bands to eradicate varices.  Her course is also been complicated by significant recurrent ascites and hepatic hydrothorax. Recall she has not been on nonselective beta-blockade in the past due to refractory ascites.  She was unfortunately noncompliant with diuretics, ultimately got admitted with refractory hepatic hydrothorax and respiratory distress.    Eventually underwent TIPS per IR in May 4174 which was complicated by early thrombosis necessitating TIPS revision in July 2023. TIPS revision was successful and the large portosystemic collaterals were embolized at the time of revision.   I have not seen her since June.  She has been doing remarkably well since this time.  Her ascites has clinically resolved.  She has not required any further paracentesis.  Further her breathing has been better and she has not required any further thoracentesis.  She is off all diuretics.  Only issue has been some hepatic encephalopathy.  She has been taking lactulose 3 times daily which seems to control her symptoms.  She does not like taking this as it upsets her stomach, she has 1-2 bowel movements daily on the regimen.  Husband states he can tell when she does not take it, her personality changes somewhat.  Overall no hospitalizations for this however and she is managing okay.  She is well-appearing today, clinically appears significantly improved since have last seen her.  Recall alcohol abstinence has been very difficult for her in the past.  She is adamant she has not had any alcohol since last summer since she was dealing with hepatic hydrothorax and has been  doing a very good job with this.  Overall she is significantly improved.  She had a Doppler ultrasound of her liver in January which showed a patent TIPS.  Her labs from December are stable.  MELD 3.0: 13 at 01/02/2022  8:05 AM MELD-Na: 11 at 01/02/2022  8:05 AM Calculated from: Serum Creatinine: 0.85 mg/dL (Using min of 1 mg/dL) at 01/02/2022  8:05 AM Serum Sodium: 137 mmol/L at 01/02/2022  8:05 AM Total Bilirubin: 1.4 mg/dL at 01/02/2022  8:05 AM Serum Albumin: 2.8 g/dL at 01/02/2022  8:05 AM INR(ratio): 1.3 at 01/02/2022  8:05 AM Age at listing (hypothetical): 76 years Sex: Female at 01/02/2022  8:05 AM  Most recent endoscopies: EGD 07/07/18 - 4 bands placed EGD 07/27/18 - 3 bands placed EGD 08/30/18 - 3 bands placed  EGD 11/08/18 -no varices amenable to banding, portal hypertensive gastritis EGD 01/31/19 - portal hypertensive gastritis, no varices   EGD 08/01/19 - There was extensive scarring in the distal esophagus secondary to prior banding, but no obvious varices noted today. The exam of the esophagus was otherwise normal. Diffuse severely congested mucosa was found in the gastric antrum secondary to portal hypertension.. The exam of the stomach was otherwise normal. No gastric varices. The duodenal bulb and second portion of the duodenum were normal.     Colonoscopy 08/01/19 - Preparation of the colon was fair - most of the colon was well visualized - a small portion of the transverse colon, and then the splenic flexure / proximal descending colon had some residual stool that could not be cleared but  nothing obvious noted in these areas. - Hemorrhoids found on perianal exam. - The examined portion of the ileum was normal. - Diffuse colonic congestion / edema in the entire examined colon from portal hypertension. - Internal hemorrhoids. - The examination was otherwise normal.     EGD 08/07/20: Esophagogastric landmarks identified. - Normal esophagus otherwise - scarring from prior banding  noted but appreciable varices. - Antral gastritis with an erosion. - Normal stomach otherwise - biopsies taken to rule out H pylori - A single duodenal polyp. Resected and retrieved. - Normal duodenum otherwise      EGD 08/22/21: Esophagogastric landmarks identified. - Trace esophageal varices in the lower esophagus without any high risk stigmata. - Multiple areas of scarring from prior banding - A large amount of food (residue) in the stomach, exam of the stomach and duodenum not performed once this was seen and procedure stopped.   [place] RUQ with doppler 06/25/22: IMPRESSION: Directed duplex of the hepatic vasculature demonstrates patent tips and portal venous system. Right-sided pleural effusion, with scant ascites.   Past Medical History:  Diagnosis Date   Alcoholic liver disease (HCC)    Chronic back pain    on disability   Cirrhosis of liver (HCC)    Esophageal varices (HCC)    GERD (gastroesophageal reflux disease)    Headache    HEPATIC Hydrothorax    S/P tips   Jaundice      Past Surgical History:  Procedure Laterality Date   BACK SURGERY     L3-L4   BIOPSY  08/07/2020   Procedure: BIOPSY;  Surgeon: Yetta Flock, MD;  Location: [facility];  Service: Gastroenterology;;   COLONOSCOPY WITH PROPOFOL N/A 08/01/2019   Procedure: COLONOSCOPY WITH PROPOFOL;  Surgeon: Yetta Flock, MD;  Location: [facility];  Service: Gastroenterology;  Laterality: N/A;   ESOPHAGEAL BANDING  04/25/2018   Procedure: ESOPHAGEAL BANDING;  Surgeon: Yetta Flock, MD;  Location: [facility]COPY;  Service: Gastroenterology;;   ESOPHAGEAL BANDING  04/27/2018   Procedure: ESOPHAGEAL BANDING;  Surgeon: Milus Banister, MD;  Location: [place];  Service: Endoscopy;;   ESOPHAGEAL BANDING  06/02/2018   Procedure: ESOPHAGEAL BANDING;  Surgeon: Doran Stabler, MD;  Location: [place];  Service: Gastroenterology;;   ESOPHAGEAL BANDING N/A 07/07/2018   Procedure: ESOPHAGEAL  BANDING;  Surgeon: Yetta Flock, MD;  Location: [facility];  Service: Gastroenterology;  Laterality: N/A;   ESOPHAGEAL BANDING  07/27/2018   Procedure: ESOPHAGEAL BANDING;  Surgeon: Yetta Flock, MD;  Location: [facility];  Service: Gastroenterology;;   ESOPHAGEAL BANDING  08/30/2018   Procedure: ESOPHAGEAL BANDING;  Surgeon: Yetta Flock, MD;  Location: [facility];  Service: Gastroenterology;;   ESOPHAGOGASTRODUODENOSCOPY N/A 04/27/2018   Procedure: ESOPHAGOGASTRODUODENOSCOPY (EGD);  Surgeon: Milus Banister, MD;  Location: [facility] ENDOSCOPY;  Service: Endoscopy;  Laterality: N/A;   ESOPHAGOGASTRODUODENOSCOPY (EGD) WITH PROPOFOL N/A 04/25/2018   Procedure: ESOPHAGOGASTRODUODENOSCOPY (EGD) WITH PROPOFOL;  Surgeon: Yetta Flock, MD;  Location: [place];  Service: Gastroenterology;  Laterality: N/A;   ESOPHAGOGASTRODUODENOSCOPY (EGD) WITH PROPOFOL N/A 06/02/2018   Procedure: ESOPHAGOGASTRODUODENOSCOPY (EGD) WITH PROPOFOL;  Surgeon: Doran Stabler, MD;  Location: [place];  Service: Gastroenterology;  Laterality: N/A;   ESOPHAGOGASTRODUODENOSCOPY (EGD) WITH PROPOFOL N/A 07/07/2018   Procedure: ESOPHAGOGASTRODUODENOSCOPY (EGD) WITH PROPOFOL;  Surgeon: Yetta Flock, MD;  Location: [facility];  Service: Gastroenterology;  Laterality: N/A;   ESOPHAGOGASTRODUODENOSCOPY (EGD) WITH PROPOFOL N/A 07/27/2018   Procedure: ESOPHAGOGASTRODUODENOSCOPY (EGD) WITH PROPOFOL;  Surgeon: Havery Moros,  Carlota Raspberry, MD;  Location: [facility];  Service: Gastroenterology;  Laterality: N/A;   ESOPHAGOGASTRODUODENOSCOPY (EGD) WITH PROPOFOL N/A 08/30/2018   Procedure: ESOPHAGOGASTRODUODENOSCOPY (EGD) WITH PROPOFOL;  Surgeon: Yetta Flock, MD;  Location: [facility];  Service: Gastroenterology;  Laterality: N/A;   ESOPHAGOGASTRODUODENOSCOPY (EGD) WITH PROPOFOL N/A 11/08/2018   Procedure: ESOPHAGOGASTRODUODENOSCOPY (EGD) WITH PROPOFOL;  Surgeon: Yetta Flock, MD;   Location: [facility];  Service: Gastroenterology;  Laterality: N/A;   ESOPHAGOGASTRODUODENOSCOPY (EGD) WITH PROPOFOL N/A 01/31/2019   Procedure: ESOPHAGOGASTRODUODENOSCOPY (EGD) WITH PROPOFOL;  Surgeon: Yetta Flock, MD;  Location: [facility];  Service: Gastroenterology;  Laterality: N/A;   ESOPHAGOGASTRODUODENOSCOPY (EGD) WITH PROPOFOL N/A 08/01/2019   Procedure: ESOPHAGOGASTRODUODENOSCOPY (EGD) WITH PROPOFOL;  Surgeon: Yetta Flock, MD;  Location: [facility];  Service: Gastroenterology;  Laterality: N/A;   ESOPHAGOGASTRODUODENOSCOPY (EGD) WITH PROPOFOL N/A 08/07/2020   Procedure: ESOPHAGOGASTRODUODENOSCOPY (EGD) WITH PROPOFOL;  Surgeon: Yetta Flock, MD;  Location: [facility];  Service: Gastroenterology;  Laterality: N/A;   ESOPHAGOGASTRODUODENOSCOPY (EGD) WITH PROPOFOL N/A 08/22/2021   Procedure: ESOPHAGOGASTRODUODENOSCOPY (EGD) WITH PROPOFOL;  Surgeon: Yetta Flock, MD;  Location: [facility];  Service: Gastroenterology;  Laterality: N/A;   IR EMBO ART  VEN HEMORR LYMPH EXTRAV  INC GUIDE ROADMAPPING  01/02/2022   IR INTRAVASCULAR ULTRASOUND NON CORONARY  11/05/2021   IR PARACENTESIS  04/26/2018   IR PARACENTESIS  05/18/2018   IR PARACENTESIS  05/31/2018   IR PARACENTESIS  06/30/2018   IR PARACENTESIS  08/05/2018   IR PARACENTESIS  08/31/2018   IR PARACENTESIS  10/08/2018   IR PARACENTESIS  10/28/2018   IR PARACENTESIS  02/14/2019   IR PARACENTESIS  03/09/2019   IR PARACENTESIS  04/05/2019   IR PARACENTESIS  05/03/2019   IR PARACENTESIS  06/20/2019   IR PARACENTESIS  07/19/2019   IR PARACENTESIS  08/19/2019   IR PARACENTESIS  09/27/2019   IR PARACENTESIS  10/07/2019   IR PARACENTESIS  10/28/2019   IR PARACENTESIS  11/16/2019   IR PARACENTESIS  12/30/2019   IR PARACENTESIS  01/16/2020   IR PARACENTESIS  02/07/2020   IR PARACENTESIS  03/06/2020   IR PARACENTESIS  04/04/2020   IR PARACENTESIS  04/17/2020   IR PARACENTESIS  05/04/2020   IR PARACENTESIS  06/07/2020   IR  PARACENTESIS  06/28/2020   IR PARACENTESIS  07/27/2020   IR PARACENTESIS  08/20/2020   IR PARACENTESIS  09/13/2020   IR PARACENTESIS  10/04/2020   IR PARACENTESIS  10/25/2020   IR PARACENTESIS  11/07/2020   IR PARACENTESIS  11/30/2020   IR PARACENTESIS  12/13/2020   IR PARACENTESIS  01/04/2021   IR PARACENTESIS  01/25/2021   IR PARACENTESIS  02/20/2021   IR PARACENTESIS  03/20/2021   IR PARACENTESIS  04/18/2021   IR PARACENTESIS  05/14/2021   IR PARACENTESIS  06/10/2021   IR PARACENTESIS  06/27/2021   IR PARACENTESIS  07/17/2021   IR PARACENTESIS  08/12/2021   IR PARACENTESIS  08/23/2021   IR PARACENTESIS  09/13/2021   IR PARACENTESIS  10/02/2021   IR PARACENTESIS  10/22/2021   IR PARACENTESIS  11/01/2021   IR PARACENTESIS  11/29/2021   IR PARACENTESIS  12/18/2021   IR PARACENTESIS  01/02/2022   IR RADIOLOGIST EVAL & MGMT  12/23/2018   IR RADIOLOGIST EVAL & MGMT  10/11/2021   IR RADIOLOGIST EVAL & MGMT  12/27/2021   IR THORACENTESIS ASP PLEURAL SPACE W/IMG GUIDE  12/10/2018   IR THORACENTESIS ASP PLEURAL SPACE W/IMG GUIDE  01/21/2019  IR THORACENTESIS ASP PLEURAL SPACE W/IMG GUIDE  10/23/2021   IR THORACENTESIS ASP PLEURAL SPACE W/IMG GUIDE  10/25/2021   IR THORACENTESIS ASP PLEURAL SPACE W/IMG GUIDE  11/01/2021   IR THORACENTESIS ASP PLEURAL SPACE W/IMG GUIDE  01/03/2022   IR THROMBECT VENO MECH MOD SED  01/02/2022   IR TIPS  11/01/2021   IR TIPS REVISION MOD SED  01/02/2022   IR TRANSCATH RETRIEVAL FB INCL GUIDANCE (MS)  01/02/2022   IR US GUIDE VASC ACCESS RIGHT  11/01/2021   IR US GUIDE VASC ACCESS RIGHT  11/01/2021   IR US GUIDE VASC ACCESS RIGHT  11/01/2021   IR US GUIDE VASC ACCESS RIGHT  01/02/2022   RADIOLOGY WITH ANESTHESIA N/A 11/01/2021   Procedure: TIPS;  Surgeon: Criselda Peaches, MD;  Location: [place];  Service: Radiology;  Laterality: N/A;   Family History  Problem Relation Age of Onset   Colon cancer Neg Hx    Esophageal cancer Neg Hx    Pancreatic cancer Neg Hx    Stomach cancer Neg Hx    Social  History   Tobacco Use   Smoking status: Some Days    Packs/day: 0.25    Years: 20.00    Total pack years: 5.00    Types: Cigarettes   Smokeless tobacco: Never   Tobacco comments:    5 a day  Vaping Use   Vaping Use: Former  Substance Use Topics   Alcohol use: Not Currently   Drug use: Not Currently   Current Outpatient Medications  Medication Sig Dispense Refill   acetaminophen (TYLENOL) 500 MG tablet Take 500-1,000 mg by mouth daily as needed for mild pain, headache or fever.     lactulose (CHRONULAC) 10 GM/15ML solution Take 45 mLs (30 g total) by mouth 2 (two) times daily. 1892 mL 12   Multiple Vitamin (MULTIVITAMIN WITH MINERALS) TABS tablet Take 1 tablet by mouth daily.     thiamine (VITAMIN B-1) 100 MG tablet Take 1 tablet (100 mg total) by mouth daily. 30 tablet 0   No current facility-administered medications for this visit.   Facility-Administered Medications Ordered in Other Visits  Medication Dose Route Frequency Provider Last Rate Last Admin   lactated ringers infusion    Continuous PRN Leonor Liv, CRNA   New Bag at 03/09/19 0800   lactated ringers infusion    Continuous PRN Leonor Liv, CRNA   New Bag at 03/09/19 0981   Allergies  Allergen Reactions   Contrast Media [Iodinated Contrast Media] Itching and Swelling    Needs 13-hr prep   Latex Itching, Swelling and Other (See Comments)   Sulfa Antibiotics Itching, Swelling and Other (See Comments)    No breathing impairment     Review of Systems: All systems reviewed and negative except where noted in HPI.   Lab Results  Component Value Date   WBC 4.4 06/09/2022   HGB 12.1 06/09/2022   HCT 38.2 06/09/2022   MCV 75.5 (L) 06/09/2022   PLT 219.0 Repeated and verified X2. 06/09/2022    Lab Results  Component Value Date   CREATININE 0.65 06/09/2022   BUN 10 06/09/2022   NA 137 06/09/2022   K 3.6 06/09/2022   CL 106 06/09/2022   CO2 24 06/09/2022    Lab Results  Component Value Date   ALT 14  06/09/2022   AST 28 06/09/2022   ALKPHOS 155 (H) 06/09/2022   BILITOT 1.1 06/09/2022    Lab Results  Component Value  Date   INR 1.3 (H) 01/02/2022   INR 1.3 (H) 12/12/2021   INR 1.5 (H) 11/02/2021     Physical Exam: BP (!) 142/84   Pulse 79   Ht '5\' 4"'$  (1.626 m)   Wt 141 lb 8 oz (64.2 kg)   SpO2 97%   BMI 24.29 kg/m  Constitutional: Pleasant,well-developed, female in no acute distress. Abdominal: Soft, nondistended, nontender.  There are no masses palpable.  Extremities: no edema Neurological: Alert and oriented to person place and time. Psychiatric: Normal mood and affect. Behavior is normal.   ASSESSMENT: 57 y.o. female here for assessment of the following  1. Alcoholic cirrhosis of liver with ascites ([place])   2. S/P TIPS (transjugular intrahepatic portosystemic shunt)   3. Hepatic encephalopathy ([place])    She previously was doing quite poorly with large esophageal varices, history of bleeding, refractory ascites, hepatic hydrothorax, noncompliant with diuretics and alcohol abstinence.  She has made a remarkable improvement since have last seen her. Now s/p TIPS May 0160 which was complicated by early thrombosis necessitating TIPS revision in July 2023. TIPS revision was successful and the large portosystemic collaterals were embolized at the time of revision.   Clinically the ascites and hepatic hydrothorax has resolved, she is off diuretics.  Hepatic encephalopathy is her main issue but it appears controlled on lactulose.  She does not like the lactulose however it is helping her.  We discussed the importance of this.  I will try to order her rifaximin if covered by her insurance to see if we could slightly lower her lactulose dosing, unclear if this will be covered for her or not.  She wishes to try it if possible.  Otherwise, she is doing an excellent job with alcohol abstinence and congratulated her on this.  This will really help her liver function moving forward.  Plan  at this time is to see if we can get her on rifaximin, otherwise continue lactulose.  She is due for labs in June and another ultrasound in July.  She will see me again in 6 months for reassessment.  PLAN: - rifaximin '550mg'$  BID - hopefully if she can get this may be able to reduce dose of lactulose - continue lactulose as currently scheduled. If she can get Rifaximin may be able to dose reduce slightly - labs in June - CBC, CMET, INR, AFP - recall RUQ [place] in July - f/u 6 months  Jolly Mango, MD [facility]

## 2022-08-04 ENCOUNTER — Ambulatory Visit: Payer: 59 | Admitting: Family Medicine

## 2022-08-11 ENCOUNTER — Telehealth: Payer: Self-pay

## 2022-08-11 NOTE — Telephone Encounter (Signed)
Per Rose Fillers PA has been approved effective 08-08-22 thru 06-23-23. [id number]

## 2022-08-25 ENCOUNTER — Telehealth: Payer: Self-pay

## 2022-08-25 NOTE — Telephone Encounter (Signed)
Called [facility] re: Xifaxan. They have reached out to patient multiple times but have been unable to reach her. They have called and texted her. Xifaxan, for her hepatic encephalopathy, was approved with a $0 copay but she must call them [phone]) before they can fill the script and send her the medication. I Called the patient. She answered but then the phone went dead. Called and spoke to her husband, Joneen Boers.he said to call back and leave the info on his voice mail but his VM had not been set up so I couldn't leave a message. I called [facility] again and left a detailed message and asked her to call [facility] to arrange for Xifaxan to be shipped to her at no charge. [phone]

## 2022-08-26 NOTE — Telephone Encounter (Signed)
Rec'd fax from [facility] that Katie Holland has been shipped to patient on 08-26-22.

## 2022-09-09 ENCOUNTER — Ambulatory Visit (HOSPITAL_COMMUNITY)
Admission: EM | Admit: 2022-09-09 | Discharge: 2022-09-09 | Disposition: A | Discharge status: Home / Self Care | Payer: 59 | Attending: Family Medicine | Admitting: Family Medicine

## 2022-09-09 ENCOUNTER — Encounter (HOSPITAL_COMMUNITY): Payer: Self-pay

## 2022-09-09 DIAGNOSIS — R1011 Right upper quadrant pain: Secondary | ICD-10-CM | POA: Insufficient documentation

## 2022-09-09 DIAGNOSIS — K7031 Alcoholic cirrhosis of liver with ascites: Secondary | ICD-10-CM | POA: Diagnosis present

## 2022-09-09 LAB — CBC WITH DIFFERENTIAL/PLATELET
Abs Immature Granulocytes: 0 10*3/uL (ref 0.00–0.07)
Basophils Absolute: 0.1 10*3/uL (ref 0.0–0.1)
Basophils Relative: 1 %
Eosinophils Absolute: 0.5 10*3/uL (ref 0.0–0.5)
Eosinophils Relative: 12 %
HCT: 40.4 % (ref 36.0–46.0)
Hemoglobin: 13 g/dL (ref 12.0–15.0)
Immature Granulocytes: 0 %
Lymphocytes Relative: 35 %
Lymphs Abs: 1.4 10*3/uL (ref 0.7–4.0)
MCH: 26.1 pg (ref 26.0–34.0)
MCHC: 32.2 g/dL (ref 30.0–36.0)
MCV: 81 fL (ref 80.0–100.0)
Monocytes Absolute: 0.4 10*3/uL (ref 0.1–1.0)
Monocytes Relative: 10 %
Neutro Abs: 1.7 10*3/uL (ref 1.7–7.7)
Neutrophils Relative %: 42 %
Platelets: 123 10*3/uL — ABNORMAL LOW (ref 150–400)
RBC: 4.99 MIL/uL (ref 3.87–5.11)
RDW: 20.3 % — ABNORMAL HIGH (ref 11.5–15.5)
WBC: 3.9 10*3/uL — ABNORMAL LOW (ref 4.0–10.5)
nRBC: 0 % (ref 0.0–0.2)

## 2022-09-09 LAB — POCT URINALYSIS DIPSTICK, ED / UC
Bilirubin Urine: NEGATIVE
Glucose, UA: NEGATIVE mg/dL
Leukocytes,Ua: NEGATIVE
Nitrite: NEGATIVE
Protein, ur: NEGATIVE mg/dL
Specific Gravity, Urine: 1.025 (ref 1.005–1.030)
Urobilinogen, UA: 4 mg/dL — ABNORMAL HIGH (ref 0.0–1.0)
pH: 6.5 (ref 5.0–8.0)

## 2022-09-09 LAB — COMPREHENSIVE METABOLIC PANEL
ALT: 16 U/L (ref 0–44)
AST: 29 U/L (ref 15–41)
Albumin: 2.9 g/dL — ABNORMAL LOW (ref 3.5–5.0)
Alkaline Phosphatase: 144 U/L — ABNORMAL HIGH (ref 38–126)
Anion gap: 6 (ref 5–15)
BUN: 7 mg/dL (ref 6–20)
CO2: 24 mmol/L (ref 22–32)
Calcium: 9.7 mg/dL (ref 8.9–10.3)
Chloride: 108 mmol/L (ref 98–111)
Creatinine, Ser: 0.67 mg/dL (ref 0.44–1.00)
GFR, Estimated: >60 mL/min (ref >60)
Glucose, Bld: 94 mg/dL (ref 70–99)
Potassium: 3.6 mmol/L (ref 3.5–5.1)
Sodium: 138 mmol/L (ref 135–145)
Total Bilirubin: 1.1 mg/dL (ref 0.3–1.2)
Total Protein: 6.3 g/dL — ABNORMAL LOW (ref 6.5–8.1)

## 2022-09-09 LAB — LIPASE, BLOOD: Lipase: 23 U/L (ref 11–51)

## 2022-09-09 LAB — AMMONIA: Ammonia: 119 umol/L — ABNORMAL HIGH (ref 9–35)

## 2022-09-09 MED ORDER — KETOROLAC TROMETHAMINE 30 MG/ML IJ SOLN
INTRAMUSCULAR | Status: AC
  Filled 2022-09-09: qty 1

## 2022-09-09 MED ORDER — KETOROLAC TROMETHAMINE 30 MG/ML IJ SOLN
30.0000 mg | Freq: Once | INTRAMUSCULAR | Status: AC
  Administered 2022-09-09: 30 mg via INTRAMUSCULAR

## 2022-09-09 NOTE — ED Triage Notes (Signed)
Pt states right sided abd pain on and off for the past 2 months states it is getting worse. History of cirrhosis of the liver.

## 2022-09-09 NOTE — Discharge Instructions (Addendum)
You have had labs (blood tests) sent today. We will call you with any significant abnormalities or if there is need to begin or change treatment or pursue further follow up.  You may also review your test results online through MyChart. If you do not have a MyChart account, instructions to sign up should be on your discharge paperwork.  

## 2022-09-10 NOTE — ED Provider Notes (Signed)
[place]   DT:[id number] 09/09/22 Arrival Time: [place]:  1. Right upper quadrant abdominal pain     Benign abdominal exam except for slight distention. Afebrile. Without n/v/HA. Reports she should be able to see her primary or GI specialist tomorrow. Will draw labs this evening.  Meds ordered this encounter  Medications   ketorolac (TORADOL) 30 MG/ML injection 30 mg     Discharge Instructions      You have had labs (blood tests) sent today. We will call you with any significant abnormalities or if there is need to begin or change treatment or pursue further follow up.  You may also review your test results online through [place]. If you do not have a MyChart account, instructions to sign up should be on your discharge paperwork.     Follow-up Information     [place] [facility] at [facility]   Specialty: Emergency Medicine Why: If symptoms worsen in any way. Contact information: [address] [id number] [place] [phone]                Reviewed expectations re: course of current medical issues. Questions answered. Outlined signs and symptoms indicating need for more acute intervention. Patient verbalized understanding. After Visit Summary given.   SUBJECTIVE: History from: patient. Katie Holland is a 57 y.o. female who presents with complaint of right sided abd pain on and off for the past 2 months states it is getting worse. History of cirrhosis of the liver. Has noted some abdominal distention. Denies fever. Tolerating PO intake without n/v/d. Denies HA/confusion. Normal bowel/bladder habits. TIPS revision 7/23.  No LMP recorded. Patient is postmenopausal.  Past Surgical History:  Procedure Laterality Date   BACK SURGERY     L3-L4   BIOPSY  08/07/2020   Procedure: BIOPSY;  Surgeon: Yetta Flock, MD;  Location: [facility];  Service: Gastroenterology;;    COLONOSCOPY WITH PROPOFOL N/A 08/01/2019   Procedure: COLONOSCOPY WITH PROPOFOL;  Surgeon: Yetta Flock, MD;  [facility];  Service: Gastroenterology;  Laterality: N/A;   ESOPHAGEAL BANDING  04/25/2018   Procedure: ESOPHAGEAL BANDING;  Surgeon: Yetta Flock, MD;  Location: [facility]COPY;  Service: Gastroenterology;;   ESOPHAGEAL BANDING  04/27/2018   Procedure: ESOPHAGEAL BANDING;  Surgeon: Milus Banister, MD;  Location: [place];  Service: Endoscopy;;   ESOPHAGEAL BANDING  06/02/2018   Procedure: ESOPHAGEAL BANDING;  Surgeon: Doran Stabler, MD;  Location: [place];  Service: Gastroenterology;;   ESOPHAGEAL BANDING N/A 07/07/2018   Procedure: ESOPHAGEAL BANDING;  Surgeon: Yetta Flock, MD;  [facility];  Service: Gastroenterology;  Laterality: N/A;   ESOPHAGEAL BANDING  07/27/2018   Procedure: ESOPHAGEAL BANDING;  Surgeon: Yetta Flock, MD;  [facility];  Service: Gastroenterology;;   ESOPHAGEAL BANDING  08/30/2018   Procedure: ESOPHAGEAL BANDING;  Surgeon: Yetta Flock, MD;  [facility];  Service: Gastroenterology;;   ESOPHAGOGASTRODUODENOSCOPY N/A 04/27/2018   Procedure: ESOPHAGOGASTRODUODENOSCOPY (EGD);  Surgeon: Milus Banister, MD;  Location: [facility] ENDOSCOPY;  Service: Endoscopy;  Laterality: N/A;   ESOPHAGOGASTRODUODENOSCOPY (EGD) WITH PROPOFOL N/A 04/25/2018   Procedure: ESOPHAGOGASTRODUODENOSCOPY (EGD) WITH PROPOFOL;  Surgeon: Yetta Flock, MD;  Location: [place];  Service: Gastroenterology;  Laterality: N/A;   ESOPHAGOGASTRODUODENOSCOPY (EGD) WITH PROPOFOL N/A 06/02/2018   Procedure: ESOPHAGOGASTRODUODENOSCOPY (EGD) WITH PROPOFOL;  Surgeon: Doran Stabler, MD;  Location: [place];  Service: Gastroenterology;  Laterality: N/A;  ESOPHAGOGASTRODUODENOSCOPY (EGD) WITH PROPOFOL N/A 07/07/2018   Procedure: ESOPHAGOGASTRODUODENOSCOPY (EGD) WITH PROPOFOL;  Surgeon: Yetta Flock, MD;   [facility];  Service: Gastroenterology;  Laterality: N/A;   ESOPHAGOGASTRODUODENOSCOPY (EGD) WITH PROPOFOL N/A 07/27/2018   Procedure: ESOPHAGOGASTRODUODENOSCOPY (EGD) WITH PROPOFOL;  Surgeon: Yetta Flock, MD;  [facility];  Service: Gastroenterology;  Laterality: N/A;   ESOPHAGOGASTRODUODENOSCOPY (EGD) WITH PROPOFOL N/A 08/30/2018   Procedure: ESOPHAGOGASTRODUODENOSCOPY (EGD) WITH PROPOFOL;  Surgeon: Yetta Flock, MD;  [facility];  Service: Gastroenterology;  Laterality: N/A;   ESOPHAGOGASTRODUODENOSCOPY (EGD) WITH PROPOFOL N/A 11/08/2018   Procedure: ESOPHAGOGASTRODUODENOSCOPY (EGD) WITH PROPOFOL;  Surgeon: Yetta Flock, MD;  [facility];  Service: Gastroenterology;  Laterality: N/A;   ESOPHAGOGASTRODUODENOSCOPY (EGD) WITH PROPOFOL N/A 01/31/2019   Procedure: ESOPHAGOGASTRODUODENOSCOPY (EGD) WITH PROPOFOL;  Surgeon: Yetta Flock, MD;  [facility];  Service: Gastroenterology;  Laterality: N/A;   ESOPHAGOGASTRODUODENOSCOPY (EGD) WITH PROPOFOL N/A 08/01/2019   Procedure: ESOPHAGOGASTRODUODENOSCOPY (EGD) WITH PROPOFOL;  Surgeon: Yetta Flock, MD;  [facility];  Service: Gastroenterology;  Laterality: N/A;   ESOPHAGOGASTRODUODENOSCOPY (EGD) WITH PROPOFOL N/A 08/07/2020   Procedure: ESOPHAGOGASTRODUODENOSCOPY (EGD) WITH PROPOFOL;  Surgeon: Yetta Flock, MD;  [facility];  Service: Gastroenterology;  Laterality: N/A;   ESOPHAGOGASTRODUODENOSCOPY (EGD) WITH PROPOFOL N/A 08/22/2021   Procedure: ESOPHAGOGASTRODUODENOSCOPY (EGD) WITH PROPOFOL;  Surgeon: Yetta Flock, MD;  [facility];  Service: Gastroenterology;  Laterality: N/A;   IR EMBO ART  VEN HEMORR LYMPH EXTRAV  INC GUIDE ROADMAPPING  01/02/2022   IR INTRAVASCULAR ULTRASOUND NON CORONARY  11/05/2021   IR PARACENTESIS  04/26/2018   IR PARACENTESIS  05/18/2018   IR PARACENTESIS  05/31/2018   IR PARACENTESIS  06/30/2018    IR PARACENTESIS  08/05/2018   IR PARACENTESIS  08/31/2018   IR PARACENTESIS  10/08/2018   IR PARACENTESIS  10/28/2018   IR PARACENTESIS  02/14/2019   IR PARACENTESIS  03/09/2019   IR PARACENTESIS  04/05/2019   IR PARACENTESIS  05/03/2019   IR PARACENTESIS  06/20/2019   IR PARACENTESIS  07/19/2019   IR PARACENTESIS  08/19/2019   IR PARACENTESIS  09/27/2019   IR PARACENTESIS  10/07/2019   IR PARACENTESIS  10/28/2019   IR PARACENTESIS  11/16/2019   IR PARACENTESIS  12/30/2019   IR PARACENTESIS  01/16/2020   IR PARACENTESIS  02/07/2020   IR PARACENTESIS  03/06/2020   IR PARACENTESIS  04/04/2020   IR PARACENTESIS  04/17/2020   IR PARACENTESIS  05/04/2020   IR PARACENTESIS  06/07/2020   IR PARACENTESIS  06/28/2020   IR PARACENTESIS  07/27/2020   IR PARACENTESIS  08/20/2020   IR PARACENTESIS  09/13/2020   IR PARACENTESIS  10/04/2020   IR PARACENTESIS  10/25/2020   IR PARACENTESIS  11/07/2020   IR PARACENTESIS  11/30/2020   IR PARACENTESIS  12/13/2020   IR PARACENTESIS  01/04/2021   IR PARACENTESIS  01/25/2021   IR PARACENTESIS  02/20/2021   IR PARACENTESIS  03/20/2021   IR PARACENTESIS  04/18/2021   IR PARACENTESIS  05/14/2021   IR PARACENTESIS  06/10/2021   IR PARACENTESIS  06/27/2021   IR PARACENTESIS  07/17/2021   IR PARACENTESIS  08/12/2021   IR PARACENTESIS  08/23/2021   IR PARACENTESIS  09/13/2021   IR PARACENTESIS  10/02/2021   IR PARACENTESIS  10/22/2021   IR PARACENTESIS  11/01/2021   IR PARACENTESIS  11/29/2021   IR PARACENTESIS  12/18/2021   IR PARACENTESIS  01/02/2022   IR  RADIOLOGIST EVAL & MGMT  12/23/2018   IR RADIOLOGIST EVAL & MGMT  10/11/2021   IR RADIOLOGIST EVAL & MGMT  12/27/2021   IR THORACENTESIS ASP PLEURAL SPACE W/IMG GUIDE  12/10/2018   IR THORACENTESIS ASP PLEURAL SPACE W/IMG GUIDE  01/21/2019   IR THORACENTESIS ASP PLEURAL SPACE W/IMG GUIDE  10/23/2021   IR THORACENTESIS ASP PLEURAL SPACE W/IMG GUIDE  10/25/2021   IR THORACENTESIS ASP PLEURAL SPACE W/IMG GUIDE  11/01/2021   IR THORACENTESIS ASP  PLEURAL SPACE W/IMG GUIDE  01/03/2022   IR THROMBECT VENO MECH MOD SED  01/02/2022   IR TIPS  11/01/2021   IR TIPS REVISION MOD SED  01/02/2022   IR TRANSCATH RETRIEVAL FB INCL GUIDANCE (MS)  01/02/2022   IR US GUIDE VASC ACCESS RIGHT  11/01/2021   IR US GUIDE VASC ACCESS RIGHT  11/01/2021   IR US GUIDE VASC ACCESS RIGHT  11/01/2021   IR US GUIDE VASC ACCESS RIGHT  01/02/2022   RADIOLOGY WITH ANESTHESIA N/A 11/01/2021   Procedure: TIPS;  Surgeon: Criselda Peaches, MD;  Location: [place];  Service: Radiology;  Laterality: N/A;     OBJECTIVE:  Vitals:   09/09/22 1644  BP: (!) 156/88  Pulse: 87  Resp: 16  Temp: 98.6 F (37 C)  TempSrc: Oral  SpO2: 93%    General appearance: alert, oriented, no acute distress HEENT: [place]; AT; oropharynx moist Lungs: unlabored respirations Abdomen: soft; with mild distention; no specific tenderness to palpation; hypoactive bowel sounds;  without guarding or rebound tenderness Back: without reported CVA tenderness; FROM at waist Extremities: without LE edema; symmetrical; without gross deformities Skin: warm and dry Neurologic: normal gait Psychological: alert and cooperative; normal mood and affect   Allergies  Allergen Reactions   Contrast Media [Iodinated Contrast Media] Itching and Swelling    Needs 13-hr prep   Latex Itching, Swelling and Other (See Comments)   Sulfa Antibiotics Itching, Swelling and Other (See Comments)    No breathing impairment                                               Past Medical History:  Diagnosis Date   Alcoholic liver disease (HCC)    Chronic back pain    on disability   Cirrhosis of liver (HCC)    Esophageal varices (HCC)    GERD (gastroesophageal reflux disease)    Headache    HEPATIC Hydrothorax    S/P tips   Jaundice     Social History   Socioeconomic History   Marital status: Married    Spouse name: Not on file   Number of children: 8   Years of education: Not on file   Highest education level:  Not on file  Occupational History   Occupation: disability  Tobacco Use   Smoking status: Some Days    Packs/day: 0.25    Years: 20.00    Additional pack years: 0.00    Total pack years: 5.00    Types: Cigarettes   Smokeless tobacco: Never   Tobacco comments:    5 a day  Vaping Use   Vaping Use: Former  Substance and Sexual Activity   Alcohol use: Not Currently   Drug use: Not Currently   Sexual activity: Not Currently  Other Topics Concern   Not on file  Social History Narrative   Not  on file   Social Determinants of Health   Financial Resource Strain: High Risk (11/22/2021)   Overall Financial Resource Strain (CARDIA)    Difficulty of Paying Living Expenses: Hard  Food Insecurity: No Food Insecurity (11/19/2021)   Hunger Vital Sign    Worried About Running Out of Food in the Last Year: Never true    Ran Out of Food in the Last Year: Never true  Transportation Needs: No Transportation Needs (11/19/2021)   PRAPARE - Hydrologist (Medical): No    Lack of Transportation (Non-Medical): No  Physical Activity: Insufficiently Active (11/19/2021)   Exercise Vital Sign    Days of Exercise per Week: 2 days    Minutes of Exercise per Session: 20 min  Stress: No Stress Concern Present (12/18/2020)   [place]    Feeling of Stress : Not at all  Social Connections: Socially Isolated (11/19/2021)   Social Connection and Isolation Panel [NHANES]    Frequency of Communication with Friends and Family: Three times a week    Frequency of Social Gatherings with Friends and Family: Twice a week    Attends Religious Services: Never    Marine scientist or Organizations: No    Attends Archivist Meetings: Never    Marital Status: Divorced  Human resources officer Violence: Not At Risk (11/19/2021)   Humiliation, Afraid, Rape, and Kick questionnaire    Fear of Current or Ex-Partner: No     Emotionally Abused: No    Physically Abused: No    Sexually Abused: No    Family History  Problem Relation Age of Onset   Colon cancer Neg Hx    Esophageal cancer Neg Hx    Pancreatic cancer Neg Hx    Stomach cancer Neg Hx      Vanessa Kick, MD 09/10/22 [phone]

## 2022-09-11 ENCOUNTER — Telehealth: Payer: Self-pay | Admitting: Gastroenterology

## 2022-09-11 NOTE — Telephone Encounter (Signed)
Inbound call from patient, wishing to schedule a sooner appointment with Dr. Havery Moros. Patient states she has cirrhosis and has been experiencing numbess in her legs and arms. Would like to speak with a nurse to further advise if she should go to the ED or not.

## 2022-09-11 NOTE — Telephone Encounter (Signed)
Pt returned call. No sooner appt with Dr. Havery Moros, pt was scheduled with Tye Savoy, NP on 09/16/22 at 9 am. Pt was advised to go to ED regarding other symptoms.

## 2022-09-11 NOTE — Telephone Encounter (Signed)
Lm on vm for patient to return call 

## 2022-09-12 ENCOUNTER — Other Ambulatory Visit: Payer: Self-pay | Admitting: Gastroenterology

## 2022-09-12 DIAGNOSIS — K7682 Hepatic encephalopathy: Secondary | ICD-10-CM

## 2022-09-15 NOTE — Progress Notes (Deleted)
Assessment   Decompensated Etoh cirrhosis.  Seen here on 2/28 for comprehensive liver visit. Please refer to that note for details.      Plan        History of Present Illness   Chief complaint:      Katie Holland is a 57 y.o. female known to Dr. Enis Gash . She has decompensated Etoh cirrhosis. She was just here to a visit with Dr. Havery Moros on 07/31/22, please refer to that extensive office note. She was advised to follow up in 6 months but called the office several days ago wanting to be seen for numbrness of extremities and was given this appt to see me. This is my first time meeting her.    Interval History:     Imaging:    Labs:     Latest Ref Rng & Units 09/09/2022    5:35 PM 06/09/2022    3:26 PM 04/24/2022    3:21 AM  Hepatic Function  Total Protein 6.5 - 8.1 g/dL 6.3  7.3  6.3   Albumin 3.5 - 5.0 g/dL 2.9  3.5  2.7   AST 15 - 41 U/L 29  28  33   ALT 0 - 44 U/L 16  14  16    Alk Phosphatase 38 - 126 U/L 144  155  111   Total Bilirubin 0.3 - 1.2 mg/dL 1.1  1.1  1.5        Latest Ref Rng & Units 09/09/2022    5:35 PM 06/09/2022    3:26 PM 04/24/2022    3:21 AM  CBC  WBC 4.0 - 10.5 K/uL 3.9  4.4  3.2   Hemoglobin 12.0 - 15.0 g/dL 13.0  12.1  10.5   Hematocrit 36.0 - 46.0 % 40.4  38.2  33.8   Platelets 150 - 400 K/uL 123  219.0 Repeated and verified X2.  157       Previous GI Evaluation      Past Medical History:  Diagnosis Date   Alcoholic liver disease (HCC)    Chronic back pain    on disability   Cirrhosis of liver (HCC)    Esophageal varices (HCC)    GERD (gastroesophageal reflux disease)    Headache    HEPATIC Hydrothorax    S/P tips   Jaundice     Past Surgical History:  Procedure Laterality Date   BACK SURGERY     L3-L4   BIOPSY  08/07/2020   Procedure: BIOPSY;  Surgeon: Yetta Flock, MD;  [facility];  Service: Gastroenterology;;   COLONOSCOPY WITH PROPOFOL N/A 08/01/2019   Procedure: COLONOSCOPY WITH  PROPOFOL;  Surgeon: Yetta Flock, MD;  [facility];  Service: Gastroenterology;  Laterality: N/A;   ESOPHAGEAL BANDING  04/25/2018   Procedure: ESOPHAGEAL BANDING;  Surgeon: Yetta Flock, MD;  Location: [facility] ENDOSCOPY;  Service: Gastroenterology;;   ESOPHAGEAL BANDING  04/27/2018   Procedure: ESOPHAGEAL BANDING;  Surgeon: Milus Banister, MD;  Location: [place];  Service: Endoscopy;;   ESOPHAGEAL BANDING  06/02/2018   Procedure: ESOPHAGEAL BANDING;  Surgeon: Doran Stabler, MD;  Location: [place];  Service: Gastroenterology;;   ESOPHAGEAL BANDING N/A 07/07/2018   Procedure: ESOPHAGEAL BANDING;  Surgeon: Yetta Flock, MD;  [facility];  Service: Gastroenterology;  Laterality: N/A;   ESOPHAGEAL BANDING  07/27/2018   Procedure: ESOPHAGEAL BANDING;  Surgeon: Yetta Flock, MD;  [facility];  Service: Gastroenterology;;   ESOPHAGEAL BANDING  08/30/2018   Procedure: ESOPHAGEAL BANDING;  Surgeon: Yetta Flock, MD;  Location: [facility];  Service: Gastroenterology;;   ESOPHAGOGASTRODUODENOSCOPY N/A 04/27/2018   Procedure: ESOPHAGOGASTRODUODENOSCOPY (EGD);  Surgeon: Milus Banister, MD;  Location: [facility] ENDOSCOPY;  Service: Endoscopy;  Laterality: N/A;   ESOPHAGOGASTRODUODENOSCOPY (EGD) WITH PROPOFOL N/A 04/25/2018   Procedure: ESOPHAGOGASTRODUODENOSCOPY (EGD) WITH PROPOFOL;  Surgeon: Yetta Flock, MD;  Location: [place];  Service: Gastroenterology;  Laterality: N/A;   ESOPHAGOGASTRODUODENOSCOPY (EGD) WITH PROPOFOL N/A 06/02/2018   Procedure: ESOPHAGOGASTRODUODENOSCOPY (EGD) WITH PROPOFOL;  Surgeon: Doran Stabler, MD;  Location: [place];  Service: Gastroenterology;  Laterality: N/A;   ESOPHAGOGASTRODUODENOSCOPY (EGD) WITH PROPOFOL N/A 07/07/2018   Procedure: ESOPHAGOGASTRODUODENOSCOPY (EGD) WITH PROPOFOL;  Surgeon: Yetta Flock, MD;  [facility];  Service: Gastroenterology;  Laterality:  N/A;   ESOPHAGOGASTRODUODENOSCOPY (EGD) WITH PROPOFOL N/A 07/27/2018   Procedure: ESOPHAGOGASTRODUODENOSCOPY (EGD) WITH PROPOFOL;  Surgeon: Yetta Flock, MD;  [facility];  Service: Gastroenterology;  Laterality: N/A;   ESOPHAGOGASTRODUODENOSCOPY (EGD) WITH PROPOFOL N/A 08/30/2018   Procedure: ESOPHAGOGASTRODUODENOSCOPY (EGD) WITH PROPOFOL;  Surgeon: Yetta Flock, MD;  [facility];  Service: Gastroenterology;  Laterality: N/A;   ESOPHAGOGASTRODUODENOSCOPY (EGD) WITH PROPOFOL N/A 11/08/2018   Procedure: ESOPHAGOGASTRODUODENOSCOPY (EGD) WITH PROPOFOL;  Surgeon: Yetta Flock, MD;  [facility];  Service: Gastroenterology;  Laterality: N/A;   ESOPHAGOGASTRODUODENOSCOPY (EGD) WITH PROPOFOL N/A 01/31/2019   Procedure: ESOPHAGOGASTRODUODENOSCOPY (EGD) WITH PROPOFOL;  Surgeon: Yetta Flock, MD;  [facility];  Service: Gastroenterology;  Laterality: N/A;   ESOPHAGOGASTRODUODENOSCOPY (EGD) WITH PROPOFOL N/A 08/01/2019   Procedure: ESOPHAGOGASTRODUODENOSCOPY (EGD) WITH PROPOFOL;  Surgeon: Yetta Flock, MD;  [facility];  Service: Gastroenterology;  Laterality: N/A;   ESOPHAGOGASTRODUODENOSCOPY (EGD) WITH PROPOFOL N/A 08/07/2020   Procedure: ESOPHAGOGASTRODUODENOSCOPY (EGD) WITH PROPOFOL;  Surgeon: Yetta Flock, MD;  [facility];  Service: Gastroenterology;  Laterality: N/A;   ESOPHAGOGASTRODUODENOSCOPY (EGD) WITH PROPOFOL N/A 08/22/2021   Procedure: ESOPHAGOGASTRODUODENOSCOPY (EGD) WITH PROPOFOL;  Surgeon: Yetta Flock, MD;  [facility];  Service: Gastroenterology;  Laterality: N/A;   IR EMBO ART  VEN HEMORR LYMPH EXTRAV  INC GUIDE ROADMAPPING  01/02/2022   IR INTRAVASCULAR ULTRASOUND NON CORONARY  11/05/2021   IR PARACENTESIS  04/26/2018   IR PARACENTESIS  05/18/2018   IR PARACENTESIS  05/31/2018   IR PARACENTESIS  06/30/2018   IR PARACENTESIS  08/05/2018   IR PARACENTESIS  08/31/2018   IR  PARACENTESIS  10/08/2018   IR PARACENTESIS  10/28/2018   IR PARACENTESIS  02/14/2019   IR PARACENTESIS  03/09/2019   IR PARACENTESIS  04/05/2019   IR PARACENTESIS  05/03/2019   IR PARACENTESIS  06/20/2019   IR PARACENTESIS  07/19/2019   IR PARACENTESIS  08/19/2019   IR PARACENTESIS  09/27/2019   IR PARACENTESIS  10/07/2019   IR PARACENTESIS  10/28/2019   IR PARACENTESIS  11/16/2019   IR PARACENTESIS  12/30/2019   IR PARACENTESIS  01/16/2020   IR PARACENTESIS  02/07/2020   IR PARACENTESIS  03/06/2020   IR PARACENTESIS  04/04/2020   IR PARACENTESIS  04/17/2020   IR PARACENTESIS  05/04/2020   IR PARACENTESIS  06/07/2020   IR PARACENTESIS  06/28/2020   IR PARACENTESIS  07/27/2020   IR PARACENTESIS  08/20/2020   IR PARACENTESIS  09/13/2020   IR PARACENTESIS  10/04/2020   IR PARACENTESIS  10/25/2020   IR PARACENTESIS  11/07/2020   IR PARACENTESIS  11/30/2020   IR PARACENTESIS  12/13/2020  IR PARACENTESIS  01/04/2021   IR PARACENTESIS  01/25/2021   IR PARACENTESIS  02/20/2021   IR PARACENTESIS  03/20/2021   IR PARACENTESIS  04/18/2021   IR PARACENTESIS  05/14/2021   IR PARACENTESIS  06/10/2021   IR PARACENTESIS  06/27/2021   IR PARACENTESIS  07/17/2021   IR PARACENTESIS  08/12/2021   IR PARACENTESIS  08/23/2021   IR PARACENTESIS  09/13/2021   IR PARACENTESIS  10/02/2021   IR PARACENTESIS  10/22/2021   IR PARACENTESIS  11/01/2021   IR PARACENTESIS  11/29/2021   IR PARACENTESIS  12/18/2021   IR PARACENTESIS  01/02/2022   IR RADIOLOGIST EVAL & MGMT  12/23/2018   IR RADIOLOGIST EVAL & MGMT  10/11/2021   IR RADIOLOGIST EVAL & MGMT  12/27/2021   IR THORACENTESIS ASP PLEURAL SPACE W/IMG GUIDE  12/10/2018   IR THORACENTESIS ASP PLEURAL SPACE W/IMG GUIDE  01/21/2019   IR THORACENTESIS ASP PLEURAL SPACE W/IMG GUIDE  10/23/2021   IR THORACENTESIS ASP PLEURAL SPACE W/IMG GUIDE  10/25/2021   IR THORACENTESIS ASP PLEURAL SPACE W/IMG GUIDE  11/01/2021   IR THORACENTESIS ASP PLEURAL SPACE W/IMG GUIDE  01/03/2022   IR THROMBECT VENO MECH MOD  SED  01/02/2022   IR TIPS  11/01/2021   IR TIPS REVISION MOD SED  01/02/2022   IR TRANSCATH RETRIEVAL FB INCL GUIDANCE (MS)  01/02/2022   IR US GUIDE VASC ACCESS RIGHT  11/01/2021   IR US GUIDE VASC ACCESS RIGHT  11/01/2021   IR US GUIDE VASC ACCESS RIGHT  11/01/2021   IR US GUIDE VASC ACCESS RIGHT  01/02/2022   RADIOLOGY WITH ANESTHESIA N/A 11/01/2021   Procedure: TIPS;  Surgeon: Criselda Peaches, MD;  Location: [place];  Service: Radiology;  Laterality: N/A;    Current Medications, Allergies, Family History and Social History were reviewed in [employer] record.     Current Outpatient Medications  Medication Sig Dispense Refill   acetaminophen (TYLENOL) 500 MG tablet Take 500-1,000 mg by mouth daily as needed for mild pain, headache or fever.     lactulose (CHRONULAC) 10 GM/15ML solution Take 45 mLs (30 g total) by mouth 2 (two) times daily. 1892 mL 12   Multiple Vitamin (MULTIVITAMIN WITH MINERALS) TABS tablet Take 1 tablet by mouth daily.     rifaximin (XIFAXAN) 550 MG TABS tablet Take 1 tablet (550 mg total) by mouth 2 (two) times daily. 60 tablet 1   thiamine (VITAMIN B-1) 100 MG tablet Take 1 tablet (100 mg total) by mouth daily. 30 tablet 0   No current facility-administered medications for this visit.   Facility-Administered Medications Ordered in Other Visits  Medication Dose Route Frequency Provider Last Rate Last Admin   lactated ringers infusion    Continuous PRN Leonor Liv, CRNA   New Bag at 03/09/19 0800   lactated ringers infusion    Continuous PRN Leonor Liv, CRNA   New Bag at 03/09/19 [id number]    Review of Systems: No chest pain. No shortness of breath. No urinary complaints.    Physical Exam  Wt Readings from Last 3 Encounters:  07/31/22 141 lb 8 oz (64.2 kg)  06/09/22 137 lb 12.8 oz (62.5 kg)  04/25/22 120 lb 13 oz (54.8 kg)    There were no vitals taken for this visit. Constitutional:  Pleasant, generally well appearing ***female in no  acute distress. Psychiatric: Normal mood and affect. Behavior is normal. EENT: Pupils normal.  Conjunctivae are normal. No scleral  icterus. Neck supple.  Cardiovascular: Normal rate, regular rhythm.  Pulmonary/chest: Effort normal and breath sounds normal. No wheezing, rales or rhonchi. Abdominal: Soft, nondistended, nontender. Bowel sounds active throughout. There are no masses palpable. No hepatomegaly. Neurological: Alert and oriented to person place and time.  Extremities: *** edema Skin: Skin is warm and dry. No rashes noted.  Tye Savoy, NP  09/15/2022, 1:32 PM  Cc:  Billie Ruddy, MD

## 2022-09-16 ENCOUNTER — Ambulatory Visit: Payer: 59 | Admitting: Nurse Practitioner

## 2022-09-29 ENCOUNTER — Other Ambulatory Visit: Payer: Self-pay | Admitting: Gastroenterology

## 2022-09-29 DIAGNOSIS — K7682 Hepatic encephalopathy: Secondary | ICD-10-CM

## 2022-10-08 ENCOUNTER — Emergency Department (HOSPITAL_COMMUNITY): Payer: 59

## 2022-10-08 ENCOUNTER — Encounter (HOSPITAL_COMMUNITY): Payer: Self-pay | Admitting: Emergency Medicine

## 2022-10-08 ENCOUNTER — Emergency Department (HOSPITAL_COMMUNITY)
Admission: EM | Admit: 2022-10-08 | Discharge: 2022-10-08 | Disposition: A | Discharge status: Home / Self Care | Payer: 59 | Attending: Emergency Medicine | Admitting: Emergency Medicine

## 2022-10-08 DIAGNOSIS — K7460 Unspecified cirrhosis of liver: Secondary | ICD-10-CM | POA: Diagnosis not present

## 2022-10-08 DIAGNOSIS — R109 Unspecified abdominal pain: Secondary | ICD-10-CM

## 2022-10-08 DIAGNOSIS — Z91040 Latex allergy status: Secondary | ICD-10-CM | POA: Insufficient documentation

## 2022-10-08 DIAGNOSIS — K8020 Calculus of gallbladder without cholecystitis without obstruction: Secondary | ICD-10-CM | POA: Diagnosis not present

## 2022-10-08 LAB — CBC WITH DIFFERENTIAL/PLATELET
Abs Immature Granulocytes: 0.01 10*3/uL (ref 0.00–0.07)
Basophils Absolute: 0 10*3/uL (ref 0.0–0.1)
Basophils Relative: 1 %
Eosinophils Absolute: 0.5 10*3/uL (ref 0.0–0.5)
Eosinophils Relative: 12 %
HCT: 36.9 % (ref 36.0–46.0)
Hemoglobin: 12.1 g/dL (ref 12.0–15.0)
Immature Granulocytes: 0 %
Lymphocytes Relative: 44 %
Lymphs Abs: 1.8 10*3/uL (ref 0.7–4.0)
MCH: 26.7 pg (ref 26.0–34.0)
MCHC: 32.8 g/dL (ref 30.0–36.0)
MCV: 81.5 fL (ref 80.0–100.0)
Monocytes Absolute: 0.3 10*3/uL (ref 0.1–1.0)
Monocytes Relative: 8 %
Neutro Abs: 1.4 10*3/uL — ABNORMAL LOW (ref 1.7–7.7)
Neutrophils Relative %: 35 %
Platelets: 103 10*3/uL — ABNORMAL LOW (ref 150–400)
RBC: 4.53 MIL/uL (ref 3.87–5.11)
RDW: 18.7 % — ABNORMAL HIGH (ref 11.5–15.5)
WBC: 4.1 10*3/uL (ref 4.0–10.5)
nRBC: 0 % (ref 0.0–0.2)

## 2022-10-08 LAB — URINALYSIS, ROUTINE W REFLEX MICROSCOPIC
Bilirubin Urine: NEGATIVE
Glucose, UA: NEGATIVE mg/dL
Hgb urine dipstick: NEGATIVE
Ketones, ur: NEGATIVE mg/dL
Nitrite: NEGATIVE
Protein, ur: NEGATIVE mg/dL
Specific Gravity, Urine: 1.017 (ref 1.005–1.030)
pH: 7 (ref 5.0–8.0)

## 2022-10-08 LAB — COMPREHENSIVE METABOLIC PANEL
ALT: 14 U/L (ref 0–44)
AST: 26 U/L (ref 15–41)
Albumin: 2.8 g/dL — ABNORMAL LOW (ref 3.5–5.0)
Alkaline Phosphatase: 134 U/L — ABNORMAL HIGH (ref 38–126)
Anion gap: 7 (ref 5–15)
BUN: 9 mg/dL (ref 6–20)
CO2: 24 mmol/L (ref 22–32)
Calcium: 9.2 mg/dL (ref 8.9–10.3)
Chloride: 109 mmol/L (ref 98–111)
Creatinine, Ser: 0.8 mg/dL (ref 0.44–1.00)
GFR, Estimated: >60 mL/min (ref >60)
Glucose, Bld: 109 mg/dL — ABNORMAL HIGH (ref 70–99)
Potassium: 3.9 mmol/L (ref 3.5–5.1)
Sodium: 140 mmol/L (ref 135–145)
Total Bilirubin: 0.6 mg/dL (ref 0.3–1.2)
Total Protein: 5.8 g/dL — ABNORMAL LOW (ref 6.5–8.1)

## 2022-10-08 LAB — LIPASE, BLOOD: Lipase: 23 U/L (ref 11–51)

## 2022-10-08 MED ORDER — TIZANIDINE HCL 2 MG PO CAPS: 2.0000 mg | ORAL_CAPSULE | Freq: Two times a day (BID) | ORAL | 0 refills | Status: DC

## 2022-10-08 MED ORDER — DICLOFENAC SODIUM 1 % EX GEL: 2.0000 g | Freq: Four times a day (QID) | CUTANEOUS | 0 refills | Status: DC | PRN

## 2022-10-08 MED ORDER — DICLOFENAC SODIUM 1 % EX GEL
4.0000 g | Freq: Once | CUTANEOUS | Status: AC
  Administered 2022-10-08: 4 g via TOPICAL
  Filled 2022-10-08: qty 100

## 2022-10-08 NOTE — ED Triage Notes (Addendum)
R sided flank pain. Pt states from cirrhosis of her liver. Denies any other problems or concerns. Does not have pain medicine at home to take.

## 2022-10-08 NOTE — ED Provider Notes (Signed)
[place] EMERGENCY DEPARTMENT AT [facility] Provider Note   CSN: [id number] Arrival date & time: 10/08/22  0217     History  Chief Complaint  Patient presents with   Flank Pain    Katie Holland is a 57 y.o. female.  The history is provided by the patient, the spouse and medical records.  Flank Pain  Katie Holland is a 57 y.o. female who presents to the Emergency Department complaining of flank pain.  She was seen emergency department for evaluation of right flank pain.  She reports 3 months of waxing and waning pain in the right flank but over the last several days the pain has become more severe in nature.  Her pain is somewhat constant but worsens with movement.  She is having difficulty walking secondary to pain.  No associated fevers, abdominal pain, nausea, vomiting.  She did have an episode of emesis 2 weeks ago.  No diarrhea, dysuria.  She did have a fall just prior to the initiation of her pain.  She does have a history of cirrhosis and is compliant with her home medications.  Today she took Tylenol for her pain without significant improvement.   Home Medications Prior to Admission medications   Medication Sig Start Date End Date Taking? Authorizing Provider  diclofenac Sodium (VOLTAREN) 1 % GEL Apply 2 g topically 4 (four) times daily as needed. 10/08/22  Yes Tilden Fossa, MD  tizanidine (ZANAFLEX) 2 MG capsule Take 1 capsule (2 mg total) by mouth 2 (two) times daily. 10/08/22  Yes Tilden Fossa, MD  acetaminophen (TYLENOL) 500 MG tablet Take 500-1,000 mg by mouth daily as needed for mild pain, headache or fever.    [provider]  lactulose (CHRONULAC) 10 GM/15ML solution Take 45 mLs (30 g total) by mouth 2 (two) times daily. 06/09/22   Deeann Saint, MD  Multiple Vitamin (MULTIVITAMIN WITH MINERALS) TABS tablet Take 1 tablet by mouth daily. 04/25/22   Glade Lloyd, MD  rifaximin (XIFAXAN) 550 MG TABS tablet TAKE 1 TABLET BY MOUTH TWICE DAILY. 09/29/22    Armbruster, Willaim Rayas, MD  thiamine (VITAMIN B-1) 100 MG tablet Take 1 tablet (100 mg total) by mouth daily. 04/25/22   Glade Lloyd, MD      Allergies    Contrast media [iodinated contrast media], Latex, and Sulfa antibiotics    Review of Systems   Review of Systems  Genitourinary:  Positive for flank pain.  All other systems reviewed and are negative.   Physical Exam Updated Vital Signs BP (!) 142/88   Pulse 88   Temp 98.4 F (36.9 C) (Oral)   Resp 15   SpO2 98%  Physical Exam Vitals and nursing note reviewed.  Constitutional:      Appearance: She is well-developed.  HENT:     Head: Normocephalic and atraumatic.  Cardiovascular:     Rate and Rhythm: Normal rate and regular rhythm.  Pulmonary:     Effort: Pulmonary effort is normal. No respiratory distress.  Abdominal:     Palpations: Abdomen is soft.     Tenderness: There is no abdominal tenderness. There is no guarding or rebound.  Musculoskeletal:     Comments: There is no midline thoracic or lumbar tenderness to palpation.  There is right flank tenderness diffusely without any overlying ecchymosis.  No significant lower extremity edema.  2+ DP pulses bilaterally  Skin:    General: Skin is warm and dry.  Neurological:     Mental Status: She  is alert and oriented to person, place, and time.     Comments: She is able to lift bilateral lower extremities off the stretcher.  Pain does limit how long she can keep her right lower extremity off the stretcher.  Sensation to light touch is intact in bilateral lower extremities although patient does report paresthesias and altered sensation throughout the right lower extremity, which has been ongoing for a while.  Psychiatric:        Behavior: Behavior normal.     ED Results / Procedures / Treatments   Labs (all labs ordered are listed, but only abnormal results are displayed) Labs Reviewed  COMPREHENSIVE METABOLIC PANEL - Abnormal; Notable for the following components:       Result Value   Glucose, Bld 109 (*)    Total Protein 5.8 (*)    Albumin 2.8 (*)    Alkaline Phosphatase 134 (*)    All other components within normal limits  CBC WITH DIFFERENTIAL/PLATELET - Abnormal; Notable for the following components:   RDW 18.7 (*)    Platelets 103 (*)    Neutro Abs 1.4 (*)    All other components within normal limits  URINALYSIS, ROUTINE W REFLEX MICROSCOPIC - Abnormal; Notable for the following components:   APPearance CLOUDY (*)    Leukocytes,Ua MODERATE (*)    Bacteria, UA FEW (*)    All other components within normal limits  LIPASE, BLOOD    EKG None  Radiology CT Renal Stone Study  Result Date: 10/08/2022 CLINICAL DATA:  58 year old female with history of abdominal and flank pain. History of fall 3 months ago. Cirrhosis. EXAM: CT ABDOMEN AND PELVIS WITHOUT CONTRAST TECHNIQUE: Multidetector CT imaging of the abdomen and pelvis was performed following the standard protocol without IV contrast. RADIATION DOSE REDUCTION: This exam was performed according to the departmental dose-optimization program which includes automated exposure control, adjustment of the mA and/or kV according to patient size and/or use of iterative reconstruction technique. COMPARISON:  CT of the abdomen and pelvis 12/30/2018. FINDINGS: Lower chest: Large right pleural effusion with passive atelectasis of the right middle and lower lobes. This appears to be chronic and is similar to prior study from 2020. Hepatobiliary: Liver has a shrunken appearance and nodular contour, indicative of advanced cirrhosis. No discrete cystic or solid hepatic lesions are confidently identified on today's noncontrast CT examination. Calcified gallstones lie dependently in the gallbladder. Gallbladder does not appear distended. No pericholecystic fluid or surrounding inflammatory changes. TIPS noted, incompletely evaluated on today's noncontrast CT examination. Pancreas: Pancreas appears atrophic with numerous  coarse calcifications in the region of the pancreatic head. No definite pancreatic mass or peripancreatic fluid collections or inflammatory changes are noted on today's noncontrast CT examination. Spleen: Unremarkable. Adrenals/Urinary Tract: Unenhanced appearance of the kidneys and bilateral adrenal glands is unremarkable. No hydroureteronephrosis. Urinary bladder is unremarkable in appearance. Stomach/Bowel: Unenhanced appearance of the stomach is unremarkable. No pathologic dilatation of small bowel or colon. Normal appendix. Vascular/Lymphatic: No atherosclerotic calcifications are noted in the abdominal aorta or pelvic vasculature. Multiple phleboliths are incidentally noted. Embolization coils noted in the upper abdomen. No lymphadenopathy noted in the abdomen or pelvis. Reproductive: IUD present in the uterus. Uterus and ovaries are otherwise unremarkable in appearance on today's noncontrast CT examination. Other: Tiny periumbilical hernia containing a small volume of fluid. Trace volume of ascites most evident in the low anatomic pelvis. No pneumoperitoneum. Musculoskeletal: Status post PLIF at L4-L5. There are no aggressive appearing lytic or blastic lesions noted in  the visualized portions of the skeleton. IMPRESSION: 1. No acute findings are noted in the abdomen or pelvis to account for the patient's symptoms. 2. Cholelithiasis without definite imaging findings to suggest an acute cholecystitis at this time. 3. Advanced hepatic cirrhosis status post TIPS. 4. Trace volume of ascites. 5. Large chronic right pleural effusion with chronic passive atelectasis in the right middle and lower lobes, similar to prior study from 2020. 6. Additional incidental findings, as above. Electronically Signed   By: Trudie Reed M.D.   On: 10/08/2022 06:23    Procedures Procedures    Medications Ordered in ED Medications  diclofenac Sodium (VOLTAREN) 1 % topical gel 4 g (4 g Topical Given 10/08/22 0503)    ED  Course/ Medical Decision Making/ A&P                             Medical Decision Making Amount and/or Complexity of Data Reviewed Labs: ordered. Radiology: ordered.  Risk Prescription drug management.   Patient with history of cirrhosis here for evaluation of flank pain that has been present since a fall but significantly worsening recently.  She does have tenderness to palpation over the right flank with symmetric pulses and symmetric strength in bilateral lower extremities.  UA is not consistent with UTI.  CBC and CMP are near their baseline.  CT scan demonstrates chronic findings without any evidence of acute traumatic injury or retroperitoneal hematoma.  No evidence of obstructing stone.  She does have improvement in her symptoms after Voltaren gel administration.  Plan to prescribe this as an outpatient.  Will provide low-dose tizanidine as needed if she has pain not controlled with her Voltaren.  Discussed importance of PCP follow-up as well as return precautions.        Final Clinical Impression(s) / ED Diagnoses Final diagnoses:  Flank pain    Rx / DC Orders ED Discharge Orders          Ordered    tizanidine (ZANAFLEX) 2 MG capsule  2 times daily        10/08/22 0642    diclofenac Sodium (VOLTAREN) 1 % GEL  4 times daily PRN        10/08/22 0981              Tilden Fossa, MD 10/08/22 [phone]

## 2022-10-08 NOTE — Discharge Instructions (Addendum)
Take the least amount of the muscle relaxer tizanidine as possible.  You may take it once a day, twice at the most if needed.

## 2022-10-13 ENCOUNTER — Telehealth: Payer: Self-pay | Admitting: *Deleted

## 2022-10-13 NOTE — Telephone Encounter (Signed)
        Patient  visited [place] ed on 10/08/2022  for treatment   Telephone encounter attempt :  1st  A HIPAA compliant voice message was left requesting a return call.  Instructed patient to call back at [phone].  Yehuda Mao Greenauer -[facility] [place], Population Health [phone] [address] [place] , [place] [address] Email : [email or web address]

## 2022-10-14 ENCOUNTER — Telehealth: Payer: Self-pay | Admitting: *Deleted

## 2022-10-14 NOTE — Telephone Encounter (Signed)
        Patient  visited [place] ed on 10/08/2022  for treatment   Telephone encounter attempt :  2nd  A HIPAA compliant voice message was left requesting a return call.  Instructed patient to call back at [phone].  Katie Mao Greenauer -[facility] , Population Health [phone] [address] [place] , [place] [address] Email : [email or web address]

## 2022-10-22 ENCOUNTER — Telehealth: Payer: Self-pay | Admitting: Family Medicine

## 2022-10-22 NOTE — Telephone Encounter (Signed)
Contacted Katie Holland to schedule their annual wellness visit. Appointment made for 10/29/22.  Katie Holland AWV direct phone # [phone]

## 2022-10-22 NOTE — Telephone Encounter (Signed)
Called patient to schedule Medicare Annual Wellness Visit (AWV). Left message for patient to call back and schedule Medicare Annual Wellness Visit (AWV).  Last date of AWV: 12/18/20  Please schedule an appointment at any time with [facility] or [employer].  If any questions, please contact me at [phone].  Thank you ,  Rudell Cobb AWV direct phone # [phone]

## 2022-10-29 ENCOUNTER — Ambulatory Visit (HOSPITAL_COMMUNITY)
Admission: EM | Admit: 2022-10-29 | Discharge: 2022-10-29 | Disposition: A | Discharge status: Home / Self Care | Payer: 59 | Attending: Internal Medicine | Admitting: Internal Medicine

## 2022-10-29 ENCOUNTER — Ambulatory Visit (INDEPENDENT_AMBULATORY_CARE_PROVIDER_SITE_OTHER): Payer: 59

## 2022-10-29 ENCOUNTER — Encounter (HOSPITAL_COMMUNITY): Payer: Self-pay | Admitting: Emergency Medicine

## 2022-10-29 ENCOUNTER — Other Ambulatory Visit: Payer: Self-pay

## 2022-10-29 VITALS — Ht 64.0 in | Wt 127.0 lb

## 2022-10-29 DIAGNOSIS — R198 Other specified symptoms and signs involving the digestive system and abdomen: Secondary | ICD-10-CM

## 2022-10-29 DIAGNOSIS — Z0000 Encounter for general adult medical examination without abnormal findings: Secondary | ICD-10-CM

## 2022-10-29 DIAGNOSIS — R1011 Right upper quadrant pain: Secondary | ICD-10-CM

## 2022-10-29 DIAGNOSIS — K7460 Unspecified cirrhosis of liver: Secondary | ICD-10-CM | POA: Diagnosis not present

## 2022-10-29 DIAGNOSIS — Z1231 Encounter for screening mammogram for malignant neoplasm of breast: Secondary | ICD-10-CM | POA: Diagnosis not present

## 2022-10-29 DIAGNOSIS — Z122 Encounter for screening for malignant neoplasm of respiratory organs: Secondary | ICD-10-CM | POA: Diagnosis not present

## 2022-10-29 DIAGNOSIS — R109 Unspecified abdominal pain: Secondary | ICD-10-CM

## 2022-10-29 LAB — POCT URINALYSIS DIP (MANUAL ENTRY)
Bilirubin, UA: NEGATIVE
Blood, UA: NEGATIVE
Glucose, UA: NEGATIVE mg/dL
Ketones, POC UA: NEGATIVE mg/dL
Leukocytes, UA: NEGATIVE
Nitrite, UA: NEGATIVE
Protein Ur, POC: NEGATIVE mg/dL
Spec Grav, UA: 1.02 (ref 1.010–1.025)
Urobilinogen, UA: 1 E.U./dL
pH, UA: 7 (ref 5.0–8.0)

## 2022-10-29 NOTE — Patient Instructions (Addendum)
Katie Holland , Thank you for taking time to come for your Medicare Wellness Visit. I appreciate your ongoing commitment to your health goals. Please review the following plan we discussed and let me know if I can assist you in the future.   These are the goals we discussed:  Goals       Patient Stated (pt-stated)      I want to take daily walks        This is a list of the screening recommended for you and due dates:  Health Maintenance  Topic Date Due   DTaP/Tdap/Td vaccine (1 - Tdap) Never done   Pap Smear  Never done   Mammogram  Never done   COVID-19 Vaccine (4 - 2023-24 season) 11/14/2022*   Zoster (Shingles) Vaccine (1 of 2) 01/29/2023*   Flu Shot  01/22/2023   Medicare Annual Wellness Visit  10/29/2023   Colon Cancer Screening  07/31/2029   Hepatitis C Screening: USPSTF Recommendation to screen - Ages 18-79 yo.  Completed   HIV Screening  Completed   HPV Vaccine  Aged Out  *Topic was postponed. The date shown is not the original due date.    Advanced directives: Advance directive discussed with you today. Even though you declined this today, please call our office should you change your mind, and we can give you the proper paperwork for you to fill out.   Conditions/risks identified: None  Next appointment: Follow up in one year for your annual wellness visit.   Preventive Care 40-64 Years, Female Preventive care refers to lifestyle choices and visits with your health care provider that can promote health and wellness. What does preventive care include? A yearly physical exam. This is also called an annual well check. Dental exams once or twice a year. Routine eye exams. Ask your health care provider how often you should have your eyes checked. Personal lifestyle choices, including: Daily care of your teeth and gums. Regular physical activity. Eating a healthy diet. Avoiding tobacco and drug use. Limiting alcohol use. Practicing safe sex. Taking low-dose aspirin  daily starting at age 74. Taking vitamin and mineral supplements as recommended by your health care provider. What happens during an annual well check? The services and screenings done by your health care provider during your annual well check will depend on your age, overall health, lifestyle risk factors, and family history of disease. Counseling  Your health care provider may ask you questions about your: Alcohol use. Tobacco use. Drug use. Emotional well-being. Home and relationship well-being. Sexual activity. Eating habits. Work and work Astronomer. Method of birth control. Menstrual cycle. Pregnancy history. Screening  You may have the following tests or measurements: Height, weight, and BMI. Blood pressure. Lipid and cholesterol levels. These may be checked every 5 years, or more frequently if you are over 71 years old. Skin check. Lung cancer screening. You may have this screening every year starting at age 79 if you have a 30-pack-year history of smoking and currently smoke or have quit within the past 15 years. Fecal occult blood test (FOBT) of the stool. You may have this test every year starting at age 37. Flexible sigmoidoscopy or colonoscopy. You may have a sigmoidoscopy every 5 years or a colonoscopy every 10 years starting at age 81. Hepatitis C blood test. Hepatitis B blood test. Sexually transmitted disease (STD) testing. Diabetes screening. This is done by checking your blood sugar (glucose) after you have not eaten for a while (fasting). You may  have this done every 1-3 years. Mammogram. This may be done every 1-2 years. Talk to your health care provider about when you should start having regular mammograms. This may depend on whether you have a family history of breast cancer. BRCA-related cancer screening. This may be done if you have a family history of breast, ovarian, tubal, or peritoneal cancers. Pelvic exam and Pap test. This may be done every 3 years  starting at age 23. Starting at age 10, this may be done every 5 years if you have a Pap test in combination with an HPV test. Bone density scan. This is done to screen for osteoporosis. You may have this scan if you are at high risk for osteoporosis. Discuss your test results, treatment options, and if necessary, the need for more tests with your health care provider. Vaccines  Your health care provider may recommend certain vaccines, such as: Influenza vaccine. This is recommended every year. Tetanus, diphtheria, and acellular pertussis (Tdap, Td) vaccine. You may need a Td booster every 10 years. Zoster vaccine. You may need this after age 57. Pneumococcal 13-valent conjugate (PCV13) vaccine. You may need this if you have certain conditions and were not previously vaccinated. Pneumococcal polysaccharide (PPSV23) vaccine. You may need one or two doses if you smoke cigarettes or if you have certain conditions. Talk to your health care provider about which screenings and vaccines you need and how often you need them. This information is not intended to replace advice given to you by your health care provider. Make sure you discuss any questions you have with your health care provider. Document Released: 07/06/2015 Document Revised: 02/27/2016 Document Reviewed: 04/10/2015 Elsevier Interactive Patient Education  2017 [employer].    Fall Prevention in the Home Falls can cause injuries. They can happen to people of all ages. There are many things you can do to make your home safe and to help prevent falls. What can I do on the outside of my home? Regularly fix the edges of walkways and driveways and fix any cracks. Remove anything that might make you trip as you walk through a door, such as a raised step or threshold. Trim any bushes or trees on the path to your home. Use bright outdoor lighting. Clear any walking paths of anything that might make someone trip, such as rocks or  tools. Regularly check to see if handrails are loose or broken. Make sure that both sides of any steps have handrails. Any raised decks and porches should have guardrails on the edges. Have any leaves, snow, or ice cleared regularly. Use sand or salt on walking paths during winter. Clean up any spills in your garage right away. This includes oil or grease spills. What can I do in the bathroom? Use night lights. Install grab bars by the toilet and in the tub and shower. Do not use towel bars as grab bars. Use non-skid mats or decals in the tub or shower. If you need to sit down in the shower, use a plastic, non-slip stool. Keep the floor dry. Clean up any water that spills on the floor as soon as it happens. Remove soap buildup in the tub or shower regularly. Attach bath mats securely with double-sided non-slip rug tape. Do not have throw rugs and other things on the floor that can make you trip. What can I do in the bedroom? Use night lights. Make sure that you have a light by your bed that is easy to reach. Do not  use any sheets or blankets that are too big for your bed. They should not hang down onto the floor. Have a firm chair that has side arms. You can use this for support while you get dressed. Do not have throw rugs and other things on the floor that can make you trip. What can I do in the kitchen? Clean up any spills right away. Avoid walking on wet floors. Keep items that you use a lot in easy-to-reach places. If you need to reach something above you, use a strong step stool that has a grab bar. Keep electrical cords out of the way. Do not use floor polish or wax that makes floors slippery. If you must use wax, use non-skid floor wax. Do not have throw rugs and other things on the floor that can make you trip. What can I do with my stairs? Do not leave any items on the stairs. Make sure that there are handrails on both sides of the stairs and use them. Fix handrails that are  broken or loose. Make sure that handrails are as long as the stairways. Check any carpeting to make sure that it is firmly attached to the stairs. Fix any carpet that is loose or worn. Avoid having throw rugs at the top or bottom of the stairs. If you do have throw rugs, attach them to the floor with carpet tape. Make sure that you have a light switch at the top of the stairs and the bottom of the stairs. If you do not have them, ask someone to add them for you. What else can I do to help prevent falls? Wear shoes that: Do not have high heels. Have rubber bottoms. Are comfortable and fit you well. Are closed at the toe. Do not wear sandals. If you use a stepladder: Make sure that it is fully opened. Do not climb a closed stepladder. Make sure that both sides of the stepladder are locked into place. Ask someone to hold it for you, if possible. Clearly mark and make sure that you can see: Any grab bars or handrails. First and last steps. Where the edge of each step is. Use tools that help you move around (mobility aids) if they are needed. These include: Canes. Walkers. Scooters. Crutches. Turn on the lights when you go into a dark area. Replace any light bulbs as soon as they burn out. Set up your furniture so you have a clear path. Avoid moving your furniture around. If any of your floors are uneven, fix them. If there are any pets around you, be aware of where they are. Review your medicines with your doctor. Some medicines can make you feel dizzy. This can increase your chance of falling. Ask your doctor what other things that you can do to help prevent falls. This information is not intended to replace advice given to you by your health care provider. Make sure you discuss any questions you have with your health care provider. Document Released: 04/05/2009 Document Revised: 11/15/2015 Document Reviewed: 07/14/2014 Elsevier Interactive Patient Education  2017 [employer].

## 2022-10-29 NOTE — ED Provider Notes (Signed)
[facility]    CSN: [id number] Arrival date & time: 10/29/22  1410      History   Chief Complaint Chief Complaint  Patient presents with   Abdominal Pain    HPI Katie Holland is a 57 y.o. female.   Patient presents today with a 3-day history of worsening right flank pain.  She reports that pain is rated 10 on a 0-10 pain scale, described as aching, worse with certain movements, no alleviating factors identified.  She has been taking over-the-counter analgesics but is limited in what she can take as result of her medical history including cirrhosis.  She denies history of recurrent UTI, pyelonephritis, nephrolithiasis.  Denies any recent urogenital procedures or catheterization.  Denies any recent antibiotic use or medication changes.  She denies any associated shortness of breath, chest pain, nausea, vomiting, diarrhea.  Reports her last bowel movement was earlier today and was normal; denies any blood or mucus.  Denies previous abdominal surgery and still has her gallbladder.    Past Medical History:  Diagnosis Date   Alcoholic liver disease (HCC)    Chronic back pain    on disability   Cirrhosis of liver (HCC)    Esophageal varices (HCC)    GERD (gastroesophageal reflux disease)    Headache    HEPATIC Hydrothorax    S/P tips   Jaundice     Patient Active Problem List   Diagnosis Date Noted   Hepatic encephalopathy (HCC) 04/23/2022   Pneumothorax on right 11/04/2021   Elevated lactic acid level 10/23/2021   Sepsis (HCC) 10/22/2021   Acute respiratory failure with hypoxia (HCC) 10/22/2021   CAP (community acquired pneumonia) 10/22/2021   Increased ammonia level 10/22/2021   Elevated troponin 10/22/2021   Abnormal TSH 10/22/2021   Adenomatous duodenal polyp    Gastritis and gastroduodenitis    Colon cancer screening    Encephalopathy, hepatic (HCC) 05/06/2019   Ascites 01/20/2019   Pleural effusion 01/20/2019   UTI (urinary tract infection) 01/20/2019    Acute upper GI bleeding 06/02/2018   Alcohol dependence syndrome (HCC) 06/02/2018   Decompensated hepatic cirrhosis (HCC)    Esophageal varices (HCC)    Alcoholic liver disease (HCC)    Protein-calorie malnutrition, severe 04/26/2018   Bleeding esophageal varices (HCC)    Abdominal pain 04/24/2018   Hematemesis 04/24/2018   Hematochezia 04/24/2018   Alcohol abuse 04/24/2018   Hyponatremia 04/24/2018   Hypokalemia 04/24/2018   Metabolic acidosis, normal anion gap (NAG) 04/24/2018   History of GI bleed 10/26/2017   Alcoholic cirrhosis of liver with ascites (HCC) 10/26/2017   Tobacco dependence 10/26/2017    Past Surgical History:  Procedure Laterality Date   BACK SURGERY     L3-L4   BIOPSY  08/07/2020   Procedure: BIOPSY;  Surgeon: Benancio Deeds, MD;  Location: [facility];  Service: Gastroenterology;;   COLONOSCOPY WITH PROPOFOL N/A 08/01/2019   Procedure: COLONOSCOPY WITH PROPOFOL;  Surgeon: Benancio Deeds, MD;  Location: [facility] ENDOSCOPY;  Service: Gastroenterology;  Laterality: N/A;   ESOPHAGEAL BANDING  04/25/2018   Procedure: ESOPHAGEAL BANDING;  Surgeon: Benancio Deeds, MD;  Location: [facility] ENDOSCOPY;  Service: Gastroenterology;;   ESOPHAGEAL BANDING  04/27/2018   Procedure: ESOPHAGEAL BANDING;  Surgeon: Rachael Fee, MD;  Location: [facility] ENDOSCOPY;  Service: Endoscopy;;   ESOPHAGEAL BANDING  06/02/2018   Procedure: ESOPHAGEAL BANDING;  Surgeon: Sherrilyn Rist, MD;  Location: [facility] ENDOSCOPY;  Service: Gastroenterology;;   ESOPHAGEAL BANDING N/A 07/07/2018  Procedure: ESOPHAGEAL BANDING;  Surgeon: Benancio Deeds, MD;  Location: [facility];  Service: Gastroenterology;  Laterality: N/A;   ESOPHAGEAL BANDING  07/27/2018   Procedure: ESOPHAGEAL BANDING;  Surgeon: Benancio Deeds, MD;  Location: [facility] ENDOSCOPY;  Service: Gastroenterology;;   ESOPHAGEAL BANDING  08/30/2018   Procedure: ESOPHAGEAL BANDING;  Surgeon: Benancio Deeds, MD;  Location: [facility]  ENDOSCOPY;  Service: Gastroenterology;;   ESOPHAGOGASTRODUODENOSCOPY N/A 04/27/2018   Procedure: ESOPHAGOGASTRODUODENOSCOPY (EGD);  Surgeon: Rachael Fee, MD;  Location: [facility] ENDOSCOPY;  Service: Endoscopy;  Laterality: N/A;   ESOPHAGOGASTRODUODENOSCOPY (EGD) WITH PROPOFOL N/A 04/25/2018   Procedure: ESOPHAGOGASTRODUODENOSCOPY (EGD) WITH PROPOFOL;  Surgeon: Benancio Deeds, MD;  Location: [facility] ENDOSCOPY;  Service: Gastroenterology;  Laterality: N/A;   ESOPHAGOGASTRODUODENOSCOPY (EGD) WITH PROPOFOL N/A 06/02/2018   Procedure: ESOPHAGOGASTRODUODENOSCOPY (EGD) WITH PROPOFOL;  Surgeon: Sherrilyn Rist, MD;  Location: [facility] ENDOSCOPY;  Service: Gastroenterology;  Laterality: N/A;   ESOPHAGOGASTRODUODENOSCOPY (EGD) WITH PROPOFOL N/A 07/07/2018   Procedure: ESOPHAGOGASTRODUODENOSCOPY (EGD) WITH PROPOFOL;  Surgeon: Benancio Deeds, MD;  Location: [facility] ENDOSCOPY;  Service: Gastroenterology;  Laterality: N/A;   ESOPHAGOGASTRODUODENOSCOPY (EGD) WITH PROPOFOL N/A 07/27/2018   Procedure: ESOPHAGOGASTRODUODENOSCOPY (EGD) WITH PROPOFOL;  Surgeon: Benancio Deeds, MD;  Location: [facility] ENDOSCOPY;  Service: Gastroenterology;  Laterality: N/A;   ESOPHAGOGASTRODUODENOSCOPY (EGD) WITH PROPOFOL N/A 08/30/2018   Procedure: ESOPHAGOGASTRODUODENOSCOPY (EGD) WITH PROPOFOL;  Surgeon: Benancio Deeds, MD;  Location: [facility] ENDOSCOPY;  Service: Gastroenterology;  Laterality: N/A;   ESOPHAGOGASTRODUODENOSCOPY (EGD) WITH PROPOFOL N/A 11/08/2018   Procedure: ESOPHAGOGASTRODUODENOSCOPY (EGD) WITH PROPOFOL;  Surgeon: Benancio Deeds, MD;  Location: [facility] ENDOSCOPY;  Service: Gastroenterology;  Laterality: N/A;   ESOPHAGOGASTRODUODENOSCOPY (EGD) WITH PROPOFOL N/A 01/31/2019   Procedure: ESOPHAGOGASTRODUODENOSCOPY (EGD) WITH PROPOFOL;  Surgeon: Benancio Deeds, MD;  Location: [facility] ENDOSCOPY;  Service: Gastroenterology;  Laterality: N/A;   ESOPHAGOGASTRODUODENOSCOPY (EGD) WITH PROPOFOL N/A 08/01/2019   Procedure:  ESOPHAGOGASTRODUODENOSCOPY (EGD) WITH PROPOFOL;  Surgeon: Benancio Deeds, MD;  Location: [facility] ENDOSCOPY;  Service: Gastroenterology;  Laterality: N/A;   ESOPHAGOGASTRODUODENOSCOPY (EGD) WITH PROPOFOL N/A 08/07/2020   Procedure: ESOPHAGOGASTRODUODENOSCOPY (EGD) WITH PROPOFOL;  Surgeon: Benancio Deeds, MD;  Location: [facility] ENDOSCOPY;  Service: Gastroenterology;  Laterality: N/A;   ESOPHAGOGASTRODUODENOSCOPY (EGD) WITH PROPOFOL N/A 08/22/2021   Procedure: ESOPHAGOGASTRODUODENOSCOPY (EGD) WITH PROPOFOL;  Surgeon: Benancio Deeds, MD;  Location: [facility] ENDOSCOPY;  Service: Gastroenterology;  Laterality: N/A;   IR EMBO ART  VEN HEMORR LYMPH EXTRAV  INC GUIDE ROADMAPPING  01/02/2022   IR INTRAVASCULAR ULTRASOUND NON CORONARY  11/05/2021   IR PARACENTESIS  04/26/2018   IR PARACENTESIS  05/18/2018   IR PARACENTESIS  05/31/2018   IR PARACENTESIS  06/30/2018   IR PARACENTESIS  08/05/2018   IR PARACENTESIS  08/31/2018   IR PARACENTESIS  10/08/2018   IR PARACENTESIS  10/28/2018   IR PARACENTESIS  02/14/2019   IR PARACENTESIS  03/09/2019   IR PARACENTESIS  04/05/2019   IR PARACENTESIS  05/03/2019   IR PARACENTESIS  06/20/2019   IR PARACENTESIS  07/19/2019   IR PARACENTESIS  08/19/2019   IR PARACENTESIS  09/27/2019   IR PARACENTESIS  10/07/2019   IR PARACENTESIS  10/28/2019   IR PARACENTESIS  11/16/2019   IR PARACENTESIS  12/30/2019   IR PARACENTESIS  01/16/2020   IR PARACENTESIS  02/07/2020   IR PARACENTESIS  03/06/2020   IR PARACENTESIS  04/04/2020   IR PARACENTESIS  04/17/2020   IR PARACENTESIS  05/04/2020   IR PARACENTESIS  06/07/2020   IR PARACENTESIS  06/28/2020  IR PARACENTESIS  07/27/2020   IR PARACENTESIS  08/20/2020   IR PARACENTESIS  09/13/2020   IR PARACENTESIS  10/04/2020   IR PARACENTESIS  10/25/2020   IR PARACENTESIS  11/07/2020   IR PARACENTESIS  11/30/2020   IR PARACENTESIS  12/13/2020   IR PARACENTESIS  01/04/2021   IR PARACENTESIS  01/25/2021   IR PARACENTESIS  02/20/2021   IR PARACENTESIS  03/20/2021    IR PARACENTESIS  04/18/2021   IR PARACENTESIS  05/14/2021   IR PARACENTESIS  06/10/2021   IR PARACENTESIS  06/27/2021   IR PARACENTESIS  07/17/2021   IR PARACENTESIS  08/12/2021   IR PARACENTESIS  08/23/2021   IR PARACENTESIS  09/13/2021   IR PARACENTESIS  10/02/2021   IR PARACENTESIS  10/22/2021   IR PARACENTESIS  11/01/2021   IR PARACENTESIS  11/29/2021   IR PARACENTESIS  12/18/2021   IR PARACENTESIS  01/02/2022   IR RADIOLOGIST EVAL & MGMT  12/23/2018   IR RADIOLOGIST EVAL & MGMT  10/11/2021   IR RADIOLOGIST EVAL & MGMT  12/27/2021   IR THORACENTESIS ASP PLEURAL SPACE W/IMG GUIDE  12/10/2018   IR THORACENTESIS ASP PLEURAL SPACE W/IMG GUIDE  01/21/2019   IR THORACENTESIS ASP PLEURAL SPACE W/IMG GUIDE  10/23/2021   IR THORACENTESIS ASP PLEURAL SPACE W/IMG GUIDE  10/25/2021   IR THORACENTESIS ASP PLEURAL SPACE W/IMG GUIDE  11/01/2021   IR THORACENTESIS ASP PLEURAL SPACE W/IMG GUIDE  01/03/2022   IR THROMBECT VENO MECH MOD SED  01/02/2022   IR TIPS  11/01/2021   IR TIPS REVISION MOD SED  01/02/2022   IR TRANSCATH RETRIEVAL FB INCL GUIDANCE (MS)  01/02/2022   IR US GUIDE VASC ACCESS RIGHT  11/01/2021   IR US GUIDE VASC ACCESS RIGHT  11/01/2021   IR US GUIDE VASC ACCESS RIGHT  11/01/2021   IR US GUIDE VASC ACCESS RIGHT  01/02/2022   RADIOLOGY WITH ANESTHESIA N/A 11/01/2021   Procedure: TIPS;  Surgeon: Sterling Big, MD;  Location: [facility] OR;  Service: Radiology;  Laterality: N/A;    OB History   No obstetric history on file.      Home Medications    Prior to Admission medications   Medication Sig Start Date End Date Taking? Authorizing Provider  acetaminophen (TYLENOL) 500 MG tablet Take 500-1,000 mg by mouth daily as needed for mild pain, headache or fever.    [provider]  diclofenac Sodium (VOLTAREN) 1 % GEL Apply 2 g topically 4 (four) times daily as needed. 10/08/22   Tilden Fossa, MD  lactulose (CHRONULAC) 10 GM/15ML solution Take 45 mLs (30 g total) by mouth 2 (two) times daily.  06/09/22   Deeann Saint, MD  Multiple Vitamin (MULTIVITAMIN WITH MINERALS) TABS tablet Take 1 tablet by mouth daily. 04/25/22   Glade Lloyd, MD  rifaximin (XIFAXAN) 550 MG TABS tablet TAKE 1 TABLET BY MOUTH TWICE DAILY. 09/29/22   Armbruster, Willaim Rayas, MD  thiamine (VITAMIN B-1) 100 MG tablet Take 1 tablet (100 mg total) by mouth daily. Patient not taking: Reported on 10/29/2022 04/25/22   Glade Lloyd, MD  tizanidine (ZANAFLEX) 2 MG capsule Take 1 capsule (2 mg total) by mouth 2 (two) times daily. Patient not taking: Reported on 10/29/2022 10/08/22   Tilden Fossa, MD    Family History Family History  Problem Relation Age of Onset   Colon cancer Neg Hx    Esophageal cancer Neg Hx    Pancreatic cancer Neg Hx    Stomach cancer  Neg Hx     Social History Social History   Tobacco Use   Smoking status: Former    Packs/day: 0.25    Years: 20.00    Additional pack years: 0.00    Total pack years: 5.00    Types: Cigarettes    Quit date: 07/24/2022    Years since quitting: 0.2   Smokeless tobacco: Never   Tobacco comments:    5 a day  Vaping Use   Vaping Use: Former  Substance Use Topics   Alcohol use: Not Currently   Drug use: Not Currently     Allergies   Contrast media [iodinated contrast media], Latex, and Sulfa antibiotics   Review of Systems Review of Systems  Constitutional:  Positive for activity change. Negative for appetite change, fatigue and fever.  Respiratory:  Negative for cough and shortness of breath.   Cardiovascular:  Negative for chest pain.  Gastrointestinal:  Positive for abdominal pain. Negative for diarrhea, nausea and vomiting.  Genitourinary:  Negative for dysuria, frequency and urgency.  Musculoskeletal:  Positive for back pain. Negative for arthralgias and myalgias.     Physical Exam Triage Vital Signs ED Triage Vitals  Enc Vitals Group     BP 10/29/22 1603 (!) 146/90     Pulse Rate 10/29/22 1603 85     Resp 10/29/22 1603 (!) 22      Temp 10/29/22 1603 98.3 F (36.8 C)     Temp Source 10/29/22 1603 Oral     SpO2 10/29/22 1603 93 %     Weight --      Height --      Head Circumference --      Peak Flow --      Pain Score 10/29/22 1600 8     Pain Loc --      Pain Edu? --      Excl. in GC? --    No data found.  Updated Vital Signs BP (!) 146/90 (BP Location: Left Arm)   Pulse 85   Temp 98.3 F (36.8 C) (Oral)   Resp (!) 22   SpO2 93%   Visual Acuity Right Eye Distance:   Left Eye Distance:   Bilateral Distance:    Right Eye Near:   Left Eye Near:    Bilateral Near:     Physical Exam Vitals reviewed.  Constitutional:      General: She is awake. She is not in acute distress.    Appearance: Normal appearance. She is well-developed. She is ill-appearing.     Comments: Very pleasant female appears stated age in no acute distress sitting comfortably in exam room  HENT:     Head: Normocephalic and atraumatic.  Cardiovascular:     Rate and Rhythm: Normal rate and regular rhythm.     Heart sounds: Normal heart sounds, S1 normal and S2 normal. No murmur heard. Pulmonary:     Effort: Pulmonary effort is normal.     Breath sounds: Normal breath sounds. No wheezing, rhonchi or rales.     Comments: Clear to auscultation bilaterally Abdominal:     General: Bowel sounds are normal.     Palpations: Abdomen is soft.     Tenderness: There is abdominal tenderness in the right upper quadrant, right lower quadrant and epigastric area. There is right CVA tenderness and guarding. There is no left CVA tenderness or rebound. Positive signs include Murphy's sign.     Comments: Significant tenderness palpation in right abdomen with guarding of her  right upper quadrant and positive Murphy sign.  Psychiatric:        Behavior: Behavior is cooperative.      UC Treatments / Results  Labs (all labs ordered are listed, but only abnormal results are displayed) Labs Reviewed  POCT URINALYSIS DIP (MANUAL ENTRY) - Abnormal;  Notable for the following components:      Result Value   Clarity, UA hazy (*)    All other components within normal limits    EKG   Radiology No results found.  Procedures Procedures (including critical care time)  Medications Ordered in UC Medications - No data to display  Initial Impression / Assessment and Plan / UC Course  I have reviewed the triage vital signs and the nursing notes.  Pertinent labs & imaging results that were available during my care of the patient were reviewed by me and considered in my medical decision making (see chart for details).     Patient is ill-appearing and slightly tachypneic but otherwise in no acute distress.  Urine was obtained that was hazy but with no evidence of acute infection or blood concerning for nephrolithiasis.  Given her significant abdominal tenderness recommended that she go to the emergency room for imaging and stat labs since we do not have these capabilities in urgent care.  Patient is agreeable and will go to [facility], ER for further evaluation and management.  She was stable at time of discharge and her significant other who accompanied her to the visit today will transport her by private vehicle.  Final Clinical Impressions(s) / UC Diagnoses   Final diagnoses:  RUQ abdominal pain  Right flank pain  Cirrhosis of liver without ascites, unspecified hepatic cirrhosis type (HCC)  Abdominal guarding     Discharge Instructions      Please go to the emergency room for further evaluation and management.     ED Prescriptions   None    PDMP not reviewed this encounter.   Jeani Hawking, PA-C 10/29/22 1633

## 2022-10-29 NOTE — ED Triage Notes (Signed)
Patient points to right flank as pain from her cirrhosis of the liver.  Has had pain for 3 days.  Patient reports this pain is worse than usual.

## 2022-10-29 NOTE — Progress Notes (Signed)
Subjective:   Katie Holland is a 57 y.o. female who presents for Medicare Annual (Subsequent) preventive examination.  Review of Systems    Virtual Visit via Telephone Note  I connected with  Katie Holland on 10/29/22 at 11:00 AM EDT by telephone and verified that I am speaking with the correct person using two identifiers.  Location: Patient: Home Provider: Office Persons participating in the virtual visit: patient/Nurse Health Advisor   I discussed the limitations, risks, security and privacy concerns of performing an evaluation and management service by telephone and the availability of in person appointments. The patient expressed understanding and agreed to proceed.  Interactive audio and video telecommunications were attempted between this nurse and patient, however failed, due to patient having technical difficulties OR patient did not have access to video capability.  We continued and completed visit with audio only.  Some vital signs may be absent or patient reported.   Tillie Rung, LPN  Cardiac Risk Factors include: advanced age (>31men, >24 women);smoking/ tobacco exposure     Objective:    Today's Vitals   10/29/22 1107 10/29/22 1111  Weight: 127 lb (57.6 kg)   Height: 5\' 4"  (1.626 m)   PainSc:  0-No pain   Body mass index is 21.8 kg/m.     10/29/2022   11:27 AM 01/02/2022    8:04 AM 11/19/2021    1:47 PM 11/01/2021    8:14 AM 10/23/2021    8:05 PM 10/22/2021    2:31 PM 08/22/2021    7:11 AM  Advanced Directives  Does Patient Have a Medical Advance Directive? No No No No  No No  Would patient like information on creating a medical advance directive? No - Patient declined No - Patient declined No - Patient declined No - Patient declined No - Patient declined      Current Medications (verified) Outpatient Encounter Medications as of 10/29/2022  Medication Sig   acetaminophen (TYLENOL) 500 MG tablet Take 500-1,000 mg by mouth daily as needed for mild pain, headache  or fever.   diclofenac Sodium (VOLTAREN) 1 % GEL Apply 2 g topically 4 (four) times daily as needed.   lactulose (CHRONULAC) 10 GM/15ML solution Take 45 mLs (30 g total) by mouth 2 (two) times daily.   Multiple Vitamin (MULTIVITAMIN WITH MINERALS) TABS tablet Take 1 tablet by mouth daily.   rifaximin (XIFAXAN) 550 MG TABS tablet TAKE 1 TABLET BY MOUTH TWICE DAILY.   thiamine (VITAMIN B-1) 100 MG tablet Take 1 tablet (100 mg total) by mouth daily.   tizanidine (ZANAFLEX) 2 MG capsule Take 1 capsule (2 mg total) by mouth 2 (two) times daily.   Facility-Administered Encounter Medications as of 10/29/2022  Medication   lactated ringers infusion   lactated ringers infusion    Allergies (verified) Contrast media [iodinated contrast media], Latex, and Sulfa antibiotics   History: Past Medical History:  Diagnosis Date   Alcoholic liver disease (HCC)    Chronic back pain    on disability   Cirrhosis of liver (HCC)    Esophageal varices (HCC)    GERD (gastroesophageal reflux disease)    Headache    HEPATIC Hydrothorax    S/P tips   Jaundice    Past Surgical History:  Procedure Laterality Date   BACK SURGERY     L3-L4   BIOPSY  08/07/2020   Procedure: BIOPSY;  Surgeon: Benancio Deeds, MD;  Location: [facility] ENDOSCOPY;  Service: Gastroenterology;;   COLONOSCOPY WITH PROPOFOL N/A 08/01/2019  Procedure: COLONOSCOPY WITH PROPOFOL;  Surgeon: Benancio Deeds, MD;  Location: [facility];  Service: Gastroenterology;  Laterality: N/A;   ESOPHAGEAL BANDING  04/25/2018   Procedure: ESOPHAGEAL BANDING;  Surgeon: Benancio Deeds, MD;  Location: [facility] ENDOSCOPY;  Service: Gastroenterology;;   ESOPHAGEAL BANDING  04/27/2018   Procedure: ESOPHAGEAL BANDING;  Surgeon: Rachael Fee, MD;  Location: [facility] ENDOSCOPY;  Service: Endoscopy;;   ESOPHAGEAL BANDING  06/02/2018   Procedure: ESOPHAGEAL BANDING;  Surgeon: Sherrilyn Rist, MD;  Location: [facility] ENDOSCOPY;  Service: Gastroenterology;;    ESOPHAGEAL BANDING N/A 07/07/2018   Procedure: ESOPHAGEAL BANDING;  Surgeon: Benancio Deeds, MD;  Location: [facility]OS[facility];  Service: Gastroenterology;  Laterality: N/A;   ESOPHAGEAL BANDING  07/27/2018   Procedure: ESOPHAGEAL BANDING;  Surgeon: Benancio Deeds, MD;  Location: [facility]OS[facility];  Service: Gastroenterology;;   ESOPHAGEAL BANDING  08/30/2018   Procedure: ESOPHAGEAL BANDING;  Surgeon: Benancio Deeds, MD;  Location: [facility] ENDOSCOPY;  Service: Gastroenterology;;   ESOPHAGOGASTRODUODENOSCOPY N/A 04/27/2018   Procedure: ESOPHAGOGASTRODUODENOSCOPY (EGD);  Surgeon: Rachael Fee, MD;  Location: [facility] ENDOSCOPY;  Service: Endoscopy;  Laterality: N/A;   ESOPHAGOGASTRODUODENOSCOPY (EGD) WITH PROPOFOL N/A 04/25/2018   Procedure: ESOPHAGOGASTRODUODENOSCOPY (EGD) WITH PROPOFOL;  Surgeon: Benancio Deeds, MD;  Location: [facility] ENDOSCOPY;  Service: Gastroenterology;  Laterality: N/A;   ESOPHAGOGASTRODUODENOSCOPY (EGD) WITH PROPOFOL N/A 06/02/2018   Procedure: ESOPHAGOGASTRODUODENOSCOPY (EGD) WITH PROPOFOL;  Surgeon: Sherrilyn Rist, MD;  Location: [facility] ENDOSCOPY;  Service: Gastroenterology;  Laterality: N/A;   ESOPHAGOGASTRODUODENOSCOPY (EGD) WITH PROPOFOL N/A 07/07/2018   Procedure: ESOPHAGOGASTRODUODENOSCOPY (EGD) WITH PROPOFOL;  Surgeon: Benancio Deeds, MD;  Location: [facility] ENDOSCOPY;  Service: Gastroenterology;  Laterality: N/A;   ESOPHAGOGASTRODUODENOSCOPY (EGD) WITH PROPOFOL N/A 07/27/2018   Procedure: ESOPHAGOGASTRODUODENOSCOPY (EGD) WITH PROPOFOL;  Surgeon: Benancio Deeds, MD;  Location: [facility] ENDOSCOPY;  Service: Gastroenterology;  Laterality: N/A;   ESOPHAGOGASTRODUODENOSCOPY (EGD) WITH PROPOFOL N/A 08/30/2018   Procedure: ESOPHAGOGASTRODUODENOSCOPY (EGD) WITH PROPOFOL;  Surgeon: Benancio Deeds, MD;  Location: [facility] ENDOSCOPY;  Service: Gastroenterology;  Laterality: N/A;   ESOPHAGOGASTRODUODENOSCOPY (EGD) WITH PROPOFOL N/A 11/08/2018   Procedure: ESOPHAGOGASTRODUODENOSCOPY (EGD)  WITH PROPOFOL;  Surgeon: Benancio Deeds, MD;  Location: [facility] ENDOSCOPY;  Service: Gastroenterology;  Laterality: N/A;   ESOPHAGOGASTRODUODENOSCOPY (EGD) WITH PROPOFOL N/A 01/31/2019   Procedure: ESOPHAGOGASTRODUODENOSCOPY (EGD) WITH PROPOFOL;  Surgeon: Benancio Deeds, MD;  Location: [facility] END[facility];  Service: Gastroenterology;  Laterality: N/A;   ESOPHAGOGASTRODUODENOSCOPY (EGD) WITH PROPOFOL N/A 08/01/2019   Procedure: ESOPHAGOGASTRODUODENOSCOPY (EGD) WITH PROPOFOL;  Surgeon: Benancio Deeds, MD;  Location: [facility] END[facility];  Service: Gastroenterology;  Laterality: N/A;   ESOPHAGOGASTRODUODENOSCOPY (EGD) WITH PROPOFOL N/A 08/07/2020   Procedure: ESOPHAGOGASTRODUODENOSCOPY (EGD) WITH PROPOFOL;  Surgeon: Benancio Deeds, MD;  Location: [facility] ENDOSCOPY;  Service: Gastroenterology;  Laterality: N/A;   ESOPHAGOGASTRODUODENOSCOPY (EGD) WITH PROPOFOL N/A 08/22/2021   Procedure: ESOPHAGOGASTRODUODENOSCOPY (EGD) WITH PROPOFOL;  Surgeon: Benancio Deeds, MD;  Location: [facility] ENDOSCOPY;  Service: Gastroenterology;  Laterality: N/A;   IR EMBO ART  VEN HEMORR LYMPH EXTRAV  INC GUIDE ROADMAPPING  01/02/2022   IR INTRAVASCULAR ULTRASOUND NON CORONARY  11/05/2021   IR PARACENTESIS  04/26/2018   IR PARACENTESIS  05/18/2018   IR PARACENTESIS  05/31/2018   IR PARACENTESIS  06/30/2018   IR PARACENTESIS  08/05/2018   IR PARACENTESIS  08/31/2018   IR PARACENTESIS  10/08/2018   IR PARACENTESIS  10/28/2018   IR PARACENTESIS  02/14/2019   IR PARACENTESIS  03/09/2019   IR PARACENTESIS  04/05/2019  IR PARACENTESIS  05/03/2019   IR PARACENTESIS  06/20/2019   IR PARACENTESIS  07/19/2019   IR PARACENTESIS  08/19/2019   IR PARACENTESIS  09/27/2019   IR PARACENTESIS  10/07/2019   IR PARACENTESIS  10/28/2019   IR PARACENTESIS  11/16/2019   IR PARACENTESIS  12/30/2019   IR PARACENTESIS  01/16/2020   IR PARACENTESIS  02/07/2020   IR PARACENTESIS  03/06/2020   IR PARACENTESIS  04/04/2020   IR PARACENTESIS  04/17/2020   IR  PARACENTESIS  05/04/2020   IR PARACENTESIS  06/07/2020   IR PARACENTESIS  06/28/2020   IR PARACENTESIS  07/27/2020   IR PARACENTESIS  08/20/2020   IR PARACENTESIS  09/13/2020   IR PARACENTESIS  10/04/2020   IR PARACENTESIS  10/25/2020   IR PARACENTESIS  11/07/2020   IR PARACENTESIS  11/30/2020   IR PARACENTESIS  12/13/2020   IR PARACENTESIS  01/04/2021   IR PARACENTESIS  01/25/2021   IR PARACENTESIS  02/20/2021   IR PARACENTESIS  03/20/2021   IR PARACENTESIS  04/18/2021   IR PARACENTESIS  05/14/2021   IR PARACENTESIS  06/10/2021   IR PARACENTESIS  06/27/2021   IR PARACENTESIS  07/17/2021   IR PARACENTESIS  08/12/2021   IR PARACENTESIS  08/23/2021   IR PARACENTESIS  09/13/2021   IR PARACENTESIS  10/02/2021   IR PARACENTESIS  10/22/2021   IR PARACENTESIS  11/01/2021   IR PARACENTESIS  11/29/2021   IR PARACENTESIS  12/18/2021   IR PARACENTESIS  01/02/2022   IR RADIOLOGIST EVAL & MGMT  12/23/2018   IR RADIOLOGIST EVAL & MGMT  10/11/2021   IR RADIOLOGIST EVAL & MGMT  12/27/2021   IR THORACENTESIS ASP PLEURAL SPACE W/IMG GUIDE  12/10/2018   IR THORACENTESIS ASP PLEURAL SPACE W/IMG GUIDE  01/21/2019   IR THORACENTESIS ASP PLEURAL SPACE W/IMG GUIDE  10/23/2021   IR THORACENTESIS ASP PLEURAL SPACE W/IMG GUIDE  10/25/2021   IR THORACENTESIS ASP PLEURAL SPACE W/IMG GUIDE  11/01/2021   IR THORACENTESIS ASP PLEURAL SPACE W/IMG GUIDE  01/03/2022   IR THROMBECT VENO MECH MOD SED  01/02/2022   IR TIPS  11/01/2021   IR TIPS REVISION MOD SED  01/02/2022   IR TRANSCATH RETRIEVAL FB INCL GUIDANCE (MS)  01/02/2022   IR US GUIDE VASC ACCESS RIGHT  11/01/2021   IR US GUIDE VASC ACCESS RIGHT  11/01/2021   IR US GUIDE VASC ACCESS RIGHT  11/01/2021   IR US GUIDE VASC ACCESS RIGHT  01/02/2022   RADIOLOGY WITH ANESTHESIA N/A 11/01/2021   Procedure: TIPS;  Surgeon: Sterling Big, MD;  Location: [facility] OR;  Service: Radiology;  Laterality: N/A;   Family History  Problem Relation Age of Onset   Colon cancer Neg Hx    Esophageal cancer Neg Hx     Pancreatic cancer Neg Hx    Stomach cancer Neg Hx    Social History   Socioeconomic History   Marital status: Married    Spouse name: Not on file   Number of children: 8   Years of education: Not on file   Highest education level: Not on file  Occupational History   Occupation: disability  Tobacco Use   Smoking status: Former    Packs/day: 0.25    Years: 20.00    Additional pack years: 0.00    Total pack years: 5.00    Types: Cigarettes    Quit date: 07/24/2022    Years since quitting: 0.2   Smokeless tobacco: Never   Tobacco  comments:    5 a day  Vaping Use   Vaping Use: Former  Substance and Sexual Activity   Alcohol use: Not Currently   Drug use: Not Currently   Sexual activity: Not Currently  Other Topics Concern   Not on file  Social History Narrative   Not on file   Social Determinants of Health   Financial Resource Strain: Low Risk  (10/29/2022)   Overall Financial Resource Strain (CARDIA)    Difficulty of Paying Living Expenses: Not hard at all  Food Insecurity: No Food Insecurity (10/29/2022)   Hunger Vital Sign    Worried About Running Out of Food in the Last Year: Never true    Ran Out of Food in the Last Year: Never true  Transportation Needs: No Transportation Needs (10/29/2022)   PRAPARE - Administrator, Civil Service (Medical): No    Lack of Transportation (Non-Medical): No  Physical Activity: Inactive (10/29/2022)   Exercise Vital Sign    Days of Exercise per Week: 0 days    Minutes of Exercise per Session: 0 min  Stress: No Stress Concern Present (10/29/2022)   [employer] of Occupational Health - Occupational Stress Questionnaire    Feeling of Stress : Not at all  Social Connections: Moderately Integrated (10/29/2022)   Social Connection and Isolation Panel [NHANES]    Frequency of Communication with Friends and Family: More than three times a week    Frequency of Social Gatherings with Friends and Family: More than three times a week     Attends Religious Services: More than 4 times per year    Active Member of [employer] or Organizations: Yes    Attends Engineer, structural: More than 4 times per year    Marital Status: Never married    Tobacco Counseling Counseling given: Yes Tobacco comments: 5 a day   Clinical Intake:  Pre-visit preparation completed: Yes  Pain : No/denies pain Pain Score: 0-No pain     BMI - recorded: 21.8 Nutritional Status: BMI of 19-24  Normal Nutritional Risks: None Diabetes: No  How often do you need to have someone help you when you read instructions, pamphlets, or other written materials from your doctor or pharmacy?: 1 - Never  Diabetic?  No  Interpreter Needed?: No  Information entered by :: Theresa Mulligan LPN   Activities of Daily Living    10/29/2022   11:22 AM  In your present state of health, do you have any difficulty performing the following activities:  Hearing? 0  Vision? 0  Difficulty concentrating or making decisions? 0  Walking or climbing stairs? 0  Dressing or bathing? 0  Doing errands, shopping? 0  Preparing Food and eating ? N  Using the Toilet? N  In the past six months, have you accidently leaked urine? Y  Comment Followed by PCP  Do you have problems with loss of bowel control? N  Managing your Medications? N  Managing your Finances? N  Housekeeping or managing your Housekeeping? N    Patient Care Team: Deeann Saint, MD as PCP - General (Family Medicine)  Indicate any recent Medical Services you may have received from other than [facility] providers in the past year (date may be approximate).     Assessment:   This is a routine wellness examination for [place].  Hearing/Vision screen Hearing Screening - Comments:: Denies hearing difficulties   Vision Screening - Comments:: Wears rx glasses - up to date with routine eye exams with  [facility]  Dietary issues and exercise activities discussed: Exercise limited by: None  identified   Goals Addressed               This Visit's Progress     Patient Stated (pt-stated)        I want to take daily walks       Depression Screen    10/29/2022   11:22 AM 06/09/2022    2:39 PM 03/17/2022    4:10 PM 12/18/2020    1:52 PM  PHQ 2/9 Scores  PHQ - 2 Score 0 0  0  Exception Documentation   Patient refusal     Fall Risk    10/29/2022   11:24 AM 12/18/2020    1:55 PM  Fall Risk   Falls in the past year? 1 0  Number falls in past yr: 0 0  Injury with Fall? 1 0  Comment Swelling to rt hip. Followed by medical attention   Risk for fall due to : Orthopedic patient Impaired vision;Impaired balance/gait;Impaired mobility  Follow up Falls prevention discussed Falls prevention discussed    FALL RISK PREVENTION PERTAINING TO THE HOME:  Any stairs in or around the home? No  If so, are there any without handrails? No  Home free of loose throw rugs in walkways, pet beds, electrical cords, etc? Yes  Adequate lighting in your home to reduce risk of falls? Yes   ASSISTIVE DEVICES UTILIZED TO PREVENT FALLS:  Life alert? No  Use of a cane, walker or w/c? Yes  Grab bars in the bathroom? Yes  Shower chair or bench in shower? Yes  Elevated toilet seat or a handicapped toilet? No   TIMED UP AND GO:  Was the test performed? No . Audio Visit   Cognitive Function:        10/29/2022   11:27 AM 12/18/2020    1:58 PM  6CIT Screen  What Year? 0 points 4 points  What month? 0 points 0 points  What time? 0 points 0 points  Count back from 20 0 points 0 points  Months in reverse 0 points 0 points  Repeat phrase 0 points 2 points  Total Score 0 points 6 points    Immunizations Immunization History  Administered Date(s) Administered   Hepatitis A, Adult 06/29/2018, 12/29/2018   PFIZER(Purple Top)SARS-COV-2 Vaccination 12/02/2019, 12/23/2019, 06/20/2020   PNEUMOCOCCAL CONJUGATE-20 10/26/2021    TDAP status: Due, Education has been provided regarding the  importance of this vaccine. Advised may receive this vaccine at local pharmacy or Health Dept. Aware to provide a copy of the vaccination record if obtained from local pharmacy or Health Dept. Verbalized acceptance and understanding.  Flu Vaccine status: Up to date    Covid-19 vaccine status: Completed vaccines  Qualifies for Shingles Vaccine? Yes   Zostavax completed No   Shingrix Completed?: No.    Education has been provided regarding the importance of this vaccine. Patient has been advised to call insurance company to determine out of pocket expense if they have not yet received this vaccine. Advised may also receive vaccine at local pharmacy or Health Dept. Verbalized acceptance and understanding.  Screening Tests Health Maintenance  Topic Date Due   DTaP/Tdap/Td (1 - Tdap) Never done   PAP SMEAR-Modifier  Never done   MAMMOGRAM  Never done   COVID-19 Vaccine (4 - 2023-24 season) 11/14/2022 (Originally 02/21/2022)   Zoster Vaccines- Shingrix (1 of 2) 01/29/2023 (Originally [birth year]1983)   INFLUENZA VACCINE  01/22/2023  Medicare Annual Wellness (AWV)  10/29/2023   COLONOSCOPY (Pts 45-80yrs Insurance coverage will need to be confirmed)  07/31/2029   Hepatitis C Screening  Completed   HIV Screening  Completed   HPV VACCINES  Aged Out    Health Maintenance  Health Maintenance Due  Topic Date Due   DTaP/Tdap/Td (1 - Tdap) Never done   PAP SMEAR-Modifier  Never done   MAMMOGRAM  Never done    Colorectal cancer screening: Type of screening: Colonoscopy. Completed 08/01/19. Repeat every 10 years  Mammogram status: Ordered 10/29/22. Pt provided with contact info and advised to call to schedule appt.     Lung Cancer Screening: (Low Dose CT Chest recommended if Age 48-80 years, 30 pack-year currently smoking OR have quit w/in 15years.) does qualify.   Lung Cancer Screening Referral: 10/29/22  Additional Screening:  Hepatitis C Screening: does qualify; Completed 10/23/21  Vision  Screening: Recommended annual ophthalmology exams for early detection of glaucoma and other disorders of the eye. Is the patient up to date with their annual eye exam?  Yes  Who is the provider or what is the name of the office in which the patient attends annual eye exams? [facility] If pt is not established with a provider, would they like to be referred to a provider to establish care? No .   Dental Screening: Recommended annual dental exams for proper oral hygiene  Community Resource Referral / Chronic Care Management:  CRR required this visit?  No   CCM required this visit?  No      Plan:     I have personally reviewed and noted the following in the patient's chart:   Medical and social history Use of alcohol, tobacco or illicit drugs  Current medications and supplements including opioid prescriptions. Patient is not currently taking opioid prescriptions. Functional ability and status Nutritional status Physical activity Advanced directives List of other physicians Hospitalizations, surgeries, and ER visits in previous 12 months Vitals Screenings to include cognitive, depression, and falls Referrals and appointments  In addition, I have reviewed and discussed with patient certain preventive protocols, quality metrics, and best practice recommendations. A written personalized care plan for preventive services as well as general preventive health recommendations were provided to patient.     Tillie Rung, LPN   09/25/4096   Nurse Notes:   None

## 2022-10-29 NOTE — ED Notes (Signed)
Patient is being discharged from the Urgent Care and sent to the Emergency Department via POV . Per Dorann Ou, PA, patient is in need of higher level of care due to abdominal pain, guarding. Patient is aware and verbalizes understanding of plan of care.  Vitals:   10/29/22 1603  BP: (!) 146/90  Pulse: 85  Resp: (!) 22  Temp: 98.3 F (36.8 C)  SpO2: 93%

## 2022-10-29 NOTE — Discharge Instructions (Signed)
Please go to the emergency room for further evaluation and management. 

## 2022-11-06 ENCOUNTER — Other Ambulatory Visit: Payer: Self-pay

## 2022-11-06 ENCOUNTER — Emergency Department (HOSPITAL_COMMUNITY): Payer: 59

## 2022-11-06 ENCOUNTER — Encounter (HOSPITAL_COMMUNITY): Payer: Self-pay | Admitting: *Deleted

## 2022-11-06 ENCOUNTER — Emergency Department (HOSPITAL_COMMUNITY)
Admission: EM | Admit: 2022-11-06 | Discharge: 2022-11-06 | Disposition: A | Discharge status: Home / Self Care | Payer: 59 | Attending: Emergency Medicine | Admitting: Emergency Medicine

## 2022-11-06 DIAGNOSIS — Z91040 Latex allergy status: Secondary | ICD-10-CM | POA: Diagnosis not present

## 2022-11-06 DIAGNOSIS — R1011 Right upper quadrant pain: Secondary | ICD-10-CM | POA: Diagnosis not present

## 2022-11-06 DIAGNOSIS — R110 Nausea: Secondary | ICD-10-CM | POA: Diagnosis not present

## 2022-11-06 DIAGNOSIS — R188 Other ascites: Secondary | ICD-10-CM | POA: Diagnosis not present

## 2022-11-06 DIAGNOSIS — K7460 Unspecified cirrhosis of liver: Secondary | ICD-10-CM | POA: Diagnosis not present

## 2022-11-06 DIAGNOSIS — J9811 Atelectasis: Secondary | ICD-10-CM | POA: Diagnosis not present

## 2022-11-06 DIAGNOSIS — J90 Pleural effusion, not elsewhere classified: Secondary | ICD-10-CM | POA: Diagnosis not present

## 2022-11-06 DIAGNOSIS — K8020 Calculus of gallbladder without cholecystitis without obstruction: Secondary | ICD-10-CM | POA: Diagnosis not present

## 2022-11-06 LAB — COMPREHENSIVE METABOLIC PANEL
ALT: 38 U/L (ref 0–44)
AST: 53 U/L — ABNORMAL HIGH (ref 15–41)
Albumin: 3.4 g/dL — ABNORMAL LOW (ref 3.5–5.0)
Alkaline Phosphatase: 146 U/L — ABNORMAL HIGH (ref 38–126)
Anion gap: 11 (ref 5–15)
BUN: 9 mg/dL (ref 6–20)
CO2: 21 mmol/L — ABNORMAL LOW (ref 22–32)
Calcium: 9.6 mg/dL (ref 8.9–10.3)
Chloride: 106 mmol/L (ref 98–111)
Creatinine, Ser: 0.79 mg/dL (ref 0.44–1.00)
GFR, Estimated: >60 mL/min (ref >60)
Glucose, Bld: 88 mg/dL (ref 70–99)
Potassium: 3.5 mmol/L (ref 3.5–5.1)
Sodium: 138 mmol/L (ref 135–145)
Total Bilirubin: 1.4 mg/dL — ABNORMAL HIGH (ref 0.3–1.2)
Total Protein: 6.8 g/dL (ref 6.5–8.1)

## 2022-11-06 LAB — URINALYSIS, ROUTINE W REFLEX MICROSCOPIC
Bacteria, UA: NONE SEEN
Bilirubin Urine: NEGATIVE
Glucose, UA: NEGATIVE mg/dL
Ketones, ur: NEGATIVE mg/dL
Leukocytes,Ua: NEGATIVE
Nitrite: NEGATIVE
Protein, ur: NEGATIVE mg/dL
Specific Gravity, Urine: 1.015 (ref 1.005–1.030)
pH: 6 (ref 5.0–8.0)

## 2022-11-06 LAB — CBC WITH DIFFERENTIAL/PLATELET
Abs Immature Granulocytes: 0.01 10*3/uL (ref 0.00–0.07)
Basophils Absolute: 0 10*3/uL (ref 0.0–0.1)
Basophils Relative: 1 %
Eosinophils Absolute: 0.4 10*3/uL (ref 0.0–0.5)
Eosinophils Relative: 8 %
HCT: 44.9 % (ref 36.0–46.0)
Hemoglobin: 13.9 g/dL (ref 12.0–15.0)
Immature Granulocytes: 0 %
Lymphocytes Relative: 31 %
Lymphs Abs: 1.4 10*3/uL (ref 0.7–4.0)
MCH: 25.7 pg — ABNORMAL LOW (ref 26.0–34.0)
MCHC: 31 g/dL (ref 30.0–36.0)
MCV: 83.1 fL (ref 80.0–100.0)
Monocytes Absolute: 0.4 10*3/uL (ref 0.1–1.0)
Monocytes Relative: 9 %
Neutro Abs: 2.4 10*3/uL (ref 1.7–7.7)
Neutrophils Relative %: 51 %
Platelets: 115 10*3/uL — ABNORMAL LOW (ref 150–400)
RBC: 5.4 MIL/uL — ABNORMAL HIGH (ref 3.87–5.11)
RDW: 17.9 % — ABNORMAL HIGH (ref 11.5–15.5)
WBC: 4.6 10*3/uL (ref 4.0–10.5)
nRBC: 0 % (ref 0.0–0.2)

## 2022-11-06 LAB — LIPASE, BLOOD: Lipase: 22 U/L (ref 11–51)

## 2022-11-06 MED ORDER — OXYCODONE HCL 5 MG PO TABS
5.0000 mg | ORAL_TABLET | Freq: Once | ORAL | Status: AC
  Administered 2022-11-06: 5 mg via ORAL
  Filled 2022-11-06: qty 1

## 2022-11-06 NOTE — ED Provider Triage Note (Signed)
Emergency Medicine Provider Triage Evaluation Note  Jilliann Ille , a 57 y.o. female  was evaluated in triage.  Pt complains of right upper abdominal pain, right flank pain.  Patient states that symptoms been intermittent since prior TIPS procedure performed in 2020.  Reports pain is worsened with movement.  Denies fever, nausea, vomiting, urinary symptoms, change in bowel habits from baseline.  Review of Systems  Positive: See above Negative:   Physical Exam  BP (!) 170/99 (BP Location: Right Arm)   Pulse 83   Temp 98.5 F (36.9 C) (Oral)   Resp 16   SpO2 97%  Gen:   Awake, no distress   Resp:  Normal effort  MSK:   Moves extremities without difficulty  Other:   Medical Decision Making  Medically screening exam initiated at 2:36 PM.  Appropriate orders placed.  Giordana Heinkel was informed that the remainder of the evaluation will be completed by another provider, this initial triage assessment does not replace that evaluation, and the importance of remaining in the ED until their evaluation is complete.     Peter Garter, [place] 11/06/22 1440

## 2022-11-06 NOTE — ED Triage Notes (Signed)
Pt with hx of cirrhosis is here for back and flank pain.

## 2022-11-06 NOTE — Discharge Instructions (Signed)
You have been seen today for your complaint of abdominal pain. Your lab work was overall reassuring. Your imaging showed gallstones but was otherwise reassuring. Your discharge medications include ibuprofen. You may take up to 600 mg every 8 hours for pain. Follow up with: your surgeon and stomach doctor as soon as possible. Please seek immediate medical care if you develop any of the following symptoms: You have pain in your abdomen, that: Lasts more than 5 hours. Keeps getting worse. You have a fever or chills. You can't stop vomiting. Your skin or the white parts of your eyes turn yellow. At this time there does not appear to be the presence of an emergent medical condition, however there is always the potential for conditions to change. Please read and follow the below instructions.  Do not take your medicine if  develop an itchy rash, swelling in your mouth or lips, or difficulty breathing; call 911 and seek immediate emergency medical attention if this occurs.  You may review your lab tests and imaging results in their entirety on your MyChart account.  Please discuss all results of fully with your primary care provider and other specialist at your follow-up visit.  Note: Portions of this text may have been transcribed using voice recognition software. Every effort was made to ensure accuracy; however, inadvertent computerized transcription errors may still be present.

## 2022-11-06 NOTE — ED Provider Notes (Signed)
[place] EMERGENCY DEPARTMENT AT [facility] Provider Note   CSN: [id number] Arrival date & time: 11/06/22  1407     History  Chief Complaint  Patient presents with   Back Pain   Flank Pain    Katie Holland is a 57 y.o. female.  With a history of cirrhosis status post TIPS in 2020, chronic back pain who presents to the ED for evaluation of right upper quadrant abdominal pain.  This began approximately 1 week ago.  She has had numerous episodes of similar after her TIPS procedure in 2020 but states this is the worst pain she has had.  It is worse with movement.  Improved with rest.  She has had some mild intermittent nausea but denies vomiting.  Also endorses some loose stools over the past week.  She denies fevers, chills, dysuria, frequency, urgency, chest pain, shortness of breath.  She has taken tizanidine at home which she states is contributing to her nausea and is not helping with her symptoms.  She has not taken anything else for her pain.   Back Pain Flank Pain       Home Medications Prior to Admission medications   Medication Sig Start Date End Date Taking? Authorizing Provider  acetaminophen (TYLENOL) 500 MG tablet Take 500-1,000 mg by mouth daily as needed for mild pain, headache or fever.    [provider]  diclofenac Sodium (VOLTAREN) 1 % GEL Apply 2 g topically 4 (four) times daily as needed. 10/08/22   Tilden Fossa, MD  lactulose (CHRONULAC) 10 GM/15ML solution Take 45 mLs (30 g total) by mouth 2 (two) times daily. 06/09/22   Deeann Saint, MD  Multiple Vitamin (MULTIVITAMIN WITH MINERALS) TABS tablet Take 1 tablet by mouth daily. 04/25/22   Glade Lloyd, MD  rifaximin (XIFAXAN) 550 MG TABS tablet TAKE 1 TABLET BY MOUTH TWICE DAILY. 09/29/22   Armbruster, Willaim Rayas, MD  thiamine (VITAMIN B-1) 100 MG tablet Take 1 tablet (100 mg total) by mouth daily. Patient not taking: Reported on 10/29/2022 04/25/22   Glade Lloyd, MD  tizanidine (ZANAFLEX) 2  MG capsule Take 1 capsule (2 mg total) by mouth 2 (two) times daily. Patient not taking: Reported on 10/29/2022 10/08/22   Tilden Fossa, MD      Allergies    Contrast media [iodinated contrast media], Latex, and Sulfa antibiotics    Review of Systems   Review of Systems  Genitourinary:  Positive for flank pain.  Musculoskeletal:  Positive for back pain.  All other systems reviewed and are negative.   Physical Exam Updated Vital Signs BP (!) 150/95 (BP Location: Left Arm)   Pulse 80   Temp 98.1 F (36.7 C) (Oral)   Resp 18   SpO2 96%  Physical Exam Vitals and nursing note reviewed.  Constitutional:      General: She is not in acute distress.    Appearance: She is well-developed. She is not ill-appearing, toxic-appearing or diaphoretic.     Comments: Resting comfortably in bed  HENT:     Head: Normocephalic and atraumatic.  Eyes:     Conjunctiva/sclera: Conjunctivae normal.  Cardiovascular:     Rate and Rhythm: Normal rate and regular rhythm.     Heart sounds: No murmur heard. Pulmonary:     Effort: Pulmonary effort is normal. No respiratory distress.     Breath sounds: Normal breath sounds. No wheezing or rales.  Abdominal:     General: There is no distension.  Palpations: Abdomen is soft.     Tenderness: There is abdominal tenderness (RUQ). There is no guarding or rebound. Negative signs include Murphy's sign and McBurney's sign.  Musculoskeletal:        General: No swelling.     Cervical back: Neck supple.  Skin:    General: Skin is warm and dry.     Capillary Refill: Capillary refill takes less than 2 seconds.  Neurological:     General: No focal deficit present.     Mental Status: She is alert and oriented to person, place, and time.  Psychiatric:        Mood and Affect: Mood normal.        Behavior: Behavior normal.     ED Results / Procedures / Treatments   Labs (all labs ordered are listed, but only abnormal results are displayed) Labs Reviewed   COMPREHENSIVE METABOLIC PANEL - Abnormal; Notable for the following components:      Result Value   CO2 21 (*)    Albumin 3.4 (*)    AST 53 (*)    Alkaline Phosphatase 146 (*)    Total Bilirubin 1.4 (*)    All other components within normal limits  CBC WITH DIFFERENTIAL/PLATELET - Abnormal; Notable for the following components:   RBC 5.40 (*)    MCH 25.7 (*)    RDW 17.9 (*)    Platelets 115 (*)    All other components within normal limits  URINALYSIS, ROUTINE W REFLEX MICROSCOPIC - Abnormal; Notable for the following components:   Hgb urine dipstick SMALL (*)    All other components within normal limits  LIPASE, BLOOD    EKG None  Radiology CT Chest Wo Contrast  Result Date: 11/06/2022 CLINICAL DATA:  Pneumonia, pleural effusion EXAM: CT CHEST WITHOUT CONTRAST TECHNIQUE: Multidetector CT imaging of the chest was performed following the standard protocol without IV contrast. RADIATION DOSE REDUCTION: This exam was performed according to the departmental dose-optimization program which includes automated exposure control, adjustment of the mA and/or kV according to patient size and/or use of iterative reconstruction technique. COMPARISON:  10/22/2021 FINDINGS: Cardiovascular: No significant abnormalities are seen. Mediastinum/Nodes: There are slightly enlarged lymph nodes in mediastinum. Evaluation of right hilum is limited by the large effusion. Thyroid is enlarged. There are scattered coarse calcifications in thyroid. Lungs/Pleura: There is moderate to large right pleural effusion with interval decrease in size. There is dense atelectasis in right middle lobe and right lower lobe with air bronchogram. Left lung is essentially clear. Visualized right lung shows no focal abnormality. There is no left pleural effusion. There is no pneumothorax. Upper Abdomen: There is nodularity liver surface suggesting cirrhosis. Gallbladder stones are seen. There is interval placement of tips shunt  vascular coils are seen in the epigastrium. Musculoskeletal: No acute findings are seen. IMPRESSION: Moderate to large right pleural effusion. There is interval decrease in the amount of right pleural effusion and clearing of ascites. There is atelectasis in right middle lobe and right lower lobe. Rest of the lung fields shows no infiltrates or discrete nodules. Cirrhosis of liver. Gallbladder stones. Interval placement of tips shunt in the liver and interval placement of vascular coils in epigastrium. Electronically Signed   By: Ernie Avena M.D.   On: 11/06/2022 20:45   CT Renal Stone Study  Result Date: 11/06/2022 CLINICAL DATA:  Abdominal/flank pain, stone suspected EXAM: CT ABDOMEN AND PELVIS WITHOUT CONTRAST TECHNIQUE: Multidetector CT imaging of the abdomen and pelvis was performed following the standard  protocol without IV contrast. RADIATION DOSE REDUCTION: This exam was performed according to the departmental dose-optimization program which includes automated exposure control, adjustment of the mA and/or kV according to patient size and/or use of iterative reconstruction technique. COMPARISON:  10/08/2022 FINDINGS: Lower chest: Large right pleural effusion with compressive atelectasis in the right middle lobe and right lower lobe. Findings are stable since prior study. Heart is normal size. Left lung base clear. Hepatobiliary: Changes of cirrhosis. Prior tips. No focal hepatic abnormality. Gallstones noted within the gallbladder. Gallbladder is contracted. No biliary ductal dilatation. Pancreas: Calcifications in the pancreatic head compatible with chronic pancreatitis, stable. No evidence of acute pancreatitis. No ductal dilatation. Spleen: No focal abnormality.  Normal size. Adrenals/Urinary Tract: No adrenal abnormality. No focal renal abnormality. No stones or hydronephrosis. Urinary bladder is unremarkable. Stomach/Bowel: Normal appendix. Stomach, large and small bowel grossly unremarkable.  Vascular/Lymphatic: No evidence of aneurysm or adenopathy. Reproductive: Uterus and adnexa unremarkable. No mass. IUD in the uterus. Other: Small amount of free fluid in the pelvis.  No free air. Musculoskeletal: Postoperative changes in the lower lumbar spine. No acute bony abnormality. IMPRESSION: Changes of cirrhosis, status post tips. Cholelithiasis. Changes of chronic pancreatitis.  No evidence of acute pancreatitis. Small amount of free fluid in the pelvis. Large right pleural effusion with compressive atelectasis in the right middle lobe and right lower lobe, stable since prior study. No acute findings. Electronically Signed   By: Charlett Nose M.D.   On: 11/06/2022 20:32   US Abdomen Limited RUQ (LIVER/GB)  Result Date: 11/06/2022 CLINICAL DATA:  Right upper quadrant pain for 3 months EXAM: ULTRASOUND ABDOMEN LIMITED RIGHT UPPER QUADRANT COMPARISON:  CT 10/08/2022 FINDINGS: Gallbladder: Multiple stones. Slightly thick wall at 5 mm. Negative sonographic Murphy. Common bile duct: Diameter: Slightly enlarged measuring 8.8 mm Liver: Cirrhotic morphology. No focal hepatic abnormality. Status post tips with visible flow/patency. Other: Right pleural effusion.  Trace ascites. IMPRESSION: 1. Cholelithiasis with slight gallbladder wall thickening but negative sonographic Murphy. Findings are nonspecific and could be seen with liver disease, generalized edematous state, and cholecystitis. If further imaging is required, correlation with nuclear medicine hepatobiliary imaging could be obtained. 2. Slightly enlarged common bile duct. 3. Cirrhotic liver. 4. Right pleural effusion. Trace ascites. Electronically Signed   By: Jasmine Pang M.D.   On: 11/06/2022 16:11    Procedures Procedures    Medications Ordered in ED Medications  oxyCODONE (Oxy IR/ROXICODONE) immediate release tablet 5 mg (5 mg Oral Given 11/06/22 1738)    ED Course/ Medical Decision Making/ A&P                             Medical  Decision Making This patient presents to the ED for concern of right upper quadrant abdominal pain, this involves an extensive number of treatment options, and is a complaint that carries with it a high risk of complications and morbidity.  The emergent DDX for RUQ pain includes but is not limited to Glabladder disease, PUD, Acute Hepatitis, Pancreatitis, pyelonephritis, Pneumonia, Lower lobe PE/Infarct, Kidney stone, GERD, retrocecal appendicitis, Fitz-Hugh-Curtis syndrome, AAA, MI, Zoster.   Co morbidities that complicate the patient evaluation  cirrhosis status post TIPS in 2020, chronic back pain  My initial workup includes abdominal pain labs, pain control, imaging  Additional history obtained from: Nursing notes from this visit. Previous records within EMR system ED visits for similar on 10/29/2022, 10/08/2022, 09/09/2022 Family husband is at bedside and provides  a portion of the history  I ordered, reviewed and interpreted labs which include: CBC, CMP, lipase, urinalysis.  Slight elevation to total bilirubin of 1.4.  Labs otherwise at her baseline.  Urine is without bilirubin.  I ordered imaging studies including right upper quadrant ultrasound, renal stone study, CT chest I independently visualized and interpreted imaging which showed findings as described in the reports.  She does have cholelithiasis with slight gallbladder wall thickening and a slightly dilated common bile duct.  All other findings are stable from previous images. I agree with the radiologist interpretation  Afebrile, hypertensive but otherwise hemodynamically stable.  57 year old female presenting to the ED for evaluation of right upper quadrant abdominal pain with radiation to the right flank.  This appears to be a chronic issue with numerous presentations recently.  She states she has had issues since she had her TIPS procedure in 2028.  She called her surgeon and schedule a follow-up appointment, however this is not for  approximately 2 months.  This is the reason for presentation today.  On exam she appears overall very well.  She is in no acute distress.  She has some mild tenderness to the right upper quadrant with a negative Murphy sign.  Her abdomen is soft and nondistended.  Her lab workup showed a slight elevation to her total bilirubin.  Her labs are otherwise at her baseline.  Urinalysis is without bilirubin.  Imaging without concerning abnormalities.  Her gallbladder wall is slightly thickened and her common bile duct is slightly dilated.  She does have cholelithiasis.  Overall question if her symptoms are secondary to biliary colic versus chronic pain from a cirrhotic liver.  I have low suspicion for acute emergent abnormalities including cholecystitis and cholangitis.  Patient reported significant improvement in his symptoms after pain control in the ED.  She is tolerating p.o. without difficulty.  She was strongly encouraged to follow-up with her general surgeon as soon as possible.  She was also encouraged to schedule an appointment with her GI provider.  She was given strict return precautions.  Stable at discharge.  At this time there does not appear to be any evidence of an acute emergency medical condition and the patient appears stable for discharge with appropriate outpatient follow up. Diagnosis was discussed with patient who verbalizes understanding of care plan and is agreeable to discharge. I have discussed return precautions with patient and husband who verbalizes understanding. Patient encouraged to follow-up with their PCP within 1 week. All questions answered.  Patient's case discussed with Dr. Dalene Seltzer who agrees with plan to discharge with follow-up.   Note: Portions of this report may have been transcribed using voice recognition software. Every effort was made to ensure accuracy; however, inadvertent computerized transcription errors may still be present.        Final Clinical  Impression(s) / ED Diagnoses Final diagnoses:  RUQ abdominal pain    Rx / DC Orders ED Discharge Orders     None         Michelle Piper, Cordelia Poche 11/06/22 2314    Alvira Monday, MD 11/07/22 1356

## 2022-11-11 ENCOUNTER — Telehealth: Payer: Self-pay

## 2022-11-11 NOTE — Telephone Encounter (Signed)
Transition Care Management Unsuccessful Follow-up Telephone Call  Date of discharge and from where:  [facility] 5/16  Attempts:  1st Attempt  Reason for unsuccessful TCM follow-up call:  Left voice message   Lenard Forth [facility] Guide, [facility] [phone] [address] [place], [place], [place] [address] Phone: [phone] Email: [email or web address]

## 2022-11-12 ENCOUNTER — Other Ambulatory Visit (HOSPITAL_COMMUNITY): Payer: Self-pay | Admitting: Interventional Radiology

## 2022-11-12 DIAGNOSIS — K7031 Alcoholic cirrhosis of liver with ascites: Secondary | ICD-10-CM

## 2022-11-12 DIAGNOSIS — R188 Other ascites: Secondary | ICD-10-CM

## 2022-11-13 ENCOUNTER — Ambulatory Visit (INDEPENDENT_AMBULATORY_CARE_PROVIDER_SITE_OTHER): Payer: 59 | Admitting: Family Medicine

## 2022-11-13 ENCOUNTER — Encounter: Payer: Self-pay | Admitting: Family Medicine

## 2022-11-13 VITALS — BP 138/88 | HR 90 | Temp 98.4°F | Wt 141.8 lb

## 2022-11-13 DIAGNOSIS — K7031 Alcoholic cirrhosis of liver with ascites: Secondary | ICD-10-CM

## 2022-11-13 DIAGNOSIS — Z95828 Presence of other vascular implants and grafts: Secondary | ICD-10-CM | POA: Diagnosis not present

## 2022-11-13 DIAGNOSIS — K8020 Calculus of gallbladder without cholecystitis without obstruction: Secondary | ICD-10-CM

## 2022-11-13 DIAGNOSIS — K8050 Calculus of bile duct without cholangitis or cholecystitis without obstruction: Secondary | ICD-10-CM

## 2022-11-13 DIAGNOSIS — R1011 Right upper quadrant pain: Secondary | ICD-10-CM

## 2022-11-13 NOTE — Progress Notes (Signed)
Established Patient Office Visit   Subjective  Patient ID: Katie Holland, female    DOB: [birth year]  Age: 57 y.o. MRN: [id number]  Chief Complaint  Patient presents with   Medical Management of Chronic Issues    RUQ pain, she was informed she was going to pass some stones. Still having pain in right side  Pt accompanied by her husband.  Pt is a 57 year old female with pmh sig for alcoholic cirrhosis with ascites s/p TIPS procedure (11/01/21), bleeding esophageal varices, history of hepatic encephalopathy, tobacco use, malnutrition who was seen for follow-up ongoing concerns.  Patient seen at [facility] on 5/8 and in ED on 5/16 for RUQ pain.  Imaging from 5/16 with cholelithiasis, no evidence of cholelithiasis.  Unclear if patient's abdominal pain 2/2 renal colic versus chronic cirrhosis.  Patient states the pain is unbearable and she wants to have it taken care of.  Patient notes intermittent RUQ pain worse with certain foods.  Causing decreased appetite and nausea.  Patient endorses compliance with rifaximin and lactulose daily.      Past Medical History:  Diagnosis Date   Alcoholic liver disease (HCC)    Chronic back pain    on disability   Cirrhosis of liver (HCC)    Esophageal varices (HCC)    GERD (gastroesophageal reflux disease)    Headache    HEPATIC Hydrothorax    S/P tips   Jaundice    Past Surgical History:  Procedure Laterality Date   BACK SURGERY     L3-L4   BIOPSY  08/07/2020   Procedure: BIOPSY;  Surgeon: Benancio Deeds, MD;  Location: [facility] ENDOSCOPY;  Service: Gastroenterology;;   COLONOSCOPY WITH PROPOFOL N/A 08/01/2019   Procedure: COLONOSCOPY WITH PROPOFOL;  Surgeon: Benancio Deeds, MD;  Location: [facility]OSCOPY;  Service: Gastroenterology;  Laterality: N/A;   ESOPHAGEAL BANDING  04/25/2018   Procedure: ESOPHAGEAL BANDING;  Surgeon: Benancio Deeds, MD;  Location: [facility] ENDOSCOPY;  Service: Gastroenterology;;   ESOPHAGEAL BANDING  04/27/2018   Procedure:  ESOPHAGEAL BANDING;  Surgeon: Rachael Fee, MD;  Location: [facility] ENDOSCOPY;  Service: Endoscopy;;   ESOPHAGEAL BANDING  06/02/2018   Procedure: ESOPHAGEAL BANDING;  Surgeon: Sherrilyn Rist, MD;  Location: [facility] ENDOSCOPY;  Service: Gastroenterology;;   ESOPHAGEAL BANDING N/A 07/07/2018   Procedure: ESOPHAGEAL BANDING;  Surgeon: Benancio Deeds, MD;  Location: [facility];  Service: Gastroenterology;  Laterality: N/A;   ESOPHAGEAL BANDING  07/27/2018   Procedure: ESOPHAGEAL BANDING;  Surgeon: Benancio Deeds, MD;  Location: [facility]OS[facility];  Service: Gastroenterology;;   ESOPHAGEAL BANDING  08/30/2018   Procedure: ESOPHAGEAL BANDING;  Surgeon: Benancio Deeds, MD;  Location: [facility]OS[facility];  Service: Gastroenterology;;   ESOPHAGOGASTRODUODENOSCOPY N/A 04/27/2018   Procedure: ESOPHAGOGASTRODUODENOSCOPY (EGD);  Surgeon: Rachael Fee, MD;  Location[facility] ENDOSCOPY;  Service: Endoscopy;  Laterality: N/A;   ESOPHAGOGASTRODUODENOSCOPY (EGD) WITH PROPOFOL N/A 04/25/2018   Procedure: ESOPHAGOGASTRODUODENOSCOPY (EGD) WITH PROPOFOL;  Surgeon: Benancio Deeds, MD;  Location: [facility] ENDOSCOPY;  Service: Gastroenterology;  Laterality: N/A;   ESOPHAGOGASTRODUODENOSCOPY (EGD) WITH PROPOFOL N/A 06/02/2018   Procedure: ESOPHAGOGASTRODUODENOSCOPY (EGD) WITH PROPOFOL;  Surgeon: Sherrilyn Rist, MD;  Location: [facility] ENDOSCOPY;  Service: Gastroenterology;  Laterality: N/A;   ESOPHAGOGASTRODUODENOSCOPY (EGD) WITH PROPOFOL N/A 07/07/2018   Procedure: ESOPHAGOGASTRODUODENOSCOPY (EGD) WITH PROPOFOL;  Surgeon: Benancio Deeds, MD;  Location: [facility];  Service: Gastroenterology;  Laterality: N/A;   ESOPHAGOGASTRODUODENOSCOPY (EGD) WITH PROPOFOL N/A 07/27/2018   Procedure: ESOPHAGOGASTRODUODENOSCOPY (EGD) WITH PROPOFOL;  Surgeon: Adela Lank,  Willaim Rayas, MD;  Location: [facility] ENDOSCOPY;  Service: Gastroenterology;  Laterality: N/A;   ESOPHAGOGASTRODUODENOSCOPY (EGD) WITH PROPOFOL N/A 08/30/2018   Procedure:  ESOPHAGOGASTRODUODENOSCOPY (EGD) WITH PROPOFOL;  Surgeon: Benancio Deeds, MD;  Location: [facility];  Service: Gastroenterology;  Laterality: N/A;   ESOPHAGOGASTRODUODENOSCOPY (EGD) WITH PROPOFOL N/A 11/08/2018   Procedure: ESOPHAGOGASTRODUODENOSCOPY (EGD) WITH PROPOFOL;  Surgeon: Benancio Deeds, MD;  Location: [facility] ENDOSCOPY;  Service: Gastroenterology;  Laterality: N/A;   ESOPHAGOGASTRODUODENOSCOPY (EGD) WITH PROPOFOL N/A 01/31/2019   Procedure: ESOPHAGOGASTRODUODENOSCOPY (EGD) WITH PROPOFOL;  Surgeon: Benancio Deeds, MD;  Location: [facility]OSCOPY;  Service: Gastroenterology;  Laterality: N/A;   ESOPHAGOGASTRODUODENOSCOPY (EGD) WITH PROPOFOL N/A 08/01/2019   Procedure: ESOPHAGOGASTRODUODENOSCOPY (EGD) WITH PROPOFOL;  Surgeon: Benancio Deeds, MD;  Location: [facility];  Service: Gastroenterology;  Laterality: N/A;   ESOPHAGOGASTRODUODENOSCOPY (EGD) WITH PROPOFOL N/A 08/07/2020   Procedure: ESOPHAGOGASTRODUODENOSCOPY (EGD) WITH PROPOFOL;  Surgeon: Benancio Deeds, MD;  Location: [facility];  Service: Gastroenterology;  Laterality: N/A;   ESOPHAGOGASTRODUODENOSCOPY (EGD) WITH PROPOFOL N/A 08/22/2021   Procedure: ESOPHAGOGASTRODUODENOSCOPY (EGD) WITH PROPOFOL;  Surgeon: Benancio Deeds, MD;  Location: [facility] ENDOSCOPY;  Service: Gastroenterology;  Laterality: N/A;   IR EMBO ART  VEN HEMORR LYMPH EXTRAV  INC GUIDE ROADMAPPING  01/02/2022   IR INTRAVASCULAR ULTRASOUND NON CORONARY  11/05/2021   IR PARACENTESIS  04/26/2018   IR PARACENTESIS  05/18/2018   IR PARACENTESIS  05/31/2018   IR PARACENTESIS  06/30/2018   IR PARACENTESIS  08/05/2018   IR PARACENTESIS  08/31/2018   IR PARACENTESIS  10/08/2018   IR PARACENTESIS  10/28/2018   IR PARACENTESIS  02/14/2019   IR PARACENTESIS  03/09/2019   IR PARACENTESIS  04/05/2019   IR PARACENTESIS  05/03/2019   IR PARACENTESIS  06/20/2019   IR PARACENTESIS  07/19/2019   IR PARACENTESIS  08/19/2019   IR PARACENTESIS  09/27/2019   IR PARACENTESIS   10/07/2019   IR PARACENTESIS  10/28/2019   IR PARACENTESIS  11/16/2019   IR PARACENTESIS  12/30/2019   IR PARACENTESIS  01/16/2020   IR PARACENTESIS  02/07/2020   IR PARACENTESIS  03/06/2020   IR PARACENTESIS  04/04/2020   IR PARACENTESIS  04/17/2020   IR PARACENTESIS  05/04/2020   IR PARACENTESIS  06/07/2020   IR PARACENTESIS  06/28/2020   IR PARACENTESIS  07/27/2020   IR PARACENTESIS  08/20/2020   IR PARACENTESIS  09/13/2020   IR PARACENTESIS  10/04/2020   IR PARACENTESIS  10/25/2020   IR PARACENTESIS  11/07/2020   IR PARACENTESIS  11/30/2020   IR PARACENTESIS  12/13/2020   IR PARACENTESIS  01/04/2021   IR PARACENTESIS  01/25/2021   IR PARACENTESIS  02/20/2021   IR PARACENTESIS  03/20/2021   IR PARACENTESIS  04/18/2021   IR PARACENTESIS  05/14/2021   IR PARACENTESIS  06/10/2021   IR PARACENTESIS  06/27/2021   IR PARACENTESIS  07/17/2021   IR PARACENTESIS  08/12/2021   IR PARACENTESIS  08/23/2021   IR PARACENTESIS  09/13/2021   IR PARACENTESIS  10/02/2021   IR PARACENTESIS  10/22/2021   IR PARACENTESIS  11/01/2021   IR PARACENTESIS  11/29/2021   IR PARACENTESIS  12/18/2021   IR PARACENTESIS  01/02/2022   IR RADIOLOGIST EVAL & MGMT  12/23/2018   IR RADIOLOGIST EVAL & MGMT  10/11/2021   IR RADIOLOGIST EVAL & MGMT  12/27/2021   IR THORACENTESIS ASP PLEURAL SPACE W/IMG GUIDE  12/10/2018   IR THORACENTESIS ASP PLEURAL SPACE W/IMG GUIDE  01/21/2019  IR THORACENTESIS ASP PLEURAL SPACE W/IMG GUIDE  10/23/2021   IR THORACENTESIS ASP PLEURAL SPACE W/IMG GUIDE  10/25/2021   IR THORACENTESIS ASP PLEURAL SPACE W/IMG GUIDE  11/01/2021   IR THORACENTESIS ASP PLEURAL SPACE W/IMG GUIDE  01/03/2022   IR THROMBECT VENO MECH MOD SED  01/02/2022   IR TIPS  11/01/2021   IR TIPS REVISION MOD SED  01/02/2022   IR TRANSCATH RETRIEVAL FB INCL GUIDANCE (MS)  01/02/2022   IR US GUIDE VASC ACCESS RIGHT  11/01/2021   IR US GUIDE VASC ACCESS RIGHT  11/01/2021   IR US GUIDE VASC ACCESS RIGHT  11/01/2021   IR US GUIDE VASC ACCESS RIGHT  01/02/2022    RADIOLOGY WITH ANESTHESIA N/A 11/01/2021   Procedure: TIPS;  Surgeon: Sterling Big, MD;  Location: [facility] OR;  Service: Radiology;  Laterality: N/A;   Social History   Tobacco Use   Smoking status: Former    Packs/day: 0.25    Years: 20.00    Additional pack years: 0.00    Total pack years: 5.00    Types: Cigarettes    Quit date: 07/24/2022    Years since quitting: 0.3   Smokeless tobacco: Never   Tobacco comments:    5 a day  Vaping Use   Vaping Use: Former  Substance Use Topics   Alcohol use: Not Currently   Drug use: Not Currently   Family History  Problem Relation Age of Onset   Colon cancer Neg Hx    Esophageal cancer Neg Hx    Pancreatic cancer Neg Hx    Stomach cancer Neg Hx    Allergies  Allergen Reactions   Contrast Media [Iodinated Contrast Media] Itching and Swelling    Needs 13-hr prep   Latex Itching, Swelling and Other (See Comments)   Sulfa Antibiotics Itching, Swelling and Other (See Comments)    No breathing impairment      ROS Negative unless stated above    Objective:     BP 138/88 (BP Location: Right Arm, Patient Position: Sitting, Cuff Size: Normal)   Pulse 90   Temp 98.4 F (36.9 C) (Oral)   Wt 141 lb 12.8 oz (64.3 kg)   SpO2 91%   BMI 24.34 kg/m  BP Readings from Last 3 Encounters:  11/13/22 138/88  11/06/22 (!) 150/95  10/29/22 (!) 146/90   Wt Readings from Last 3 Encounters:  11/13/22 141 lb 12.8 oz (64.3 kg)  10/29/22 127 lb (57.6 kg)  07/31/22 141 lb 8 oz (64.2 kg)      Physical Exam Constitutional:      General: She is not in acute distress.    Appearance: Normal appearance.  HENT:     Head: Normocephalic and atraumatic.     Nose: Nose normal.     Mouth/Throat:     Mouth: Mucous membranes are moist.  Eyes:     General: No scleral icterus. Cardiovascular:     Rate and Rhythm: Normal rate and regular rhythm.     Heart sounds: Normal heart sounds. No murmur heard.    No gallop.  Pulmonary:     Effort:  Pulmonary effort is normal. No respiratory distress.     Breath sounds: Normal breath sounds. No wheezing, rhonchi or rales.  Abdominal:     General: There is distension.     Palpations: Abdomen is soft. There is no fluid wave.     Tenderness: There is abdominal tenderness in the right upper quadrant.  Skin:  General: Skin is warm and dry.  Neurological:     Mental Status: She is alert and oriented to person, place, and time. Mental status is at baseline.    No results found for any visits on 11/13/22.    Assessment & Plan:  Biliary colic  Calculus of gallbladder without cholecystitis without obstruction  RUQ pain  Alcoholic cirrhosis of liver with ascites (HCC)  S/P TIPS (transjugular intrahepatic portosystemic shunt)  Imaging from ED visit on 11/06/2022 reviewed. -RUQ ultrasound 11/06/2022 with cholelithiasis with slight gallbladder wall thickening but negative sonographic Murphy's.  Findings are nonspecific and could be seen with liver disease, generalized edematous state, and cholecystitis.  If further imaging is required correlation with nuclear medicine hepatobiliary imaging could be obtained.  Slightly enlarged common bile duct.  Cirrhotic liver.  Right pleural effusion.  Trace ascites.  -CT renal stone study 11/06/2022 changes of cirrhosis status post TIPS.  Cholelithiasis.  Changes of chronic pancreatitis.  No acute pancreatitis.  Small amount of free fluid in the pelvis.  Large right pleural effusion with compressive atelectasis in the right middle lobe and right lower lobe, stable since prior study.  No acute findings.  -CT chest without contrast 11/06/2022: Moderate to large right pleural effusion.  Interval decrease in the amount of right pleural effusion and clearing of ascites.  Atelectasis in right middle lobe and right lower lobe.  Rest of lung fields showed no infiltrates or discrete nodules.  Cirrhosis of liver.  Gallbladder stones.  Interval placement of TIPS shunt in  the liver and interval placement of vascular coils in epigastrium.  Labs from ED 11/06/2022 reviewed.  Alk phos 146, AST 53, and T. bili 1.4.  White count normal.  Thrombocytopenia as platelets 115.  Given pt's increasing RUQ pain and recent gallstones noted on imaging discussed follow-up with GI and general surgery.  Discussed various treatment options including cholecystectomy.  No cholecystitis currently noted.  Attempted to contact GI office will pt was in clinic.  Will try again to help set up appointment.  Patient given strict precautions.  Return in about 1 month (around 12/14/2022).   Deeann Saint, MD

## 2022-11-14 ENCOUNTER — Telehealth: Payer: Self-pay

## 2022-11-14 NOTE — Telephone Encounter (Signed)
Transition Care Management Unsuccessful Follow-up Telephone Call  Date of discharge and from where:  Katie Holland 5/16   Attempts:  2nd Attempt  Reason for unsuccessful TCM follow-up call:  Left voice message   Katie Holland [facility] Guide, [facility] [phone] [address] [place], [place], [place] [address] Phone: [phone] Email: [email or web address]

## 2022-11-18 ENCOUNTER — Ambulatory Visit (INDEPENDENT_AMBULATORY_CARE_PROVIDER_SITE_OTHER): Payer: 59 | Admitting: Gastroenterology

## 2022-11-18 ENCOUNTER — Other Ambulatory Visit (INDEPENDENT_AMBULATORY_CARE_PROVIDER_SITE_OTHER): Payer: 59

## 2022-11-18 ENCOUNTER — Encounter: Payer: Self-pay | Admitting: Gastroenterology

## 2022-11-18 VITALS — BP 140/90 | HR 92 | Ht 64.0 in | Wt 144.2 lb

## 2022-11-18 DIAGNOSIS — K7682 Hepatic encephalopathy: Secondary | ICD-10-CM | POA: Diagnosis not present

## 2022-11-18 DIAGNOSIS — J90 Pleural effusion, not elsewhere classified: Secondary | ICD-10-CM

## 2022-11-18 DIAGNOSIS — R109 Unspecified abdominal pain: Secondary | ICD-10-CM | POA: Diagnosis not present

## 2022-11-18 DIAGNOSIS — K7031 Alcoholic cirrhosis of liver with ascites: Secondary | ICD-10-CM | POA: Diagnosis not present

## 2022-11-18 LAB — PROTIME-INR
INR: 1.3 ratio — ABNORMAL HIGH (ref 0.8–1.0)
Prothrombin Time: 14.1 s — ABNORMAL HIGH (ref 9.6–13.1)

## 2022-11-18 NOTE — Patient Instructions (Signed)
If your blood pressure at your visit was 140/90 or greater, please contact your primary care physician to follow up on this. ______________________________________________________  If you are age 58 or older, your body mass index should be between 23-30. Your Body mass index is 24.76 kg/m. If this is out of the aforementioned range listed, please consider follow up with your Primary Care Provider.  If you are age 25 or younger, your body mass index should be between 19-25. Your Body mass index is 24.76 kg/m. If this is out of the aformentioned range listed, please consider follow up with your Primary Care Provider.  ________________________________________________________  The [facility] providers would like to encourage you to use [facility] to communicate with providers for non-urgent requests or questions.  Due to long hold times on the telephone, sending your provider a message by [facility] may be a faster and more efficient way to get a response.  Please allow 48 business hours for a response.  Please remember that this is for non-urgent requests.  _______________________________________________________  Due to recent changes in healthcare laws, you may see the results of your imaging and laboratory studies on MyChart before your provider has had a chance to review them.  We understand that in some cases there may be results that are confusing or concerning to you. Not all laboratory results come back in the same time frame and the provider may be waiting for multiple results in order to interpret others.  Please give [place] 48 hours in order for your provider to thoroughly review all the results before contacting the office for clarification of your results.   You have been scheduled for an Thoracentesis at [facility] Radiology (1st floor of hospital) on Thursday, 11-20-22 at 1:00pm. Please arrive at least 30 minutes prior to your appointment time for registration. Should you need to reschedule this  appointment for any reason, please call our office at [phone].  Please go to the lab in the basement of our building to have lab work done as you leave today. Hit "B" for basement when you get on the elevator.  When the doors open the lab is on your left.  We will call you with the results. Thank you.  You will be due for a liver ultrasound in November.  Do not take any NSAIDs.  Use Tylenol instead.  We are referring you to [facility].  They will contact you directly to schedule an appointment.  It may take a week or more before you hear from them.  Please feel free to contact us if you have not heard from them within 2 weeks and we will follow up on the referral.   Thank you for entrusting me with your care and for choosing [facility], Dr. Ileene Patrick

## 2022-11-18 NOTE — Progress Notes (Signed)
HPI :  57 y/o female here for a follow up visit for decompensated alcoholic cirrhosis, accompanied by her husband today.   Recall she has a history of decompensated cirrhosis secondary to alcohol - course was complicated by significant esophageal varices leading to bleeding in the past.  She has had a few admissions at the end of 2019 and required over 25 bands to eradicate varices.  Her course is also been complicated by significant recurrent ascites and hepatic hydrothorax. Recall she has not been on nonselective beta-blockade in the past due to refractory ascites.  She was unfortunately noncompliant with diuretics, ultimately got admitted with refractory hepatic hydrothorax and respiratory distress.     Eventually underwent TIPS per IR in May 2023 which was complicated by early thrombosis necessitating TIPS revision in July 2023. TIPS revision was successful and the large portosystemic collaterals were embolized at the time of revision.     In general she has been doing really well in regards to the ascites since the TIPS.  She has not required any further paracentesis.  Off all diuretics.  There was question of hepatic encephalopathy, she is on lactulose and rifaximin and compliant with that and states it is working well for her.  The main issue she has had lately is pain in her right flank to her right back.  She was seen in the ED for this on May 16.  She had a right upper quadrant ultrasound, CT scan renal stone study, CT scan of the chest.  She was noted to have gallstones which she is actually had for years, they questioned whether her symptoms could be related to gallstones.  She also was noted to have a large right-sided pleural effusion which has persisted post TIPS.  She does endorse some shortness of breath at baseline which is not improved.  Her pain is in her right flank to right back.  She is very tender at the site.  This appears positional, sitting tends to make this worse.  Tends  to come and go, reproducible with palpation.  Eating does not make this worse.  No nausea or vomiting.  She has been taking some Advil as needed for it.   Recall alcohol abstinence has been very difficult for her in the past.  She is adamant she has not had any alcohol since last summer since she was dealing with hepatic hydrothorax and has been doing a very good job with this.     She had a Doppler ultrasound of her liver in January which showed a patent TIPS.  Her labs from December are stable.  Most recent endoscopies: EGD 07/07/18 - 4 bands placed EGD 07/27/18 - 3 bands placed EGD 08/30/18 - 3 bands placed  EGD 11/08/18 -no varices amenable to banding, portal hypertensive gastritis EGD 01/31/19 - portal hypertensive gastritis, no varices   EGD 08/01/19 - There was extensive scarring in the distal esophagus secondary to prior banding, but no obvious varices noted today. The exam of the esophagus was otherwise normal. Diffuse severely congested mucosa was found in the gastric antrum secondary to portal hypertension.. The exam of the stomach was otherwise normal. No gastric varices. The duodenal bulb and second portion of the duodenum were normal.     Colonoscopy 08/01/19 - Preparation of the colon was fair - most of the colon was well visualized - a small portion of the transverse colon, and then the splenic flexure / proximal descending colon had some residual stool that could not  be cleared but nothing obvious noted in these areas. - Hemorrhoids found on perianal exam. - The examined portion of the ileum was normal. - Diffuse colonic congestion / edema in the entire examined colon from portal hypertension. - Internal hemorrhoids. - The examination was otherwise normal.     EGD 08/07/20: Esophagogastric landmarks identified. - Normal esophagus otherwise - scarring from prior banding noted but appreciable varices. - Antral gastritis with an erosion. - Normal stomach otherwise - biopsies taken  to rule out H pylori - A single duodenal polyp. Resected and retrieved. - Normal duodenum otherwise      EGD 08/22/21: Esophagogastric landmarks identified. - Trace esophageal varices in the lower esophagus without any high risk stigmata. - Multiple areas of scarring from prior banding - A large amount of food (residue) in the stomach, exam of the stomach and duodenum not performed once this was seen and procedure stopped.     [place] RUQ with doppler 06/25/22: IMPRESSION: Directed duplex of the hepatic vasculature demonstrates patent tips and portal venous system. Right-sided pleural effusion, with scant ascites.     CT chest 11/06/22: IMPRESSION: Moderate to large right pleural effusion. There is interval decrease in the amount of right pleural effusion and clearing of ascites. There is atelectasis in right middle lobe and right lower lobe. Rest of the lung fields shows no infiltrates or discrete nodules.   Cirrhosis of liver. Gallbladder stones. Interval placement of tips shunt in the liver and interval placement of vascular coils in epigastrium.   CT renal stone study 11/06/22: IMPRESSION: Changes of cirrhosis, status post tips. Cholelithiasis. Changes of chronic pancreatitis.  No evidence of acute pancreatitis. Small amount of free fluid in the pelvis. Large right pleural effusion with compressive atelectasis in the right middle lobe and right lower lobe, stable since prior study.   No acute findings.   RUQ [place] 11/06/22: MPRESSION: 1. Cholelithiasis with slight gallbladder wall thickening but negative sonographic Murphy. Findings are nonspecific and could be seen with liver disease, generalized edematous state, and cholecystitis. If further imaging is required, correlation with nuclear medicine hepatobiliary imaging could be obtained. 2. Slightly enlarged common bile duct. 3. Cirrhotic liver. 4. Right pleural effusion. Trace ascites.   CT renal stone study  10/08/22: IMPRESSION: 1. No acute findings are noted in the abdomen or pelvis to account for the patient's symptoms. 2. Cholelithiasis without definite imaging findings to suggest an acute cholecystitis at this time. 3. Advanced hepatic cirrhosis status post TIPS. 4. Trace volume of ascites. 5. Large chronic right pleural effusion with chronic passive atelectasis in the right middle and lower lobes, similar to prior study from 2020. 6. Additional incidental findings, as above.    Past Medical History:  Diagnosis Date   Alcoholic liver disease (HCC)    Chronic back pain    on disability   Cirrhosis of liver (HCC)    Esophageal varices (HCC)    GERD (gastroesophageal reflux disease)    Headache    HEPATIC Hydrothorax    S/P tips   Jaundice      Past Surgical History:  Procedure Laterality Date   BACK SURGERY     L3-L4   BIOPSY  08/07/2020   Procedure: BIOPSY;  Surgeon: Benancio Deeds, MD;  Location: [facility] ENDOSCOPY;  Service: Gastroenterology;;   COLONOSCOPY WITH PROPOFOL N/A 08/01/2019   Procedure: COLONOSCOPY WITH PROPOFOL;  Surgeon: Benancio Deeds, MD;  Location: [facility] END[facility];  Service: Gastroenterology;  Laterality: N/A;   ESOPHAGEAL BANDING  04/25/2018  Procedure: ESOPHAGEAL BANDING;  Surgeon: Benancio Deeds, MD;  Location: [facility] ENDOSCOPY;  Service: Gastroenterology;;   ESOPHAGEAL BANDING  04/27/2018   Procedure: ESOPHAGEAL BANDING;  Surgeon: Rachael Fee, MD;  Location: [facility] ENDOSCOPY;  Service: Endoscopy;;   ESOPHAGEAL BANDING  06/02/2018   Procedure: ESOPHAGEAL BANDING;  Surgeon: Sherrilyn Rist, MD;  Location: [facility] ENDOSCOPY;  Service: Gastroenterology;;   ESOPHAGEAL BANDING N/A 07/07/2018   Procedure: ESOPHAGEAL BANDING;  Surgeon: Benancio Deeds, MD;  Location: [facility] ENDOSCOPY;  Service: Gastroenterology;  Laterality: N/A;   ESOPHAGEAL BANDING  07/27/2018   Procedure: ESOPHAGEAL BANDING;  Surgeon: Benancio Deeds, MD;  Location: [facility] ENDOSCOPY;   Service: Gastroenterology;;   ESOPHAGEAL BANDING  08/30/2018   Procedure: ESOPHAGEAL BANDING;  Surgeon: Benancio Deeds, MD;  Location: [facility] ENDOSCOPY;  Service: Gastroenterology;;   ESOPHAGOGASTRODUODENOSCOPY N/A 04/27/2018   Procedure: ESOPHAGOGASTRODUODENOSCOPY (EGD);  Surgeon: Rachael Fee, MD;  Location: [facility] ENDOSCOPY;  Service: Endoscopy;  Laterality: N/A;   ESOPHAGOGASTRODUODENOSCOPY (EGD) WITH PROPOFOL N/A 04/25/2018   Procedure: ESOPHAGOGASTRODUODENOSCOPY (EGD) WITH PROPOFOL;  Surgeon: Benancio Deeds, MD;  Location: [facility] ENDOSCOPY;  Service: Gastroenterology;  Laterality: N/A;   ESOPHAGOGASTRODUODENOSCOPY (EGD) WITH PROPOFOL N/A 06/02/2018   Procedure: ESOPHAGOGASTRODUODENOSCOPY (EGD) WITH PROPOFOL;  Surgeon: Sherrilyn Rist, MD;  Location: [facility] ENDOSCOPY;  Service: Gastroenterology;  Laterality: N/A;   ESOPHAGOGASTRODUODENOSCOPY (EGD) WITH PROPOFOL N/A 07/07/2018   Procedure: ESOPHAGOGASTRODUODENOSCOPY (EGD) WITH PROPOFOL;  Surgeon: Benancio Deeds, MD;  Location: [facility] ENDOSCOPY;  Service: Gastroenterology;  Laterality: N/A;   ESOPHAGOGASTRODUODENOSCOPY (EGD) WITH PROPOFOL N/A 07/27/2018   Procedure: ESOPHAGOGASTRODUODENOSCOPY (EGD) WITH PROPOFOL;  Surgeon: Benancio Deeds, MD;  Location: [facility] ENDOSCOPY;  Service: Gastroenterology;  Laterality: N/A;   ESOPHAGOGASTRODUODENOSCOPY (EGD) WITH PROPOFOL N/A 08/30/2018   Procedure: ESOPHAGOGASTRODUODENOSCOPY (EGD) WITH PROPOFOL;  Surgeon: Benancio Deeds, MD;  Location: [facility] ENDOSCOPY;  Service: Gastroenterology;  Laterality: N/A;   ESOPHAGOGASTRODUODENOSCOPY (EGD) WITH PROPOFOL N/A 11/08/2018   Procedure: ESOPHAGOGASTRODUODENOSCOPY (EGD) WITH PROPOFOL;  Surgeon: Benancio Deeds, MD;  Location: [facility] ENDOSCOPY;  Service: Gastroenterology;  Laterality: N/A;   ESOPHAGOGASTRODUODENOSCOPY (EGD) WITH PROPOFOL N/A 01/31/2019   Procedure: ESOPHAGOGASTRODUODENOSCOPY (EGD) WITH PROPOFOL;  Surgeon: Benancio Deeds, MD;  Location: [facility]  ENDOSCOPY;  Service: Gastroenterology;  Laterality: N/A;   ESOPHAGOGASTRODUODENOSCOPY (EGD) WITH PROPOFOL N/A 08/01/2019   Procedure: ESOPHAGOGASTRODUODENOSCOPY (EGD) WITH PROPOFOL;  Surgeon: Benancio Deeds, MD;  Location: [facility] ENDOSCOPY;  Service: Gastroenterology;  Laterality: N/A;   ESOPHAGOGASTRODUODENOSCOPY (EGD) WITH PROPOFOL N/A 08/07/2020   Procedure: ESOPHAGOGASTRODUODENOSCOPY (EGD) WITH PROPOFOL;  Surgeon: Benancio Deeds, MD;  Location: [facility] ENDOSCOPY;  Service: Gastroenterology;  Laterality: N/A;   ESOPHAGOGASTRODUODENOSCOPY (EGD) WITH PROPOFOL N/A 08/22/2021   Procedure: ESOPHAGOGASTRODUODENOSCOPY (EGD) WITH PROPOFOL;  Surgeon: Benancio Deeds, MD;  Location: [facility] ENDOSCOPY;  Service: Gastroenterology;  Laterality: N/A;   IR EMBO ART  VEN HEMORR LYMPH EXTRAV  INC GUIDE ROADMAPPING  01/02/2022   IR INTRAVASCULAR ULTRASOUND NON CORONARY  11/05/2021   IR PARACENTESIS  04/26/2018   IR PARACENTESIS  05/18/2018   IR PARACENTESIS  05/31/2018   IR PARACENTESIS  06/30/2018   IR PARACENTESIS  08/05/2018   IR PARACENTESIS  08/31/2018   IR PARACENTESIS  10/08/2018   IR PARACENTESIS  10/28/2018   IR PARACENTESIS  02/14/2019   IR PARACENTESIS  03/09/2019   IR PARACENTESIS  04/05/2019   IR PARACENTESIS  05/03/2019   IR PARACENTESIS  06/20/2019   IR PARACENTESIS  07/19/2019   IR PARACENTESIS  08/19/2019   IR PARACENTESIS  09/27/2019  IR PARACENTESIS  10/07/2019   IR PARACENTESIS  10/28/2019   IR PARACENTESIS  11/16/2019   IR PARACENTESIS  12/30/2019   IR PARACENTESIS  01/16/2020   IR PARACENTESIS  02/07/2020   IR PARACENTESIS  03/06/2020   IR PARACENTESIS  04/04/2020   IR PARACENTESIS  04/17/2020   IR PARACENTESIS  05/04/2020   IR PARACENTESIS  06/07/2020   IR PARACENTESIS  06/28/2020   IR PARACENTESIS  07/27/2020   IR PARACENTESIS  08/20/2020   IR PARACENTESIS  09/13/2020   IR PARACENTESIS  10/04/2020   IR PARACENTESIS  10/25/2020   IR PARACENTESIS  11/07/2020   IR PARACENTESIS  11/30/2020   IR  PARACENTESIS  12/13/2020   IR PARACENTESIS  01/04/2021   IR PARACENTESIS  01/25/2021   IR PARACENTESIS  02/20/2021   IR PARACENTESIS  03/20/2021   IR PARACENTESIS  04/18/2021   IR PARACENTESIS  05/14/2021   IR PARACENTESIS  06/10/2021   IR PARACENTESIS  06/27/2021   IR PARACENTESIS  07/17/2021   IR PARACENTESIS  08/12/2021   IR PARACENTESIS  08/23/2021   IR PARACENTESIS  09/13/2021   IR PARACENTESIS  10/02/2021   IR PARACENTESIS  10/22/2021   IR PARACENTESIS  11/01/2021   IR PARACENTESIS  11/29/2021   IR PARACENTESIS  12/18/2021   IR PARACENTESIS  01/02/2022   IR RADIOLOGIST EVAL & MGMT  12/23/2018   IR RADIOLOGIST EVAL & MGMT  10/11/2021   IR RADIOLOGIST EVAL & MGMT  12/27/2021   IR THORACENTESIS ASP PLEURAL SPACE W/IMG GUIDE  12/10/2018   IR THORACENTESIS ASP PLEURAL SPACE W/IMG GUIDE  01/21/2019   IR THORACENTESIS ASP PLEURAL SPACE W/IMG GUIDE  10/23/2021   IR THORACENTESIS ASP PLEURAL SPACE W/IMG GUIDE  10/25/2021   IR THORACENTESIS ASP PLEURAL SPACE W/IMG GUIDE  11/01/2021   IR THORACENTESIS ASP PLEURAL SPACE W/IMG GUIDE  01/03/2022   IR THROMBECT VENO MECH MOD SED  01/02/2022   IR TIPS  11/01/2021   IR TIPS REVISION MOD SED  01/02/2022   IR TRANSCATH RETRIEVAL FB INCL GUIDANCE (MS)  01/02/2022   IR US GUIDE VASC ACCESS RIGHT  11/01/2021   IR US GUIDE VASC ACCESS RIGHT  11/01/2021   IR US GUIDE VASC ACCESS RIGHT  11/01/2021   IR US GUIDE VASC ACCESS RIGHT  01/02/2022   RADIOLOGY WITH ANESTHESIA N/A 11/01/2021   Procedure: TIPS;  Surgeon: Sterling Big, MD;  Location: [facility] OR;  Service: Radiology;  Laterality: N/A;   Family History  Problem Relation Age of Onset   Colon cancer Neg Hx    Esophageal cancer Neg Hx    Pancreatic cancer Neg Hx    Stomach cancer Neg Hx    Social History   Tobacco Use   Smoking status: Former    Packs/day: 0.25    Years: 20.00    Additional pack years: 0.00    Total pack years: 5.00    Types: Cigarettes    Quit date: 07/24/2022    Years since quitting: 0.3   Smokeless  tobacco: Never   Tobacco comments:    5 a day  Vaping Use   Vaping Use: Former  Substance Use Topics   Alcohol use: Not Currently   Drug use: Not Currently   Current Outpatient Medications  Medication Sig Dispense Refill   acetaminophen (TYLENOL) 500 MG tablet Take 500-1,000 mg by mouth daily as needed for mild pain, headache or fever.     diclofenac Sodium (VOLTAREN) 1 % GEL Apply 2 g  topically 4 (four) times daily as needed. 100 g 0   lactulose (CHRONULAC) 10 GM/15ML solution Take 45 mLs (30 g total) by mouth 2 (two) times daily. 1892 mL 12   Multiple Vitamin (MULTIVITAMIN WITH MINERALS) TABS tablet Take 1 tablet by mouth daily.     rifaximin (XIFAXAN) 550 MG TABS tablet TAKE 1 TABLET BY MOUTH TWICE DAILY. 60 tablet 3   No current facility-administered medications for this visit.   Facility-Administered Medications Ordered in Other Visits  Medication Dose Route Frequency Provider Last Rate Last Admin   lactated ringers infusion    Continuous PRN Jodell Cipro, CRNA   New Bag at 03/09/19 0800   lactated ringers infusion    Continuous PRN Jodell Cipro, CRNA   New Bag at 03/09/19 1610   Allergies  Allergen Reactions   Contrast Media [Iodinated Contrast Media] Itching and Swelling    Needs 13-hr prep   Latex Itching, Swelling and Other (See Comments)   Sulfa Antibiotics Itching, Swelling and Other (See Comments)    No breathing impairment     Review of Systems: All systems reviewed and negative except where noted in HPI.    CT Chest Wo Contrast  Result Date: 11/06/2022 CLINICAL DATA:  Pneumonia, pleural effusion EXAM: CT CHEST WITHOUT CONTRAST TECHNIQUE: Multidetector CT imaging of the chest was performed following the standard protocol without IV contrast. RADIATION DOSE REDUCTION: This exam was performed according to the departmental dose-optimization program which includes automated exposure control, adjustment of the mA and/or kV according to patient size and/or use of  iterative reconstruction technique. COMPARISON:  10/22/2021 FINDINGS: Cardiovascular: No significant abnormalities are seen. Mediastinum/Nodes: There are slightly enlarged lymph nodes in mediastinum. Evaluation of right hilum is limited by the large effusion. Thyroid is enlarged. There are scattered coarse calcifications in thyroid. Lungs/Pleura: There is moderate to large right pleural effusion with interval decrease in size. There is dense atelectasis in right middle lobe and right lower lobe with air bronchogram. Left lung is essentially clear. Visualized right lung shows no focal abnormality. There is no left pleural effusion. There is no pneumothorax. Upper Abdomen: There is nodularity liver surface suggesting cirrhosis. Gallbladder stones are seen. There is interval placement of tips shunt vascular coils are seen in the epigastrium. Musculoskeletal: No acute findings are seen. IMPRESSION: Moderate to large right pleural effusion. There is interval decrease in the amount of right pleural effusion and clearing of ascites. There is atelectasis in right middle lobe and right lower lobe. Rest of the lung fields shows no infiltrates or discrete nodules. Cirrhosis of liver. Gallbladder stones. Interval placement of tips shunt in the liver and interval placement of vascular coils in epigastrium. Electronically Signed   By: Ernie Avena M.D.   On: 11/06/2022 20:45   CT Renal Stone Study  Result Date: 11/06/2022 CLINICAL DATA:  Abdominal/flank pain, stone suspected EXAM: CT ABDOMEN AND PELVIS WITHOUT CONTRAST TECHNIQUE: Multidetector CT imaging of the abdomen and pelvis was performed following the standard protocol without IV contrast. RADIATION DOSE REDUCTION: This exam was performed according to the departmental dose-optimization program which includes automated exposure control, adjustment of the mA and/or kV according to patient size and/or use of iterative reconstruction technique. COMPARISON:   10/08/2022 FINDINGS: Lower chest: Large right pleural effusion with compressive atelectasis in the right middle lobe and right lower lobe. Findings are stable since prior study. Heart is normal size. Left lung base clear. Hepatobiliary: Changes of cirrhosis. Prior tips. No focal hepatic abnormality. Gallstones noted  within the gallbladder. Gallbladder is contracted. No biliary ductal dilatation. Pancreas: Calcifications in the pancreatic head compatible with chronic pancreatitis, stable. No evidence of acute pancreatitis. No ductal dilatation. Spleen: No focal abnormality.  Normal size. Adrenals/Urinary Tract: No adrenal abnormality. No focal renal abnormality. No stones or hydronephrosis. Urinary bladder is unremarkable. Stomach/Bowel: Normal appendix. Stomach, large and small bowel grossly unremarkable. Vascular/Lymphatic: No evidence of aneurysm or adenopathy. Reproductive: Uterus and adnexa unremarkable. No mass. IUD in the uterus. Other: Small amount of free fluid in the pelvis.  No free air. Musculoskeletal: Postoperative changes in the lower lumbar spine. No acute bony abnormality. IMPRESSION: Changes of cirrhosis, status post tips. Cholelithiasis. Changes of chronic pancreatitis.  No evidence of acute pancreatitis. Small amount of free fluid in the pelvis. Large right pleural effusion with compressive atelectasis in the right middle lobe and right lower lobe, stable since prior study. No acute findings. Electronically Signed   By: Charlett Nose M.D.   On: 11/06/2022 20:32   US Abdomen Limited RUQ (LIVER/GB)  Result Date: 11/06/2022 CLINICAL DATA:  Right upper quadrant pain for 3 months EXAM: ULTRASOUND ABDOMEN LIMITED RIGHT UPPER QUADRANT COMPARISON:  CT 10/08/2022 FINDINGS: Gallbladder: Multiple stones. Slightly thick wall at 5 mm. Negative sonographic Murphy. Common bile duct: Diameter: Slightly enlarged measuring 8.8 mm Liver: Cirrhotic morphology. No focal hepatic abnormality. Status post tips with  visible flow/patency. Other: Right pleural effusion.  Trace ascites. IMPRESSION: 1. Cholelithiasis with slight gallbladder wall thickening but negative sonographic Murphy. Findings are nonspecific and could be seen with liver disease, generalized edematous state, and cholecystitis. If further imaging is required, correlation with nuclear medicine hepatobiliary imaging could be obtained. 2. Slightly enlarged common bile duct. 3. Cirrhotic liver. 4. Right pleural effusion. Trace ascites. Electronically Signed   By: Jasmine Pang M.D.   On: 11/06/2022 16:11    Physical Exam: BP (!) 140/90 (BP Location: Left Arm, Patient Position: Sitting, Cuff Size: Normal)   Pulse 92   Ht 5\' 4"  (1.626 m)   Wt 144 lb 4 oz (65.4 kg)   BMI 24.76 kg/m  Constitutional: Pleasant,well-developed, female in no acute distress. Pulmonary/chest: Effort normal. Decreased BS at R base.   Abdominal: Soft, nondistended, nontender.  R flank / back with focal TTP along R flank / back, along costal margin Extremities: no edema Neurological: Alert and oriented to person place and time. Skin: Skin is warm and dry. No rashes noted. Psychiatric: Normal mood and affect. Behavior is normal.   ASSESSMENT: 57 y.o. female here for assessment of the following  1. Alcoholic cirrhosis of liver with ascites (HCC)   2. Hepatic encephalopathy (HCC)   3. Pleural effusion   4. Flank pain    Now abstinent from alcohol.  Post TIPS with revision which is patent.  Her ascites has resolved.  Generally she has been doing much better from cirrhosis perspective over time since TIPS and being off alcohol.  Recall one of the major issues that led to TIPS was this recurrent hepatic hydrothorax which led her to admission.  Unfortunately that has not resolved and continues to persist.  She does have some baseline dyspnea from it.  She also has been having some flank pain, ER told her it was due to gallstones but this is very positional, not prandial, and  focally tenderness into her right flank.  I think she has musculoskeletal pain there and I do not think gallstones are causing her symptoms.  We discussed plan.  Generally pretty happy with how  she has been doing in regards to her liver functioning.  She is due for INR and AFP today.  Due for repeat ultrasound in November for [facility] screening.  For her pleural effusion will refer her back to IR for thoracentesis to send off for fluid analysis and see if this is still consistent with hepatic hydrothorax or not.  We may need to have her see pulmonary if this persists.  It would be unusual to persist despite TIPS if due to hepatic hydrothorax.  In regards to her pain, I do not think this is biliary colic.  This seems musculoskeletal.  Recommend against all NSAIDs.  She can use Tylenol for pain.  She can use topical muscle rubs.  Will refer her to sports medicine for further evaluation.  She agrees  PLAN: - labs today - INR and AFP - recall RUQ [place] 04/2023 - refer to IR for thoracentesis - cell count, cytology, albumin, protetin, LDH, glucose - no NSAIDs - use tylenol for pain - topical muscle rub okay applied to her back / flank - refer to sports medicine for back / flank pain - f/u 6 months or sooner with issues.  Harlin Rain, MD [facility]astroenterology

## 2022-11-20 ENCOUNTER — Ambulatory Visit (HOSPITAL_COMMUNITY)
Admission: RE | Admit: 2022-11-20 | Discharge: 2022-11-20 | Disposition: A | Discharge status: Home / Self Care | Payer: 59 | Source: Ambulatory Visit | Attending: Student | Admitting: Student

## 2022-11-20 ENCOUNTER — Ambulatory Visit (HOSPITAL_COMMUNITY)
Admission: RE | Admit: 2022-11-20 | Discharge: 2022-11-20 | Disposition: A | Discharge status: Home / Self Care | Payer: 59 | Source: Ambulatory Visit | Attending: Radiology | Admitting: Radiology

## 2022-11-20 ENCOUNTER — Other Ambulatory Visit (HOSPITAL_COMMUNITY): Payer: Self-pay | Admitting: Radiology

## 2022-11-20 DIAGNOSIS — K7682 Hepatic encephalopathy: Secondary | ICD-10-CM

## 2022-11-20 DIAGNOSIS — R109 Unspecified abdominal pain: Secondary | ICD-10-CM

## 2022-11-20 DIAGNOSIS — J90 Pleural effusion, not elsewhere classified: Secondary | ICD-10-CM

## 2022-11-20 DIAGNOSIS — K7031 Alcoholic cirrhosis of liver with ascites: Secondary | ICD-10-CM

## 2022-11-20 DIAGNOSIS — R918 Other nonspecific abnormal finding of lung field: Secondary | ICD-10-CM | POA: Diagnosis not present

## 2022-11-20 HISTORY — PX: IR THORACENTESIS ASP PLEURAL SPACE W/IMG GUIDE: IMG5380

## 2022-11-20 LAB — BODY FLUID CELL COUNT WITH DIFFERENTIAL
Eos, Fluid: 0 %
Lymphs, Fluid: 89 %
Monocyte-Macrophage-Serous Fluid: 10 % — ABNORMAL LOW (ref 50–90)
Neutrophil Count, Fluid: 1 % (ref 0–25)
Total Nucleated Cell Count, Fluid: 219 cu mm (ref 0–1000)

## 2022-11-20 LAB — PROTEIN, PLEURAL OR PERITONEAL FLUID: Total protein, fluid: 3.6 g/dL

## 2022-11-20 LAB — GLUCOSE, PLEURAL OR PERITONEAL FLUID: Glucose, Fluid: 103 mg/dL

## 2022-11-20 LAB — LACTATE DEHYDROGENASE, PLEURAL OR PERITONEAL FLUID: LD, Fluid: 82 U/L — ABNORMAL HIGH (ref 3–23)

## 2022-11-20 LAB — AFP TUMOR MARKER: AFP-Tumor Marker: 2.3 ng/mL

## 2022-11-20 LAB — ALBUMIN, PLEURAL OR PERITONEAL FLUID: Albumin, Fluid: 2 g/dL

## 2022-11-20 MED ORDER — LIDOCAINE HCL 1 % IJ SOLN
INTRAMUSCULAR | Status: AC
  Filled 2022-11-20: qty 20

## 2022-11-21 ENCOUNTER — Other Ambulatory Visit: Payer: Self-pay | Admitting: Interventional Radiology

## 2022-11-21 DIAGNOSIS — R188 Other ascites: Secondary | ICD-10-CM

## 2022-11-21 DIAGNOSIS — K7031 Alcoholic cirrhosis of liver with ascites: Secondary | ICD-10-CM

## 2022-11-21 LAB — PATHOLOGIST SMEAR REVIEW: Path Review: REACTIVE

## 2022-11-28 ENCOUNTER — Other Ambulatory Visit: Payer: Self-pay | Admitting: *Deleted

## 2022-11-28 DIAGNOSIS — J90 Pleural effusion, not elsewhere classified: Secondary | ICD-10-CM

## 2022-12-04 ENCOUNTER — Inpatient Hospital Stay: Admission: RE | Admit: 2022-12-04 | Payer: 59 | Source: Ambulatory Visit

## 2022-12-15 ENCOUNTER — Telehealth: Payer: Self-pay

## 2022-12-15 NOTE — Telephone Encounter (Signed)
-----   Message from Missy Sabins, RN sent at 12/01/2022 10:25 AM EDT ----- Regarding: x-ray PA lateral chest x-ray - need to enter order

## 2022-12-15 NOTE — Telephone Encounter (Signed)
Lm on vm for patient to return call 

## 2022-12-17 NOTE — Telephone Encounter (Signed)
Left message for patient to call back  

## 2022-12-19 NOTE — Telephone Encounter (Signed)
3rd attempt to reach patient - attempted to reach patient on listed phone number twice - line goes to a fast busy signal. Letter mailed to patient.

## 2022-12-22 ENCOUNTER — Ambulatory Visit: Payer: 59 | Admitting: Gastroenterology

## 2022-12-26 ENCOUNTER — Encounter: Payer: Self-pay | Admitting: Gastroenterology

## 2022-12-30 ENCOUNTER — Ambulatory Visit (INDEPENDENT_AMBULATORY_CARE_PROVIDER_SITE_OTHER): Payer: 59

## 2022-12-30 ENCOUNTER — Ambulatory Visit (HOSPITAL_COMMUNITY)
Admission: EM | Admit: 2022-12-30 | Discharge: 2022-12-30 | Disposition: A | Discharge status: Home / Self Care | Payer: 59 | Attending: Emergency Medicine | Admitting: Emergency Medicine

## 2022-12-30 ENCOUNTER — Encounter (HOSPITAL_COMMUNITY): Payer: Self-pay | Admitting: *Deleted

## 2022-12-30 DIAGNOSIS — M545 Low back pain, unspecified: Secondary | ICD-10-CM | POA: Diagnosis not present

## 2022-12-30 DIAGNOSIS — Z981 Arthrodesis status: Secondary | ICD-10-CM | POA: Diagnosis not present

## 2022-12-30 DIAGNOSIS — M5441 Lumbago with sciatica, right side: Secondary | ICD-10-CM

## 2022-12-30 DIAGNOSIS — M79604 Pain in right leg: Secondary | ICD-10-CM | POA: Diagnosis not present

## 2022-12-30 DIAGNOSIS — M47816 Spondylosis without myelopathy or radiculopathy, lumbar region: Secondary | ICD-10-CM | POA: Diagnosis not present

## 2022-12-30 MED ORDER — DICLOFENAC SODIUM 1 % EX GEL: 4.0000 g | Freq: Four times a day (QID) | CUTANEOUS | 2 refills | Status: DC

## 2022-12-30 MED ORDER — METHYLPREDNISOLONE 4 MG PO TBPK: ORAL_TABLET | ORAL | 0 refills | Status: DC

## 2022-12-30 MED ORDER — BACLOFEN 10 MG PO TABS: 10.0000 mg | ORAL_TABLET | Freq: Three times a day (TID) | ORAL | 0 refills | Status: AC

## 2022-12-30 NOTE — ED Provider Notes (Signed)
[facility]    CSN: [id number] Arrival date & time: 12/30/22  1439    HISTORY   Chief Complaint  Patient presents with  . Flank Pain   HPI Katie Holland is a pleasant, 57 y.o. female who presents to urgent care today.  Flank Pain Past Medical History:  Diagnosis Date  . Alcoholic liver disease (HCC)   . Chronic back pain    on disability  . Cirrhosis of liver (HCC)   . Esophageal varices (HCC)   . GERD (gastroesophageal reflux disease)   . Headache   . HEPATIC Hydrothorax    S/P tips  . Jaundice    Patient Active Problem List   Diagnosis Date Noted  . Hepatic encephalopathy (HCC) 04/23/2022  . Pneumothorax on right 11/04/2021  . Elevated lactic acid level 10/23/2021  . Sepsis (HCC) 10/22/2021  . Acute respiratory failure with hypoxia (HCC) 10/22/2021  . CAP (community acquired pneumonia) 10/22/2021  . Increased ammonia level 10/22/2021  . Elevated troponin 10/22/2021  . Abnormal TSH 10/22/2021  . Adenomatous duodenal polyp   . Gastritis and gastroduodenitis   . Colon cancer screening   . Encephalopathy, hepatic (HCC) 05/06/2019  . Ascites 01/20/2019  . Pleural effusion 01/20/2019  . UTI (urinary tract infection) 01/20/2019  . Acute upper GI bleeding 06/02/2018  . Alcohol dependence syndrome (HCC) 06/02/2018  . Decompensated hepatic cirrhosis (HCC)   . Esophageal varices (HCC)   . Alcoholic liver disease (HCC)   . Protein-calorie malnutrition, severe 04/26/2018  . Bleeding esophageal varices (HCC)   . Abdominal pain 04/24/2018  . Hematemesis 04/24/2018  . Hematochezia 04/24/2018  . Alcohol abuse 04/24/2018  . Hyponatremia 04/24/2018  . Hypokalemia 04/24/2018  . Metabolic acidosis, normal anion gap (NAG) 04/24/2018  . History of GI bleed 10/26/2017  . Alcoholic cirrhosis of liver with ascites (HCC) 10/26/2017  . Tobacco dependence 10/26/2017   Past Surgical History:  Procedure Laterality Date  . BACK SURGERY     L3-L4  . BIOPSY  08/07/2020    Procedure: BIOPSY;  Surgeon: Benancio Deeds, MD;  Location: [facility];  Service: Gastroenterology;;  . COLONOSCOPY WITH PROPOFOL N/A 08/01/2019   Procedure: COLONOSCOPY WITH PROPOFOL;  Surgeon: Benancio Deeds, MD;  Location: [facility];  Service: Gastroenterology;  Laterality: N/A;  . ESOPHAGEAL BANDING  04/25/2018   Procedure: ESOPHAGEAL BANDING;  Surgeon: Benancio Deeds, MD;  Location: [facility] ENDOSCOPY;  Service: Gastroenterology;;  . ESOPHAGEAL BANDING  04/27/2018   Procedure: ESOPHAGEAL BANDING;  Surgeon: Rachael Fee, MD;  Location: [facility] ENDOSCOPY;  Service: Endoscopy;;  . ESOPHAGEAL BANDING  06/02/2018   Procedure: ESOPHAGEAL BANDING;  Surgeon: Sherrilyn Rist, MD;  Location: [facility] ENDOSCOPY;  Service: Gastroenterology;;  . ESOPHAGEAL BANDING N/A 07/07/2018   Procedure: ESOPHAGEAL BANDING;  Surgeon: Benancio Deeds, MD;  Location: [facility];  Service: Gastroenterology;  Laterality: N/A;  . ESOPHAGEAL BANDING  07/27/2018   Procedure: ESOPHAGEAL BANDING;  Surgeon: Benancio Deeds, MD;  Location: [facility];  Service: Gastroenterology;;  . ESOPHAGEAL BANDING  08/30/2018   Procedure: ESOPHAGEAL BANDING;  Surgeon: Benancio Deeds, MD;  Location: [facility];  Service: Gastroenterology;;  . ESOPHAGOGASTRODUODENOSCOPY N/A 04/27/2018   Procedure: ESOPHAGOGASTRODUODENOSCOPY (EGD);  Surgeon: Rachael Fee, MD;  Location: [facility] ENDOSCOPY;  Service: Endoscopy;  Laterality: N/A;  . ESOPHAGOGASTRODUODENOSCOPY (EGD) WITH PROPOFOL N/A 04/25/2018   Procedure: ESOPHAGOGASTRODUODENOSCOPY (EGD) WITH PROPOFOL;  Surgeon: Benancio Deeds, MD;  Location: [facility] ENDOSCOPY;  Service: Gastroenterology;  Laterality: N/A;  . ESOPHAGOGASTRODUODENOSCOPY (  EGD) WITH PROPOFOL N/A 06/02/2018   Procedure: ESOPHAGOGASTRODUODENOSCOPY (EGD) WITH PROPOFOL;  Surgeon: Sherrilyn Rist, MD;  Location: [facility] ENDOSCOPY;  Service: Gastroenterology;  Laterality: N/A;  . ESOPHAGOGASTRODUODENOSCOPY (EGD)  WITH PROPOFOL N/A 07/07/2018   Procedure: ESOPHAGOGASTRODUODENOSCOPY (EGD) WITH PROPOFOL;  Surgeon: Benancio Deeds, MD;  Location: [facility];  Service: Gastroenterology;  Laterality: N/A;  . ESOPHAGOGASTRODUODENOSCOPY (EGD) WITH PROPOFOL N/A 07/27/2018   Procedure: ESOPHAGOGASTRODUODENOSCOPY (EGD) WITH PROPOFOL;  Surgeon: Benancio Deeds, MD;  Location: [facility];  Service: Gastroenterology;  Laterality: N/A;  . ESOPHAGOGASTRODUODENOSCOPY (EGD) WITH PROPOFOL N/A 08/30/2018   Procedure: ESOPHAGOGASTRODUODENOSCOPY (EGD) WITH PROPOFOL;  Surgeon: Benancio Deeds, MD;  Location: [facility];  Service: Gastroenterology;  Laterality: N/A;  . ESOPHAGOGASTRODUODENOSCOPY (EGD) WITH PROPOFOL N/A 11/08/2018   Procedure: ESOPHAGOGASTRODUODENOSCOPY (EGD) WITH PROPOFOL;  Surgeon: Benancio Deeds, MD;  Location: [facility];  Service: Gastroenterology;  Laterality: N/A;  . ESOPHAGOGASTRODUODENOSCOPY (EGD) WITH PROPOFOL N/A 01/31/2019   Procedure: ESOPHAGOGASTRODUODENOSCOPY (EGD) WITH PROPOFOL;  Surgeon: Benancio Deeds, MD;  Location: [facility];  Service: Gastroenterology;  Laterality: N/A;  . ESOPHAGOGASTRODUODENOSCOPY (EGD) WITH PROPOFOL N/A 08/01/2019   Procedure: ESOPHAGOGASTRODUODENOSCOPY (EGD) WITH PROPOFOL;  Surgeon: Benancio Deeds, MD;  Location: [facility];  Service: Gastroenterology;  Laterality: N/A;  . ESOPHAGOGASTRODUODENOSCOPY (EGD) WITH PROPOFOL N/A 08/07/2020   Procedure: ESOPHAGOGASTRODUODENOSCOPY (EGD) WITH PROPOFOL;  Surgeon: Benancio Deeds, MD;  Location: [facility];  Service: Gastroenterology;  Laterality: N/A;  . ESOPHAGOGASTRODUODENOSCOPY (EGD) WITH PROPOFOL N/A 08/22/2021   Procedure: ESOPHAGOGASTRODUODENOSCOPY (EGD) WITH PROPOFOL;  Surgeon: Benancio Deeds, MD;  Location: [facility];  Service: Gastroenterology;  Laterality: N/A;  . IR EMBO ART  VEN HEMORR LYMPH EXTRAV  INC GUIDE ROADMAPPING  01/02/2022  . IR INTRAVASCULAR ULTRASOUND NON  CORONARY  11/05/2021  . IR PARACENTESIS  04/26/2018  . IR PARACENTESIS  05/18/2018  . IR PARACENTESIS  05/31/2018  . IR PARACENTESIS  06/30/2018  . IR PARACENTESIS  08/05/2018  . IR PARACENTESIS  08/31/2018  . IR PARACENTESIS  10/08/2018  . IR PARACENTESIS  10/28/2018  . IR PARACENTESIS  02/14/2019  . IR PARACENTESIS  03/09/2019  . IR PARACENTESIS  04/05/2019  . IR PARACENTESIS  05/03/2019  . IR PARACENTESIS  06/20/2019  . IR PARACENTESIS  07/19/2019  . IR PARACENTESIS  08/19/2019  . IR PARACENTESIS  09/27/2019  . IR PARACENTESIS  10/07/2019  . IR PARACENTESIS  10/28/2019  . IR PARACENTESIS  11/16/2019  . IR PARACENTESIS  12/30/2019  . IR PARACENTESIS  01/16/2020  . IR PARACENTESIS  02/07/2020  . IR PARACENTESIS  03/06/2020  . IR PARACENTESIS  04/04/2020  . IR PARACENTESIS  04/17/2020  . IR PARACENTESIS  05/04/2020  . IR PARACENTESIS  06/07/2020  . IR PARACENTESIS  06/28/2020  . IR PARACENTESIS  07/27/2020  . IR PARACENTESIS  08/20/2020  . IR PARACENTESIS  09/13/2020  . IR PARACENTESIS  10/04/2020  . IR PARACENTESIS  10/25/2020  . IR PARACENTESIS  11/07/2020  . IR PARACENTESIS  11/30/2020  . IR PARACENTESIS  12/13/2020  . IR PARACENTESIS  01/04/2021  . IR PARACENTESIS  01/25/2021  . IR PARACENTESIS  02/20/2021  . IR PARACENTESIS  03/20/2021  . IR PARACENTESIS  04/18/2021  . IR PARACENTESIS  05/14/2021  . IR PARACENTESIS  06/10/2021  . IR PARACENTESIS  06/27/2021  . IR PARACENTESIS  07/17/2021  . IR PARACENTESIS  08/12/2021  . IR PARACENTESIS  08/23/2021  . IR PARACENTESIS  09/13/2021  . IR PARACENTESIS  10/02/2021  .  IR PARACENTESIS  10/22/2021  . IR PARACENTESIS  11/01/2021  . IR PARACENTESIS  11/29/2021  . IR PARACENTESIS  12/18/2021  . IR PARACENTESIS  01/02/2022  . IR RADIOLOGIST EVAL & MGMT  12/23/2018  . IR RADIOLOGIST EVAL & MGMT  10/11/2021  . IR RADIOLOGIST EVAL & MGMT  12/27/2021  . IR THORACENTESIS ASP PLEURAL SPACE W/IMG GUIDE  12/10/2018  . IR THORACENTESIS ASP PLEURAL SPACE W/IMG GUIDE  01/21/2019  . IR  THORACENTESIS ASP PLEURAL SPACE W/IMG GUIDE  10/23/2021  . IR THORACENTESIS ASP PLEURAL SPACE W/IMG GUIDE  10/25/2021  . IR THORACENTESIS ASP PLEURAL SPACE W/IMG GUIDE  11/01/2021  . IR THORACENTESIS ASP PLEURAL SPACE W/IMG GUIDE  01/03/2022  . IR THORACENTESIS ASP PLEURAL SPACE W/IMG GUIDE  11/20/2022  . IR THROMBECT VENO MECH MOD SED  01/02/2022  . IR TIPS  11/01/2021  . IR TIPS REVISION MOD SED  01/02/2022  . IR TRANSCATH RETRIEVAL FB INCL GUIDANCE (MS)  01/02/2022  . IR US GUIDE VASC ACCESS RIGHT  11/01/2021  . IR US GUIDE VASC ACCESS RIGHT  11/01/2021  . IR US GUIDE VASC ACCESS RIGHT  11/01/2021  . IR US GUIDE VASC ACCESS RIGHT  01/02/2022  . RADIOLOGY WITH ANESTHESIA N/A 11/01/2021   Procedure: TIPS;  Surgeon: Sterling Big, MD;  Location: [facility] OR;  Service: Radiology;  Laterality: N/A;   OB History   No obstetric history on file.    Home Medications    Prior to Admission medications   Medication Sig Start Date End Date Taking? Authorizing Provider  acetaminophen (TYLENOL) 500 MG tablet Take 500-1,000 mg by mouth daily as needed for mild pain, headache or fever.   Yes [provider]  diclofenac Sodium (VOLTAREN) 1 % GEL Apply 2 g topically 4 (four) times daily as needed. 10/08/22  Yes Tilden Fossa, MD  lactulose (CHRONULAC) 10 GM/15ML solution Take 45 mLs (30 g total) by mouth 2 (two) times daily. 06/09/22  Yes Deeann Saint, MD  Multiple Vitamin (MULTIVITAMIN WITH MINERALS) TABS tablet Take 1 tablet by mouth daily. 04/25/22  Yes Glade Lloyd, MD  rifaximin (XIFAXAN) 550 MG TABS tablet TAKE 1 TABLET BY MOUTH TWICE DAILY. 09/29/22  Yes Armbruster, Willaim Rayas, MD    Family History Family History  Problem Relation Age of Onset  . Colon cancer Neg Hx   . Esophageal cancer Neg Hx   . Pancreatic cancer Neg Hx   . Stomach cancer Neg Hx    Social History Social History   Tobacco Use  . Smoking status: Former    Packs/day: 0.25    Years: 20.00    Additional pack years: 0.00     Total pack years: 5.00    Types: Cigarettes    Quit date: 07/24/2022    Years since quitting: 0.4  . Smokeless tobacco: Never  . Tobacco comments:    5 a day  Vaping Use  . Vaping Use: Former  Substance Use Topics  . Alcohol use: Not Currently  . Drug use: Not Currently   Allergies   Contrast media [iodinated contrast media], Latex, and Sulfa antibiotics  Review of Systems Review of Systems  Genitourinary:  Positive for flank pain.  Pertinent findings revealed after performing a 14 point review of systems has been noted in the history of present illness.  Physical Exam Vital Signs BP (!) 165/103 (BP Location: Right Arm)   Pulse 83   Temp 98.3 F (36.8 C) (Oral)   Resp 18  SpO2 90%   No data found.  Physical Exam  Visual Acuity Right Eye Distance:   Left Eye Distance:   Bilateral Distance:    Right Eye Near:   Left Eye Near:    Bilateral Near:     UC Couse / Diagnostics / Procedures:     Radiology No results found.  Procedures Procedures (including critical care time) EKG  Pending results:  Labs Reviewed  POCT URINALYSIS DIP (MANUAL ENTRY)    Medications Ordered in UC: Medications - No data to display  UC Diagnoses / Final Clinical Impressions(s)   I have reviewed the triage vital signs and the nursing notes.  Pertinent labs & imaging results that were available during my care of the patient were reviewed by me and considered in my medical decision making (see chart for details).    Final diagnoses:  None   ***  Please see discharge instructions below for details of plan of care as provided to patient. ED Prescriptions   None    PDMP not reviewed this encounter.  Pending results:  Labs Reviewed  POCT URINALYSIS DIP (MANUAL ENTRY)    Discharge Instructions: Discharge Instructions   None     Disposition Upon Discharge:  Condition: stable for discharge home  Patient presented with an acute illness with associated systemic  symptoms and significant discomfort requiring urgent management. In my opinion, this is a condition that a prudent lay person (someone who possesses an average knowledge of health and medicine) may potentially expect to result in complications if not addressed urgently such as respiratory distress, impairment of bodily function or dysfunction of bodily organs.   Routine symptom specific, illness specific and/or disease specific instructions were discussed with the patient and/or caregiver at length.   As such, the patient has been evaluated and assessed, work-up was performed and treatment was provided in alignment with urgent care protocols and evidence based medicine.  Patient/parent/caregiver has been advised that the patient may require follow up for further testing and treatment if the symptoms continue in spite of treatment, as clinically indicated and appropriate.  Patient/parent/caregiver has been advised to return to the [facility] or PCP if no better; to PCP or the Emergency Department if new signs and symptoms develop, or if the current signs or symptoms continue to change or worsen for further workup, evaluation and treatment as clinically indicated and appropriate  The patient will follow up with their current PCP if and as advised. If the patient does not currently have a PCP we will assist them in obtaining one.   The patient may need specialty follow up if the symptoms continue, in spite of conservative treatment and management, for further workup, evaluation, consultation and treatment as clinically indicated and appropriate.  Patient/parent/caregiver verbalized understanding and agreement of plan as discussed.  All questions were addressed during visit.  Please see discharge instructions below for further details of plan.  This office note has been dictated using Teaching laboratory technician.  Unfortunately, this method of dictation can sometimes lead to typographical or grammatical  errors.  I apologize for your inconvenience in advance if this occurs.  Please do not hesitate to reach out to me if clarification is needed.

## 2022-12-30 NOTE — ED Triage Notes (Signed)
Pt states she has right flank pain on and off for 2 weeks. She states is not constant. She hasn't taken any meds for pain. She has cirrhosis of liver so she is limited on what meds she can take.

## 2022-12-30 NOTE — Discharge Instructions (Addendum)
The x-ray of your lumbar spine showed that your L4-L5 disc has slipped out of place due to evidence of arthritis in that area.  The disc also appears to be mildly degenerated.  Believe that this is the cause of your right-sided lower back pain and sciatica.  I think it would be to your benefit to follow-up with a spine specialist to discuss treatment options for this.  In the meantime, I can certainly make some recommendations to help you get your pain under control.  This evening, you can begin taking baclofen 10 mg.  This is a highly effective muscle relaxer and antispasmodic which should continue to provide you with relaxation of your tense muscles, allow you to sleep well and to keep your pain under control.  You can continue taking this medication 3 times daily as you need to.  If you find that this medication makes you too sleepy, you can break them in half for your daytime doses and, if needed double them for your nighttime dose.  Do not take more than 30 mg of baclofen in a 24-hour period.   Tomorrow morning, please begin taking methylprednisolone.  Please take 1 full row tablets at once with your breakfast meal.  This will continue to keep your inflammation under control until your body can heal.   During the day, please set aside time to apply ice to the affected area 4 times daily for 20 minutes each application.  This can be achieved by using a bag of frozen peas or corn, a Ziploc bag filled with ice and water, or Ziploc bag filled with half rubbing alcohol and half Dawn dish detergent, frozen into a slush.  Please be careful not to apply ice directly to your skin, always place a soft cloth between you and the ice pack.  Over-the-counter products such as IcyHot and Biofreeze do not work nearly as well.   Please consider discussing referral to physical therapy with your primary care provider.  Physical therapist are very good at teasing out the underlying cause of acute musculoskeletal pain and  helping with prevention of future recurrences.   Please avoid attempts to stretch or strengthen the affected area until you are feeling completely pain-free.  Attempts to do so will only prolong the healing process.   Thank you for visiting [facility] today.  We appreciate the opportunity to participate in your care.

## 2023-01-09 ENCOUNTER — Telehealth: Payer: Self-pay | Admitting: Gastroenterology

## 2023-01-09 DIAGNOSIS — Z95828 Presence of other vascular implants and grafts: Secondary | ICD-10-CM

## 2023-01-09 DIAGNOSIS — J90 Pleural effusion, not elsewhere classified: Secondary | ICD-10-CM

## 2023-01-09 DIAGNOSIS — K7682 Hepatic encephalopathy: Secondary | ICD-10-CM

## 2023-01-09 DIAGNOSIS — R609 Edema, unspecified: Secondary | ICD-10-CM

## 2023-01-09 MED ORDER — SPIRONOLACTONE 100 MG PO TABS: 100.0000 mg | ORAL_TABLET | Freq: Every day | ORAL | 0 refills | Status: AC

## 2023-01-09 MED ORDER — FUROSEMIDE 40 MG PO TABS: 40.0000 mg | ORAL_TABLET | Freq: Every day | ORAL | 0 refills | Status: AC

## 2023-01-09 NOTE — Telephone Encounter (Signed)
Inbound call from patient stating that she has fluid built up on her legs that she can hardly walk. Requesting a call back to discuss.   Patient was scheduled for follow up with Dr. Adela Lank on 11/12 at 3:20

## 2023-01-09 NOTE — Telephone Encounter (Signed)
Called and spoke with patient regarding recommendations. Pt is aware that a weeks worth of diuretics (Lasix and Aldactone) will be sent to her pharmacy, pt requested these be sent to [facility] pharmacy. Pt will come in next Friday for lab work and chest x-ray. I will call her to remind her. Pt verbalized understanding and had no concerns at the end of the call.

## 2023-01-09 NOTE — Telephone Encounter (Signed)
Okay. She has been on diuretics in the past but stopped post TIPS.  Sounds like swelling in her legs only and not in her abdomen. If she wants to take something for this can do lasix 40mg  / day and aldactone 100mg  / day, hopefully this helps her legs. Would ask her to come in for a BMET next week. Would give her only one week of meds to make sure she comes to the lab next week for follow up, she has not come in when asked in the past. Agree with CXR and referral to pulmonary for effusion noted in the past. Thanks

## 2023-01-09 NOTE — Telephone Encounter (Signed)
Returned call to patient. Pt states that she has had swelling in her lower extremities for the last couple of months. Calves, ankles, and feet and swollen and tight. No warmth or redness, does hurt to walk due to the swelling. Pt states that her PCP gave her an injection for pain on Tuesday, 12/30/22. Pt is not on any diuretics at this time. Pt reports that she takes Xifaxan BID and Lactulose BID as prescribed. Pt denies any SOB or ascites. I informed pt that I was not able to reach her last month regarding fluid analysis from thoracentesis. Pt is aware that referral to pulmonology was placed and closed because they were not able to reach her. I asked pt if she was willing to see pulmonology for further evaluation and she stated that she was. I told pt that I will place a new referral, but she needs to answer when they call to schedule. Pt has also been advised that you wanted to repeat her chest x-ray, she can go by the x-ray dept in the basement at her convenience to have that completed. Pt has been elevating her legs, she is working on getting some compression socks. Please advise, thanks.  X-ray order in epic.  Ambulatory referral to Pulmonology in epic.

## 2023-01-16 ENCOUNTER — Telehealth: Payer: Self-pay

## 2023-01-16 NOTE — Telephone Encounter (Signed)
Left patient a detailed vm letting her know that she is due for labs and x-ray today. I advised that it is important for her to come in today since we placed her on diuretics last week. I advised pt to come today for labs and x-ray.

## 2023-01-16 NOTE — Telephone Encounter (Signed)
-----   Message from Nurse [place] P sent at 01/09/2023  4:48 PM EDT ----- Regarding: 1-WEEK LAB REMINDER BMET and Chest X-ray - orders are in epic

## 2023-01-19 ENCOUNTER — Other Ambulatory Visit (INDEPENDENT_AMBULATORY_CARE_PROVIDER_SITE_OTHER): Payer: 59

## 2023-01-19 ENCOUNTER — Ambulatory Visit (INDEPENDENT_AMBULATORY_CARE_PROVIDER_SITE_OTHER)
Admission: RE | Admit: 2023-01-19 | Discharge: 2023-01-19 | Disposition: A | Discharge status: Home / Self Care | Payer: 59 | Source: Ambulatory Visit | Attending: Gastroenterology | Admitting: Gastroenterology

## 2023-01-19 DIAGNOSIS — Z95828 Presence of other vascular implants and grafts: Secondary | ICD-10-CM

## 2023-01-19 DIAGNOSIS — R609 Edema, unspecified: Secondary | ICD-10-CM

## 2023-01-19 DIAGNOSIS — J90 Pleural effusion, not elsewhere classified: Secondary | ICD-10-CM | POA: Diagnosis not present

## 2023-01-19 DIAGNOSIS — K7682 Hepatic encephalopathy: Secondary | ICD-10-CM | POA: Diagnosis not present

## 2023-01-19 LAB — BASIC METABOLIC PANEL
BUN: 9 mg/dL (ref 6–23)
CO2: 25 mEq/L (ref 19–32)
Calcium: 9.3 mg/dL (ref 8.4–10.5)
Chloride: 106 mEq/L (ref 96–112)
Creatinine, Ser: 0.72 mg/dL (ref 0.40–1.20)
GFR: 92.4 mL/min (ref >60.00)
Glucose, Bld: 125 mg/dL — ABNORMAL HIGH (ref 70–99)
Potassium: 3.4 mEq/L — ABNORMAL LOW (ref 3.5–5.1)
Sodium: 140 mEq/L (ref 135–145)

## 2023-01-19 NOTE — Telephone Encounter (Signed)
Called and spoke with patient. I advised pt that we called last week to remind her to come in for BMET since we sent a weeks worth of diuretics. Pt stated that she is not on any diuretics, I again informed her that we sent a prescription for Lasix and Aldactone on 01/09/23 because she called in about leg swelling. Pt states that the pharmacy never called her about the prescriptions so she did not take them. Pt denies any swelling at this time. I told her that she still needs to come for x-ray. No appt needed, I provided her with the office address. Pt verbalized understanding and had no concerns at the end of the call.

## 2023-01-19 NOTE — Telephone Encounter (Signed)
Thanks [place], appreciate the follow up

## 2023-01-28 ENCOUNTER — Other Ambulatory Visit: Payer: Self-pay

## 2023-01-28 DIAGNOSIS — R0602 Shortness of breath: Secondary | ICD-10-CM

## 2023-01-28 DIAGNOSIS — J90 Pleural effusion, not elsewhere classified: Secondary | ICD-10-CM

## 2023-01-28 DIAGNOSIS — Z95828 Presence of other vascular implants and grafts: Secondary | ICD-10-CM

## 2023-01-29 ENCOUNTER — Other Ambulatory Visit: Payer: 59

## 2023-01-30 ENCOUNTER — Inpatient Hospital Stay (HOSPITAL_COMMUNITY)
Admission: RE | Admit: 2023-01-30 | Discharge: 2023-01-30 | Disposition: A | Discharge status: Home / Self Care | Payer: 59 | Source: Ambulatory Visit | Attending: Gastroenterology | Admitting: Gastroenterology

## 2023-02-03 ENCOUNTER — Telehealth: Payer: 59

## 2023-02-06 NOTE — Telephone Encounter (Signed)
Inbound call from specialty pharmacy requesting medication refill for xifaxin. Please advise.

## 2023-02-10 ENCOUNTER — Other Ambulatory Visit: Payer: Self-pay

## 2023-02-10 DIAGNOSIS — K7682 Hepatic encephalopathy: Secondary | ICD-10-CM

## 2023-02-10 MED ORDER — RIFAXIMIN 550 MG PO TABS
550.0000 mg | ORAL_TABLET | Freq: Two times a day (BID) | ORAL | 5 refills | Status: DC
Start: 2023-02-10 — End: 2023-05-05

## 2023-02-10 NOTE — Telephone Encounter (Signed)
Xifaxan sent to [facility] as requested.  550 mg BID for hepatic encephalopathy

## 2023-03-24 ENCOUNTER — Telehealth: Payer: Self-pay | Admitting: Pharmacy Technician

## 2023-03-24 NOTE — Telephone Encounter (Signed)
Pharmacy Patient Advocate Encounter   Received notification from Fax that prior authorization for XIFAXAN 550MG  is required/requested.   Insurance verification completed.   [employer]  PA request received from [facility] to submit medical documentation to submit for PA. Nothing else required at this time.  Faxed to [phone] Phone:[phone]

## 2023-03-31 NOTE — Telephone Encounter (Signed)
Please resubmit PA for Xifaxan with correct diagnosis of Hepatic Encephalopathy. This is a continuation of Xifaxan 550 mg BID therapy for HE. Thank you

## 2023-03-31 NOTE — Telephone Encounter (Signed)
Pharmacy Patient Advocate Encounter   Received notification from Fax that prior authorization for XIFAXAN 550MG  is required/requested.   Insurance verification completed.   The patient is insured through [employer] .   Per test claim: PA required; PA submitted to [facility] via CoverMyMeds Key/confirmation #/EOC [id number] Status is pending

## 2023-04-06 NOTE — Telephone Encounter (Signed)
Pharmacy Patient Advocate Encounter  Received notification from [facility] that Prior Authorization for [facility] 550MG  has been DENIED.  Full denial letter will be uploaded to the media tab. See denial reason below.   PA #/Case ID/Reference #:

## 2023-04-07 ENCOUNTER — Other Ambulatory Visit (HOSPITAL_COMMUNITY): Payer: Self-pay

## 2023-04-07 NOTE — Telephone Encounter (Signed)
Pharmacy Patient Advocate Encounter  Received notification from [facility] that APPEAL for XIFAXAN 550MG  has been APPROVED from 1.1.24 to 12.31.25. Ran test claim, Copay is $0. This test claim was processed through [facility]- copay amounts may vary at other pharmacies due to pharmacy/plan contracts, or as the patient moves through the different stages of their insurance plan.   PA #/Case ID/Reference #: [id number]

## 2023-04-07 NOTE — Telephone Encounter (Signed)
Appeal has been submitted. Will advise when response is received or follow up in 1 week. Please be advised that most companies may take 30 days to make a decision.   Dellie Burns, PharmD Clinical Pharmacist Patient Access Team [facility]  Direct Dial: [phone]

## 2023-05-04 DIAGNOSIS — D6959 Other secondary thrombocytopenia: Secondary | ICD-10-CM | POA: Diagnosis not present

## 2023-05-04 DIAGNOSIS — K860 Alcohol-induced chronic pancreatitis: Secondary | ICD-10-CM | POA: Diagnosis not present

## 2023-05-04 DIAGNOSIS — F17200 Nicotine dependence, unspecified, uncomplicated: Secondary | ICD-10-CM | POA: Diagnosis not present

## 2023-05-04 DIAGNOSIS — Z114 Encounter for screening for human immunodeficiency virus [HIV]: Secondary | ICD-10-CM | POA: Diagnosis not present

## 2023-05-04 DIAGNOSIS — F1020 Alcohol dependence, uncomplicated: Secondary | ICD-10-CM | POA: Diagnosis not present

## 2023-05-04 DIAGNOSIS — E559 Vitamin D deficiency, unspecified: Secondary | ICD-10-CM | POA: Diagnosis not present

## 2023-05-04 DIAGNOSIS — K7031 Alcoholic cirrhosis of liver with ascites: Secondary | ICD-10-CM | POA: Diagnosis not present

## 2023-05-04 DIAGNOSIS — Z79899 Other long term (current) drug therapy: Secondary | ICD-10-CM | POA: Diagnosis not present

## 2023-05-04 DIAGNOSIS — Z1159 Encounter for screening for other viral diseases: Secondary | ICD-10-CM | POA: Diagnosis not present

## 2023-05-05 ENCOUNTER — Other Ambulatory Visit (INDEPENDENT_AMBULATORY_CARE_PROVIDER_SITE_OTHER): Payer: Medicare HMO

## 2023-05-05 ENCOUNTER — Ambulatory Visit (INDEPENDENT_AMBULATORY_CARE_PROVIDER_SITE_OTHER)
Admission: RE | Admit: 2023-05-05 | Discharge: 2023-05-05 | Disposition: A | Discharge status: Home / Self Care | Payer: Medicare HMO | Source: Ambulatory Visit | Attending: Gastroenterology | Admitting: Gastroenterology

## 2023-05-05 ENCOUNTER — Ambulatory Visit (INDEPENDENT_AMBULATORY_CARE_PROVIDER_SITE_OTHER): Payer: Medicare HMO | Admitting: Gastroenterology

## 2023-05-05 ENCOUNTER — Encounter: Payer: Self-pay | Admitting: Gastroenterology

## 2023-05-05 VITALS — BP 130/80 | HR 68 | Ht 64.0 in | Wt 151.0 lb

## 2023-05-05 DIAGNOSIS — J90 Pleural effusion, not elsewhere classified: Secondary | ICD-10-CM

## 2023-05-05 DIAGNOSIS — K7030 Alcoholic cirrhosis of liver without ascites: Secondary | ICD-10-CM | POA: Diagnosis not present

## 2023-05-05 DIAGNOSIS — K7031 Alcoholic cirrhosis of liver with ascites: Secondary | ICD-10-CM | POA: Diagnosis not present

## 2023-05-05 DIAGNOSIS — K7682 Hepatic encephalopathy: Secondary | ICD-10-CM

## 2023-05-05 DIAGNOSIS — Z95828 Presence of other vascular implants and grafts: Secondary | ICD-10-CM

## 2023-05-05 DIAGNOSIS — K7460 Unspecified cirrhosis of liver: Secondary | ICD-10-CM | POA: Diagnosis not present

## 2023-05-05 LAB — COMPREHENSIVE METABOLIC PANEL
ALT: 10 U/L (ref 0–35)
AST: 24 U/L (ref 0–37)
Albumin: 3.6 g/dL (ref 3.5–5.2)
Alkaline Phosphatase: 159 U/L — ABNORMAL HIGH (ref 39–117)
BUN: 8 mg/dL (ref 6–23)
CO2: 27 meq/L (ref 19–32)
Calcium: 9.3 mg/dL (ref 8.4–10.5)
Chloride: 107 meq/L (ref 96–112)
Creatinine, Ser: 0.72 mg/dL (ref 0.40–1.20)
GFR: 92.21 mL/min (ref >60.00)
Glucose, Bld: 90 mg/dL (ref 70–99)
Potassium: 3.7 meq/L (ref 3.5–5.1)
Sodium: 142 meq/L (ref 135–145)
Total Bilirubin: 1.1 mg/dL (ref 0.2–1.2)
Total Protein: 6.8 g/dL (ref 6.0–8.3)

## 2023-05-05 LAB — PROTIME-INR
INR: 1.3 {ratio} — ABNORMAL HIGH (ref 0.8–1.0)
Prothrombin Time: 13.9 s — ABNORMAL HIGH (ref 9.6–13.1)

## 2023-05-05 MED ORDER — LACTULOSE 10 GM/15ML PO SOLN
30.0000 g | Freq: Two times a day (BID) | ORAL | 3 refills | Status: AC
Start: 2023-05-05 — End: ?

## 2023-05-05 MED ORDER — RIFAXIMIN 550 MG PO TABS
550.0000 mg | ORAL_TABLET | Freq: Two times a day (BID) | ORAL | 3 refills | Status: AC
Start: 2023-05-05 — End: ?

## 2023-05-05 NOTE — Progress Notes (Signed)
HPI :  57 y/o female here for a follow up visit for decompensated alcoholic cirrhosis. Last seen in May 2024.  Cirrhosis history:  Decompensated cirrhosis secondary to alcohol - course complicated by significant esophageal varices leading to bleeding in the past.  She has had a few admissions at the end of 2019 and required over 25 bands to eradicate varices.  Her course is also been complicated by significant recurrent ascites and hepatic hydrothorax. Recall she has not been on nonselective beta-blockade in the past due to refractory ascites.  She was noncompliant with diuretics, ultimately got admitted with refractory hepatic hydrothorax and respiratory distress. Eventually underwent TIPS per IR in May 2023 which was complicated by early thrombosis necessitating TIPS revision in July 2023. TIPS revision was successful and the large portosystemic collaterals were embolized at the time of revision.      SINCE LAST VISIT:  I last saw her in May.  She was doing pretty well at that time in regards to her ascites since the TIPS.  We had continued her lactulose and rifaximin for history of suspected hepatic encephalopathy.  The main issue at the time was some intermittent shortness of breath at baseline that had not improved and she had a persistent pleural effusion on the right side.  She was referred to IR and had a thoracentesis on May 30, 1.6 L of pleural fluid was removed.  Studies negative for infection.  We had discussed going back on diuretics, sounds like she took for a very short period of time but then stopped altogether.  She came back for repeat chest x-ray in August which showed worsening/persistent effusion.  I referred her to [facility] pulmonary, looks like she never followed through with that appointment or the repeat thoracentesis that was also ordered at that time.  We have not heard from her since then.  She continues to have some intermittent shortness of breath that bothers her.  She  denies any chest pain.  She has no problems with lower extremity edema.  No ascites.  No blood in her stool.  She reminds me she has been completely abstinent from alcohol since have last seen her which has been outstanding.  She had a really hard time stopping alcohol and this took several years.  She has been abstinent from alcohol she thinks upwards of a year now at this point.  She has not used any tobacco products as well, she quit smoking.  She last had labs and HCC screening in May  Most recent endoscopies: EGD 07/07/18 - 4 bands placed EGD 07/27/18 - 3 bands placed EGD 08/30/18 - 3 bands placed  EGD 11/08/18 -no varices amenable to banding, portal hypertensive gastritis EGD 01/31/19 - portal hypertensive gastritis, no varices   EGD 08/01/19 - There was extensive scarring in the distal esophagus secondary to prior banding, but no obvious varices noted today. The exam of the esophagus was otherwise normal. Diffuse severely congested mucosa was found in the gastric antrum secondary to portal hypertension.. The exam of the stomach was otherwise normal. No gastric varices. The duodenal bulb and second portion of the duodenum were normal.     Colonoscopy 08/01/19 - Preparation of the colon was fair - most of the colon was well visualized - a small portion of the transverse colon, and then the splenic flexure / proximal descending colon had some residual stool that could not be cleared but nothing obvious noted in these areas. - Hemorrhoids found on perianal exam. -  The examined portion of the ileum was normal. - Diffuse colonic congestion / edema in the entire examined colon from portal hypertension. - Internal hemorrhoids. - The examination was otherwise normal.     EGD 08/07/20: Esophagogastric landmarks identified. - Normal esophagus otherwise - scarring from prior banding noted but appreciable varices. - Antral gastritis with an erosion. - Normal stomach otherwise - biopsies taken to rule  out H pylori - A single duodenal polyp. Resected and retrieved. - Normal duodenum otherwise      EGD 08/22/21: Esophagogastric landmarks identified. - Trace esophageal varices in the lower esophagus without any high risk stigmata. - Multiple areas of scarring from prior banding - A large amount of food (residue) in the stomach, exam of the stomach and duodenum not performed once this was seen and procedure stopped.     [place] RUQ with doppler 06/25/22: IMPRESSION: Directed duplex of the hepatic vasculature demonstrates patent tips and portal venous system. Right-sided pleural effusion, with scant ascites.     CT chest 11/06/22: IMPRESSION: Moderate to large right pleural effusion. There is interval decrease in the amount of right pleural effusion and clearing of ascites. There is atelectasis in right middle lobe and right lower lobe. Rest of the lung fields shows no infiltrates or discrete nodules.   Cirrhosis of liver. Gallbladder stones. Interval placement of tips shunt in the liver and interval placement of vascular coils in epigastrium.     CT renal stone study 11/06/22: IMPRESSION: Changes of cirrhosis, status post tips. Cholelithiasis. Changes of chronic pancreatitis.  No evidence of acute pancreatitis. Small amount of free fluid in the pelvis. Large right pleural effusion with compressive atelectasis in the right middle lobe and right lower lobe, stable since prior study.   No acute findings.     RUQ [place] 11/06/22: MPRESSION: 1. Cholelithiasis with slight gallbladder wall thickening but negative sonographic Murphy. Findings are nonspecific and could be seen with liver disease, generalized edematous state, and cholecystitis. If further imaging is required, correlation with nuclear medicine hepatobiliary imaging could be obtained. 2. Slightly enlarged common bile duct. 3. Cirrhotic liver. 4. Right pleural effusion. Trace ascites.     CT renal stone study  10/08/22: IMPRESSION: 1. No acute findings are noted in the abdomen or pelvis to account for the patient's symptoms. 2. Cholelithiasis without definite imaging findings to suggest an acute cholecystitis at this time. 3. Advanced hepatic cirrhosis status post TIPS. 4. Trace volume of ascites. 5. Large chronic right pleural effusion with chronic passive atelectasis in the right middle and lower lobes, similar to prior study from 2020. 6. Additional incidental findings, as above.     Thoracentesis 5/30 IMPRESSION: Successful ultrasound guided right thoracentesis yielding 1.6 L of pleural fluid.     CXR August 2024 - enlarging effusion     Past Medical History:  Diagnosis Date   Alcoholic liver disease (HCC)    Chronic back pain    on disability   Cirrhosis of liver (HCC)    Esophageal varices (HCC)    GERD (gastroesophageal reflux disease)    Headache    HEPATIC Hydrothorax    S/P tips   Jaundice      Past Surgical History:  Procedure Laterality Date   BACK SURGERY     L3-L4   BIOPSY  08/07/2020   Procedure: BIOPSY;  Surgeon: Benancio Deeds, MD;  Location: [facility] ENDOSCOPY;  Service: Gastroenterology;;   COLONOSCOPY WITH PROPOFOL N/A 08/01/2019   Procedure: COLONOSCOPY WITH PROPOFOL;  Surgeon: Ileene Patrick  P, MD;  Location: [facility] ENDOSCOPY;  Service: Gastroenterology;  Laterality: N/A;   ESOPHAGEAL BANDING  04/25/2018   Procedure: ESOPHAGEAL BANDING;  Surgeon: Benancio Deeds, MD;  Location: [facility] ENDOSCOPY;  Service: Gastroenterology;;   ESOPHAGEAL BANDING  04/27/2018   Procedure: ESOPHAGEAL BANDING;  Surgeon: Rachael Fee, MD;  Location: [facility] ENDOSCOPY;  Service: Endoscopy;;   ESOPHAGEAL BANDING  06/02/2018   Procedure: ESOPHAGEAL BANDING;  Surgeon: Sherrilyn Rist, MD;  Location: [facility] ENDOSCOPY;  Service: Gastroenterology;;   ESOPHAGEAL BANDING N/A 07/07/2018   Procedure: ESOPHAGEAL BANDING;  Surgeon: Benancio Deeds, MD;  Location: [facility] ENDOSCOPY;   Service: Gastroenterology;  Laterality: N/A;   ESOPHAGEAL BANDING  07/27/2018   Procedure: ESOPHAGEAL BANDING;  Surgeon: Benancio Deeds, MD;  Location: [facility] ENDOSCOPY;  Service: Gastroenterology;;   ESOPHAGEAL BANDING  08/30/2018   Procedure: ESOPHAGEAL BANDING;  Surgeon: Benancio Deeds, MD;  Location: [facility] ENDOSCOPY;  Service: Gastroenterology;;   ESOPHAGOGASTRODUODENOSCOPY N/A 04/27/2018   Procedure: ESOPHAGOGASTRODUODENOSCOPY (EGD);  Surgeon: Rachael Fee, MD;  Location: [facility] ENDOSCOPY;  Service: Endoscopy;  Laterality: N/A;   ESOPHAGOGASTRODUODENOSCOPY (EGD) WITH PROPOFOL N/A 04/25/2018   Procedure: ESOPHAGOGASTRODUODENOSCOPY (EGD) WITH PROPOFOL;  Surgeon: Benancio Deeds, MD;  Location: [facility] ENDOSCOPY;  Service: Gastroenterology;  Laterality: N/A;   ESOPHAGOGASTRODUODENOSCOPY (EGD) WITH PROPOFOL N/A 06/02/2018   Procedure: ESOPHAGOGASTRODUODENOSCOPY (EGD) WITH PROPOFOL;  Surgeon: Sherrilyn Rist, MD;  Location: [facility] ENDOSCOPY;  Service: Gastroenterology;  Laterality: N/A;   ESOPHAGOGASTRODUODENOSCOPY (EGD) WITH PROPOFOL N/A 07/07/2018   Procedure: ESOPHAGOGASTRODUODENOSCOPY (EGD) WITH PROPOFOL;  Surgeon: Benancio Deeds, MD;  Location: [facility] ENDOSCOPY;  Service: Gastroenterology;  Laterality: N/A;   ESOPHAGOGASTRODUODENOSCOPY (EGD) WITH PROPOFOL N/A 07/27/2018   Procedure: ESOPHAGOGASTRODUODENOSCOPY (EGD) WITH PROPOFOL;  Surgeon: Benancio Deeds, MD;  Location: [facility] ENDOSCOPY;  Service: Gastroenterology;  Laterality: N/A;   ESOPHAGOGASTRODUODENOSCOPY (EGD) WITH PROPOFOL N/A 08/30/2018   Procedure: ESOPHAGOGASTRODUODENOSCOPY (EGD) WITH PROPOFOL;  Surgeon: Benancio Deeds, MD;  Location: [facility] ENDOSCOPY;  Service: Gastroenterology;  Laterality: N/A;   ESOPHAGOGASTRODUODENOSCOPY (EGD) WITH PROPOFOL N/A 11/08/2018   Procedure: ESOPHAGOGASTRODUODENOSCOPY (EGD) WITH PROPOFOL;  Surgeon: Benancio Deeds, MD;  Location: [facility] ENDOSCOPY;  Service: Gastroenterology;  Laterality: N/A;    ESOPHAGOGASTRODUODENOSCOPY (EGD) WITH PROPOFOL N/A 01/31/2019   Procedure: ESOPHAGOGASTRODUODENOSCOPY (EGD) WITH PROPOFOL;  Surgeon: Benancio Deeds, MD;  Location: [facility] ENDOSCOPY;  Service: Gastroenterology;  Laterality: N/A;   ESOPHAGOGASTRODUODENOSCOPY (EGD) WITH PROPOFOL N/A 08/01/2019   Procedure: ESOPHAGOGASTRODUODENOSCOPY (EGD) WITH PROPOFOL;  Surgeon: Benancio Deeds, MD;  Location: [facility] ENDOSCOPY;  Service: Gastroenterology;  Laterality: N/A;   ESOPHAGOGASTRODUODENOSCOPY (EGD) WITH PROPOFOL N/A 08/07/2020   Procedure: ESOPHAGOGASTRODUODENOSCOPY (EGD) WITH PROPOFOL;  Surgeon: Benancio Deeds, MD;  Location: [facility] ENDOSCOPY;  Service: Gastroenterology;  Laterality: N/A;   ESOPHAGOGASTRODUODENOSCOPY (EGD) WITH PROPOFOL N/A 08/22/2021   Procedure: ESOPHAGOGASTRODUODENOSCOPY (EGD) WITH PROPOFOL;  Surgeon: Benancio Deeds, MD;  Location: [facility] ENDOSCOPY;  Service: Gastroenterology;  Laterality: N/A;   IR EMBO ART  VEN HEMORR LYMPH EXTRAV  INC GUIDE ROADMAPPING  01/02/2022   IR INTRAVASCULAR ULTRASOUND NON CORONARY  11/05/2021   IR PARACENTESIS  04/26/2018   IR PARACENTESIS  05/18/2018   IR PARACENTESIS  05/31/2018   IR PARACENTESIS  06/30/2018   IR PARACENTESIS  08/05/2018   IR PARACENTESIS  08/31/2018   IR PARACENTESIS  10/08/2018   IR PARACENTESIS  10/28/2018   IR PARACENTESIS  02/14/2019   IR PARACENTESIS  03/09/2019   IR PARACENTESIS  04/05/2019   IR PARACENTESIS  05/03/2019   IR PARACENTESIS  06/20/2019   IR PARACENTESIS  07/19/2019   IR PARACENTESIS  08/19/2019   IR PARACENTESIS  09/27/2019   IR PARACENTESIS  10/07/2019   IR PARACENTESIS  10/28/2019   IR PARACENTESIS  11/16/2019   IR PARACENTESIS  12/30/2019   IR PARACENTESIS  01/16/2020   IR PARACENTESIS  02/07/2020   IR PARACENTESIS  03/06/2020   IR PARACENTESIS  04/04/2020   IR PARACENTESIS  04/17/2020   IR PARACENTESIS  05/04/2020   IR PARACENTESIS  06/07/2020   IR PARACENTESIS  06/28/2020   IR PARACENTESIS  07/27/2020   IR PARACENTESIS   08/20/2020   IR PARACENTESIS  09/13/2020   IR PARACENTESIS  10/04/2020   IR PARACENTESIS  10/25/2020   IR PARACENTESIS  11/07/2020   IR PARACENTESIS  11/30/2020   IR PARACENTESIS  12/13/2020   IR PARACENTESIS  01/04/2021   IR PARACENTESIS  01/25/2021   IR PARACENTESIS  02/20/2021   IR PARACENTESIS  03/20/2021   IR PARACENTESIS  04/18/2021   IR PARACENTESIS  05/14/2021   IR PARACENTESIS  06/10/2021   IR PARACENTESIS  06/27/2021   IR PARACENTESIS  07/17/2021   IR PARACENTESIS  08/12/2021   IR PARACENTESIS  08/23/2021   IR PARACENTESIS  09/13/2021   IR PARACENTESIS  10/02/2021   IR PARACENTESIS  10/22/2021   IR PARACENTESIS  11/01/2021   IR PARACENTESIS  11/29/2021   IR PARACENTESIS  12/18/2021   IR PARACENTESIS  01/02/2022   IR RADIOLOGIST EVAL & MGMT  12/23/2018   IR RADIOLOGIST EVAL & MGMT  10/11/2021   IR RADIOLOGIST EVAL & MGMT  12/27/2021   IR THORACENTESIS ASP PLEURAL SPACE W/IMG GUIDE  12/10/2018   IR THORACENTESIS ASP PLEURAL SPACE W/IMG GUIDE  01/21/2019   IR THORACENTESIS ASP PLEURAL SPACE W/IMG GUIDE  10/23/2021   IR THORACENTESIS ASP PLEURAL SPACE W/IMG GUIDE  10/25/2021   IR THORACENTESIS ASP PLEURAL SPACE W/IMG GUIDE  11/01/2021   IR THORACENTESIS ASP PLEURAL SPACE W/IMG GUIDE  01/03/2022   IR THORACENTESIS ASP PLEURAL SPACE W/IMG GUIDE  11/20/2022   IR THROMBECT VENO MECH MOD SED  01/02/2022   IR TIPS  11/01/2021   IR TIPS REVISION MOD SED  01/02/2022   IR TRANSCATH RETRIEVAL FB INCL GUIDANCE (MS)  01/02/2022   IR US GUIDE VASC ACCESS RIGHT  11/01/2021   IR US GUIDE VASC ACCESS RIGHT  11/01/2021   IR US GUIDE VASC ACCESS RIGHT  11/01/2021   IR US GUIDE VASC ACCESS RIGHT  01/02/2022   RADIOLOGY WITH ANESTHESIA N/A 11/01/2021   Procedure: TIPS;  Surgeon: Sterling Big, MD;  Location[facility];  Service: Radiology;  Laterality: N/A;   Family History  Problem Relation Age of Onset   Colon cancer Neg Hx    Esophageal cancer Neg Hx    Pancreatic cancer Neg Hx    Stomach cancer Neg Hx    Social History    Tobacco Use   Smoking status: Former    Current packs/day: 0.00    Average packs/day: 0.3 packs/day for 20.0 years (5.0 ttl pk-yrs)    Types: Cigarettes    Start date: 07/24/2002    Quit date: 07/24/2022    Years since quitting: 0.7   Smokeless tobacco: Never   Tobacco comments:    5 a day  Vaping Use   Vaping status: Former  Substance Use Topics   Alcohol use: Not Currently   Drug use: Not Currently   Current Outpatient Medications  Medication Sig Dispense Refill  methylPREDNISolone (MEDROL DOSEPAK) 4 MG TBPK tablet Take 24 mg on day 1, 20 mg on day 2, 16 mg on day 3, 12 mg on day 4, 8 mg on day 5, 4 mg on day 6.  Take all tablets in each row at once, do not spread tablets out throughout the day. 21 tablet 0   Multiple Vitamin (MULTIVITAMIN WITH MINERALS) TABS tablet Take 1 tablet by mouth daily.     diclofenac Sodium (VOLTAREN) 1 % GEL Apply 4 g topically 4 (four) times daily. Apply to affected areas 4 times daily as needed for pain. (Patient not taking: Reported on 05/05/2023) 100 g 2   furosemide (LASIX) 40 MG tablet Take 1 tablet (40 mg total) by mouth daily. (Patient not taking: Reported on 05/05/2023) 7 tablet 0   lactulose (CHRONULAC) 10 GM/15ML solution Take 45 mLs (30 g total) by mouth 2 (two) times daily. 8100 mL 3   rifaximin (XIFAXAN) 550 MG TABS tablet Take 1 tablet (550 mg total) by mouth 2 (two) times daily. 180 tablet 3   spironolactone (ALDACTONE) 100 MG tablet Take 1 tablet (100 mg total) by mouth daily. (Patient not taking: Reported on 05/05/2023) 7 tablet 0   No current facility-administered medications for this visit.   Facility-Administered Medications Ordered in Other Visits  Medication Dose Route Frequency Provider Last Rate Last Admin   lactated ringers infusion    Continuous PRN Jodell Cipro, CRNA   New Bag at 03/09/19 0800   lactated ringers infusion    Continuous PRN Jodell Cipro, CRNA   New Bag at 03/09/19 3762   Allergies  Allergen Reactions    Contrast Media [Iodinated Contrast Media] Itching and Swelling    Needs 13-hr prep   Latex Itching, Swelling and Other (See Comments)   Sulfa Antibiotics Itching, Swelling and Other (See Comments)    No breathing impairment     Review of Systems: All systems reviewed and negative except where noted in HPI.   Lab Results  Component Value Date   WBC 4.6 11/06/2022   HGB 13.9 11/06/2022   HCT 44.9 11/06/2022   MCV 83.1 11/06/2022   PLT 115 (L) 11/06/2022    Lab Results  Component Value Date   NA 140 01/19/2023   CL 106 01/19/2023   K 3.4 (L) 01/19/2023   CO2 25 01/19/2023   BUN 9 01/19/2023   CREATININE 0.72 01/19/2023   GFR 92.40 01/19/2023   CALCIUM 9.3 01/19/2023   PHOS 3.5 10/30/2021   ALBUMIN 3.4 (L) 11/06/2022   GLUCOSE 125 (H) 01/19/2023    Lab Results  Component Value Date   ALT 38 11/06/2022   AST 53 (H) 11/06/2022   ALKPHOS 146 (H) 11/06/2022   BILITOT 1.4 (H) 11/06/2022    Lab Results  Component Value Date   INR 1.3 (H) 11/18/2022   INR 1.3 (H) 01/02/2022   INR 1.3 (H) 12/12/2021    Physical Exam: BP 130/80   Pulse 68   Ht 5\' 4"  (1.626 m)   Wt 151 lb (68.5 kg)   BMI 25.92 kg/m  Constitutional: Pleasant,well-developed, female in no acute distress..  Pulmonary/chest: Effort normal and breath sounds normal on left, decreased on R base c/w pleural effusion.  Abdominal: Soft, nondistended, nontender. There are no masses palpable. No hepatomegaly. Extremities: no edema Neurological: Alert and oriented to person place and time. Skin: Skin is warm and dry. No rashes noted. Psychiatric: Normal mood and affect. Behavior is normal.   ASSESSMENT: 57  y.o. female here for assessment of the following  1. Alcoholic cirrhosis, unspecified whether ascites present (HCC)   2. Hepatic encephalopathy (HCC)   3. Pleural effusion   4. S/P TIPS (transjugular intrahepatic portosystemic shunt)   5. Alcoholic cirrhosis of liver with ascites (HCC)     Decompensated alcoholic cirrhosis status post TIPS for refractory hepatic hydrothorax, ascites, esophageal varices.  She has done pretty well post TIPS now that she has recovered from that course, no further ascites, no recurrent variceal bleeding, this has kept her out of the hospital.  However, she continues to have persistent effusion over the past several months, and I think present today given her exam.  I previously referred her to pulmonary and she did not follow-up with them noted follow-up with thoracentesis when ordered last August.  She remains short of breath intermittently.  I will recheck the chest x-ray today to assess for interval changes over the past few months.  Ultimately she needs to see pulmonology and will refer her to [facility] pulmonary again.  She is stable at this time, this is a chronic issue. She understands this and agrees to establish with Pulmonary  Otherwise I congratulated her on alcohol abstinence, she has been doing a really good job with this and it is imperative she continue to stay off alcohol to prevent further decompensation.  She is due for basic labs today to recalculate her MELD.  She is due for [facility] screening, will schedule her for right upper quadrant ultrasound.  I will refill her lactulose and rifaximin, sounds like she is compliant with that.  She has not been compliant with diuretics in the past despite numerous trials of this, we will hold off on those for now.   PLAN: - continue alcohol abstinence - refill lactulose and rifaximin - lab today - CBC, CMET, INR, AFP - RUQ [place] for HCC screening - CXR PA /LAT to reassess effusion - referral to Pulmonary for persistent effusion post TIPS  Harlin Rain, MD [facility] Gastroenterology

## 2023-05-05 NOTE — Patient Instructions (Signed)
You have been scheduled for an abdominal ultrasound at [facility] Radiology (1st floor of hospital) on Wednesday, 11-13 at 11:30am. Please arrive 30 minutes prior to your appointment for registration. Make certain not to have anything to eat or drink 6 hours prior to your appointment. Should you need to reschedule your appointment, please contact radiology at [phone]. This test typically takes about 30 minutes to perform.  Please go to the lab in the basement of our building to have lab work done as you leave today. Hit "B" for basement when you get on the elevator.  When the doors open the lab is on your left.  We will call you with the results. Thank you.   Your provider has requested that you have an chest Xray before leaving today. Please go to the basement floor to our Radiology department for the test.   We have sent the following medications to your pharmacy for you to pick up at your convenience: Lactulose Xifaxan  Thank you for entrusting me with your care and for choosing [facility], Dr. Ileene Patrick    If your blood pressure at your visit was 140/90 or greater, please contact your primary care physician to follow up on this. ______________________________________________________  If you are age 58 or older, your body mass index should be between 23-30. Your Body mass index is 25.92 kg/m. If this is out of the aforementioned range listed, please consider follow up with your Primary Care Provider.  If you are age 33 or younger, your body mass index should be between 19-25. Your Body mass index is 25.92 kg/m. If this is out of the aformentioned range listed, please consider follow up with your Primary Care Provider.  ________________________________________________________  The [place] [facility] providers would like to encourage you to use [facility] to communicate with providers for non-urgent requests or questions.  Due to long hold times on the telephone, sending your  provider a message by [facility] may be a faster and more efficient way to get a response.  Please allow 48 business hours for a response.  Please remember that this is for non-urgent requests.  _______________________________________________________  Due to recent changes in healthcare laws, you may see the results of your imaging and laboratory studies on MyChart before your provider has had a chance to review them.  We understand that in some cases there may be results that are confusing or concerning to you. Not all laboratory results come back in the same time frame and the provider may be waiting for multiple results in order to interpret others.  Please give [place] 48 hours in order for your provider to thoroughly review all the results before contacting the office for clarification of your results.

## 2023-05-06 ENCOUNTER — Ambulatory Visit (HOSPITAL_COMMUNITY): Payer: Medicare HMO

## 2023-05-06 ENCOUNTER — Other Ambulatory Visit: Payer: Self-pay | Admitting: *Deleted

## 2023-05-06 DIAGNOSIS — J90 Pleural effusion, not elsewhere classified: Secondary | ICD-10-CM

## 2023-05-06 LAB — CBC WITH DIFFERENTIAL/PLATELET
Basophils Absolute: 0 10*3/uL (ref 0.0–0.1)
Basophils Relative: 0.4 % (ref 0.0–3.0)
Eosinophils Absolute: 0.3 10*3/uL (ref 0.0–0.7)
Eosinophils Relative: 6.7 % — ABNORMAL HIGH (ref 0.0–5.0)
HCT: 43.3 % (ref 36.0–46.0)
Hemoglobin: 14 g/dL (ref 12.0–15.0)
Lymphocytes Relative: 33.5 % (ref 12.0–46.0)
Lymphs Abs: 1.6 10*3/uL (ref 0.7–4.0)
MCHC: 32.3 g/dL (ref 30.0–36.0)
MCV: 82.7 fL (ref 78.0–100.0)
Monocytes Absolute: 0.4 10*3/uL (ref 0.1–1.0)
Monocytes Relative: 7.6 % (ref 3.0–12.0)
Neutro Abs: 2.4 10*3/uL (ref 1.4–7.7)
Neutrophils Relative %: 51.8 % (ref 43.0–77.0)
Platelets: 160 10*3/uL (ref 150.0–400.0)
RBC: 5.23 Mil/uL — ABNORMAL HIGH (ref 3.87–5.11)
RDW: 16.1 % — ABNORMAL HIGH (ref 11.5–15.5)
WBC: 4.7 10*3/uL (ref 4.0–10.5)

## 2023-05-07 LAB — AFP TUMOR MARKER: AFP-Tumor Marker: 2.5 ng/mL

## 2023-05-08 ENCOUNTER — Encounter: Payer: Self-pay | Admitting: *Deleted

## 2023-06-22 ENCOUNTER — Other Ambulatory Visit (HOSPITAL_COMMUNITY): Payer: Self-pay

## 2023-06-22 ENCOUNTER — Telehealth: Payer: Self-pay

## 2023-06-22 NOTE — Telephone Encounter (Signed)
Great, thanks, happy to hear this

## 2023-06-22 NOTE — Telephone Encounter (Signed)
Pharmacy Patient Advocate Encounter   Received notification from CoverMyMeds that prior authorization for Xifaxan 550 mg tablets is required/requested.   Insurance verification completed.   The patient is insured through [facility] .   Per test claim: The current 30 day co-pay is, $0.  No PA needed at this time. This test claim was processed through [facility]- copay amounts may vary at other pharmacies due to pharmacy/plan contracts, or as the patient moves through the different stages of their insurance plan.    Rx was sent for 90 day supply insurance will only pay for 30 day supply at one time.

## 2023-10-27 ENCOUNTER — Telehealth: Payer: Self-pay | Admitting: *Deleted

## 2023-10-27 DIAGNOSIS — K7030 Alcoholic cirrhosis of liver without ascites: Secondary | ICD-10-CM

## 2023-10-27 DIAGNOSIS — J90 Pleural effusion, not elsewhere classified: Secondary | ICD-10-CM

## 2023-10-27 NOTE — Telephone Encounter (Signed)
-----   Message from Nurse Dede Fanny sent at 05/06/2023  1:01 PM EST ----- Pt needs repeat cbc, cmp, inr completed around 11/03/23 (see lab note from 05/06/23); armbruster pt; enter labs and call pt

## 2023-10-27 NOTE — Telephone Encounter (Signed)
 Left message reminding patient she is due for labs for Dr General Kenner. Also advised of location and times she may come for the labs.

## 2023-11-06 ENCOUNTER — Ambulatory Visit (INDEPENDENT_AMBULATORY_CARE_PROVIDER_SITE_OTHER): Payer: 59

## 2023-11-06 VITALS — Ht 64.0 in | Wt 135.0 lb

## 2023-11-06 DIAGNOSIS — Z0000 Encounter for general adult medical examination without abnormal findings: Secondary | ICD-10-CM

## 2023-11-06 DIAGNOSIS — Z1231 Encounter for screening mammogram for malignant neoplasm of breast: Secondary | ICD-10-CM

## 2023-11-06 NOTE — Progress Notes (Signed)
 Subjective:   Katie Holland is a 58 y.o. who presents for a Medicare Wellness preventive visit.  As a reminder, Annual Wellness Visits don't include a physical exam, and some assessments may be limited, especially if this visit is performed virtually. We may recommend an in-person follow-up visit with your provider if needed.  Visit Complete: Virtual I connected with  Brigitt Venne on 11/06/23 by a audio enabled telemedicine application and verified that I am speaking with the correct person using two identifiers.  Patient Location: Home  Provider Location: Home Office  I discussed the limitations of evaluation and management by telemedicine. The patient expressed understanding and agreed to proceed.  Vital Signs: Because this visit was a virtual/telehealth visit, some criteria may be missing or patient reported. Any vitals not documented were not able to be obtained and vitals that have been documented are patient reported.    Persons Participating in Visit: Patient.  AWV Questionnaire: No: Patient Medicare AWV questionnaire was not completed prior to this visit.  Cardiac Risk Factors include: advanced age (>65men, >69 women)     Objective:     Today's Vitals   11/06/23 1058  Weight: 135 lb (61.2 kg)  Height: 5\' 4"  (1.626 m)   Body mass index is 23.17 kg/m.     11/06/2023   11:05 AM 11/06/2022    2:13 PM 10/29/2022   11:27 AM 01/02/2022    8:04 AM 11/19/2021    1:47 PM 11/01/2021    8:14 AM 10/23/2021    8:05 PM  Advanced Directives  Does Patient Have a Medical Advance Directive? No No No No No No   Would patient like information on creating a medical advance directive? No - Patient declined  No - Patient declined No - Patient declined No - Patient declined No - Patient declined No - Patient declined    Current Medications (verified) Outpatient Encounter Medications as of 11/06/2023  Medication Sig   diclofenac  Sodium (VOLTAREN ) 1 % GEL Apply 4 g topically 4 (four) times  daily. Apply to affected areas 4 times daily as needed for pain. (Patient not taking: Reported on 05/05/2023)   furosemide  (LASIX ) 40 MG tablet Take 1 tablet (40 mg total) by mouth daily. (Patient not taking: Reported on 05/05/2023)   lactulose  (CHRONULAC ) 10 GM/15ML solution Take 45 mLs (30 g total) by mouth 2 (two) times daily.   methylPREDNISolone  (MEDROL  DOSEPAK) 4 MG TBPK tablet Take 24 mg on day 1, 20 mg on day 2, 16 mg on day 3, 12 mg on day 4, 8 mg on day 5, 4 mg on day 6.  Take all tablets in each row at once, do not spread tablets out throughout the day.   Multiple Vitamin (MULTIVITAMIN WITH MINERALS) TABS tablet Take 1 tablet by mouth daily.   rifaximin  (XIFAXAN ) 550 MG TABS tablet Take 1 tablet (550 mg total) by mouth 2 (two) times daily.   spironolactone  (ALDACTONE ) 100 MG tablet Take 1 tablet (100 mg total) by mouth daily. (Patient not taking: Reported on 05/05/2023)   Facility-Administered Encounter Medications as of 11/06/2023  Medication   lactated ringers  infusion   lactated ringers  infusion    Allergies (verified) Contrast media [iodinated contrast media], Latex, and Sulfa antibiotics   History: Past Medical History:  Diagnosis Date   Alcoholic liver disease (HCC)    Chronic back pain    on disability   Cirrhosis of liver (HCC)    Esophageal varices (HCC)    GERD (gastroesophageal reflux disease)  Headache    HEPATIC Hydrothorax    S/P tips   Jaundice    Past Surgical History:  Procedure Laterality Date   BACK SURGERY     L3-L4   BIOPSY  08/07/2020   Procedure: BIOPSY;  Surgeon: Ace Holder, MD;  Location: [facility] ENDOSCOPY;  Service: Gastroenterology;;   COLONOSCOPY WITH PROPOFOL  N/A 08/01/2019   Procedure: COLONOSCOPY WITH PROPOFOL ;  Surgeon: Ace Holder, MD;  Location: [facility] ENDOSCOPY;  Service: Gastroenterology;  Laterality: N/A;   ESOPHAGEAL BANDING  04/25/2018   Procedure: ESOPHAGEAL BANDING;  Surgeon: Ace Holder, MD;  Location: [facility]  ENDOSCOPY;  Service: Gastroenterology;;   ESOPHAGEAL BANDING  04/27/2018   Procedure: ESOPHAGEAL BANDING;  Surgeon: Janel Medford, MD;  Location: [facility] ENDOSCOPY;  Service: Endoscopy;;   ESOPHAGEAL BANDING  06/02/2018   Procedure: ESOPHAGEAL BANDING;  Surgeon: Albertina Hugger, MD;  Location: [facility] ENDOSCOPY;  Service: Gastroenterology;;   ESOPHAGEAL BANDING N/A 07/07/2018   Procedure: ESOPHAGEAL BANDING;  Surgeon: Ace Holder, MD;  Location: [facility] ENDOSCOPY;  Service: Gastroenterology;  Laterality: N/A;   ESOPHAGEAL BANDING  07/27/2018   Procedure: ESOPHAGEAL BANDING;  Surgeon: Ace Holder, MD;  Location: [facility] ENDOSCOPY;  Service: Gastroenterology;;   ESOPHAGEAL BANDING  08/30/2018   Procedure: ESOPHAGEAL BANDING;  Surgeon: Ace Holder, MD;  Location: [facility] ENDOSCOPY;  Service: Gastroenterology;;   ESOPHAGOGASTRODUODENOSCOPY N/A 04/27/2018   Procedure: ESOPHAGOGASTRODUODENOSCOPY (EGD);  Surgeon: Janel Medford, MD;  Location[facility] ENDOSCOPY;  Service: Endoscopy;  Laterality: N/A;   ESOPHAGOGASTRODUODENOSCOPY (EGD) WITH PROPOFOL  N/A 04/25/2018   Procedure: ESOPHAGOGASTRODUODENOSCOPY (EGD) WITH PROPOFOL ;  Surgeon: Ace Holder, MD;  Location[facility] ENDOSCOPY;  Service: Gastroenterology;  Laterality: N/A;   ESOPHAGOGASTRODUODENOSCOPY (EGD) WITH PROPOFOL  N/A 06/02/2018   Procedure: ESOPHAGOGASTRODUODENOSCOPY (EGD) WITH PROPOFOL ;  Surgeon: Albertina Hugger, MD;  Location: [facility] ENDOSCOPY;  Service: Gastroenterology;  Laterality: N/A;   ESOPHAGOGASTRODUODENOSCOPY (EGD) WITH PROPOFOL  N/A 07/07/2018   Procedure: ESOPHAGOGASTRODUODENOSCOPY (EGD) WITH PROPOFOL ;  Surgeon: Ace Holder, MD;  Location: [facility] ENDOSCOPY;  Service: Gastroenterology;  Laterality: N/A;   ESOPHAGOGASTRODUODENOSCOPY (EGD) WITH PROPOFOL  N/A 07/27/2018   Procedure: ESOPHAGOGASTRODUODENOSCOPY (EGD) WITH PROPOFOL ;  Surgeon: Ace Holder, MD;  Location: [facility] ENDOSCOPY;  Service: Gastroenterology;  Laterality: N/A;    ESOPHAGOGASTRODUODENOSCOPY (EGD) WITH PROPOFOL  N/A 08/30/2018   Procedure: ESOPHAGOGASTRODUODENOSCOPY (EGD) WITH PROPOFOL ;  Surgeon: Ace Holder, MD;  Location: [facility] ENDOSCOPY;  Service: Gastroenterology;  Laterality: N/A;   ESOPHAGOGASTRODUODENOSCOPY (EGD) WITH PROPOFOL  N/A 11/08/2018   Procedure: ESOPHAGOGASTRODUODENOSCOPY (EGD) WITH PROPOFOL ;  Surgeon: Ace Holder, MD;  Location: [facility] ENDOSCOPY;  Service: Gastroenterology;  Laterality: N/A;   ESOPHAGOGASTRODUODENOSCOPY (EGD) WITH PROPOFOL  N/A 01/31/2019   Procedure: ESOPHAGOGASTRODUODENOSCOPY (EGD) WITH PROPOFOL ;  Surgeon: Ace Holder, MD;  Location: [facility] ENDOSCOPY;  Service: Gastroenterology;  Laterality: N/A;   ESOPHAGOGASTRODUODENOSCOPY (EGD) WITH PROPOFOL  N/A 08/01/2019   Procedure: ESOPHAGOGASTRODUODENOSCOPY (EGD) WITH PROPOFOL ;  Surgeon: Ace Holder, MD;  Location: [facility] ENDOSCOPY;  Service: Gastroenterology;  Laterality: N/A;   ESOPHAGOGASTRODUODENOSCOPY (EGD) WITH PROPOFOL  N/A 08/07/2020   Procedure: ESOPHAGOGASTRODUODENOSCOPY (EGD) WITH PROPOFOL ;  Surgeon: Ace Holder, MD;  Location: [facility] ENDOS[facility];  Service: Gastroenterology;  Laterality: N/A;   ESOPHAGOGASTRODUODENOSCOPY (EGD) WITH PROPOFOL  N/A 08/22/2021   Procedure: ESOPHAGOGASTRODUODENOSCOPY (EGD) WITH PROPOFOL ;  Surgeon: Ace Holder, MD;  Location: [facility] ENDOSCOPY;  Service: Gastroenterology;  Laterality: N/A;   IR EMBO ART  VEN HEMORR LYMPH EXTRAV  INC GUIDE ROADMAPPING  01/02/2022   IR INTRAVASCULAR ULTRASOUND NON CORONARY  11/05/2021   IR PARACENTESIS  04/26/2018   IR PARACENTESIS  05/18/2018   IR PARACENTESIS  05/31/2018   IR PARACENTESIS  06/30/2018   IR PARACENTESIS  08/05/2018   IR PARACENTESIS  08/31/2018   IR PARACENTESIS  10/08/2018   IR PARACENTESIS  10/28/2018   IR PARACENTESIS  02/14/2019   IR PARACENTESIS  03/09/2019   IR PARACENTESIS  04/05/2019   IR PARACENTESIS  05/03/2019   IR PARACENTESIS  06/20/2019   IR PARACENTESIS  07/19/2019    IR PARACENTESIS  08/19/2019   IR PARACENTESIS  09/27/2019   IR PARACENTESIS  10/07/2019   IR PARACENTESIS  10/28/2019   IR PARACENTESIS  11/16/2019   IR PARACENTESIS  12/30/2019   IR PARACENTESIS  01/16/2020   IR PARACENTESIS  02/07/2020   IR PARACENTESIS  03/06/2020   IR PARACENTESIS  04/04/2020   IR PARACENTESIS  04/17/2020   IR PARACENTESIS  05/04/2020   IR PARACENTESIS  06/07/2020   IR PARACENTESIS  06/28/2020   IR PARACENTESIS  07/27/2020   IR PARACENTESIS  08/20/2020   IR PARACENTESIS  09/13/2020   IR PARACENTESIS  10/04/2020   IR PARACENTESIS  10/25/2020   IR PARACENTESIS  11/07/2020   IR PARACENTESIS  11/30/2020   IR PARACENTESIS  12/13/2020   IR PARACENTESIS  01/04/2021   IR PARACENTESIS  01/25/2021   IR PARACENTESIS  02/20/2021   IR PARACENTESIS  03/20/2021   IR PARACENTESIS  04/18/2021   IR PARACENTESIS  05/14/2021   IR PARACENTESIS  06/10/2021   IR PARACENTESIS  06/27/2021   IR PARACENTESIS  07/17/2021   IR PARACENTESIS  08/12/2021   IR PARACENTESIS  08/23/2021   IR PARACENTESIS  09/13/2021   IR PARACENTESIS  10/02/2021   IR PARACENTESIS  10/22/2021   IR PARACENTESIS  11/01/2021   IR PARACENTESIS  11/29/2021   IR PARACENTESIS  12/18/2021   IR PARACENTESIS  01/02/2022   IR RADIOLOGIST EVAL & MGMT  12/23/2018   IR RADIOLOGIST EVAL & MGMT  10/11/2021   IR RADIOLOGIST EVAL & MGMT  12/27/2021   IR THORACENTESIS ASP PLEURAL SPACE W/IMG GUIDE  12/10/2018   IR THORACENTESIS ASP PLEURAL SPACE W/IMG GUIDE  01/21/2019   IR THORACENTESIS ASP PLEURAL SPACE W/IMG GUIDE  10/23/2021   IR THORACENTESIS ASP PLEURAL SPACE W/IMG GUIDE  10/25/2021   IR THORACENTESIS ASP PLEURAL SPACE W/IMG GUIDE  11/01/2021   IR THORACENTESIS ASP PLEURAL SPACE W/IMG GUIDE  01/03/2022   IR THORACENTESIS ASP PLEURAL SPACE W/IMG GUIDE  11/20/2022   IR THROMBECT VENO MECH MOD SED  01/02/2022   IR TIPS  11/01/2021   IR TIPS REVISION MOD SED  01/02/2022   IR TRANSCATH RETRIEVAL FB INCL GUIDANCE (MS)  01/02/2022   IR US  GUIDE VASC ACCESS RIGHT  11/01/2021    IR US  GUIDE VASC ACCESS RIGHT  11/01/2021   IR US  GUIDE VASC ACCESS RIGHT  11/01/2021   IR US  GUIDE VASC ACCESS RIGHT  01/02/2022   RADIOLOGY WITH ANESTHESIA N/A 11/01/2021   Procedure: TIPS;  Surgeon: Roxie Cord, MD;  Location: [facility] OR;  Service: Radiology;  Laterality: N/A;   Family History  Problem Relation Age of Onset   Colon cancer Neg Hx    Esophageal cancer Neg Hx    Pancreatic cancer Neg Hx    Stomach cancer Neg Hx    Social History   Socioeconomic History   Marital status: Married    Spouse name: Not on file   Number of children: 8   Years of education: Not on  file   Highest education level: Not on file  Occupational History   Occupation: disability  Tobacco Use   Smoking status: Former    Current packs/day: 0.00    Average packs/day: 0.3 packs/day for 20.0 years (5.0 ttl pk-yrs)    Types: Cigarettes    Start date: 07/24/2002    Quit date: 07/24/2022    Years since quitting: 1.2   Smokeless tobacco: Never   Tobacco comments:    5 a day  Vaping Use   Vaping status: Former  Substance and Sexual Activity   Alcohol use: Not Currently   Drug use: Not Currently   Sexual activity: Not Currently  Other Topics Concern   Not on file  Social History Narrative   Not on file   Social Drivers of Health   Financial Resource Strain: Low Risk  (11/06/2023)   Overall Financial Resource Strain (CARDIA)    Difficulty of Paying Living Expenses: Not hard at all  Food Insecurity: No Food Insecurity (11/06/2023)   Hunger Vital Sign    Worried About Running Out of Food in the Last Year: Never true    Ran Out of Food in the Last Year: Never true  Transportation Needs: No Transportation Needs (11/06/2023)   PRAPARE - Administrator, Civil Service (Medical): No    Lack of Transportation (Non-Medical): No  Physical Activity: Insufficiently Active (11/06/2023)   Exercise Vital Sign    Days of Exercise per Week: 3 days    Minutes of Exercise per Session: 10 min   Stress: No Stress Concern Present (11/06/2023)   [employer] of Occupational Health - Occupational Stress Questionnaire    Feeling of Stress : Not at all  Social Connections: Socially Integrated (11/06/2023)   Social Connection and Isolation Panel [NHANES]    Frequency of Communication with Friends and Family: More than three times a week    Frequency of Social Gatherings with Friends and Family: More than three times a week    Attends Religious Services: More than 4 times per year    Active Member of [employer] or Organizations: Yes    Attends Engineer, structural: More than 4 times per year    Marital Status: Married    Tobacco Counseling Counseling given: Yes Tobacco comments: 5 a day    Clinical Intake:  Pre-visit preparation completed: Yes  Pain : No/denies pain     BMI - recorded: 23.17 Nutritional Status: BMI of 19-24  Normal Nutritional Risks: None Diabetes: No  No results found for: "HGBA1C"   How often do you need to have someone help you when you read instructions, pamphlets, or other written materials from your doctor or pharmacy?: 1 - Never  Interpreter Needed?: No  Information entered by :: Farris Hong LPN   Activities of Daily Living     11/06/2023   11:04 AM  In your present state of health, do you have any difficulty performing the following activities:  Hearing? 0  Vision? 0  Difficulty concentrating or making decisions? 0  Walking or climbing stairs? 0  Dressing or bathing? 0  Doing errands, shopping? 0  Preparing Food and eating ? N  Using the Toilet? N  In the past six months, have you accidently leaked urine? N  Do you have problems with loss of bowel control? N  Managing your Medications? N  Managing your Finances? N  Housekeeping or managing your Housekeeping? N    Patient Care Team: Viola Greulich, MD as  PCP - General (Family Medicine)  Indicate any recent Medical Services you may have received from other than [facility]  providers in the past year (date may be approximate).     Assessment:    This is a routine wellness examination for [place].  Hearing/Vision screen Hearing Screening - Comments:: Denies hearing difficulties   Vision Screening - Comments:: Wears rx glasses - up to date with routine eye exams with  [facility]    Goals Addressed               This Visit's Progress     Increase physical activity (pt-stated)         Depression Screen      11/06/2023   11:03 AM 10/29/2022   11:22 AM 06/09/2022    2:39 PM 03/17/2022    4:10 PM 12/18/2020    1:52 PM  PHQ 2/9 Scores  PHQ - 2 Score 0 0 0  0  Exception Documentation    Patient refusal     Fall Risk     11/06/2023   11:04 AM 10/29/2022   11:24 AM 12/18/2020    1:55 PM  Fall Risk   Falls in the past year? 0 1 0  Number falls in past yr: 0 0 0  Injury with Fall? 0 1 0  Comment  Swelling to rt hip. Followed by medical attention   Risk for fall due to : No Fall Risks Orthopedic patient Impaired vision;Impaired balance/gait;Impaired mobility  Follow up Falls prevention discussed;Falls evaluation completed Falls prevention discussed Falls prevention discussed    MEDICARE RISK AT HOME:  Medicare Risk at Home Any stairs in or around the home?: No If so, are there any without handrails?: No Home free of loose throw rugs in walkways, pet beds, electrical cords, etc?: Yes Adequate lighting in your home to reduce risk of falls?: Yes Life alert?: No Use of a cane, walker or w/c?: No Grab bars in the bathroom?: Yes Shower chair or bench in shower?: Yes Elevated toilet seat or a handicapped toilet?: No  TIMED UP AND GO:  Was the test performed?  No  Cognitive Function: 6CIT completed        11/06/2023   11:05 AM 10/29/2022   11:27 AM 12/18/2020    1:58 PM  6CIT Screen  What Year? 0 points 0 points 4 points  What month? 0 points 0 points 0 points  What time? 0 points 0 points 0 points  Count back from 20 0 points 0 points 0  points  Months in reverse 0 points 0 points 0 points  Repeat phrase 0 points 0 points 2 points  Total Score 0 points 0 points 6 points    Immunizations Immunization History  Administered Date(s) Administered   Hepatitis A, Adult 06/29/2018, 12/29/2018   PFIZER(Purple Top)SARS-COV-2 Vaccination 12/02/2019, 12/23/2019, 06/20/2020   PNEUMOCOCCAL CONJUGATE-20 10/26/2021    Screening Tests Health Maintenance  Topic Date Due   DTaP/Tdap/Td (1 - Tdap) Never done   Zoster Vaccines- Shingrix (1 of 2) Never done   Cervical Cancer Screening (HPV/Pap Cotest)  Never done   MAMMOGRAM  Never done   COVID-19 Vaccine (4 - 2024-25 season) 02/22/2023   INFLUENZA VACCINE  01/22/2024   Medicare Annual Wellness (AWV)  11/05/2024   Colonoscopy  07/31/2029   Pneumococcal Vaccine 31-77 Years old  Completed   Hepatitis C Screening  Completed   HIV Screening  Completed   HPV VACCINES  Aged Out   Meningococcal B  Vaccine  Aged Out    Health Maintenance  Health Maintenance Due  Topic Date Due   DTaP/Tdap/Td (1 - Tdap) Never done   Zoster Vaccines- Shingrix (1 of 2) Never done   Cervical Cancer Screening (HPV/Pap Cotest)  Never done   MAMMOGRAM  Never done   COVID-19 Vaccine (4 - 2024-25 season) 02/22/2023   Health Maintenance Items Addressed: Mammogram ordered  Additional Screening:  Vision Screening: Recommended annual ophthalmology exams for early detection of glaucoma and other disorders of the eye.  Dental Screening: Recommended annual dental exams for proper oral hygiene  Community Resource Referral / Chronic Care Management: CRR required this visit?  No   CCM required this visit?  No   Plan:    I have personally reviewed and noted the following in the patient's chart:   Medical and social history Use of alcohol, tobacco or illicit drugs  Current medications and supplements including opioid prescriptions. Patient is not currently taking opioid prescriptions. Functional ability  and status Nutritional status Physical activity Advanced directives List of other physicians Hospitalizations, surgeries, and ER visits in previous 12 months Vitals Screenings to include cognitive, depression, and falls Referrals and appointments  In addition, I have reviewed and discussed with patient certain preventive protocols, quality metrics, and best practice recommendations. A written personalized care plan for preventive services as well as general preventive health recommendations were provided to patient.   Dewayne Ford, LPN   [id number]   After Visit Summary: (MyChart) Due to this being a telephonic visit, the after visit summary with patients personalized plan was offered to patient via MyChart   Notes: Nothing significant to report at this time.

## 2023-11-06 NOTE — Patient Instructions (Addendum)
 Katie Holland , Thank you for taking time out of your busy schedule to complete your Annual Wellness Visit with me. I enjoyed our conversation and look forward to speaking with you again next year. I, as well as your care team,  appreciate your ongoing commitment to your health goals. Please review the following plan we discussed and let me know if I can assist you in the future. Your Game plan/ To Do List    Referrals: If you haven't heard from the office you've been referred to, please reach out to them at the phone provided.   Follow up Visits: Next Medicare AWV with our clinical staff: 11/18/24 @ 11:20a   Have you seen your provider in the last 6 months (3 months if uncontrolled diabetes)? Yes 12/30/22 Next Office Visit with your provider:   Clinician Recommendations:  Aim for 30 minutes of exercise or brisk walking, 6-8 glasses of water, and 5 servings of fruits and vegetables each day.       This is a list of the screening recommended for you and due dates:  Health Maintenance  Topic Date Due   DTaP/Tdap/Td vaccine (1 - Tdap) Never done   Zoster (Shingles) Vaccine (1 of 2) Never done   Pap with HPV screening  Never done   Mammogram  Never done   COVID-19 Vaccine (4 - 2024-25 season) 02/22/2023   Flu Shot  01/22/2024   Medicare Annual Wellness Visit  11/05/2024   Colon Cancer Screening  07/31/2029   Pneumococcal Vaccination  Completed   Hepatitis C Screening  Completed   HIV Screening  Completed   HPV Vaccine  Aged Out   Meningitis B Vaccine  Aged Out    Advanced directives: (Declined) Advance directive discussed with you today. Even though you declined this today, please call our office should you change your mind, and we can give you the proper paperwork for you to fill out. Advance Care Planning is important because it:  [x]  Makes sure you receive the medical care that is consistent with your values, goals, and preferences  [x]  It provides guidance to your family and loved ones  and reduces their decisional burden about whether or not they are making the right decisions based on your wishes.  Follow the link provided in your after visit summary or read over the paperwork we have mailed to you to help you started getting your Advance Directives in place. If you need assistance in completing these, please reach out to us  so that we can help you!  See attachments for Preventive Care and Fall Prevention Tips.

## 2023-11-27 ENCOUNTER — Ambulatory Visit

## 2023-12-04 ENCOUNTER — Ambulatory Visit

## 2023-12-10 ENCOUNTER — Ambulatory Visit
Admission: RE | Admit: 2023-12-10 | Discharge: 2023-12-10 | Discharge status: Home / Self Care | Source: Ambulatory Visit | Attending: Family Medicine

## 2023-12-10 DIAGNOSIS — Z1231 Encounter for screening mammogram for malignant neoplasm of breast: Secondary | ICD-10-CM

## 2024-07-19 ENCOUNTER — Ambulatory Visit (HOSPITAL_COMMUNITY)
Admission: EM | Admit: 2024-07-19 | Discharge: 2024-07-19 | Disposition: A | Discharge status: Home / Self Care | Attending: Family Medicine | Admitting: Family Medicine

## 2024-07-19 ENCOUNTER — Encounter (HOSPITAL_COMMUNITY): Payer: Self-pay

## 2024-07-19 DIAGNOSIS — T7840XA Allergy, unspecified, initial encounter: Secondary | ICD-10-CM

## 2024-07-19 DIAGNOSIS — L299 Pruritus, unspecified: Secondary | ICD-10-CM | POA: Diagnosis not present

## 2024-07-19 MED ORDER — DEXAMETHASONE SOD PHOSPHATE PF 10 MG/ML IJ SOLN
INTRAMUSCULAR | Status: AC
  Filled 2024-07-19: qty 1

## 2024-07-19 MED ORDER — FAMOTIDINE 20 MG PO TABS
ORAL_TABLET | ORAL | Status: AC
  Filled 2024-07-19: qty 1

## 2024-07-19 MED ORDER — FAMOTIDINE 20 MG PO TABS
20.0000 mg | ORAL_TABLET | Freq: Once | ORAL | Status: AC
  Administered 2024-07-19: 20 mg via ORAL

## 2024-07-19 MED ORDER — FAMOTIDINE 20 MG PO TABS: 20.0000 mg | ORAL_TABLET | Freq: Two times a day (BID) | ORAL | 0 refills | Status: AC

## 2024-07-19 MED ORDER — CETIRIZINE HCL 10 MG PO TABS
10.0000 mg | ORAL_TABLET | Freq: Every day | ORAL | Status: DC
  Administered 2024-07-19: 10 mg via ORAL

## 2024-07-19 MED ORDER — PREDNISONE 20 MG PO TABS: 40.0000 mg | ORAL_TABLET | Freq: Every day | ORAL | 0 refills | Status: AC

## 2024-07-19 MED ORDER — DEXAMETHASONE SOD PHOSPHATE PF 10 MG/ML IJ SOLN
10.0000 mg | Freq: Once | INTRAMUSCULAR | Status: AC
  Administered 2024-07-19: 10 mg via INTRAMUSCULAR

## 2024-07-19 MED ORDER — CETIRIZINE HCL 10 MG PO TABS
ORAL_TABLET | ORAL | Status: AC
  Filled 2024-07-19: qty 1

## 2024-07-19 NOTE — ED Triage Notes (Signed)
 Pt presents with complaints of allergic reaction. Pt ate tacos yesterday that she noticed has ants in it. She thinks she might be allergic to ants. Reports facial swelling, emesis x 1, and shortness of breath.

## 2024-07-19 NOTE — ED Provider Notes (Signed)
 " [facility]    CSN: [id number] Arrival date & time: 07/19/24  1655      History   Chief Complaint Chief Complaint  Patient presents with   Allergic Reaction    HPI Katie Holland is a 59 y.o. female.    Allergic Reaction  Here for swelling sensation in her throat and tongue and itching all over.  No fever.  She has maybe had a little cough since this started.   the symptoms began yesterday immediately after eating a taco and finding that it had ants in it   She has never been known to be allergic to ants previously.  She is allergic to sulfa and latex and IV contrast  Past medical history includes cirrhosis, but she has not had to have a paracentesis in a long while  Past medical history is negative for diabetes Past Medical History:  Diagnosis Date   Alcoholic liver disease    Chronic back pain    on disability   Cirrhosis of liver (HCC)    Esophageal varices (HCC)    GERD (gastroesophageal reflux disease)    Headache    HEPATIC Hydrothorax    S/P tips   Jaundice     Patient Active Problem List   Diagnosis Date Noted   Hepatic encephalopathy (HCC) 04/23/2022   Pneumothorax on right 11/04/2021   Elevated lactic acid level 10/23/2021   Sepsis (HCC) 10/22/2021   Acute respiratory failure with hypoxia (HCC) 10/22/2021   CAP (community acquired pneumonia) 10/22/2021   Increased ammonia level 10/22/2021   Elevated troponin 10/22/2021   Abnormal TSH 10/22/2021   Adenomatous duodenal polyp    Gastritis and gastroduodenitis    Colon cancer screening    Encephalopathy, hepatic (HCC) 05/06/2019   Ascites 01/20/2019   Pleural effusion 01/20/2019   UTI (urinary tract infection) 01/20/2019   Acute upper GI bleeding 06/02/2018   Alcohol dependence syndrome (HCC) 06/02/2018   Decompensated hepatic cirrhosis (HCC)    Esophageal varices (HCC)    Alcoholic liver disease    Protein-calorie malnutrition, severe 04/26/2018   Bleeding esophageal varices (HCC)     Abdominal pain 04/24/2018   Hematemesis 04/24/2018   Hematochezia 04/24/2018   Alcohol abuse 04/24/2018   Hyponatremia 04/24/2018   Hypokalemia 04/24/2018   Metabolic acidosis, normal anion gap (NAG) 04/24/2018   History of GI bleed 10/26/2017   Alcoholic cirrhosis of liver with ascites (HCC) 10/26/2017   Tobacco dependence 10/26/2017    Past Surgical History:  Procedure Laterality Date   BACK SURGERY     L3-L4   BIOPSY  08/07/2020   Procedure: BIOPSY;  Surgeon: Leigh Elspeth SQUIBB, MD;  Location: [facility];  Service: Gastroenterology;;   COLONOSCOPY WITH PROPOFOL  N/A 08/01/2019   Procedure: COLONOSCOPY WITH PROPOFOL ;  Surgeon: Leigh Elspeth SQUIBB, MD;  Location: [facility];  Service: Gastroenterology;  Laterality: N/A;   ESOPHAGEAL BANDING  04/25/2018   Procedure: ESOPHAGEAL BANDING;  Surgeon: Leigh Elspeth SQUIBB, MD;  Location: [facility] ENDOSCOPY;  Service: Gastroenterology;;   ESOPHAGEAL BANDING  04/27/2018   Procedure: ESOPHAGEAL BANDING;  Surgeon: Teressa Toribio SQUIBB, MD;  Location: [facility] ENDOSCOPY;  Service: Endoscopy;;   ESOPHAGEAL BANDING  06/02/2018   Procedure: ESOPHAGEAL BANDING;  Surgeon: Legrand Victory LITTIE DOUGLAS, MD;  Location: [facility] ENDOSCOPY;  Service: Gastroenterology;;   ESOPHAGEAL BANDING N/A 07/07/2018   Procedure: ESOPHAGEAL BANDING;  Surgeon: Leigh Elspeth SQUIBB, MD;  Location: [facility];  Service: Gastroenterology;  Laterality: N/A;   ESOPHAGEAL BANDING  07/27/2018   Procedure:  ESOPHAGEAL BANDING;  Surgeon: Leigh Elspeth SQUIBB, MD;  Location: [facility];  Service: Gastroenterology;;   ESOPHAGEAL BANDING  08/30/2018   Procedure: ESOPHAGEAL BANDING;  Surgeon: Leigh Elspeth SQUIBB, MD;  Location: [facility];  Service: Gastroenterology;;   ESOPHAGOGASTRODUODENOSCOPY N/A 04/27/2018   Procedure: ESOPHAGOGASTRODUODENOSCOPY (EGD);  Surgeon: Teressa Toribio SQUIBB, MD;  Location: [facility] ENDOSCOPY;  Service: Endoscopy;  Laterality: N/A;   ESOPHAGOGASTRODUODENOSCOPY (EGD) WITH PROPOFOL  N/A  04/25/2018   Procedure: ESOPHAGOGASTRODUODENOSCOPY (EGD) WITH PROPOFOL ;  Surgeon: Leigh Elspeth SQUIBB, MD;  Location: [facility] ENDOSCOPY;  Service: Gastroenterology;  Laterality: N/A;   ESOPHAGOGASTRODUODENOSCOPY (EGD) WITH PROPOFOL  N/A 06/02/2018   Procedure: ESOPHAGOGASTRODUODENOSCOPY (EGD) WITH PROPOFOL ;  Surgeon: Legrand Victory LITTIE DOUGLAS, MD;  Location: [facility] ENDOSCOPY;  Service: Gastroenterology;  Laterality: N/A;   ESOPHAGOGASTRODUODENOSCOPY (EGD) WITH PROPOFOL  N/A 07/07/2018   Procedure: ESOPHAGOGASTRODUODENOSCOPY (EGD) WITH PROPOFOL ;  Surgeon: Leigh Elspeth SQUIBB, MD;  Location: [facility];  Service: Gastroenterology;  Laterality: N/A;   ESOPHAGOGASTRODUODENOSCOPY (EGD) WITH PROPOFOL  N/A 07/27/2018   Procedure: ESOPHAGOGASTRODUODENOSCOPY (EGD) WITH PROPOFOL ;  Surgeon: Leigh Elspeth SQUIBB, MD;  Location: [facility];  Service: Gastroenterology;  Laterality: N/A;   ESOPHAGOGASTRODUODENOSCOPY (EGD) WITH PROPOFOL  N/A 08/30/2018   Procedure: ESOPHAGOGASTRODUODENOSCOPY (EGD) WITH PROPOFOL ;  Surgeon: Leigh Elspeth SQUIBB, MD;  Location: [facility];  Service: Gastroenterology;  Laterality: N/A;   ESOPHAGOGASTRODUODENOSCOPY (EGD) WITH PROPOFOL  N/A 11/08/2018   Procedure: ESOPHAGOGASTRODUODENOSCOPY (EGD) WITH PROPOFOL ;  Surgeon: Leigh Elspeth SQUIBB, MD;  Location: [facility];  Service: Gastroenterology;  Laterality: N/A;   ESOPHAGOGASTRODUODENOSCOPY (EGD) WITH PROPOFOL  N/A 01/31/2019   Procedure: ESOPHAGOGASTRODUODENOSCOPY (EGD) WITH PROPOFOL ;  Surgeon: Leigh Elspeth SQUIBB, MD;  Location: [facility];  Service: Gastroenterology;  Laterality: N/A;   ESOPHAGOGASTRODUODENOSCOPY (EGD) WITH PROPOFOL  N/A 08/01/2019   Procedure: ESOPHAGOGASTRODUODENOSCOPY (EGD) WITH PROPOFOL ;  Surgeon: Leigh Elspeth SQUIBB, MD;  Location: [facility];  Service: Gastroenterology;  Laterality: N/A;   ESOPHAGOGASTRODUODENOSCOPY (EGD) WITH PROPOFOL  N/A 08/07/2020   Procedure: ESOPHAGOGASTRODUODENOSCOPY (EGD) WITH PROPOFOL ;  Surgeon:  Leigh Elspeth SQUIBB, MD;  Location: [facility];  Service: Gastroenterology;  Laterality: N/A;   ESOPHAGOGASTRODUODENOSCOPY (EGD) WITH PROPOFOL  N/A 08/22/2021   Procedure: ESOPHAGOGASTRODUODENOSCOPY (EGD) WITH PROPOFOL ;  Surgeon: Leigh Elspeth SQUIBB, MD;  Location: [facility];  Service: Gastroenterology;  Laterality: N/A;   IR EMBO ART  VEN HEMORR LYMPH EXTRAV  INC GUIDE ROADMAPPING  01/02/2022   IR INTRAVASCULAR ULTRASOUND NON CORONARY  11/05/2021   IR PARACENTESIS  04/26/2018   IR PARACENTESIS  05/18/2018   IR PARACENTESIS  05/31/2018   IR PARACENTESIS  06/30/2018   IR PARACENTESIS  08/05/2018   IR PARACENTESIS  08/31/2018   IR PARACENTESIS  10/08/2018   IR PARACENTESIS  10/28/2018   IR PARACENTESIS  02/14/2019   IR PARACENTESIS  03/09/2019   IR PARACENTESIS  04/05/2019   IR PARACENTESIS  05/03/2019   IR PARACENTESIS  06/20/2019   IR PARACENTESIS  07/19/2019   IR PARACENTESIS  08/19/2019   IR PARACENTESIS  09/27/2019   IR PARACENTESIS  10/07/2019   IR PARACENTESIS  10/28/2019   IR PARACENTESIS  11/16/2019   IR PARACENTESIS  12/30/2019   IR PARACENTESIS  01/16/2020   IR PARACENTESIS  02/07/2020   IR PARACENTESIS  03/06/2020   IR PARACENTESIS  04/04/2020   IR PARACENTESIS  04/17/2020   IR PARACENTESIS  05/04/2020   IR PARACENTESIS  06/07/2020   IR PARACENTESIS  06/28/2020   IR PARACENTESIS  07/27/2020   IR PARACENTESIS  08/20/2020   IR PARACENTESIS  09/13/2020   IR PARACENTESIS  10/04/2020   IR PARACENTESIS  10/25/2020   IR PARACENTESIS  11/07/2020   IR PARACENTESIS  11/30/2020   IR PARACENTESIS  12/13/2020   IR PARACENTESIS  01/04/2021   IR PARACENTESIS  01/25/2021   IR PARACENTESIS  02/20/2021   IR PARACENTESIS  03/20/2021   IR PARACENTESIS  04/18/2021   IR PARACENTESIS  05/14/2021   IR PARACENTESIS  06/10/2021   IR PARACENTESIS  06/27/2021   IR PARACENTESIS  07/17/2021   IR PARACENTESIS  08/12/2021   IR PARACENTESIS  08/23/2021   IR PARACENTESIS  09/13/2021   IR PARACENTESIS  10/02/2021   IR PARACENTESIS   10/22/2021   IR PARACENTESIS  11/01/2021   IR PARACENTESIS  11/29/2021   IR PARACENTESIS  12/18/2021   IR PARACENTESIS  01/02/2022   IR RADIOLOGIST EVAL & MGMT  12/23/2018   IR RADIOLOGIST EVAL & MGMT  10/11/2021   IR RADIOLOGIST EVAL & MGMT  12/27/2021   IR THORACENTESIS RIGHT ASP PLEURAL SPACE W/IMG GUIDE  12/10/2018   IR THORACENTESIS RIGHT ASP PLEURAL SPACE W/IMG GUIDE  01/21/2019   IR THORACENTESIS RIGHT ASP PLEURAL SPACE W/IMG GUIDE  10/23/2021   IR THORACENTESIS RIGHT ASP PLEURAL SPACE W/IMG GUIDE  10/25/2021   IR THORACENTESIS RIGHT ASP PLEURAL SPACE W/IMG GUIDE  11/01/2021   IR THORACENTESIS RIGHT ASP PLEURAL SPACE W/IMG GUIDE  01/03/2022   IR THORACENTESIS RIGHT ASP PLEURAL SPACE W/IMG GUIDE  11/20/2022   IR THROMBECT VENO MECH MOD SED  01/02/2022   IR TIPS  11/01/2021   IR TIPS REVISION MOD SED  01/02/2022   IR TRANSCATH RETRIEVAL FB INCL GUIDANCE (MS)  01/02/2022   IR US  GUIDE VASC ACCESS RIGHT  11/01/2021   IR US  GUIDE VASC ACCESS RIGHT  11/01/2021   IR US  GUIDE VASC ACCESS RIGHT  11/01/2021   IR US  GUIDE VASC ACCESS RIGHT  01/02/2022   RADIOLOGY WITH ANESTHESIA N/A 11/01/2021   Procedure: TIPS;  Surgeon: Karalee Wilkie POUR, MD;  Location: [facility] OR;  Service: Radiology;  Laterality: N/A;    OB History   No obstetric history on file.      Home Medications    Prior to Admission medications  Medication Sig Start Date End Date Taking? Authorizing Provider  famotidine  (PEPCID ) 20 MG tablet Take 1 tablet (20 mg total) by mouth 2 (two) times daily. 07/19/24  Yes Vonna Sharlet POUR, MD  predniSONE  (DELTASONE ) 20 MG tablet Take 2 tablets (40 mg total) by mouth daily with breakfast for 5 days. 07/19/24 07/24/24 Yes Vonna Sharlet POUR, MD  furosemide  (LASIX ) 40 MG tablet Take 1 tablet (40 mg total) by mouth daily. Patient not taking: Reported on 05/05/2023 01/09/23   Leigh Elspeth SQUIBB, MD  lactulose  (CHRONULAC ) 10 GM/15ML solution Take 45 mLs (30 g total) by mouth 2 (two) times daily. 05/05/23   Armbruster,  Elspeth SQUIBB, MD  Multiple Vitamin (MULTIVITAMIN WITH MINERALS) TABS tablet Take 1 tablet by mouth daily. 04/25/22   Cheryle Page, MD  rifaximin  (XIFAXAN ) 550 MG TABS tablet Take 1 tablet (550 mg total) by mouth 2 (two) times daily. 05/05/23   Armbruster, Elspeth SQUIBB, MD  spironolactone  (ALDACTONE ) 100 MG tablet Take 1 tablet (100 mg total) by mouth daily. Patient not taking: Reported on 05/05/2023 01/09/23   Leigh Elspeth SQUIBB, MD    Family History Family History  Problem Relation Age of Onset   Colon cancer Neg Hx    Esophageal cancer Neg Hx    Pancreatic cancer Neg Hx    Stomach cancer Neg Hx  Social History Social History[1]   Allergies   Contrast media [iodinated contrast media], Latex, and Sulfa antibiotics   Review of Systems Review of Systems   Physical Exam Triage Vital Signs ED Triage Vitals  Encounter Vitals Group     BP 07/19/24 1659 (!) 147/83     Girls Systolic BP Percentile --      Girls Diastolic BP Percentile --      Boys Systolic BP Percentile --      Boys Diastolic BP Percentile --      Pulse Rate 07/19/24 1659 (!) 109     Resp 07/19/24 1659 18     Temp 07/19/24 1659 98.2 F (36.8 C)     Temp src --      SpO2 07/19/24 1659 94 %     Weight --      Height --      Head Circumference --      Peak Flow --      Pain Score 07/19/24 1658 0     Pain Loc --      Pain Education --      Exclude from Growth Chart --    No data found.  Updated Vital Signs BP (!) 147/83   Pulse (!) 109   Temp 98.2 F (36.8 C)   Resp 18   SpO2 94%   Visual Acuity Right Eye Distance:   Left Eye Distance:   Bilateral Distance:    Right Eye Near:   Left Eye Near:    Bilateral Near:     Physical Exam Vitals reviewed.  Constitutional:      General: She is not in acute distress.    Appearance: She is not ill-appearing, toxic-appearing or diaphoretic.  HENT:     Head:     Comments: There is possible mild puffiness of the cheeks and upper eyelids.    Nose: Nose  normal.     Mouth/Throat:     Mouth: Mucous membranes are moist.     Pharynx: No oropharyngeal exudate or posterior oropharyngeal erythema.     Comments: Oropharynx is benign and airway appears patent.  There is no obvious swelling of the lips or tongue. Eyes:     General: No scleral icterus.    Extraocular Movements: Extraocular movements intact.     Conjunctiva/sclera: Conjunctivae normal.     Pupils: Pupils are equal, round, and reactive to light.  Cardiovascular:     Rate and Rhythm: Normal rate and regular rhythm.     Heart sounds: No murmur heard. Pulmonary:     Effort: Pulmonary effort is normal. No respiratory distress.     Breath sounds: Normal breath sounds. No stridor. No wheezing, rhonchi or rales.  Musculoskeletal:     Cervical back: Neck supple.  Lymphadenopathy:     Cervical: No cervical adenopathy.  Skin:    Coloration: Skin is not jaundiced or pale.  Neurological:     General: No focal deficit present.     Mental Status: She is alert and oriented to person, place, and time.  Psychiatric:        Behavior: Behavior normal.      UC Treatments / Results  Labs (all labs ordered are listed, but only abnormal results are displayed) Labs Reviewed - No data to display  EKG   Radiology No results found.  Procedures Procedures (including critical care time)  Medications Ordered in UC Medications  cetirizine  (ZYRTEC ) tablet 10 mg (10 mg Oral Given 07/19/24 1713)  dexamethasone  (  DECADRON ) injection 10 mg (10 mg Intramuscular Given 07/19/24 1713)  famotidine  (PEPCID ) tablet 20 mg (20 mg Oral Given 07/19/24 1713)    Initial Impression / Assessment and Plan / UC Course  I have reviewed the triage vital signs and the nursing notes.  Pertinent labs & imaging results that were available during my care of the patient were reviewed by me and considered in my medical decision making (see chart for details).     Overall her symptoms are consistent with an allergic  reaction, uncertain of what.  Decadron , Zyrtec  oral and Pepcid  oral are provided here.  30 minutes later she is starting to feel better with less itching and she feels like her throat does not feel as swollen.  Repeat exam is benign and with less puffiness in her face.  5-day burst of prednisone  and Pepcid  are sent to the pharmacy.  She will also get some Zyrtec  to take at home. Final Clinical Impressions(s) / UC Diagnoses   Final diagnoses:  Itching  Allergic reaction, initial encounter     Discharge Instructions      You have been given a shot of dexamethasone  10 mg, steroid, 20 mg famotidine /Pepcid  tablet and 10 mg of Zyrtec /cetirizine  tablet here in the clinic.  Take prednisone  20 mg--2 daily for 5 days  Take famotidine  20 mg--1 tablet 2 times daily.    Zyrtec /cetirizine  10 mg tablet over-the-counter--take 1 daily as needed for allergy or itching.  If you worsen in any way, please go to the emergency room for further evaluation  Follow-up with your primary care    .    ED Prescriptions     Medication Sig Dispense Auth. Provider   famotidine  (PEPCID ) 20 MG tablet Take 1 tablet (20 mg total) by mouth 2 (two) times daily. 30 tablet Hajer Dwyer K, MD   predniSONE  (DELTASONE ) 20 MG tablet Take 2 tablets (40 mg total) by mouth daily with breakfast for 5 days. 10 tablet Vonna Tera Pellicane K, MD      PDMP not reviewed this encounter.    [1]  Social History Tobacco Use   Smoking status: Former    Current packs/day: 0.00    Average packs/day: 0.3 packs/day for 20.0 years (5.0 ttl pk-yrs)    Types: Cigarettes    Start date: 07/24/2002    Quit date: 07/24/2022    Years since quitting: 1.9   Smokeless tobacco: Never   Tobacco comments:    5 a day  Vaping Use   Vaping status: Former  Substance Use Topics   Alcohol use: Not Currently   Drug use: Not Currently     Vonna Sharlet POUR, MD 07/19/24 1754  "

## 2024-07-19 NOTE — Discharge Instructions (Signed)
 You have been given a shot of dexamethasone  10 mg, steroid, 20 mg famotidine /Pepcid  tablet and 10 mg of Zyrtec /cetirizine  tablet here in the clinic.  Take prednisone  20 mg--2 daily for 5 days  Take famotidine  20 mg--1 tablet 2 times daily.    Zyrtec /cetirizine  10 mg tablet over-the-counter--take 1 daily as needed for allergy or itching.  If you worsen in any way, please go to the emergency room for further evaluation  Follow-up with your primary care    .

## 2024-07-28 ENCOUNTER — Emergency Department (HOSPITAL_COMMUNITY)
Admission: EM | Admit: 2024-07-28 | Discharge: 2024-07-28 | Discharge status: Against Medical Advice | Attending: Emergency Medicine | Admitting: Emergency Medicine

## 2024-07-28 ENCOUNTER — Other Ambulatory Visit: Payer: Self-pay

## 2024-07-28 ENCOUNTER — Emergency Department (HOSPITAL_COMMUNITY)

## 2024-07-28 ENCOUNTER — Encounter (HOSPITAL_COMMUNITY): Payer: Self-pay

## 2024-07-28 DIAGNOSIS — Z5321 Procedure and treatment not carried out due to patient leaving prior to being seen by health care provider: Secondary | ICD-10-CM | POA: Insufficient documentation

## 2024-07-28 DIAGNOSIS — M7989 Other specified soft tissue disorders: Secondary | ICD-10-CM | POA: Insufficient documentation

## 2024-07-28 LAB — COMPREHENSIVE METABOLIC PANEL WITH GFR
ALT: 43 U/L (ref 0–44)
AST: 68 U/L — ABNORMAL HIGH (ref 15–41)
Albumin: 2.4 g/dL — ABNORMAL LOW (ref 3.5–5.0)
Alkaline Phosphatase: 186 U/L — ABNORMAL HIGH (ref 38–126)
Anion gap: 12 (ref 5–15)
BUN: 8 mg/dL (ref 6–20)
CO2: 22 mmol/L (ref 22–32)
Calcium: 7.9 mg/dL — ABNORMAL LOW (ref 8.9–10.3)
Chloride: 105 mmol/L (ref 98–111)
Creatinine, Ser: 0.61 mg/dL (ref 0.44–1.00)
GFR, Estimated: >60 mL/min
Glucose, Bld: 115 mg/dL — ABNORMAL HIGH (ref 70–99)
Potassium: 2.8 mmol/L — ABNORMAL LOW (ref 3.5–5.1)
Sodium: 139 mmol/L (ref 135–145)
Total Bilirubin: 10.4 mg/dL — ABNORMAL HIGH (ref 0.0–1.2)
Total Protein: 5.3 g/dL — ABNORMAL LOW (ref 6.5–8.1)

## 2024-07-28 LAB — CBC
HCT: 25.9 % — ABNORMAL LOW (ref 36.0–46.0)
Hemoglobin: 7.7 g/dL — ABNORMAL LOW (ref 12.0–15.0)
MCH: 28.4 pg (ref 26.0–34.0)
MCHC: 29.7 g/dL — ABNORMAL LOW (ref 30.0–36.0)
MCV: 95.6 fL (ref 80.0–100.0)
Platelets: 39 10*3/uL — ABNORMAL LOW (ref 150–400)
RBC: 2.71 MIL/uL — ABNORMAL LOW (ref 3.87–5.11)
RDW: 16.5 % — ABNORMAL HIGH (ref 11.5–15.5)
WBC: 5.3 10*3/uL (ref 4.0–10.5)
nRBC: 0 % (ref 0.0–0.2)

## 2024-07-28 LAB — PRO BRAIN NATRIURETIC PEPTIDE: Pro Brain Natriuretic Peptide: 123 pg/mL

## 2024-07-28 MED ORDER — POTASSIUM CHLORIDE CRYS ER 20 MEQ PO TBCR: 40.0000 meq | EXTENDED_RELEASE_TABLET | Freq: Once | ORAL | Status: DC

## 2024-07-28 NOTE — ED Triage Notes (Signed)
 Pt came in via POV d/t waking up this morning & her legs were swollen. States that she has cirrhosis of the liver & is concerned the meds that she has been taking for it is the cause. A/Ox4, rates her pain 8/10 during triage.

## 2024-07-28 NOTE — ED Provider Triage Note (Signed)
 Emergency Medicine Provider Triage Evaluation Note  Katie Holland , a 59 y.o. female  was evaluated in triage.  Pt complains of bilateral lower extremity swelling.  She is a history of hepatic disease.  Patient also is completing course of steroids, H2 blocker that she started about a week ago after suspected allergic reaction.  No chest pain, abdominal pain, dyspnea, fever or other complaints.  Review of Systems  Positive: Bilateral lower extremity swelling Negative: Chest pain, dyspnea  Physical Exam  BP 129/67   Pulse (!) 103   Temp 98.6 F (37 C) (Oral)   Resp 18   Ht 1.626 m (5' 4)   Wt 59 kg   SpO2 92%   BMI 22.31 kg/m  Gen:   Awake, no distress adult female speaking clearly Resp:  Normal effort  MSK:   Moves extremities without difficulty, bilateral lower extremity swelling Other:  Neuro grossly intact  Medical Decision Making  Medically screening exam initiated at 8:32 AM.  Appropriate orders placed.  Katie Holland was informed that the remainder of the evaluation will be completed by another provider, this initial triage assessment does not replace that evaluation, and the importance of remaining in the ED until their evaluation is complete.   Katie Charleston, MD 07/28/24 [phone]

## 2024-11-18 ENCOUNTER — Ambulatory Visit
